# Patient Record
Sex: Male | Born: 1935 | Race: White | Hispanic: No | Smoking: Former smoker
Health system: Southern US, Community
[De-identification: ages and names within clinical notes are randomized; demographics above are authoritative.]

## PROBLEM LIST (undated history)

## (undated) DIAGNOSIS — I4891 Unspecified atrial fibrillation: Secondary | ICD-10-CM

## (undated) DIAGNOSIS — Z860100 Personal history of colon polyps, unspecified: Secondary | ICD-10-CM

## (undated) DIAGNOSIS — D49519 Neoplasm of unspecified behavior of unspecified kidney: Secondary | ICD-10-CM

## (undated) DIAGNOSIS — C73 Malignant neoplasm of thyroid gland: Secondary | ICD-10-CM

## (undated) DIAGNOSIS — J189 Pneumonia, unspecified organism: Secondary | ICD-10-CM

## (undated) DIAGNOSIS — N138 Other obstructive and reflux uropathy: Secondary | ICD-10-CM

## (undated) DIAGNOSIS — T8859XA Other complications of anesthesia, initial encounter: Secondary | ICD-10-CM

## (undated) DIAGNOSIS — G20 Parkinson's disease: Secondary | ICD-10-CM

## (undated) DIAGNOSIS — M1990 Unspecified osteoarthritis, unspecified site: Secondary | ICD-10-CM

## (undated) DIAGNOSIS — G459 Transient cerebral ischemic attack, unspecified: Secondary | ICD-10-CM

## (undated) DIAGNOSIS — M109 Gout, unspecified: Secondary | ICD-10-CM

## (undated) DIAGNOSIS — I639 Cerebral infarction, unspecified: Secondary | ICD-10-CM

## (undated) DIAGNOSIS — T4145XA Adverse effect of unspecified anesthetic, initial encounter: Secondary | ICD-10-CM

## (undated) DIAGNOSIS — S065X9A Traumatic subdural hemorrhage with loss of consciousness of unspecified duration, initial encounter: Secondary | ICD-10-CM

## (undated) DIAGNOSIS — I10 Essential (primary) hypertension: Secondary | ICD-10-CM

## (undated) DIAGNOSIS — T884XXA Failed or difficult intubation, initial encounter: Secondary | ICD-10-CM

## (undated) DIAGNOSIS — Z7901 Long term (current) use of anticoagulants: Secondary | ICD-10-CM

## (undated) DIAGNOSIS — N401 Enlarged prostate with lower urinary tract symptoms: Secondary | ICD-10-CM

## (undated) DIAGNOSIS — C4491 Basal cell carcinoma of skin, unspecified: Secondary | ICD-10-CM

## (undated) DIAGNOSIS — E039 Hypothyroidism, unspecified: Secondary | ICD-10-CM

## (undated) DIAGNOSIS — C4492 Squamous cell carcinoma of skin, unspecified: Secondary | ICD-10-CM

## (undated) DIAGNOSIS — G20A1 Parkinson's disease without dyskinesia, without mention of fluctuations: Secondary | ICD-10-CM

## (undated) DIAGNOSIS — E785 Hyperlipidemia, unspecified: Secondary | ICD-10-CM

## (undated) HISTORY — DX: Essential (primary) hypertension: I10

## (undated) HISTORY — PX: SKIN CANCER EXCISION: SHX779

## (undated) HISTORY — PX: APPENDECTOMY: SHX54

## (undated) HISTORY — DX: Cerebral infarction, unspecified: I63.9

## (undated) HISTORY — PX: KNEE ARTHROSCOPY: SUR90

## (undated) HISTORY — DX: Squamous cell carcinoma of skin, unspecified: C44.92

## (undated) HISTORY — PX: THYROIDECTOMY: SHX17

## (undated) HISTORY — DX: Transient cerebral ischemic attack, unspecified: G45.9

## (undated) HISTORY — PX: TONSILLECTOMY: SUR1361

## (undated) HISTORY — DX: Other obstructive and reflux uropathy: N13.8

## (undated) HISTORY — PX: KIDNEY SURGERY: SHX687

## (undated) HISTORY — DX: Traumatic subdural hemorrhage with loss of consciousness of unspecified duration, initial encounter: S06.5X9A

## (undated) HISTORY — DX: Personal history of colonic polyps: Z86.010

## (undated) HISTORY — PX: EYE SURGERY: SHX253

## (undated) HISTORY — PX: BACK SURGERY: SHX140

## (undated) HISTORY — PX: CATARACT EXTRACTION W/ INTRAOCULAR LENS  IMPLANT, BILATERAL: SHX1307

## (undated) HISTORY — PX: INGUINAL HERNIA REPAIR: SUR1180

## (undated) HISTORY — DX: Unspecified osteoarthritis, unspecified site: M19.90

## (undated) HISTORY — DX: Hyperlipidemia, unspecified: E78.5

## (undated) HISTORY — DX: Benign prostatic hyperplasia with lower urinary tract symptoms: N40.1

## (undated) HISTORY — DX: Malignant neoplasm of thyroid gland: C73

## (undated) HISTORY — PX: CHOLECYSTECTOMY: SHX55

## (undated) HISTORY — DX: Personal history of colon polyps, unspecified: Z86.0100

---

## 1998-05-18 ENCOUNTER — Other Ambulatory Visit: Admission: RE | Admit: 1998-05-18 | Discharge: 1998-05-18 | Payer: Self-pay | Admitting: Specialist

## 2000-09-02 ENCOUNTER — Encounter: Payer: Self-pay | Admitting: Pulmonary Disease

## 2000-09-02 ENCOUNTER — Ambulatory Visit (HOSPITAL_COMMUNITY): Admission: RE | Admit: 2000-09-02 | Discharge: 2000-09-02 | Payer: Self-pay | Admitting: Pulmonary Disease

## 2000-09-03 ENCOUNTER — Encounter: Payer: Self-pay | Admitting: Pulmonary Disease

## 2000-09-08 ENCOUNTER — Encounter: Payer: Self-pay | Admitting: Pulmonary Disease

## 2000-09-08 ENCOUNTER — Encounter (INDEPENDENT_AMBULATORY_CARE_PROVIDER_SITE_OTHER): Payer: Self-pay | Admitting: Specialist

## 2000-09-08 ENCOUNTER — Ambulatory Visit (HOSPITAL_COMMUNITY): Admission: RE | Admit: 2000-09-08 | Discharge: 2000-09-08 | Payer: Self-pay | Admitting: Pulmonary Disease

## 2000-09-29 ENCOUNTER — Encounter: Payer: Self-pay | Admitting: Surgery

## 2000-10-02 ENCOUNTER — Encounter (INDEPENDENT_AMBULATORY_CARE_PROVIDER_SITE_OTHER): Payer: Self-pay | Admitting: Specialist

## 2000-10-02 ENCOUNTER — Inpatient Hospital Stay (HOSPITAL_COMMUNITY): Admission: RE | Admit: 2000-10-02 | Discharge: 2000-10-04 | Payer: Self-pay | Admitting: Surgery

## 2001-04-19 ENCOUNTER — Emergency Department (HOSPITAL_COMMUNITY): Admission: EM | Admit: 2001-04-19 | Discharge: 2001-04-19 | Payer: Self-pay | Admitting: Emergency Medicine

## 2001-04-19 ENCOUNTER — Encounter: Payer: Self-pay | Admitting: Emergency Medicine

## 2004-02-22 ENCOUNTER — Ambulatory Visit: Payer: Self-pay | Admitting: Pulmonary Disease

## 2004-04-15 DIAGNOSIS — S065X9A Traumatic subdural hemorrhage with loss of consciousness of unspecified duration, initial encounter: Secondary | ICD-10-CM

## 2004-04-15 DIAGNOSIS — S065XAA Traumatic subdural hemorrhage with loss of consciousness status unknown, initial encounter: Secondary | ICD-10-CM

## 2004-04-15 HISTORY — PX: SUBDURAL HEMATOMA EVACUATION VIA CRANIOTOMY: SUR319

## 2004-04-15 HISTORY — DX: Traumatic subdural hemorrhage with loss of consciousness status unknown, initial encounter: S06.5XAA

## 2004-04-15 HISTORY — DX: Traumatic subdural hemorrhage with loss of consciousness of unspecified duration, initial encounter: S06.5X9A

## 2004-11-11 ENCOUNTER — Inpatient Hospital Stay (HOSPITAL_COMMUNITY): Admission: EM | Admit: 2004-11-11 | Discharge: 2004-11-16 | Payer: Self-pay | Admitting: *Deleted

## 2004-11-12 ENCOUNTER — Encounter (INDEPENDENT_AMBULATORY_CARE_PROVIDER_SITE_OTHER): Payer: Self-pay | Admitting: Specialist

## 2004-11-13 ENCOUNTER — Ambulatory Visit: Payer: Self-pay | Admitting: Pulmonary Disease

## 2004-11-26 ENCOUNTER — Ambulatory Visit (HOSPITAL_COMMUNITY): Admission: RE | Admit: 2004-11-26 | Discharge: 2004-11-26 | Payer: Self-pay | Admitting: Neurological Surgery

## 2004-12-10 ENCOUNTER — Ambulatory Visit: Payer: Self-pay | Admitting: Pulmonary Disease

## 2004-12-11 ENCOUNTER — Ambulatory Visit (HOSPITAL_COMMUNITY): Admission: RE | Admit: 2004-12-11 | Discharge: 2004-12-11 | Payer: Self-pay | Admitting: Neurosurgery

## 2005-01-14 ENCOUNTER — Ambulatory Visit (HOSPITAL_COMMUNITY): Admission: RE | Admit: 2005-01-14 | Discharge: 2005-01-14 | Payer: Self-pay | Admitting: Neurosurgery

## 2005-02-06 ENCOUNTER — Ambulatory Visit: Payer: Self-pay | Admitting: Pulmonary Disease

## 2005-02-12 ENCOUNTER — Ambulatory Visit: Payer: Self-pay | Admitting: Pulmonary Disease

## 2005-02-18 ENCOUNTER — Ambulatory Visit: Payer: Self-pay | Admitting: Pulmonary Disease

## 2005-04-24 ENCOUNTER — Ambulatory Visit: Payer: Self-pay | Admitting: Gastroenterology

## 2005-05-09 ENCOUNTER — Ambulatory Visit: Payer: Self-pay | Admitting: Gastroenterology

## 2005-05-20 ENCOUNTER — Ambulatory Visit: Payer: Self-pay | Admitting: Pulmonary Disease

## 2005-05-21 ENCOUNTER — Ambulatory Visit: Payer: Self-pay | Admitting: Pulmonary Disease

## 2005-09-24 ENCOUNTER — Ambulatory Visit: Payer: Self-pay | Admitting: Pulmonary Disease

## 2005-09-27 ENCOUNTER — Ambulatory Visit: Payer: Self-pay | Admitting: Pulmonary Disease

## 2006-02-05 ENCOUNTER — Ambulatory Visit: Payer: Self-pay | Admitting: Pulmonary Disease

## 2006-03-25 ENCOUNTER — Ambulatory Visit: Payer: Self-pay | Admitting: Pulmonary Disease

## 2006-03-25 LAB — CONVERTED CEMR LAB
ALT: 26 units/L (ref 0–40)
AST: 21 units/L (ref 0–37)
Albumin: 4 g/dL (ref 3.5–5.2)
Alkaline Phosphatase: 78 units/L (ref 39–117)
BUN: 11 mg/dL (ref 6–23)
Cholesterol: 137 mg/dL (ref 0–200)
Creatinine, Ser: 1.1 mg/dL (ref 0.4–1.5)
HDL: 47.1 mg/dL (ref >39.0)
LDL Cholesterol: 70 mg/dL (ref 0–99)
Potassium: 3.9 meq/L (ref 3.5–5.1)
Triglyceride fasting, serum: 101 mg/dL (ref 0–149)
VLDL: 20 mg/dL (ref 0–40)

## 2006-03-26 ENCOUNTER — Ambulatory Visit: Payer: Self-pay | Admitting: Pulmonary Disease

## 2006-07-15 ENCOUNTER — Ambulatory Visit: Payer: Self-pay | Admitting: Pulmonary Disease

## 2007-01-30 ENCOUNTER — Ambulatory Visit: Payer: Self-pay | Admitting: Pulmonary Disease

## 2007-04-22 DIAGNOSIS — G459 Transient cerebral ischemic attack, unspecified: Secondary | ICD-10-CM | POA: Insufficient documentation

## 2007-04-22 DIAGNOSIS — M1990 Unspecified osteoarthritis, unspecified site: Secondary | ICD-10-CM | POA: Insufficient documentation

## 2007-04-22 DIAGNOSIS — I10 Essential (primary) hypertension: Secondary | ICD-10-CM | POA: Insufficient documentation

## 2007-04-22 DIAGNOSIS — I7091 Generalized atherosclerosis: Secondary | ICD-10-CM | POA: Insufficient documentation

## 2007-04-22 DIAGNOSIS — E785 Hyperlipidemia, unspecified: Secondary | ICD-10-CM | POA: Insufficient documentation

## 2007-07-31 ENCOUNTER — Telehealth: Payer: Self-pay | Admitting: Pulmonary Disease

## 2007-09-15 ENCOUNTER — Ambulatory Visit: Payer: Self-pay | Admitting: Internal Medicine

## 2007-10-28 ENCOUNTER — Telehealth (INDEPENDENT_AMBULATORY_CARE_PROVIDER_SITE_OTHER): Payer: Self-pay | Admitting: *Deleted

## 2007-10-29 ENCOUNTER — Ambulatory Visit: Payer: Self-pay | Admitting: Pulmonary Disease

## 2007-10-30 ENCOUNTER — Telehealth (INDEPENDENT_AMBULATORY_CARE_PROVIDER_SITE_OTHER): Payer: Self-pay | Admitting: *Deleted

## 2007-12-10 ENCOUNTER — Ambulatory Visit: Payer: Self-pay | Admitting: Pulmonary Disease

## 2007-12-10 DIAGNOSIS — C73 Malignant neoplasm of thyroid gland: Secondary | ICD-10-CM | POA: Insufficient documentation

## 2007-12-10 DIAGNOSIS — N400 Enlarged prostate without lower urinary tract symptoms: Secondary | ICD-10-CM | POA: Insufficient documentation

## 2007-12-10 DIAGNOSIS — C4490 Unspecified malignant neoplasm of skin, unspecified: Secondary | ICD-10-CM | POA: Insufficient documentation

## 2007-12-10 DIAGNOSIS — I6200 Nontraumatic subdural hemorrhage, unspecified: Secondary | ICD-10-CM | POA: Insufficient documentation

## 2007-12-10 LAB — CONVERTED CEMR LAB
ALT: 17 units/L (ref 0–53)
BUN: 16 mg/dL (ref 6–23)
Basophils Absolute: 0 10*3/uL (ref 0.0–0.1)
CO2: 34 meq/L — ABNORMAL HIGH (ref 19–32)
Calcium: 8.6 mg/dL (ref 8.4–10.5)
Chloride: 97 meq/L (ref 96–112)
Cholesterol: 196 mg/dL (ref 0–200)
Eosinophils Absolute: 0.2 10*3/uL (ref 0.0–0.7)
Glucose, Bld: 100 mg/dL — ABNORMAL HIGH (ref 70–99)
HCT: 44.4 % (ref 39.0–52.0)
HDL: 48.7 mg/dL (ref >39.0)
LDL Cholesterol: 128 mg/dL — ABNORMAL HIGH (ref 0–99)
Monocytes Absolute: 0.4 10*3/uL (ref 0.1–1.0)
Neutrophils Relative %: 57.2 % (ref 43.0–77.0)
Platelets: 165 10*3/uL (ref 150–400)
Potassium: 3.9 meq/L (ref 3.5–5.1)
RBC: 4.7 M/uL (ref 4.22–5.81)
Total Bilirubin: 1.1 mg/dL (ref 0.3–1.2)
Total Protein: 6.8 g/dL (ref 6.0–8.3)
WBC: 3.4 10*3/uL — ABNORMAL LOW (ref 4.5–10.5)

## 2008-01-06 ENCOUNTER — Ambulatory Visit: Payer: Self-pay | Admitting: Pulmonary Disease

## 2008-08-03 ENCOUNTER — Telehealth: Payer: Self-pay | Admitting: Pulmonary Disease

## 2008-09-26 ENCOUNTER — Telehealth: Payer: Self-pay | Admitting: Pulmonary Disease

## 2008-10-04 ENCOUNTER — Telehealth (INDEPENDENT_AMBULATORY_CARE_PROVIDER_SITE_OTHER): Payer: Self-pay | Admitting: *Deleted

## 2008-10-06 ENCOUNTER — Telehealth: Payer: Self-pay | Admitting: Pulmonary Disease

## 2008-10-24 ENCOUNTER — Telehealth: Payer: Self-pay | Admitting: Pulmonary Disease

## 2008-10-27 ENCOUNTER — Telehealth: Payer: Self-pay | Admitting: Pulmonary Disease

## 2008-10-28 ENCOUNTER — Ambulatory Visit: Payer: Self-pay | Admitting: Pulmonary Disease

## 2008-11-07 ENCOUNTER — Ambulatory Visit: Payer: Self-pay | Admitting: Pulmonary Disease

## 2008-11-07 LAB — CONVERTED CEMR LAB
ALT: 14 units/L (ref 0–53)
AST: 19 units/L (ref 0–37)
Albumin: 3.9 g/dL (ref 3.5–5.2)
Alkaline Phosphatase: 70 units/L (ref 39–117)
Basophils Absolute: 0 10*3/uL (ref 0.0–0.1)
Basophils Relative: 0.5 % (ref 0.0–3.0)
CO2: 34 meq/L — ABNORMAL HIGH (ref 19–32)
Calcium: 8.3 mg/dL — ABNORMAL LOW (ref 8.4–10.5)
Chloride: 104 meq/L (ref 96–112)
Cholesterol: 135 mg/dL (ref 0–200)
Creatinine, Ser: 1.3 mg/dL (ref 0.4–1.5)
Eosinophils Absolute: 0.2 10*3/uL (ref 0.0–0.7)
HCT: 45.7 % (ref 39.0–52.0)
Hemoglobin, Urine: NEGATIVE
LDL Cholesterol: 73 mg/dL (ref 0–99)
Leukocytes, UA: NEGATIVE
Lymphocytes Relative: 26.3 % (ref 12.0–46.0)
Lymphs Abs: 1.1 10*3/uL (ref 0.7–4.0)
Neutro Abs: 2.4 10*3/uL (ref 1.4–7.7)
Nitrite: NEGATIVE
Platelets: 167 10*3/uL (ref 150.0–400.0)
Potassium: 4.1 meq/L (ref 3.5–5.1)
RBC: 4.84 M/uL (ref 4.22–5.81)
RDW: 12.8 % (ref 11.5–14.6)
TSH: 2.95 microintl units/mL (ref 0.35–5.50)
Total CHOL/HDL Ratio: 3
Total Protein, Urine: NEGATIVE mg/dL
Triglycerides: 47 mg/dL (ref 0.0–149.0)
WBC: 4.2 10*3/uL — ABNORMAL LOW (ref 4.5–10.5)
pH: 6 (ref 5.0–8.0)

## 2009-01-27 ENCOUNTER — Ambulatory Visit: Payer: Self-pay | Admitting: Pulmonary Disease

## 2009-05-09 ENCOUNTER — Ambulatory Visit: Payer: Self-pay | Admitting: Pulmonary Disease

## 2009-06-16 ENCOUNTER — Ambulatory Visit: Payer: Self-pay | Admitting: Pulmonary Disease

## 2009-06-16 ENCOUNTER — Telehealth (INDEPENDENT_AMBULATORY_CARE_PROVIDER_SITE_OTHER): Payer: Self-pay | Admitting: *Deleted

## 2009-10-17 ENCOUNTER — Encounter: Admission: RE | Admit: 2009-10-17 | Discharge: 2009-10-17 | Payer: Self-pay | Admitting: Neurology

## 2009-10-27 ENCOUNTER — Telehealth (INDEPENDENT_AMBULATORY_CARE_PROVIDER_SITE_OTHER): Payer: Self-pay | Admitting: *Deleted

## 2009-10-31 ENCOUNTER — Ambulatory Visit: Payer: Self-pay | Admitting: Pulmonary Disease

## 2009-10-31 LAB — CONVERTED CEMR LAB
BUN: 19 mg/dL (ref 6–23)
Basophils Absolute: 0 10*3/uL (ref 0.0–0.1)
Basophils Relative: 1.1 % (ref 0.0–3.0)
CO2: 34 meq/L — ABNORMAL HIGH (ref 19–32)
Chloride: 104 meq/L (ref 96–112)
Creatinine, Ser: 1.3 mg/dL (ref 0.4–1.5)
Eosinophils Absolute: 0.1 10*3/uL (ref 0.0–0.7)
Eosinophils Relative: 3.4 % (ref 0.0–5.0)
GFR calc non Af Amer: 57.54 mL/min (ref >60)
Glucose, Bld: 101 mg/dL — ABNORMAL HIGH (ref 70–99)
HCT: 41.8 % (ref 39.0–52.0)
MCHC: 34.7 g/dL (ref 30.0–36.0)
MCV: 93.2 fL (ref 78.0–100.0)
Platelets: 159 10*3/uL (ref 150.0–400.0)
Potassium: 4.1 meq/L (ref 3.5–5.1)
RBC: 4.48 M/uL (ref 4.22–5.81)
Sodium: 142 meq/L (ref 135–145)

## 2009-11-30 ENCOUNTER — Ambulatory Visit: Payer: Self-pay | Admitting: Pulmonary Disease

## 2009-12-02 LAB — CONVERTED CEMR LAB
ALT: 14 units/L (ref 0–53)
AST: 18 units/L (ref 0–37)
Albumin: 4.4 g/dL (ref 3.5–5.2)
Alkaline Phosphatase: 54 units/L (ref 39–117)
TSH: 1.2 microintl units/mL (ref 0.35–5.50)
Total CHOL/HDL Ratio: 3

## 2010-01-31 ENCOUNTER — Ambulatory Visit: Payer: Self-pay | Admitting: Pulmonary Disease

## 2010-05-15 NOTE — Progress Notes (Signed)
Summary: low bp  Phone Note Call from Patient   Caller: Patient Call For: nadel Summary of Call: pt c/o low bp. requests to speak to nurse. today's readings: 80 / 43 at 10:00am/ 103 / 57 at 2:00pm today. pt says that dr love changed some of his meds and since pt is on HYZAAR he thinks he may need to change this. call cell [phone] Initial call taken by: Tivis Ringer, CNA,  October 27, 2009 2:26 PM  Follow-up for Phone Call        Called, spoke with pt.  Pt states Dr. Sandria Manly started him on Mirapex 0.125mg .  Pt is currently up to four tablets daily-taking 2 im am, 1 at noon, and 1 at bedtime.  States since he has started taking 4 tablets, he has noticed BP is "significantly lower than normal."  States he is also having wakness and feeling like he is going to pass out while standing since on 4 tablets.  BP at 10am today 109/59, 1130 80/43, 2:00pm 103/57.  Pt would like to know if he should eliminate or decrease either the hyzaar or cardura.   Will forward to Tp in the absence of SN.  Pls advise.  Thanks! Follow-up by: Gweneth Dimitri RN,  October 27, 2009 3:02 PM  Additional Follow-up for Phone Call Additional follow up Details #1::        hold hyzaar, check b/p daily , restart when b/p is >140/90.  ov next week to evaluate.  if b/p is still low, will need to stop mirapex.  if not improving will need to go to ER.  increase fluds. Additional Follow-up by: Rubye Oaks NP,  October 27, 2009 3:06 PM    Additional Follow-up for Phone Call Additional follow up Details #2::    Called, spoke with pt.  Pt informed of the above recs per TP.  OV scheduled with TP for July 19 at 1145.  Pt advised to bring BP log with him to OV. He verbalized understanding of all instructions.   Follow-up by: Gweneth Dimitri RN,  October 27, 2009 3:20 PM

## 2010-05-15 NOTE — Assessment & Plan Note (Signed)
Summary: Acute NP office visit - poss gout   CC:  states woke up this morning with sharp pain in the left great toe that has become intermittent since.  pt denies any redness/swelling.Marland Kitchen  History of Present Illness: 77  year old male with known history of DJD  June 16, 2009--Presents for an acute office visit. Complains of toe pain. States he woke up this morning with sharp pain in the left great toe that has become intermittent since.  pt denies any redness/swelling. left great toe. Recently went for pedicure. Denies chest pain, dyspnea, orthopnea, hemoptysis, fever, n/v/d, edema, headache.   Medications Prior to Update: 1)  Bayer Low Strength 81 Mg  Tbec (Aspirin) .... Take 1 Tablet By Mouth Once A Day 2)  Hyzaar 100-25 Mg  Tabs (Losartan Potassium-Hctz) .... Take 1 Tablet By Mouth Once A Day 3)  Cardura 8 Mg  Tabs (Doxazosin Mesylate) .... Take 1/2 Tablet By Mouth Once A Day 4)  Simvastatin 40 Mg Tabs (Simvastatin) .... Take 1 Tab By Mouth At Bedtime 5)  Fish Oil 1000 Mg  Caps (Omega-3 Fatty Acids) .... Take 1 Capsule By Mouth Once A Day 6)  Synthroid 200 Mcg  Tabs (Levothyroxine Sodium) .... Take 1 Tablet By Mouth Once A Day 7)  Dilantin 100 Mg  Caps (Phenytoin Sodium Extended) .... Take 3 Tablets Daily 8)  Cialis 20 Mg Tabs (Tadalafil) .... Use As Directed 9)  Multivitamins   Tabs (Multiple Vitamin) .... Take 1 Tablet By Mouth Once A Day  Current Medications (verified): 1)  Bayer Low Strength 81 Mg  Tbec (Aspirin) .... Take 1 Tablet By Mouth Once A Day 2)  Hyzaar 100-25 Mg  Tabs (Losartan Potassium-Hctz) .... Take 1 Tablet By Mouth Once A Day 3)  Cardura 8 Mg  Tabs (Doxazosin Mesylate) .... Take 1/2 Tablet By Mouth Once A Day 4)  Simvastatin 40 Mg Tabs (Simvastatin) .... Take 1 Tab By Mouth At Bedtime 5)  Fish Oil 1000 Mg  Caps (Omega-3 Fatty Acids) .... Take 1 Capsule By Mouth Once A Day 6)  Synthroid 200 Mcg  Tabs (Levothyroxine Sodium) .... Take 1 Tablet By Mouth Once A Day 7)   Dilantin 100 Mg  Caps (Phenytoin Sodium Extended) .... 2 Capsules By Mouth in The Morning and 1 At Bedtime 8)  Cialis 20 Mg Tabs (Tadalafil) .... Use As Directed 9)  Multivitamins   Tabs (Multiple Vitamin) .... 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) COLONIC POLYPS (ICD-211.3) BENIGN PROSTATIC HYPERTROPHY, WITH OBSTRUCTION (ICD-600.01) TRANSIENT ISCHEMIC ATTACK (ICD-435.9) Hx of SUBDURAL HEMATOMA (ICD-432.1) TREMOR (ICD-781.0) DEGENERATIVE JOINT DISEASE (ICD-715.90) Hx of CARCINOMA, SKIN, SQUAMOUS CELL (ICD-173.9)  Past Surgical History: Last updated: 05/09/2009 S/P cataract surgery S/P cholecystectomy S/P right inguinal hernia repair S/P thyroidectomy for medullary carcinoma  Family History: Last updated: 14-Oct-2007 mother deceased at age 50, stroke father deceased at age 70, alzheimer's bother living at 18 with alzheimer's sister living at 45 with alzheimer's  Social History: Last updated: 12/06/2008 Widow- wife died March 23, 2023 w/ metastatic breast cancer 3 children  former smoker for 60yrs 1 drink daily retired  Risk Factors: Smoking Status: quit (04/22/2007)  Review of Systems      See HPI  Vital Signs:  Patient profile:   77 year old male Height:  76 inches Weight:      190.13 pounds BMI:     23.23 O2 Sat:      98 % on Room air Temp:     97.3 degrees F oral Pulse rate:   56 / minute BP sitting:   126 / 80  (right arm) Cuff size:   regular  Vitals Entered By: Boone Master CNA (June 16, 2009 12:01 PM)  O2 Flow:  Room air CC: states woke up this morning with sharp pain in the left great toe that has become intermittent since.  pt denies any redness/swelling. Is Patient Diabetic? No Comments Medications  reviewed with patient Daytime contact number verified with patient. Boone Master CNA  June 16, 2009 12:01 PM    Physical Exam  Additional Exam:  WD, WN, 77 y/o WM in NAD... GENERAL:  Alert & oriented; pleasant & cooperative... HEENT:  [place]/AT, , EACs-clear, TMs-wnl, NOSE-clear, THROAT-clear & wnl. NECK:  Supple w/ fairROM; no JVD; normal carotid impulses w/o bruits; thyroid area scar, no nodules palp; no lymphadenopathy. CHEST:  Clear to P & A; without wheezes/ rales/ or rhonchi heard... HEART:  Regular Rhythm; without murmurs/ rubs/ or gallops detected... ABDOMEN:  Soft & nontender; normal bowel sounds; no organomegaly or masses palpated... EXT: without deformities, mild arthritic changes; no varicose veins/ +venous insuffic/ no edema, along left great toe, no swelling, along cuticle slightly red, no streaking.      Impression & Recommendations:  Problem # 1:  TOE PAIN (ICD-729.5)  ?localized irritation along cuticle of left great toe REC:  Warm epson salt soaks once daily for 1 week  Place neosporin along cuticle /nail bed once daily for 1 week.  Call back if redness or swelling develops Tylneol or motrin as needed pain Please contact office for sooner follow up if symptoms do not improve or worsen  follow up Dr. Kriste Basque as scheduled.   Orders: Est. Patient Level III (65784)  Medications Added to Medication List This Visit: 1)  Dilantin 100 Mg Caps (Phenytoin sodium extended) .... 2 capsules by mouth in the morning and 1 at bedtime  Patient Instructions: 1)  Warm epson salt soaks once daily for 1 week  2)  Place neosporin along cuticle /nail bed once daily for 1 week.  3)  Call back if redness or swelling develops 4)  Tylneol or motrin as needed pain 5)  Please contact office for sooner follow up if symptoms do not improve or worsen  6)  follow up Dr. Kriste Basque as scheduled.

## 2010-05-15 NOTE — Assessment & Plan Note (Signed)
Summary: f/u 6 months///kp   CC:  6 month ROV & review of mult medalproblems....  History of Present Illness: 77 y/o WM here for a follow up visit... he has multiple medical problems as noted below... followed for HBP, Carotid vasc dis w/ hx of TIA & prev subdural hematoma, Chol, & prev surg for medullary thyroid carcinoma... his wife passed away from metastatic breast cancer 11/09...   ~  November 07, 2008:  here for yearly follow up and refill of meds... states he is doing well, plays golf regularly, notes sl tremor of right hand and arm, hasn't seen DrLove in several yrs and will call for follow up eval... he saw DrRDavis recently w/ incr PSA>5 & Rx w/ Cipro... PSA here is 4.17, and we will send to Urology attention...   ~  May 09, 2009:  6 mo ROV doing well- BP, Chol, Thyroid all doing satis... he saw DrLove w/ tremor right hand & he favored prob early Parkinson's... they decided to watch it- no meds yet...  he slipped on the ice several weeks ago- no apparent injury... had Flu vaccine 10/10, & we discussed PNEUMONIA vaccination today.    Current Problem List:  HYPERTENSION (ICD-401.9) - controlled on HYZAAR 100/25 daily, & CARDURA 8mg - taking 1/2 daily...  BP= 120/72 here & similar at home... feeling well- denies HA, fatigue, visual changes, CP, palipit, dizziness, syncope, dyspnea, edema, etc...  ATHEROSCLEROTIC VASCULAR DISEASE (ICD-440.9) - on ASA 81mg /d... he denies weakness, numbness, speech difficulties, etc...  ~  Eval 2003 by [facility] for TIA showed cerebrovasc dis w/ R>L PCA stenoses...  HYPERLIPIDEMIA (ICD-272.4) - on SIMVASTATIN 40mg /d + FISH OIL 1000mg /d...   ~  FLP 12/07 on Zocor showed TChol 137, HDL 47, LDL 70  ~  FLP 7/09 off med showed TChol 196, TG 95, HDL 49, LDL 128... rec> restart Zocor 40mg .  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 he is s/p thyroidectomy for medullary carcinoma of  the thyroid 6/02 by [facility]...  ~  labs 7/09 w/ TSH= 3.47  ~  labs 7/10 showed TSH= 2.95  COLONIC POLYPS (ICD-211.3) - last colonoscopy 1/07 by DrSam showed divertics & hems only... f/u planned 65yrs.  BENIGN PROSTATIC HYPERTROPHY, WITH OBSTRUCTION (ICD-600.01) - he sees DrDavis for Urology once a year... he has a brother who had prostate cancer...   ~  PSA followed by [facility] yrearly... 7/10 pt reports PSA>5 (was 4.17 here) & Rx w/ Cipro...  ~  ?DrDavis leaving for [facility]- pt will f/u soon & be reassigned.  TRANSIENT ISCHEMIC ATTACK (ICD-435.9) - hx of recurrent right brain TIA's in 2003... initially on ASA + Plavix and the Plavix was stopped after his subdural in 2006...  Hx of SUBDURAL HEMATOMA (ICD-432.1) - hosp 7/06 w/ left subdural hematoma req craniotomy by DrHirsh for evacuation... no known trauma- he was on ASA/ Plavix and developed a headache... he also take DILANTIN 100mg  Tid now per [facility] (for a partial seizure characterized by aphasia)...  ~  7/10: pt tells me he has decreased his Dilantin to Bid and will f/u w/ DrLove.  TREMOR (ICD-781.0) - he notes mild tremor right hand & arm... eval from Neurology- DrLove- indicated prob early [name] but they opted for observation, no meds yet...  DEGENERATIVE JOINT DISEASE (ICD-715.90)  Hx of CARCINOMA, SKIN, SQUAMOUS CELL (ICD-173.9) - s/p removal of skin cancer from his right forearm 11/06 by  DrLupton...    Allergies (verified): No Known Drug Allergies  Comments:  Nurse/Medical Assistant: The patient's medications and allergies were reviewed with the patient and were updated in the Medication and Allergy Lists.  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 mother deceased at age 82, stroke father deceased at age 75, alzheimer's bother living at 30 with alzheimer's sister living at 7 with alzheimer's  Social History: Reviewed history from 11/07/2008 and no changes required. Widow- wife died 03/20/23 w/ metastatic breast cancer 3 children  former smoker for 54yrs 1 drink daily retired  Review of Systems      See HPI  The patient denies anorexia, fever, weight loss, weight gain, vision loss, decreased hearing, hoarseness, chest pain, syncope, dyspnea on exertion, peripheral edema, prolonged cough, headaches, hemoptysis, abdominal pain, melena, hematochezia, severe indigestion/heartburn, hematuria, incontinence, muscle weakness, suspicious skin lesions, transient blindness, difficulty walking, depression, unusual weight change, abnormal bleeding, enlarged lymph nodes, and angioedema.    Vital Signs:  Patient profile:   77 year old male Height:      76 inches Weight:      189.50 pounds BMI:     23.15 O2 Sat:      96 % on Room air Temp:     97.4 degrees F oral Pulse rate:   62 / minute BP sitting:   120 / 72  (left arm) Cuff size:   regular  Vitals Entered By: Randell Loop CMA (May 09, 2009 11:56 AM)  O2 Sat at Rest %:  96 O2 Flow:  Room air CC: 6 month ROV & review of mult medalproblems... Is Patient Diabetic? No Pain Assessment Patient in pain? no      Comments no changes in meds   Physical Exam  Additional Exam:  WD, WN, 77 y/o WM in NAD... GENERAL:  Alert & oriented; pleasant & cooperative... HEENT:  [place]/AT, EOM-wnl, PERRLA, EACs-clear,  TMs-wnl, NOSE-clear, THROAT-clear & wnl. NECK:  Supple w/ fairROM; no JVD; normal carotid impulses w/o bruits; thyroid area scar, no nodules palp; no lymphadenopathy. CHEST:  Clear to P & A; without wheezes/ rales/ or rhonchi heard... HEART:  Regular Rhythm; without murmurs/ rubs/ or gallops detected... ABDOMEN:  Soft & nontender; normal bowel sounds; no organomegaly or masses palpated... EXT: without deformities, mild arthritic changes; no varicose veins/ +venous insuffic/ no edema. NEURO:  CN's intact;  mild tremor right hand & arm when distracted, ?early mask facies, still moves well. DERM:  No lesions noted; no rash etc...     Impression & Recommendations:  Problem # 1:  HYPERTENSION (ICD-401.9) Controlled-  same meds. His updated medication list for this problem includes:    Hyzaar 100-25 Mg Tabs (Losartan potassium-hctz) .Marland Kitchen... Take 1 tablet by mouth once a day    Cardura 8 Mg Tabs (Doxazosin mesylate) .Marland Kitchen... Take 1/2 tablet by mouth once a day  Problem # 2:  ATHEROSCLEROTIC VASCULAR DISEASE (ICD-440.9) He will continue ASA alone...  Problem # 3:  HYPERLIPIDEMIA (ICD-272.4) Stable on Simva40 + Fish Oil... His updated medication list for this problem includes:  Simvastatin 40 Mg Tabs (Simvastatin) .Marland Kitchen... 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  Problem # 5:  BENIGN PROSTATIC HYPERTROPHY, WITH OBSTRUCTION (ICD-600.01) Followed by [facility]... he will check in soon for PSA & DRE... His updated medication list for this problem includes:    Cardura 8 Mg Tabs (Doxazosin mesylate) .Marland Kitchen... Take 1/2 tablet by mouth once a day  Problem # 6:  TREMOR (ICD-781.0) He has seen DrLove- & will call for f/u appt...  Problem # 7:  OTHER MEDICAL PROBLEMS AS NOTED>>>  Complete Medication List: 1)  Bayer Low Strength 81 Mg Tbec (Aspirin) .... Take 1 tablet by mouth once a day 2)  Hyzaar 100-25 Mg Tabs  (Losartan potassium-hctz) .... Take 1 tablet by mouth once a day 3)  Cardura 8 Mg Tabs (Doxazosin mesylate) .... Take 1/2 tablet by mouth once a day 4)  Simvastatin 40 Mg Tabs (Simvastatin) .... Take 1 tab by mouth at bedtime 5)  Fish Oil 1000 Mg Caps (Omega-3 fatty acids) .... Take 1 capsule by mouth once a day 6)  Synthroid 200 Mcg Tabs (Levothyroxine sodium) .... Take 1 tablet by mouth once a day 7)  Dilantin 100 Mg Caps (Phenytoin sodium extended) .... Take 3 tablets daily 8)  Cialis 20 Mg Tabs (Tadalafil) .... Use as directed 9)  Multivitamins Tabs (Multiple vitamin) .... Take 1 tablet by mouth once a day  Patient Instructions: 1)  Today we updated your med list- see below.... 2)  Continue your current meds the same... 3)  Today we gave you a PNEUMOVAX... this is the last one you will need based on the current recommendations. 4)  Call for any problems.Marland KitchenMarland Kitchen 5)  Please schedule a follow-up appointment in 6 months, and we will plan FASTING blood work at that time.

## 2010-05-15 NOTE — Miscellaneous (Signed)
Summary: INJECTION GIVEN AT OV TODAY   Clinical Lists Changes  Orders: Added new Service order of Pneumococcal Vaccine (16109) - Signed Added new Service order of Admin 1st Vaccine (60454) - Signed Observations: Added new observation of PNEUMOVAXVIS: 11/11/95 version given May 09, 2009. (05/09/2009 14:26) Added new observation of PNEUMOVAXLOT: 111OZ (05/09/2009 14:26) Added new observation of PNEUMOVAXEXP: 05/11/2010 (05/09/2009 14:26) Added new observation of PNEUMOVAXBY: Randell Loop CMA (05/09/2009 14:26) Added new observation of PNEUMOVAXRTE: IM (05/09/2009 14:26) Added new observation of PNEUMOVAXDOS: 0.5 ml (05/09/2009 14:26) Added new observation of PNEUMOVAXMFR: Merck (05/09/2009 14:26) Added new observation of PNEUMOVAXSIT: left deltoid (05/09/2009 14:26) Added new observation of PNEUMOVAX: Pneumovax (05/09/2009 14:26)      Orders Added: 1)  Pneumococcal Vaccine [90732] 2)  Admin 1st Vaccine [09811]    Immunizations Administered:  Pneumonia Vaccine:    Vaccine Type: Pneumovax    Site: left deltoid    Mfr: Merck    Dose: 0.5 ml    Route: IM    Given by: Randell Loop CMA    Exp. Date: 05/11/2010    Lot #: [id number]    VIS given: 11/11/95 version given May 09, 2009.

## 2010-05-15 NOTE — Assessment & Plan Note (Signed)
Summary: flu shot/jd   Nurse Visit   Allergies: No Known Drug Allergies  Orders Added: 1)  Flu Vaccine 80yrs + MEDICARE PATIENTS [Q2039] 2)  Administration Flu vaccine - MCR [G0008]   Flu Vaccine Consent Questions     Do you have a history of severe allergic reactions to this vaccine? no    Any prior history of allergic reactions to egg and/or gelatin? no    Do you have a sensitivity to the preservative Thimersol? no    Do you have a past history of Guillan-Barre Syndrome? no    Do you currently have an acute febrile illness? no    Have you ever had a severe reaction to latex? no    Vaccine information given and explained to patient? yes    Are you currently pregnant? no    Lot Number:[id number]   Exp Date:10/13/2010   Site Given  Left Deltoid IM1  Clarise Cruz [facility])  February 01, 2010 11:48 AM

## 2010-05-15 NOTE — Assessment & Plan Note (Signed)
Summary: NP follow up - hypotention   CC:  1 week follow up hypotension - states BP's have improved since holding the hyzaar but has not restarted this med.  still taking the mirapex.  History of Present Illness: 77  year old male with known history of DJD  June 16, 2009--Presents for an acute office visit. Complains of toe pain. States he woke up this morning with sharp pain in the left great toe that has become intermittent since.  pt denies any redness/swelling. left great toe. Recently went for pedicure.   October 31, 2009--Presents for 1 week follow up hypotension - states BP's have improved since holding the hyzaar but has not restarted this med.  still taking the mirapex. Was seen by Dr. Sandria Manly  for tremor, rx on Mirapex. After starting on Mirapex b/p dropped down in 80s. Felt weak. He was instructed to hold Hyzaar. b/p is improved and over last few days b/p 130-140. Feeling better. Denies chest pain, dyspnea, orthopnea, hemoptysis, fever, n/v/d, edema, headache. Back to playing golf .   Medications Prior to Update: 1)  Bayer Low Strength 81 Mg  Tbec (Aspirin) .... Take 1 Tablet By Mouth Once A Day 2)  Hyzaar 100-25 Mg  Tabs (Losartan Potassium-Hctz) .... Take 1 Tablet By Mouth Once A Day 3)  Cardura 8 Mg  Tabs (Doxazosin Mesylate) .... Take 1/2 Tablet By Mouth Once A Day 4)  Simvastatin 40 Mg Tabs (Simvastatin) .... Take 1 Tab By Mouth At Bedtime 5)  Fish Oil 1000 Mg  Caps (Omega-3 Fatty Acids) .... Take 1 Capsule By Mouth Once A Day 6)  Synthroid 200 Mcg  Tabs (Levothyroxine Sodium) .... Take 1 Tablet By Mouth Once A Day 7)  Dilantin 100 Mg  Caps (Phenytoin Sodium Extended) .... 2 Capsules By Mouth in The Morning and 1 At Bedtime 8)  Cialis 20 Mg Tabs (Tadalafil) .... Use As Directed 9)  Multivitamins   Tabs (Multiple Vitamin) .... Take 1 Tablet By Mouth Once A Day  Current Medications (verified): 1)  Bayer Low Strength 81 Mg  Tbec (Aspirin) .... Take 1 Tablet By Mouth Once A Day 2)   Hyzaar 100-25 Mg  Tabs (Losartan Potassium-Hctz) .... Take 1 Tablet By Mouth Once A Day 3)  Cardura 8 Mg  Tabs (Doxazosin Mesylate) .... Take 1/2 Tablet By Mouth Once A Day 4)  Simvastatin 40 Mg Tabs (Simvastatin) .... Take 1 Tab By Mouth At Bedtime 5)  Fish Oil 1000 Mg  Caps (Omega-3 Fatty Acids) .... Take 1 Capsule By Mouth Once A Day 6)  Synthroid 200 Mcg  Tabs (Levothyroxine Sodium) .... Take 1 Tablet By Mouth Once A Day 7)  Dilantin 100 Mg  Caps (Phenytoin Sodium Extended) .... 2 Capsules By Mouth in The Morning and 1 At Bedtime 8)  Cialis 20 Mg Tabs (Tadalafil) .... Use As Directed 9)  Multivitamins   Tabs (Multiple Vitamin) .... Take 1 Tablet By Mouth Once A Day 10)  Metamucil 30.9 %  Powd (Psyllium) 11)  Mirapex 0.125 Mg Tabs (Pramipexole Dihydrochloride) .... 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) COLONIC POLYPS (ICD-211.3) BENIGN PROSTATIC HYPERTROPHY, WITH OBSTRUCTION (ICD-600.01) TRANSIENT ISCHEMIC ATTACK (ICD-435.9) Hx of SUBDURAL HEMATOMA (ICD-432.1) TREMOR (ICD-781.0) DEGENERATIVE JOINT DISEASE (ICD-715.90) Hx of CARCINOMA, SKIN, SQUAMOUS CELL (  ICD-173.9)  Past Surgical History: Last updated: 05/09/2009 S/P cataract surgery S/P cholecystectomy S/P right inguinal hernia repair S/P thyroidectomy for medullary carcinoma  Family History: Last updated: 2007/09/17 mother deceased at age 6, stroke father deceased at age 65, alzheimer's bother living at 57 with alzheimer's sister living at 61 with alzheimer's  Social History: Last updated: 2008/11/09 Widow- wife died 02-24-23 w/ metastatic breast cancer 3 children  former smoker for 51yrs 1 drink daily retired  Risk Factors: Smoking Status:  quit (04/22/2007)  Review of Systems      See HPI  Vital Signs:  Patient profile:   77 year old male Height:      76 inches Weight:      190 pounds BMI:     23.21 O2 Sat:      97 % on Room air Temp:     97.4 degrees F oral Pulse rate:   52 / minute BP sitting:   116 / 82  (right arm) Cuff size:   regular  Vitals Entered By: Boone Master CNA/MA (October 31, 2009 11:51 AM)  O2 Flow:  Room air CC: 1 week follow up hypotension - states BP's have improved since holding the hyzaar but has not restarted this med.  still taking the mirapex Is Patient Diabetic? No Comments Medications reviewed with patient Daytime contact number verified with patient. Boone Master CNA/MA  October 31, 2009 11:50 AM    Physical Exam  Additional Exam:  WD, WN, 77 y/o WM in NAD... GENERAL:  Alert & oriented; pleasant & cooperative... HEENT:  [place]/AT, , EACs-clear, TMs-wnl, NOSE-clear, THROAT-clear & wnl. NECK:  Supple w/ fairROM; no JVD; normal carotid impulses w/o bruits; thyroid area scar, no nodules palp; no lymphadenopathy. CHEST:  Clear to P & A; without wheezes/ rales/ or rhonchi heard... HEART:  Regular Rhythm; without murmurs/ rubs/ or gallops detected... ABDOMEN:  Soft & nontender; normal bowel sounds; no organomegaly or masses palpated... EXT: without deformities, mild arthritic changes; no varicose veins/ +venous insuffic/ no edema     Impression & Recommendations:  Problem # 1:  HYPERTENSION (ICD-401.9) Overcompensated w/ addition of Mirapex  b/p is improving w/ holding hyzaar -on avg 130-140systolic will add back in but at lower dose REC: cont w/ b/p log. , labs pending.  If b/p remains good for next week, may restart Hyzaar but at 1/2 once daily .  low salt diet I will call with labs.  follow up Dr. Kriste Basque in 1 month and as needed  Please contact office for sooner follow up if symptoms do not improve or worsen  His updated medication list for this problem includes:    Hyzaar 100-25 Mg  Tabs (Losartan potassium-hctz) .Marland Kitchen... Take 1/2 tablet by mouth once a day    Cardura 8 Mg Tabs (Doxazosin mesylate) .Marland Kitchen... Take 1/2 tablet by mouth once a day  Orders: TLB-CBC Platelet - w/Differential (85025-CBCD) TLB-BMP (Basic Metabolic Panel-BMET) (80048-METABOL) Est. Patient Level IV (16109)  Medications Added to Medication List This Visit: 1)  Hyzaar 100-25 Mg Tabs (Losartan potassium-hctz) .... Take 1/2 tablet by mouth once a day 2)  Metamucil 30.9 % Powd (Psyllium) 3)  Mirapex 0.125 Mg Tabs (Pramipexole dihydrochloride) .... 2 tabs by mouth  every morning, 1 at noon and 1 at bedtime  Complete Medication List: 1)  Bayer Low Strength 81 Mg Tbec (Aspirin) .... Take 1 tablet by mouth once a day 2)  Hyzaar 100-25 Mg Tabs (Losartan potassium-hctz) .... Take 1/2 tablet by mouth once a day 3)  Cardura 8 Mg Tabs (Doxazosin mesylate) .... Take 1/2 tablet by mouth once a day 4)  Simvastatin 40 Mg Tabs (Simvastatin) .... Take 1 tab by mouth at bedtime 5)  Fish Oil 1000 Mg Caps (Omega-3 fatty acids) .... Take 1 capsule by mouth once a day 6)  Synthroid 200 Mcg Tabs (Levothyroxine sodium) .... Take 1 tablet by mouth once a day 7)  Dilantin 100 Mg Caps (Phenytoin sodium extended) .... 2 capsules by mouth in the morning and 1 at bedtime 8)  Cialis 20 Mg Tabs (Tadalafil) .... Use as directed 9)  Multivitamins Tabs (Multiple vitamin) .... Take 1 tablet by mouth once a day 10)  Metamucil 30.9 % Powd (Psyllium) 11)  Mirapex 0.125 Mg Tabs (Pramipexole dihydrochloride) .... 2 tabs by mouth  every morning, 1 at noon and 1 at bedtime  Patient Instructions: 1)  If b/p remains good for next week, may restart Hyzaar but at 1/2 once daily .  2)  low salt diet 3)  I will call with labs.  4)  follow up Dr. Kriste Basque in 1 month and as needed  5)  Please contact office for sooner follow up if symptoms do not improve or worsen

## 2010-05-15 NOTE — Assessment & Plan Note (Signed)
Summary: 6 months/apc   CC:  7 month ROV & review of mult medical problems....  History of Present Illness: 77 y/o WM here for a follow up visit... he has multiple medical problems as noted below... followed for HBP, Carotid vasc dis w/ hx of TIA & prev subdural hematoma, Chol, & prev surg for medullary thyroid carcinoma... his wife passed away from metastatic breast cancer 11/09...   ~  November 07, 2008:  here for yearly follow up and refill of meds... states he is doing well, plays golf regularly, notes sl tremor of right hand and arm, hasn't seen DrLove in several yrs and will call for follow up eval... he saw DrRDavis recently w/ incr PSA>5 & Rx w/ Cipro... PSA here is 4.17, and we will send to Urology attention...   ~  May 09, 2009:  6 mo ROV doing well- BP, Chol, Thyroid all doing satis... he saw DrLove w/ tremor right hand & he favored prob early Parkinson's... they decided to watch it- no meds yet...  he slipped on the ice several weeks ago- no apparent injury... had Flu vaccine 10/10, & we discussed PNEUMONIA vaccination today.   ~  November 30, 2009:  right hand tremor progressed & DrLove started Rx w/ Mirapex- slowly incr dose but no improvement yet... he has also started accupuncture treatments in [facility]... he noted decr BP initially after starting the Mirapex but now back to normal... he tells me he saw DrGrapey for GU f/u 3 mo ago & PSA was "good" per pt (we don't have note)... due for f/u CXR, EKG, and fasting blood work.    Current Problem List:  HYPERTENSION (ICD-401.9) - controlled on HYZAAR 100/25 daily, & CARDURA 8mg - taking 1/2 daily...  BP= 132/74 here & similar at home... feeling well- denies HA, fatigue, visual changes, CP, palipit, dizziness, syncope, dyspnea, edema, etc...  ATHEROSCLEROTIC VASCULAR DISEASE (ICD-440.9) - on ASA 81mg /d... he denies weakness, numbness, speech difficulties, etc...  ~  Eval 2003 by [facility] for TIA showed cerebrovasc dis w/ R>L PCA  stenoses...  HYPERLIPIDEMIA (ICD-272.4) - on SIMVASTATIN 40mg /d + FISH OIL 1000mg /d...   ~  FLP 12/07 on Zocor showed TChol 137, HDL 47, LDL 70  ~  FLP 7/09 off med showed TChol 196, TG 95, HDL 49, LDL 128... rec> restart Zocor 40mg .  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 he is s/p thyroidectomy for medullary carcinoma of the thyroid 6/02 by [facility]...  ~  labs 7/09 showed TSH= 3.47  ~  labs 7/10 showed TSH= 2.95  ~  labs 8/11 showed TSH= 1.20  COLONIC POLYPS (ICD-211.3) - last colonoscopy 1/07 by DrSam showed divertics & hems only... f/u planned 48yrs.  BENIGN PROSTATIC HYPERTROPHY, WITH OBSTRUCTION (ICD-600.01) - he sees DrDavis for Urology once a year... he has a brother who had prostate cancer...   ~  PSA followed by [facility] yrearly... 7/10 pt reports PSA>5 (was 4.17 here) & Rx w/ Cipro...  ~  he had f/u Urology eval DrGrapey 5/11- pt indicates that his PSA was OK...  TRANSIENT ISCHEMIC ATTACK (ICD-435.9) - hx of recurrent right brain TIA's in 2003... initially on ASA + Plavix and the Plavix was stopped after his subdural in 2006...  Hx of SUBDURAL HEMATOMA (ICD-432.1) - hosp 7/06 w/ left subdural hematoma req craniotomy by  DrHirsh for evacuation... no known trauma- he was on ASA/ Plavix and developed a headache... he also take DILANTIN 100mg  Tid now per [facility] (for a partial seizure characterized by aphasia)...  ~  7/10: pt tells me he has decreased his Dilantin to Bid and will f/u w/ DrLove.  TREMOR (ICD-781.0) - he notes mild tremor right hand & arm... eval from Neurology- DrLove- indicated prob early [name] and after a period of observation they started MIRAPEX 0.125mg - titrate up to 6/d...  ~  8/11: pt tells me that he has started accupuncture treatments in [place].  DEGENERATIVE JOINT DISEASE (ICD-715.90)  Hx of CARCINOMA, SKIN,  SQUAMOUS CELL (ICD-173.9) - s/p removal of skin cancer from his right forearm 11/06 by DrLupton...  Health Maintenance:  he had PNEUMONIA vaccines in 1998 & 1/11... gets yearly Flu vaccine every fall... [place] TDAP 8/11 OV...   Preventive Screening-Counseling & Management  Alcohol-Tobacco     Smoking Status: quit     Year Started: 1950     Year Quit: 1967     Pack years: 36yrs, 1ppd  Allergies (verified): No Known Drug Allergies  Comments:  Nurse/Medical Assistant: The patient's medications and allergies were reviewed with the patient and were updated in the Medication and Allergy Lists.  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 mother deceased at age 63, stroke father deceased at age 25, alzheimer's bother living at 68 with alzheimer's sister living at 29 with alzheimer's  Social History: Reviewed history from 11/07/2008 and no changes required. Widow- wife died 03/15/2023 w/ metastatic breast cancer 3 children  former smoker for 26yrs 1 drink daily retired  Review of Systems      See HPI  The patient denies anorexia, fever, weight loss, weight gain, vision loss, decreased hearing, hoarseness, chest pain, syncope, dyspnea on exertion, peripheral edema, prolonged cough, headaches, hemoptysis, abdominal pain, melena, hematochezia, severe  indigestion/heartburn, hematuria, incontinence, muscle weakness, suspicious skin lesions, transient blindness, difficulty walking, depression, unusual weight change, abnormal bleeding, enlarged lymph nodes, and angioedema.    Vital Signs:  Patient profile:   77 year old male Height:      76 inches Weight:      186.25 pounds BMI:     22.75 O2 Sat:      96 % on Room air Temp:     96.7 degrees F oral Pulse rate:   53 / minute BP sitting:   132 / 74  (left arm) Cuff size:   regular  Vitals Entered By: Randell Loop CMA (November 30, 2009 9:34 AM)  O2 Sat at Rest %:  96 O2 Flow:  Room air CC: 7 month ROV & review of mult medical problems... Is Patient Diabetic? No Pain Assessment Patient in pain? no      Comments meds updated today with pt   Physical Exam  Additional Exam:  WD, WN, 77 y/o WM in NAD... GENERAL:  Alert & oriented; pleasant & cooperative... HEENT:  [place]/AT, EOM-wnl, PERRLA, EACs-clear, TMs-wnl, NOSE-clear, THROAT-clear & wnl. NECK:  Supple w/ fairROM; no JVD; normal carotid impulses w/o  bruits; thyroid area scar, no nodules palp; no lymphadenopathy. CHEST:  Clear to P & A; without wheezes/ rales/ or rhonchi heard... HEART:  Regular Rhythm; without murmurs/ rubs/ or gallops detected... ABDOMEN:  Soft & nontender; normal bowel sounds; no organomegaly or masses palpated... EXT: without deformities, mild arthritic changes; no varicose veins/ +venous insuffic/ no edema. NEURO:  CN's intact;  mod tremor right hand & arm at rest, ?early mask facies, still moves well. DERM:  No lesions noted; no rash etc...    CXR  Procedure date:  11/30/2009  Findings:      CHEST - 2 VIEW Comparison: Chest 11/11/2004 and 09/27/2005.   Findings: The lungs are clear.  Heart size is normal.  There is no pleural effusion or pneumothorax.  No focal bony abnormality.   IMPRESSION: Negative exam.   Read By:  Charyl Dancer,  M.D.   EKG  Procedure date:   11/30/2009  Findings:      Sinus bradycardia with rate of:  50/ min... Tracing shows NSSTTWA, NAD...   SN   MISC. Report  Procedure date:  11/30/2009  Findings:      Lipid Panel (LIPID)   Cholesterol               138 mg/dL                   9-562   Triglycerides             73.0 mg/dL                  1.3-086.5   HDL                       78.46 mg/dL                 >96.29   LDL Cholesterol           75 mg/dL                    5-28  Hepatic/Liver Function Panel (HEPATIC)   Total Bilirubin           1.2 mg/dL                   4.1-3.2   Direct Bilirubin          0.2 mg/dL                   4.4-0.1   Alkaline Phosphatase      54 U/L                      39-117   AST                       18 U/L                      0-37   ALT                       14 U/L                      0-53   Total Protein             7.0 g/dL                    0.2-7.2   Albumin  4.4 g/dL                    2.8-4.1  TSH (TSH)   FastTSH                   1.20 uIU/mL                 0.35-5.50   Impression & Recommendations:  Problem # 1:  HYPERTENSION (ICD-401.9) Controlled>  same meds. His updated medication list for this problem includes:    Hyzaar 100-25 Mg Tabs (Losartan potassium-hctz) .Marland Kitchen... Take 1 tablet by mouth once a day    Cardura 8 Mg Tabs (Doxazosin mesylate) .Marland Kitchen... Take 1/2 tablet by mouth once a day  Orders: 12 Lead EKG (12 Lead EKG) T-2 View CXR (71020TC)  Problem # 2:  ATHEROSCLEROTIC VASCULAR DISEASE (ICD-440.9) Stable on ASA 81mg /d...  Problem # 3:  HYPERLIPIDEMIA (ICD-272.4) Lipid profile looks great on the Simva40... His updated medication list for this problem includes:    Simvastatin 40 Mg Tabs (Simvastatin) .Marland Kitchen... 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 same dose. Orders: TLB-TSH  (Thyroid Stimulating Hormone) (84443-TSH)  Problem # 5:  BENIGN PROSTATIC HYPERTROPHY, WITH OBSTRUCTION (ICD-600.01) Followed by DrGrapey now... His updated medication list for this problem includes:    Cardura 8 Mg Tabs (Doxazosin mesylate) .Marland Kitchen... Take 1/2 tablet by mouth once a day  Problem # 6:  TREMOR (ICD-781.0) Per DrLove, now on Mirapex...  Problem # 7:  OTHER MEDICAL PROBLEMS AS NOTED>>>  Complete Medication List: 1)  Bayer Low Strength 81 Mg Tbec (Aspirin) .... Take 1 tablet by mouth once a day 2)  Hyzaar 100-25 Mg Tabs (Losartan potassium-hctz) .... Take 1 tablet by mouth once a day 3)  Cardura 8 Mg Tabs (Doxazosin mesylate) .... Take 1/2 tablet by mouth once a day 4)  Simvastatin 40 Mg Tabs (Simvastatin) .... Take 1 tab by mouth at bedtime 5)  Fish Oil 1000 Mg Caps (Omega-3 fatty acids) .... Take 1 capsule by mouth once a day 6)  Synthroid 200 Mcg Tabs (Levothyroxine sodium) .... Take 1 tablet by mouth once a day 7)  Cialis 20 Mg Tabs (Tadalafil) .... Use as directed 8)  Multivitamins Tabs (Multiple vitamin) .... Take 1 tablet by mouth once a day 9)  Metamucil 30.9 % Powd (Psyllium) 10)  Mirapex 0.125 Mg Tabs (Pramipexole dihydrochloride) .... Take as directed by drlove...  Other Orders: Tdap => 26yrs IM (32440) Admin 1st Vaccine (10272)  Patient Instructions: 1)  Today we updated your med list- see below.... 2)  Continue your current meds the same for now... have your Pharm give [place] a call for any needed refills... 3)  Today we did your follow up CXR, EKG, and FASTING blood work... please call the "phone tree" in a few days for your results.Marland KitchenMarland Kitchen 4)  Call for any problems.Marland KitchenMarland Kitchen 5)  Please schedule a follow-up appointment in 6 months.   Immunizations Administered:  Tetanus Vaccine:    Vaccine Type: Tdap    Site: left deltoid    Mfr: boostrix    Dose: 0.5 ml    Route: IM    Given by: Randell Loop CMA    Exp. Date: 07/07/2011    Lot #: [id number]    VIS given: 03/03/07  version given November 30, 2009.    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 Tracing shows NSSTTWA, NAD...   SN

## 2010-05-15 NOTE — Progress Notes (Signed)
Summary: gout/ rx request  Phone Note Call from Patient   Caller: Patient Call For: William [name] Summary of Call: pt c/o gout in his L big toe. says it woke him up this am at 4:00. stabbing pain. not swollen or red or warm to the touch, but he has had this before and dr William [name] treated him for it (and it worked well). [facility]. pt # [id number] Initial call taken by: Tivis Ringer, CNA,  June 16, 2009 9:09 AM  Follow-up for Phone Call        spoke with pt.  Pt states he is having a gout flare in left big toe.  states he is having stabing pains that come and go since early this am.  denies swelling, redness, or warm to touch.  states he was treated for it before bySN but does not remember what he was given.  Requesting rx.  Will forward to TP-please advise.  Thanks!  Allergies-Sulfa.  [facility].   Follow-up by: Gweneth Dimitri RN,  June 16, 2009 9:17 AM  Additional Follow-up for Phone Call Additional follow up Details #1::        can see today no mention in his chart or med list for gout.  Additional Follow-up by: Rubye Oaks NP,  June 16, 2009 9:23 AM    Additional Follow-up for Phone Call Additional follow up Details #2::    called, spoke with pt.  Pt informed of above statement per TP.  Ov scheduled with TP for this am at 11:45.  Pt aware.  Gweneth Dimitri RN  June 16, 2009 9:38 AM

## 2010-05-29 ENCOUNTER — Ambulatory Visit (INDEPENDENT_AMBULATORY_CARE_PROVIDER_SITE_OTHER): Payer: MEDICARE | Admitting: Pulmonary Disease

## 2010-05-29 ENCOUNTER — Encounter: Payer: Self-pay | Admitting: Pulmonary Disease

## 2010-05-29 DIAGNOSIS — M1990 Unspecified osteoarthritis, unspecified site: Secondary | ICD-10-CM

## 2010-05-29 DIAGNOSIS — E785 Hyperlipidemia, unspecified: Secondary | ICD-10-CM

## 2010-05-29 DIAGNOSIS — R259 Unspecified abnormal involuntary movements: Secondary | ICD-10-CM

## 2010-05-29 DIAGNOSIS — N401 Enlarged prostate with lower urinary tract symptoms: Secondary | ICD-10-CM

## 2010-05-29 DIAGNOSIS — N138 Other obstructive and reflux uropathy: Secondary | ICD-10-CM

## 2010-05-29 DIAGNOSIS — I10 Essential (primary) hypertension: Secondary | ICD-10-CM

## 2010-05-29 DIAGNOSIS — I7090 Unspecified atherosclerosis: Secondary | ICD-10-CM

## 2010-05-29 DIAGNOSIS — C73 Malignant neoplasm of thyroid gland: Secondary | ICD-10-CM

## 2010-06-12 NOTE — Assessment & Plan Note (Signed)
Summary: 6 month /apc   CC:  6 month ROV & review of mult medical problems....  History of Present Illness: 77 y/o WM here for a follow up visit... he has multiple medical problems as noted below... followed for HBP, Carotid vasc dis w/ hx of TIA & prev subdural hematoma, Chol, & prev surg for medullary thyroid carcinoma... his wife passed away from metastatic breast cancer 11/09...   ~  May 09, 2009:  6 mo ROV doing well- BP, Chol, Thyroid all doing satis... he saw DrLove w/ tremor right hand & he favored prob early Parkinson's... they decided to watch it- no meds yet...  he slipped on the ice several weeks ago- no apparent injury... had Flu vaccine 10/10, & we discussed PNEUMONIA vaccination today.   ~  November 30, 2009:  right hand tremor progressed & DrLove started Rx w/ Mirapex- slowly incr dose but no improvement yet... he has also started accupuncture treatments in [facility]... he noted decr BP initially after starting the Mirapex but now back to normal... he tells me he saw DrGrapey for GU f/u 3 mo ago & PSA was "good" per pt (we don't have note)... due for f/u CXR, EKG, and fasting blood work.   ~  May 29, 2010:   He's had a good 66mo- feeling great he says...    BP controlled on Hyzaar & Cardura w/ BP today 122/64 & siilar at home;  he denies CP, palpit, SOB edmea, and no cerebral ischemic symptoms...    Chol has been well controlled on Simva40 + Fish Oil & labs 8/11 reviewed (not fasting today).    He notes sl constip but relieved if he takes Miralax Prn;  last colonoscopy was 2007- neg x divertics. hems...    He continues to f/u w/ DrLove & now on Sinemet for his Parkinson's temor in right hand;  pt still golfs regularly &walks for exercise.   Current Problem List:  HYPERTENSION (ICD-401.9) - controlled on HYZAAR 100/25 daily, & CARDURA 8mg - taking 1/2 daily...  BP= 122/64 here & similar at home... feeling well- denies HA, fatigue, visual changes, CP, palipit, dizziness,  syncope, dyspnea, edema, etc...  ATHEROSCLEROTIC VASCULAR DISEASE (ICD-440.9) - on ASA 81mg /d... he denies weakness, numbness, speech difficulties, etc...  ~  Eval 2003 by [facility] for TIA showed cerebrovasc dis w/ R>L PCA stenoses...  HYPERLIPIDEMIA (ICD-272.4) - on SIMVASTATIN 40mg /d + FISH OIL 1000mg /d> doing well.  ~  FLP 12/07 on Zocor showed TChol 137, HDL 47, LDL 70  ~  FLP 7/09 off med showed TChol 196, TG 95, HDL 49, LDL 128... rec> restart Zocor 40mg .  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 he is s/p thyroidectomy for medullary carcinoma of the thyroid 6/02 by [facility]...  ~  labs 7/09 showed TSH= 3.47  ~  labs 7/10 showed TSH= 2.95  ~  labs 8/11 showed TSH= 1.20  COLONIC POLYPS (ICD-211.3) - last colonoscopy 1/07 by DrSam showed divertics & hems only... f/u planned 73yrs.  BENIGN PROSTATIC HYPERTROPHY, WITH OBSTRUCTION (ICD-600.01) - he sees DrDavis for Urology once a year... he has a brother who had prostate cancer...   ~  PSA followed by [id number] yrearly... 7/10 pt reports PSA>5 (was 4.17 here) & Rx w/ Cipro...  ~  he had f/u Urology eval DrGrapey 5/11- pt  indicates that his PSA was OK...  TRANSIENT ISCHEMIC ATTACK (ICD-435.9) - hx of recurrent right brain TIA's in 2003... initially on ASA + Plavix and the Plavix was stopped after his subdural in 2006...  Hx of SUBDURAL HEMATOMA (ICD-432.1) - hosp 7/06 w/ left subdural hematoma req craniotomy by DrHirsh for evacuation... no known trauma- he was on ASA/ Plavix and developed a headache... he also take DILANTIN 100mg  Tid now per [facility] (for a partial seizure characterized by aphasia)...  ~  7/10: pt tells me he has decreased his Dilantin to Bid and will f/u w/ DrLove.  ~  8/11:  we don't have any recent notes from [facility], but pt is off the Dilantin rx.  TREMOR (ICD-781.0) - he notes mild tremor  right hand & arm... eval from Neurology- DrLove- indicated prob early [name] and after a period of observation they started MIRAPEX 0.125mg - titrate up to 6/d...  ~  8/11: pt tells me that he has started accupuncture treatments in [place].  ~  2/12:  pt indicates that DrLove stopped the Mirapex in favor of SINEMET ?dose- 1/2 Tid.  DEGENERATIVE JOINT DISEASE (ICD-715.90) - he notes some knee discomfort  Hx of CARCINOMA, SKIN, SQUAMOUS CELL (ICD-173.9) - s/p removal of skin cancer from his right forearm 11/06 by DrLupton...  Health Maintenance:  he had PNEUMONIA vaccines in 1998 & 1/11... gets yearly Flu vaccine every fall... OK TDAP 8/11 OV...   Preventive Screening-Counseling & Management  Alcohol-Tobacco     Smoking Status: quit     Year Started: 1950     Year Quit: 1967     Pack years: 20yrs, 1ppd  Allergies (verified): No Known Drug Allergies  Comments:  Nurse/Medical Assistant: The patient's medications and allergies were reviewed with the patient and were updated in the Medication and Allergy Lists.  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 mother deceased at age 65, stroke father deceased at age 34, alzheimer's bother living at 19 with alzheimer's sister living at 3 with alzheimer's  Social History: Reviewed history from 11/07/2008 and  no changes required. Widow- wife died March 05, 2023 w/ metastatic breast cancer 3 children  former smoker for 65yrs 1 drink daily retired  Review of Systems       The patient complains of dyspnea on exertion and difficulty walking.  The patient denies anorexia, fever, weight loss, weight gain, vision loss, decreased hearing, hoarseness, chest pain, syncope, peripheral edema, prolonged cough, headaches, hemoptysis, abdominal pain, melena, hematochezia, severe indigestion/heartburn, hematuria, incontinence, muscle weakness, suspicious skin lesions, transient blindness, depression, unusual weight change, abnormal bleeding, enlarged lymph nodes, and angioedema.         Right hand tremor as noted...  Vital Signs:  Patient profile:   77 year old male Height:      76 inches Weight:      187.13 pounds O2 Sat:      99 % on Room air Temp:     97.6 degrees F oral Pulse rate:   64 / minute BP sitting:   122 / 64  (right arm) Cuff  size:   regular  Vitals Entered By: Randell Loop CMA (May 29, 2010 10:01 AM)  O2 Sat at Rest %:  99 O2 Flow:  Room air CC: 6 month ROV & review of mult medical problems... Is Patient Diabetic? No Pain Assessment Patient in pain? no      Comments meds updated today with pt   Physical Exam  Additional Exam:  WD, WN, 77 y/o WM in NAD... GENERAL:  Alert & oriented; pleasant & cooperative... HEENT:  [place]/AT, EOM-wnl, PERRLA, EACs-clear, TMs-wnl, NOSE-clear, THROAT-clear & wnl. NECK:  Supple w/ fairROM; no JVD; normal carotid impulses w/o bruits; thyroid area scar, no nodules palp; no lymphadenopathy. CHEST:  Clear to P & A; without wheezes/ rales/ or rhonchi heard... HEART:  Regular Rhythm; without murmurs/ rubs/ or gallops detected... ABDOMEN:  Soft & nontender; normal bowel sounds; no organomegaly or masses palpated... EXT: without deformities, mild arthritic changes; no varicose veins/ +venous insuffic/ no edema. NEURO:  CN's intact;  mod tremor right hand & arm  at rest, ?early mask facies, still moves well. DERM:  No lesions noted; no rash etc...    Impression & Recommendations:  Problem # 1:  TREMOR (ICD-781.0) He appears to have Parkinson's dis & followed by Autumn Patty now on Sinemet but we don't have notes... discussed w/ pt & he will ask DrLove to seend [place] notes...  Problem # 2:  HYPERTENSION (ICD-401.9) BP controlled>  continue same. His updated medication list for this problem includes:    Hyzaar 100-25 Mg Tabs (Losartan potassium-hctz) .Marland Kitchen... Take 1 tablet by mouth once a day    Cardura 8 Mg Tabs (Doxazosin mesylate) .Marland Kitchen... Take 1/2 tablet by mouth once a day  Problem # 3:  ATHEROSCLEROTIC VASCULAR DISEASE (ICD-440.9) He remains on ASA regularly...  Problem # 4:  HYPERLIPIDEMIA (ICD-272.4) Stable on Simva40 + Fish Oil... His updated medication list for this problem includes:    Simvastatin 40 Mg Tabs (Simvastatin) .Marland Kitchen... 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  Problem # 6:  BENIGN PROSTATIC HYPERTROPHY, WITH OBSTRUCTION (ICD-600.01) GU followed by DrGrapey now>  stable & pt reports PSA was OK. His updated medication list for this problem includes:    Cardura 8 Mg Tabs (Doxazosin mesylate) .Marland Kitchen... Take 1/2 tablet by mouth once a day  Problem # 7:  DEGENERATIVE JOINT DISEASE (ICD-715.90) He notes some Knee arthritis & takes OTC meds Prn... His updated medication list for this problem includes:    Bayer Low Strength 81 Mg Tbec (Aspirin) .Marland Kitchen... Take 1 tablet by mouth once a day  Problem # 8:  OTHER MEDICAL PROBLEMS AS NOTED>>>  Complete Medication List: 1)  Bayer Low Strength 81 Mg Tbec (Aspirin) .... Take 1 tablet by mouth once a day 2)  Hyzaar 100-25 Mg Tabs (Losartan potassium-hctz) .... Take 1 tablet by mouth once a day 3)  Cardura 8 Mg Tabs (Doxazosin mesylate) .... Take 1/2 tablet by mouth once a day 4)   Simvastatin 40 Mg Tabs (Simvastatin) .... Take 1 tab by mouth at bedtime 5)  Fish Oil 1000 Mg Caps (Omega-3 fatty acids) .... Take 1 capsule by mouth once a day 6)  Synthroid 200 Mcg Tabs (Levothyroxine sodium) .... Take 1 tablet by mouth once a day 7)  Cialis 20 Mg Tabs (Tadalafil) .... Use as directed 8)  Multivitamins Tabs (Multiple vitamin) .... Take 1 tablet by mouth  once a day 9)  Metamucil 30.9 % Powd (Psyllium) 10)  Sinemet ?dose...  .... Take as directed by drlove

## 2010-08-31 NOTE — Op Note (Signed)
NAMEKIMBER, William [name]              ACCOUNT NO.:  [id number]   MEDICAL RECORD NO.:  [id number]          PATIENT TYPE:  INP   LOCATION:  3109                         FACILITY:  [facility]   PHYSICIAN:  Clydene Fake, M.D.  DATE OF BIRTH:  01-08-1929   DATE OF PROCEDURE:  11/11/2004  DATE OF DISCHARGE:                                 OPERATIVE REPORT   DIAGNOSIS:  Left subdural hematoma.   POSTOPERATIVE DIAGNOSIS:  Left subdural hematoma.   PROCEDURE:  Left craniotomy and evacuation of subdural, epidural JP drain.   SURGEON:  Colon Branch, M.D.   General endotracheal tube anesthesia.   ESTIMATED BLOOD LOSS:  100 mL.   BLOOD GIVEN:  None.   COMPLICATIONS:  None.   REASON FOR PROCEDURE:  Patient is a 77 year old gentleman who has had a few  day history of some severe headaches which last night, around 9:30, after  dinner, really became progressively worse and after that continued.  He was  brought to the hospital, had some nausea and vomiting.  A CT scan of the  head was done showing large left subdural hematoma with midline shift and  the patient is being taken to the operating room for evacuation.  He did  have some right pronator drift.   PROCEDURE IN DETAIL:  The patient was brought in the operating room, general  anesthesia was induced.  The patient was placed in [place] head pins.  The  intubation was very difficult and he has had this problem in the past.  The  patient was induced under general anesthesia and then again prepped and  draped in sterile fashion after bolting his head in [place] pins.  A linear  incision from anterior to posterior across the skull was made after  injection with 20 mL of 1% lidocaine with epinephrine.  An incision was then  taken down now to the skull, retractor was used to spread the scalp.  The  lower aspect of the parietal area and a left side frontoparietal craniotomy  was elevated using craniotome.  Drill holes were placed in the bone  surrounding the craniotomy and two in the center of the bone plug and then  drill tack up sutures were placed.  The dura was opened in a U-shaped  fashion with pedicle being medially and the subdural was immediately seen.  This was evacuated with suction irrigation.  Posterior there is large thick  subdural, again this was irrigated loose and suctioned out as we evacuated  the hematoma.  As we did that, the brain became lax and pulsatile.  There  was slight bleeding medially and near veins as I started to enter near the  sagittal sinus.  Some Gelfoam was placed around this area and bleeding  stopped.  We irrigated the subdural space and it came back clear.  Some of  the clot for specimen to permanent sections.  Dura was then closed with 4-0  Nurolon sutures.  We kept some gaps in the suture line on purpose and an  epidural JP drain was placed, a central tack up suture was placed and the  bone plug was put back into position and held there with three __________  system plates.  Tack up suture was then tied.  Retractor was removed, we had  good hemostasis and the galea was closed with 2-0 Vicryl interrupted  sutures, skin closed with staples.  Nylon was used to suture the drain,  which came out through a  separate stab wound incision and this was __________ drain.  This was  connected to bulb suction.  Patient was then removed from head pins, awoken  from anesthesia and transferred to the intensive care unit in stable but  fair condition.       JRH/MEDQ  D:  11/11/2004  T:  11/11/2004  Job:  [id number]

## 2010-08-31 NOTE — Procedures (Signed)
CLINICAL INFORMATION:  This patient is a 77 year old patient who has a  history of complaints of numbness in his right hand. He has had surgery for  the left side of his brain with staples in the left scalp. This surgery was  two weeks ago for apparently a blood clot on the left..   TECHNICAL DESCRIPTION:  This EEG was recorded in the awake and drowsy  states.  The background activity does show alpha rhythms in the 11 Hz range  with higher amplitude seen in the posterior head regions bilaterally. In  addition, there is evidence of some intermittent theta slowing in the  bitemporal region. Throughout this EEG, there is intermittent sharp waves  present at the C3 electrode, occasionally with phase reversal, these are of  high amplitude. There is no evidence of any definite spike activity present  and no electrographic seizure noted.   IMPRESSION:  This is an abnormal EEG showing left sided central sharp waves  which may indicate a lowered threshold for seizures. No other definite  abnormality was noted on this study.   ADDENDUM:  Photic stimulation was performed which did not produce evidence  of a driving response.       ZOX:WRUE  D:  11/26/2004 11:15:04  T:  11/26/2004 13:37:33  Job #:  [id number]

## 2010-08-31 NOTE — Discharge Summary (Signed)
[facility]  Patient:    MAXIMUS, HOFFERT                   MRN: [id number] Adm. Date:  [id number] Disc. Date: [id number] Attending:  Andre Lefort CC:         Lonzo Cloud. Kriste Basque, M.D. [facility]   Discharge Summary  DISCHARGE DIAGNOSES: 1. Medullary carcinoma of the thyroid (3.3 cm tumor, T2, N0 carcinoma). 2. Hypertension, stable. 3. History of hematuria.  PROCEDURE:  Total thyroidectomy on October 02, 2000.  HISTORY OF PRESENT ILLNESS:  Mr. Platten is a 77 year old, white male who is a patient of Dr. Alroy Dust who has a left thyroid mass which he has noticed over the last maybe three to six months.  A needle biopsy of this mass shows atypical cells.  The patient has no other neck mass or adenopathy.  He has noticed no difficulty with swallowing, no changes in his voice.  He has had no prior radiation therapy.  He says his mother did have a goiter.  He has no family history of thyroid cancer.  ALLERGIES:  No known drug allergies.  MEDICATIONS:  Cardura and Maxzide for hypertension.  He has been hypertensive for about 20 years.  REVIEW OF SYSTEMS:  Significant only for about 10 years ago when he was evaluated by Dr. Bonnee Quin from a cardiac standpoint, but apparently had a negative evaluation.  He has had no further testing since that time.  He has also had some hematuria and sees Dr. Darvin Neighbours every six months for followup, but has no significant diagnosis.  PHYSICAL EXAMINATION:  This is a well-nourished, tall gentleman who has about a 4 cm mass of his left thyroid lobe.  I feel no cervical or supraclavicular adenopathy.  HOSPITAL COURSE:  Discussion was carried out with the patient about the potential for malignancy, the potential for needing a total thyroidectomy and he was taken to the operating room on October 02, 2000.  I first did the left thyroid lobe and he did have malignant cells.  We then did a total thyroidectomy on  him.  Postoperatively, he did well.  His calcium dipped as low as 8.0 and was up to 8.2 on postop day #2.  He was eating well and swallowing well with no swelling of his neck and was ready for discharge.  DISCHARGE MEDICATIONS: 1. Synthroid 100 mcg per day. 2. Calcium tablets. 3. Vicodin for pain.  ACTIVITY:  Light.  FOLLOWUP:  He will see me back in a week for followup.  LABORATORY DATA AND X-RAY FINDINGS:  Final pathology showed medullary carcinoma of the thyroid at 3.3 cm in greatest diameter.  It did not go through the capsule and did not have extra thyroid extension.  Thyroid calcitonin drawn was not back on the chart at the time of this dictation.  CONDITION ON DISCHARGE:  Good. DD:  10/17/00 TD:  10/17/00 Job: [id number]

## 2010-08-31 NOTE — Op Note (Signed)
[facility]  Patient:    William [name], William [name]                   MRN: [id number] Proc. Date: 10/02/00 Adm. Date:  [id number] Attending:  Andre Lefort CC:         Lonzo Cloud. Kriste Basque, M.D. [facility]   Operative Report  CCS#:  [id number]  DATE OF BIRTH:  1928-10-20  PREOPERATIVE DIAGNOSES:  Left thyroid mass, atypical cells on needle aspiration.  POSTOPERATIVE DIAGNOSES:  Left thyroid mass, carcinoma on frozen section by Dr. Tamala Fothergill.  PROCEDURE:  Total thyroidectomy.  SURGEON:  Dr. Ezzard Standing.  FIRST ASSISTANT:  Dr. Magnus Ivan.  ANESTHESIA:  General endotracheal.  ESTIMATED BLOOD LOSS:  Minimal.  INDICATIONS FOR PROCEDURE:  Mr. Tibbetts is a 77 year old white male who is a patient of Dr. Alroy Dust who has had an enlarging left thyroid mass which by needle aspiration reveals atypical cells and now comes for left thyroid lobectomy. Discussion carried out with the patient and his wife regarding the possibility of cancer and if this is cancer we do a total thyroidectomy. The risk of hypocalcemia secondary to parathyroid injury, the risk of recurrent laryngeal nerve, bleeding, and infection.  DESCRIPTION OF PROCEDURE:  The patient presented to the operating room and underwent a general endotracheal anesthesia by Dr. Frutoso Chase and Dr. Richardean Chimera. We actually had a little bit of trouble intubating him and spent probably better than the first hour of the patient in the operating room trying to obtain an airway though he was never in any danger. Dr. Rica Mast was able to place an endotracheal tube over a flexible bronchoscope with the patient awake and breathing. Once the airway was maintained, we then tucked his arms to his side, prepped his neck with Betadine solution and sterilely draped him.  ______ in his neck about 2-3 cm above his clavicle which seemed to be a perfect area to place the incision. I put the incision in this fold, retracted the  platysma, elevated flaps superiorly and inferiorly, found the strap muscles which I divided in the midline. I turned my attention first to the left thyroid lobe and went and found the superior poles which I isolated and ligated with 2 and 3-0 silk sutures. The inferior vessel I ligated with 2 and 3-0 silk sutures. I was able to identify what I thought at least was 1 parathyroid gland which was right behind the middle thyroid vein lateral to the left thyroid gland.  I also found the recurrent laryngeal nerve I thought was well posterior to our dissection and I was able to rotate the thyroid gland medially. I dissected and divided the attachments posteriorly and Berrys ligament off the trachea. I then marked the thyroid gland and sent this specimen to Dr. Guilford Shi. He did a frozen section in which he thought he saw atypical cells or at least a papillary carcinoma and possibly some more bazaar tumor and thought it would be appropriate for [place] to proceed with a total thyroidectomy that he did have the diagnosis of carcinoma.  I turned my attention to the right side of the neck and then identified the upper pole vessels, lower pole vessels. Again I thought I found a very well defined parathyroid gland lateral to the thyroid behind the thyroid vein and I spared this parathyroid. The gland was rotated medially. Again I thought I could find the recurrent laryngeal nerve on the right. I actually left maybe a little button  of thyroid tissue which was not much bigger than a centimeter at largest on the right side of parathyroid because I did to devascularize this and again marked the thyroid gland with a long suture towards the head and a short suture towards the isthmus and sent this for permanent sections.  At this point, I irrigated the wound and there was no evidence of any bleeding. I palpated no cervical or sternal lymph nodes, so I felt there were no lymph nodes to cherry pick out of the neck.  I then closed the strap muscles with interrupted 3-0 Vicryl sutures, closed the platysma with 3-0 Vicryl suture and skin with 5-0 Vicryl and painted with tinctured Benzoin and Steri-Strips.  The patient tolerated the procedure well and was transported to the recovery room. He was extubated.  Sponge and needle count were correct at the end of the case. Estimated blood loss was 75 cc. Drains left were none. DD:  10/02/00 TD:  10/02/00 Job: [id number]

## 2010-08-31 NOTE — Consult Note (Signed)
William [name], William [name]              ACCOUNT NO.:  [id number]   MEDICAL RECORD NO.:  [id number]          PATIENT TYPE:  INP   LOCATION:  3103                         FACILITY:  [facility]   PHYSICIAN:  Genene Churn. Love, M.D.    DATE OF BIRTH:  1928/10/06   DATE OF CONSULTATION:  11/14/2004  DATE OF DISCHARGE:                                   CONSULTATION   This 77 year old right-handed white married male was admitted November 12, 2003,  for evaluation of headaches and found to have evidence of a left-sided  subdural hematoma for which he underwent surgery. I am asked to see him to  evaluate long-term antiplatelet therapy and history of TIAs.   HISTORY OF PRESENT ILLNESS:  William [name] has a known prior history of  medullary carcinoma of the thyroid and is status post surgery in July of  2002. He has also had a history of cholecystectomy and right hernia repair,  status post cataract surgery  and recurrent right brain TIAs in January of  2003, at which time he was having left-sided numbness, vertigo, and was  admitted to [facility]. [facility] April 19, 2001. There was no  associated headache, syncope, seizure or palpitations. The symptoms had  resolved by the time he arrived in the hospital. At that time, his  medications included aspirin therapy. He is on multivitamins. He did not  smoke cigarettes and he had no history of coronary artery disease or  diabetes mellitus. His examination at the time of admission was unremarkable  except for an absence of left ankle jerk. His MRI study of the brain showed  no evidence of acute stroke and MRA showed right greater than left PCA  stenosis. He was placed on aspirin plus Plavix therapy and did well without  recurrent TIA symptomatology until this recent hospitalization. He noted a  couple of days prior to his hospital place that he had left-sided headache  aggravated by cough, sneeze and bending over. This was unassociated with any  head  trauma. The patient had no other focal neurologic symptoms and was seen  in the emergency room on November 11, 2004 where a CT scan showed evidence of a  left-sided subdural hematoma with 9 mm left-to-right shift. He underwent  surgery by Dr. Colon Branch and with resolution of the hematoma November 12, 2000, on repeat CT scan. In the hospital he had no surgeries, he seemed to  have no focal neurologic symptomatology and has done well off of aspirin and  Plavix therapy.   MEDICATIONS AT HOME:  1.  Plavix.  2.  Baby aspirin.  3.  Cardura.  4.  Hyzaar.  5.  Synthroid.  6.  Fish oil.  7.  Multivitamins.   As mentioned, he does not smoke cigarettes.   ALLERGIES:  He has no known allergies.   He drinks one glass of wine per day.   PHYSICAL EXAMINATION:  GENERAL APPEARANCE:  Well-developed white male.  VITAL SIGNS:  Blood pressure, by my exam, in the right arm 180/80; left arm  of 180/80 and heart rate of 64  and regular. His O2 sats were 97% on room  air.  HEENT:  He was status post the left craniotomy with bandages in place and  drain in place.  NECK:  No bruits were heard in the neck.  MENTAL STATUS:  He was alert, oriented x3. He followed one, two and three-  step commands, could repeat phrases, name objects, scored 28/30 on MMSE had  no evidence of circumlocution and could read and write. CRANIAL NERVES:  Visual fields full. Both disks were flat. The extraocular which were full  and corneals were present. There was no seventh nerve palsy. The tongue was  midline. The uvula was midline and gags were present. His  sternocleidomastoid and trapezius testing were normal. His motor examination  revealed 5/5 strength in the upper and lower extremities. His coordination  testing of finger-to-nose and heel-to-shin to be well done. He had an  outstretched hand and arm tremor. Sensory examination was intact to pinprick  touch, joint position and vibration testing.  The deep tendon reflexes were   2+ and plantar responses were downgoing.   IMPRESSION:  1.  Acute subdural hematoma status post craniotomy, code 852.20.  2.  History of right brain transient ischemic attacks, probably right PCA      and localization code 435.9.  3.  Hypertension, code 796.2.  4.  Medullary carcinoma status post surgery October 14, 2000, code 193.  5.  Status post cholecystectomy, status post right hernia repair and status      post bilateral cataract surgery.   Plan at this time is to follow the patient clinically, start Aggrenox 25/200  one daily in three weeks and return to see me in 6 weeks.       JML/MEDQ  D:  11/14/2004  T:  11/14/2004  Job:  [id number]   cc:   Lonzo Cloud. Kriste Basque, M.D. [facility]

## 2010-08-31 NOTE — Discharge Summary (Signed)
William [name], William [name]              ACCOUNT NO.:  [id number]   MEDICAL RECORD NO.:  [id number]          PATIENT TYPE:  INP   LOCATION:  3040                         FACILITY:  [facility]   PHYSICIAN:  Clydene Fake, M.D.  DATE OF BIRTH:  09/03/1928   DATE OF ADMISSION:  11/11/2004  DATE OF DISCHARGE:  11/16/2004                                 DISCHARGE SUMMARY   DIAGNOSES:  Left subdural hematoma.   DISCHARGE DIAGNOSES:  Left subdural hematoma.   PROCEDURES:  Left craniotomy for evacuation of subdural hematoma.   REASON FOR ADMISSION:  Patient is a 77 year old gentleman who has had a  history of left-sided headache which worsened.  There was some nausea and  vomiting.  Was brought to the emergency room.  CT was done showing left  subdural hematoma with some midline shift.  Patient was admitted.  He was on  Plavix and aspirin.   HOSPITAL COURSE:  Patient was admitted and taken to the OR on November 11, 2004  for evacuation of subdural hematoma.  Epidural JP drain was placed  intraoperatively and he was in the ICU postoperative.  Next day CT scan of  the head was done showing hematoma was just about completely resolved with  no shift, really looked good.  Drain was putting out some minimal drainage.  He slowly increased activity and by November 13, 2004, he was alert and  oriented x3, still not having all that much drainage from the JP drain that  was left in to allow __________ and he was transferred to the ACU on the  1st.  He slowly started increasing his activity.  PT/OT starting working  with the patient to help increase his activity.  Continued making progress.  By the 2nd, he had minimal drainage for the previous 24 hours and the JP  drain was removed and skin was clean, dry, and intact.  He continued to do  well and on November 15, 2004 was transferred to the floor and he had a CT scan  of the head done on the following morning November 16, 2004.  This showed  decreased pneumocephalus with  just a slight rim of subdural blood with no  shift.  He was awake, alert, oriented x3 with no drift, ambulating well and  moving around well.  Because he was doing so well he was discharged home in  stable condition.  No anticoagulation.  Follow up in one week for staple  removal from the incision.  Will do a CT in about a month or so  postoperative and with his history of TIAs he does need follow up with Dr.  Sandria Manly in 4-6 weeks.           ______________________________  Clydene Fake, M.D.     JRH/MEDQ  D:  02/07/2005  T:  02/07/2005  Job:  [id number]

## 2010-08-31 NOTE — Consult Note (Signed)
[facility]  Patient:    William [name], William [name] Visit Number: [id number] MRN: [id number]          Service Type: EMS Location: [place] Attending Physician:  Doug Sou Dictated by:   Genene Churn. Love, M.D. Proc. Date: 04/19/01 Admit Date:  04/19/2001   CC:         Lonzo Cloud. Kriste Basque, M.D. [facility]   Consultation Report  PATIENT ADDRESS:  [address], [place], [place]  DATE OF BIRTH:  December 05, 1928  REASON FOR CONSULTATION:  This 77 year old, right-handed, white, married male is seen at the request of Dr. Doug Sou for evaluation of transient neurologic symptoms.  HISTORY OF PRESENT ILLNESS:  Mr. Frankie has a known history of medullary thyroid carcinoma and underwent surgery in July 2002.  He has been on thyroid replacement since that time, was on 50 mcg, the 100 mcg, then 200 mcg q.d. recently.  He also has a known prior history of hypertension.  Over the last three to four weeks, he has noted some recurrent left arm numbness lasting as long as 2 hours without other symptoms.  In the morning, he stood up after taking his medicines and noted the onset of the sensation of spinning.  He became cold and clammy and had tingling in his left arm without associated headache, syncope, seizure, chest pain, or palpitations.  He came to the [facility] where his symptoms had resolved.  He has noted recently symptoms of dizziness on standing and cut down his Cozaar which had been started about three weeks ago to 1/2 tablet per day.  He has no known history of diabetes mellitus, heart disease, or stroke.  CURRENT MEDICATIONS:  Cardura, Maxzide, Cozaar current 1/2 tablet per day, Synthroid, aspirin, and multivitamins.  The dosages of his medications is unknown.  ALLERGIES:  No known allergies.  PAST MEDICAL HISTORY:  Significant for medullary carcinoma of the thyroid treated with surgery in July 2002.  He has  had a cholecystectomy in 1972.  He has had cataract surgery bilaterally with lens implants on the right in 1999 and on the left in the year 2000.  He had "a heart scare" in 1994 and ended up being admitted to a hospital in [place].  He had increase blood pressure, was admitted to a hospital in [place], and after leaving that hospital returned to [facility] and was evaluated by Dr. Bonnee Quin, cardiologist, without any definite evidence of coronary artery disease found.  SOCIAL HISTORY:  He does not smoke cigarettes.  He drinks one glass of wine per day.  He has been educated through two years of college.  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  He enjoys playing golf.  FAMILY HISTORY:  His mother died at age 7 from Alzheimers disease.  His father died at 55 from a stroke.  He has one brother, age 42, living and well. His sister, 65, has dementia.  He has three sons, 35, 75, and 66, living and well.  The rest of his family history is unremarkable.  PHYSICAL EXAMINATION:  GENERAL:  Well-developed, pleasant white male in no acute distress.  VITAL SIGNS:  Blood pressure in the right arm 170/80 lying, left arm 170/80 lying, and then it went to 130/80 standing in the left arm.   His heart  rate was 64 and regular.  NECK:  There were no bruits heard.  The neck flexion and extension maneuvers were unremarkable.  HEENT:  Neither tympanic membrane could be seen.  NEUROLOGIC:  Mental Status: He was alert and oriented x 3.  Cranial nerve examination revealed visual fields full.  The disks were flat.  The extraocular movements were full, and corneals were present.  The left pupil was larger than the right.  There was no definite Horners syndrome, however, present.  Facial sensation was equal.  Tongue was midline.  The uvula was midline.  Gags  were present.  Sternocleidomastoid and trapezius testing were normal.  His motor examination revealed good strength in the upper and lower extremities.  His coordination testing revealed finger-to-nose, heel-to-shin, and rapid alternating movement skills to be normal.  His sensory examination was intact to pinprick, touch, positional, and vibration testing.  His deep tendon reflexes were 2+ with an absent left ankle jerk.  Plantar responses were downgoing.  LABORATORY DATA:  MRI study of the brain showed no evidence of any acute stroke.  There was minimal small vessel disease present.  His MR angiogram showed normal intracranial and extracranial carotids and basilar artery, but there was evidence of intracranial atherosclerotic vascular disease, particularly in the right PCA versus the left PCA with some stenosis present.  Hemoglobin 16, hematocrit 47.  Sodium 139, potassium 3.8, chloride 104, CO2 content 33, BUN 18, creatinine 1.4, glucose 96.  IMPRESSION: 1. Vertigo (Code 780.4). 2. Left arm numbness (Code 782.0).  I suspect that he has had a right    posterior cerebral artery transient ischemic attack (Code 435.9) 3. Suspect right brain transient ischemic attack (Code 435.9) 4. Postural hypotension (Code 458.0). 5. Medullary thyroid carcinoma.  (Code unknown)  PLAN:  At this time, continue cutting his Cozaar so that he is at 1/2 tablet per day to help avoid the postural hypotension.  I have asked him to see Dr. Kriste Basque this week and have him check blood pressures lying and standing.  No driving for two to three weeks.  Add Plavix to his regimen and have him call me should he have further problems. Dictated by:   Genene Churn. Love, M.D. Attending Physician:  Doug Sou DD:  04/19/01 TD:  04/20/01 Job: [id number]

## 2010-11-29 ENCOUNTER — Ambulatory Visit (INDEPENDENT_AMBULATORY_CARE_PROVIDER_SITE_OTHER): Payer: Medicare Other | Admitting: Pulmonary Disease

## 2010-11-29 ENCOUNTER — Encounter: Payer: Self-pay | Admitting: Pulmonary Disease

## 2010-11-29 ENCOUNTER — Other Ambulatory Visit (INDEPENDENT_AMBULATORY_CARE_PROVIDER_SITE_OTHER): Payer: Medicare Other

## 2010-11-29 DIAGNOSIS — I7090 Unspecified atherosclerosis: Secondary | ICD-10-CM

## 2010-11-29 DIAGNOSIS — E785 Hyperlipidemia, unspecified: Secondary | ICD-10-CM

## 2010-11-29 DIAGNOSIS — R259 Unspecified abnormal involuntary movements: Secondary | ICD-10-CM

## 2010-11-29 DIAGNOSIS — C4490 Unspecified malignant neoplasm of skin, unspecified: Secondary | ICD-10-CM

## 2010-11-29 DIAGNOSIS — D126 Benign neoplasm of colon, unspecified: Secondary | ICD-10-CM

## 2010-11-29 DIAGNOSIS — I10 Essential (primary) hypertension: Secondary | ICD-10-CM

## 2010-11-29 DIAGNOSIS — C73 Malignant neoplasm of thyroid gland: Secondary | ICD-10-CM

## 2010-11-29 DIAGNOSIS — N401 Enlarged prostate with lower urinary tract symptoms: Secondary | ICD-10-CM

## 2010-11-29 DIAGNOSIS — G459 Transient cerebral ischemic attack, unspecified: Secondary | ICD-10-CM

## 2010-11-29 DIAGNOSIS — I6200 Nontraumatic subdural hemorrhage, unspecified: Secondary | ICD-10-CM

## 2010-11-29 DIAGNOSIS — M1990 Unspecified osteoarthritis, unspecified site: Secondary | ICD-10-CM

## 2010-11-29 DIAGNOSIS — N138 Other obstructive and reflux uropathy: Secondary | ICD-10-CM

## 2010-11-29 LAB — BASIC METABOLIC PANEL
BUN: 19 mg/dL (ref 6–23)
Chloride: 98 mEq/L (ref 96–112)
Creatinine, Ser: 1.3 mg/dL (ref 0.4–1.5)
GFR: 55.87 mL/min — ABNORMAL LOW (ref >60.00)

## 2010-11-29 LAB — CBC WITH DIFFERENTIAL/PLATELET
Basophils Absolute: 0 10*3/uL (ref 0.0–0.1)
Eosinophils Absolute: 0.1 10*3/uL (ref 0.0–0.7)
Hemoglobin: 14.9 g/dL (ref 13.0–17.0)
Lymphs Abs: 0.9 10*3/uL (ref 0.7–4.0)
Monocytes Absolute: 0.5 10*3/uL (ref 0.1–1.0)
Platelets: 148 10*3/uL — ABNORMAL LOW (ref 150.0–400.0)
RBC: 4.9 Mil/uL (ref 4.22–5.81)

## 2010-11-29 LAB — LIPID PANEL
Cholesterol: 125 mg/dL (ref 0–200)
HDL: 55.6 mg/dL (ref >39.00)
Triglycerides: 69 mg/dL (ref 0.0–149.0)

## 2010-11-29 LAB — HEPATIC FUNCTION PANEL
ALT: 9 U/L (ref 0–53)
Albumin: 4.2 g/dL (ref 3.5–5.2)
Bilirubin, Direct: 0.2 mg/dL (ref 0.0–0.3)
Total Bilirubin: 1.3 mg/dL — ABNORMAL HIGH (ref 0.3–1.2)
Total Protein: 6.8 g/dL (ref 6.0–8.3)

## 2010-11-29 NOTE — Patient Instructions (Signed)
Today we updated your med list in EPIC...    Continue your current meds the same...  Today we did your follow up fasting blood work...    Please call the PHONE TREE in a few days for your results...    Dial [id number] & when prompted enter your patient number followed by the # symbol...    Your patient number is:  [id number]#  Keep up the great job w/ your exercise program...  Call for any problems...  Let's plan a routine follow up visit in 6 months or so.Marland KitchenMarland Kitchen

## 2010-11-29 NOTE — Progress Notes (Signed)
Subjective:    Patient ID: William [name], male    DOB: [birth year], 77 y.o.   MRN: [id number]  HPI 77 y/o WM here for a follow up visit... he has multiple medical problems as noted below... followed for HBP, Carotid vasc dis w/ hx of TIA & prev subdural hematoma, Chol, & prev surg for medullary thyroid carcinoma... his wife passed away from metastatic breast cancer 11/09...  ~  May 09, 2009:  77 y/o 6 mo ROV doing well- BP, Chol, Thyroid all doing satis...  6 mo ROV doing well- BP, Chol, Thyroid all doing satis... he saw DrLove w/ tremor right hand & he favored prob early Parkinson's... they decided to watch it- no meds yet...  he slipped on the ice several weeks ago- no apparent injury... had Flu vaccine 10/10, & we discussed PNEUMONIA vaccination today.  ~  November 30, 2009:  right hand tremor progressed & DrLove started Rx w/ Mirapex- slowly incr dose but no improvement yet... he has also started accupuncture treatments in [facility]... he noted decr BP initially after starting the Mirapex but now back to normal... he tells me he saw DrGrapey for GU f/u 3 mo ago & PSA was "good" per pt (we don't have note)... due for f/u CXR, EKG, and fasting blood work.  ~  May 29, 2010:   He's had a good 75mo- feeling great he says...   He's had a good 15mo- feeling great he says...    BP controlled on Hyzaar & Cardura w/ BP today 122/64 & siilar at home;  he denies CP, palpit, SOB edema, and no cerebral ischemic symptoms...    Chol has been well controlled on Simva40 + Fish Oil & labs 8/11 reviewed (not fasting today).    He notes sl constip but relieved if he takes Miralax Prn;  last colonoscopy was 2007- neg x divertics. hems...    He continues to f/u w/ DrLove & now on Sinemet for his Parkinson's temor in right hand;  pt still golfs regularly &walks for exercise.  ~  November 29, 2010:  75mo ROV & he reports doing well, no new complaints or concerns & wonders what he's really doing here today; he golfs 3d per week & doing satis w/o CP, palpit, dizzy, SOB, etc...  15mo ROV & he reports doing well, no new complaints or concerns & wonders what he's really doing here today; he golfs 3d per week & doing satis w/o CP, palpit, dizzy, SOB, etc...    BP controlled on meds, 120/80 today &  tolerated well...    He continues on ASA 81mg  daily w/o cerebral ischemic symptoms etc; he is off Plavix ever since his SDH...    Chol remains well regulated on the simva40, Fish Oil & diet rx...    Hx medullary thyroid cancer, followed by [facility], TFTs remain wnl...    He saw GU DrGrapey last week he said & PSA was ~3.1 by his hx...    Still followed by Autumn Patty for Neuro (& he reports consult at [facility] from DrScott- we do not have notes from them) w/ Tremor, Parkinson's features, on Sinemet 25-100 Tid; "I'm doing exercises"    NOTE: labs today look good x TSH sl over-suppressed at 0.23 & pt suggested to decr the Synthroid to 1 daily x 1/2 on MWF...   Current Problem List:  HYPERTENSION (ICD-401.9) - controlled on HYZAAR 100/25 daily, & CARDURA 8mg - taking 1/2 daily...  ATHEROSCLEROTIC VASCULAR DISEASE (ICD-440.9) - on ASA 81mg /d;  Prev Plavix was discontinued after his subdural hematoma in 2006. ~  Eval 2003 by [facility] for TIA showed cerebrovasc dis w/ R>L PCA stenoses...  HYPERLIPIDEMIA (ICD-272.4) - on SIMVASTATIN 40mg /d + FISH OIL 1000mg /d> doing well. ~  FLP 12/07 on Zocor showed TChol 137, HDL 47, LDL 70 ~  FLP 7/09 off med  showed TChol 196, TG 95, HDL 49, LDL 128... rec> restart Zocor 40mg . ~  FLP 7/10 showed TChol 135, TG 47, HDL 53, LDL 73 ~  FLP 8/11 showed TChol 138, TG 73, HDL 49, LDL 75 ~  FLP 8/12 showed TChol 125, TG 69, HDL 56, LDL 56  MALIGNANT NEOPLASM OF THYROID GLAND (ICD-193) - on SYNTHROID 211mcg/d... he is s/p thyroidectomy for medullary carcinoma of the thyroid 6/02 by [facility]... ~  labs 7/09 showed TSH= 3.47 ~  labs 7/10 showed TSH= 2.95 ~  labs 8/11 showed TSH= 1.20 ~  Labs 8/12 showed TSH= 0.23 & pt rec to decr the Synthroid200 to only 1/2 on MWF...  COLONIC POLYPS (ICD-211.3) - last colonoscopy 1/07 by DrSam showed divertics & hems only... f/u planned 60yrs.  BENIGN PROSTATIC HYPERTROPHY, WITH OBSTRUCTION (ICD-600.01) - he sees DrDavis for Urology once  a year... he has a brother who had prostate cancer...  ~  PSA followed by [id number] yrearly... 7/10 pt reports PSA>5 (was 4.17 here) & Rx w/ Cipro... ~  he had f/u Urology eval DrGrapey 5/11- pt indicates that his PSA was OK...  TRANSIENT ISCHEMIC ATTACK (ICD-435.9) - hx of recurrent right brain TIA's in 2003... initially on ASA + Plavix and the Plavix was stopped after his subdural in 2006...  Hx of SUBDURAL HEMATOMA (ICD-432.1) - hosp 7/06 w/ left subdural hematoma req craniotomy by DrHirsh for evacuation... no known trauma- he was on ASA/ Plavix and developed a headache... he also take DILANTIN 100mg  Tid now per [facility] (for a partial seizure characterized by aphasia)... ~  7/10: pt tells me he has decreased his Dilantin to Bid and will f/u w/ DrLove. ~  8/11:  we don't have any recent notes from [facility], but pt is off the Dilantin rx.  TREMOR (ICD-781.0) - he notes mild tremor right hand & arm... eval from Neurology- DrLove- indicated prob early [name] and after a period of observation they started MIRAPEX 0.125mg - titrate up to 6/d... ~  8/11: pt tells me that he has started accupuncture treatments in [place]. ~  2/12:  pt indicates that DrLove stopped the Mirapex in favor of SINEMET 25/100- taking 1/2 Tid. ~  8/12:  Pt tells me he has seen DrScott, Neurology at [facility] (?referred by [facility], ?second opinion), we don't have records, pt reports no change in meds...  DEGENERATIVE JOINT DISEASE (ICD-715.90) - he notes some knee discomfort  Hx of CARCINOMA, SKIN, SQUAMOUS CELL (ICD-173.9) - s/p removal of skin cancer from his right forearm 11/06 by DrLupton...  Health Maintenance:  he had PNEUMONIA vaccines in 1998 & 1/11... gets yearly Flu vaccine every fall... OK TDAP 8/11 OV...   Past Surgical History  Procedure Date  . Cataract sugery   . Cholecystectomy   . Right inguinal hernia repair   . Thyroidectomy for medullary carcinoma     Outpatient Encounter Prescriptions as of  11/29/2010  Medication Sig Dispense Refill  . aspirin 81 MG tablet Take 81 mg by mouth daily.        . carbidopa-levodopa (SINEMET) 10-100 MG per tablet Take 1 tablet by mouth 3 (three) times daily.        Marland Kitchen doxazosin (CARDURA) 8 MG tablet Take 1/2 tablet by mouth once daily       . fish oil-omega-3 fatty acids 1000 MG capsule Take 2 g by mouth daily.        Marland Kitchen levothyroxine (SYNTHROID, LEVOTHROID) 200 MCG tablet Take 200 mcg by mouth daily.        Marland Kitchen  losartan-hydrochlorothiazide (HYZAAR) 100-25 MG per tablet Take 1 tablet by mouth daily.        . Multiple Vitamin (MULTIVITAMIN) capsule Take 1 capsule by mouth daily.        . Psyllium (METAMUCIL) 30.9 % POWD Once daily       . simvastatin (ZOCOR) 40 MG tablet Take 40 mg by mouth at bedtime.        . tadalafil (CIALIS) 20 MG tablet Take 20 mg by mouth daily as needed.          No Known Allergies   Current Medications, Allergies, Past Medical History, Past Surgical History, Family History, and Social History were reviewed in [employer] record.    Review of Systems       See HPI - all other systems neg except as noted...       The patient complains of dyspnea on exertion, right hand tremor, and difficulty walking.  The patient denies anorexia, fever, weight loss, weight gain, vision loss, decreased hearing, hoarseness, chest pain, syncope, peripheral edema, prolonged cough, headaches, hemoptysis, abdominal pain, melena, hematochezia, severe indigestion/heartburn, hematuria, incontinence, muscle weakness, suspicious skin lesions, transient blindness, depression, unusual weight change, abnormal bleeding, enlarged lymph nodes, and angioedema.     Objective:   Physical Exam     WD, WN, 77 y/o WM in NAD... GENERAL:  Alert & oriented; pleasant & cooperative... HEENT:  [place]/AT, EOM-wnl, PERRLA, EACs-clear, TMs-wnl, NOSE-clear, THROAT-clear & wnl. NECK:  Supple w/ fairROM; no JVD; normal carotid impulses w/o bruits; thyroid  area scar, no nodules palp; no lymphadenopathy. CHEST:  Clear to P & A; without wheezes/ rales/ or rhonchi heard... HEART:  Regular Rhythm; without murmurs/ rubs/ or gallops detected... ABDOMEN:  Soft & nontender; normal bowel sounds; no organomegaly or masses palpated... EXT: without deformities, mild arthritic changes; no varicose veins/ +venous insuffic/ no edema. NEURO:  CN's intact;  mod tremor right hand & arm at rest, ?early mask facies, still moves well. DERM:  No lesions noted; no rash etc...   Assessment & Plan:   HBP>  Controlled on meds, continue same...  PVD>  On ASA w/o cerebral ischemic symptoms, continue same...  HYPERLIPID>  On Simva40 + Fish Oil & FLPs have been at goal, continue same...  Hx Medullary Thyroid Carcinoma>  S/p surg by DrNewman2002, on Synthroid 276mcg/d & TSH is sl over-suppressed on this dose; in view of his tremor etc we will decr the dose sl to 1/2 on MWF...  GI> remote hx polyp, divertics, hems>  Last colon 2007 by by DrSam & suggested f/u 42yrs...  GU>  Followed by DrGrapey now, he reports recent check was ok & PSA ~3.1 by his hx...  NEURO> Hx TIA, Hx SDH, Tremor/ Parkinson's>  Followed by Autumn Patty & now DrScott at [facility] as well by pt's hx...  DJD>  He uses OTC meds as needed & plays golf regularly...  Other medical issues as noted.Marland KitchenMarland Kitchen

## 2010-11-30 ENCOUNTER — Encounter: Payer: Self-pay | Admitting: Pulmonary Disease

## 2011-02-04 ENCOUNTER — Ambulatory Visit (INDEPENDENT_AMBULATORY_CARE_PROVIDER_SITE_OTHER): Payer: Medicare Other

## 2011-02-04 DIAGNOSIS — Z23 Encounter for immunization: Secondary | ICD-10-CM

## 2011-02-18 ENCOUNTER — Other Ambulatory Visit: Payer: Self-pay | Admitting: Pulmonary Disease

## 2011-03-02 ENCOUNTER — Other Ambulatory Visit: Payer: Self-pay | Admitting: Pulmonary Disease

## 2011-04-01 ENCOUNTER — Telehealth: Payer: Self-pay | Admitting: Pulmonary Disease

## 2011-04-01 MED ORDER — OSELTAMIVIR PHOSPHATE 75 MG PO CAPS: 75.0000 mg | ORAL_CAPSULE | Freq: Two times a day (BID) | ORAL | Status: AC

## 2011-04-01 NOTE — Telephone Encounter (Signed)
Called and spoke with pt and he stated that his friend has been dx with the flu---he has been exposed to her.  Pt is requesting rx to be sent in to his pharmacy for the tamiflu.  Pt has no symptoms at this time.  SN please advise. Thanks  No Known Allergies

## 2011-04-01 NOTE — Telephone Encounter (Signed)
Per SN---ok for tamiflu 75mg   #10  1 tablet bid until gone.  Called and spoke with pt and he is aware of rx sent to his pharmacy.

## 2011-04-26 ENCOUNTER — Telehealth: Payer: Self-pay | Admitting: Pulmonary Disease

## 2011-04-26 MED ORDER — DOXAZOSIN MESYLATE 8 MG PO TABS: ORAL_TABLET | ORAL | Status: DC

## 2011-04-26 MED ORDER — LEVOTHYROXINE SODIUM 200 MCG PO TABS: 200.0000 ug | ORAL_TABLET | Freq: Every day | ORAL | Status: DC

## 2011-04-26 MED ORDER — LOSARTAN POTASSIUM-HCTZ 100-25 MG PO TABS: 1.0000 | ORAL_TABLET | Freq: Every day | ORAL | Status: DC

## 2011-04-26 NOTE — Telephone Encounter (Signed)
Refills of meds have been sent to [facility] per pts request.  Called and spoke with pt and pt is aware.

## 2011-06-03 ENCOUNTER — Telehealth: Payer: Self-pay | Admitting: Pulmonary Disease

## 2011-06-03 DIAGNOSIS — C73 Malignant neoplasm of thyroid gland: Secondary | ICD-10-CM

## 2011-06-03 DIAGNOSIS — I10 Essential (primary) hypertension: Secondary | ICD-10-CM

## 2011-06-03 DIAGNOSIS — E785 Hyperlipidemia, unspecified: Secondary | ICD-10-CM

## 2011-06-03 NOTE — Telephone Encounter (Signed)
I spoke with pt and is requesting to have fasting labs done prior to his OV 06/06/11. Please advise Dr. Kriste Basque, thanks

## 2011-06-04 NOTE — Telephone Encounter (Signed)
Per SN: FLP, liver, and TSH. I have placed orders in EPIC and pt is aware. He needed nothing further

## 2011-06-06 ENCOUNTER — Other Ambulatory Visit (INDEPENDENT_AMBULATORY_CARE_PROVIDER_SITE_OTHER): Payer: Medicare Other

## 2011-06-06 ENCOUNTER — Encounter: Payer: Self-pay | Admitting: Pulmonary Disease

## 2011-06-06 ENCOUNTER — Ambulatory Visit (INDEPENDENT_AMBULATORY_CARE_PROVIDER_SITE_OTHER): Payer: Medicare Other | Admitting: Pulmonary Disease

## 2011-06-06 VITALS — BP 122/60 | HR 56 | Temp 97.0°F | Ht 76.0 in | Wt 182.4 lb

## 2011-06-06 DIAGNOSIS — E785 Hyperlipidemia, unspecified: Secondary | ICD-10-CM

## 2011-06-06 DIAGNOSIS — G459 Transient cerebral ischemic attack, unspecified: Secondary | ICD-10-CM

## 2011-06-06 DIAGNOSIS — I10 Essential (primary) hypertension: Secondary | ICD-10-CM

## 2011-06-06 DIAGNOSIS — G20A1 Parkinson's disease without dyskinesia, without mention of fluctuations: Secondary | ICD-10-CM

## 2011-06-06 DIAGNOSIS — N401 Enlarged prostate with lower urinary tract symptoms: Secondary | ICD-10-CM

## 2011-06-06 DIAGNOSIS — N138 Other obstructive and reflux uropathy: Secondary | ICD-10-CM

## 2011-06-06 DIAGNOSIS — M1990 Unspecified osteoarthritis, unspecified site: Secondary | ICD-10-CM

## 2011-06-06 DIAGNOSIS — C73 Malignant neoplasm of thyroid gland: Secondary | ICD-10-CM

## 2011-06-06 DIAGNOSIS — I7090 Unspecified atherosclerosis: Secondary | ICD-10-CM

## 2011-06-06 DIAGNOSIS — G20 Parkinson's disease: Secondary | ICD-10-CM

## 2011-06-06 DIAGNOSIS — I6200 Nontraumatic subdural hemorrhage, unspecified: Secondary | ICD-10-CM

## 2011-06-06 LAB — LIPID PANEL
LDL Cholesterol: 59 mg/dL (ref 0–99)
Total CHOL/HDL Ratio: 2
VLDL: 13.8 mg/dL (ref 0.0–40.0)

## 2011-06-06 LAB — HEPATIC FUNCTION PANEL
Bilirubin, Direct: 0.2 mg/dL (ref 0.0–0.3)
Total Bilirubin: 1.2 mg/dL (ref 0.3–1.2)
Total Protein: 7 g/dL (ref 6.0–8.3)

## 2011-06-06 LAB — TSH: TSH: 2.29 u[IU]/mL (ref 0.35–5.50)

## 2011-06-06 NOTE — Patient Instructions (Signed)
Today we updated your med list in our EPIC system...    Continue your current medications the same...  Today we did your follow up fasting blood work...    Please call the PHONE TREE in a few days for your results...    Dial [id number] & when prompted enter your patient number followed by the # symbol...    Your patient number is:  [id number]  Call for any questions...  Let's continue our 6 month follow up visits.Marland KitchenMarland Kitchen

## 2011-06-06 NOTE — Progress Notes (Signed)
Subjective:    Patient ID: William [name], male    DOB: April 25, 1928, 78 y.o.   MRN: [id number]  HPI 78 y/o WM here for a follow up visit... he has multiple medical problems as noted below... followed for HBP, Carotid vasc dis w/ hx of TIA & prev subdural hematoma, Chol, & prev surg for medullary thyroid carcinoma... his wife passed away from metastatic breast cancer 11/09...  ~  May 29, 2010:   He's had a good 22mo- feeling great he says...    BP controlled on Hyzaar & Cardura w/ BP today 122/64 & siilar at home;  he denies CP, palpit, SOB edema, and no cerebral ischemic symptoms...    Chol has been well controlled on Simva40 + Fish Oil & labs 8/11 reviewed (not fasting today).    He notes sl constip but relieved if he takes Miralax Prn;  last colonoscopy was 2007- neg x divertics. hems...    He continues to f/u w/ DrLove & now on Sinemet for his Parkinson's temor in right hand;  pt still golfs regularly &walks for exercise.  ~  November 29, 2010:  22mo ROV & he reports doing well, no new complaints or concerns & wonders what he's really doing here today; he golfs 3d per week & doing satis w/o CP, palpit, dizzy, SOB, etc...    BP controlled on meds, 120/80 today & tolerated well...    He continues on ASA 81mg  daily w/o cerebral ischemic symptoms etc; he is off Plavix ever since his SDH...    Chol remains well regulated on the Simva40, Fish Oil & diet rx...    Hx medullary thyroid cancer, followed by [facility], TFTs remain wnl...    He saw GU DrGrapey last week he said & PSA was ~3.1 by his hx...    Still followed by Autumn Patty for Neuro (& he reports consult at [facility] from DrScott- we do not have notes from them) w/ Tremor, Parkinson's features, on Sinemet 25-100 Tid; "I'm doing exercises"    NOTE: labs today look good x TSH sl over-suppressed at 0.23 & pt suggested to decr the Synthroid to 1 daily x 1/2 on MWF...  ~  June 06, 2011:  22mo ROV & [name] states he is doing satis, w/o new  complaints or concerns... He had a left lower lid ectropion repair 12/12 by DrZaldivar in [facility], [place].    BP remains controlled on meds; BP= 122/60 today & he remains asymptomatic w/o CP, palpit, SOB, edema, etc...    He remains on ASA81 & denies any cerebral ischemic symptoms; he has f/u appt soon w/ DrLove for f/u Parkinson's dis (on Sinemet)...    Chol looks good on Simva40 (see below)...     Hx medullary thyroid cancer, followed by DrDNewman, TSH today = 2.29 on Synthroid274mcg- 1x4d=TThSS & 1/2 x3d=MWF... We reviewed his problem list, meds, XRays & Labs today...          Problem List:  HYPERTENSION (ICD-401.9) - controlled on HYZAAR 100/25 daily, & CARDURA 8mg - taking 1/2 daily...  ATHEROSCLEROTIC VASCULAR DISEASE (ICD-440.9) - on ASA 81mg /d;  Prev Plavix was discontinued after his subdural hematoma in 2006. ~  Eval 2003 by [facility] for TIA showed cerebrovasc dis w/ R>L PCA stenoses...  HYPERLIPIDEMIA (ICD-272.4) - on SIMVASTATIN 40mg /d + FISH OIL 1000mg /d> doing well. ~  FLP 12/07 on Zocor showed TChol 137, HDL 47, LDL 70 ~  FLP 7/09 off med showed TChol 196, TG 95, HDL 49, LDL 128.Marland KitchenMarland Kitchen  rec> restart Zocor 40mg . 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 he is s/p thyroidectomy for medullary carcinoma of the thyroid 6/02 by [facility]... ~  labs 7/09 showed TSH= 3.47 ~  labs 7/10 showed TSH= 2.95 ~  labs 8/11 showed TSH= 1.20 ~  Labs 8/12 showed TSH= 0.23 & pt rec to decr the Synthroid200 to only 1/2 on MWF.Marland Kitchen. ~  Labs 2/13 showed TSH= 2.29 on Synthroid200- taking 1x4d=TThSS and 1/2 x3d=MWF...  COLONIC POLYPS (ICD-211.3) - last colonoscopy 1/07 by DrSam showed divertics & hems only... f/u planned 73yrs.  BENIGN PROSTATIC HYPERTROPHY, WITH  OBSTRUCTION (ICD-600.01) - he sees DrDavis for Urology once a year... he has a brother who had prostate cancer...  ~  PSA followed by XBJYNWGN yrearly... 7/10 pt reports PSA>5 (was 4.17 here) & Rx w/ Cipro... ~  he had f/u Urology eval DrGrapey 5/11- pt indicates that his PSA was OK...  TRANSIENT ISCHEMIC ATTACK (ICD-435.9) - hx of recurrent right brain TIA's in 2003... initially on ASA + Plavix and the Plavix was stopped after his subdural in 2006...  Hx of SUBDURAL HEMATOMA (ICD-432.1) - hosp 7/06 w/ left subdural hematoma req craniotomy by DrHirsh for evacuation... no known trauma- he was on ASA/ Plavix and developed a headache... he also take DILANTIN 100mg  Tid now per [facility] (for a partial seizure characterized by aphasia)... ~  7/10: pt tells me he has decreased his Dilantin to Bid and will f/u w/ DrLove. ~  8/11:  we don't have any recent notes from [facility], but pt is off the Dilantin rx.  TREMOR (ICD-781.0) - he notes mild tremor right hand & arm... eval from Neurology- DrLove- indicated prob early [name] and after a period of observation they started Mirapex (stopped due to hypotension), but changed to [facility] 2/12> dosed per DrLove... ~  8/11: pt tells me that he has started accupuncture treatments in [place]==> no benefit. ~  2/12:  pt indicates that DrLove stopped the Mirapex in favor of SINEMET 25/100- taking 1/2 Tid. ~  8/12:  Pt tells me he has seen DrScott, Neurology at [facility] (?referred by [facility], ?second opinion), we don't have records, pt reports no change in meds (he considered entering a drug study). ~  He continues to f/u w/ DrLove every 4-36months...  DEGENERATIVE JOINT DISEASE (ICD-715.90) - he notes some knee discomfort  Hx of CARCINOMA, SKIN, SQUAMOUS CELL (ICD-173.9) - s/p removal of skin cancer from his right forearm 11/06 by DrLupton...  Health Maintenance:  he had PNEUMONIA vaccines in 1998 & 1/11... gets yearly Flu vaccine every fall... OK TDAP 8/11  OV...   Past Surgical History  Procedure Date  . Cataract sugery   . Cholecystectomy   . Right inguinal hernia repair   . Thyroidectomy for medullary carcinoma     Outpatient Encounter Prescriptions as of 06/06/2011  Medication Sig Dispense Refill  . aspirin 81 MG tablet Take 81 mg by mouth daily.        Marland Kitchen doxazosin (CARDURA) 8 MG tablet Take 1/2 tablet by mouth daily  45 tablet  3  . fish oil-omega-3 fatty acids 1000 MG capsule Take 2 g by mouth daily.        Marland Kitchen  levothyroxine (SYNTHROID, LEVOTHROID) 200 MCG tablet Alternated 1 tablet one day and then 1/2 tablet the next      . losartan-hydrochlorothiazide (HYZAAR) 100-25 MG per tablet Take 1 tablet by mouth daily.  90 tablet  3  . Multiple Vitamin (MULTIVITAMIN) capsule Take 1 capsule by mouth daily.        . simvastatin (ZOCOR) 40 MG tablet Take 40 mg by mouth at bedtime.        . tadalafil (CIALIS) 20 MG tablet Take 20 mg by mouth daily as needed.        Marland Kitchen DISCONTD: levothyroxine (SYNTHROID, LEVOTHROID) 200 MCG tablet Take 1 tablet (200 mcg total) by mouth daily.  90 tablet  3  . DISCONTD: Psyllium (METAMUCIL) 30.9 % POWD Once daily         No Known Allergies   Current Medications, Allergies, Past Medical History, Past Surgical History, Family History, and Social History were reviewed in [employer] record.    Review of Systems       See HPI - all other systems neg except as noted...       The patient complains of dyspnea on exertion, right hand tremor, and difficulty walking.  The patient denies anorexia, fever, weight loss, weight gain, vision loss, decreased hearing, hoarseness, chest pain, syncope, peripheral edema, prolonged cough, headaches, hemoptysis, abdominal pain, melena, hematochezia, severe indigestion/heartburn, hematuria, incontinence, muscle weakness, suspicious skin lesions, transient blindness, depression, unusual weight change, abnormal bleeding, enlarged lymph nodes, and angioedema.      Objective:   Physical Exam     WD, WN, 78 y/o WM in NAD... GENERAL:  Alert & oriented; pleasant & cooperative... HEENT:  [place]/AT, EOM-wnl, PERRLA, EACs-clear, TMs-wnl, NOSE-clear, THROAT-clear & wnl. NECK:  Supple w/ fairROM; no JVD; normal carotid impulses w/o bruits; thyroid area scar, no nodules palp; no lymphadenopathy. CHEST:  Clear to P & A; without wheezes/ rales/ or rhonchi heard... HEART:  Regular Rhythm; without murmurs/ rubs/ or gallops detected... ABDOMEN:  Soft & nontender; normal bowel sounds; no organomegaly or masses palpated... EXT: without deformities, mild arthritic changes; no varicose veins/ +venous insuffic/ no edema. NEURO:  CN's intact;  mod tremor right hand & arm at rest, ?early mask facies, still moves well. DERM:  No lesions noted; no rash etc...  RADIOLOGY DATA:  Reviewed in the EPIC EMR & discussed w/ the patient...  LABORATORY DATA:  Reviewed in the EPIC EMR & discussed w/ the patient...   Assessment & Plan:   HBP>  Controlled on meds, continue same...  PVD>  On ASA w/o cerebral ischemic symptoms, continue same...  HYPERLIPID>  On Simva40 + Fish Oil & FLPs have been at goal, continue same...  Hx Medullary Thyroid Carcinoma>  S/p surg by DrNewman2002, on Synthroid 241mcg/d & TSH is sl over-suppressed on this dose; in view of his tremor etc we will decr the dose sl to 1/2 on MWF...  GI> remote hx polyp, divertics, hems>  Last colon 2007 by by DrSam & suggested f/u 11yrs...  GU>  Followed by DrGrapey now, he reports recent check was ok & PSA ~3.1 by his hx...  NEURO> Hx TIA, Hx SDH, Tremor/ Parkinson's>  Followed by Autumn Patty & now DrScott at [facility] as well by pt's hx...  DJD>  He uses OTC meds as needed & plays golf regularly...  Other medical issues as noted...   Patient's Medications  New Prescriptions   No medications on file  Previous Medications   ASPIRIN 81 MG TABLET  Take 81 mg by mouth daily.     CARBIDOPA-LEVODOPA (SINEMET) 25-100 MG  PER TABLET    Take as directed by Dr. Sandria Manly   DOXAZOSIN (CARDURA) 8 MG TABLET    Take 1/2 tablet by mouth daily   FISH OIL-OMEGA-3 FATTY ACIDS 1000 MG CAPSULE    Take 2 g by mouth daily.     LOSARTAN-HYDROCHLOROTHIAZIDE (HYZAAR) 100-25 MG PER TABLET    Take 1 tablet by mouth daily.   MULTIPLE VITAMIN (MULTIVITAMIN) CAPSULE    Take 1 capsule by mouth daily.     SIMVASTATIN (ZOCOR) 40 MG TABLET    Take 40 mg by mouth at bedtime.     TADALAFIL (CIALIS) 20 MG TABLET    Take 20 mg by mouth daily as needed.    Modified Medications   Modified Medication Previous Medication   LEVOTHYROXINE (SYNTHROID, LEVOTHROID) 200 MCG TABLET levothyroxine (SYNTHROID, LEVOTHROID) 200 MCG tablet      Alternated 1 tablet one day and then 1/2 tablet the next    Take 1 tablet (200 mcg total) by mouth daily.  Discontinued Medications   CARBIDOPA-LEVODOPA (SINEMET) 10-100 MG PER TABLET    Take as directed by Dr. Sandria Manly   PSYLLIUM (METAMUCIL) 30.9 % POWD    Once daily

## 2011-07-22 ENCOUNTER — Other Ambulatory Visit: Payer: Self-pay | Admitting: *Deleted

## 2011-07-22 MED ORDER — SIMVASTATIN 40 MG PO TABS: 40.0000 mg | ORAL_TABLET | Freq: Every day | ORAL | Status: DC

## 2011-10-01 ENCOUNTER — Telehealth: Payer: Self-pay | Admitting: Pulmonary Disease

## 2011-10-01 NOTE — Telephone Encounter (Signed)
Called spoke with pt's spouse who stated that pt had a MRI of the back yesterday at [facility] orth[facility] for a bulging disk.  Was told that pt has a cyst on his kidney.  Report to be sent to [facility], stated they forgot to ask for a copy to be sent to pt's urologist.    Leigh/Dr Kriste Basque, have you seen a copy of this report?  Pt is requesting a call back once the report has been reviewed.  Thanks.

## 2011-10-02 NOTE — Telephone Encounter (Signed)
Paper faxed over to [facility] for them to fax a copy of the MRI that was done on this pt.  Waiting for fax to come through.

## 2011-10-04 NOTE — Telephone Encounter (Signed)
Called and spoke with pt and he is aware that copy of MRI has been faxed to Dr. Ellin Goodie office.  He is aware that SN has reviewed these results and recs are for the pt to see Dr. Isabel Caprice for eval. Pt has appt in July but he has called to see if he can get in before then and is waiting on a call back from them.  Pt to call back is anything else is needed.

## 2011-10-04 NOTE — Telephone Encounter (Signed)
Papers have been received and given to SN to review.

## 2011-10-09 ENCOUNTER — Telehealth: Payer: Self-pay | Admitting: Pulmonary Disease

## 2011-10-09 NOTE — Telephone Encounter (Signed)
Called spoke with patient who requests that a copy of the MRI of the back he had sent here to be sent to Dr Blanche East for a second opinion.  Unable to find this in pt's chart.  Pt stated that if we are unable to locate our copy, to please let him know so that he can take care of this through the ordering physician.  Spoke with Marliss Czar - MRI may be at the [facility] [facility] scan station and they are behind in their scanning.  Called spoke with pt's spouse, informed her of the above.  Pt's spouse stated they will either contact the ordering physician or Dr Ellin Goodie office.  Nothing further needed.  Will sign off.

## 2011-11-07 ENCOUNTER — Other Ambulatory Visit: Payer: Self-pay | Admitting: Urology

## 2011-11-07 DIAGNOSIS — N2889 Other specified disorders of kidney and ureter: Secondary | ICD-10-CM

## 2011-11-27 ENCOUNTER — Ambulatory Visit
Admission: RE | Admit: 2011-11-27 | Discharge: 2011-11-27 | Disposition: A | Discharge status: Home / Self Care | Payer: Medicare Other | Source: Ambulatory Visit | Attending: Urology | Admitting: Urology

## 2011-11-27 VITALS — BP 187/101 | HR 72 | Temp 97.5°F | Resp 16 | Ht 75.0 in | Wt 180.0 lb

## 2011-11-27 DIAGNOSIS — N2889 Other specified disorders of kidney and ureter: Secondary | ICD-10-CM

## 2011-11-27 HISTORY — DX: Gout, unspecified: M10.9

## 2011-11-28 ENCOUNTER — Other Ambulatory Visit (HOSPITAL_COMMUNITY): Payer: Self-pay | Admitting: Interventional Radiology

## 2011-11-28 DIAGNOSIS — N2889 Other specified disorders of kidney and ureter: Secondary | ICD-10-CM

## 2011-12-05 ENCOUNTER — Ambulatory Visit (INDEPENDENT_AMBULATORY_CARE_PROVIDER_SITE_OTHER): Payer: Medicare Other | Admitting: Pulmonary Disease

## 2011-12-05 ENCOUNTER — Encounter: Payer: Self-pay | Admitting: Pulmonary Disease

## 2011-12-05 ENCOUNTER — Other Ambulatory Visit (INDEPENDENT_AMBULATORY_CARE_PROVIDER_SITE_OTHER): Payer: Medicare Other

## 2011-12-05 VITALS — BP 140/80 | HR 103 | Temp 97.0°F | Ht 76.0 in | Wt 179.6 lb

## 2011-12-05 DIAGNOSIS — C73 Malignant neoplasm of thyroid gland: Secondary | ICD-10-CM

## 2011-12-05 DIAGNOSIS — N401 Enlarged prostate with lower urinary tract symptoms: Secondary | ICD-10-CM

## 2011-12-05 DIAGNOSIS — E785 Hyperlipidemia, unspecified: Secondary | ICD-10-CM

## 2011-12-05 DIAGNOSIS — G459 Transient cerebral ischemic attack, unspecified: Secondary | ICD-10-CM

## 2011-12-05 DIAGNOSIS — I7090 Unspecified atherosclerosis: Secondary | ICD-10-CM

## 2011-12-05 DIAGNOSIS — I10 Essential (primary) hypertension: Secondary | ICD-10-CM

## 2011-12-05 DIAGNOSIS — N138 Other obstructive and reflux uropathy: Secondary | ICD-10-CM

## 2011-12-05 DIAGNOSIS — M1990 Unspecified osteoarthritis, unspecified site: Secondary | ICD-10-CM

## 2011-12-05 DIAGNOSIS — D126 Benign neoplasm of colon, unspecified: Secondary | ICD-10-CM

## 2011-12-05 DIAGNOSIS — G20 Parkinson's disease: Secondary | ICD-10-CM

## 2011-12-05 DIAGNOSIS — I6200 Nontraumatic subdural hemorrhage, unspecified: Secondary | ICD-10-CM

## 2011-12-05 LAB — BASIC METABOLIC PANEL
CO2: 35 mEq/L — ABNORMAL HIGH (ref 19–32)
Calcium: 8.9 mg/dL (ref 8.4–10.5)
Creatinine, Ser: 1.2 mg/dL (ref 0.4–1.5)
Glucose, Bld: 92 mg/dL (ref 70–99)
Sodium: 139 mEq/L (ref 135–145)

## 2011-12-05 LAB — HEPATIC FUNCTION PANEL
Albumin: 4.3 g/dL (ref 3.5–5.2)
Alkaline Phosphatase: 47 U/L (ref 39–117)
Total Protein: 6.9 g/dL (ref 6.0–8.3)

## 2011-12-05 LAB — TSH: TSH: 0.69 u[IU]/mL (ref 0.35–5.50)

## 2011-12-05 LAB — CBC WITH DIFFERENTIAL/PLATELET
Basophils Absolute: 0 10*3/uL (ref 0.0–0.1)
Hemoglobin: 15.6 g/dL (ref 13.0–17.0)
Lymphocytes Relative: 21 % (ref 12.0–46.0)
Monocytes Relative: 12.1 % — ABNORMAL HIGH (ref 3.0–12.0)
Neutro Abs: 2.4 10*3/uL (ref 1.4–7.7)
RDW: 14.1 % (ref 11.5–14.6)

## 2011-12-05 LAB — LIPID PANEL
Cholesterol: 137 mg/dL (ref 0–200)
HDL: 52.8 mg/dL (ref >39.00)
Triglycerides: 73 mg/dL (ref 0.0–149.0)

## 2011-12-05 LAB — PSA: PSA: 2.4 ng/mL (ref 0.10–4.00)

## 2011-12-05 NOTE — Patient Instructions (Addendum)
Today we updated your med list in our EPIC system...    Continue your current medications the same...  Today we did your FASTING blood work...    We will call you w/ the results when avail...  Call for any questions & good luck w/ the renal biopsy...  Let's plan a routine follow up visit in 6 months.Marland KitchenMarland Kitchen

## 2011-12-05 NOTE — Progress Notes (Addendum)
Subjective:    Patient ID: William [name], male    DOB: 1928-08-05, 78 y.o.   MRN: [id number]  HPI 78 y/o WM here for a follow up visit... he has multiple medical problems as noted below... followed for HBP, Carotid vasc dis w/ hx of TIA & prev subdural hematoma, Chol, & prev surg for medullary thyroid carcinoma... his wife passed away from metastatic breast cancer 11/09...  ~  May 29, 2010:   He's had a good 27mo- feeling great he says...    BP controlled on Hyzaar & Cardura w/ BP today 122/64 & siilar at home;  he denies CP, palpit, SOB edema, and no cerebral ischemic symptoms...    Chol has been well controlled on Simva40 + Fish Oil & labs 8/11 reviewed (not fasting today).    He notes sl constip but relieved if he takes Miralax Prn;  last colonoscopy was 2007- neg x divertics. hems...    He continues to f/u w/ DrLove & now on Sinemet for his Parkinson's temor in right hand;  pt still golfs regularly &walks for exercise.  ~  November 29, 2010:  27mo ROV & he reports doing well, no new complaints or concerns & wonders what he's really doing here today; he golfs 3d per week & doing satis w/o CP, palpit, dizzy, SOB, etc...    BP controlled on meds, 120/80 today & tolerated well...    He continues on ASA 81mg  daily w/o cerebral ischemic symptoms etc; he is off Plavix ever since his SDH...    Chol remains well regulated on the Simva40, Fish Oil & diet rx...    Hx medullary thyroid cancer, followed by [facility], TFTs remain wnl...    He saw GU DrGrapey last week he said & PSA was ~3.1 by his hx...    Still followed by Autumn Patty for Neuro (& he reports consult at [facility] from DrScott- we do not have notes from them) w/ Tremor, Parkinson's features, on Sinemet 25-100 Tid; "I'm doing exercises"    NOTE: labs today look good x TSH sl over-suppressed at 0.23 & pt suggested to decr the Synthroid to 1 daily x 1/2 on MWF...  ~  June 06, 2011:  27mo ROV & [name] states he is doing satis, w/o new  complaints or concerns... He had a left lower lid ectropion repair 12/12 by DrZaldivar in [facility], [place].    BP remains controlled on meds; BP= 122/60 today & he remains asymptomatic w/o CP, palpit, SOB, edema, etc...    He remains on ASA81 & denies any cerebral ischemic symptoms; he has f/u appt soon w/ DrLove for f/u Parkinson's dis (on Sinemet)...    Chol looks good on Simva40 (see below)...     Hx medullary thyroid cancer, followed by DrDNewman, TSH today = 2.29 on Synthroid28mcg- 1x4d=TThSS & 1/2 x3d=MWF... We reviewed his problem list, meds, XRays & Labs today...  ~  December 05, 2011:  27mo ROV & [name] relates lifting a mattress several months ago w/ back pain> eval by Alinda Sierras, DrBrooks w/ MRI showing bulging discs at L4-5 & treated w/ Pred w/o benefit, then ESI and this helped some but he has persistent left leg symptoms (on Hydrocodone & Aleve) and can't play golf yet; he had second opinion DrHirsh for [employer] they are waiting on surg;  Incidental finding on the MRI was a renal lesion ==> referred to DrGrapey & they have consulted DrYamagata, IR for cyst drainage and bx of lesion (sched for  9/13- may need cryoablation if it's a cancer)...    BP remains controlled on Hyzaar & Cardura;  Chol remains at goals on Simva20;  Thyroid unchanged & stable on Synthroid Rx;  DrLove manages his Parkinson's & recently increased his Sinemet...    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         Problem List:  HYPERTENSION (ICD-401.9) - controlled on HYZAAR 100/25  daily, & CARDURA 8mg - taking 1/2 daily... ~  2/13:  BP= 122/60 & he denies CP, palpit, SOB, edema, etc... ~  8/13:  BP= 140/80 & he remains largely asymptomatic...  ATHEROSCLEROTIC VASCULAR DISEASE (ICD-440.9) - on ASA 81mg /d;  Prev Plavix was discontinued after his subdural hematoma in 2006. ~  Eval 2003 by [facility] for TIA showed cerebrovasc dis w/ R>L PCA stenoses...  HYPERLIPIDEMIA (ICD-272.4) - on SIMVASTATIN 40mg -taking 1/2 + FISH OIL 1000mg /d> doing well. ~  FLP 12/07 on Zocor showed TChol 137, HDL 47, LDL 70 ~  FLP 7/09 off med showed TChol 196, TG 95, HDL 49, LDL 128... rec> restart Zocor 40mg . ~  FLP 7/10 showed TChol 135, TG 47, HDL 53, LDL 73... He decr dose to 1/2 tab on his own... 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 he is s/p thyroidectomy for medullary carcinoma of the thyroid 6/02 by [facility]... ~  labs 7/09 showed TSH= 3.47 ~  labs 7/10 showed TSH= 2.95 ~  labs 8/11 showed TSH= 1.20 ~  Labs 8/12 showed TSH= 0.23 & pt rec to decr the Synthroid200 to only 1/2 on MWF.Marland Kitchen. ~  Labs 2/13 showed TSH= 2.29 on Synthroid200- taking 1x4d=TThSS and 1/2 x3d=MWF.Marland Kitchen. ~  Labs 8/13 showed TSH= 0.69... On same dose.  COLONIC POLYPS (ICD-211.3) - last colonoscopy 1/07 by DrSam showed divertics & hems only... f/u planned 50yrs.  BENIGN PROSTATIC HYPERTROPHY, WITH OBSTRUCTION (ICD-600.01) - he sees DrDavis for Urology once a year... he has a brother who had prostate cancer...  ~  PSA followed by [id number]... 7/10 pt reports PSA>5 (was 4.17 here) & Rx w/ Cipro... ~  he had f/u Urology eval DrGrapey 5/11- pt indicates that his PSA was OK... ~  Labs here 8/13 showed PSA= 2.40  BILAT RENAL LESIONS >> found incidentally 6/13 on MRI of Lumbar Spine  at [facility] office;  Bilat 10-11cm renal; cysts noted plus a 2.5cm exophytic right renal lesion along the inferior pole; he was referred to DrGrapey who repeated renal imaging at his office> the right renal lesion seems to come off the wall of the cyst; they have decided to ask DrYamagata of IR to do a cyst asp & nodule bx (sched for 9/13); this lesion would then be treated w/ cryoablation if  it proved to be a cancer... 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  TRANSIENT ISCHEMIC ATTACK (ICD-435.9) - hx of recurrent right brain TIA's in 2003... initially on ASA + Plavix and the Plavix was stopped after his subdural in 2006...  Hx of SUBDURAL HEMATOMA (ICD-432.1) - hosp 7/06 w/ left subdural hematoma req craniotomy by DrHirsh for evacuation... no known trauma- he was on ASA/ Plavix and developed a headache... he also take DILANTIN 100mg  Tid now per [facility] (for a partial seizure characterized by aphasia)... ~  7/10: pt tells me he has decreased his Dilantin to Bid and will f/u w/ DrLove. ~  8/11:  we don't have any recent notes from [facility], but pt is off the Dilantin rx.  TREMOR (ICD-781.0) - he notes mild tremor right hand & arm... eval from Neurology- DrLove- indicated prob early [name] and after a period of observation they started Mirapex (stopped due to hypotension), but changed to [facility] 2/12> dosed per DrLove... ~  8/11: pt tells me that he has started accupuncture treatments in [place]==> no benefit. ~  2/12:  pt indicates that DrLove stopped the Mirapex in favor of SINEMET 25/100- taking 1/2 Tid. ~  8/12:  Pt tells me he has seen DrScott, Neurology at [facility] (?referred by [facility], ?second opinion), we  don't have records, pt reports no change in meds (he considered entering a drug study). ~  He continues to f/u w/ DrLove every 4-21months... ~  8/13:  He indicates that Drlove recently increased the SINEMET 25/100 to 1 tab Tid...  DEGENERATIVE JOINT DISEASE (ICD-715.90) - he notes some knee discomfort & he takes Aleve & HYDROCODONE as needed... LBP >> injured back lifting mattress 5/13; eval by DrBrooks & DrHirsh w/ abn MRI (done by Ortho at their office) & disc herniation L4-5 by report; Pred w/o help, ESI helped some, using pain Rx & holding off on surg per DrHirsh...  Hx of CARCINOMA, SKIN, SQUAMOUS CELL (ICD-173.9) - s/p removal of skin cancer from his right forearm 11/06 by DrLupton...  Health Maintenance:  he had PNEUMONIA vaccines in 1998 & 1/11... gets yearly Flu vaccine every fall... OK TDAP 8/11 OV...   Past Surgical History  Procedure Date  . Cataract sugery   . Cholecystectomy   . Right inguinal hernia repair   . Thyroidectomy for medullary carcinoma   . Eye surgery   . Knee arthroscopy     Outpatient Encounter Prescriptions as of 12/05/2011  Medication Sig Dispense Refill  . carbidopa-levodopa (SINEMET) 25-100 MG per tablet Take as directed by Dr. Sandria Manly      . doxazosin (CARDURA) 8 MG tablet Take 1/2 tablet by mouth daily  45 tablet  3  . levothyroxine (SYNTHROID, LEVOTHROID) 200 MCG tablet Alternated 1 tablet one day and then 1/2 tablet the next      . losartan-hydrochlorothiazide (HYZAAR) 100-25 MG per tablet Take 1 tablet by mouth daily.  90 tablet  3  . simvastatin (ZOCOR) 40 MG tablet Take 1 tablet (40 mg total) by mouth at bedtime.  90 tablet  3  . tadalafil (CIALIS) 20 MG tablet Take 20 mg by mouth daily as needed.        Marland Kitchen aspirin  81 MG tablet Take 81 mg by mouth daily.        . fish oil-omega-3 fatty acids 1000 MG capsule Take 2 g by mouth daily.        . Multiple Vitamin (MULTIVITAMIN) capsule Take 1 capsule by mouth daily.          No Known  Allergies   Current Medications, Allergies, Past Medical History, Past Surgical History, Family History, and Social History were reviewed in [employer] record.    Review of Systems       See HPI - all other systems neg except as noted...       The patient complains of dyspnea on exertion, right hand tremor, and difficulty walking.  The patient denies anorexia, fever, weight loss, weight gain, vision loss, decreased hearing, hoarseness, chest pain, syncope, peripheral edema, prolonged cough, headaches, hemoptysis, abdominal pain, melena, hematochezia, severe indigestion/heartburn, hematuria, incontinence, muscle weakness, suspicious skin lesions, transient blindness, depression, unusual weight change, abnormal bleeding, enlarged lymph nodes, and angioedema.     Objective:   Physical Exam     WD, WN, 78 y/o WM in NAD... GENERAL:  Alert & oriented; pleasant & cooperative... HEENT:  [place]/AT, EOM-wnl, PERRLA, EACs-clear, TMs-wnl, NOSE-clear, THROAT-clear & wnl. NECK:  Supple w/ fairROM; no JVD; normal carotid impulses w/o bruits; thyroid area scar, no nodules palp; no lymphadenopathy. CHEST:  Clear to P & A; without wheezes/ rales/ or rhonchi heard... HEART:  Regular Rhythm; without murmurs/ rubs/ or gallops detected... ABDOMEN:  Soft & nontender; normal bowel sounds; no organomegaly or masses palpated... EXT: without deformities, mild arthritic changes; no varicose veins/ +venous insuffic/ no edema. NEURO:  CN's intact;  mod tremor right hand & arm at rest, ?early mask facies, still moves well. DERM:  No lesions noted; no rash etc...  RADIOLOGY DATA:  Reviewed in the EPIC EMR & discussed w/ the patient...  LABORATORY DATA:  Reviewed in the EPIC EMR & discussed w/ the patient...   Assessment & Plan:    HBP>  Controlled on meds, continue same...  PVD>  On ASA w/o cerebral ischemic symptoms, continue same...  HYPERLIPID>  On Simva20 + Fish Oil & FLPs have been at  goal, continue same...  Hx Medullary Thyroid Carcinoma>  S/p surg by DrNewman2002, on Synthroid - alternating 1-1/2 now & TSH is ok on this dose...  GI> remote hx polyp, divertics, hems>  Last colon 2007 by by DrSam & suggested f/u 89yrs...  GU>  Followed by DrGrapey now, RENAL LESION found incidentally on MRI Lumbar spine & w/u in progress...  NEURO> Hx TIA, Hx SDH, Tremor/ Parkinson's>  Followed by Autumn Patty & now DrScott by pt's hx...  DJD>  He uses OTC meds as needed & plays golf regularly...  Other medical issues as noted...   Patient's Medications  New Prescriptions   No medications on file  Previous Medications   ASPIRIN 81 MG TABLET    Take 81 mg by mouth daily.     CARBIDOPA-LEVODOPA (SINEMET) 25-100 MG PER TABLET    Take as directed by Dr. Sandria Manly   DOXAZOSIN (CARDURA) 8 MG TABLET    Take 1/2 tablet by mouth daily   FISH OIL-OMEGA-3 FATTY ACIDS 1000 MG CAPSULE    Take 2 g by mouth daily.     LEVOTHYROXINE (SYNTHROID, LEVOTHROID) 200 MCG TABLET    Alternated 1 tablet one day and then 1/2 tablet the next   LOSARTAN-HYDROCHLOROTHIAZIDE (HYZAAR) 100-25 MG PER TABLET    Take 1  tablet by mouth daily.   MULTIPLE VITAMIN (MULTIVITAMIN) CAPSULE    Take 1 capsule by mouth daily.     SIMVASTATIN (ZOCOR) 40 MG TABLET    Take 1 tablet (40 mg total) by mouth at bedtime.   TADALAFIL (CIALIS) 20 MG TABLET    Take 20 mg by mouth daily as needed.    Modified Medications   No medications on file  Discontinued Medications   No medications on file

## 2011-12-19 ENCOUNTER — Encounter (HOSPITAL_COMMUNITY): Payer: Self-pay | Admitting: Pharmacy Technician

## 2011-12-23 ENCOUNTER — Other Ambulatory Visit: Payer: Self-pay | Admitting: Radiology

## 2011-12-24 ENCOUNTER — Ambulatory Visit (HOSPITAL_COMMUNITY)
Admission: RE | Admit: 2011-12-24 | Discharge: 2011-12-24 | Disposition: A | Discharge status: Home / Self Care | Payer: Medicare Other | Source: Ambulatory Visit | Attending: Interventional Radiology | Admitting: Interventional Radiology

## 2011-12-24 ENCOUNTER — Other Ambulatory Visit (HOSPITAL_COMMUNITY): Payer: Self-pay | Admitting: Interventional Radiology

## 2011-12-24 ENCOUNTER — Encounter (HOSPITAL_COMMUNITY): Payer: Self-pay

## 2011-12-24 DIAGNOSIS — I10 Essential (primary) hypertension: Secondary | ICD-10-CM | POA: Insufficient documentation

## 2011-12-24 DIAGNOSIS — E785 Hyperlipidemia, unspecified: Secondary | ICD-10-CM | POA: Insufficient documentation

## 2011-12-24 DIAGNOSIS — Q619 Cystic kidney disease, unspecified: Secondary | ICD-10-CM | POA: Insufficient documentation

## 2011-12-24 DIAGNOSIS — N2889 Other specified disorders of kidney and ureter: Secondary | ICD-10-CM

## 2011-12-24 DIAGNOSIS — Z8673 Personal history of transient ischemic attack (TIA), and cerebral infarction without residual deficits: Secondary | ICD-10-CM | POA: Insufficient documentation

## 2011-12-24 DIAGNOSIS — C649 Malignant neoplasm of unspecified kidney, except renal pelvis: Secondary | ICD-10-CM | POA: Insufficient documentation

## 2011-12-24 LAB — CBC
Hemoglobin: 15.3 g/dL (ref 13.0–17.0)
RBC: 5.09 MIL/uL (ref 4.22–5.81)
WBC: 3.7 10*3/uL — ABNORMAL LOW (ref 4.0–10.5)

## 2011-12-24 LAB — PROTIME-INR
INR: 1.06 (ref 0.00–1.49)
Prothrombin Time: 14 seconds (ref 11.6–15.2)

## 2011-12-24 LAB — BASIC METABOLIC PANEL
GFR calc Af Amer: 59 mL/min — ABNORMAL LOW (ref >90)
GFR calc non Af Amer: 51 mL/min — ABNORMAL LOW (ref >90)
Potassium: 3.8 mEq/L (ref 3.5–5.1)
Sodium: 137 mEq/L (ref 135–145)

## 2011-12-24 LAB — APTT: aPTT: 31 seconds (ref 24–37)

## 2011-12-24 MED ORDER — MIDAZOLAM HCL 5 MG/5ML IJ SOLN
INTRAMUSCULAR | Status: DC | PRN
  Administered 2011-12-24: 2 mg via INTRAVENOUS

## 2011-12-24 MED ORDER — HYDROCODONE-ACETAMINOPHEN 5-325 MG PO TABS: 1.0000 | ORAL_TABLET | ORAL | Status: DC | PRN

## 2011-12-24 MED ORDER — SODIUM CHLORIDE 0.9 % IV SOLN
INTRAVENOUS | Status: DC
  Administered 2011-12-24: 500 mL via INTRAVENOUS

## 2011-12-24 MED ORDER — FENTANYL CITRATE 0.05 MG/ML IJ SOLN
INTRAMUSCULAR | Status: DC | PRN
  Administered 2011-12-24: 100 ug via INTRAVENOUS

## 2011-12-24 NOTE — H&P (Signed)
William [name] is an 78 y.o. male.   Chief Complaint: right kidney cyst/mass HPI: Patient with history of low back pain and recent imaging studies revealing an incidental finding of a large 10 cm right renal cyst with an exophytic nodule bordering the medial wall , measuring approximately 2 cm. He presents today for CT guided aspiration of the renal cyst and core biopsy of the adjacent nodule.  Past Medical History  Diagnosis Date  . Hypertension   . Atherosclerotic vascular disease   . Hyperlipidemia   . Hx of colonic polyps   . Benign prostatic hypertrophy with urinary obstruction   . TIA (transient ischemic attack)   . Subdural hematoma   . Tremor   . DJD (degenerative joint disease)   . Gout   . Malignant neoplasm of thyroid gland   . Primary skin squamous cell carcinoma     Past Surgical History  Procedure Date  . Cataract sugery   . Cholecystectomy   . Right inguinal hernia repair   . Thyroidectomy for medullary carcinoma   . Eye surgery   . Knee arthroscopy     History reviewed. No pertinent family history. Social History:  reports that he quit smoking about 46 years ago. He has never used smokeless tobacco. He reports that he drinks alcohol. His drug history not on file.  Allergies: No Known Allergies  Current outpatient prescriptions:aspirin 81 MG tablet, Take 81 mg by mouth daily.  , Disp: , Rfl: ;  carbidopa-levodopa (SINEMET) 25-100 MG per tablet, Take 1 tablet by mouth 3 (three) times daily. Take as directed by Dr. Sandria Manly, Disp: , Rfl: ;  doxazosin (CARDURA) 8 MG tablet, Take 4 mg by mouth at bedtime. Take 1/2 tablet by mouth daily, Disp: , Rfl: ;  fish oil-omega-3 fatty acids 1000 MG capsule, Take 2 g by mouth daily.  , Disp: , Rfl:  levothyroxine (SYNTHROID, LEVOTHROID) 200 MCG tablet, Take 100-200 mcg by mouth every morning. Alternated 1 tablet one day and then 1/2 tablet the next, Disp: , Rfl: ;  losartan-hydrochlorothiazide (HYZAAR) 100-25 MG per tablet, Take  1 tablet by mouth every morning., Disp: , Rfl: ;  Multiple Vitamin (MULTIVITAMIN) capsule, Take 1 capsule by mouth daily.  , Disp: , Rfl:  simvastatin (ZOCOR) 40 MG tablet, Take 20 mg by mouth at bedtime., Disp: , Rfl: ;  tadalafil (CIALIS) 20 MG tablet, Take 20 mg by mouth daily as needed.  , Disp: , Rfl:  Current facility-administered medications:0.9 %  sodium chloride infusion, , Intravenous, Continuous, D Jeananne Rama, PA  Results for orders placed during the hospital encounter of 12/24/11  CBC      Component Value Range   WBC 3.7 (*) 4.0 - 10.5 K/uL   RBC 5.09  4.22 - 5.81 MIL/uL   Hemoglobin 15.3  13.0 - 17.0 g/dL   HCT 09.8  11.9 - 14.7 %   MCV 90.8  78.0 - 100.0 fL   MCH 30.1  26.0 - 34.0 pg   MCHC 33.1  30.0 - 36.0 g/dL   RDW 82.9  56.2 - 13.0 %   Platelets 138 (*) 150 - 400 K/uL    No results found.  Review of Systems  Constitutional: Negative for fever and chills.  Respiratory: Negative for cough and shortness of breath.   Cardiovascular: Negative for chest pain.  Gastrointestinal: Negative for nausea, vomiting, abdominal pain and blood in stool.  Genitourinary: Positive for urgency. Negative for hematuria.  Musculoskeletal: Positive for back pain.  Neurological: Positive for tremors. Negative for headaches.       Tremor rt hand secondary to Parkinson's  Endo/Heme/Allergies: Does not bruise/bleed easily.   Vitals;   BP  165/80           HR  58         O2 SATS  98%RA         TEMP 97.7          R  16 Physical Exam  Constitutional: He is oriented to person, place, and time. He appears well-developed and well-nourished.  Cardiovascular: Regular rhythm.        bradycardic  Respiratory: Effort normal and breath sounds normal.  GI: Soft. Bowel sounds are normal. There is no tenderness.  Musculoskeletal: Normal range of motion. He exhibits no edema.       Tremor rt hand  Neurological: He is alert and oriented to person, place, and time.     Assessment/Plan Patient with  large 10 cm right renal cyst with associated solid nodule bordering the medial wall ; plan is for CT guided aspiration of the right renal cyst and core biopsy of the adjacent solid nodule. Details/risks of the procedure d/w pt/wife with their understanding and consent.  Hurshel Bouillon,D KEVIN 12/24/2011, 10:17 AM

## 2011-12-24 NOTE — Procedures (Signed)
Procedure:  1) Right renal cyst aspiration; 2) Right renal mass biopsy Findings:  CT guidance in prone position.  10 cm dominant right renal cyst aspirated w/ 15 cm Yueh cath w/ return of 550 mL of clear, serous fluid.  Fluid sent for cytologic analysis. Core biopsy of solid mass via 17 G needle.  18 G core samples x 3. No complications.

## 2011-12-24 NOTE — H&P (Signed)
Agree 

## 2012-01-14 ENCOUNTER — Other Ambulatory Visit: Payer: Self-pay | Admitting: Interventional Radiology

## 2012-01-14 ENCOUNTER — Ambulatory Visit
Admission: RE | Admit: 2012-01-14 | Discharge: 2012-01-14 | Disposition: A | Discharge status: Home / Self Care | Payer: Medicare Other | Source: Ambulatory Visit | Attending: Interventional Radiology | Admitting: Interventional Radiology

## 2012-01-14 DIAGNOSIS — N2889 Other specified disorders of kidney and ureter: Secondary | ICD-10-CM

## 2012-01-15 ENCOUNTER — Telehealth: Payer: Self-pay | Admitting: Interventional Radiology

## 2012-01-28 ENCOUNTER — Ambulatory Visit (INDEPENDENT_AMBULATORY_CARE_PROVIDER_SITE_OTHER): Payer: Medicare Other

## 2012-01-28 DIAGNOSIS — Z23 Encounter for immunization: Secondary | ICD-10-CM

## 2012-02-26 ENCOUNTER — Telehealth: Payer: Self-pay | Admitting: Pulmonary Disease

## 2012-02-26 NOTE — Telephone Encounter (Signed)
Received 7 pages [employer] Sent to Dr. Kriste Basque. 02/26/12/sd

## 2012-03-18 ENCOUNTER — Other Ambulatory Visit: Payer: Self-pay | Admitting: Emergency Medicine

## 2012-03-18 ENCOUNTER — Other Ambulatory Visit: Payer: Self-pay | Admitting: Interventional Radiology

## 2012-03-18 DIAGNOSIS — N2889 Other specified disorders of kidney and ureter: Secondary | ICD-10-CM

## 2012-03-19 LAB — CREATININE WITH EST GFR: GFR, Est Non African American: 42 mL/min — ABNORMAL LOW

## 2012-03-19 LAB — BUN: BUN: 15 mg/dL (ref 6–23)

## 2012-04-13 ENCOUNTER — Other Ambulatory Visit: Payer: Self-pay | Admitting: Pulmonary Disease

## 2012-04-13 MED ORDER — DOXAZOSIN MESYLATE 8 MG PO TABS: 4.0000 mg | ORAL_TABLET | Freq: Every day | ORAL | Status: DC

## 2012-04-13 NOTE — Telephone Encounter (Signed)
[facility] mail requesting 90 day supply doxazosin 8 mg <> take 1/2 tablet by mouth daily

## 2012-04-13 NOTE — Telephone Encounter (Signed)
OK PER sn  RX SENT

## 2012-04-16 ENCOUNTER — Ambulatory Visit (HOSPITAL_COMMUNITY)
Admission: RE | Admit: 2012-04-16 | Discharge: 2012-04-16 | Disposition: A | Discharge status: Home / Self Care | Payer: Medicare Other | Source: Ambulatory Visit | Attending: Interventional Radiology | Admitting: Interventional Radiology

## 2012-04-16 ENCOUNTER — Ambulatory Visit
Admission: RE | Admit: 2012-04-16 | Discharge: 2012-04-16 | Disposition: A | Discharge status: Home / Self Care | Payer: Medicare Other | Source: Ambulatory Visit | Attending: Interventional Radiology | Admitting: Interventional Radiology

## 2012-04-16 VITALS — BP 163/78 | HR 53 | Temp 97.7°F | Resp 16 | Ht 76.0 in | Wt 183.0 lb

## 2012-04-16 DIAGNOSIS — I2510 Atherosclerotic heart disease of native coronary artery without angina pectoris: Secondary | ICD-10-CM | POA: Insufficient documentation

## 2012-04-16 DIAGNOSIS — N2889 Other specified disorders of kidney and ureter: Secondary | ICD-10-CM

## 2012-04-16 DIAGNOSIS — I714 Abdominal aortic aneurysm, without rupture, unspecified: Secondary | ICD-10-CM | POA: Insufficient documentation

## 2012-04-16 DIAGNOSIS — N289 Disorder of kidney and ureter, unspecified: Secondary | ICD-10-CM | POA: Insufficient documentation

## 2012-04-16 MED ORDER — IOHEXOL 300 MG/ML  SOLN
100.0000 mL | Freq: Once | INTRAMUSCULAR | Status: AC | PRN
  Administered 2012-04-16: 100 mL via INTRAVENOUS

## 2012-04-16 NOTE — Progress Notes (Signed)
Patient ID: William [name], male   DOB: January 11, 1929, 79 y.o.   MRN: [id number]  ESTABLISHED PATIENT OFFICE VISIT  Chief Complaint: Follow-up of right renal oncocytic neoplasm.  History: Mr. Kersh is status post core biopsy of a right renal mass revealing evidence of oncocytic neoplasm with differential including oncocytoma and chromophobe carcinoma. He remains asymptomatic with respect to the mass and has no urinary symptoms other than chronic urinary urgency and increased frequency.  Review of Systems: No fever, chills, flank pain, hematuria or dysuria.  Exam: Vital signs: Blood pressure 163/78, pulse 53, respirations 16, temperature 97.7, oxygen saturation 99% on room air. General: No distress. Abdomen: Soft and nontender. No right flank tenderness.  Imaging: CT was performed today in follow up with and without contrast. The solid lesion emanating from the inferior aspect of the right kidney currently measures approximately 2.9 x 2.5 cm with previous dimensions in July of approximately 2.3 x 2.1 cm. At the time of the biopsy procedure on 12/24/2011, unenhanced CT showed maximal diameter of approximately 2.6 - 2.7 cm. Based on repeat measurements I made myself on the imaging studies, the lesion has grown approximately 5 mm in diameter in the interval between 10/23/2011 and 04/16/2012.  Assessment and Plan: I met with Mr. Spengler and his wife. We discussed the pros and cons of now treating the right renal neoplasm with percutaneous cryoablation based on evidence of growth over the last 6 months. By imaging, the adjacent dominant cyst remains largely decompressed and the solid tumor is no longer in close proximity to the ureter. Given evidence of growth, the patient would like to have the tumor treated by cryoablation. There was difficulty in intubating the patient in 2006 at the time of neurosurgical decompression of a subdural hematoma. The patient has had prior  thyroidectomy. In planning for the ablation procedure, we will certainly make Anesthesia aware of prior difficulty in intubation so that plans can be made at that time for possible fiberoptic assisted intubation. Mr. Ching would like to proceed with the scheduling process. We will check with insurance for any pre-certification necessary. I would anticipate being able to treat him in early February.

## 2012-04-21 ENCOUNTER — Telehealth: Payer: Self-pay | Admitting: Emergency Medicine

## 2012-04-21 NOTE — Telephone Encounter (Signed)
CALLED PT TO MAKE AWARE THAT NO AUTHO NEEDED FOR CRYOABLATION, AND TINA AT [facility] WILL CONTACT HIM ONCE THE FEB. SCHEDULE IS AVA.

## 2012-04-24 ENCOUNTER — Encounter: Payer: Self-pay | Admitting: Gastroenterology

## 2012-05-04 ENCOUNTER — Telehealth: Payer: Self-pay | Admitting: Emergency Medicine

## 2012-05-04 NOTE — Telephone Encounter (Signed)
PT CALLED TO STATE THAT DR [facility] WANTS TO PROCEED WITH HIS BACK SURGERY 1ST, THINKS ITS MORE PRESSING.  WILL CALL BACK AFTER HE IS HEALED  & READY TO SET UP ABLATION PROCEDURE. WILL NOTIFY TINA AT [facility] AND DR Fredia Sorrow.

## 2012-05-05 ENCOUNTER — Other Ambulatory Visit: Payer: Self-pay | Admitting: Neurosurgery

## 2012-05-05 DIAGNOSIS — IMO0002 Reserved for concepts with insufficient information to code with codable children: Secondary | ICD-10-CM

## 2012-05-05 DIAGNOSIS — M5106 Intervertebral disc disorders with myelopathy, lumbar region: Secondary | ICD-10-CM

## 2012-05-07 ENCOUNTER — Ambulatory Visit
Admission: RE | Admit: 2012-05-07 | Discharge: 2012-05-07 | Disposition: A | Discharge status: Home / Self Care | Payer: Medicare Other | Source: Ambulatory Visit | Attending: Neurosurgery | Admitting: Neurosurgery

## 2012-05-07 DIAGNOSIS — IMO0002 Reserved for concepts with insufficient information to code with codable children: Secondary | ICD-10-CM

## 2012-05-07 DIAGNOSIS — M5106 Intervertebral disc disorders with myelopathy, lumbar region: Secondary | ICD-10-CM

## 2012-05-11 ENCOUNTER — Other Ambulatory Visit: Payer: Self-pay | Admitting: Neurosurgery

## 2012-05-13 ENCOUNTER — Encounter (HOSPITAL_COMMUNITY): Payer: Self-pay

## 2012-05-13 ENCOUNTER — Encounter (HOSPITAL_COMMUNITY): Payer: Self-pay | Admitting: Pharmacy Technician

## 2012-05-13 MED ORDER — CEFAZOLIN SODIUM-DEXTROSE 2-3 GM-% IV SOLR
2.0000 g | INTRAVENOUS | Status: AC
  Administered 2012-05-14: 2 g via INTRAVENOUS
  Filled 2012-05-13: qty 50

## 2012-05-13 NOTE — Progress Notes (Signed)
Contacted Lin with [facility] to request anesthesia records for patients 2006 surgery.

## 2012-05-13 NOTE — Progress Notes (Signed)
05/13/12 1507  OBSTRUCTIVE SLEEP APNEA  Have you ever been diagnosed with sleep apnea through a sleep study? No  Do you snore loudly (loud enough to be heard through closed doors)?  0  Do you often feel tired, fatigued, or sleepy during the daytime? 0  Has anyone observed you stop breathing during your sleep? 1  Do you have, or are you being treated for high blood pressure? 1  BMI more than 35 kg/m2? 0  Age over 77 years old? 1  Neck circumference greater than 40 cm/18 inches? 0 (17 inches)  Gender: 1  Obstructive Sleep Apnea Score 4   Score 4 or greater  Results sent to PCP

## 2012-05-14 ENCOUNTER — Ambulatory Visit (HOSPITAL_COMMUNITY): Payer: Medicare Other

## 2012-05-14 ENCOUNTER — Observation Stay (HOSPITAL_COMMUNITY)
Admission: RE | Admit: 2012-05-14 | Discharge: 2012-05-15 | Disposition: A | Discharge status: Home / Self Care | Payer: Medicare Other | Source: Ambulatory Visit | Attending: Neurosurgery | Admitting: Neurosurgery

## 2012-05-14 ENCOUNTER — Ambulatory Visit (HOSPITAL_COMMUNITY): Payer: Medicare Other | Admitting: Certified Registered Nurse Anesthetist

## 2012-05-14 ENCOUNTER — Encounter (HOSPITAL_COMMUNITY): Payer: Self-pay | Admitting: Certified Registered Nurse Anesthetist

## 2012-05-14 ENCOUNTER — Encounter (HOSPITAL_COMMUNITY)
Admission: RE | Disposition: A | Discharge status: Home / Self Care | Payer: Self-pay | Source: Ambulatory Visit | Attending: Neurosurgery

## 2012-05-14 DIAGNOSIS — I10 Essential (primary) hypertension: Secondary | ICD-10-CM | POA: Insufficient documentation

## 2012-05-14 DIAGNOSIS — M5106 Intervertebral disc disorders with myelopathy, lumbar region: Principal | ICD-10-CM | POA: Insufficient documentation

## 2012-05-14 HISTORY — DX: Parkinson's disease: G20

## 2012-05-14 HISTORY — DX: Parkinson's disease without dyskinesia, without mention of fluctuations: G20.A1

## 2012-05-14 HISTORY — DX: Other complications of anesthesia, initial encounter: T88.59XA

## 2012-05-14 HISTORY — DX: Adverse effect of unspecified anesthetic, initial encounter: T41.45XA

## 2012-05-14 HISTORY — DX: Neoplasm of unspecified behavior of unspecified kidney: D49.519

## 2012-05-14 HISTORY — PX: LUMBAR LAMINECTOMY/DECOMPRESSION MICRODISCECTOMY: SHX5026

## 2012-05-14 HISTORY — DX: Pneumonia, unspecified organism: J18.9

## 2012-05-14 LAB — CBC WITH DIFFERENTIAL/PLATELET
Basophils Relative: 1 % (ref 0–1)
Eosinophils Absolute: 0.1 10*3/uL (ref 0.0–0.7)
Eosinophils Relative: 2 % (ref 0–5)
Lymphs Abs: 0.9 10*3/uL (ref 0.7–4.0)
MCH: 30.4 pg (ref 26.0–34.0)
MCHC: 32.7 g/dL (ref 30.0–36.0)
MCV: 92.9 fL (ref 78.0–100.0)
Neutrophils Relative %: 71 % (ref 43–77)
Platelets: 132 10*3/uL — ABNORMAL LOW (ref 150–400)
RBC: 5.37 MIL/uL (ref 4.22–5.81)

## 2012-05-14 LAB — PROTIME-INR
INR: 1.06 (ref 0.00–1.49)
Prothrombin Time: 13.7 seconds (ref 11.6–15.2)

## 2012-05-14 LAB — BASIC METABOLIC PANEL
BUN: 17 mg/dL (ref 6–23)
CO2: 34 mEq/L — ABNORMAL HIGH (ref 19–32)
Calcium: 9 mg/dL (ref 8.4–10.5)
Creatinine, Ser: 1.36 mg/dL — ABNORMAL HIGH (ref 0.50–1.35)
GFR calc non Af Amer: 47 mL/min — ABNORMAL LOW (ref >90)
Glucose, Bld: 101 mg/dL — ABNORMAL HIGH (ref 70–99)

## 2012-05-14 LAB — SURGICAL PCR SCREEN: MRSA, PCR: NEGATIVE

## 2012-05-14 SURGERY — LUMBAR LAMINECTOMY/DECOMPRESSION MICRODISCECTOMY 1 LEVEL
Anesthesia: General | Site: Back | Laterality: Left | Wound class: Clean

## 2012-05-14 MED ORDER — PHENYLEPHRINE HCL 10 MG/ML IJ SOLN
INTRAMUSCULAR | Status: DC | PRN
  Administered 2012-05-14: 40 ug via INTRAVENOUS
  Administered 2012-05-14: 80 ug via INTRAVENOUS
  Administered 2012-05-14: 40 ug via INTRAVENOUS

## 2012-05-14 MED ORDER — SODIUM CHLORIDE 0.9 % IV SOLN
INTRAVENOUS | Status: AC
  Filled 2012-05-14: qty 500

## 2012-05-14 MED ORDER — LACTATED RINGERS IV SOLN: INTRAVENOUS | Status: DC

## 2012-05-14 MED ORDER — DOCUSATE SODIUM 100 MG PO CAPS
100.0000 mg | ORAL_CAPSULE | Freq: Two times a day (BID) | ORAL | Status: DC
  Administered 2012-05-14: 100 mg via ORAL
  Filled 2012-05-14: qty 1

## 2012-05-14 MED ORDER — HYDROCHLOROTHIAZIDE 25 MG PO TABS
25.0000 mg | ORAL_TABLET | Freq: Every day | ORAL | Status: DC
  Filled 2012-05-14: qty 1

## 2012-05-14 MED ORDER — BACITRACIN 50000 UNITS IM SOLR
INTRAMUSCULAR | Status: AC
  Filled 2012-05-14: qty 1

## 2012-05-14 MED ORDER — HYDROCODONE-ACETAMINOPHEN 5-325 MG PO TABS: 1.0000 | ORAL_TABLET | ORAL | Status: DC | PRN

## 2012-05-14 MED ORDER — ACETAMINOPHEN 325 MG PO TABS: 650.0000 mg | ORAL_TABLET | ORAL | Status: DC | PRN

## 2012-05-14 MED ORDER — SUCCINYLCHOLINE CHLORIDE 20 MG/ML IJ SOLN
INTRAMUSCULAR | Status: DC | PRN
  Administered 2012-05-14: 100 mg via INTRAVENOUS

## 2012-05-14 MED ORDER — LEVOTHYROXINE SODIUM 100 MCG PO TABS: 100.0000 ug | ORAL_TABLET | Freq: Every day | ORAL | Status: DC

## 2012-05-14 MED ORDER — MUPIROCIN 2 % EX OINT
TOPICAL_OINTMENT | CUTANEOUS | Status: AC
  Filled 2012-05-14: qty 22

## 2012-05-14 MED ORDER — CYCLOBENZAPRINE HCL 10 MG PO TABS: 10.0000 mg | ORAL_TABLET | Freq: Three times a day (TID) | ORAL | Status: DC | PRN

## 2012-05-14 MED ORDER — LEVOTHYROXINE SODIUM 200 MCG PO TABS
200.0000 ug | ORAL_TABLET | ORAL | Status: DC
  Filled 2012-05-14: qty 1

## 2012-05-14 MED ORDER — MIDAZOLAM HCL 5 MG/5ML IJ SOLN
INTRAMUSCULAR | Status: DC | PRN
  Administered 2012-05-14: 1 mg via INTRAVENOUS

## 2012-05-14 MED ORDER — NEOSTIGMINE METHYLSULFATE 1 MG/ML IJ SOLN
INTRAMUSCULAR | Status: DC | PRN
  Administered 2012-05-14: 4 mg via INTRAVENOUS

## 2012-05-14 MED ORDER — CARBIDOPA-LEVODOPA 25-100 MG PO TABS
1.0000 | ORAL_TABLET | Freq: Three times a day (TID) | ORAL | Status: DC
  Administered 2012-05-14 (×2): 1 via ORAL
  Filled 2012-05-14 (×5): qty 1

## 2012-05-14 MED ORDER — LIDOCAINE HCL (CARDIAC) 20 MG/ML IV SOLN
INTRAVENOUS | Status: DC | PRN
  Administered 2012-05-14: 80 mg via INTRAVENOUS

## 2012-05-14 MED ORDER — 0.9 % SODIUM CHLORIDE (POUR BTL) OPTIME
TOPICAL | Status: DC | PRN
  Administered 2012-05-14: 1000 mL

## 2012-05-14 MED ORDER — ROCURONIUM BROMIDE 100 MG/10ML IV SOLN
INTRAVENOUS | Status: DC | PRN
  Administered 2012-05-14: 10 mg via INTRAVENOUS
  Administered 2012-05-14: 40 mg via INTRAVENOUS

## 2012-05-14 MED ORDER — GLYCOPYRROLATE 0.2 MG/ML IJ SOLN
INTRAMUSCULAR | Status: DC | PRN
  Administered 2012-05-14: 0.6 mg via INTRAVENOUS
  Administered 2012-05-14: 0.2 mg via INTRAVENOUS

## 2012-05-14 MED ORDER — OXYCODONE-ACETAMINOPHEN 5-325 MG PO TABS: 1.0000 | ORAL_TABLET | ORAL | Status: DC | PRN

## 2012-05-14 MED ORDER — ONDANSETRON HCL 4 MG/2ML IJ SOLN
INTRAMUSCULAR | Status: DC | PRN
  Administered 2012-05-14: 4 mg via INTRAVENOUS

## 2012-05-14 MED ORDER — PROMETHAZINE HCL 25 MG/ML IJ SOLN: 12.5000 mg | INTRAMUSCULAR | Status: DC | PRN

## 2012-05-14 MED ORDER — METHOCARBAMOL 100 MG/ML IJ SOLN
500.0000 mg | Freq: Four times a day (QID) | INTRAVENOUS | Status: DC | PRN
  Filled 2012-05-14: qty 5

## 2012-05-14 MED ORDER — LOSARTAN POTASSIUM 50 MG PO TABS
100.0000 mg | ORAL_TABLET | Freq: Every day | ORAL | Status: DC
Start: 2012-05-15 — End: 2012-05-15
  Filled 2012-05-14: qty 2

## 2012-05-14 MED ORDER — SODIUM CHLORIDE 0.9 % IJ SOLN
3.0000 mL | Freq: Two times a day (BID) | INTRAMUSCULAR | Status: DC
  Administered 2012-05-14: 3 mL via INTRAVENOUS

## 2012-05-14 MED ORDER — ADULT MULTIVITAMIN W/MINERALS CH
1.0000 | ORAL_TABLET | Freq: Every day | ORAL | Status: DC
  Administered 2012-05-14: 1 via ORAL
  Filled 2012-05-14 (×2): qty 1

## 2012-05-14 MED ORDER — HEMOSTATIC AGENTS (NO CHARGE) OPTIME
TOPICAL | Status: DC | PRN
  Administered 2012-05-14: 1 via TOPICAL

## 2012-05-14 MED ORDER — MORPHINE SULFATE 2 MG/ML IJ SOLN: 1.0000 mg | INTRAMUSCULAR | Status: DC | PRN

## 2012-05-14 MED ORDER — ONDANSETRON HCL 4 MG/2ML IJ SOLN: 4.0000 mg | Freq: Four times a day (QID) | INTRAMUSCULAR | Status: DC | PRN

## 2012-05-14 MED ORDER — LEVOTHYROXINE SODIUM 100 MCG PO TABS
100.0000 ug | ORAL_TABLET | ORAL | Status: DC
  Administered 2012-05-15: 100 ug via ORAL
  Filled 2012-05-14: qty 1

## 2012-05-14 MED ORDER — FENTANYL CITRATE 0.05 MG/ML IJ SOLN: 25.0000 ug | INTRAMUSCULAR | Status: DC | PRN

## 2012-05-14 MED ORDER — ONDANSETRON HCL 4 MG/2ML IJ SOLN: 4.0000 mg | INTRAMUSCULAR | Status: DC | PRN

## 2012-05-14 MED ORDER — LOSARTAN POTASSIUM-HCTZ 100-25 MG PO TABS
1.0000 | ORAL_TABLET | Freq: Every day | ORAL | Status: DC
Start: 2012-05-14 — End: 2012-05-14

## 2012-05-14 MED ORDER — FENTANYL CITRATE 0.05 MG/ML IJ SOLN
INTRAMUSCULAR | Status: DC | PRN
  Administered 2012-05-14 (×3): 50 ug via INTRAVENOUS

## 2012-05-14 MED ORDER — SODIUM CHLORIDE 0.9 % IR SOLN
Status: DC | PRN
  Administered 2012-05-14: 12:00:00

## 2012-05-14 MED ORDER — PROPOFOL 10 MG/ML IV BOLUS
INTRAVENOUS | Status: DC | PRN
  Administered 2012-05-14: 180 mg via INTRAVENOUS

## 2012-05-14 MED ORDER — THROMBIN 5000 UNITS EX SOLR
CUTANEOUS | Status: DC | PRN
  Administered 2012-05-14 (×2): 5000 [IU] via TOPICAL

## 2012-05-14 MED ORDER — METHOCARBAMOL 500 MG PO TABS: 500.0000 mg | ORAL_TABLET | Freq: Four times a day (QID) | ORAL | Status: DC | PRN

## 2012-05-14 MED ORDER — ACETAMINOPHEN 650 MG RE SUPP: 650.0000 mg | RECTAL | Status: DC | PRN

## 2012-05-14 MED ORDER — LIDOCAINE-EPINEPHRINE 1 %-1:100000 IJ SOLN
INTRAMUSCULAR | Status: DC | PRN
  Administered 2012-05-14: 10 mL

## 2012-05-14 MED ORDER — TRAMADOL HCL 50 MG PO TABS
50.0000 mg | ORAL_TABLET | Freq: Four times a day (QID) | ORAL | Status: DC | PRN
  Filled 2012-05-14: qty 1

## 2012-05-14 MED ORDER — EPHEDRINE SULFATE 50 MG/ML IJ SOLN
INTRAMUSCULAR | Status: DC | PRN
  Administered 2012-05-14 (×2): 5 mg via INTRAVENOUS
  Administered 2012-05-14: 10 mg via INTRAVENOUS
  Administered 2012-05-14: 5 mg via INTRAVENOUS

## 2012-05-14 MED ORDER — OXYCODONE HCL 5 MG/5ML PO SOLN
5.0000 mg | Freq: Once | ORAL | Status: DC | PRN
Start: 2012-05-14 — End: 2012-05-14

## 2012-05-14 MED ORDER — PROMETHAZINE HCL 25 MG PO TABS: 12.5000 mg | ORAL_TABLET | ORAL | Status: DC | PRN

## 2012-05-14 MED ORDER — KETOROLAC TROMETHAMINE 30 MG/ML IJ SOLN
30.0000 mg | Freq: Four times a day (QID) | INTRAMUSCULAR | Status: DC
  Administered 2012-05-14 – 2012-05-15 (×2): 30 mg via INTRAVENOUS
  Filled 2012-05-14 (×3): qty 1

## 2012-05-14 MED ORDER — BISACODYL 10 MG RE SUPP: 10.0000 mg | Freq: Every day | RECTAL | Status: DC | PRN

## 2012-05-14 MED ORDER — LACTATED RINGERS IV SOLN
INTRAVENOUS | Status: DC | PRN
  Administered 2012-05-14 (×2): via INTRAVENOUS

## 2012-05-14 MED ORDER — SIMVASTATIN 20 MG PO TABS
20.0000 mg | ORAL_TABLET | Freq: Every evening | ORAL | Status: DC
  Administered 2012-05-14: 20 mg via ORAL
  Filled 2012-05-14 (×2): qty 1

## 2012-05-14 MED ORDER — CEFAZOLIN SODIUM 1-5 GM-% IV SOLN
1.0000 g | Freq: Three times a day (TID) | INTRAVENOUS | Status: DC
  Administered 2012-05-14 – 2012-05-15 (×2): 1 g via INTRAVENOUS
  Filled 2012-05-14 (×3): qty 50

## 2012-05-14 MED ORDER — ZOLPIDEM TARTRATE 5 MG PO TABS: 5.0000 mg | ORAL_TABLET | Freq: Every evening | ORAL | Status: DC | PRN

## 2012-05-14 MED ORDER — SODIUM CHLORIDE 0.9 % IJ SOLN: 3.0000 mL | INTRAMUSCULAR | Status: DC | PRN

## 2012-05-14 MED ORDER — DOXAZOSIN MESYLATE 4 MG PO TABS
4.0000 mg | ORAL_TABLET | Freq: Every day | ORAL | Status: DC
  Filled 2012-05-14: qty 1

## 2012-05-14 MED ORDER — OXYCODONE HCL 5 MG PO TABS: 5.0000 mg | ORAL_TABLET | Freq: Once | ORAL | Status: DC | PRN

## 2012-05-14 MED ORDER — POLYETHYLENE GLYCOL 3350 17 G PO PACK
17.0000 g | PACK | Freq: Every day | ORAL | Status: DC | PRN
  Filled 2012-05-14: qty 1

## 2012-05-14 SURGICAL SUPPLY — 55 items
APL SKNCLS STERI-STRIP NONHPOA (GAUZE/BANDAGES/DRESSINGS) ×1
BAG DECANTER FOR FLEXI CONT (MISCELLANEOUS) ×2 IMPLANT
BENZOIN TINCTURE PRP APPL 2/3 (GAUZE/BANDAGES/DRESSINGS) ×2 IMPLANT
BLADE SURG ROTATE 9660 (MISCELLANEOUS) IMPLANT
BUR ROUND FLUTED 5 RND (BURR) ×2 IMPLANT
CANISTER SUCTION 2500CC (MISCELLANEOUS) ×2 IMPLANT
CLOTH BEACON ORANGE TIMEOUT ST (SAFETY) ×2 IMPLANT
CONT SPEC 4OZ CLIKSEAL STRL BL (MISCELLANEOUS) IMPLANT
DRAPE LAPAROTOMY 100X72X124 (DRAPES) ×2 IMPLANT
DRAPE MICROSCOPE LEICA (MISCELLANEOUS) ×2 IMPLANT
DRAPE POUCH INSTRU U-SHP 10X18 (DRAPES) ×2 IMPLANT
DRAPE SURG 17X23 STRL (DRAPES) ×2 IMPLANT
DRESSING TELFA 8X3 (GAUZE/BANDAGES/DRESSINGS) ×2 IMPLANT
DURAPREP 26ML APPLICATOR (WOUND CARE) ×2 IMPLANT
ELECT REM PT RETURN 9FT ADLT (ELECTROSURGICAL) ×2
ELECTRODE REM PT RTRN 9FT ADLT (ELECTROSURGICAL) ×1 IMPLANT
GAUZE SPONGE 4X4 16PLY XRAY LF (GAUZE/BANDAGES/DRESSINGS) IMPLANT
GLOVE BIO SURGEON STRL SZ8 (GLOVE) ×1 IMPLANT
GLOVE BIOGEL PI IND STRL 8 (GLOVE) IMPLANT
GLOVE BIOGEL PI INDICATOR 8 (GLOVE) ×1
GLOVE ECLIPSE 7.5 STRL STRAW (GLOVE) ×2 IMPLANT
GLOVE EXAM NITRILE LRG STRL (GLOVE) IMPLANT
GLOVE EXAM NITRILE MD LF STRL (GLOVE) ×1 IMPLANT
GLOVE EXAM NITRILE XL STR (GLOVE) IMPLANT
GLOVE EXAM NITRILE XS STR PU (GLOVE) IMPLANT
GLOVE INDICATOR 8.5 STRL (GLOVE) ×1 IMPLANT
GLOVE OPTIFIT SS 8.5 STRL (GLOVE) ×1 IMPLANT
GOWN BRE IMP SLV AUR LG STRL (GOWN DISPOSABLE) ×2 IMPLANT
GOWN BRE IMP SLV AUR XL STRL (GOWN DISPOSABLE) ×1 IMPLANT
GOWN STRL REIN 2XL LVL4 (GOWN DISPOSABLE) ×1 IMPLANT
KIT BASIN OR (CUSTOM PROCEDURE TRAY) ×2 IMPLANT
KIT ROOM TURNOVER OR (KITS) ×2 IMPLANT
NDL HYPO 18GX1.5 BLUNT FILL (NEEDLE) IMPLANT
NEEDLE HYPO 18GX1.5 BLUNT FILL (NEEDLE) ×2 IMPLANT
NEEDLE HYPO 22GX1.5 SAFETY (NEEDLE) ×4 IMPLANT
NS IRRIG 1000ML POUR BTL (IV SOLUTION) ×2 IMPLANT
PACK LAMINECTOMY NEURO (CUSTOM PROCEDURE TRAY) ×2 IMPLANT
PAD ARMBOARD 7.5X6 YLW CONV (MISCELLANEOUS) ×6 IMPLANT
PATTIES SURGICAL .75X.75 (GAUZE/BANDAGES/DRESSINGS) ×2 IMPLANT
RUBBERBAND STERILE (MISCELLANEOUS) ×4 IMPLANT
SPONGE GAUZE 4X4 12PLY (GAUZE/BANDAGES/DRESSINGS) ×2 IMPLANT
SPONGE LAP 4X18 X RAY DECT (DISPOSABLE) IMPLANT
SPONGE SURGIFOAM ABS GEL SZ50 (HEMOSTASIS) ×2 IMPLANT
STRIP CLOSURE SKIN 1/2X4 (GAUZE/BANDAGES/DRESSINGS) ×2 IMPLANT
SUT PROLENE 6 0 BV (SUTURE) IMPLANT
SUT VIC AB 0 CT1 18XCR BRD8 (SUTURE) ×1 IMPLANT
SUT VIC AB 0 CT1 8-18 (SUTURE) ×2
SUT VIC AB 2-0 CP2 18 (SUTURE) ×2 IMPLANT
SUT VIC AB 3-0 SH 8-18 (SUTURE) ×2 IMPLANT
SYR 20CC LL (SYRINGE) ×1 IMPLANT
SYR 5ML LL (SYRINGE) IMPLANT
SYR CONTROL 10ML LL (SYRINGE) ×1 IMPLANT
TOWEL OR 17X24 6PK STRL BLUE (TOWEL DISPOSABLE) ×2 IMPLANT
TOWEL OR 17X26 10 PK STRL BLUE (TOWEL DISPOSABLE) ×2 IMPLANT
WATER STERILE IRR 1000ML POUR (IV SOLUTION) ×2 IMPLANT

## 2012-05-14 NOTE — Preoperative (Signed)
Beta Blockers   Reason not to administer Beta Blockers:Not Applicable 

## 2012-05-14 NOTE — H&P (Signed)
See H& P.

## 2012-05-14 NOTE — Anesthesia Procedure Notes (Signed)
Procedure Name: Intubation Date/Time: 05/14/2012 12:15 PM Performed by: Ellin Goodie Pre-anesthesia Checklist: Patient identified, Emergency Drugs available, Suction available, Patient being monitored and Timeout performed Patient Re-evaluated:Patient Re-evaluated prior to inductionOxygen Delivery Method: Circle system utilized Preoxygenation: Pre-oxygenation with 100% oxygen Intubation Type: IV induction Ventilation: Mask ventilation without difficulty Laryngoscope Size: Mac and 3 Grade View: Grade II Tube type: Oral Tube size: 7.5 mm Number of attempts: 1 Airway Equipment and Method: Video-laryngoscopy and Stylet Placement Confirmation: ETT inserted through vocal cords under direct vision,  positive ETCO2 and breath sounds checked- equal and bilateral Secured at: 23 cm Tube secured with: Tape Dental Injury: Teeth and Oropharynx as per pre-operative assessment  Difficulty Due To: Difficult Airway- due to limited oral opening, Difficult Airway- due to reduced neck mobility, Difficult Airway- due to anterior larynx and Difficulty was anticipated Future Recommendations: Recommend- induction with short-acting agent, and alternative techniques readily available Comments: Documented difficult airway from 2006 with multiple attempts and fiberoptic intubation.  Emergency equipment available and plan discussed with Dr. Chaney Malling and pt.  Short acting paralytic used and DL x 1 with videolaryngoscope MAC 3 blade with good view of vocal cords.  Carlynn Herald, CRNA

## 2012-05-14 NOTE — Op Note (Signed)
05/14/2012  1:49 PM  PATIENT:  William [name]  79 y.o. male  PRE-OPERATIVE DIAGNOSIS: lumbar stenosis,  Lumbar herniated nucleus pulposus with myelopathy, Lumbar radiculopathy  POST-OPERATIVE DIAGNOSIS  :same  PROCEDURE:  Procedure(s): LUMBAR Decompressive laminectomy, decompressing the L4 and L5 roots(left) - (2 levels), foraminal discectomy , microdisection  SURGEON:  Surgeon(s): Clydene Fake, MD  ASSISTANTS:jenkins  ANESTHESIA:   general  EBL:  Total I/O In: 1000 [I.V.:1000] Out: -   BLOOD ADMINISTERED:none  DRAINS: none   SPECIMEN:  No Specimen  DICTATION: Patient with back and left leg pain numbness weakness. MRI was done showing severe spinal change at the fourth of level slight anterior slips significant facet hypertrophy and the left side and that's and possible synovial cyst off the causing the canal stenosis but also disc herniation going cephalad and into the foramen causing compression the 4 root patient is a good stenosis lateral recess stenosis foraminal stenosis due to problems brought in for decompressive laminectomy and discectomy.  (Operative general anesthesia induced patient placed in prone position Wilson frame all pressure points padded. Patient prepped draped sterile fashion segments inject with 10 cc 1% lidocaine with epinephrine. Needle was placed interspace x-rays obtained showing needles pointing at the 45 interspace incision was then made centered with needle was incision taken the fascia hemostasis obtained with Bovie cauterization and sent in the fascia incised the Bovie and subperiosteal dissection done over the L4 and 5 spinous process out to the facets on the left side. Soaking retractors were placed markers placed interspace another x-ray was obtained confirming or positioning at L4-5. Microscope was brought in for microdissection is a high-speed drill was used to search semi-hemi-laminectomy medial facetectomy is completed with Kerrison  punches we medially range synovial cyst and the dissected this off the dura thin membrane was still left over the dura we could not easily a pillow made sure we did decompression central canal and the L5 root and we extended her oh urinary dissection cephalad again removing the cyst and dissecting up into the foramen at 45 were there was still some R. are consistent with thickened ligament that we removed we then explored the disc space and found the disc herniation going cephalad into the foramen and we carefully removed of those pieces of the disc herniation with a hooks and pituitary rongeurs. And explored the 4 and 5 roots and the major the exited the foramen well the central canal is also well decompressed and no further fragments of disc were found. Again explore the excellent disc space was broad-based bulge there is some ventral thesis that so there and we decided to leave that alone as far as we did not to cut into the disc space and dissected further. We did hemostasis we. About solution again checked nerve root could decompress did decompression the central canal and the 4 and 5 roots and this the left side were at this was removed the field. About solution fascia closed 0 Vicryl interrupted sutures of his tissue closed with 020 3-0 Vicryl interrupted sutures skin closed benzoin Steri-Strips dressing was placed patient placed back in spine position woken from anesthesia.  PLAN OF CARE: Admit for overnight observation  PATIENT DISPOSITION:  PACU - hemodynamically stable.

## 2012-05-14 NOTE — Progress Notes (Signed)
Hx parkinsons. Fine tremors. EKG reviewed by Mosie Epstein pa

## 2012-05-14 NOTE — Plan of Care (Signed)
Problem: Consults Goal: Diagnosis - Spinal Surgery Outcome: Completed/Met Date Met:  05/14/12 Lumbar Laminectomy (Complex)

## 2012-05-14 NOTE — Anesthesia Preprocedure Evaluation (Addendum)
Anesthesia Evaluation  Patient identified by MRN, date of birth, ID band Patient awake    Reviewed: Allergy & Precautions, H&P , NPO status , Patient's Chart, lab work & pertinent test results  History of Anesthesia Complications (+) DIFFICULT AIRWAY  Airway Mallampati: III  Neck ROM: limited    Dental   Pulmonary pneumonia -, resolved, former smoker,          Cardiovascular hypertension, Pt. on medications     Neuro/Psych Parkinsons dz TIA   GI/Hepatic   Endo/Other    Renal/GU      Musculoskeletal   Abdominal   Peds  Hematology   Anesthesia Other Findings   Reproductive/Obstetrics                         Anesthesia Physical Anesthesia Plan  ASA: III  Anesthesia Plan: General   Post-op Pain Management:    Induction: Intravenous  Airway Management Planned: Oral ETT  Additional Equipment:   Intra-op Plan:   Post-operative Plan: Extubation in OR  Informed Consent: I have reviewed the patients History and Physical, chart, labs and discussed the procedure including the risks, benefits and alternatives for the proposed anesthesia with the patient or authorized representative who has indicated his/her understanding and acceptance.     Plan Discussed with: CRNA and Surgeon  Anesthesia Plan Comments:         Anesthesia Quick Evaluation

## 2012-05-14 NOTE — Transfer of Care (Signed)
Immediate Anesthesia Transfer of Care Note  Patient: William [name]  Procedure(s) Performed: Procedure(s) (LRB) with comments: LUMBAR LAMINECTOMY/DECOMPRESSION MICRODISCECTOMY 1 LEVEL (Left) - Left Lumbar four-five Laminectomy/Diskectomy/Far lateral diskectomy  Patient Location: PACU  Anesthesia Type:General  Level of Consciousness: awake, alert  and oriented  Airway & Oxygen Therapy: Patient connected to face mask  Post-op Assessment: Report given to PACU RN  Post vital signs: stable  Complications: No apparent anesthesia complications

## 2012-05-14 NOTE — Interval H&P Note (Signed)
History and Physical Interval Note:  05/14/2012 11:54 AM  William [name]  has presented today for surgery, with the diagnosis of Lumbar hnp with myelopathy, Lumbar radiculopathy  The various methods of treatment have been discussed with the patient and family. After consideration of risks, benefits and other options for treatment, the patient has consented to  Procedure(s) (LRB) with comments: LUMBAR LAMINECTOMY/DECOMPRESSION MICRODISCECTOMY 1 LEVEL (Left) - Left L4-5 Laminectomy/Diskectomy/Far lateral diskectomy as a surgical intervention .  The patient's history has been reviewed, patient examined, no change in status, stable for surgery.  I have reviewed the patient's chart and labs.  Questions were answered to the patient's satisfaction.     Denetta Fei R

## 2012-05-14 NOTE — Anesthesia Postprocedure Evaluation (Signed)
Anesthesia Post Note  Patient: William [name]  Procedure(s) Performed: Procedure(s) (LRB): LUMBAR LAMINECTOMY/DECOMPRESSION MICRODISCECTOMY 1 LEVEL (Left)  Anesthesia type: General  Patient location: PACU  Post pain: Pain level controlled and Adequate analgesia  Post assessment: Post-op Vital signs reviewed, Patient's Cardiovascular Status Stable, Respiratory Function Stable, Patent Airway and Pain level controlled  Last Vitals:  Filed Vitals:   05/14/12 1500  BP: 167/76  Pulse: 55  Temp:   Resp: 11    Post vital signs: Reviewed and stable  Level of consciousness: awake, alert  and oriented  Complications: No apparent anesthesia complications

## 2012-05-15 ENCOUNTER — Encounter (HOSPITAL_COMMUNITY): Payer: Self-pay | Admitting: Neurosurgery

## 2012-05-15 MED ORDER — HYDROCODONE-ACETAMINOPHEN 5-325 MG PO TABS: 1.0000 | ORAL_TABLET | ORAL | Status: DC | PRN

## 2012-05-15 MED FILL — Mupirocin Oint 2%: CUTANEOUS | Qty: 22 | Status: AC

## 2012-05-15 NOTE — Discharge Summary (Signed)
Physician Discharge Summary  Patient ID: William [name] MRN: [id number] DOB/AGE: 77-Jul-1930 77 y.o.  Admit date: 05/14/2012 Discharge date: 05/15/2012  Admission Diagnoses:lumbar stenosis, Lumbar herniated nucleus pulposus with myelopathy, Lumbar radiculopathy   Discharge Diagnoses: lumbar stenosis, Lumbar herniated nucleus pulposus with myelopathy, Lumbar radiculopathy  Active Problems:  * No active hospital problems. *    Discharged Condition: good  Hospital Course: pt admitted on day of surgery - underwent procedure below - pt withno leg pain - ambulating, voiding well   Significant Diagnostic Studies: none  Treatments: surgery: LUMBAR Decompressive laminectomy, decompressing the L4 and L5 roots(left) - (2 levels), foraminal discectomy , microdisection      Discharge Exam: Blood pressure 160/88, pulse 55, temperature 98.7 F (37.1 C), temperature source Oral, resp. rate 20, height 6\' 4"  (1.93 m), weight 78.7 kg (173 lb 8 oz), SpO2 96.00%. Wound:c/d/i  Disposition: home  Discharge Orders    Future Appointments: Provider: Department: Dept Phone: Center:   06/09/2012 9:30 AM Michele Mcalpine, MD [facility] [phone] None       Medication List     As of 05/15/2012  8:48 AM    TAKE these medications         aspirin EC 81 MG tablet   Take 81 mg by mouth daily.      carbidopa-levodopa 25-100 MG per tablet   Commonly known as: SINEMET IR   Take 1 tablet by mouth 3 (three) times daily.      doxazosin 8 MG tablet   Commonly known as: CARDURA   Take 4 mg by mouth at bedtime.      HYDROcodone-acetaminophen 5-325 MG per tablet   Commonly known as: NORCO/VICODIN   Take 1-2 tablets by mouth every 4 (four) hours as needed.      levothyroxine 200 MCG tablet   Commonly known as: SYNTHROID, LEVOTHROID   Take 100-200 mcg by mouth daily. Alternates daily by taking (1 tablet) once daily and then taking ( tablet) the next day, and so on.     losartan-hydrochlorothiazide 100-25 MG per tablet   Commonly known as: HYZAAR   Take 1 tablet by mouth daily.      multivitamin with minerals Tabs   Take 1 tablet by mouth daily.      omega-3 acid ethyl esters 1 G capsule   Commonly known as: LOVAZA   Take 1 g by mouth daily.      simvastatin 20 MG tablet   Commonly known as: ZOCOR   Take 20 mg by mouth every evening.         SignedClydene Fake, MD 05/15/2012, 8:48 AM

## 2012-06-09 ENCOUNTER — Ambulatory Visit (INDEPENDENT_AMBULATORY_CARE_PROVIDER_SITE_OTHER): Payer: Medicare Other | Admitting: Pulmonary Disease

## 2012-06-09 ENCOUNTER — Encounter: Payer: Self-pay | Admitting: Pulmonary Disease

## 2012-06-09 VITALS — BP 138/80 | HR 63 | Temp 96.9°F | Ht 76.0 in | Wt 182.6 lb

## 2012-06-09 DIAGNOSIS — M545 Low back pain, unspecified: Secondary | ICD-10-CM

## 2012-06-09 DIAGNOSIS — G459 Transient cerebral ischemic attack, unspecified: Secondary | ICD-10-CM

## 2012-06-09 DIAGNOSIS — C73 Malignant neoplasm of thyroid gland: Secondary | ICD-10-CM

## 2012-06-09 DIAGNOSIS — N138 Other obstructive and reflux uropathy: Secondary | ICD-10-CM

## 2012-06-09 DIAGNOSIS — E785 Hyperlipidemia, unspecified: Secondary | ICD-10-CM

## 2012-06-09 DIAGNOSIS — D3001 Benign neoplasm of right kidney: Secondary | ICD-10-CM

## 2012-06-09 DIAGNOSIS — N401 Enlarged prostate with lower urinary tract symptoms: Secondary | ICD-10-CM

## 2012-06-09 DIAGNOSIS — I7090 Unspecified atherosclerosis: Secondary | ICD-10-CM

## 2012-06-09 DIAGNOSIS — G20 Parkinson's disease: Secondary | ICD-10-CM

## 2012-06-09 DIAGNOSIS — D126 Benign neoplasm of colon, unspecified: Secondary | ICD-10-CM

## 2012-06-09 DIAGNOSIS — D3000 Benign neoplasm of unspecified kidney: Secondary | ICD-10-CM

## 2012-06-09 DIAGNOSIS — M1990 Unspecified osteoarthritis, unspecified site: Secondary | ICD-10-CM

## 2012-06-09 DIAGNOSIS — I10 Essential (primary) hypertension: Secondary | ICD-10-CM

## 2012-06-09 DIAGNOSIS — G20A1 Parkinson's disease without dyskinesia, without mention of fluctuations: Secondary | ICD-10-CM

## 2012-06-09 NOTE — Progress Notes (Signed)
Subjective:    Patient ID: William [name], male    DOB: Aug 22, 1928, 79 y.o.   MRN: [id number]  HPI 79 y/o WM here for a follow up visit... he has multiple medical problems as noted below... followed for HBP, Carotid vasc dis w/ hx of TIA & prev subdural hematoma, Chol, & prev surg for medullary thyroid carcinoma... his wife passed away from metastatic breast cancer 11/09...  ~  June 06, 2011:  69mo ROV & William [name] states he is doing satis, w/o new complaints or concerns... He had a left lower lid ectropion repair 12/12 by DrZaldivar in [facility], [place].    BP remains controlled on meds; BP= 122/60 today & he remains asymptomatic w/o CP, palpit, SOB, edema, etc...    He remains on ASA81 & denies any cerebral ischemic symptoms; he has f/u appt soon w/ DrLove for f/u Parkinson's dis (on Sinemet)...    Chol looks good on Simva40 (see below)...     Hx medullary thyroid cancer, followed by DrDNewman, TSH today = 2.29 on Synthroid212mcg- 1x4d=TThSS & 1/2 x3d=MWF... We reviewed his problem list, meds, XRays & Labs today...  ~  December 05, 2011:  69mo ROV & William [name] relates lifting a mattress several months ago w/ back pain> eval by Alinda Sierras, DrBrooks w/ MRI showing bulging discs at L4-5 & treated w/ Pred w/o benefit, then ESI and this helped some but he has persistent left leg symptoms (on Hydrocodone & Aleve) and can't play golf yet; he had second opinion DrHirsh for [employer] they are waiting on surg;  Incidental finding on the MRI was a renal lesion ==> referred to DrGrapey & they have consulted DrYamagata, IR for cyst drainage and bx of lesion (sched for 9/13- may need cryoablation if it's a cancer)...    BP remains controlled on Hyzaar & Cardura;  Chol remains at goals on Simva20;  Thyroid unchanged & stable on Synthroid Rx;  DrLove manages his Parkinson's & recently increased his Sinemet...    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  ~  June 09, 2012:  69mo ROV & William [name] is here w/ his new girlfriend, William [name]; he had back surg by DrHirsh 3 wks ago & is much improved he says; he notes a sinking feeling x2 since surg- both episodes occurred after eating  & while he was under a warm vent (he will avoid the vent, and observe for any recurrent episodes);  We reviewed the following medical problems during today's office visit >>     HBP> on Hyzaar100-12.5 & Cardura8; BP=138/80 today & denies CP, palpit, SOB, edema...    ASPVD> on ASA 81mg  daily w/o cerebral ischemic symptoms etc; he is off Plavix ever since his SDH...    Chol> on Simva20, Fish Oil & diet rx; last FLP 8/13 showed TChol 137, TG 73, HDL 53, LDL 70    Hx medullary thyroid cancer> on Synthroid200-taking 1/2 alt w/1 Qod; followed by [facility] for [facility]CS; TFTs remain wnl w/ TSH 8/13 = 0.69     GI-  Divertics, HxPolyps> last colon 2007 by DrSamLeB showed divertics, hems, no polyps & f/u suggested 61yrs but now>82y/o...    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    DJD, LBP> on Vicodin prn; he developed LBP after lifting a mattress 5/13; eval by DrBrooks & DrHirsh w/ MRI reported to show HNP at L4-5 (w/ myelopathy & radiculopathy); DrHirsh did LumbarLam 1/14 w/ decompression L4 & L5 w/ discectomy=> improved...    Neuro- TIA, Hx subdural hemtoma, Tremor/Parkinson's> Still followed by Autumn Patty for Neuro (& he reports consult at [facility] from DrScott- we do not  have notes from them) on Sinemet 25-100 Tid; "I'm doing exercises"    Hx skin cancer> followed by Derm; SCCa removed from right forearm in 2006... We reviewed prob list, meds, xrays and labs> see below for updates >> he had the 2013 Flu vaccine 10/13, up to date on others... LABS 1/14 preop in EPIC> Chems- ok & Cr=1.3-1.5;  CBC- ok          Problem List:  HYPERTENSION (ICD-401.9) - controlled on HYZAAR 100/25 daily, & CARDURA 8mg - taking 1/2 daily... ~  2/13:  BP= 122/60 & he denies CP, palpit, SOB, edema, etc... ~  8/13:  BP= 140/80 & he remains largely asymptomatic... ~  CXR 1/14 showed normal heart size, ectatic & calcif Ao, low lying diaph, clear lungs, NAD... ~  EKG 1/14 showed NSR, rate61,  Poor R prog V1-3, NSSTTWA... ~  2/14: on Hyzaar100-12.5 & Cardura8; BP=138/80 today & denies CP, palpit, SOB, edema.  ATHEROSCLEROTIC VASCULAR DISEASE (ICD-440.9) - on ASA 81mg /d;  Prev Plavix was discontinued after his subdural hematoma in 2006. ~  Eval 2003 by [facility] for TIA showed cerebrovasc dis w/ R>L PCA stenoses...  HYPERLIPIDEMIA (ICD-272.4) - on SIMVASTATIN 40mg -taking 1/2 + FISH OIL 1000mg /d> doing well. ~  FLP 12/07 on Zocor showed TChol 137, HDL 47, LDL 70 ~  FLP 7/09 off med showed TChol 196, TG 95, HDL 49, LDL 128... rec> restart Zocor 40mg . ~  FLP 7/10 showed TChol 135, TG 47, HDL 53, LDL 73... He decr dose to 1/2 tab on his own... 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 he is s/p thyroidectomy for medullary carcinoma of the thyroid 6/02 by [facility]... ~  labs 7/09 showed TSH= 3.47 ~  labs 7/10 showed TSH= 2.95 ~  labs 8/11 showed TSH= 1.20 ~  Labs 8/12 showed TSH= 0.23 & pt rec to decr the Synthroid200 to only 1/2 on MWF.Marland Kitchen. ~  Labs 2/13  showed TSH= 2.29 on Synthroid200- taking 1x4d=TThSS and 1/2 x3d=MWF.Marland Kitchen. ~  Labs 8/13 showed TSH= 0.69... On same dose.  COLONIC POLYPS (ICD-211.3) - last colonoscopy 1/07 by DrSam showed divertics & hems only... f/u planned 41yrs.  BENIGN PROSTATIC HYPERTROPHY, WITH OBSTRUCTION (ICD-600.01) - he sees DrDavis for Urology once a year... he has a brother who had prostate cancer...  ~  PSA followed by ZOXWRUEA yrearly... 7/10 pt reports PSA>5 (was 4.17 here) & Rx w/ Cipro... ~  he had f/u Urology  eval DrGrapey 5/11- pt indicates that his PSA was OK... 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 ~  2/14:  followed by DrGrapey & DrYamagata re-imaged the lesion via CT 1/14 w/ growth evident & he has rec Cryoablation=> pending due to need for LLam first...  TRANSIENT ISCHEMIC ATTACK (ICD-435.9) - hx of recurrent right brain TIA's in 2003... initially on ASA + Plavix and the Plavix was stopped after his subdural in 2006...  Hx of SUBDURAL HEMATOMA (ICD-432.1) - hosp 7/06 w/ left subdural hematoma req craniotomy by DrHirsh for evacuation... no known trauma- he was on ASA/ Plavix and developed a headache... he also take DILANTIN 100mg  Tid now per [facility] (for a partial seizure characterized by aphasia)... ~  7/10: pt tells me he has decreased his Dilantin to  Bid and will f/u w/ DrLove. ~  8/11:  we don't have any recent notes from [facility], but pt is off the Dilantin rx.  TREMOR (ICD-781.0) - he notes mild tremor right hand & arm... eval from Neurology- DrLove- indicated prob early [name] and after a period of observation they started Mirapex (stopped due to hypotension), but changed to [name] 2/12> dosed per DrLove... ~  8/11: pt tells me that he has started accupuncture treatments in [place]==> no benefit. ~  2/12:  pt indicates that DrLove stopped the Mirapex in favor of SINEMET 25/100- taking 1/2 Tid. ~  8/12:  Pt tells me he has seen DrScott, Neurology at [facility] (?referred by [facility], ?second opinion), we don't have records, pt reports no change in meds (he considered entering a drug study). ~  He continues to f/u w/ DrLove every 4-72months... ~  8/13:  He indicates that Drlove recently increased the SINEMET 25/100 to 1 tab Tid...  DEGENERATIVE JOINT DISEASE (ICD-715.90) - he notes some knee discomfort & he takes Aleve & HYDROCODONE as needed... LBP >> injured back lifting mattress 5/13; eval by DrBrooks & DrHirsh w/ abn MRI (done by Ortho at their office) & disc herniation L4-5 by report; Pred w/o help, ESI helped some, using pain Rx & holding off on surg per DrHirsh... ~  1/14: on Vicodin prn; eval by DrBrooks & DrHirsh w/ MRI reported to show HNP at L4-5 (w/ myelopathy & radiculopathy); DrHirsh did LumbarLam 1/14 w/ decompression L4 & L5 w/ discectomy=> improved...  Hx of CARCINOMA, SKIN, SQUAMOUS CELL (ICD-173.9) - s/p removal of skin cancer from his right forearm 11/06 by DrLupton...  Health Maintenance:  he had PNEUMOVAX vaccine in 1998 & 1/11... gets yearly Flu vaccine every fall... TDAP given 8/11...   Past Surgical History  Procedure Laterality Date  . Cataract sugery    . Cholecystectomy    . Right inguinal hernia repair    . Thyroidectomy for medullary carcinoma    . Eye  surgery    . Knee arthroscopy    . Appendectomy    .  Tonsillectomy    . Lumbar laminectomy/decompression microdiscectomy  05/14/2012    Procedure: LUMBAR LAMINECTOMY/DECOMPRESSION MICRODISCECTOMY 1 LEVEL;  Surgeon: Clydene Fake, MD;  Location: [facility];  Service: Neurosurgery;  Laterality: Left;  Left Lumbar four-five Laminectomy/Diskectomy/Far lateral diskectomy    Outpatient Encounter Prescriptions as of 06/09/2012  Medication Sig Dispense Refill  . aspirin EC 81 MG tablet Take 81 mg by mouth daily.      . carbidopa-levodopa (SINEMET) 25-100 MG per tablet Take 1 tablet by mouth 3 (three) times daily.       Marland Kitchen doxazosin (CARDURA) 8 MG tablet Take 4 mg by mouth at bedtime.      Marland Kitchen levothyroxine (SYNTHROID, LEVOTHROID) 200 MCG tablet Take 100-200 mcg by mouth daily. Alternates daily by taking (1 tablet) once daily and then taking ( tablet) the next day, and so on.      . losartan-hydrochlorothiazide (HYZAAR) 100-25 MG per tablet Take 1 tablet by mouth daily.      . Multiple Vitamin (MULTIVITAMIN WITH MINERALS) TABS Take 1 tablet by mouth daily.      Marland Kitchen omega-3 acid ethyl esters (LOVAZA) 1 G capsule Take 1 g by mouth daily.      . simvastatin (ZOCOR) 20 MG tablet Take 20 mg by mouth every evening.      Marland Kitchen HYDROcodone-acetaminophen (NORCO/VICODIN) 5-325 MG per tablet Take 1-2 tablets by mouth every 4 (four) hours as needed.  41 tablet  0   No facility-administered encounter medications on file as of 06/09/2012.    No Known Allergies   Current Medications, Allergies, Past Medical History, Past Surgical History, Family History, and Social History were reviewed in [employer] record.    Review of Systems       See HPI - all other systems neg except as noted...       The patient complains of dyspnea on exertion, right hand tremor, and difficulty walking.  The patient denies anorexia, fever, weight loss, weight gain, vision loss, decreased hearing, hoarseness, chest pain, syncope, peripheral edema, prolonged  cough, headaches, hemoptysis, abdominal pain, melena, hematochezia, severe indigestion/heartburn, hematuria, incontinence, muscle weakness, suspicious skin lesions, transient blindness, depression, unusual weight change, abnormal bleeding, enlarged lymph nodes, and angioedema.     Objective:   Physical Exam     WD, WN, 79 y/o WM in NAD... GENERAL:  Alert & oriented; pleasant & cooperative... HEENT:  [place]/AT, EOM-wnl, PERRLA, EACs-clear, TMs-wnl, NOSE-clear, THROAT-clear & wnl. NECK:  Supple w/ fairROM; no JVD; normal carotid impulses w/o bruits; thyroid area scar, no nodules palp; no lymphadenopathy. CHEST:  Clear to P & A; without wheezes/ rales/ or rhonchi heard... HEART:  Regular Rhythm; without murmurs/ rubs/ or gallops detected... ABDOMEN:  Soft & nontender; normal bowel sounds; no organomegaly or masses palpated... BACK:  Scar of recent LLam surg... EXT: without deformities, mild arthritic changes; no varicose veins/ +venous insuffic/ no edema. NEURO:  CN's intact;  mod tremor right hand & arm at rest, ?early mask facies, still moves well. DERM:  No lesions noted; no rash etc...  RADIOLOGY DATA:  Reviewed in the EPIC EMR & discussed w/ the patient...  LABORATORY DATA:  Reviewed in the EPIC EMR & discussed w/ the patient...   Assessment & Plan:    HBP>  Controlled on meds, continue same...  PVD>  On ASA w/o cerebral ischemic symptoms, continue same...  HYPERLIPID>  On Simva20 + Fish Oil & FLPs have been at goal, continue same...  Hx Medullary Thyroid Carcinoma>  S/p surg by DrNewman2002, on Synthroid - alternating 1-1/2 now & TSH is ok on this dose...  GI> remote hx polyp, divertics, hems>  Last colon 2007 by by DrSam & suggested f/u 46yrs...  GU>  Followed by DrGrapey now, RENAL LESION found incidentally on MRI Lumbar spine & w/u revealed oncocytoma w/ cryoablation planned soon per DrYamagata...  NEURO> Hx TIA, Hx SDH, Tremor/ Parkinson's>  Followed by Autumn Patty & now  DrScott by pt's hx...  DJD, LBP>  He had decompressive LLam 1/14 by DrHirsh & is recovering nicely...  Other medical issues as noted...   Patient's Medications  New Prescriptions   No medications on file  Previous Medications   ASPIRIN EC 81 MG TABLET    Take 81 mg by mouth daily.   CARBIDOPA-LEVODOPA (SINEMET) 25-100 MG PER TABLET    Take 1 tablet by mouth 3 (three) times daily.    DOXAZOSIN (CARDURA) 8 MG TABLET    Take 4 mg by mouth at bedtime.   LEVOTHYROXINE (SYNTHROID, LEVOTHROID) 200 MCG TABLET    Take 100-200 mcg by mouth daily. Alternates daily by taking (1 tablet) once daily and then taking ( tablet) the next day, and so on.   LOSARTAN-HYDROCHLOROTHIAZIDE (HYZAAR) 100-25 MG PER TABLET    Take 1 tablet by mouth daily before breakfast.    MULTIPLE VITAMIN (MULTIVITAMIN WITH MINERALS) TABS    Take 1 tablet by mouth daily.   SIMVASTATIN (ZOCOR) 40 MG TABLET    Take 20 mg by mouth every evening. Takes 1/2 tablet  Modified Medications   No medications on file  Discontinued Medications   HYDROCODONE-ACETAMINOPHEN (NORCO/VICODIN) 5-325 MG PER TABLET    Take 1-2 tablets by mouth every 4 (four) hours as needed.   OMEGA-3 ACID ETHYL ESTERS (LOVAZA) 1 G CAPSULE    Take 1 g by mouth daily.   SIMVASTATIN (ZOCOR) 20 MG TABLET    Take 20 mg by mouth every evening.

## 2012-06-09 NOTE — Patient Instructions (Addendum)
Today we updated your med list in our EPIC system...    Continue your current medications the same...  We reviewed your recent hosp data, XRays and blood work...  Call for any questions or if we can be of service in any way...  Let's plan a similar check up in 6 months.Marland KitchenMarland Kitchen

## 2012-06-26 ENCOUNTER — Encounter (HOSPITAL_COMMUNITY): Payer: Self-pay | Admitting: Pharmacy Technician

## 2012-06-29 NOTE — Progress Notes (Signed)
Need orders in EPIC please - pt coming for preop 07/02/12 - thank you

## 2012-07-01 ENCOUNTER — Other Ambulatory Visit: Payer: Self-pay | Admitting: Radiology

## 2012-07-01 NOTE — Patient Instructions (Signed)
William [name]  07/01/2012   Your procedure is scheduled on:  07/10/12   Report toRadiology at            AM.  Call this number if you have problems the morning of surgery: [phone]   Remember:   Do not eat food or drink liquids after midnight.   Take these medicines the morning of surgery with A SIP OF WATER:    Do not wear jewelry,  Do not wear lotions, powders, or perfumes.   . Men may shave face and neck.  Do not bring valuables to the hospital.  Contacts, dentures or bridgework may not be worn into surgery.  Leave suitcase in the car. After surgery it may be brought to your room.  For patients admitted to the hospital, checkout time is 11:00 AM the day of  discharge.     SEE CHG INSTRUCTION SHEET    Please read over the following fact sheets that you were given: MRSA Information, coughing and deep breathing exercises, leg exercises               Failure to comply with these instructions may result in cancellation of your surgery.                Patient Signature ____________________________              Nurse Signature _____________________________

## 2012-07-02 ENCOUNTER — Encounter (HOSPITAL_COMMUNITY): Payer: Self-pay

## 2012-07-02 ENCOUNTER — Encounter (HOSPITAL_COMMUNITY)
Admission: RE | Admit: 2012-07-02 | Discharge: 2012-07-02 | Disposition: A | Discharge status: Home / Self Care | Payer: Medicare Other | Source: Ambulatory Visit | Attending: Interventional Radiology | Admitting: Interventional Radiology

## 2012-07-02 HISTORY — DX: Failed or difficult intubation, initial encounter: T88.4XXA

## 2012-07-02 HISTORY — DX: Hypothyroidism, unspecified: E03.9

## 2012-07-02 LAB — BASIC METABOLIC PANEL
BUN: 18 mg/dL (ref 6–23)
CO2: 34 mEq/L — ABNORMAL HIGH (ref 19–32)
Calcium: 8.6 mg/dL (ref 8.4–10.5)
Creatinine, Ser: 1.37 mg/dL — ABNORMAL HIGH (ref 0.50–1.35)
GFR calc non Af Amer: 46 mL/min — ABNORMAL LOW (ref >90)
Glucose, Bld: 95 mg/dL (ref 70–99)
Sodium: 141 mEq/L (ref 135–145)

## 2012-07-02 LAB — SURGICAL PCR SCREEN: Staphylococcus aureus: NEGATIVE

## 2012-07-02 LAB — CBC
MCH: 30.2 pg (ref 26.0–34.0)
MCHC: 32.7 g/dL (ref 30.0–36.0)
MCV: 92.5 fL (ref 78.0–100.0)
Platelets: 146 10*3/uL — ABNORMAL LOW (ref 150–400)
RDW: 13.5 % (ref 11.5–15.5)

## 2012-07-02 LAB — PROTIME-INR: Prothrombin Time: 14.4 seconds (ref 11.6–15.2)

## 2012-07-02 LAB — ABO/RH: ABO/RH(D): O POS

## 2012-07-10 ENCOUNTER — Encounter (HOSPITAL_COMMUNITY): Payer: Self-pay | Admitting: *Deleted

## 2012-07-10 ENCOUNTER — Observation Stay (HOSPITAL_COMMUNITY)
Admission: RE | Admit: 2012-07-10 | Discharge: 2012-07-11 | Disposition: A | Discharge status: Home / Self Care | Payer: Medicare Other | Source: Ambulatory Visit | Attending: Interventional Radiology | Admitting: Interventional Radiology

## 2012-07-10 ENCOUNTER — Encounter (HOSPITAL_COMMUNITY): Payer: Self-pay | Admitting: Anesthesiology

## 2012-07-10 ENCOUNTER — Encounter (HOSPITAL_COMMUNITY)
Admission: RE | Disposition: A | Discharge status: Home / Self Care | Payer: Self-pay | Source: Ambulatory Visit | Attending: Interventional Radiology

## 2012-07-10 ENCOUNTER — Other Ambulatory Visit: Payer: Self-pay | Admitting: Radiology

## 2012-07-10 ENCOUNTER — Encounter (HOSPITAL_COMMUNITY): Payer: Self-pay

## 2012-07-10 ENCOUNTER — Ambulatory Visit (HOSPITAL_COMMUNITY)
Admission: RE | Admit: 2012-07-10 | Discharge: 2012-07-10 | Disposition: A | Discharge status: Home / Self Care | Payer: Medicare Other | Source: Ambulatory Visit | Attending: Interventional Radiology | Admitting: Interventional Radiology

## 2012-07-10 ENCOUNTER — Ambulatory Visit (HOSPITAL_COMMUNITY): Payer: Medicare Other | Admitting: Anesthesiology

## 2012-07-10 DIAGNOSIS — N138 Other obstructive and reflux uropathy: Secondary | ICD-10-CM

## 2012-07-10 DIAGNOSIS — C4490 Unspecified malignant neoplasm of skin, unspecified: Secondary | ICD-10-CM

## 2012-07-10 DIAGNOSIS — I10 Essential (primary) hypertension: Secondary | ICD-10-CM

## 2012-07-10 DIAGNOSIS — N2889 Other specified disorders of kidney and ureter: Secondary | ICD-10-CM

## 2012-07-10 DIAGNOSIS — I6200 Nontraumatic subdural hemorrhage, unspecified: Secondary | ICD-10-CM

## 2012-07-10 DIAGNOSIS — I7090 Unspecified atherosclerosis: Secondary | ICD-10-CM

## 2012-07-10 DIAGNOSIS — M545 Low back pain, unspecified: Secondary | ICD-10-CM

## 2012-07-10 DIAGNOSIS — G459 Transient cerebral ischemic attack, unspecified: Secondary | ICD-10-CM

## 2012-07-10 DIAGNOSIS — M1990 Unspecified osteoarthritis, unspecified site: Secondary | ICD-10-CM

## 2012-07-10 DIAGNOSIS — E039 Hypothyroidism, unspecified: Secondary | ICD-10-CM | POA: Insufficient documentation

## 2012-07-10 DIAGNOSIS — N401 Enlarged prostate with lower urinary tract symptoms: Secondary | ICD-10-CM

## 2012-07-10 DIAGNOSIS — D126 Benign neoplasm of colon, unspecified: Secondary | ICD-10-CM

## 2012-07-10 DIAGNOSIS — C73 Malignant neoplasm of thyroid gland: Secondary | ICD-10-CM

## 2012-07-10 DIAGNOSIS — D3000 Benign neoplasm of unspecified kidney: Principal | ICD-10-CM | POA: Insufficient documentation

## 2012-07-10 DIAGNOSIS — G20 Parkinson's disease: Secondary | ICD-10-CM

## 2012-07-10 DIAGNOSIS — D3001 Benign neoplasm of right kidney: Secondary | ICD-10-CM

## 2012-07-10 DIAGNOSIS — E785 Hyperlipidemia, unspecified: Secondary | ICD-10-CM

## 2012-07-10 DIAGNOSIS — G20A1 Parkinson's disease without dyskinesia, without mention of fluctuations: Secondary | ICD-10-CM

## 2012-07-10 HISTORY — PX: CRYOABLATION: SHX1415

## 2012-07-10 SURGERY — RADIO FREQUENCY ABLATION
Anesthesia: General | Laterality: Right | Wound class: Clean

## 2012-07-10 MED ORDER — MIDAZOLAM HCL 5 MG/5ML IJ SOLN
INTRAMUSCULAR | Status: DC | PRN
  Administered 2012-07-10: 1 mg via INTRAVENOUS

## 2012-07-10 MED ORDER — LIDOCAINE HCL (CARDIAC) 20 MG/ML IV SOLN
INTRAVENOUS | Status: DC | PRN
  Administered 2012-07-10: 100 mg via INTRAVENOUS

## 2012-07-10 MED ORDER — ASPIRIN EC 81 MG PO TBEC
81.0000 mg | DELAYED_RELEASE_TABLET | Freq: Every day | ORAL | Status: DC
  Administered 2012-07-10 – 2012-07-11 (×2): 81 mg via ORAL
  Filled 2012-07-10 (×2): qty 1

## 2012-07-10 MED ORDER — LEVOTHYROXINE SODIUM 100 MCG PO TABS: 100.0000 ug | ORAL_TABLET | Freq: Every day | ORAL | Status: DC

## 2012-07-10 MED ORDER — LEVOTHYROXINE SODIUM 100 MCG PO TABS
100.0000 ug | ORAL_TABLET | ORAL | Status: DC
  Filled 2012-07-10: qty 1

## 2012-07-10 MED ORDER — GLYCOPYRROLATE 0.2 MG/ML IJ SOLN
INTRAMUSCULAR | Status: DC | PRN
  Administered 2012-07-10: .4 mg via INTRAVENOUS

## 2012-07-10 MED ORDER — HYDROCHLOROTHIAZIDE 25 MG PO TABS
25.0000 mg | ORAL_TABLET | Freq: Every day | ORAL | Status: DC
  Administered 2012-07-11: 25 mg via ORAL
  Filled 2012-07-10: qty 1

## 2012-07-10 MED ORDER — CISATRACURIUM BESYLATE (PF) 10 MG/5ML IV SOLN
INTRAVENOUS | Status: DC | PRN
  Administered 2012-07-10: 6 mg via INTRAVENOUS
  Administered 2012-07-10 (×2): 2 mg via INTRAVENOUS
  Administered 2012-07-10: 4 mg via INTRAVENOUS

## 2012-07-10 MED ORDER — PROMETHAZINE HCL 25 MG/ML IJ SOLN: 6.2500 mg | INTRAMUSCULAR | Status: DC | PRN

## 2012-07-10 MED ORDER — PHENYLEPHRINE HCL 10 MG/ML IJ SOLN
INTRAMUSCULAR | Status: DC | PRN
  Administered 2012-07-10: 80 ug via INTRAVENOUS
  Administered 2012-07-10: 40 ug via INTRAVENOUS
  Administered 2012-07-10 (×2): 80 ug via INTRAVENOUS
  Administered 2012-07-10: 40 ug via INTRAVENOUS
  Administered 2012-07-10 (×5): 80 ug via INTRAVENOUS

## 2012-07-10 MED ORDER — LEVOTHYROXINE SODIUM 200 MCG PO TABS
200.0000 ug | ORAL_TABLET | ORAL | Status: DC
  Administered 2012-07-11: 200 ug via ORAL
  Filled 2012-07-10: qty 1

## 2012-07-10 MED ORDER — ONDANSETRON HCL 4 MG/2ML IJ SOLN: 4.0000 mg | Freq: Four times a day (QID) | INTRAMUSCULAR | Status: DC | PRN

## 2012-07-10 MED ORDER — SODIUM CHLORIDE 0.9 % IV SOLN
INTRAVENOUS | Status: DC
  Administered 2012-07-10: 17:00:00 via INTRAVENOUS

## 2012-07-10 MED ORDER — FENTANYL CITRATE 0.05 MG/ML IJ SOLN: 25.0000 ug | INTRAMUSCULAR | Status: DC | PRN

## 2012-07-10 MED ORDER — LOSARTAN POTASSIUM-HCTZ 100-25 MG PO TABS: 1.0000 | ORAL_TABLET | Freq: Every day | ORAL | Status: DC

## 2012-07-10 MED ORDER — DOXAZOSIN MESYLATE 4 MG PO TABS
4.0000 mg | ORAL_TABLET | Freq: Every day | ORAL | Status: DC
  Administered 2012-07-10: 4 mg via ORAL
  Filled 2012-07-10 (×2): qty 1

## 2012-07-10 MED ORDER — FENTANYL CITRATE 0.05 MG/ML IJ SOLN
INTRAMUSCULAR | Status: DC | PRN
  Administered 2012-07-10: 50 ug via INTRAVENOUS

## 2012-07-10 MED ORDER — ONDANSETRON HCL 4 MG/2ML IJ SOLN
INTRAMUSCULAR | Status: DC | PRN
  Administered 2012-07-10: 4 mg via INTRAVENOUS

## 2012-07-10 MED ORDER — LACTATED RINGERS IV SOLN
INTRAVENOUS | Status: DC
  Administered 2012-07-10: 09:00:00 via INTRAVENOUS

## 2012-07-10 MED ORDER — EPHEDRINE SULFATE 50 MG/ML IJ SOLN
INTRAMUSCULAR | Status: DC | PRN
  Administered 2012-07-10: 10 mg via INTRAVENOUS
  Administered 2012-07-10 (×4): 5 mg via INTRAVENOUS

## 2012-07-10 MED ORDER — SIMVASTATIN 20 MG PO TABS
20.0000 mg | ORAL_TABLET | Freq: Every day | ORAL | Status: DC
  Administered 2012-07-10: 20 mg via ORAL
  Filled 2012-07-10 (×2): qty 1

## 2012-07-10 MED ORDER — LACTATED RINGERS IV SOLN: INTRAVENOUS | Status: DC

## 2012-07-10 MED ORDER — SENNOSIDES-DOCUSATE SODIUM 8.6-50 MG PO TABS
1.0000 | ORAL_TABLET | Freq: Every day | ORAL | Status: DC | PRN
  Filled 2012-07-10: qty 1

## 2012-07-10 MED ORDER — CARBIDOPA-LEVODOPA 25-100 MG PO TABS
1.0000 | ORAL_TABLET | Freq: Three times a day (TID) | ORAL | Status: DC
  Administered 2012-07-10 – 2012-07-11 (×3): 1 via ORAL
  Filled 2012-07-10 (×5): qty 1

## 2012-07-10 MED ORDER — SODIUM CHLORIDE 0.9 % IV SOLN
INTRAVENOUS | Status: DC | PRN
  Administered 2012-07-10: 13:00:00 via INTRAVENOUS

## 2012-07-10 MED ORDER — PROPOFOL 10 MG/ML IV EMUL
INTRAVENOUS | Status: DC | PRN
  Administered 2012-07-10: 150 mg via INTRAVENOUS

## 2012-07-10 MED ORDER — CEFAZOLIN SODIUM-DEXTROSE 2-3 GM-% IV SOLR
2.0000 g | INTRAVENOUS | Status: AC
  Administered 2012-07-10: 2 g via INTRAVENOUS
  Filled 2012-07-10: qty 50

## 2012-07-10 MED ORDER — NEOSTIGMINE METHYLSULFATE 1 MG/ML IJ SOLN
INTRAMUSCULAR | Status: DC | PRN
  Administered 2012-07-10: 2 mg via INTRAVENOUS

## 2012-07-10 MED ORDER — DOCUSATE SODIUM 100 MG PO CAPS
100.0000 mg | ORAL_CAPSULE | Freq: Two times a day (BID) | ORAL | Status: DC
  Administered 2012-07-11: 100 mg via ORAL
  Filled 2012-07-10 (×4): qty 1

## 2012-07-10 MED ORDER — SUCCINYLCHOLINE CHLORIDE 20 MG/ML IJ SOLN
INTRAMUSCULAR | Status: DC | PRN
  Administered 2012-07-10: 100 mg via INTRAVENOUS

## 2012-07-10 MED ORDER — HYDROCODONE-ACETAMINOPHEN 5-325 MG PO TABS: 1.0000 | ORAL_TABLET | ORAL | Status: DC | PRN

## 2012-07-10 MED ORDER — LOSARTAN POTASSIUM 50 MG PO TABS
100.0000 mg | ORAL_TABLET | Freq: Every day | ORAL | Status: DC
  Administered 2012-07-11: 100 mg via ORAL
  Filled 2012-07-10: qty 2

## 2012-07-10 MED ORDER — MEPERIDINE HCL 50 MG/ML IJ SOLN: 6.2500 mg | INTRAMUSCULAR | Status: DC | PRN

## 2012-07-10 NOTE — Procedures (Signed)
Procedure:  CT guided percutaneous cryoablation of right renal oncocytic neoplasm Anesthesia:  General Findings:  Right renal mass treated with cryoablation with 4 separate Galil Ice Rod Plus probes.  Initial 8 min cycle of ablation followed by thaw and second ablation cycle. Plan:  PACU recovery followed by overnight observation

## 2012-07-10 NOTE — H&P (Signed)
Chief Complaint: (R)renal mass Referring Physician:Grapey HPI: William [name] is an 79 y.o. male with known right renal mass. He has had biopsy in the past revealing oncocytic neoplasm. He has been seen by Dr. Fredia Sorrow in consult and is now scheduled for cryoablation procedure of this renal mass. See PACS IR Rad Eval for details of consult. He underwent recent back surgery about 6 weeks ago which was successful and he reports feeling very well. PMHx and meds reviewed.  Past Medical History:  Past Medical History  Diagnosis Date  . Atherosclerotic vascular disease   . Hyperlipidemia   . Hx of colonic polyps   . Benign prostatic hypertrophy with urinary obstruction   . TIA (transient ischemic attack)   . Subdural hematoma   . Tremor   . DJD (degenerative joint disease)   . Gout   . Malignant neoplasm of thyroid gland   . Primary skin squamous cell carcinoma   . Complication of anesthesia     "difficulty intubation, had to use fiberoptic 2006"  . Hypertension     sees Dr. Alroy Dust  . Thyroid disease   . Pneumonia     hx of in 1952  . Kidney tumor   . Parkinson disease   . Difficult intubation   . Hypothyroidism     Past Surgical History:  Past Surgical History  Procedure Laterality Date  . Cataract sugery    . Cholecystectomy    . Right inguinal hernia repair    . Thyroidectomy for medullary carcinoma    . Eye surgery    . Knee arthroscopy    . Appendectomy    . Tonsillectomy    . Lumbar laminectomy/decompression microdiscectomy  05/14/2012    Procedure: LUMBAR LAMINECTOMY/DECOMPRESSION MICRODISCECTOMY 1 LEVEL;  Surgeon: Clydene Fake, MD;  Location: [facility];  Service: Neurosurgery;  Laterality: Left;  Left Lumbar four-five Laminectomy/Diskectomy/Far lateral diskectomy    Family History: History reviewed. No pertinent family history.  Social History:  reports that he quit smoking about 47 years ago. He has never used smokeless tobacco. He reports that  drinks  alcohol. He reports that he does not use illicit drugs.  Allergies: No Known Allergies  Medications: aspirin EC 81 MG tablet (Taking) Sig - Route: Take 81 mg by mouth daily. - Oral Class: Historical Med carbidopa-levodopa (SINEMET) 25-100 MG per tablet (Taking) Sig - Route: Take 1 tablet by mouth 3 (three) times daily. - Oral Class: Historical Med Number of times this order has been changed since signing: 4 [address] doxazosin (CARDURA) 8 MG tablet (Taking) Sig - Route: Take 4 mg by mouth at bedtime. - Oral Class: Historical Med levothyroxine (SYNTHROID, LEVOTHROID) 200 MCG tablet (Taking) Sig - Route: Take 100-200 mcg by mouth daily. Alternates daily by taking (1 tablet) once daily and then taking ( tablet) the next day, and so on. - Oral Class: Historical Med Number of times this order has been changed since signing: 1 [address] losartan-hydrochlorothiazide (HYZAAR) 100-25 MG per tablet (Taking) Sig - Route: Take 1 tablet by mouth daily before breakfast. - Oral Class: Historical Med Number of times this order has been changed since signing: 2 Order [address] Multiple Vitamin (MULTIVITAMIN WITH MINERALS) TABS (Taking)   Please HPI for pertinent positives, otherwise complete 10 system ROS negative.  Physical Exam: Temp: 97.7, HR: 58, RR: 20, BP: 155/89, O2: 99%   General Appearance:  Alert, cooperative, no distress, appears stated age  Head:  Normocephalic,  without obvious abnormality, atraumatic  ENT: Unremarkable  Neck: Supple, symmetrical, trachea midline  Lungs:   Clear to auscultation bilaterally, no w/r/r, respirations unlabored without use of accessory muscles.  Heart:  Regular rate and rhythm, S1, S2 normal, no murmur, rub or gallop.   Abdomen:   Soft, non-tender, non distended. Bowel sounds active all four quadrants,  no masses, no organomegaly.  Neurologic: Normal affect, mild resting tremor c/w Parkinson's   Labs: CBC    Component Value Date/Time    WBC 3.8* 07/02/2012 1430   RBC 4.80 07/02/2012 1430   HGB 14.5 07/02/2012 1430   HCT 44.4 07/02/2012 1430   PLT 146* 07/02/2012 1430   MCV 92.5 07/02/2012 1430   MCH 30.2 07/02/2012 1430   MCHC 32.7 07/02/2012 1430   RDW 13.5 07/02/2012 1430   . BMET    Component Value Date/Time   NA 141 07/02/2012 1430   K 3.7 07/02/2012 1430   CL 100 07/02/2012 1430   CO2 34* 07/02/2012 1430   GLUCOSE 95 07/02/2012 1430   BUN 18 07/02/2012 1430   CREATININE 1.37* 07/02/2012 1430   CALCIUM 8.6 07/02/2012 1430   GFRNONAA 46* 07/02/2012 1430   GFRAA 54* 07/02/2012 1430    Prothrombin Time/INR 14.4/1.14   Assessment/Plan Rt renal mass, oncocytic neoplasm For CT guided cyroablation today. Discussed procedure, risks, complications, plan for overnight observation. Labs reviewed, all ok. Consent signed in chart  Brayton El PA-C 07/10/2012, 10:26 AM

## 2012-07-10 NOTE — Anesthesia Preprocedure Evaluation (Addendum)
Anesthesia Evaluation  Patient identified by MRN, date of birth, ID band Patient awake    Reviewed: Allergy & Precautions, H&P , NPO status , Patient's Chart, lab work & pertinent test results  History of Anesthesia Complications (+) DIFFICULT AIRWAY  Airway Mallampati: III TM Distance: >3 FB Neck ROM: Full    Dental no notable dental hx. (+) Teeth Intact   Pulmonary former smoker,  breath sounds clear to auscultation  Pulmonary exam normal       Cardiovascular hypertension, Pt. on medications Rhythm:Regular Rate:Normal     Neuro/Psych Parkinson's dz TIAnegative psych ROS   GI/Hepatic negative GI ROS, Neg liver ROS,   Endo/Other  negative endocrine ROSHypothyroidism   Renal/GU negative Renal ROS  negative genitourinary   Musculoskeletal negative musculoskeletal ROS (+)   Abdominal   Peds negative pediatric ROS (+)  Hematology negative hematology ROS (+)   Anesthesia Other Findings   Reproductive/Obstetrics negative OB ROS                         Anesthesia Physical Anesthesia Plan  ASA: III  Anesthesia Plan: General   Post-op Pain Management:    Induction: Intravenous  Airway Management Planned: Oral ETT and Video Laryngoscope Planned  Additional Equipment:   Intra-op Plan:   Post-operative Plan: Extubation in OR  Informed Consent: I have reviewed the patients History and Physical, chart, labs and discussed the procedure including the risks, benefits and alternatives for the proposed anesthesia with the patient or authorized representative who has indicated his/her understanding and acceptance.   Dental advisory given  Plan Discussed with: CRNA  Anesthesia Plan Comments: (From surgery 1/14 "Recommend- induction with short-acting agent, and alternative techniques readily available Comments: Documented difficult airway from 2006 with multiple attempts and fiberoptic  intubation.  Emergency equipment available and plan discussed with Dr. Chaney Malling and pt.  Short acting paralytic used and DL x 1 with videolaryngoscope MAC 3 blade with good view of vocal cords.  Carlynn Herald, CRNA"  Will use videolaryngoscope today and in future. )       Anesthesia Quick Evaluation

## 2012-07-10 NOTE — Anesthesia Postprocedure Evaluation (Signed)
  Anesthesia Post-op Note  Patient: William [name]  Procedure(s) Performed: Procedure(s) (LRB): RIGHT RENAL CRYO ABLATION (Right)  Patient Location: PACU  Anesthesia Type: General  Level of Consciousness: awake and alert   Airway and Oxygen Therapy: Patient Spontanous Breathing  Post-op Pain: mild  Post-op Assessment: Post-op Vital signs reviewed, Patient's Cardiovascular Status Stable, Respiratory Function Stable, Patent Airway and No signs of Nausea or vomiting  Last Vitals:  Filed Vitals:   07/10/12 1530  BP: 134/82  Pulse: 67  Temp:   Resp: 14    Post-op Vital Signs: stable   Complications: No apparent anesthesia complications

## 2012-07-10 NOTE — Progress Notes (Signed)
Dr. Fredia Sorrow in to see patient on floor- made aware of small amount of light -colored drainage on right flank dressing; aware of urine being red- colored with no clots

## 2012-07-10 NOTE — Transfer of Care (Signed)
Immediate Anesthesia Transfer of Care Note  Patient: William [name]  Procedure(s) Performed: Procedure(s): RIGHT RENAL CRYO ABLATION (Right)  Patient Location: PACU  Anesthesia Type:General  Level of Consciousness: awake, alert , oriented, patient cooperative and responds to stimulation  Airway & Oxygen Therapy: Patient Spontanous Breathing and Patient connected to face mask oxygen  Post-op Assessment: Report given to PACU RN, Post -op Vital signs reviewed and stable and Patient moving all extremities  Post vital signs: Reviewed and stable  Complications: No apparent anesthesia complications

## 2012-07-10 NOTE — H&P (Signed)
Patient seen and examined.  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  Labs reviewed.

## 2012-07-10 NOTE — Preoperative (Signed)
Beta Blockers   Reason not to administer Beta Blockers:Not Applicable 

## 2012-07-10 NOTE — Progress Notes (Signed)
Day of Surgery  Subjective: No pain or complaints.  Urine blood tinged.  Objective: Vital signs in last 24 hours: Temp:  [97.5 F (36.4 C)-97.9 F (36.6 C)] 97.9 F (36.6 C) (03/28 1645) Pulse Rate:  [58-97] 70 (03/28 1645) Resp:  [11-20] 14 (03/28 1645) BP: (134-198)/(74-176) 156/90 mmHg (03/28 1645) SpO2:  [95 %-100 %] 100 % (03/28 1645)    Intake/Output from previous day:   Intake/Output this shift: Total I/O In: 200 [I.V.:200] Out: 600 [Urine:600]  Right flank:  Nontender.  No bruising.  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  The patient now presents for percutaneous cryoablation.  CT-GUIDED PERCUTANEOUS CRYOABLATION OF RIGHT KIDNEY.  Anesthesia:  General  Medications:  2 grams IV Ancef. As antibiotic prophylaxis, Ancef was ordered pre-procedure and administered intravenously within one hour of incision.  Procedure:  The procedure, risks, benefits, and alternatives were explained to the patient.  Questions regarding the procedure were encouraged and answered.  The patient understands and consents to the procedure.  The patient was placed under general anesthesia.  Initial unenhanced CT was performed in a prone position to localize the right renal mass.  The right flank region was prepped with Betadine in a sterile fashion, and a sterile drape was applied covering the operative field.  A sterile gown and sterile gloves were used for the procedure.  Under CT guidance, a series of four separate  Galil Ice Rod Plus percutaneous cryoablation probes were      advanced into the right renal mass.  Probe positioning was confirmed by CT prior to cryoablation. Hydrodissection was also performed with advancement of a 22-gauge spinal needle.  Approximately 100 ml of sterile saline was injected along the lateral deep margin of the tumor. Prior to cryoablation, a cyst just lateral to the solid tumor was also decompressed via an 18 gauge trocar needle.  Cryoablation was performed through the four probes.  Initial 8 minute cycle of cryoablation was performed.  This was followed by a 5 minute thaw cycle.  A second 8 minute cycle of cryoablation was then performed.  During ablation, periodic CT imaging was performed to monitor ice ball formation and morphology.  After active thaw, the cryoablation probes were removed.  Post-procedural CT was performed.  Complications: None  Findings:  Along the inferior margin of the right kidney, the solid oncocytic tumor is again visualized with maximal transverse dimensions of approximately 2.8 x 3.0 cm.  Lateral adjacent cyst is also again identified.  After positioning cryoablation probes adequately within the tumor, it did become evident that the deep margin of the tumor was as close as 5 mm away from a transverse loop of small bowel.  In order to increase distance between the lesion and bowel, the lateral cyst was decompressed and fluid also injected in the space between the bowel and the lesion.  This did result in some increased separation of the mass from the small bowel.  During cryoablation, monitoring by CT shows  adequate ice ball formation encompassing the solid tumor.  IMPRESSION:  CT guided percutaneous cryoablation of right renal oncocytic neoplasm.  The patient will be observed overnight.  Initial follow- up will be performed in approximately 4 weeks.   Original Report Authenticated By: Irish Lack, M.D.     Anti-infectives: Anti-infectives   None       Assessment/Plan: s/p Procedure(s): RIGHT RENAL CRYO ABLATION (Right)  Observe overnight.  Follow hematuria.  Check labs in AM.   LOS: 0 days    William [name] T 07/10/2012

## 2012-07-11 LAB — BASIC METABOLIC PANEL
BUN: 14 mg/dL (ref 6–23)
Calcium: 7.7 mg/dL — ABNORMAL LOW (ref 8.4–10.5)
GFR calc Af Amer: 63 mL/min — ABNORMAL LOW (ref >90)
GFR calc non Af Amer: 54 mL/min — ABNORMAL LOW (ref >90)
Glucose, Bld: 108 mg/dL — ABNORMAL HIGH (ref 70–99)
Potassium: 3.2 mEq/L — ABNORMAL LOW (ref 3.5–5.1)
Sodium: 139 mEq/L (ref 135–145)

## 2012-07-11 LAB — CBC
MCH: 30.2 pg (ref 26.0–34.0)
MCHC: 32.6 g/dL (ref 30.0–36.0)
Platelets: 120 10*3/uL — ABNORMAL LOW (ref 150–400)
RBC: 4.21 MIL/uL — ABNORMAL LOW (ref 4.22–5.81)

## 2012-07-11 NOTE — Discharge Summary (Signed)
Physician Discharge Summary  Patient ID: William [name] MRN: [id number] DOB/AGE: 06/18/1928 79 y.o.  Admit date: 07/10/2012 Discharge date: 07/11/2012  Admission Diagnoses: Rt renal mass: oncocytic neoplasm- enlarging per CT  Discharge Diagnoses: Rt renal mass cryoablation treatment  Active Problems: Thyroid Ca; Skin Ca; HLD; HTN; TIA; BPH; Parkinson Dz  Discharged Condition: improved; stable  Hospital Course: Enlarging rt renal mass per CT.  Oncocytic neoplasm per biopsy. Consulted with Dr Fredia Sorrow regarding cryoablation. Rt renal mass cryoablation was performed 3/28 in IR with Dr Fredia Sorrow without complication.  Was admitted overnight for observation. Overnight stay was without event.  Pt has eaten without N/V. Slept well. Urinating; UOP good- dark yellow but no blood per pt and RN. Passing gas; No BM yet. Ambulating in halls. No complaint of pain. I have seen and examined pt and discussed case with Dr Miles Costain. Will DC pt now to home. Continue all home meds. Will follow up with Dr Fredia Sorrow; scheduler will call pt with time and date. He has good understanding of plan.  Consults: None  Significant Diagnostic Studies: CT Abd with localization of Rt renal mass  Treatments: Rt renal mass cryoablation  Discharge Exam: Blood pressure 113/56, pulse 61, temperature 98.3 F (36.8 C), temperature source Oral, resp. rate 16, height 6\' 4"  (1.93 m), weight 180 lb (81.647 kg), SpO2 96.00%.  PE:  Appropriate; A/O Heart: RRR Lungs: CTA Abd: soft; NT; +BS Rt flank: site clean and dry; NT; no no bleeding; No hematoma Extr: FROM; ambulating well UOP 1.1 L 3/28; 200 cc today; dark yellow  Results for orders placed during the hospital encounter of 07/10/12  BASIC METABOLIC PANEL      Result Value Range   Sodium 139  135 - 145 mEq/L   Potassium 3.2 (*) 3.5 - 5.1 mEq/L   Chloride 101  96 - 112 mEq/L   CO2 29  19 - 32 mEq/L   Glucose, Bld 108 (*) 70 - 99 mg/dL   BUN 14  6 - 23 mg/dL   Creatinine, Ser 5.62  0.50 - 1.35 mg/dL   Calcium 7.7 (*) 8.4 - 10.5 mg/dL   GFR calc non Af Amer 54 (*) >90 mL/min   GFR calc Af Amer 63 (*) >90 mL/min  CBC      Result Value Range   WBC 5.6  4.0 - 10.5 K/uL   RBC 4.21 (*) 4.22 - 5.81 MIL/uL   Hemoglobin 12.7 (*) 13.0 - 17.0 g/dL   HCT 13.0 (*) 86.5 - 78.4 %   MCV 92.4  78.0 - 100.0 fL   MCH 30.2  26.0 - 34.0 pg   MCHC 32.6  30.0 - 36.0 g/dL   RDW 69.6  29.5 - 28.4 %   Platelets 120 (*) 150 - 400 K/uL    Disposition: Rt renal mass cryoablation performed in IR with Dr Fredia Sorrow 07/10/12 Pt has done well overnight. No pain. No bleeding. DC to home now. Cont all home meds. I have seen and examined pt this am; discussed with Dr Miles Costain Follow up with Dr Fredia Sorrow in 2-4 weeks; scheduler will call pt with time and date. Call Tammy [phone]- scheduler; if needed Call [phone] if problems or concerns. Push fluids. Pt has good understanding of DC instructions   Discharge Orders   Future Appointments Provider Department Dept Phone   09/03/2012 12:00 PM Huston Foley, MD [facility] [phone]   12/10/2012 10:00 AM Michele Mcalpine, MD [facility] [phone]   Future Orders Complete  By Expires     Call MD for:  persistant nausea and vomiting  As directed     Call MD for:  redness, tenderness, or signs of infection (pain, swelling, redness, odor or green/yellow discharge around incision site)  As directed     Call MD for:  severe uncontrolled pain  As directed     Call MD for:  temperature >100.4  As directed     Diet - low sodium heart healthy  As directed     Discharge instructions  As directed     Comments:      Follow up with Dr Fredia Sorrow in 2-4 weeks; scheduler will call pt with appt date and time- [phone]; call [phone] if problems or concerns    Discharge wound care:  As directed     Comments:      May shower today; keep bandaid on site x 3-5 days; change daily    Driving Restrictions  As  directed     Comments:      No driving til 2/95    Increase activity slowly  As directed     Lifting restrictions  As directed     Comments:      No lifting over 10 lbs x 2-3 days    Other Restrictions  As directed     Comments:      Restful x 3-5 days        Medication List    TAKE these medications       aspirin EC 81 MG tablet  Take 81 mg by mouth daily.     carbidopa-levodopa 25-100 MG per tablet  Commonly known as:  SINEMET IR  Take 1 tablet by mouth 3 (three) times daily.     doxazosin 8 MG tablet  Commonly known as:  CARDURA  Take 4 mg by mouth at bedtime.     levothyroxine 200 MCG tablet  Commonly known as:  SYNTHROID, LEVOTHROID  Take 100-200 mcg by mouth daily. Alternates daily by taking (1 tablet) once daily and then taking ( tablet) the next day, and so on.     losartan-hydrochlorothiazide 100-25 MG per tablet  Commonly known as:  HYZAAR  Take 1 tablet by mouth daily before breakfast.     multivitamin with minerals Tabs  Take 1 tablet by mouth daily.     simvastatin 40 MG tablet  Commonly known as:  ZOCOR  Take 20 mg by mouth every evening. Takes 1/2 tablet         Signed: Serena Petterson A 07/11/2012, 8:48 AM

## 2012-07-11 NOTE — Progress Notes (Signed)
Pt was stable at time of discharge education. Removed pt's IV. Reviewed discharge packet with pt and wife. They did not have further questions after education and were able to verbally respond that they understood discharge education.

## 2012-07-13 NOTE — Discharge Summary (Signed)
Agree.  Clinic follow up in 4 weeks.

## 2012-07-15 ENCOUNTER — Other Ambulatory Visit (HOSPITAL_COMMUNITY): Payer: Self-pay | Admitting: Interventional Radiology

## 2012-07-15 ENCOUNTER — Other Ambulatory Visit: Payer: Self-pay | Admitting: Radiology

## 2012-07-15 DIAGNOSIS — N2889 Other specified disorders of kidney and ureter: Secondary | ICD-10-CM

## 2012-07-28 ENCOUNTER — Telehealth: Payer: Self-pay | Admitting: Pulmonary Disease

## 2012-07-28 MED ORDER — LOSARTAN POTASSIUM-HCTZ 100-25 MG PO TABS: 1.0000 | ORAL_TABLET | Freq: Every day | ORAL | Status: DC

## 2012-07-28 NOTE — Telephone Encounter (Signed)
Refill has been sent back for the losartan/hct  #90  With 3 refills.

## 2012-07-28 NOTE — Telephone Encounter (Signed)
primemail requesting  Losartan/hct 100-25 mg ><take 1 tablet daily .  #90 No Known Allergies Dr Kriste Basque please advise thank you

## 2012-08-10 LAB — CREATININE WITH EST GFR: GFR, Est Non African American: 46 mL/min — ABNORMAL LOW

## 2012-08-10 LAB — BUN: BUN: 18 mg/dL (ref 6–23)

## 2012-08-18 ENCOUNTER — Ambulatory Visit (HOSPITAL_COMMUNITY)
Admission: RE | Admit: 2012-08-18 | Discharge: 2012-08-18 | Disposition: A | Discharge status: Home / Self Care | Payer: Medicare Other | Source: Ambulatory Visit | Attending: Interventional Radiology | Admitting: Interventional Radiology

## 2012-08-18 ENCOUNTER — Ambulatory Visit
Admission: RE | Admit: 2012-08-18 | Discharge: 2012-08-18 | Disposition: A | Discharge status: Home / Self Care | Payer: Medicare Other | Source: Ambulatory Visit | Attending: Radiology | Admitting: Radiology

## 2012-08-18 ENCOUNTER — Encounter (HOSPITAL_COMMUNITY): Payer: Self-pay

## 2012-08-18 DIAGNOSIS — I77811 Abdominal aortic ectasia: Secondary | ICD-10-CM | POA: Insufficient documentation

## 2012-08-18 DIAGNOSIS — N2889 Other specified disorders of kidney and ureter: Secondary | ICD-10-CM

## 2012-08-18 DIAGNOSIS — N281 Cyst of kidney, acquired: Secondary | ICD-10-CM | POA: Insufficient documentation

## 2012-08-18 DIAGNOSIS — D4959 Neoplasm of unspecified behavior of other genitourinary organ: Secondary | ICD-10-CM | POA: Insufficient documentation

## 2012-08-18 DIAGNOSIS — IMO0002 Reserved for concepts with insufficient information to code with codable children: Secondary | ICD-10-CM | POA: Insufficient documentation

## 2012-08-18 DIAGNOSIS — I700 Atherosclerosis of aorta: Secondary | ICD-10-CM | POA: Insufficient documentation

## 2012-08-18 DIAGNOSIS — K5710 Diverticulosis of small intestine without perforation or abscess without bleeding: Secondary | ICD-10-CM | POA: Insufficient documentation

## 2012-08-18 DIAGNOSIS — D7389 Other diseases of spleen: Secondary | ICD-10-CM | POA: Insufficient documentation

## 2012-08-18 DIAGNOSIS — K838 Other specified diseases of biliary tract: Secondary | ICD-10-CM | POA: Insufficient documentation

## 2012-08-18 MED ORDER — IOHEXOL 300 MG/ML  SOLN
75.0000 mL | Freq: Once | INTRAMUSCULAR | Status: AC | PRN
  Administered 2012-08-18: 75 mL via INTRAVENOUS

## 2012-08-18 NOTE — Progress Notes (Signed)
Denies hematuria or any other urinary problems.   Initially post procedure did experienced "dull ache" of Right flank.  Resolved w/o Rx.  Endurance improving.   Has been doing some yard work this week.  Takes rest intervals as needed.  Overall doing well.

## 2012-09-03 ENCOUNTER — Ambulatory Visit (INDEPENDENT_AMBULATORY_CARE_PROVIDER_SITE_OTHER): Payer: Medicare Other | Admitting: Neurology

## 2012-09-03 ENCOUNTER — Encounter: Payer: Self-pay | Admitting: Neurology

## 2012-09-03 VITALS — BP 127/71 | HR 55 | Temp 97.8°F | Ht 76.0 in | Wt 181.0 lb

## 2012-09-03 DIAGNOSIS — G20 Parkinson's disease: Secondary | ICD-10-CM

## 2012-09-03 MED ORDER — CARBIDOPA-LEVODOPA 25-100 MG PO TABS: 1.0000 | ORAL_TABLET | Freq: Three times a day (TID) | ORAL | Status: DC

## 2012-09-03 MED ORDER — RASAGILINE MESYLATE 1 MG PO TABS: 1.0000 mg | ORAL_TABLET | Freq: Every day | ORAL | Status: DC

## 2012-09-03 NOTE — Progress Notes (Signed)
Subjective:    Patient ID: William [name] is a 79 y.o. male.  HPI  Interim history:   William [name] is a 79 year old right-handed gentleman who presents for followup consultation of his history of [name]. He is accompanied by his lady friend again today. I first met him on 06/03/2012 and he previously followed by Dr. Fayrene Fearing love. He has an underlying medical history of right TIA in January 2003, subdural hematoma, status post left craniotomy in August 2006, hypertension, hypothyroidism, thyroid cancer, partial onset seizures, off Dilantin. He is primarily right-sided symptoms with regards to his Parkinson's, diagnosed in 2010 with symptoms dating back to late 2009 or early 2010. He had briefly tried Mirapex but was taken off d/t hypotension. He has lumbar spine disease. He had L. spine MRI in June 2013 showing renal cysts, degenerative joint disease most prominent at L4-5. He has tried acupuncture and has been on your pets in the past. His MRI of the lumbar spine showed abnormalities with his kidneys and had ablative surgery on 07/10/12 and recently had CT done and is supposed to have a 6 mo FU for his renal tumor. He had lower back surgery as well on 05/14/12. His current medications are Metamucil, Cardura, Hyzaar, Synthroid, Zocor, baby aspirin, multivitamin, Cialis, fish oil, carbidopa-levodopa 1 tablet 3 times a day 25-100 mg strength. He takes 9-9:30, 1-1:30 PM and 7-7:30. He reports no new symptoms and has resumed playing golf.    His Past Medical History Is Significant For: Past Medical History  Diagnosis Date  . Atherosclerotic vascular disease   . Hyperlipidemia   . Hx of colonic polyps   . Benign prostatic hypertrophy with urinary obstruction   . TIA (transient ischemic attack)   . Subdural hematoma   . Tremor   . DJD (degenerative joint disease)   . Gout   . Malignant neoplasm of thyroid gland   . Primary skin squamous cell carcinoma   . Complication of anesthesia      "difficulty intubation, had to use fiberoptic 2006"  . Hypertension     sees Dr. Alroy Dust  . Thyroid disease   . Pneumonia     hx of in 1952  . Kidney tumor   . Parkinson disease   . Difficult intubation   . Hypothyroidism     His Past Surgical History Is Significant For: Past Surgical History  Procedure Laterality Date  . Cataract sugery    . Cholecystectomy    . Right inguinal hernia repair    . Thyroidectomy for medullary carcinoma    . Eye surgery    . Knee arthroscopy    . Appendectomy    . Tonsillectomy    . Lumbar laminectomy/decompression microdiscectomy  05/14/2012    Procedure: LUMBAR LAMINECTOMY/DECOMPRESSION MICRODISCECTOMY 1 LEVEL;  Surgeon: Clydene Fake, MD;  Location: [facility];  Service: Neurosurgery;  Laterality: Left;  Left Lumbar four-five Laminectomy/Diskectomy/Far lateral diskectomy  . Kidney surgery  07-10-12    His Family History Is Significant For: History reviewed. No pertinent family history.  His Social History Is Significant For: History   Social History  . Marital Status: Widowed    Spouse Name: N/A    Number of Children: 3  . Years of Education: N/A   Occupational History  . retired    Social History Main Topics  . Smoking status: Former Smoker -- 0.80 packs/day for 15 years    Quit date: 04/15/1965  . Smokeless tobacco: Never Used  . Alcohol  Use: Yes     Comment: 1 glass of wine every night  . Drug Use: No  . Sexually Active: None   Other Topics Concern  . None   Social History Narrative  . None    His Allergies Are:  No Known Allergies:   His Current Medications Are:  Outpatient Encounter Prescriptions as of 09/03/2012  Medication Sig Dispense Refill  . aspirin EC 81 MG tablet Take 81 mg by mouth daily.      . carbidopa-levodopa (SINEMET IR) 25-100 MG per tablet Take 1 tablet by mouth 3 (three) times daily.  270 tablet  3  . doxazosin (CARDURA) 8 MG tablet Take 4 mg by mouth at bedtime.      Marland Kitchen levothyroxine  (SYNTHROID, LEVOTHROID) 200 MCG tablet Take 100-200 mcg by mouth daily. Alternates daily by taking (1 tablet) once daily and then taking ( tablet) the next day, and so on.      . losartan-hydrochlorothiazide (HYZAAR) 100-25 MG per tablet Take 1 tablet by mouth daily before breakfast.  90 tablet  3  . Multiple Vitamin (MULTIVITAMIN WITH MINERALS) TABS Take 1 tablet by mouth daily.      . simvastatin (ZOCOR) 40 MG tablet Take 20 mg by mouth every evening. Takes 1/2 tablet      . [DISCONTINUED] carbidopa-levodopa (SINEMET) 25-100 MG per tablet Take 1 tablet by mouth 3 (three) times daily.       . rasagiline (AZILECT) 1 MG TABS Take 1 tablet (1 mg total) by mouth daily. Take half a pill once daily in the morning for 2 weeks, then one pill once daily in the morning thereafter.  90 tablet  3   No facility-administered encounter medications on file as of 09/03/2012.  : Review of Systems  All other systems reviewed and are negative.    Objective:  Neurologic Exam  Physical Exam  Physical Examination:   Filed Vitals:   09/03/12 1153  BP: 127/71  Pulse: 55  Temp: 97.8 F (36.6 C)    General Examination: The patient is a very pleasant 79 y.o. male in no acute distress.  HEENT: He has moderate degree of nuchal rigidity with some decrease in passive range of motion. He reports no neck pain. Face is symmetric with decrease in facial animation. Speech is mildly low in volume but not dysarthric. Hearing is grossly intact. Extraocular tracking is fairly good with mild saccadic breakdown. Pupils are reactive to light. Airway is clear with mild mouth dryness noted.  Chest is clear to auscultation.  Heart sounds are normal without murmurs, rubs or gallops.  Abdomen is soft, nontender with normal bowel sounds. He has no pitting edema in the distal lower extremities. Skin is warm and dry. Neurologically: Mental status: The patient is awake, alert and oriented in all 4 spheres. His memory,  attention, language and knowledge are appropriate. He has no aphasia, agnosia, apraxia or anomia. Cranial nerves are as described under HEENT exam. Motor exam: Normal bulk and strength is noted. He has increased tone in the right upper extremity and to a lesser degree in the right lower extremity. No cogwheeling is noted. He has a moderate degree of resting tremor in the right upper extremity only. He has no other tremor. He has a mild to moderate postural and action tremor in the right upper extremity. Fine motor skills with finger taps and hand movements are moderately impaired on the right upper extremity and otherwise mildly impaired in the right lower extremity  and mildly impaired on the left side. He stands up fairly well from the seated position and his posture is age-appropriate. He walks with decreased arm swing on the right and turns fairly well. Balance is preserved. Sensory exam is intact to light touch, pinprick, vibration and temperature. Romberg is negative.  Assessment and Plan:   In summary, Mr. Suski is a very pleasant 79 year old gentleman with right-sided predominant, tremor predominant [name], in its 5th year. He has a complex history and is recently status post lower back surgery at level L4/5 in January 2014, and kidney tumor ablative surgery in March 2013. He has done really well after his surgeries and has had physical therapy. His exam is stable and he continues with carbidopa-levodopa 3 times a day at this time. I suggested the addition of rasagiline at this time. We talked about the potential side effects of this medication and I have asked him to start with 0.5 mg once daily for 2 weeks and then increase it to 1 mg once daily thereafter. He was in agreement. I would like to see him back in 3 months from now, sooner if the need arises and encouraged him to call with any interim questions, concerns, refill request for updates.

## 2012-09-03 NOTE — Patient Instructions (Addendum)
I think overall you are doing fairly well and are stable at this point.   I do have some generic suggestions for you today:   Please make sure that you drink plenty of fluids. I would like for you to exercise daily for example in the form of walking 20-30 minutes every day, if you can. Please keep a regular sleep-wake schedule, keep regular meal times, do not skip any meals, eat  healthy snacks in between meals, such as fruit or nuts. Try to eat protein with every meal.   As far as your medications are concerned, I would like to suggest: continue with sinemet generic three times a day. Add Azilect 1 mg: Take half a pill once daily in the morning for 2 weeks, then one pill once daily in the morning thereafter.   As far as diagnostic testing, I recommend: no new test.   Engage in social activities in your community and with your family and try to keep up with current events by reading the newspaper or watching the news.  I think you're stable enough that I can see you back in 3 months, sooner if we need to. Please call us if you have any interim questions, concerns, or problems or updates to need to discuss.  Brett Canales is my clinical assistant and will answer any of your questions and relay your messages to me and will give you my messages.   Our phone number is [phone]. We also have an after hours call service for urgent matters and there is a physician on-call for urgent questions. For any emergencies you know to call 911 or go to the nearest emergency room.

## 2012-09-16 ENCOUNTER — Telehealth: Payer: Self-pay | Admitting: Neurology

## 2012-09-16 NOTE — Telephone Encounter (Signed)
Dr. Yan please advise. 

## 2012-09-17 ENCOUNTER — Telehealth: Payer: Self-pay

## 2012-09-17 DIAGNOSIS — G20 Parkinson's disease: Secondary | ICD-10-CM

## 2012-09-17 MED ORDER — RASAGILINE MESYLATE 1 MG PO TABS: ORAL_TABLET | ORAL | Status: DC

## 2012-09-17 MED ORDER — RASAGILINE MESYLATE 1 MG PO TABS: 1.0000 mg | ORAL_TABLET | Freq: Every day | ORAL | Status: DC

## 2012-09-17 NOTE — Telephone Encounter (Signed)
[facility] called, left message.  They said patient would like to get first month of Azilect with titration dose there and get continuing Rx via mail order.  I have sent Rx's

## 2012-09-17 NOTE — Telephone Encounter (Signed)
[name], would you please take care of it, you saw her in May 22nd. William [name]

## 2012-09-18 NOTE — Telephone Encounter (Signed)
pls ask pt what question he has regarding Azilect? Huston Foley, MD, PhD [facility])

## 2012-09-21 NOTE — Telephone Encounter (Signed)
I called pt and LMVM re: if has questions or concerns about medication and has not been addressed to please call me back.

## 2012-12-07 ENCOUNTER — Encounter: Payer: Self-pay | Admitting: Neurology

## 2012-12-07 ENCOUNTER — Ambulatory Visit (INDEPENDENT_AMBULATORY_CARE_PROVIDER_SITE_OTHER): Payer: Medicare Other | Admitting: Neurology

## 2012-12-07 ENCOUNTER — Encounter: Payer: Self-pay | Admitting: Gastroenterology

## 2012-12-07 VITALS — BP 120/65 | HR 58 | Temp 96.9°F | Ht 76.0 in | Wt 178.0 lb

## 2012-12-07 DIAGNOSIS — Z8673 Personal history of transient ischemic attack (TIA), and cerebral infarction without residual deficits: Secondary | ICD-10-CM

## 2012-12-07 DIAGNOSIS — Z8669 Personal history of other diseases of the nervous system and sense organs: Secondary | ICD-10-CM

## 2012-12-07 DIAGNOSIS — G20 Parkinson's disease: Secondary | ICD-10-CM

## 2012-12-07 DIAGNOSIS — D4959 Neoplasm of unspecified behavior of other genitourinary organ: Secondary | ICD-10-CM

## 2012-12-07 DIAGNOSIS — Z87898 Personal history of other specified conditions: Secondary | ICD-10-CM

## 2012-12-07 DIAGNOSIS — M5136 Other intervertebral disc degeneration, lumbar region: Secondary | ICD-10-CM

## 2012-12-07 DIAGNOSIS — M5137 Other intervertebral disc degeneration, lumbosacral region: Secondary | ICD-10-CM

## 2012-12-07 DIAGNOSIS — D49519 Neoplasm of unspecified behavior of unspecified kidney: Secondary | ICD-10-CM

## 2012-12-07 DIAGNOSIS — Z8679 Personal history of other diseases of the circulatory system: Secondary | ICD-10-CM

## 2012-12-07 MED ORDER — RASAGILINE MESYLATE 1 MG PO TABS: 1.0000 mg | ORAL_TABLET | Freq: Every day | ORAL | Status: DC

## 2012-12-07 NOTE — Progress Notes (Signed)
Subjective:    Patient ID: William [name] is a 79 y.o. male.  HPI  Interim history:    William [name] is a 79 year old right-handed gentleman who presents for followup consultation of his history of [name]. He is accompanied by his lady friend, William [name], again today. I first met him on 06/03/2012 and he previously followed by Dr. Fayrene Fearing love. He has an underlying medical history of right TIA in January 2003, subdural hematoma, status post left craniotomy in August 2006, hypertension, hypothyroidism, thyroid cancer, partial onset seizures, off Dilantin. He is primarily right-sided symptoms with regards to his Parkinson's, diagnosed in 2010 with symptoms dating back to late 2009 or early 2010. He had briefly tried Mirapex but was taken off d/t hypotension. He has lumbar spine disease. He had L. spine MRI in June 2013 showing renal cysts, degenerative joint disease most prominent at L4-5. He has tried acupuncture. His MRI of the lumbar spine showed abnormalities with his kidney with a cyst and a tumor and had ablative surgery on 07/10/12 and recently had CT done and is supposed to have a 6 mo FU for his renal tumor. He had lower back surgery as well on 05/14/12. His current medications are Metamucil, Cardura, Hyzaar, Synthroid, Zocor, baby aspirin, multivitamin, Cialis, fish oil, carbidopa-levodopa 1 tablet 3 times a day 25-100 mg strength. He takes 8:30 to 9, 1-1:30 PM and 7-7:30. I saw him back on 09/03/2012 and I suggested starting him on Azilect. He has been tolerating it well and both he and his girlfriend feel that he has done better with it in terms of dexterity and fine motor control. In fact, he states that he was able to fix the toilet recently which required some fine motor control which he was very pleased to see that he was able to do this. He had some hypotension and lightheadedness and has been breaking the BP medication in half and takes it twice daily. He feels better in all respects,  has been having a better mood as well. He has been having some issue with gout.   His Past Medical History Is Significant For: Past Medical History  Diagnosis Date  . Atherosclerotic vascular disease   . Hyperlipidemia   . Hx of colonic polyps   . Benign prostatic hypertrophy with urinary obstruction   . TIA (transient ischemic attack)   . Subdural hematoma   . Tremor   . DJD (degenerative joint disease)   . Gout   . Malignant neoplasm of thyroid gland   . Primary skin squamous cell carcinoma   . Complication of anesthesia     "difficulty intubation, had to use fiberoptic 2006"  . Hypertension     sees Dr. Alroy Dust  . Thyroid disease   . Pneumonia     hx of in 1952  . Kidney tumor   . Parkinson disease   . Difficult intubation   . Hypothyroidism     His Past Surgical History Is Significant For: Past Surgical History  Procedure Laterality Date  . Cataract sugery    . Cholecystectomy    . Right inguinal hernia repair    . Thyroidectomy for medullary carcinoma    . Eye surgery    . Knee arthroscopy    . Appendectomy    . Tonsillectomy    . Lumbar laminectomy/decompression microdiscectomy  05/14/2012    Procedure: LUMBAR LAMINECTOMY/DECOMPRESSION MICRODISCECTOMY 1 LEVEL;  Surgeon: Clydene Fake, MD;  Location: [facility];  Service: Neurosurgery;  Laterality:  Left;  Left Lumbar four-five Laminectomy/Diskectomy/Far lateral diskectomy  . Kidney surgery  07-10-12   His Family History Is Significant For: History reviewed. No pertinent family history.  His Social History Is Significant For: History   Social History  . Marital Status: Widowed    Spouse Name: N/A    Number of Children: 3  . Years of Education: N/A   Occupational History  . retired    Social History Main Topics  . Smoking status: Former Smoker -- 0.80 packs/day for 15 years    Quit date: 04/15/1965  . Smokeless tobacco: Never Used  . Alcohol Use: Yes     Comment: 1 glass of wine every night  .  Drug Use: No  . Sexual Activity: None   Other Topics Concern  . None   Social History Narrative  . None    His Allergies Are:  No Known Allergies:   His Current Medications Are:  Outpatient Encounter Prescriptions as of 12/07/2012  Medication Sig Dispense Refill  . aspirin EC 81 MG tablet Take 81 mg by mouth daily.      . carbidopa-levodopa (SINEMET IR) 25-100 MG per tablet Take 1 tablet by mouth 3 (three) times daily.  270 tablet  3  . doxazosin (CARDURA) 8 MG tablet Take 4 mg by mouth at bedtime.      Marland Kitchen levothyroxine (SYNTHROID, LEVOTHROID) 200 MCG tablet Take 100-200 mcg by mouth daily. Alternates daily by taking (1 tablet) once daily and then taking ( tablet) the next day, and so on.      . losartan-hydrochlorothiazide (HYZAAR) 100-25 MG per tablet Take 1 tablet by mouth daily before breakfast.  90 tablet  3  . Multiple Vitamin (MULTIVITAMIN WITH MINERALS) TABS Take 1 tablet by mouth daily.      . rasagiline (AZILECT) 1 MG TABS Take 1 tablet (1 mg total) by mouth daily.  90 tablet  3  . simvastatin (ZOCOR) 40 MG tablet Take 20 mg by mouth every evening. Takes 1/2 tablet       No facility-administered encounter medications on file as of 12/07/2012.  : Review of Systems  Neurological: Positive for tremors.    Objective:  Neurologic Exam  Physical Exam Physical Examination:   Filed Vitals:   12/07/12 1511  BP: 120/65  Pulse: 58  Temp: 96.9 F (36.1 C)   General Examination: The patient is a very pleasant 79 y.o. male in no acute distress.  HEENT: He has moderate degree of nuchal rigidity with some decrease in passive range of motion. He reports no neck pain. Face is symmetric with decrease in facial animation. Speech is mildly low in volume but not dysarthric. Hearing is grossly intact. Extraocular tracking is fairly good with mild saccadic breakdown. Pupils are reactive to light. Airway is clear with mild mouth dryness noted.  Chest is clear to  auscultation without wheezing, or rhonchi or crackles noted.  Heart sounds are normal without murmurs, rubs or gallops.  Abdomen is soft, nontender with normal bowel sounds. He has no pitting edema in the distal lower extremities. Skin is warm and dry. Neurologically: Mental status: The patient is awake, alert and oriented in all 4 spheres. His memory, attention, language and knowledge are appropriate. He has no aphasia, agnosia, apraxia or anomia. Cranial nerves are as described under HEENT exam. Motor exam: Normal bulk and strength is noted. He has increased tone in the right upper extremity and to a lesser degree in the right lower extremity. No  cogwheeling is noted. He has a moderate degree of resting tremor in the right upper extremity only. He has no other tremor. He has a mild to moderate postural and action tremor in the right upper extremity. Fine motor skills with finger taps and hand movements are moderately impaired on the right upper extremity and otherwise mildly impaired in the right lower extremity and mildly impaired on the left side. He stands up fairly well from the seated position and his posture is age-appropriate. He walks with decreased arm swing on the right and turns fairly well. Balance is preserved. Sensory exam is intact to light touch, pinprick, vibration and temperature. Romberg is negative.  Assessment and Plan:   In summary, Mr. Tilly is a very pleasant 79 year old gentleman with right-sided predominant, tremor predominant [name], in its 5th+ year. He has a complex history and is recently status post lower back surgery at level L4/5 in January 2014, and kidney tumor ablative surgery in March 2014. He has done really well after his surgeries and has had physical therapy. His exam is stable and he feels better since the addition of Azilect. He continues with carbidopa-levodopa 3 times a day at this time. I suggested that I see him back in about 6 months. At some  point we may have to introduce a fourth pill of levodopa but at this point he is stable and the adhe also would like to stay with the current regimen. He requested a 90 day prescription for Azilect and requested a hard copy for this. I encouraged him to call with any interim questions, concerns, refill request for updates. They were in agreement.

## 2012-12-07 NOTE — Patient Instructions (Addendum)
I think overall you are doing fairly well but I do want to suggest a few things today:  Remember to drink plenty of fluid, eat healthy meals and do not skip any meals. Try to eat protein with a every meal and eat a healthy snack such as fruit or nuts in between meals. Try to keep a regular sleep-wake schedule and try to exercise daily, particularly in the form of walking, 20-30 minutes a day, if you can.   Engage in social activities in your community and with your family and try to keep up with current events by reading the newspaper or watching the news.   As far as your medications are concerned, I would like to suggest    As far as diagnostic testing: no new test.   I would like to see you back in 6 months, sooner if we need to. Please call us with any interim questions, concerns, problems, updates or refill requests.  Please also call us for any test results so we can go over those with you on the phone. Brett Canales is my clinical assistant and will answer any of your questions and relay your messages to me and also relay most of my messages to you.  Our phone number is [phone]. We also have an after hours call service for urgent matters and there is a physician on-call for urgent questions. For any emergencies you know to call 911 or go to the nearest emergency room.

## 2012-12-10 ENCOUNTER — Encounter: Payer: Self-pay | Admitting: Pulmonary Disease

## 2012-12-10 ENCOUNTER — Ambulatory Visit (INDEPENDENT_AMBULATORY_CARE_PROVIDER_SITE_OTHER): Payer: Medicare Other | Admitting: Pulmonary Disease

## 2012-12-10 ENCOUNTER — Other Ambulatory Visit (INDEPENDENT_AMBULATORY_CARE_PROVIDER_SITE_OTHER): Payer: Medicare Other

## 2012-12-10 VITALS — BP 120/72 | HR 55 | Temp 97.7°F | Ht 76.0 in | Wt 178.8 lb

## 2012-12-10 DIAGNOSIS — D126 Benign neoplasm of colon, unspecified: Secondary | ICD-10-CM

## 2012-12-10 DIAGNOSIS — E039 Hypothyroidism, unspecified: Secondary | ICD-10-CM

## 2012-12-10 DIAGNOSIS — G20A1 Parkinson's disease without dyskinesia, without mention of fluctuations: Secondary | ICD-10-CM

## 2012-12-10 DIAGNOSIS — I7090 Unspecified atherosclerosis: Secondary | ICD-10-CM

## 2012-12-10 DIAGNOSIS — G20 Parkinson's disease: Secondary | ICD-10-CM

## 2012-12-10 DIAGNOSIS — E785 Hyperlipidemia, unspecified: Secondary | ICD-10-CM

## 2012-12-10 DIAGNOSIS — N138 Other obstructive and reflux uropathy: Secondary | ICD-10-CM

## 2012-12-10 DIAGNOSIS — N401 Enlarged prostate with lower urinary tract symptoms: Secondary | ICD-10-CM

## 2012-12-10 DIAGNOSIS — M1990 Unspecified osteoarthritis, unspecified site: Secondary | ICD-10-CM

## 2012-12-10 DIAGNOSIS — D3000 Benign neoplasm of unspecified kidney: Secondary | ICD-10-CM

## 2012-12-10 DIAGNOSIS — I10 Essential (primary) hypertension: Secondary | ICD-10-CM

## 2012-12-10 DIAGNOSIS — C73 Malignant neoplasm of thyroid gland: Secondary | ICD-10-CM

## 2012-12-10 DIAGNOSIS — I6200 Nontraumatic subdural hemorrhage, unspecified: Secondary | ICD-10-CM

## 2012-12-10 DIAGNOSIS — C4490 Unspecified malignant neoplasm of skin, unspecified: Secondary | ICD-10-CM

## 2012-12-10 LAB — CBC WITH DIFFERENTIAL/PLATELET
Basophils Absolute: 0 10*3/uL (ref 0.0–0.1)
Eosinophils Relative: 3.5 % (ref 0.0–5.0)
HCT: 42.2 % (ref 39.0–52.0)
Lymphocytes Relative: 20.7 % (ref 12.0–46.0)
Lymphs Abs: 0.8 10*3/uL (ref 0.7–4.0)
Monocytes Relative: 12.4 % — ABNORMAL HIGH (ref 3.0–12.0)
Neutrophils Relative %: 62.2 % (ref 43.0–77.0)
Platelets: 142 10*3/uL — ABNORMAL LOW (ref 150.0–400.0)
WBC: 3.9 10*3/uL — ABNORMAL LOW (ref 4.5–10.5)

## 2012-12-10 NOTE — Patient Instructions (Addendum)
Today we updated your med list in our EPIC system...    Continue your current medications the same...  Today we did your follow up FASTING blood work...    We will contact you w/ the results when available...   Call for any questions...  Let's plan a follow up visit in 6mo, sooner if needed for problems...   

## 2012-12-10 NOTE — Progress Notes (Signed)
Subjective:    Patient ID: William [name], male    DOB: 12-15-1928, 79 y.o.   MRN: [id number]  HPI 79 y/o WM here for a follow up visit... he has multiple medical problems as noted below... followed for HBP, Carotid vasc dis w/ hx of TIA & prev subdural hematoma, Chol, & prev surg for medullary thyroid carcinoma... his wife passed away from metastatic breast cancer 11/09...  ~  June 06, 2011:  1mo ROV & William [name] states he is doing satis, w/o new complaints or concerns... He had a left lower lid ectropion repair 12/12 by DrZaldivar in [facility], [place].    BP remains controlled on meds; BP= 122/60 today & he remains asymptomatic w/o CP, palpit, SOB, edema, etc...    He remains on ASA81 & denies any cerebral ischemic symptoms; he has f/u appt soon w/ DrLove for f/u Parkinson's dis (on Sinemet)...    Chol looks good on Simva40 (see below)...     Hx medullary thyroid cancer, followed by DrDNewman, TSH today = 2.29 on Synthroid260mcg- 1x4d=TThSS & 1/2 x3d=MWF... We reviewed his problem list, meds, XRays & Labs today...  ~  December 05, 2011:  1mo ROV & William [name] relates lifting a mattress several months ago w/ back pain> eval by Alinda Sierras, DrBrooks w/ MRI showing bulging discs at L4-5 & treated w/ Pred w/o benefit, then ESI and this helped some but he has persistent left leg symptoms (on Hydrocodone & Aleve) and can't play golf yet; he had second opinion DrHirsh for [employer] they are waiting on surg;  Incidental finding on the MRI was a renal lesion ==> referred to DrGrapey & they have consulted DrYamagata, IR for cyst drainage and bx of lesion (sched for 9/13- may need cryoablation if it's a cancer)...    BP remains controlled on Hyzaar & Cardura;  Chol remains at goals on Simva20;  Thyroid unchanged & stable on Synthroid Rx;  DrLove manages his Parkinson's & recently increased his Sinemet...    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  ~  June 09, 2012:  1mo ROV & William [name] is here w/ his new girlfriend, William [name]; he had back surg by DrHirsh 3 wks ago & is much improved he says; he notes a sinking feeling x2 since surg- both episodes occurred after eating  & while he was under a warm vent (he will avoid the vent, and observe for any recurrent episodes);  We reviewed the following medical problems during today's office visit >>     HBP> on Hyzaar100-12.5 & Cardura8; BP=138/80 today & denies CP, palpit, SOB, edema...    ASPVD> on ASA 81mg  daily w/o cerebral ischemic symptoms etc; he is off Plavix ever since his SDH...    Chol> on Simva20, Fish Oil & diet rx; last FLP 8/13 showed TChol 137, TG 73, HDL 53, LDL 70    Hx medullary thyroid cancer> on Synthroid200-taking 1/2 alt w/1 Qod; followed by [facility] for [facility]; TFTs remain wnl w/ TSH 8/13 = 0.69     GI-  Divertics, HxPolyps> last colon 2007 by DrSamLeB showed divertics, hems, no polyps & f/u suggested 66yrs but now>82y/o...    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    DJD, LBP> on Vicodin prn; he developed LBP after lifting a mattress 5/13; eval by DrBrooks & DrHirsh w/ MRI reported to show HNP at L4-5 (w/ myelopathy & radiculopathy); DrHirsh did LumbarLam 1/14 w/ decompression L4 & L5 w/ discectomy=> improved...    Neuro- TIA, Hx subdural hemtoma, Tremor/Parkinson's> Still followed by Autumn Patty for Neuro (& he reports consult at [facility] from DrScott- we do not have notes  from them) on Sinemet 25-100 Tid; "I'm doing exercises"    Hx skin cancer> followed by Derm; SCCa removed from right forearm in 2006... We reviewed prob list, meds, xrays and labs> see below for updates >> he had the 2013 Flu vaccine 10/13, up to date on others... LABS 1/14 preop in EPIC> Chems- ok & Cr=1.3-1.5;  CBC- ok  ~  December 10, 2012:  71mo ROV & William [name] indicates a good interval w/o new complaints or concerns;  He had his right renal mass ablated by [facility] 3/14 & he reports that was pretty painful but he has recovered & is back to playing golf now;  We reviewed the following medical problems during today's office visit >>     HBP> on Hyzaar100-12.5 & Cardura8-1/2; BP=120/72 today & denies CP, palpit, SOB, edema...    ASPVD> on ASA 81mg  daily w/o cerebral ischemic symptoms etc; he is off Plavix ever since his SDH...    Chol> on Simva40-1/2, Fish Oil & diet rx; FLP 8/14 shows TChol 128, TG 74, HDL 48, LDL 66    Hx medullary thyroid cancer> on Synthroid200-taking 1/2 alt w/1 Qod; followed by [facility] for CCS; TFTs remain wnl w/ TSH 8/14 = 0.54 & FreeT4= 1.28 (0.6-1.60)    GI- Divertics, HxPolyps> last colon 2007 by DrSamLeB showed divertics, hems, no polyps & f/u suggested 15yrs but now>82y/o...    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    DJD, LBP> on Vicodin prn; he developed LBP after lifting a mattress 5/13; eval by DrBrooks & DrHirsh w/ MRI reported to show HNP at L4-5 (w/ myelopathy & radiculopathy); DrHirsh did LumbarLam 1/14 w/ decompression L4 & L5 w/ diskectomy=> improved...    Neuro- TIA, Hx subdural hemtoma, Tremor/Parkinson's> now followed by DrAthar for Neuro on Sinemet25-100Tid & Azilect1mg - he reports doing better overall...    Hx skin cancer> followed by Derm; SCCa removed from right  forearm in 2006... We reviewed prob list, meds, xrays and labs> see below for updates >>  LABS 8/14:  FLP- at goals on Simva20;  Chems- ok x Cr=1.5;  CBC- wnl;  TSH=0.54 & FreeT4=1.28 (0.6-1.60)...          Problem List:  HYPERTENSION (ICD-401.9) - controlled on HYZAAR 100/25 daily, & CARDURA 8mg - taking 1/2 daily... ~  2/13:  BP= 122/60 & he denies CP, palpit, SOB, edema, etc... ~  8/13:  BP= 140/80 & he remains largely asymptomatic... ~  CXR 1/14 showed normal heart size, ectatic & calcif Ao, low lying diaph, clear lungs, NAD... ~  EKG 1/14  showed NSR, rate61,  Poor R prog V1-3, NSSTTWA... ~  2/14: on Hyzaar100-12.5 & Cardura8; BP=138/80 today & denies CP, palpit, SOB, edema.  ATHEROSCLEROTIC VASCULAR DISEASE (ICD-440.9) - on ASA 81mg /d;  Prev Plavix was discontinued after his subdural hematoma in 2006. ~  Eval 2003 by [facility] for TIA showed cerebrovasc dis w/ R>L PCA stenoses...  HYPERLIPIDEMIA (ICD-272.4) - on SIMVASTATIN 40mg -taking 1/2 + FISH OIL 1000mg /d> doing well. ~  FLP 12/07 on Zocor showed TChol 137, HDL 47, LDL 70 ~  FLP 7/09 off med showed TChol 196, TG 95, HDL 49, LDL 128... rec> restart Zocor 40mg . ~  FLP 7/10 showed TChol 135, TG 47, HDL 53, LDL 73... He decr dose to 1/2 tab on his own... 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 he is s/p thyroidectomy for medullary carcinoma of the thyroid 6/02 by [facility]... ~  labs 7/09 showed TSH= 3.47 ~  labs 7/10 showed TSH= 2.95 ~  labs 8/11 showed TSH= 1.20 ~  Labs 8/12 showed TSH= 0.23 & pt rec to decr the Synthroid200 to only 1/2 on MWF.Marland Kitchen. ~  Labs 2/13 showed TSH= 2.29 on Synthroid200- taking 1x4d=TThSS and 1/2 x3d=MWF.Marland Kitchen. ~  Labs 8/13 showed TSH= 0.69... On same dose.  COLONIC POLYPS  (ICD-211.3) - last colonoscopy 1/07 by DrSam showed divertics & hems only... f/u planned 30yrs.  BENIGN PROSTATIC HYPERTROPHY, WITH OBSTRUCTION (ICD-600.01) - he sees DrDavis for Urology once a year... he has a brother who had prostate cancer...  ~  PSA followed by ZOXWRUEA yrearly... 7/10 pt reports PSA>5 (was 4.17 here) & Rx w/ Cipro... ~  he had f/u Urology eval DrGrapey 5/11- pt indicates that his PSA was OK... 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 ~  2/14:  followed by DrGrapey & DrYamagata re-imaged the lesion via CT 1/14 w/ growth evident & he has rec Cryoablation=> pending due to need for LLam first... ~  3/14:  he underwent cryoablation of right renal oncocytic tumor by DrYamagata (pt reports painful procedure) ~  5/14:  f/u visit w/ IR & CT Abd=> 2 left renal cysts & lower pole right renal cyst, cryoablation zone in inferior pole right kidney measures 4x3cm; atherosclerotic changes in Ao, mult splenic granulomata, prom CBD...  TRANSIENT ISCHEMIC ATTACK (ICD-435.9) - hx of recurrent right brain TIA's in 2003... initially on ASA + Plavix and the Plavix was stopped after his subdural in 2006...  Hx of SUBDURAL HEMATOMA (ICD-432.1) - hosp 7/06  w/ left subdural hematoma req craniotomy by DrHirsh for evacuation... no known trauma- he was  on ASA/ Plavix and developed a headache... he also take DILANTIN 100mg  Tid now per [facility] (for a partial seizure characterized by aphasia)... ~  7/10: pt tells me he has decreased his Dilantin to Bid and will f/u w/ DrLove. ~  8/11:  we don't have any recent notes from [facility], but pt is off the Dilantin rx.  TREMOR (ICD-781.0) - he notes mild tremor right hand & arm> eval from Neurology- Autumn Patty- indicated prob early [name] and after a period of observation they started Mirapex (stopped due to hypotension), changed to [facility] 2/12> dosed per DrLove. ~  8/11: pt tells me that he has started accupuncture treatments in [place]==> no benefit. ~  2/12:  pt indicates that DrLove stopped the Mirapex in favor of SINEMET 25/100- taking 1/2 Tid. ~  8/12:  Pt tells me he has seen DrScott, Neurology at [facility] (?referred by [facility], ?second opinion), we don't have records, pt reports no change in meds (he considered entering a drug study). ~  He continues to f/u w/ DrLove every 4-13months... ~  8/13:  He indicates that Drlove recently increased the SINEMET 25/100 to 1 tab Tid... ~  8/14:  He had Neuro f/u DrAthar> note reviewed & pt stable on Sinemet Tid + Azilect 1mg /d (added 5/14 by Neuro)...  DEGENERATIVE JOINT DISEASE (ICD-715.90) - he notes some knee discomfort & he takes Aleve & HYDROCODONE as needed... LBP >> injured back lifting mattress 5/13; eval by DrBrooks & DrHirsh w/ abn MRI (done by Ortho at their office) & disc herniation L4-5 by report; Pred w/o help, ESI helped some, using pain Rx & holding off on surg per DrHirsh... ~  1/14: on Vicodin prn; eval by DrBrooks & DrHirsh w/ MRI reported to show HNP at L4-5 (w/ myelopathy & radiculopathy); DrHirsh did LumbarLam 1/14 w/ decompression L4 & L5 w/ discectomy=> improved...  Hx of CARCINOMA, SKIN, SQUAMOUS CELL (ICD-173.9) - s/p removal of skin cancer from his right forearm 11/06 by DrLupton...  Health Maintenance:  he had PNEUMOVAX vaccine in 1998 &  1/11... gets yearly Flu vaccine every fall... TDAP given 8/11...   Past Surgical History  Procedure Laterality Date  . Cataract sugery    . Cholecystectomy    . Right inguinal hernia repair    . Thyroidectomy for medullary carcinoma    . Eye surgery    . Knee arthroscopy    . Appendectomy    . Tonsillectomy    . Lumbar laminectomy/decompression microdiscectomy  05/14/2012    Procedure: LUMBAR LAMINECTOMY/DECOMPRESSION MICRODISCECTOMY 1 LEVEL;  Surgeon: Clydene Fake, MD;  Location: [facility];  Service: Neurosurgery;  Laterality: Left;  Left Lumbar four-five Laminectomy/Diskectomy/Far lateral diskectomy  . Kidney surgery  07-10-12    Outpatient Encounter Prescriptions as of 12/10/2012  Medication Sig Dispense Refill  . aspirin EC 81 MG tablet Take 81 mg by mouth daily.      . carbidopa-levodopa (SINEMET IR) 25-100 MG per tablet Take 1 tablet by mouth 3 (three) times daily.  270 tablet  3  . doxazosin (CARDURA) 8 MG tablet Take 4 mg by mouth at bedtime.      Marland Kitchen levothyroxine (SYNTHROID, LEVOTHROID) 200 MCG tablet Take 100-200 mcg by mouth daily. Alternates daily by taking (1 tablet) once daily and then taking ( tablet) the next day, and so on.      . losartan-hydrochlorothiazide (HYZAAR) 100-25 MG per  tablet Take 1 tablet by mouth daily before breakfast.  90 tablet  3  . Multiple Vitamin (MULTIVITAMIN WITH MINERALS) TABS Take 1 tablet by mouth daily.      . rasagiline (AZILECT) 1 MG TABS tablet Take 1 tablet (1 mg total) by mouth daily.  90 tablet  3  . simvastatin (ZOCOR) 40 MG tablet Take 20 mg by mouth every evening. Takes 1/2 tablet       No facility-administered encounter medications on file as of 12/10/2012.    No Known Allergies   Current Medications, Allergies, Past Medical History, Past Surgical History, Family History, and Social History were reviewed in [employer] record.    Review of Systems       See HPI - all other systems neg  except as noted...       The patient complains of dyspnea on exertion, right hand tremor, and difficulty walking.  The patient denies anorexia, fever, weight loss, weight gain, vision loss, decreased hearing, hoarseness, chest pain, syncope, peripheral edema, prolonged cough, headaches, hemoptysis, abdominal pain, melena, hematochezia, severe indigestion/heartburn, hematuria, incontinence, muscle weakness, suspicious skin lesions, transient blindness, depression, unusual weight change, abnormal bleeding, enlarged lymph nodes, and angioedema.     Objective:   Physical Exam     WD, WN, 79 y/o WM in NAD... GENERAL:  Alert & oriented; pleasant & cooperative... HEENT:  [place]/AT, EOM-wnl, PERRLA, EACs-clear, TMs-wnl, NOSE-clear, THROAT-clear & wnl. NECK:  Supple w/ fairROM; no JVD; normal carotid impulses w/o bruits; thyroid area scar, no nodules palp; no lymphadenopathy. CHEST:  Clear to P & A; without wheezes/ rales/ or rhonchi heard... HEART:  Regular Rhythm; without murmurs/ rubs/ or gallops detected... ABDOMEN:  Soft & nontender; normal bowel sounds; no organomegaly or masses palpated... BACK:  Scar of LLam surg... EXT: without deformities, mild arthritic changes; no varicose veins/ +venous insuffic/ no edema. NEURO:  CN's intact;  mod tremor right hand & arm at rest, ?early mask facies, still moves well. DERM:  No lesions noted; no rash etc...  RADIOLOGY DATA:  Reviewed in the EPIC EMR & discussed w/ the patient...  LABORATORY DATA:  Reviewed in the EPIC EMR & discussed w/ the patient...   Assessment & Plan:    HBP>  Controlled on meds, continue same...  PVD>  On ASA w/o cerebral ischemic symptoms, continue same...  HYPERLIPID>  On Simva20 + Fish Oil & FLPs have been at goal, continue same...  Hx Medullary Thyroid Carcinoma>  S/p surg by DrNewman2002, on Synthroid - alternating 1-1/2 now & TSH is ok on this dose...  GI> remote hx polyp, divertics, hems>  Last colon 2007 by by  DrSam & suggested f/u 47yrs...  GU>  Followed by DrGrapey now, RENAL LESION found incidentally on MRI Lumbar spine & w/u revealed oncocytoma w/ cryoablation per [facility] 3/14...  NEURO> Hx TIA, Hx SDH, Tremor/ Parkinson's>  Followed by Autumn Patty & now DrAthar on Sinemet & Azilect, improved...  DJD, LBP>  He had decompressive LLam 1/14 by DrHirsh & is recovering nicely...  Other medical issues as noted...   Patient's Medications  New Prescriptions   LOSARTAN-HYDROCHLOROTHIAZIDE (HYZAAR) 100-12.5 MG PER TABLET    Take 1 tablet by mouth daily.  Previous Medications   ASPIRIN EC 81 MG TABLET    Take 81 mg by mouth daily.   CARBIDOPA-LEVODOPA (SINEMET IR) 25-100 MG PER TABLET    Take 1 tablet by mouth 3 (three) times daily.   DOXAZOSIN (CARDURA) 8 MG TABLET  Take 4 mg by mouth at bedtime.   LEVOTHYROXINE (SYNTHROID, LEVOTHROID) 200 MCG TABLET    Take 100-200 mcg by mouth daily. Alternates daily by taking (1 tablet) once daily and then taking ( tablet) the next day, and so on.   MULTIPLE VITAMIN (MULTIVITAMIN WITH MINERALS) TABS    Take 1 tablet by mouth daily.   RASAGILINE (AZILECT) 1 MG TABS TABLET    Take 1 tablet (1 mg total) by mouth daily.   SIMVASTATIN (ZOCOR) 40 MG TABLET    Take 20 mg by mouth every evening. Takes 1/2 tablet  Modified Medications   No medications on file  Discontinued Medications   LOSARTAN-HYDROCHLOROTHIAZIDE (HYZAAR) 100-25 MG PER TABLET    Take 1 tablet by mouth daily before breakfast.

## 2012-12-11 ENCOUNTER — Other Ambulatory Visit: Payer: Self-pay | Admitting: Pulmonary Disease

## 2012-12-11 LAB — BASIC METABOLIC PANEL
BUN: 24 mg/dL — ABNORMAL HIGH (ref 6–23)
CO2: 30 mEq/L (ref 19–32)
GFR: 48.29 mL/min — ABNORMAL LOW (ref >60.00)
Glucose, Bld: 85 mg/dL (ref 70–99)
Potassium: 3.7 mEq/L (ref 3.5–5.1)

## 2012-12-11 LAB — HEPATIC FUNCTION PANEL
AST: 20 U/L (ref 0–37)
Albumin: 4.3 g/dL (ref 3.5–5.2)
Total Protein: 6.9 g/dL (ref 6.0–8.3)

## 2012-12-11 LAB — LIPID PANEL
Cholesterol: 128 mg/dL (ref 0–200)
HDL: 47.6 mg/dL (ref >39.00)
VLDL: 14.8 mg/dL (ref 0.0–40.0)

## 2012-12-11 LAB — T4, FREE: Free T4: 1.28 ng/dL (ref 0.60–1.60)

## 2012-12-11 LAB — TSH: TSH: 0.54 u[IU]/mL (ref 0.35–5.50)

## 2012-12-11 MED ORDER — LOSARTAN POTASSIUM-HCTZ 100-12.5 MG PO TABS: 1.0000 | ORAL_TABLET | Freq: Every day | ORAL | Status: DC

## 2012-12-11 NOTE — Telephone Encounter (Signed)
Dose change of the hyzaar.   Pt is aware that this has been sent to the pharmacy.,

## 2012-12-15 ENCOUNTER — Other Ambulatory Visit (HOSPITAL_COMMUNITY): Payer: Self-pay | Admitting: Interventional Radiology

## 2012-12-15 DIAGNOSIS — N2889 Other specified disorders of kidney and ureter: Secondary | ICD-10-CM

## 2012-12-31 ENCOUNTER — Encounter (HOSPITAL_COMMUNITY): Payer: Self-pay

## 2012-12-31 ENCOUNTER — Ambulatory Visit (HOSPITAL_COMMUNITY)
Admission: RE | Admit: 2012-12-31 | Discharge: 2012-12-31 | Disposition: A | Discharge status: Home / Self Care | Payer: Medicare Other | Source: Ambulatory Visit | Attending: Interventional Radiology | Admitting: Interventional Radiology

## 2012-12-31 ENCOUNTER — Ambulatory Visit
Admission: RE | Admit: 2012-12-31 | Discharge: 2012-12-31 | Disposition: A | Discharge status: Home / Self Care | Payer: Medicare Other | Source: Ambulatory Visit | Attending: Interventional Radiology | Admitting: Interventional Radiology

## 2012-12-31 DIAGNOSIS — N2889 Other specified disorders of kidney and ureter: Secondary | ICD-10-CM

## 2012-12-31 DIAGNOSIS — M479 Spondylosis, unspecified: Secondary | ICD-10-CM | POA: Insufficient documentation

## 2012-12-31 DIAGNOSIS — I77811 Abdominal aortic ectasia: Secondary | ICD-10-CM | POA: Insufficient documentation

## 2012-12-31 DIAGNOSIS — D4959 Neoplasm of unspecified behavior of other genitourinary organ: Secondary | ICD-10-CM | POA: Insufficient documentation

## 2012-12-31 DIAGNOSIS — I700 Atherosclerosis of aorta: Secondary | ICD-10-CM | POA: Insufficient documentation

## 2012-12-31 DIAGNOSIS — N281 Cyst of kidney, acquired: Secondary | ICD-10-CM | POA: Insufficient documentation

## 2012-12-31 MED ORDER — IOHEXOL 300 MG/ML  SOLN
100.0000 mL | Freq: Once | INTRAMUSCULAR | Status: AC | PRN
  Administered 2012-12-31: 100 mL via INTRAVENOUS

## 2012-12-31 NOTE — Progress Notes (Signed)
Denies hematuria.  Occasional urinary urgency.  Denies pain associated with cryoablation.    Playing golf 2-3 times/week.  Also, walks 1/2 mile several times/week.  Peyton Spengler [place], RN 12/31/2012 11:11 AM

## 2013-01-01 NOTE — Progress Notes (Signed)
Patient ID: William [name], male   DOB: 1928-09-10, 79 y.o.   MRN: [id number]   ESTABLISHED PATIENT OFFICE VISIT  HISTORY OF PRESENT ILLNESS: William [name] has been doing well. He has experienced no symptoms after treatment. He remains active and is playing golf 2 to 3 times a week.  CHIEF COMPLAINT: Status post percutaneous cryoablation of a right renal oncocytic neoplasm on 07/10/2012.  PHYSICAL EXAMINATION: General: No acute distress. Vital signs: Blood pressure 170/74, pulse 62, respirations 16, temperature 97.6 degrees, oxygen saturation 97% on room air.  Labs: BUN 25, creatinine 1.5 and estimated GFR 48 mL/minute.  Imaging: CT performed earlier today demonstrates expected post ablation changes at the level of the treated right renal oncocytoma. There is no enhancement of tissue to suggest residual or recurrent tumor. No complications are identified.  REVIEW OF SYSTEMS: No fever, chills, hematuria or dysuria.  ASSESSMENT AND PLAN: No evidence of tumor recurrence or complication following right renal cryoablation 6 months ago. I have recommended another followup scan towards the end of March, 2015 at which time the patient will be 1 year post ablation.

## 2013-01-26 ENCOUNTER — Other Ambulatory Visit: Payer: Self-pay | Admitting: Dermatology

## 2013-02-18 ENCOUNTER — Other Ambulatory Visit: Payer: Self-pay

## 2013-02-23 ENCOUNTER — Ambulatory Visit (INDEPENDENT_AMBULATORY_CARE_PROVIDER_SITE_OTHER): Payer: Medicare Other

## 2013-02-23 DIAGNOSIS — Z23 Encounter for immunization: Secondary | ICD-10-CM

## 2013-03-25 ENCOUNTER — Telehealth: Payer: Self-pay | Admitting: Pulmonary Disease

## 2013-03-25 NOTE — Telephone Encounter (Signed)
Pt states that his insurance will be changing at the beginning of 2015. The new insurance will require some paperwork and referrals from SN. States that he will drop the information off to [place] before the end of the year.

## 2013-03-31 NOTE — Progress Notes (Signed)
None

## 2013-04-12 ENCOUNTER — Telehealth: Payer: Self-pay | Admitting: Neurology

## 2013-04-12 ENCOUNTER — Telehealth: Payer: Self-pay | Admitting: Pulmonary Disease

## 2013-04-12 DIAGNOSIS — N138 Other obstructive and reflux uropathy: Secondary | ICD-10-CM

## 2013-04-12 DIAGNOSIS — N401 Enlarged prostate with lower urinary tract symptoms: Secondary | ICD-10-CM

## 2013-04-12 NOTE — Telephone Encounter (Signed)
Patient is having blood pressure issues and is requesting a call back regarding changing meds.

## 2013-04-12 NOTE — Telephone Encounter (Signed)
Pt states that he already has an appt with urology on 04/16/13 but d/t recent insurance change and requirements, they are requesting a referral in order for pt to keep appt.  Referral placed to Urology.  Pt also wanting to know if leigh received a letter that he mailed containing a list of specialist he would like to be referred to? Please advise Leigh if you have seen this .

## 2013-04-12 NOTE — Telephone Encounter (Signed)
Patient is having trouble with his blood pressure on Azilect. He takes bp med, Azilect and thyroid meds in am. He take another bp medication at bedtime bp reading 64/32 to 160/79, this am 110/64, lunch time 144/83. So he wants to know if AIzilect can be adjust or another medication prescribed.

## 2013-04-12 NOTE — Telephone Encounter (Signed)
I spoke with pt. 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 Please advise leigh if you have this? He reports he is needing more than 1 referral. thanks

## 2013-04-12 NOTE — Telephone Encounter (Signed)
Per SN---  Ok to refer to urology but we have no control over when they are able to get the pt in for an appt.

## 2013-04-20 NOTE — Telephone Encounter (Signed)
Please advise patient that asthma can sometimes cause high blood pressure. He should take the Azilect in the morning as prescribed but may have to adjust his blood pressure medication. [name] can affect blood pressure by reducing blood pressure with time and causing low blood pressure values or drop in blood pressure when standing up quickly. Please advise patient to  stay well-hydrated, change positions slowly and discuss with his primary care physician adjustment of his blood pressure medications. He should keep a log of morning and evening blood pressure every other day or so and discuss with his primary care physician adjustment of his blood pressure values based on his log. He has been on Azilect since May of last year and has done well on it. I feel reluctant to take it away.

## 2013-04-20 NOTE — Telephone Encounter (Signed)
I called both numbers and got VM on both (Same Person, William [name]). I left VM that the Azilect can cause BP to drop. Asthma can make BP go up. So take asthma medication as directed. Continue Azilect in a.m. You may need to have your doctor adjust your BP medication. Also, use caution when standing, especially in the a.m., due to BP changes. Please call back with any questions.

## 2013-04-20 NOTE — Telephone Encounter (Signed)
lmomtcb x1 

## 2013-04-20 NOTE — Telephone Encounter (Signed)
lmtcb x1-- no list has been received

## 2013-04-20 NOTE — Telephone Encounter (Signed)
Patient returning call.

## 2013-04-21 ENCOUNTER — Telehealth: Payer: Self-pay | Admitting: Neurology

## 2013-04-21 DIAGNOSIS — G20 Parkinson's disease: Secondary | ICD-10-CM

## 2013-04-21 MED ORDER — DOXAZOSIN MESYLATE 8 MG PO TABS
4.0000 mg | ORAL_TABLET | Freq: Every day | ORAL | Status: DC
Start: 2013-04-21 — End: 2013-04-23

## 2013-04-21 MED ORDER — LEVOTHYROXINE SODIUM 200 MCG PO TABS: ORAL_TABLET | ORAL | Status: DC

## 2013-04-21 MED ORDER — LOSARTAN POTASSIUM-HCTZ 100-12.5 MG PO TABS: 1.0000 | ORAL_TABLET | Freq: Every day | ORAL | Status: DC

## 2013-04-21 MED ORDER — SIMVASTATIN 40 MG PO TABS: 20.0000 mg | ORAL_TABLET | Freq: Every evening | ORAL | Status: DC

## 2013-04-21 NOTE — Telephone Encounter (Signed)
Spoke with pt. Aware we will refill the medications SN fills for him. Nothing further needed

## 2013-04-21 NOTE — Telephone Encounter (Signed)
Dr Rexene Alberts already sent a one year supply of this medication to mail order when the patient was in for an [place] in August.  I can gladly send it again, or send it to the local pharmacy if he is waiting on mail order to arrive.  I called the patient back.  The phone rang about 20 times.  Got no answer, no VM.

## 2013-04-21 NOTE — Telephone Encounter (Signed)
Pt needs 90 day refill sent to [facility] on azilect, levothyrox, simvastatin, losartan, doxazosin, carbidopa, asprin, multi-vitimins, and aleve.  Pt is completely out of azilect. He needs a refill now on [facility] [address].   PT's referral to urology is taken care of.

## 2013-04-21 NOTE — Telephone Encounter (Signed)
Patient calling requesting Azilect refill since he took his last pill today. Patient also requesting carbidopa-levodopa refill. Patient requesting a call first to discuss.

## 2013-04-22 MED ORDER — RASAGILINE MESYLATE 1 MG PO TABS: 1.0000 mg | ORAL_TABLET | Freq: Every day | ORAL | Status: DC

## 2013-04-22 NOTE — Telephone Encounter (Signed)
I spoke with the patient.  He said he is expecting Azilect and Carb/Levo in the mail, but needs a small supply of [facility]ilect called into [facility].  Patient indicated he has enough Carb/Levo and does not need a Rx for it at this time.  Azilect has been sent.

## 2013-04-23 ENCOUNTER — Telehealth: Payer: Self-pay | Admitting: Pulmonary Disease

## 2013-04-23 MED ORDER — DOXAZOSIN MESYLATE 8 MG PO TABS: 4.0000 mg | ORAL_TABLET | Freq: Every day | ORAL | Status: DC

## 2013-04-23 MED ORDER — LEVOTHYROXINE SODIUM 200 MCG PO TABS: ORAL_TABLET | ORAL | Status: DC

## 2013-04-23 MED ORDER — LOSARTAN POTASSIUM-HCTZ 100-12.5 MG PO TABS: 1.0000 | ORAL_TABLET | Freq: Every day | ORAL | Status: DC

## 2013-04-23 MED ORDER — SIMVASTATIN 40 MG PO TABS: 20.0000 mg | ORAL_TABLET | Freq: Every evening | ORAL | Status: DC

## 2013-04-23 NOTE — Telephone Encounter (Signed)
Pt returned triage's call & can be reached at [phone].  Satira Anis

## 2013-04-23 NOTE — Telephone Encounter (Signed)
Called and spoke with pt and he stated that his insurance has changed and he needs his rx sent to [facility] to be refilled.  These meds have been sent in for the pt and he is aware. Nothing further is needed.

## 2013-04-23 NOTE — Telephone Encounter (Signed)
lmomtcb  

## 2013-04-25 ENCOUNTER — Other Ambulatory Visit: Payer: Self-pay

## 2013-04-25 DIAGNOSIS — G20 Parkinson's disease: Secondary | ICD-10-CM

## 2013-04-25 MED ORDER — CARBIDOPA-LEVODOPA 25-100 MG PO TABS: 1.0000 | ORAL_TABLET | Freq: Three times a day (TID) | ORAL | Status: DC

## 2013-04-25 MED ORDER — RASAGILINE MESYLATE 1 MG PO TABS: 1.0000 mg | ORAL_TABLET | Freq: Every day | ORAL | Status: DC

## 2013-06-02 ENCOUNTER — Other Ambulatory Visit (HOSPITAL_COMMUNITY): Payer: Self-pay | Admitting: Interventional Radiology

## 2013-06-02 DIAGNOSIS — N2889 Other specified disorders of kidney and ureter: Secondary | ICD-10-CM

## 2013-06-08 ENCOUNTER — Ambulatory Visit: Payer: Medicare Other | Admitting: Neurology

## 2013-06-11 ENCOUNTER — Telehealth: Payer: Self-pay | Admitting: Pulmonary Disease

## 2013-06-11 NOTE — Telephone Encounter (Signed)
Called and spoke with pt and he is aware of appt with SN on 3-9 at 2:30.  Nothing further is needed.

## 2013-06-11 NOTE — Telephone Encounter (Signed)
Called spoke with pt. He had to cancel his appt for 06/15/13. Needing to r/s but wants appt sooner than next available. Please advise SN thanks

## 2013-06-15 ENCOUNTER — Ambulatory Visit: Payer: Medicare Other | Admitting: Pulmonary Disease

## 2013-06-16 ENCOUNTER — Ambulatory Visit (INDEPENDENT_AMBULATORY_CARE_PROVIDER_SITE_OTHER): Payer: Medicare HMO | Admitting: Pulmonary Disease

## 2013-06-16 ENCOUNTER — Encounter: Payer: Self-pay | Admitting: Pulmonary Disease

## 2013-06-16 VITALS — BP 120/58 | HR 54 | Temp 96.8°F | Ht 76.0 in | Wt 180.8 lb

## 2013-06-16 DIAGNOSIS — N138 Other obstructive and reflux uropathy: Secondary | ICD-10-CM

## 2013-06-16 DIAGNOSIS — K5730 Diverticulosis of large intestine without perforation or abscess without bleeding: Secondary | ICD-10-CM

## 2013-06-16 DIAGNOSIS — I7090 Unspecified atherosclerosis: Secondary | ICD-10-CM

## 2013-06-16 DIAGNOSIS — N401 Enlarged prostate with lower urinary tract symptoms: Secondary | ICD-10-CM

## 2013-06-16 DIAGNOSIS — M1990 Unspecified osteoarthritis, unspecified site: Secondary | ICD-10-CM

## 2013-06-16 DIAGNOSIS — G20 Parkinson's disease: Secondary | ICD-10-CM

## 2013-06-16 DIAGNOSIS — D3000 Benign neoplasm of unspecified kidney: Secondary | ICD-10-CM

## 2013-06-16 DIAGNOSIS — E785 Hyperlipidemia, unspecified: Secondary | ICD-10-CM

## 2013-06-16 DIAGNOSIS — C73 Malignant neoplasm of thyroid gland: Secondary | ICD-10-CM

## 2013-06-16 DIAGNOSIS — E890 Postprocedural hypothyroidism: Secondary | ICD-10-CM

## 2013-06-16 DIAGNOSIS — G20A1 Parkinson's disease without dyskinesia, without mention of fluctuations: Secondary | ICD-10-CM

## 2013-06-16 DIAGNOSIS — I10 Essential (primary) hypertension: Secondary | ICD-10-CM

## 2013-06-16 MED ORDER — LEVOTHYROXINE SODIUM 150 MCG PO TABS: 150.0000 ug | ORAL_TABLET | Freq: Every day | ORAL | Status: DC

## 2013-06-16 NOTE — Patient Instructions (Signed)
Today we updated your med list in our EPIC system...    Continue your current medications the same...    We decided to change your Synthroid to the 1108mcg pill- take one tab every day...  We wrote for the Shingles vaccine which you can get at [facility] or [facility]...  Call for any questions...  Let's plan a follow up visit in 80mo, sooner if needed for problems.Marland KitchenMarland Kitchen

## 2013-06-16 NOTE — Progress Notes (Signed)
Subjective:    Patient ID: William [name], male    DOB: [birth year], 80 y.o.   MRN: [id number]  HPI 80 y/o WM here for a follow up visit... he has multiple medical problems as noted below... followed for HBP, Carotid vasc dis w/ hx of TIA & prev subdural hematoma, Chol, & prev surg for medullary thyroid carcinoma... his wife passed away from metastatic breast cancer 11/09...  ~  June 06, 2011:  43mo ROV & William [name] states he is doing satis, w/o new complaints or concerns... He had a left lower lid ectropion repair 12/12 by DrZaldivar in [place], [name].    BP remains controlled on meds; BP= 122/60 today & he remains asymptomatic w/o CP, palpit, SOB, edema, etc...    He remains on ASA81 & denies any cerebral ischemic symptoms; he has f/u appt soon w/ DrLove for f/u Parkinson's dis (on Sinemet)...    Chol looks good on Simva40 (see below)...     Hx medullary thyroid cancer, followed by DrDNewman, TSH today = 2.29 on Synthroid255mcg- 1x4d=TThSS & 1/2 x3d=MWF... We reviewed his problem list, meds, XRays & Labs today...  ~  December 05, 2011:  44mo ROV & William [name] relates lifting a mattress several months ago w/ back pain> eval by Lacie Scotts, DrBrooks w/ MRI showing bulging discs at L4-5 & treated w/ Pred w/o benefit, then ESI and this helped some but he has persistent left leg symptoms (on Hydrocodone & Aleve) and can't play golf yet; he had second opinion DrHirsh for [employer] they are waiting on surg;  Incidental finding on the MRI was a renal lesion ==> referred to DrGrapey & they have consulted DrYamagata, IR for cyst drainage and bx of lesion (sched for 9/13- may need cryoablation if it's a cancer)...    BP remains controlled on Hyzaar & Cardura;  Chol remains at goals on Simva20;  Thyroid unchanged & stable on Synthroid Rx;  DrLove manages his Parkinson's & recently increased his Sinemet...    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  ~  June 09, 2012:  9mo ROV & William [name] is here w/ his new girlfriend, William [name]; he had back surg by DrHirsh 3 wks ago & is much improved he says; he notes a sinking feeling x2 since surg- both episodes occurred after eating  & while he was under a warm vent (he will avoid the vent, and observe for any recurrent episodes);  We reviewed the following medical problems during today's office visit >>     HBP> on Hyzaar100-12.5 & Cardura8; BP=138/80 today & denies CP, palpit, SOB, edema...    ASPVD> on ASA 81mg  daily w/o cerebral ischemic symptoms etc; he is off Plavix ever since his SDH...    Chol> on Simva20, Fish Oil & diet rx; last FLP 8/13 showed TChol 137, TG 73, HDL 53, LDL 70    Hx medullary thyroid cancer> on Synthroid200-taking 1/2 alt w/1 Qod; followed by [facility] for CCS; TFTs remain wnl w/ TSH 8/13 = 0.69     GI-  Divertics, HxPolyps> last colon 2007 by DrSamLeB showed divertics, hems, no polyps & f/u suggested 46yrs but now>82y/o...    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    DJD, LBP> on Vicodin prn; he developed LBP after lifting a mattress 5/13; eval by DrBrooks & DrHirsh w/ MRI reported to show HNP at L4-5 (w/ myelopathy & radiculopathy); DrHirsh did LumbarLam 1/14 w/ decompression L4 & L5 w/ discectomy=> improved...    Neuro- TIA, Hx subdural hemtoma, Tremor/Parkinson's> Still followed by William [name] for Neuro (& he reports consult at [facility] from DrScott- we do not have notes  from them) on Sinemet 25-100 Tid; "I'm doing exercises"    Hx skin cancer> followed by Derm; SCCa removed from right forearm in 2006... We reviewed prob list, meds, xrays and labs> see below for updates >> he had the 2013 Flu vaccine 10/13, up to date on others... LABS 1/14 preop in EPIC> Chems- ok & Cr=1.3-1.5;  CBC- ok  ~  December 10, 2012:  60mo ROV & William [name] indicates a good interval w/o new complaints or concerns;  He had his right renal mass ablated by [facility] 3/14 & he reports that was pretty painful but he has recovered & is back to playing golf now;  We reviewed the following medical problems during today's office visit >>     HBP> on Hyzaar100-12.5 & Cardura8-1/2; BP=120/72 today & denies CP, palpit, SOB, edema...    ASPVD> on ASA 81mg  daily w/o cerebral ischemic symptoms etc; he is off Plavix ever since his SDH...    Chol> on Simva40-1/2, Fish Oil & diet rx; FLP 8/14 shows TChol 128, TG 74, HDL 48, LDL 66    Hx medullary thyroid cancer> on Synthroid200-taking 1/2 alt w/1 Qod; followed by [facility] for CCS; TFTs remain wnl w/ TSH 8/14 = 0.54 & FreeT4= 1.28 (0.6-1.60)    GI- Divertics, HxPolyps> last colon 2007 by DrSamLeB showed divertics, hems, no polyps & f/u suggested 68yrs but now>82y/o...    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    DJD, LBP> on Vicodin prn; he developed LBP after lifting a mattress 5/13; eval by DrBrooks & DrHirsh w/ MRI reported to show HNP at L4-5 (w/ myelopathy & radiculopathy); DrHirsh did LumbarLam 1/14 w/ decompression L4 & L5 w/ diskectomy=> improved...    Neuro- TIA, Hx subdural hemtoma, Tremor/Parkinson's> now followed by DrAthar for Neuro on Sinemet25-100Tid & Azilect1mg - he reports doing better overall...    Hx skin cancer> followed by Derm; SCCa removed from right  forearm in 2006... We reviewed prob list, meds, xrays and labs> see below for updates >>   LABS 8/14:  FLP- at goals on Simva20;  Chems- ok x Cr=1.5;  CBC- wnl;  TSH=0.54 & FreeT4=1.28 (0.6-1.60)...  ~  June 16, 2013:  68mo ROV & William [name] describes some stress w/ girlfriends LBP/ [id number] complications... No new complaints or concerns... We reviewed the following medical problems during today's office visit >>     HBP> on Hyzaar100-12.5 & Cardura8-1/2; BP=120/58 today & denies CP, palpit, SOB, edema...    ASPVD> on ASA 81mg  daily w/o cerebral ischemic symptoms etc; he is off Plavix ever since his  SDH...    Chol> on Simva40-1/2, Fish Oil & diet rx; FLP 8/14 shows TChol 128, TG 74, HDL 48, LDL 66    Hx medullary thyroid cancer> on Synthroid200-taking 1/2 alt w/1 Qod; followed by [facility] for CCS; TFTs remain wnl w/ TSH 8/14 = 0.54 & FreeT4= 1.28 (0.6-1.60)    GI- Divertics, HxPolyps> last colon 2007 by DrSamLeB showed divertics, hems, no polyps & f/u suggested 50yrs but now>82y/o...    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    DJD, LBP> on Vicodin prn; he developed LBP after lifting a mattress 5/13; eval by DrBrooks & DrHirsh w/ MRI reported to show HNP at L4-5 (w/ myelopathy & radiculopathy); DrHirsh did LumbarLam 1/14 w/ decompression L4 & L5 w/ diskectomy=> improved...    Neuro- TIA, Hx subdural hemtoma, Tremor/Parkinson's> now followed by DrAthar for Neuro on Sinemet25-100Tid & Azilect1mg - he reports doing better overall...    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 We reviewed prob list, meds, xrays and labs> see below for updates >> Rx for shingles vax...           Problem List:  HYPERTENSION (ICD-401.9) - controlled on HYZAAR 100/25 daily, & CARDURA 8mg -  taking 1/2 daily... ~  2/13:  BP= 122/60 & he denies CP, palpit, SOB, edema, etc... ~  8/13:  BP= 140/80 & he remains largely asymptomatic... ~  CXR 1/14 showed normal heart size, ectatic & calcif Ao, low lying diaph, clear lungs, NAD... ~  EKG 1/14 showed NSR, rate61,  Poor R prog V1-3, NSSTTWA... ~  2/14: on Hyzaar100-12.5 & Cardura8; BP=138/80 today & denies CP, palpit, SOB, edema. ~  3/15: on Hyzaar100-12.5 & Cardura8-1/2; BP=120/58 today & he remains asymptomatic...  ATHEROSCLEROTIC VASCULAR DISEASE (ICD-440.9) - on ASA 81mg /d;  Prev Plavix was discontinued after his subdural hematoma in 2006. ~  Eval 2003 by [facility] for TIA showed cerebrovasc dis w/ R>L PCA stenoses...  HYPERLIPIDEMIA (ICD-272.4) - on SIMVASTATIN 40mg -taking 1/2 + FISH OIL 1000mg /d> doing well. ~  FLP 12/07 on Zocor showed TChol 137, HDL 47, LDL 70 ~  FLP 7/09 off med showed TChol 196, TG 95, HDL 49, LDL 128... rec> restart Zocor 40mg . ~  FLP 7/10 showed TChol 135, TG 47, HDL 53, LDL 73... He decr dose to 1/2 tab on his own... 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 he is s/p thyroidectomy for medullary carcinoma of the thyroid 6/02 by [facility]... ~  labs 7/09 showed TSH= 3.47 ~  labs 7/10 showed TSH= 2.95 ~  labs 8/11 showed TSH= 1.20 ~  Labs 8/12 showed TSH= 0.23 & pt rec to decr the Synthroid200 to only 1/2 on MWF.Marland Kitchen. ~  Labs 2/13 showed TSH= 2.29 on Synthroid200- taking 1x4d=TThSS and 1/2 x3d=MWF.Marland Kitchen. ~  Labs 8/13 showed TSH= 0.69... On same dose. ~  Labs 8/14 on Synthroid200-taking 1  alt w/ 1/2 qod showed TSH=0.54, Free T4=1.28 ~  3/15: we discussed change in Synthroid to 152mcg/d...  COLONIC POLYPS  (ICD-211.3) - last colonoscopy 1/07 by DrSam showed divertics & hems only... f/u planned 46yrs.  BENIGN PROSTATIC HYPERTROPHY, WITH OBSTRUCTION (ICD-600.01) - he sees DrDavis for Urology once a year... he has a brother who had prostate cancer...  ~  PSA followed by PQ:[id number] yrearly... 7/10 pt reports PSA>5 (was 4.17 here) & Rx w/ Cipro... ~  he had f/u Urology eval DrGrapey 5/11- pt indicates that his PSA was OK... 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 ~  2/14:  followed by DrGrapey & DrYamagata re-imaged the lesion via CT 1/14 w/ growth evident & he has rec Cryoablation=> pending due to need for LLam first... ~  3/14:  he underwent cryoablation of right renal oncocytic tumor by DrYamagata (pt reports painful procedure) ~  5/14:  f/u visit w/ IR & CT Abd=> 2 left renal cysts & lower pole right renal cyst, cryoablation zone in inferior pole right kidney measures 4x3cm; atherosclerotic changes in Ao, mult splenic granulomata, prom CBD... ~  9/14: f/u visit w/ IR, DrYamagata> s/p percut cryoablation of right renal oncocytic tumor 3/14; doing well, CTshowed expected post ablation changes, no resid or recurrent tumor suspected...  ~  1/15: f/u visit w/ DrGrapey> doing well from the renal tumor standpoint, BPH, LTOS, ED, etc...  TRANSIENT ISCHEMIC  ATTACK (ICD-435.9) - hx of recurrent right brain TIA's in 2003... initially on ASA + Plavix and the Plavix was stopped after his subdural in 2006...  Hx of SUBDURAL HEMATOMA (ICD-432.1) - hosp 7/06 w/ left subdural hematoma req craniotomy by DrHirsh for evacuation... no known trauma- he was on ASA/ Plavix and developed a headache... he also take DILANTIN 100mg  Tid now per [facility] (for a partial seizure characterized by aphasia)... ~  7/10: pt tells me he has decreased his Dilantin to Bid and will f/u w/ DrLove. ~  8/11:  we don't have any recent notes from [facility], but pt is off the Dilantin rx.  TREMOR (ICD-781.0) - he notes mild tremor right hand & arm> eval from Neurology- William [name]- indicated prob early [name] and after a period of observation they started Mirapex (stopped due to hypotension), changed to [facility] 2/12> dosed per DrLove. ~  8/11: pt tells me that he has started accupuncture treatments in [place]==> no benefit. ~  2/12:  pt indicates that DrLove stopped the Mirapex in favor of SINEMET 25/100- taking 1/2 Tid. ~  8/12:  Pt tells me he has seen DrScott, Neurology at [facility] (?referred by [facility], ?second opinion), we don't have records, pt reports no change in meds (he considered entering a drug study). ~  He continues to f/u w/ DrLove every 4-53months... ~  8/13:  He indicates that Drlove recently increased the SINEMET 25/100 to 1 tab Tid... ~  8/14:  He had Neuro f/u DrAthar> note reviewed & pt stable on Sinemet Tid + Azilect 1mg /d (added 5/14 by Neuro)...  DEGENERATIVE JOINT DISEASE (ICD-715.90) - he notes some knee discomfort & he takes Aleve & HYDROCODONE as needed... LBP >> injured back lifting mattress 5/13; eval by DrBrooks & DrHirsh w/ abn MRI (done by Ortho at their office) & disc herniation L4-5 by report; Pred w/o help, ESI helped some, using pain Rx & holding off on surg per DrHirsh... ~  1/14: on Vicodin prn; eval by DrBrooks & DrHirsh w/ MRI reported to show HNP at L4-5  (w/ myelopathy & radiculopathy); DrHirsh did LumbarLam 1/14 w/ decompression L4 & L5 w/ discectomy=> improved...  Hx of CARCINOMA, SKIN, SQUAMOUS CELL (ICD-173.9) - s/p removal of skin cancer from his right forearm 11/06 by DrLupton...  Health Maintenance:  he had PNEUMOVAX vaccine in 1998 & 1/11... gets yearly Flu vaccine every fall... TDAP given 8/11...   Past Surgical History  Procedure Laterality Date  . Cataract sugery    . Cholecystectomy    . Right inguinal hernia repair    . Thyroidectomy for medullary carcinoma    . Eye surgery    . Knee arthroscopy    . Appendectomy    . Tonsillectomy    . Lumbar laminectomy/decompression microdiscectomy  05/14/2012    Procedure: LUMBAR LAMINECTOMY/DECOMPRESSION MICRODISCECTOMY 1 LEVEL;  Surgeon: [name], MD;  Location: [facility];  Service: Neurosurgery;  Laterality: Left;  Left Lumbar four-five Laminectomy/Diskectomy/Far lateral diskectomy  . Kidney surgery  07-10-12    Outpatient Encounter Prescriptions as of 06/16/2013  Medication Sig  . aspirin EC 81 MG tablet Take 81 mg by mouth daily.  . carbidopa-levodopa (SINEMET IR) 25-100 MG per tablet Take 1 tablet by mouth 3 (three) times daily.  Marland Kitchen doxazosin (CARDURA) 8 MG tablet Take 0.5 tablets (4 mg total) by mouth at bedtime.  Marland Kitchen levothyroxine (SYNTHROID, LEVOTHROID) 200 MCG tablet Pt alternates 1/2 tablet daily then takes 1 tablet the next day  . losartan-hydrochlorothiazide (HYZAAR) 100-12.5 MG per tablet Take 1 tablet by mouth daily.  . Multiple Vitamin (MULTIVITAMIN WITH MINERALS) TABS Take 1 tablet by mouth daily.  . rasagiline (AZILECT) 1 MG TABS tablet Take 1 tablet (1 mg total) by mouth daily.  . simvastatin (ZOCOR) 40 MG tablet Take 20 mg by mouth every evening.  . [DISCONTINUED] simvastatin (ZOCOR) 40 MG tablet Take 0.5 tablets (20 mg total) by mouth every evening. Takes 1/2 tablet    No Known Allergies   Current Medications, Allergies, Past Medical History, Past Surgical  History, Family History, and Social History were reviewed in [employer] record.    Review of Systems       See HPI - all other systems neg except as noted...       The patient complains of dyspnea on exertion, right hand tremor, and difficulty walking.  The patient denies anorexia, fever, weight loss, weight gain, vision loss, decreased hearing, hoarseness, chest pain, syncope, peripheral edema, prolonged cough, headaches, hemoptysis, abdominal pain, melena, hematochezia, severe indigestion/heartburn, hematuria, incontinence, muscle weakness, suspicious skin lesions, transient blindness, depression, unusual weight change, abnormal bleeding, enlarged lymph nodes, and angioedema.     Objective:   Physical Exam     WD, WN, 80 y/o WM in NAD... GENERAL:  Alert & oriented; pleasant & cooperative... HEENT:  [place]/AT, EOM-wnl, PERRLA, EACs-clear, TMs-wnl, NOSE-clear, THROAT-clear &  wnl. NECK:  Supple w/ fairROM; no JVD; normal carotid impulses w/o bruits; thyroid area scar, no nodules palp; no lymphadenopathy. CHEST:  Clear to P & A; without wheezes/ rales/ or rhonchi heard... HEART:  Regular Rhythm; without murmurs/ rubs/ or gallops detected... ABDOMEN:  Soft & nontender; normal bowel sounds; no organomegaly or masses palpated... BACK:  Scar of LLam surg... EXT: without deformities, mild arthritic changes; no varicose veins/ +venous insuffic/ no edema. NEURO:  CN's intact;  mod tremor right hand & arm at rest, ?early mask facies, still moves well. DERM:  No lesions noted; no rash etc...  RADIOLOGY DATA:  Reviewed in the EPIC EMR & discussed w/ the patient...  LABORATORY DATA:  Reviewed in the EPIC EMR & discussed w/ the patient...   Assessment & Plan:    HBP>  Controlled on meds, continue same...  PVD>  On ASA w/o cerebral ischemic symptoms, continue same...  HYPERLIPID>  On Simva20 + Fish Oil & FLPs have been at goal, continue same...  Hx Medullary Thyroid  Carcinoma>  S/p surg by DrNewman2002, on Synthroid 261mcg- alternating 1-1/2 now & TSH is ok on this dose...  GI> remote hx polyp, divertics, hems>  Last colon 2007 by by DrSam & suggested f/u 53yrs...  GU>  Followed by DrGrapey now, RENAL LESION found incidentally on MRI Lumbar spine & w/u revealed oncocytoma w/ cryoablation per [facility] 3/14...  NEURO> Hx TIA, Hx SDH, Tremor/ Parkinson's>  Followed by William [name] & now DrAthar on Sinemet & Azilect, improved...  DJD, LBP>  He had decompressive LLam 1/14 by DrHirsh & is recovering nicely...  Other medical issues as noted...   Patient's Medications  New Prescriptions   LEVOTHYROXINE (SYNTHROID) 150 MCG TABLET    Take 1 tablet (150 mcg total) by mouth daily before breakfast.  Previous Medications   ASPIRIN EC 81 MG TABLET    Take 81 mg by mouth daily.   DOXAZOSIN (CARDURA) 8 MG TABLET    Take 0.5 tablets (4 mg total) by mouth at bedtime.   LOSARTAN-HYDROCHLOROTHIAZIDE (HYZAAR) 100-12.5 MG PER TABLET    Take 1 tablet by mouth daily.   MULTIPLE VITAMIN (MULTIVITAMIN WITH MINERALS) TABS    Take 1 tablet by mouth daily.  Modified Medications   Modified Medication Previous Medication   CARBIDOPA-LEVODOPA (SINEMET IR) 25-100 MG PER TABLET carbidopa-levodopa (SINEMET IR) 25-100 MG per tablet      Take 1 tablet by mouth 4 (four) times daily. At 8, 12, 5 PM, and 9 or 10 PM    Take 1 tablet by mouth 3 (three) times daily.   RASAGILINE (AZILECT) 1 MG TABS TABLET rasagiline (AZILECT) 1 MG TABS tablet      Take 1 tablet (1 mg total) by mouth daily.    Take 1 tablet (1 mg total) by mouth daily.   SIMVASTATIN (ZOCOR) 40 MG TABLET simvastatin (ZOCOR) 40 MG tablet      Take 20 mg by mouth every evening.    Take 0.5 tablets (20 mg total) by mouth every evening. Takes 1/2 tablet  Discontinued Medications   LEVOTHYROXINE (SYNTHROID, LEVOTHROID) 200 MCG TABLET    Pt alternates 1/2 tablet daily then takes 1 tablet the next day

## 2013-06-21 ENCOUNTER — Ambulatory Visit: Payer: Medicare Other | Admitting: Pulmonary Disease

## 2013-06-23 ENCOUNTER — Other Ambulatory Visit: Payer: Self-pay | Admitting: Radiology

## 2013-06-23 DIAGNOSIS — N2889 Other specified disorders of kidney and ureter: Secondary | ICD-10-CM

## 2013-07-08 LAB — CREATININE WITH EST GFR
CREATININE: 1.21 mg/dL (ref 0.50–1.35)
GFR, EST AFRICAN AMERICAN: 64 mL/min
GFR, Est Non African American: 55 mL/min — ABNORMAL LOW

## 2013-07-08 LAB — BUN: BUN: 17 mg/dL (ref 6–23)

## 2013-07-14 ENCOUNTER — Ambulatory Visit (HOSPITAL_COMMUNITY)
Admission: RE | Admit: 2013-07-14 | Discharge: 2013-07-14 | Disposition: A | Discharge status: Home / Self Care | Payer: Medicare HMO | Source: Ambulatory Visit | Attending: Interventional Radiology | Admitting: Interventional Radiology

## 2013-07-14 ENCOUNTER — Encounter (HOSPITAL_COMMUNITY): Payer: Self-pay

## 2013-07-14 ENCOUNTER — Telehealth: Payer: Self-pay | Admitting: Pulmonary Disease

## 2013-07-14 ENCOUNTER — Ambulatory Visit
Admission: RE | Admit: 2013-07-14 | Discharge: 2013-07-14 | Disposition: A | Discharge status: Home / Self Care | Payer: Commercial Managed Care - HMO | Source: Ambulatory Visit | Attending: Interventional Radiology | Admitting: Interventional Radiology

## 2013-07-14 DIAGNOSIS — K449 Diaphragmatic hernia without obstruction or gangrene: Secondary | ICD-10-CM | POA: Diagnosis not present

## 2013-07-14 DIAGNOSIS — I719 Aortic aneurysm of unspecified site, without rupture: Secondary | ICD-10-CM | POA: Insufficient documentation

## 2013-07-14 DIAGNOSIS — N281 Cyst of kidney, acquired: Secondary | ICD-10-CM | POA: Insufficient documentation

## 2013-07-14 DIAGNOSIS — N135 Crossing vessel and stricture of ureter without hydronephrosis: Secondary | ICD-10-CM | POA: Diagnosis not present

## 2013-07-14 DIAGNOSIS — N289 Disorder of kidney and ureter, unspecified: Secondary | ICD-10-CM | POA: Diagnosis present

## 2013-07-14 DIAGNOSIS — D3000 Benign neoplasm of unspecified kidney: Secondary | ICD-10-CM

## 2013-07-14 DIAGNOSIS — K838 Other specified diseases of biliary tract: Secondary | ICD-10-CM | POA: Diagnosis not present

## 2013-07-14 DIAGNOSIS — N2889 Other specified disorders of kidney and ureter: Secondary | ICD-10-CM

## 2013-07-14 MED ORDER — IOHEXOL 300 MG/ML  SOLN
100.0000 mL | Freq: Once | INTRAMUSCULAR | Status: AC | PRN
  Administered 2013-07-14: 100 mL via INTRAVENOUS

## 2013-07-14 NOTE — Telephone Encounter (Signed)
Per SN---  Per SN---  Ok to send in referral for the pt.  thanks

## 2013-07-14 NOTE — Telephone Encounter (Signed)
Referral placed. [place] Bing, CMA

## 2013-07-14 NOTE — Telephone Encounter (Signed)
called # listed for tammy and LMTCB x1

## 2013-07-14 NOTE — Telephone Encounter (Signed)
Spoke with Tammy at Dr Kathlene Cote.  There are seeing pt today for yearly f/u on renal cryoabalation.  Pt told her that since he changed to [facility] at first of year he would need referral for this appt through Dr Lenna Gilford since he is his PCP.  I explained to Tammy that Dr Lenna Gilford is no longer doing primary care.  She is not sure where to proceed from here.  Please advise.    (Pt still has appt with Dr Lenna Gilford in epic for 12/2013 )

## 2013-08-03 IMAGING — CT CT ABDOMEN WO/W CM
3 of 10 series · 12 of 46 positions shown, 18 images · IV contrast (omnipaque)
Comparison: CT from 04/16/2012

CLINICAL DATA: Follow up cryo ablation of renal neoplasm.

CT ABDOMEN WITHOUT AND WITH CONTRAST
TECHNIQUE: Multidetector CT imaging of the abdomen was performed
following the standard protocol before and during bolus
administration of intravenous contrast.
Contrast: 75mL OMNIPAQUE IOHEXOL 300 MG/ML  SOLN

[Series 3: renal arterial · axial · arterial · 0.70mm/px · z∈[-328,-250]mm · 3 of 93 slices shown]
[im 14/93  soft-tissue]
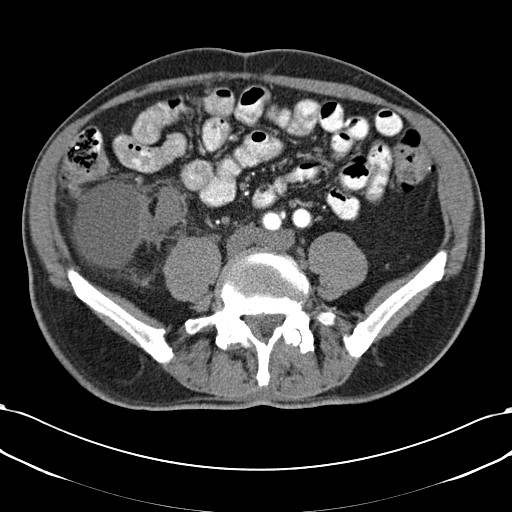
[im 27/93  soft-tissue]
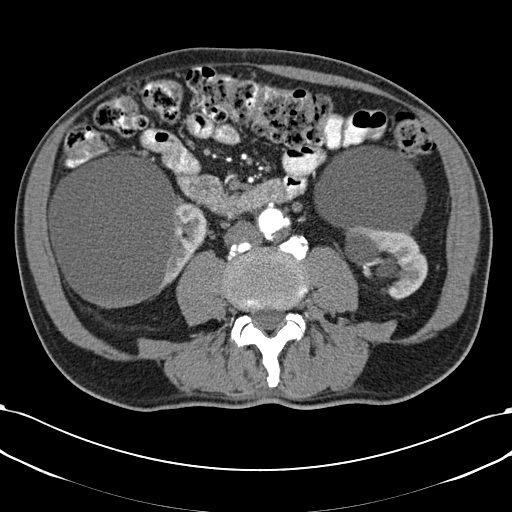
[im 40/93  soft-tissue]
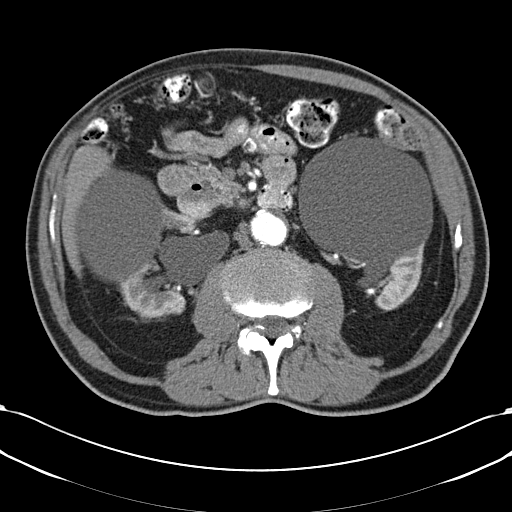

[Series 5: venous · axial · portal-venous · 0.70mm/px · z∈[-328,-134]mm · 6 of 93 slices shown, 11 images]
[im 14/93  soft-tissue]
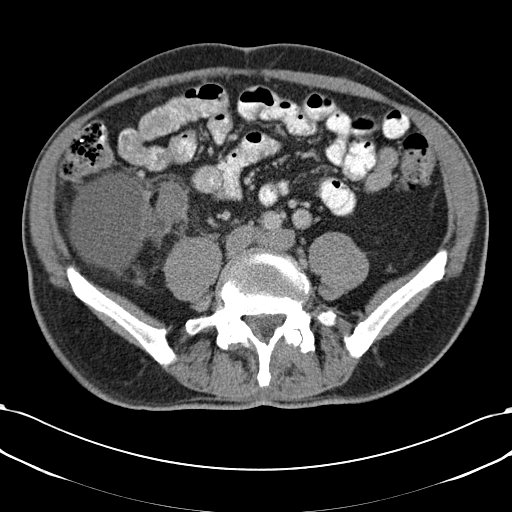
[im 14/93  bone]
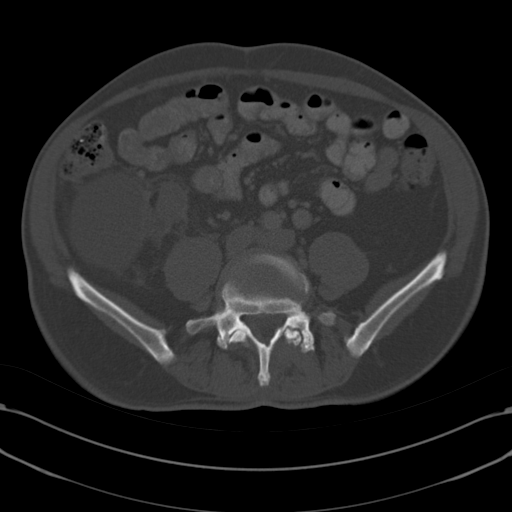
[im 27/93  soft-tissue]
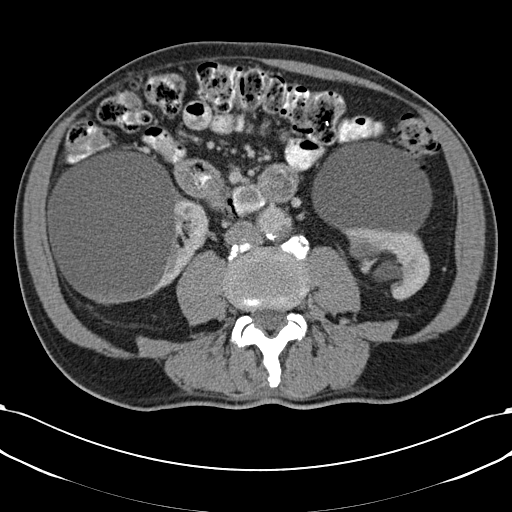
[im 40/93  soft-tissue]
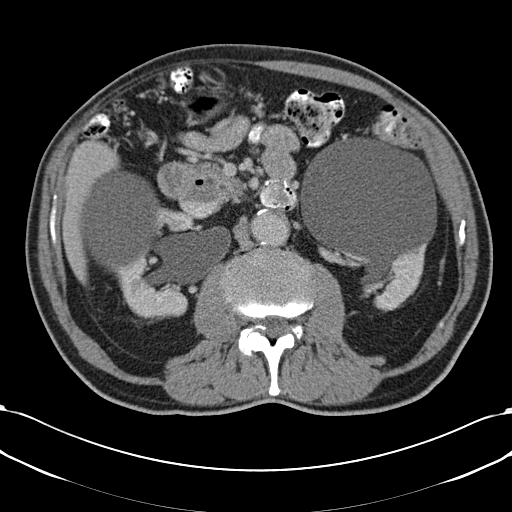
[im 40/93  lung]
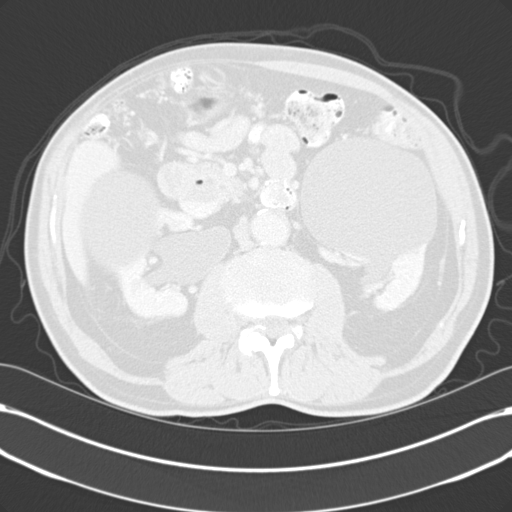
[im 53/93  soft-tissue]
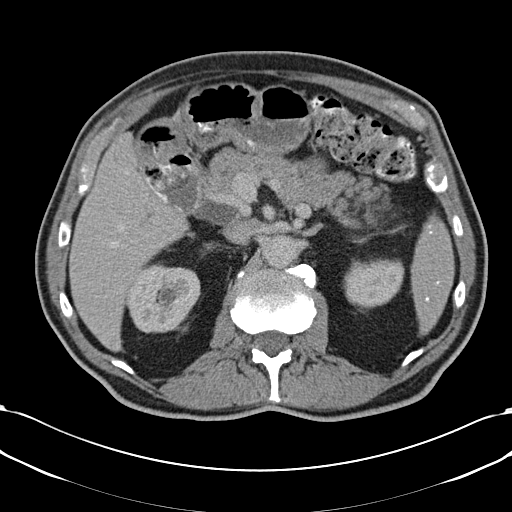
[im 53/93  lung]
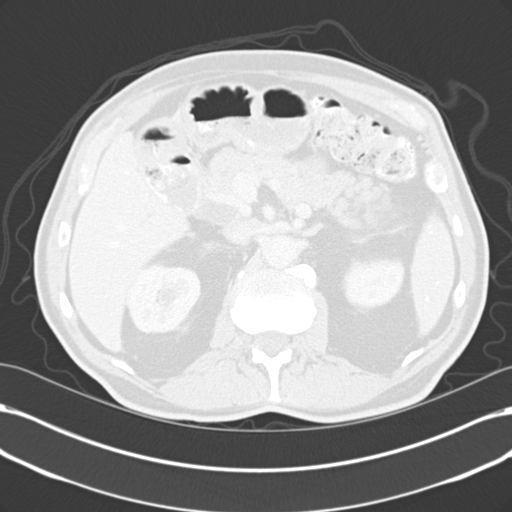
[im 66/93  soft-tissue]
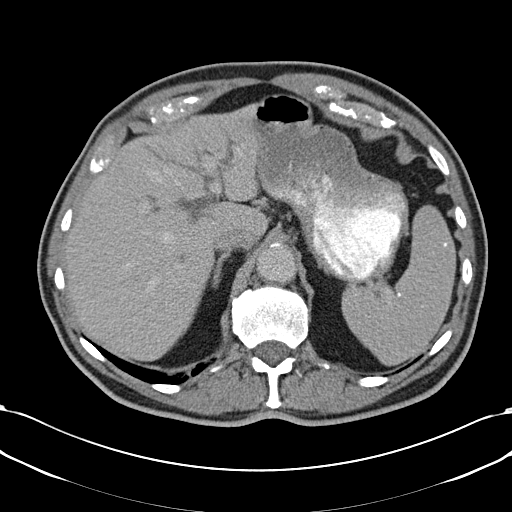
[im 66/93  lung]
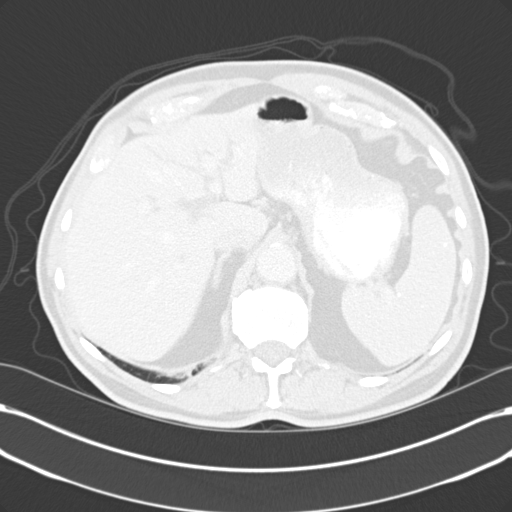
[im 79/93  soft-tissue]
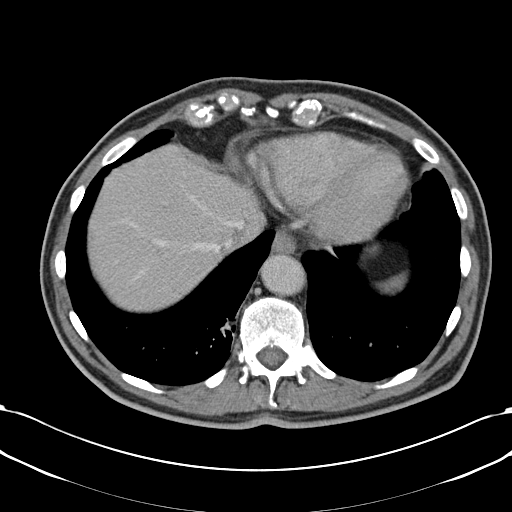
[im 79/93  lung]
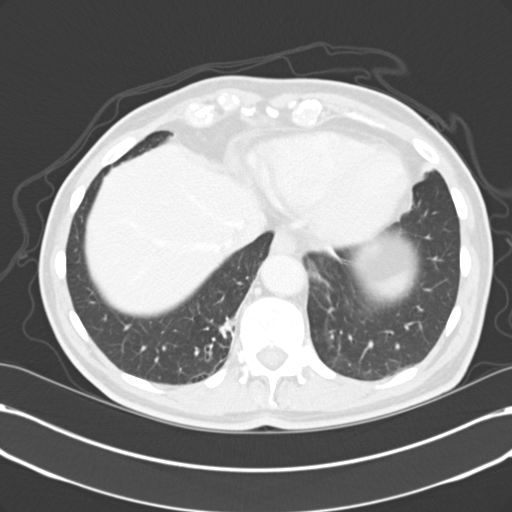

[Series 602: <mpr thick range> · coronal · 0.70mm/px · 3 of 81 slices shown, 4 images]
[im 21/81  soft-tissue]
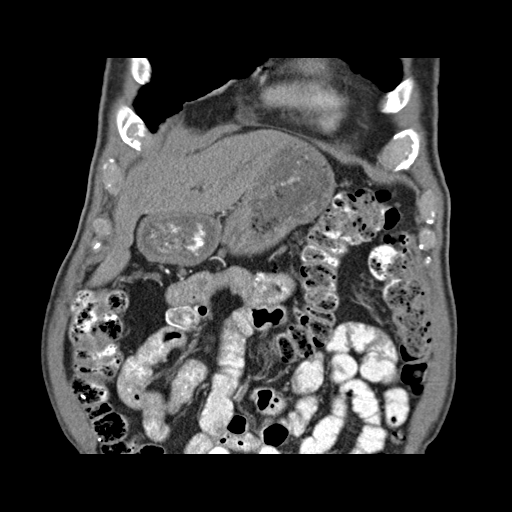
[im 41/81  soft-tissue]
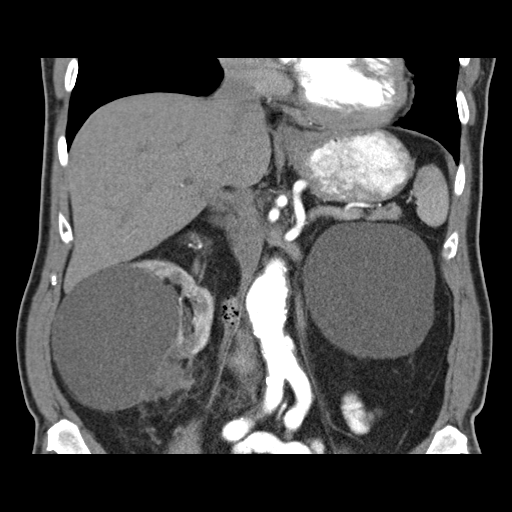
[im 41/81  bone]
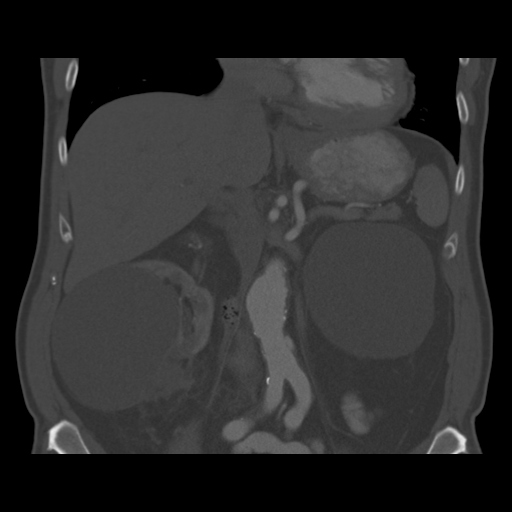
[im 61/81  soft-tissue]
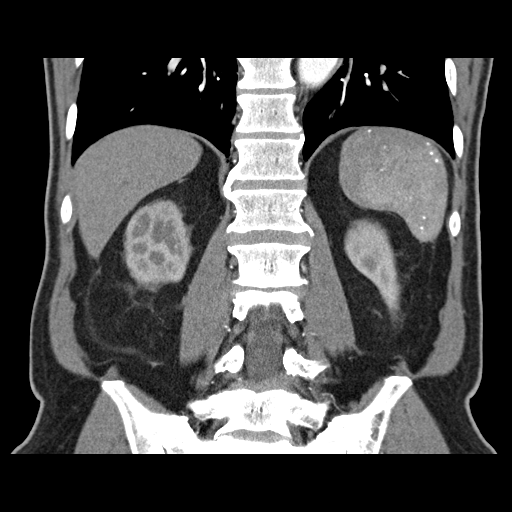

[12 of 46 positions shown; findings below may reference images not displayed]

FINDINGS: Lung bases: The lung bases are clear.  There is no
pericardial or pleural effusion.

Kidneys/Ureters/Bladder: The adrenal glands are both normal.  There
are two left renal cysts.  The largest measures 9.3 cm, image
58/series 5.  This is unchanged from previous exam.  There is a
large cyst arising from the lower pole of the right kidney.  This
is increased in size from previous exam and now measures 10 cm in
AP dimension, image 61/series 5.  Previously this measured 5.9 cm.
The cryoablation zone within the inferior pole of the left kidney
is identified and measures 4 x 3 cm, image 47/series 6. There is no
abnormal enhancement associated with the cryoablation zone.   No
suspicious fluid collections are identified.  On the postcontrast
images and on the delayed images there is no abnormal contrast
extravasation.  The renal vein appears patent.  Prominent right-
sided extrarenal pelvis noted.

Other:  There is no focal liver abnormalities identified.  There is
mild intrahepatic biliary dilatation.  This appears slightly more
prominent than on the previous exam.  The common bile duct measures
1.7 cm, image 37/series 604.  This is unchanged from previous exam.
Duodenal diverticulum is again noted.  The pancreas appears normal.
Multiple calcified splenic granulomas identified. Calcified
atherosclerotic change involves the abdominal aorta.  There is mild
ectasia of the infrarenal abdominal aorta which measures up to
cm in AP dimension, image 60/series 5.  There is no upper abdominal
adenopathy identified.

Bones/Musculoskeletal:  Review of the visualized osseous structures
shows multilevel degenerative disc disease.  No aggressive lytic or
sclerotic bone lesions.
IMPRESSION: 1.  No acute findings.
2.  Expected changes associated with cryo ablation of right renal
tumor.  No complicating features identified.
3.  No abnormal enhancement noted within the cryo ablation as to
suggest residual tumor.
4.  Persistent dilatation of the common bile duct.

## 2013-09-08 ENCOUNTER — Telehealth: Payer: Self-pay | Admitting: Pulmonary Disease

## 2013-09-08 DIAGNOSIS — C4490 Unspecified malignant neoplasm of skin, unspecified: Secondary | ICD-10-CM

## 2013-09-10 NOTE — Telephone Encounter (Signed)
Pt is strictly primary care- no lung conditions. Please advise Dr Kari Baars if patient can keep appt in Sept 2015  LMOM x 1 for pt to return call

## 2013-09-16 NOTE — Telephone Encounter (Signed)
Per SN---  Ok to keep appt in September.    Called and lmomtcb for the pt.

## 2013-09-17 NOTE — Telephone Encounter (Signed)
Called spoke with pt. He is scheduled to see SN 12/21/13 at 9:30.  Pt also states he needs a referral to Dr. Allyson Sabal. He will see him 10/05/13 and needs referral from PCP. He see's him for sun damage to skin. Please advise SN thanks

## 2013-09-17 NOTE — Telephone Encounter (Signed)
Order has been placed for the pt for dermatology appt.  Nothing further is needed.

## 2013-09-17 NOTE — Telephone Encounter (Signed)
Pt calling a/b appt in sept as well as needing a referral for a dermotoligist dr lumpton .William [name]

## 2013-10-05 ENCOUNTER — Other Ambulatory Visit: Payer: Self-pay | Admitting: Dermatology

## 2013-10-19 ENCOUNTER — Other Ambulatory Visit: Payer: Self-pay | Admitting: Dermatology

## 2013-10-26 ENCOUNTER — Telehealth: Payer: Self-pay | Admitting: Pulmonary Disease

## 2013-10-26 DIAGNOSIS — G20 Parkinson's disease: Secondary | ICD-10-CM

## 2013-10-26 NOTE — Telephone Encounter (Signed)
Referral has been placed. Nothing further needed.   

## 2013-10-26 NOTE — Telephone Encounter (Signed)
Per SN okay to send in referral.

## 2013-10-26 NOTE — Telephone Encounter (Signed)
Called spoke with pt. He is requesting a referral to [facility]. He is scheduled to see them Thursday. Please advise SN. thanks

## 2013-10-28 ENCOUNTER — Ambulatory Visit (INDEPENDENT_AMBULATORY_CARE_PROVIDER_SITE_OTHER): Payer: Commercial Managed Care - HMO | Admitting: Neurology

## 2013-10-28 ENCOUNTER — Encounter: Payer: Self-pay | Admitting: Neurology

## 2013-10-28 VITALS — BP 135/75 | HR 53 | Ht 76.0 in | Wt 177.0 lb

## 2013-10-28 DIAGNOSIS — Z87898 Personal history of other specified conditions: Secondary | ICD-10-CM

## 2013-10-28 DIAGNOSIS — Z8669 Personal history of other diseases of the nervous system and sense organs: Secondary | ICD-10-CM

## 2013-10-28 DIAGNOSIS — D4959 Neoplasm of unspecified behavior of other genitourinary organ: Secondary | ICD-10-CM

## 2013-10-28 DIAGNOSIS — Z8679 Personal history of other diseases of the circulatory system: Secondary | ICD-10-CM

## 2013-10-28 DIAGNOSIS — G20 Parkinson's disease: Secondary | ICD-10-CM

## 2013-10-28 DIAGNOSIS — D49519 Neoplasm of unspecified behavior of unspecified kidney: Secondary | ICD-10-CM

## 2013-10-28 DIAGNOSIS — Z8673 Personal history of transient ischemic attack (TIA), and cerebral infarction without residual deficits: Secondary | ICD-10-CM

## 2013-10-28 MED ORDER — CARBIDOPA-LEVODOPA 25-100 MG PO TABS: 1.0000 | ORAL_TABLET | Freq: Four times a day (QID) | ORAL | Status: DC

## 2013-10-28 MED ORDER — RASAGILINE MESYLATE 1 MG PO TABS: 1.0000 mg | ORAL_TABLET | Freq: Every day | ORAL | Status: DC

## 2013-10-28 NOTE — Progress Notes (Signed)
Subjective:    Patient ID: William [name] is a 80 y.o. male.  HPI   Interim history:   William [name] is an 80 year old right-handed gentleman with an underlying medical history of right TIA in January 2003, SDH (s/p left craniotomy in August 2006), hypertension, hypothyroidism, thyroid cancer, partial onset seizures, off Dilantin, lumbar spine disease, status post lower back surgery at L4-5 in January 2014, renal tumor, status post kidney tumor ablative surgery in March 2014, who presents for followup consultation of his right-sided predominant [name]. He is accompanied by [name] again today. I last saw him on 12/07/2012, at which time I felt that he was doing well on Azilect and levodopa. He called in December 2014 with problems with blood pressure fluctuations. He was wondering if this came from the [place] but I did not think it was due to the Azilect per se. I was reluctant to take him off of it.   Today, he reports doing well. No cognitive complaints, no mood issues, plays golf 3 times a week. Driving well. He feels that his outlook on life is a little bit better since he started to light. May be he is just less frustrated when he started noticing improvement in some of his fine motor skills. He has been on the same dose of carbidopa-levodopa for the past 2-3 years as he recalls.  I first met him on 06/03/2012 and he previously followed by Dr. Morene Antu. He has had primarily right-sided symptoms with regards to his Parkinson's, diagnosed in 2010 with symptoms dating back to late 2009 or early 2010. He had briefly tried Mirapex but was taken off d/t hypotension. L spine MRI in June 2013 showed renal cysts, degenerative joint disease most prominent at L4-5. He tried acupuncture. His MRI of the lumbar spine showed abnormalities with his kidney with a cyst and a tumor and he had ablative surgery on 07/10/12 and had a FU CT done. He had lower back surgery on 05/14/12.  I saw him back on  09/03/2012 and I suggested starting Azilect. He has been tolerating it well and both he and his girlfriend felt that he did better with it in terms of dexterity and fine motor control. He had some hypotension and lightheadedness and reduced his BP medication. He has been having some issue with gout.   His Past Medical History Is Significant For: Past Medical History  Diagnosis Date  . Atherosclerotic vascular disease   . Hyperlipidemia   . Hx of colonic polyps   . Benign prostatic hypertrophy with urinary obstruction   . TIA (transient ischemic attack)   . Subdural hematoma   . Tremor   . DJD (degenerative joint disease)   . Gout   . Malignant neoplasm of thyroid gland   . Primary skin squamous cell carcinoma   . Complication of anesthesia     "difficulty intubation, had to use fiberoptic 2006"  . Hypertension     sees [name]  . Thyroid disease   . Pneumonia     hx of in 1952  . Kidney tumor   . Parkinson disease   . Difficult intubation   . Hypothyroidism     His Past Surgical History Is Significant For: Past Surgical History  Procedure Laterality Date  . Cataract sugery    . Cholecystectomy    . Right inguinal hernia repair    . Thyroidectomy for medullary carcinoma    . Eye surgery    . Knee arthroscopy    .  Appendectomy    . Tonsillectomy    . Lumbar laminectomy/decompression microdiscectomy  05/14/2012    Procedure: LUMBAR LAMINECTOMY/DECOMPRESSION MICRODISCECTOMY 1 LEVEL;  Surgeon: [name], MD;  Location: [facility];  Service: Neurosurgery;  Laterality: Left;  Left Lumbar four-five Laminectomy/Diskectomy/Far lateral diskectomy  . Kidney surgery  07-10-12    His Family History Is Significant For: Family History  Problem Relation Age of Onset  . Stroke Mother   . Alzheimer's disease Brother   . Alzheimer's disease Father   . Alzheimer's disease Sister     His Social History Is Significant For: History   Social History  . Marital Status:  Widowed    Spouse Name: N/A    Number of Children: 3  . Years of Education: N/A   Occupational History  . retired    Social History Main Topics  . Smoking status: Former Smoker -- 0.80 packs/day for 15 years    Quit date: 04/15/1965  . Smokeless tobacco: Never Used  . Alcohol Use: Yes     Comment: 1 glass of wine every night  . Drug Use: No  . Sexual Activity: None   Other Topics Concern  . None   Social History Narrative  . None    His Allergies Are:  No Known Allergies:   His Current Medications Are:  Outpatient Encounter Prescriptions as of 10/28/2013  Medication Sig  . aspirin EC 81 MG tablet Take 81 mg by mouth daily.  . carbidopa-levodopa (SINEMET IR) 25-100 MG per tablet Take 1 tablet by mouth 3 (three) times daily.  Marland Kitchen doxazosin (CARDURA) 8 MG tablet Take 0.5 tablets (4 mg total) by mouth at bedtime.  Marland Kitchen levothyroxine (SYNTHROID) 150 MCG tablet Take 1 tablet (150 mcg total) by mouth daily before breakfast.  . losartan-hydrochlorothiazide (HYZAAR) 100-12.5 MG per tablet Take 1 tablet by mouth daily.  . Multiple Vitamin (MULTIVITAMIN WITH MINERALS) TABS Take 1 tablet by mouth daily.  . rasagiline (AZILECT) 1 MG TABS tablet Take 1 tablet (1 mg total) by mouth daily.  . simvastatin (ZOCOR) 40 MG tablet Take 20 mg by mouth every evening.  : Review of Systems:  Out of a complete 14 point review of systems, all are reviewed and negative with the exception of these symptoms as listed below:  Review of Systems  Constitutional: Negative.   HENT: Negative.   Eyes: Negative.   Respiratory: Negative.   Cardiovascular: Negative.   Gastrointestinal: Negative.   Endocrine: Negative.   Genitourinary: Negative.   Musculoskeletal: Negative.   Skin: Negative.   Allergic/Immunologic: Negative.   Neurological: Positive for tremors.  Hematological: Negative.   Psychiatric/Behavioral: Negative.    Objective:  Neurologic Exam  Physical Exam Physical Examination:   Filed  Vitals:   10/28/13 1124  BP: 135/75  Pulse: 53   General Examination: The patient is a very pleasant 80 y.o. male in no acute distress.  HEENT: He has moderate degree of nuchal rigidity with some decrease in passive range of motion. He reports no neck pain. Face is symmetric with decrease in facial animation. Speech is mildly low in volume but not dysarthric. Hearing is grossly intact. Extraocular tracking is fairly good with mild saccadic breakdown. Pupils are reactive to light. Airway is clear with mild mouth dryness noted.  Chest is clear to auscultation without wheezing, or rhonchi or crackles noted.  Heart sounds are normal without murmurs, rubs or gallops.  Abdomen is soft, nontender with normal bowel sounds. He has no pitting edema in  the distal lower extremities. Skin is warm and dry. Neurologically: Mental status: The patient is awake, alert and oriented in all 4 spheres. His memory, attention, language and knowledge are appropriate. He has no aphasia, agnosia, apraxia or anomia. Cranial nerves are as described under HEENT exam. Motor exam: Normal bulk and strength is noted. He has increased tone in the right upper extremity and to a lesser degree in the right lower extremity. Mild cogwheeling is noted. He has a moderate degree of resting tremor in the right upper extremity only. He has no other tremor. He has a mild to moderate postural and action tremor in the right upper extremity. Fine motor skills with finger taps and hand movements are moderately impaired on the right upper extremity and otherwise mildly impaired in the right lower extremity and mildly impaired on the left side. He stands up fairly well from the seated position and his posture is age-appropriate to mildly stooped for age. He walks with decreased arm swing on the right and turns fairly well. Balance is preserved. Sensory exam is intact to light touch, pinprick, vibration and temperature. Romberg is negative. Reflexes are 1+.    Assessment and Plan:   In summary, Mr. Arndt is a very pleasant 80 year old gentleman with right-sided predominant, tremor predominant [name], in its 5th+ year. He has a complex history and is status post lower back surgery at level L4/5 in January 2014, and kidney tumor ablative surgery in March 2014. He has done well after his surgeries and has had physical therapy. His exam is fairly stable and he feels better since the addition of Azilect. He continues with carbidopa-levodopa 3 times a day at this time. At this time I would like to introduce a fourth pill of levodopa and he is advised to take one at 8, 12, 5 PM and 9 or 10. He is going to continue with Azilect and I provided both prescriptions in hard copy as he will now start filling his Rx at the [place]. [facility]. I encouraged him to call with any interim questions, concerns, refill request for updates. They were in agreement. I suggested that I see him back in about 4 months.

## 2013-10-28 NOTE — Patient Instructions (Signed)
Let's try to increase your Sinemet to 4 pills a day: take one at 8 AM, 12 (noon), 5 PM and 9 or 10 PM. I think your [name] has remained fairly stable, which is reassuring. Nevertheless, as you know, this disease does progress with time. It can affect your balance, your memory, your mood, your bowel and bladder function, your posture, balance and walking. Overall you are doing fairly well but I do want to suggest a few things today:  Remember to drink plenty of fluid, eat healthy meals and do not skip any meals. Try to eat protein with a every meal and eat a healthy snack such as fruit or nuts in between meals. Try to keep a regular sleep-wake schedule and try to exercise daily, particularly in the form of walking, 20-30 minutes a day, if you can.   Taking your medication on schedule is key.   Try to stay active physically and mentally. Engage in social activities in your community and with your family and try to keep up with current events by reading the newspaper or watching the news. Try to do word puzzles and you may like to do word puzzles and brain games on the computer such as on [email or web address]   As far as your medications are concerned, I would like to suggest that you take your current medication with the following additional changes: see above.    As far as diagnostic testing, I will order: no new test needed.   I would like to see you back in 6 months, sooner if we need to. Please call us with any interim questions, concerns, problems, updates or refill requests.  Please also call us for any test results so we can go over those with you on the phone. Our nursing staff will answer any of your questions and relay your messages to me and also relay most of my messages to you.  Our phone number is [phone]. We also have an after hours call service for urgent matters and there is a physician on-call for urgent questions, that cannot wait till the next work day. For any  emergencies you know to call 911 or go to the nearest emergency room.

## 2013-12-21 ENCOUNTER — Other Ambulatory Visit (INDEPENDENT_AMBULATORY_CARE_PROVIDER_SITE_OTHER): Payer: Commercial Managed Care - HMO

## 2013-12-21 ENCOUNTER — Ambulatory Visit (INDEPENDENT_AMBULATORY_CARE_PROVIDER_SITE_OTHER): Payer: Commercial Managed Care - HMO | Admitting: Pulmonary Disease

## 2013-12-21 ENCOUNTER — Encounter: Payer: Self-pay | Admitting: Pulmonary Disease

## 2013-12-21 VITALS — BP 110/70 | HR 53 | Temp 97.5°F | Ht 76.0 in | Wt 179.0 lb

## 2013-12-21 DIAGNOSIS — Z23 Encounter for immunization: Secondary | ICD-10-CM

## 2013-12-21 DIAGNOSIS — I10 Essential (primary) hypertension: Secondary | ICD-10-CM

## 2013-12-21 DIAGNOSIS — E785 Hyperlipidemia, unspecified: Secondary | ICD-10-CM

## 2013-12-21 DIAGNOSIS — I7090 Unspecified atherosclerosis: Secondary | ICD-10-CM

## 2013-12-21 DIAGNOSIS — C73 Malignant neoplasm of thyroid gland: Secondary | ICD-10-CM

## 2013-12-21 DIAGNOSIS — K5730 Diverticulosis of large intestine without perforation or abscess without bleeding: Secondary | ICD-10-CM

## 2013-12-21 LAB — BASIC METABOLIC PANEL
BUN: 23 mg/dL (ref 6–23)
CHLORIDE: 100 meq/L (ref 96–112)
CO2: 31 mEq/L (ref 19–32)
Calcium: 8.7 mg/dL (ref 8.4–10.5)
Creatinine, Ser: 1.4 mg/dL (ref 0.4–1.5)
GFR: 50.94 mL/min — ABNORMAL LOW (ref >60.00)
Glucose, Bld: 90 mg/dL (ref 70–99)
POTASSIUM: 3.6 meq/L (ref 3.5–5.1)
SODIUM: 139 meq/L (ref 135–145)

## 2013-12-21 LAB — CBC WITH DIFFERENTIAL/PLATELET
BASOS PCT: 0.7 % (ref 0.0–3.0)
Basophils Absolute: 0 10*3/uL (ref 0.0–0.1)
EOS ABS: 0.1 10*3/uL (ref 0.0–0.7)
Eosinophils Relative: 2.4 % (ref 0.0–5.0)
HEMATOCRIT: 44.2 % (ref 39.0–52.0)
Hemoglobin: 15 g/dL (ref 13.0–17.0)
LYMPHS ABS: 0.9 10*3/uL (ref 0.7–4.0)
Lymphocytes Relative: 22.2 % (ref 12.0–46.0)
MCHC: 33.9 g/dL (ref 30.0–36.0)
MCV: 89.4 fl (ref 78.0–100.0)
MONO ABS: 0.5 10*3/uL (ref 0.1–1.0)
Monocytes Relative: 12.1 % — ABNORMAL HIGH (ref 3.0–12.0)
NEUTROS PCT: 62.6 % (ref 43.0–77.0)
Neutro Abs: 2.4 10*3/uL (ref 1.4–7.7)
PLATELETS: 150 10*3/uL (ref 150.0–400.0)
RBC: 4.95 Mil/uL (ref 4.22–5.81)
RDW: 14.6 % (ref 11.5–15.5)
WBC: 3.8 10*3/uL — ABNORMAL LOW (ref 4.0–10.5)

## 2013-12-21 LAB — HEPATIC FUNCTION PANEL
ALT: 5 U/L (ref 0–53)
AST: 22 U/L (ref 0–37)
Albumin: 4.3 g/dL (ref 3.5–5.2)
Alkaline Phosphatase: 46 U/L (ref 39–117)
BILIRUBIN DIRECT: 0.2 mg/dL (ref 0.0–0.3)
BILIRUBIN TOTAL: 1.2 mg/dL (ref 0.2–1.2)
Total Protein: 7.2 g/dL (ref 6.0–8.3)

## 2013-12-21 LAB — LIPID PANEL
CHOL/HDL RATIO: 3
Cholesterol: 131 mg/dL (ref 0–200)
HDL: 49.9 mg/dL (ref >39.00)
LDL Cholesterol: 67 mg/dL (ref 0–99)
NONHDL: 81.1
Triglycerides: 72 mg/dL (ref 0.0–149.0)
VLDL: 14.4 mg/dL (ref 0.0–40.0)

## 2013-12-21 LAB — T3, FREE: T3 FREE: 3 pg/mL (ref 2.3–4.2)

## 2013-12-21 LAB — T4, FREE: Free T4: 1.31 ng/dL (ref 0.60–1.60)

## 2013-12-21 LAB — TSH: TSH: 0.54 u[IU]/mL (ref 0.35–4.50)

## 2013-12-21 NOTE — Patient Instructions (Signed)
Today we updated your med list in our EPIC system...    Continue your current medications the same...  Today we did your follow up FASTING blood work...    We will contact you w/ the results when available...   Keep up the good work w/ diet, exercise, etc...  Call for any questions...  Let's plan a follow up visit in 31mo, sooner if needed for problems.Marland KitchenMarland Kitchen

## 2013-12-21 NOTE — Progress Notes (Signed)
Subjective:    Patient ID: William [name], male    DOB: [birth year], 80 y.o.   MRN: [id number]  HPI 80 y/o WM here for a follow up visit... he has multiple medical problems as noted below... followed for HBP, Carotid vasc dis w/ hx of TIA & prev subdural hematoma, Chol, & prev surg for medullary thyroid carcinoma... his wife passed away from metastatic breast cancer 11/09...  ~  December 05, 2011:  33mo ROV & [name] relates lifting a mattress several months ago w/ back pain> eval by Lacie Scotts, DrBrooks w/ MRI showing bulging discs at L4-5 & treated w/ Pred w/o benefit, then ESI and this helped some but he has persistent left leg symptoms (on Hydrocodone & Aleve) and can't play golf yet; he had second opinion DrHirsh for [employer] they are waiting on surg;  Incidental finding on the MRI was a renal lesion ==> referred to DrGrapey & they have consulted DrYamagata, IR for cyst drainage and bx of lesion (sched for 9/13- may need cryoablation if it's a cancer)...    BP remains controlled on Hyzaar & Cardura;  Chol remains at goals on Simva20;  Thyroid unchanged & stable on Synthroid Rx;  DrLove manages his Parkinson's & recently increased his Sinemet...    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  ~  June 09, 2012:  13mo ROV & [name] is here w/ his new girlfriend, [name]; he had back surg by DrHirsh 3 wks ago & is much improved he says; he notes a sinking feeling x2 since surg- both episodes occurred after eating  & while he was under a warm vent (he will avoid the vent,  and observe for any recurrent episodes);  We reviewed the following medical problems during today's office visit >>     HBP> on Hyzaar100-12.5 & Cardura8; BP=138/80 today & denies CP, palpit, SOB, edema...    ASPVD> on ASA 81mg  daily w/o cerebral ischemic symptoms etc; he is off Plavix ever since his SDH...    Chol> on Simva20, Fish Oil & diet rx; last FLP 8/13 showed TChol 137, TG 73, HDL 53, LDL 70    Hx medullary thyroid cancer> on Synthroid200-taking 1/2 alt w/1 Qod; followed by [facility] for [facility]; TFTs remain wnl w/ TSH 8/13 = 0.69     GI- Divertics, HxPolyps> last colon 2007 by DrSamLeB showed divertics, hems, no polyps & f/u suggested 45yrs but now>82y/o...    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    DJD, LBP> on Vicodin prn; he developed LBP after lifting a mattress 5/13; eval by DrBrooks & DrHirsh w/ MRI reported to show HNP at L4-5 (w/ myelopathy & radiculopathy); DrHirsh did LumbarLam 1/14 w/ decompression L4 & L5 w/ discectomy=> improved...    Neuro- TIA, Hx subdural hemtoma, Tremor/Parkinson's> Still followed by Gean Quint for Neuro (& he reports consult at [facility] from DrScott-  we do not have notes from them) on Sinemet 25-100 Tid; "I'm doing exercises"    Hx skin cancer> followed by Derm; SCCa removed from right forearm in 2006... We reviewed prob list, meds, xrays and labs> see below for updates >> he had the 2013 Flu vaccine 10/13, up to date on others...  LABS 1/14 preop in EPIC> Chems- ok & Cr=1.3-1.5;  CBC- ok  ~  December 10, 2012:  44mo ROV & [name] indicates a good interval w/o new complaints or concerns;  He had his right renal mass ablated by [facility] 3/14 & he reports that was pretty painful but he has recovered & is back to playing golf now;  We reviewed the following medical problems during today's office visit >>     HBP> on  Hyzaar100-12.5 & Cardura8-1/2; BP=120/72 today & denies CP, palpit, SOB, edema...    ASPVD> on ASA 81mg  daily w/o cerebral ischemic symptoms etc; he is off Plavix ever since his SDH...    Chol> on Simva40-1/2, Fish Oil & diet rx; FLP 8/14 shows TChol 128, TG 74, HDL 48, LDL 66    Hx medullary thyroid cancer> on Synthroid200-taking 1/2 alt w/1 Qod; followed by [facility] for CCS; TFTs remain wnl w/ TSH 8/14 = 0.54 & FreeT4= 1.28 (0.6-1.60)    GI- Divertics, HxPolyps> last colon 2007 by DrSamLeB showed divertics, hems, no polyps & f/u suggested 29yrs but now>82y/o...    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    DJD, LBP> on Vicodin prn; he developed LBP after lifting a mattress 5/13; eval by DrBrooks & DrHirsh w/ MRI reported to show HNP at L4-5 (w/ myelopathy & radiculopathy); DrHirsh did LumbarLam 1/14 w/ decompression L4 & L5 w/ diskectomy=> improved...    Neuro- TIA, Hx subdural hemtoma, Tremor/Parkinson's> now followed by DrAthar for Neuro on Sinemet25-100Tid & Azilect1mg - he reports doing better overall...    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 We reviewed prob list, meds, xrays and labs> see below for updates >>   LABS 8/14:  FLP- at goals on Simva20;  Chems- ok x Cr=1.5;  CBC- wnl;  TSH=0.54 & FreeT4=1.28 (0.6-1.60)...  ~  June 16, 2013:  77mo ROV & [name] describes some stress w/ girlfriends LBP/ ZOXWR6/ complications... No new complaints or concerns... We reviewed the following medical problems during today's office visit >>     HBP> on Hyzaar100-12.5 & Cardura8-1/2; BP=120/58 today & denies CP, palpit, SOB, edema...    ASPVD> on ASA 81mg  daily w/o cerebral ischemic symptoms etc; he is off Plavix ever since his SDH...    Chol> on Simva40-1/2, Fish Oil & diet  rx; FLP 8/14 shows TChol 128, TG 74, HDL 48, LDL 66    Hx medullary thyroid cancer> on Synthroid200-taking 1/2 alt w/1 Qod; followed by [facility] for CCS; TFTs remain wnl w/ TSH 8/14 = 0.54 & FreeT4= 1.28 (0.6-1.60)    GI- Divertics, HxPolyps> last colon 2007 by DrSamLeB showed divertics, hems, no polyps & f/u suggested 55yrs but now>82y/o...    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    Neuro- TIA, Hx subdural hemtoma, Tremor/Parkinson's> now followed by DrAthar for Neuro on Sinemet25-100Tid & Azilect1mg - he reports doing better overall...    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 We reviewed prob list, meds, xrays and labs> see below for updates >> Rx for shingles vax...  CT Abd showed further contraction of the cryoablation zone within the lower part of the right kidney, no enhancement, bilat renal cysts, chr right UPJ stenosis; Incidentally noted [place], mult splenic granuloma, diffuse Ao atherosclerosis & dilatation of infrarenal Ao to 3.3cm, DJD in lumbar spine...   ~  December 21, 2013:  70mo ROV & [name] describes a good interval, his stress is diminished & he's planning a river cruise;  We reviewed the following medical problems during today's office visit >>     HBP> on Hyzaar100-12.5 & Cardura8-1/2; BP=110/70 today & denies CP, palpit, SOB, edema...    ASPVD> on ASA 81mg  daily w/o cerebral ischemic symptoms etc; he is off Plavix ever since his SDH...    Chol> on Simva40-1/2, Fish Oil & diet rx; FLP 9/15  shows TChol 131, TG 72, HDL 50, LDL 67    Hx medullary thyroid cancer> on Synthroid150; followed by [facility] for [facility]; TFTs remain wnl w/ TSH 9/15 = 0.54 & FreeT3=3.0 (2.3-4.2) & FreeT4= 1.31 (0.6-1.60)    GI- Divertics, HxPolyps> on Align; last colon 2007 by DrSamLeB showed divertics, hems, no polyps & f/u suggested 13yrs but now>82y/o...    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    DJD, LBP> off Vicodin; he developed LBP after lifting a mattress 5/13; eval by DrBrooks & DrHirsh w/ MRI reported to show HNP at L4-5 (w/ myelopathy & radiculopathy); DrHirsh did LumbarLam 1/14 w/ decompression L4 & L5 w/ diskectomy=> improved...    Neuro- TIA, Hx subdural hemtoma, Tremor/Parkinson's> now followed by DrAthar for Neuro on Sinemet25-100Tid & Azilect1mg - he reports doing better overall...    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 We reviewed prob list, meds, xrays and labs> see below for updates >> OK Flu shot today...  LABS 9/15:  FLP- at goals on Simva20;  Chems- ok x Cr=1.4;  CBC- wnl;  TSH=0.54;  FreeT3 & FreeT4- wnl...           Problem List:  HYPERTENSION (ICD-401.9) - controlled on HYZAAR 100/25 daily, & CARDURA 8mg - taking 1/2 daily... ~  2/13:  BP= 122/60 & he denies CP, palpit, SOB, edema, etc... ~  8/13:  BP= 140/80 & he remains largely asymptomatic... ~  CXR 1/14 showed normal heart size, ectatic & calcif Ao, low lying diaph, clear lungs, NAD... ~  EKG 1/14 showed NSR, rate61,  Poor R prog V1-3, NSSTTWA... ~  2/14: on Hyzaar100-12.5 & Cardura8; BP=138/80 today & denies CP, palpit, SOB, edema. ~  3/15: on Hyzaar100-12.5 & Cardura8-1/2; BP=120/58 today & he remains asymptomatic... ~  9/15: on Hyzaar100-12.5 &  Cardura8-1/2; BP=110/70 today & denies CP, palpit, SOB, edema.Marland KitchenMarland Kitchen  ATHEROSCLEROTIC VASCULAR DISEASE (ICD-440.9) - on ASA 81mg /d;  Prev Plavix was discontinued after his subdural hematoma in 2006. ~  Eval 2003 by [facility] for TIA showed cerebrovasc dis w/ R>L PCA stenoses... ~  4/15: f/u CT Abd (f/u cryoablation of renal oncocytoma) also showed   HYPERLIPIDEMIA (ICD-272.4) - on SIMVASTATIN 40mg -taking 1/2 + FISH OIL 1000mg /d> doing well. ~  FLP 12/07 on Zocor showed TChol 137, HDL 47, LDL 70 ~  FLP 7/09 off med showed TChol 196, TG 95, HDL 49, LDL 128... rec> restart Zocor 40mg . ~  FLP 7/10 showed TChol 135, TG 47, HDL 53, LDL 73... He decr dose to 1/2 tab on his own... 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 ~  labs 7/09 showed TSH= 3.47... on SYNTHROID 230mcg/d ~  labs 7/10 showed TSH= 2.95 ~  labs 8/11 showed TSH= 1.20 ~  Labs 8/12 showed TSH= 0.23 & pt rec to decr the Synthroid200 to only 1/2 on MWF.Marland Kitchen. ~  Labs 2/13 showed TSH= 2.29 on Synthroid200- taking 1x4d=TThSS and 1/2 x3d=MWF.Marland Kitchen. ~  Labs 8/13 showed TSH= 0.69... On same dose. ~  Labs 8/14 on Synthroid200-taking 1 alt w/ 1/2 qod showed TSH=0.54, Free T4=1.28 ~  3/15: we discussed change in Synthroid to 155mcg/d... ~  Labs 9/15 on Synthroid150 showed TSH = 0.54 & FreeT3=3.0 (2.3-4.2) & FreeT4= 1.31 (0.6-1.60)  COLONIC POLYPS (ICD-211.3) - last colonoscopy 1/07 by DrSam showed divertics & hems only... f/u planned 12yrs.  BENIGN PROSTATIC HYPERTROPHY, WITH OBSTRUCTION (ICD-600.01) - he sees DrDavis for Urology once a  year... he has a brother who had prostate cancer...  ~  PSA followed by [facility] yrearly... 7/10 pt reports PSA>5 (was 4.17 here) & Rx w/ Cipro... ~  he had f/u Urology eval DrGrapey 5/11- pt indicates that his PSA was OK... 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 ~  2/14:  followed by DrGrapey & DrYamagata re-imaged the lesion via CT 1/14 w/ growth evident & he has rec Cryoablation=> pending due to need for LLam first... ~  3/14:  he underwent cryoablation of right renal oncocytic tumor by DrYamagata (pt reports painful procedure) ~  5/14:  f/u visit w/ IR & CT Abd=> 2 left renal cysts &  lower pole right renal cyst, cryoablation zone in inferior pole right kidney measures 4x3cm; atherosclerotic changes in Ao, mult splenic granulomata, prom CBD... ~  9/14: f/u visit w/ IR, DrYamagata> s/p percut cryoablation of right renal oncocytic tumor 3/14; doing well, CTshowed expected post ablation changes, no resid or recurrent tumor suspected...  ~  1/15: f/u visit w/ DrGrapey> doing well from the renal tumor standpoint, BPH, LTOS, ED, etc... ~  4/14: he had f/u DrYamagata> doing well, asymptomatic- BUN=17, Cr=1.21, f/u CT showed further retraction of right sided cryoablation defect, no abnormal enhancement, bilat renal cysts...0                        TRANSIENT ISCHEMIC ATTACK (ICD-435.9) - hx of recurrent right brain TIA's in 2003... initially on ASA + Plavix and the Plavix was stopped after his subdural in 2006...  Hx of SUBDURAL HEMATOMA (ICD-432.1) - hosp 7/06 w/ left subdural hematoma req craniotomy by DrHirsh for evacuation... no known trauma- he was on ASA/ Plavix and developed a headache... he also take DILANTIN 100mg  Tid now per [facility] (for a partial seizure characterized by aphasia)... ~  7/10: pt tells me he has decreased his Dilantin to Bid and will f/u w/ DrLove. ~  8/11:  we don't have any recent notes from [facility], but pt is off the Dilantin rx.  TREMOR (ICD-781.0) - he notes mild tremor right hand & arm> eval from Neurology- Gean Quint- indicated prob early [name] and after a period of observation they started Mirapex (stopped due to hypotension), changed to [name] 2/12> dosed per DrLove. ~  8/11: pt tells me that he has started accupuncture treatments in [place]==> no benefit. ~  2/12:  pt indicates that DrLove stopped the Mirapex in favor of SINEMET 25/100- taking 1/2 Tid. ~  8/12:  Pt tells me he has seen DrScott, Neurology at [facility] (?referred by [facility], ?second opinion), we don't have records, pt reports no change in meds (he considered entering a drug study). ~  He continues to f/u w/ DrLove every 4-11months... ~  8/13:  He indicates that Drlove recently increased the SINEMET 25/100 to 1 tab Tid... ~  8/14:  He had Neuro f/u DrAthar> note reviewed & pt stable on Sinemet Tid + Azilect 1mg /d (added 5/14 by Neuro)...  DEGENERATIVE JOINT DISEASE (ICD-715.90) - he notes some knee discomfort & he takes Aleve & HYDROCODONE as needed... LBP >> injured back lifting mattress 5/13; eval by DrBrooks & DrHirsh w/ abn MRI (done by Ortho at their office) & disc herniation L4-5 by report; Pred w/o help, ESI helped some, using pain Rx & holding off on surg per DrHirsh... ~  1/14: on Vicodin prn; eval by DrBrooks & DrHirsh w/ MRI reported  to show HNP at L4-5 (w/ myelopathy & radiculopathy); DrHirsh did LumbarLam 1/14 w/ decompression L4 & L5 w/ discectomy=> improved...  Hx of CARCINOMA, SKIN, SQUAMOUS CELL (ICD-173.9) - s/p removal of skin cancer from his right forearm 11/06 by DrLupton... ~  9/15: he reports another skin cancer removed...  Health Maintenance:  he had PNEUMOVAX vaccine in 1998 & 1/11... gets yearly Flu vaccine every fall... TDAP given 8/11...   Past Surgical History  Procedure Laterality Date  . Cataract sugery    . Cholecystectomy    . Right inguinal hernia repair    . Thyroidectomy for medullary carcinoma    . Eye surgery    . Knee arthroscopy    . Appendectomy    .  Tonsillectomy    . Lumbar laminectomy/decompression microdiscectomy  05/14/2012    Procedure: LUMBAR LAMINECTOMY/DECOMPRESSION MICRODISCECTOMY 1 LEVEL;  Surgeon: [name], MD;  Location: [place] [facility];  Service: Neurosurgery;  Laterality: Left;  Left Lumbar four-five Laminectomy/Diskectomy/Far lateral diskectomy  . Kidney surgery  07-10-12    Outpatient Encounter Prescriptions as of 12/21/2013  Medication Sig  . aspirin EC 81 MG tablet Take 81 mg by mouth daily.  . carbidopa-levodopa (SINEMET IR) 25-100 MG per tablet Take 1 tablet by mouth 4 (four) times daily. At 8, 12, 5 PM, and 9 or 10 PM  . doxazosin (CARDURA) 8 MG tablet Take 0.5 tablets (4 mg total) by mouth at bedtime.  Marland Kitchen levothyroxine (SYNTHROID) 150 MCG tablet Take 1 tablet (150 mcg total) by mouth daily before breakfast.  . losartan-hydrochlorothiazide (HYZAAR) 100-12.5 MG per tablet Take 1 tablet by mouth daily.  . Multiple Vitamin (MULTIVITAMIN WITH MINERALS) TABS Take 1 tablet by mouth daily.  . rasagiline (AZILECT) 1 MG TABS tablet Take 1 tablet (1 mg total) by mouth daily.  . simvastatin (ZOCOR) 40 MG tablet Take 20 mg by mouth every evening.    No Known Allergies   Current Medications, Allergies, Past Medical History, Past Surgical History, Family History, and  Social History were reviewed in [employer] record.    Review of Systems       See HPI - all other systems neg except as noted...       The patient complains of dyspnea on exertion, right hand tremor, and difficulty walking.  The patient denies anorexia, fever, weight loss, weight gain, vision loss, decreased hearing, hoarseness, chest pain, syncope, peripheral edema, prolonged cough, headaches, hemoptysis, abdominal pain, melena, hematochezia, severe indigestion/heartburn, hematuria, incontinence, muscle weakness, suspicious skin lesions, transient blindness, depression, unusual weight change, abnormal bleeding, enlarged lymph nodes, and angioedema.     Objective:   Physical Exam     WD, WN, 80 y/o WM in NAD... GENERAL:  Alert & oriented; pleasant & cooperative... HEENT:  [place]/AT, EOM-wnl, PERRLA, EACs-clear, TMs-wnl, NOSE-clear, THROAT-clear & wnl. NECK:  Supple w/ fairROM; no JVD; normal carotid impulses w/o bruits; thyroid area scar, no nodules palp; no lymphadenopathy. CHEST:  Clear to P & A; without wheezes/ rales/ or rhonchi heard... HEART:  Regular Rhythm; without murmurs/ rubs/ or gallops detected... ABDOMEN:  Soft & nontender; normal bowel sounds; no organomegaly or masses palpated... BACK:  Scar of LLam surg... EXT: without deformities, mild arthritic changes; no varicose veins/ +venous insuffic/ no edema. NEURO:  CN's intact;  mod tremor right hand & arm at rest, ?early mask facies, still moves well. DERM:  No lesions noted; no rash etc...  RADIOLOGY DATA:  Reviewed in the EPIC EMR & discussed w/ the patient...  LABORATORY DATA:  Reviewed in the EPIC EMR & discussed w/ the patient...   Assessment & Plan:    HBP>  Controlled on meds, continue same...  PVD>  On ASA w/o cerebral ischemic symptoms, continue same...  HYPERLIPID>  On Simva20 + Fish Oil & FLPs have been at goal, continue same...  Hx Medullary Thyroid Carcinoma>  S/p surg by  DrNewman2002, on Synthroid 150mg /d & TSH is ok on this dose...  GI> remote hx polyp, divertics, hems>  Last colon 2007 by by DrSam & suggested f/u 46yrs...  GU>  Followed by DrGrapey now, RENAL LESION found incidentally on MRI Lumbar spine & w/u revealed oncocytoma w/ cryoablation per [facility] 3/14...  NEURO> Hx TIA, Hx SDH, Tremor/ Parkinson's>  Followed by Gean Quint & now DrAthar on Sinemet & Azilect, improved...  DJD, LBP>  He had decompressive LLam 1/14 by DrHirsh & is recovering nicely...  Other medical issues as noted...   Patient's Medications  New Prescriptions   No medications on file  Previous Medications   ASPIRIN EC 81 MG TABLET    Take 81 mg by mouth daily.   CARBIDOPA-LEVODOPA (SINEMET IR) 25-100 MG PER TABLET    Take 1 tablet by mouth 4 (four) times daily. At 8, 12, 5 PM, and 9 or 10 PM   DOXAZOSIN (CARDURA) 8 MG TABLET    Take 0.5 tablets (4 mg total) by mouth at bedtime.   LEVOTHYROXINE (SYNTHROID) 150 MCG TABLET    Take 1 tablet (150 mcg total) by mouth daily before breakfast.   LOSARTAN-HYDROCHLOROTHIAZIDE (HYZAAR) 100-12.5 MG PER TABLET    Take 1 tablet by mouth daily.   MULTIPLE VITAMIN (MULTIVITAMIN WITH MINERALS) TABS    Take 1 tablet by mouth daily.   PROBIOTIC PRODUCT (ALIGN) 4 MG CAPS    Take 1 capsule by mouth daily.   RASAGILINE (AZILECT) 1 MG TABS TABLET    Take 1 tablet (1 mg total) by mouth daily.   SIMVASTATIN (ZOCOR) 40 MG TABLET    Take 20 mg by mouth every evening.  Modified Medications   No medications on file  Discontinued Medications   No medications on file

## 2014-01-27 ENCOUNTER — Telehealth: Payer: Self-pay | Admitting: Pulmonary Disease

## 2014-01-27 NOTE — Telephone Encounter (Signed)
Spoke with pt and he states that he thought he needed a referral for routine eye exam with eye dr but the states that nothing further is needed and he does not need a referral.

## 2014-02-01 ENCOUNTER — Other Ambulatory Visit: Payer: Self-pay | Admitting: Dermatology

## 2014-03-03 ENCOUNTER — Other Ambulatory Visit: Payer: Self-pay | Admitting: Pulmonary Disease

## 2014-03-07 ENCOUNTER — Telehealth: Payer: Self-pay | Admitting: Neurology

## 2014-03-07 NOTE — Telephone Encounter (Signed)
Confirmed appointment time for 04/29/14 with patient.

## 2014-04-21 ENCOUNTER — Telehealth: Payer: Self-pay | Admitting: Pulmonary Disease

## 2014-04-21 DIAGNOSIS — G459 Transient cerebral ischemic attack, unspecified: Secondary | ICD-10-CM

## 2014-04-21 DIAGNOSIS — N400 Enlarged prostate without lower urinary tract symptoms: Secondary | ICD-10-CM

## 2014-04-21 NOTE — Telephone Encounter (Signed)
Referrals have been placed per pts request.  Pt is aware and nothing further is needed.

## 2014-04-26 ENCOUNTER — Other Ambulatory Visit (INDEPENDENT_AMBULATORY_CARE_PROVIDER_SITE_OTHER): Payer: Commercial Managed Care - HMO

## 2014-04-26 ENCOUNTER — Ambulatory Visit (INDEPENDENT_AMBULATORY_CARE_PROVIDER_SITE_OTHER)
Admission: RE | Admit: 2014-04-26 | Discharge: 2014-04-26 | Disposition: A | Discharge status: Home / Self Care | Payer: Commercial Managed Care - HMO | Source: Ambulatory Visit | Attending: Pulmonary Disease | Admitting: Pulmonary Disease

## 2014-04-26 ENCOUNTER — Encounter: Payer: Self-pay | Admitting: Pulmonary Disease

## 2014-04-26 ENCOUNTER — Ambulatory Visit (INDEPENDENT_AMBULATORY_CARE_PROVIDER_SITE_OTHER): Payer: Commercial Managed Care - HMO | Admitting: Pulmonary Disease

## 2014-04-26 VITALS — BP 132/84 | HR 55 | Temp 97.0°F | Ht 76.0 in | Wt 185.6 lb

## 2014-04-26 DIAGNOSIS — I10 Essential (primary) hypertension: Secondary | ICD-10-CM

## 2014-04-26 DIAGNOSIS — I7091 Generalized atherosclerosis: Secondary | ICD-10-CM

## 2014-04-26 DIAGNOSIS — C73 Malignant neoplasm of thyroid gland: Secondary | ICD-10-CM

## 2014-04-26 DIAGNOSIS — D3001 Benign neoplasm of right kidney: Secondary | ICD-10-CM

## 2014-04-26 LAB — BASIC METABOLIC PANEL
BUN: 21 mg/dL (ref 6–23)
CO2: 36 mEq/L — ABNORMAL HIGH (ref 19–32)
Calcium: 8.6 mg/dL (ref 8.4–10.5)
Chloride: 98 mEq/L (ref 96–112)
Creatinine, Ser: 1.4 mg/dL (ref 0.4–1.5)
GFR: 52.61 mL/min — ABNORMAL LOW (ref >60.00)
GLUCOSE: 79 mg/dL (ref 70–99)
POTASSIUM: 4.2 meq/L (ref 3.5–5.1)
SODIUM: 137 meq/L (ref 135–145)

## 2014-04-26 NOTE — Patient Instructions (Signed)
Today we updated your med list in our EPIC system...    Continue your current medications the same...  We discussed taking an extra 1/2 CARDURA tab if needed for elevated BP readings...  Today we checked your follow up CXR & Metabolic panel/ renal function...    We will contact you w/ the results when available...   Call for any questions...  Keep your follow up appt scheduled for 06/21/14 & we will re-address your BP at that time.Marland KitchenMarland Kitchen

## 2014-04-26 NOTE — Progress Notes (Signed)
Subjective:    Patient ID: William [name], male    DOB: 1929/03/14, 81 y.o.   MRN: [id number]  HPI 81 y/o WM here for a follow up visit... he has multiple medical problems as noted below... followed for HBP, Carotid vasc dis w/ hx of TIA & prev subdural hematoma, Chol, & prev surg for medullary thyroid carcinoma... his wife passed away from metastatic breast cancer 11/09...  ~  December 05, 2011:  90mo ROV & [name] relates lifting a mattress several months ago w/ back pain> eval by Lacie Scotts, DrBrooks w/ MRI showing bulging discs at L4-5 & treated w/ Pred w/o benefit, then ESI and this helped some but he has persistent left leg symptoms (on Hydrocodone & Aleve) and can't play golf yet; he had second opinion DrHirsh for [employer] they are waiting on surg;  Incidental finding on the MRI was a renal lesion ==> referred to DrGrapey & they have consulted DrYamagata, IR for cyst drainage and bx of lesion (sched for 9/13- may need cryoablation if it's a cancer)...    BP remains controlled on Hyzaar & Cardura;  Chol remains at goals on Simva20;  Thyroid unchanged & stable on Synthroid Rx;  DrLove manages his Parkinson's & recently increased his Sinemet...    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  ~  June 09, 2012:  30mo ROV & [name] is here w/ his new girlfriend, [name]; he had back surg by DrHirsh 3 wks ago & is much improved he says; he notes a sinking feeling x2 since surg- both episodes occurred after eating  & while he was under a warm vent (he will avoid the vent,  and observe for any recurrent episodes);  We reviewed the following medical problems during today's office visit >>     HBP> on Hyzaar100-12.5 & Cardura8; BP=138/80 today & denies CP, palpit, SOB, edema...    ASPVD> on ASA 81mg  daily w/o cerebral ischemic symptoms etc; he is off Plavix ever since his SDH...    Chol> on Simva20, Fish Oil & diet rx; last FLP 8/13 showed TChol 137, TG 73, HDL 53, LDL 70    Hx medullary thyroid cancer> on Synthroid200-taking 1/2 alt w/1 Qod; [facility] for C[facility]; TFTs remain wnl w/ TSH 8/13 = 0.69     GI- Divertics, HxPolyps> last colon 2007 by DrSamLeB showed divertics, hems, no polyps & f/u suggested 69yrs but now>82y/o...    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    DJD, LBP> on Vicodin prn; he developed LBP after lifting a mattress 5/13; eval by DrBrooks & DrHirsh w/ MRI reported to show HNP at L4-5 (w/ myelopathy & radiculopathy); DrHirsh did LumbarLam 1/14 w/ decompression L4 & L5 w/ discectomy=> improved...    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 We reviewed prob list, meds, xrays and labs> see below for updates >> he had the 2013 Flu vaccine 10/13, up to date on others...  LABS 1/14 preop in EPIC> Chems- ok & Cr=1.3-1.5;  CBC- ok  ~  December 10, 2012:  44mo ROV & [name] indicates a good interval w/o new complaints or concerns;  He had his right renal mass ablated by [facility] 3/14 & he reports that was pretty painful but he has recovered & is back to playing golf now;  We reviewed the following medical problems during today's office visit >>     HBP> on  Hyzaar100-12.5 & Cardura8-1/2; BP=120/72 today & denies CP, palpit, SOB, edema...    ASPVD> on ASA 81mg  daily w/o cerebral ischemic symptoms etc; he is off Plavix ever since his SDH...    Chol> on Simva40-1/2, Fish Oil & diet rx; FLP 8/14 shows TChol 128, TG 74, HDL 48, LDL 66    Hx medullary thyroid cancer> on Synthroid200-taking 1/2 alt w/1 Qod; followed by [facility] for CCS; TFTs remain wnl w/ TSH 8/14 = 0.54 & FreeT4= 1.28 (0.6-1.60)    GI- Divertics, HxPolyps> last colon 2007 by DrSamLeB showed divertics, hems, no polyps & f/u suggested 31yrs but now>82y/o...    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    DJD, LBP> on Vicodin prn; he developed LBP after lifting a mattress 5/13; eval by DrBrooks & DrHirsh w/ MRI reported to show HNP at L4-5 (w/ myelopathy & radiculopathy); DrHirsh did LumbarLam 1/14 w/ decompression L4 & L5 w/ diskectomy=> improved...    Neuro- TIA, Hx subdural hemtoma, Tremor/Parkinson's> now followed by DrAthar for Neuro on Sinemet25-100Tid & Azilect1mg - he reports doing better overall...    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 We reviewed prob list, meds, xrays and labs> see below for updates >>   LABS 8/14:  FLP- at goals on Simva20;  Chems- ok x Cr=1.5;  CBC- wnl;  TSH=0.54 & FreeT4=1.28 (0.6-1.60)...  ~  June 16, 2013:  77mo ROV & [name] describes some stress w/ girlfriends LBP/ ZOXWR6/ complications... No new complaints or concerns... We reviewed the following medical problems during today's office visit >>     HBP> on Hyzaar100-12.5 & Cardura8-1/2; BP=120/58 today & denies CP, palpit, SOB, edema...    ASPVD> on ASA 81mg  daily w/o cerebral ischemic symptoms etc; he is off Plavix ever since his SDH...    Chol> on Simva40-1/2, Fish Oil & diet  rx; FLP 8/14 shows TChol 128, TG 74, HDL 48, LDL 66    Hx medullary thyroid cancer> on Synthroid200-taking 1/2 alt w/1 Qod; followed by [facility] for CCS; TFTs remain wnl w/ TSH 8/14 = 0.54 & FreeT4= 1.28 (0.6-1.60)    GI- Divertics, HxPolyps> last colon 2007 by DrSamLeB showed divertics, hems, no polyps & f/u suggested 57yrs but now>82y/o...    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    Neuro- TIA, Hx subdural hemtoma, Tremor/Parkinson's> now followed by DrAthar for Neuro on Sinemet25-100Tid & Azilect1mg - he reports doing better overall...    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 We reviewed prob list, meds, xrays and labs> see below for updates >> Rx for shingles vax...  CT Abd showed further contraction of the cryoablation zone within the lower part of the right kidney, no enhancement, bilat renal cysts, chr right UPJ stenosis; Incidentally noted [place], mult splenic granuloma, diffuse Ao atherosclerosis & dilatation of infrarenal Ao to 3.3cm, DJD in lumbar spine...   ~  December 21, 2013:  31mo ROV & [name] describes a good interval, his stress is diminished & he's planning a river cruise;  We reviewed the following medical problems during today's office visit >>     HBP> on Hyzaar100-12.5 & Cardura8-1/2; BP=110/70 today & denies CP, palpit, SOB, edema...    ASPVD> on ASA 81mg  daily w/o cerebral ischemic symptoms etc; he is off Plavix ever since his SDH...    Chol> on Simva40-1/2, Fish Oil & diet rx; FLP 9/15  shows TChol 131, TG 72, HDL 50, LDL 67    Hx medullary thyroid cancer> on Synthroid150; followed by [facility] for CCS; TFTs remain wnl w/ TSH 9/15 = 0.54 & FreeT3=3.0 (2.3-4.2) & FreeT4= 1.31 (0.6-1.60)    GI- Divertics, HxPolyps> on Align; last colon 2007 by DrSamLeB showed divertics, hems, no polyps & f/u suggested 38yrs but now>82y/o...    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    DJD, LBP> off Vicodin; he developed LBP after lifting a mattress 5/13; eval by DrBrooks & DrHirsh w/ MRI reported to show HNP at L4-5 (w/ myelopathy & radiculopathy); DrHirsh did LumbarLam 1/14 w/ decompression L4 & L5 w/ diskectomy=> improved...    Neuro- TIA, Hx subdural hemtoma, Tremor/Parkinson's> now followed by DrAthar for Neuro on Sinemet25-100Tid & Azilect1mg - he reports doing better overall...    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 We reviewed prob list, meds, xrays and labs> see below for updates >> OK Flu shot today...  LABS 9/15:  FLP- at goals on Simva20;  Chems- ok x Cr=1.4;  CBC- wnl;  TSH=0.54;  FreeT3 & FreeT4- wnl...  ~  April 26, 2014:  38mo ROV & add-on appt requested for HBP> he has been well controlled on Cardura8- 1/2 daily and Hyzaar100-12.5; over the last week his home BP checks were in the 200/100 range and [name] notes that "he feels it"; pt states that his head feels heavy- d3enies CP, palpit, SOB, edema; I note that wt is up 7# to 186# today, he denies eating salt; BP here today= 132/84 & they didn't bring his cuff; we decided to check CXR (norm heart size, NAD) and BMet (ok x HCO3=36, Cr=1.4); we decided to incr Cardura to 8mg /d and f/u 37mo; get on diet & gety wt back down; he will call in the interim for  concerns.Marland KitchenMarland Kitchen  We reviewed prob list, meds, xrays and labs> see below for updates >>   CXR 1/16 showed norm heart size, clear hyperinflated lungs, stable mediastinal calcif, NAD...  LABS 1/16:  Chems- ok x HCO3=36 Cr=1.4.Marland KitchenMarland Kitchen PLAN>>  We decided to incr his Cardura to 8mg /d; he will continue the Hyzaar & monitor BP at home, ROV 37mo, call for questions...            Problem List:  HYPERTENSION (ICD-401.9) - controlled on HYZAAR 100/25 daily, & CARDURA 8mg - taking 1/2 daily... ~  2/13:  BP= 122/60 & he denies CP, palpit, SOB, edema, etc... ~  8/13:  BP= 140/80 & he remains largely asymptomatic... ~  CXR 1/14 showed normal heart size, ectatic & calcif Ao, low lying diaph, clear lungs, NAD... ~  EKG 1/14 showed NSR, rate61,  Poor R prog V1-3, NSSTTWA... ~  2/14: on Hyzaar100-12.5 & Cardura8; BP=138/80 today & denies CP, palpit, SOB, edema. ~  3/15: on Hyzaar100-12.5 & Cardura8-1/2; BP=120/58 today & he remains asymptomatic... ~  9/15: on Hyzaar100-12.5 & Cardura8-1/2; BP=110/70 today & denies CP, palpit, SOB, edema... ~  1/16: he presented w/ BP up to 200/100 at home; it was 132/84 here & we decided to incr Cardura to 8mg /d, recheck 30mo... ~  CXR 1/16 showed norm heart size, clear hyperinflated lungs, stable mediastinal calcif, NAD  ATHEROSCLEROTIC VASCULAR DISEASE (ICD-440.9) - on ASA 81mg /d;  Prev Plavix was discontinued after his subdural hematoma in 2006. ~  Eval 2003 by [facility] for TIA showed cerebrovasc dis w/ R>L PCA stenoses... ~  4/15: f/u CT Abd (f/u cryoablation of renal oncocytoma) also showed   HYPERLIPIDEMIA (ICD-272.4) - on SIMVASTATIN 40mg -taking 1/2 + FISH OIL 1000mg /d> doing well. ~  FLP 12/07 on Zocor showed TChol 137, HDL 47, LDL 70 ~  FLP 7/09 off med showed TChol 196, TG 95, HDL 49, LDL 128... rec> restart Zocor 40mg . ~  FLP 7/10 showed TChol 135, TG 47, HDL 53, LDL 73... He decr dose to 1/2 tab on his own... ~  [place] 8/11 showed TChol 138, TG 73, HDL 49, LDL 75 ~  FLP  8/12 showed TChol 125, TG 69, HDL 56, LDL 56 ~  FLP 2/13 showed TChol 133, TG 69, HDL 61, LDL 59 ~  FLP 8/13 showed TChol 137, TG 73, HDL 53, LDL 70 ~  FLP 8/14 on Simva20 showed  TChol 128, TG 74, HDL 48, LDL 66 ~  FLP 9/15 on Simva20 showed TChol 131, TG 72, HDL 50, LDL 67   MALIGNANT NEOPLASM OF THYROID GLAND (ICD-193) - he is s/p thyroidectomy for medullary carcinoma of the thyroid 6/02 by [facility]... ~  labs 7/09 showed TSH= 3.47... on SYNTHROID 260mcg/d ~  labs 7/10 showed TSH= 2.95 ~  labs 8/11 showed TSH= 1.20 ~  Labs 8/12 showed TSH= 0.23 & pt rec to decr the Synthroid200 to only 1/2 on MWF.Marland Kitchen. ~  Labs 2/13 showed TSH= 2.29 on Synthroid200- taking 1x4d=TThSS and 1/2 x3d=MWF.Marland Kitchen. ~  Labs 8/13 showed TSH= 0.69... On same dose. ~  Labs 8/14 on Synthroid200-taking 1 alt w/ 1/2 qod showed TSH=0.54, Free T4=1.28 ~  3/15: we discussed change in Synthroid to 161mcg/d... ~  Labs 9/15 on Synthroid150 showed TSH = 0.54 & FreeT3=3.0 (2.3-4.2) & FreeT4= 1.31 (0.6-1.60)  COLONIC POLYPS (ICD-211.3) - last colonoscopy 1/07 by DrSam showed divertics & hems only... f/u planned 14yrs.  BENIGN PROSTATIC HYPERTROPHY, WITH OBSTRUCTION (ICD-600.01) - he sees DrDavis for Urology once a year... he has a brother  who had prostate cancer...  ~  PSA followed by [id number] yrearly... 7/10 pt reports PSA>5 (was 4.17 here) & Rx w/ Cipro... ~  he had f/u Urology eval DrGrapey 5/11- pt indicates that his PSA was OK... ~  Labs here 8/13 showed PSA= 2.40  BILAT RENAL LESIONS >> found incidentally 6/13 on MRI of Lumbar Spine at [facility] office;  Bilat 10-11cm renal cysts noted plus a 2.5cm exophytic right renal lesion along the inferior pole; he was referred to DrGrapey who repeated renal imaging at his office> the right renal lesion seems to come off the wall of the cyst; they have decided to ask DrYamagata of IR to do a cyst asp & nodule bx => done 9/13 & prob oncocytic neoplasm w/ DDx betw oncocytoma & chromophobe  carcinoma, cyst fluid was neg; Urology tumor board favored observation for growth since he is asymptomatic, then consider cyroablation if it it growing; pt agreed to this plan & f/u CT planned for 1/14... ~  2/14:  followed by DrGrapey & DrYamagata re-imaged the lesion via CT 1/14 w/ growth evident & he has rec Cryoablation=> pending due to need for LLam first... ~  3/14:  he underwent cryoablation of right renal oncocytic tumor by DrYamagata (pt reports painful procedure) ~  5/14:  f/u visit w/ IR & CT Abd=> 2 left renal cysts & lower pole right renal cyst, cryoablation zone in inferior pole right kidney measures 4x3cm; atherosclerotic changes in Ao, mult splenic granulomata, prom CBD... ~  9/14: f/u visit w/ IR, DrYamagata> s/p percut cryoablation of right renal oncocytic tumor 3/14; doing well, CTshowed expected post ablation changes, no resid or recurrent tumor suspected...  ~  1/15: f/u visit w/ DrGrapey> doing well from the renal tumor standpoint, BPH, LTOS, ED, etc... ~  4/14: he had f/u DrYamagata> doing well, asymptomatic- BUN=17, Cr=1.21, f/u CT showed further retraction of right sided cryoablation defect, no abnormal enhancement, bilat renal cysts...0                       TRANSIENT ISCHEMIC ATTACK (ICD-435.9) - hx of recurrent right brain TIA's in 2003... initially on ASA + Plavix and the Plavix was stopped after his subdural in 2006...  Hx of SUBDURAL HEMATOMA (ICD-432.1) - hosp 7/06 w/ left subdural hematoma req craniotomy by DrHirsh for evacuation... no known trauma- he was on ASA/ Plavix and developed a headache... he also take DILANTIN 100mg  Tid now per [facility] (for a partial seizure characterized by aphasia)... ~  7/10: pt tells me he has decreased his Dilantin to Bid and will f/u w/ DrLove. ~  8/11:  we don't have any recent notes from [facility], but pt is off the Dilantin rx.  TREMOR (ICD-781.0) - he notes mild tremor right hand & arm> eval from Neurology- Gean Quint- indicated prob early  [name] and after a period of observation they started Mirapex (stopped due to hypotension), changed to [facility] 2/12> dosed per DrLove. ~  8/11: pt tells me that he has started accupuncture treatments in [place]==> no benefit. ~  2/12:  pt indicates that DrLove stopped the Mirapex in favor of SINEMET 25/100- taking 1/2 Tid. ~  8/12:  Pt tells me he has seen DrScott, Neurology at [facility] (?referred by [facility], ?second opinion), we don't have records, pt reports no change in meds (he considered entering a drug study). ~  He continues to f/u w/ DrLove every 4-76months... ~  8/13:  He indicates that [place] recently increased the  SINEMET 25/100 to 1 tab Tid... ~  8/14:  He had Neuro f/u DrAthar> note reviewed & pt stable on Sinemet Tid + Azilect 1mg /d (added 5/14 by Neuro)...  DEGENERATIVE JOINT DISEASE (ICD-715.90) - he notes some knee discomfort & he takes Aleve & HYDROCODONE as needed... LBP >> injured back lifting mattress 5/13; eval by DrBrooks & DrHirsh w/ abn MRI (done by Ortho at their office) & disc herniation L4-5 by report; Pred w/o help, ESI helped some, using pain Rx & holding off on surg per DrHirsh... ~  1/14: on Vicodin prn; eval by DrBrooks & DrHirsh w/ MRI reported to show HNP at L4-5 (w/ myelopathy & radiculopathy); DrHirsh did LumbarLam 1/14 w/ decompression L4 & L5 w/ discectomy=> improved...  Hx of CARCINOMA, SKIN, SQUAMOUS CELL (ICD-173.9) - s/p removal of skin cancer from his right forearm 11/06 by DrLupton... ~  9/15: he reports another skin cancer removed...  Health Maintenance:  he had PNEUMOVAX vaccine in 1998 & 1/11... gets yearly Flu vaccine every fall... TDAP given 8/11...   Past Surgical History  Procedure Laterality Date  . Cataract sugery    . Cholecystectomy    . Right inguinal hernia repair    . Thyroidectomy for medullary carcinoma    . Eye surgery    . Knee arthroscopy    . Appendectomy    . Tonsillectomy    . Lumbar laminectomy/decompression  microdiscectomy  05/14/2012    Procedure: LUMBAR LAMINECTOMY/DECOMPRESSION MICRODISCECTOMY 1 LEVEL;  Surgeon: [name], MD;  Location: [place] NEURO ORS;  Service: Neurosurgery;  Laterality: Left;  Left Lumbar four-five Laminectomy/Diskectomy/Far lateral diskectomy  . Kidney surgery  07-10-12  . Skin cancer removed  11/2013    Outpatient Encounter Prescriptions as of 04/26/2014  Medication Sig  . aspirin EC 81 MG tablet Take 81 mg by mouth daily.  . carbidopa-levodopa (SINEMET IR) 25-100 MG per tablet Take 1 tablet by mouth 4 (four) times daily. At 8, 12, 5 PM, and 9 or 10 PM  . doxazosin (CARDURA) 8 MG tablet TAKE 1/2 TABLET BY MOUTH AT BEDTIME.  Marland Kitchen levothyroxine (SYNTHROID, LEVOTHROID) 150 MCG tablet TAKE 1 TABLET ONE TIME DAILY BEFORE BREAKFAST  . losartan-hydrochlorothiazide (HYZAAR) 100-12.5 MG per tablet Take 1 tablet by mouth daily.  . Multiple Vitamin (MULTIVITAMIN WITH MINERALS) TABS Take 1 tablet by mouth daily.  . Probiotic Product (ALIGN) 4 MG CAPS Take 1 capsule by mouth daily.  . rasagiline (AZILECT) 1 MG TABS tablet Take 1 tablet (1 mg total) by mouth daily.  . simvastatin (ZOCOR) 40 MG tablet Take 20 mg by mouth every evening.    Not on File   Current Medications, Allergies, Past Medical History, Past Surgical History, Family History, and Social History were reviewed in [employer] record.    Review of Systems       See HPI - all other systems neg except as noted...       The patient complains of dyspnea on exertion, right hand tremor, and difficulty walking.  The patient denies anorexia, fever, weight loss, weight gain, vision loss, decreased hearing, hoarseness, chest pain, syncope, peripheral edema, prolonged cough, headaches, hemoptysis, abdominal pain, melena, hematochezia, severe indigestion/heartburn, hematuria, incontinence, muscle weakness, suspicious skin lesions, transient blindness, depression, unusual weight change, abnormal bleeding,  enlarged lymph nodes, and angioedema.     Objective:   Physical Exam     WD, WN, 81 y/o WM in NAD... GENERAL:  Alert & oriented; pleasant & cooperative... HEENT:  [place]/AT, EOM-wnl,  PERRLA, EACs-clear, TMs-wnl, NOSE-clear, THROAT-clear & wnl. NECK:  Supple w/ fairROM; no JVD; normal carotid impulses w/o bruits; thyroid area scar, no nodules palp; no lymphadenopathy. CHEST:  Clear to P & A; without wheezes/ rales/ or rhonchi heard... HEART:  Regular Rhythm; without murmurs/ rubs/ or gallops detected... ABDOMEN:  Soft & nontender; normal bowel sounds; no organomegaly or masses palpated... BACK:  Scar of LLam surg... EXT: without deformities, mild arthritic changes; no varicose veins/ +venous insuffic/ no edema. NEURO:  CN's intact;  mod tremor right hand & arm at rest, ?early mask facies, still moves well. DERM:  No lesions noted; no rash etc...  RADIOLOGY DATA:  Reviewed in the EPIC EMR & discussed w/ the patient...  LABORATORY DATA:  Reviewed in the EPIC EMR & discussed w/ the patient...   Assessment & Plan:    HBP>  He presented 1/16 c/o BP 200/100 at home but it was 132/84 here- we incr Cardura to 8mg /d, ROV 59mo...  PVD>  On ASA w/o cerebral ischemic symptoms, continue same...  HYPERLIPID>  On Simva20 + Fish Oil & FLPs have been at goal, continue same...  Hx Medullary Thyroid Carcinoma>  S/p surg by DrNewman2002, on Synthroid 150mg /d & TSH is ok on this dose...  GI> remote hx polyp, divertics, hems>  Last colon 2007 by by DrSam & suggested f/u 54yrs...  GU>  Followed by DrGrapey now, RENAL LESION found incidentally on MRI Lumbar spine & w/u revealed oncocytoma w/ cryoablation per [facility] 3/14...  NEURO> Hx TIA, Hx SDH, Tremor/ Parkinson's>  Followed by Gean Quint & now DrAthar on Sinemet & Azilect, improved...  DJD, LBP>  He had decompressive LLam 1/14 by DrHirsh & is recovering nicely...  Other medical issues as noted...   Patient's Medications  New Prescriptions   No  medications on file  Previous Medications   ACETAMINOPHEN 500 MG COAPSULE       ASPIRIN EC 81 MG TABLET    Take 81 mg by mouth daily.   DOXAZOSIN (CARDURA) 8 MG TABLET    TAKE 1 TABLET BY MOUTH AT BEDTIME.   LEVOTHYROXINE (SYNTHROID, LEVOTHROID) 150 MCG TABLET    TAKE 1 TABLET ONE TIME DAILY BEFORE BREAKFAST   LOSARTAN-HYDROCHLOROTHIAZIDE (HYZAAR) 100-12.5 MG PER TABLET    Take 1 tablet by mouth daily.   MULTIPLE VITAMIN (MULTIVITAMIN WITH MINERALS) TABS    Take 1 tablet by mouth daily.   PROBIOTIC PRODUCT (ALIGN) 4 MG CAPS    Take 1 capsule by mouth daily.   SIMVASTATIN (ZOCOR) 40 MG TABLET    Take 20 mg by mouth every evening.   UNABLE TO FIND    Triple antibiotic ointment   UNABLE TO FIND    Cetirizine-10mg   Modified Medications   Modified Medication Previous Medication   CARBIDOPA-LEVODOPA (SINEMET IR) 25-100 MG PER TABLET carbidopa-levodopa (SINEMET IR) 25-100 MG per tablet      Take 1 tablet by mouth 4 (four) times daily. At 8, 12, 5 PM, and 9 or 10 PM    Take 1 tablet by mouth 4 (four) times daily. At 8, 12, 5 PM, and 9 or 10 PM   RASAGILINE (AZILECT) 1 MG TABS TABLET rasagiline (AZILECT) 1 MG TABS tablet      Take 1 tablet (1 mg total) by mouth daily.    Take 1 tablet (1 mg total) by mouth daily.  Discontinued Medications   No medications on file

## 2014-04-28 NOTE — Progress Notes (Signed)
Quick Note:  Lm for pt to relay results. ______

## 2014-04-28 NOTE — Progress Notes (Signed)
Quick Note:  lmtcb X1 for pt. ______ 

## 2014-04-29 ENCOUNTER — Telehealth: Payer: Self-pay | Admitting: Pulmonary Disease

## 2014-04-29 ENCOUNTER — Ambulatory Visit (INDEPENDENT_AMBULATORY_CARE_PROVIDER_SITE_OTHER): Payer: Commercial Managed Care - HMO | Admitting: Neurology

## 2014-04-29 ENCOUNTER — Encounter: Payer: Self-pay | Admitting: Neurology

## 2014-04-29 VITALS — BP 80/49 | HR 54 | Temp 98.0°F | Ht 64.0 in | Wt 183.0 lb

## 2014-04-29 DIAGNOSIS — Z8679 Personal history of other diseases of the circulatory system: Secondary | ICD-10-CM

## 2014-04-29 DIAGNOSIS — Z8669 Personal history of other diseases of the nervous system and sense organs: Secondary | ICD-10-CM

## 2014-04-29 DIAGNOSIS — G20 Parkinson's disease: Secondary | ICD-10-CM

## 2014-04-29 DIAGNOSIS — Z8673 Personal history of transient ischemic attack (TIA), and cerebral infarction without residual deficits: Secondary | ICD-10-CM

## 2014-04-29 DIAGNOSIS — I959 Hypotension, unspecified: Secondary | ICD-10-CM

## 2014-04-29 DIAGNOSIS — Z87898 Personal history of other specified conditions: Secondary | ICD-10-CM

## 2014-04-29 MED ORDER — RASAGILINE MESYLATE 1 MG PO TABS: 1.0000 mg | ORAL_TABLET | Freq: Every day | ORAL | Status: DC

## 2014-04-29 MED ORDER — CARBIDOPA-LEVODOPA 25-100 MG PO TABS: 1.0000 | ORAL_TABLET | Freq: Four times a day (QID) | ORAL | Status: DC

## 2014-04-29 NOTE — Patient Instructions (Addendum)
We will keep your medications the same. Prescription can be filled at the [place]. You may use some half/half coffee in the morning.  Drink more water.  Stay active physically and mentally.  You are doing well.  We will do speech therapy.

## 2014-04-29 NOTE — Progress Notes (Signed)
Subjective:    Patient ID: William [name] is a 81 y.o. male.  HPI     Interim history:   William [name] is an 81 year old right-handed gentleman with an underlying medical history of right TIA in January 2003, SDH (s/p left craniotomy in August 2006), hypertension, hypothyroidism, thyroid cancer, partial onset seizures, off Dilantin, lumbar spine disease, status post lower back surgery at L4-5 in January 2014, renal tumor, status post kidney tumor ablative surgery in March 2014, who presents for followup consultation of his right-sided predominant [name]. He is accompanied by William [name] again today. I last saw him on 10/28/2013, at which time he reported doing well. In particular, had no cognitive complaints, no mood issues, and continued to play golf 3 times a week. He was driving well. I suggested an increase in his levodopa to 1 pill 4 times a day of the 25-100 milligrams strength. [facility] and had seen a [place] neurologist as well.   Today, he reports doing well, but gets fatigues and sleeps a lot, maybe 8-10 hours, with one episode of nocturia and also takes a nap during the day, about 1 1/2 to 2 hours. He reads the paper every day. His memory is good. He drives well, but did miss a turn twice last week, it was dark. His memory is good, his mood is good. But he does note that his blood pressure has been fluctuating. At times it is in the 200s over 100s. He has a dull headache sometimes when it goes up. Sometimes he feels lightheaded. William [name] states that she fell recently and broke her rib. He was quite anxious about her health at the time and she wonders if his blood pressure was rising because of his anxiety. She has done well since then. His blood pressure is low today but he does not have any significant symptoms. He does not drink caffeine typically and tries to stay well-hydrated. He walks regularly and plays golf. He has not fallen recently.   I  saw him on 12/07/2012, at which time I felt that he was doing well on Azilect and levodopa. He called in December 2014 with problems with blood pressure fluctuations. He was wondering if this came from the [place] but I did not think it was due to the Azilect per se. I was reluctant to take him off of it.    I first met him on 06/03/2012 and he previously followed by Dr. Morene Antu. He has had primarily right-sided symptoms with regards to his Parkinson's, diagnosed in 2010 with symptoms dating back to late 2009 or early 2010. He had briefly tried Mirapex but was taken off d/t hypotension. L spine MRI in June 2013 showed renal cysts, degenerative joint disease most prominent at L4-5. He tried acupuncture. His MRI of the lumbar spine showed abnormalities with his kidney with a cyst and a tumor and he had ablative surgery on 07/10/12 and had a FU CT done. He had lower back surgery on 05/14/12.   I saw him back on 09/03/2012 and I suggested starting Azilect. He has been tolerating it well and both he and his girlfriend felt that he did better with it in terms of dexterity and fine motor control. He had some hypotension and lightheadedness and reduced his BP medication. He has been having some issue with gout.   His Past Medical History Is Significant For: Past Medical History  Diagnosis Date  . Atherosclerotic vascular disease   .  Hyperlipidemia   . Hx of colonic polyps   . Benign prostatic hypertrophy with urinary obstruction   . TIA (transient ischemic attack)   . Subdural hematoma   . Tremor   . DJD (degenerative joint disease)   . Gout   . Malignant neoplasm of thyroid gland   . Primary skin squamous cell carcinoma   . Complication of anesthesia     "difficulty intubation, had to use fiberoptic 2006"  . Hypertension     sees [name]  . Thyroid disease   . Pneumonia     hx of in 1952  . Kidney tumor   . Parkinson disease   . Difficult intubation   . Hypothyroidism     His Past  Surgical History Is Significant For: Past Surgical History  Procedure Laterality Date  . Cataract sugery    . Cholecystectomy    . Right inguinal hernia repair    . Thyroidectomy for medullary carcinoma    . Eye surgery    . Knee arthroscopy    . Appendectomy    . Tonsillectomy    . Lumbar laminectomy/decompression microdiscectomy  05/14/2012    Procedure: LUMBAR LAMINECTOMY/DECOMPRESSION MICRODISCECTOMY 1 LEVEL;  Surgeon: [name], MD;  Location: [facility];  Service: Neurosurgery;  Laterality: Left;  Left Lumbar four-five Laminectomy/Diskectomy/Far lateral diskectomy  . Kidney surgery  07-10-12  . Skin cancer removed  11/2013    His Family History Is Significant For: Family History  Problem Relation Age of Onset  . Stroke Mother   . Alzheimer's disease Brother   . Alzheimer's disease Father   . Alzheimer's disease Sister     His Social History Is Significant For: History   Social History  . Marital Status: Widowed    Spouse Name: N/A    Number of Children: 3  . Years of Education: 15   Occupational History  . retired    Social History Main Topics  . Smoking status: Former Smoker -- 0.80 packs/day for 15 years    Quit date: 04/15/1965  . Smokeless tobacco: Never Used  . Alcohol Use: 0.0 oz/week    0 Not specified per week     Comment: 1 glass of wine every night  . Drug Use: No  . Sexual Activity: None   Other Topics Concern  . None   Social History Narrative   Patient consumes no caffeine    His Allergies Are:  No Known Allergies:   His Current Medications Are:  Outpatient Encounter Prescriptions as of 04/29/2014  Medication Sig  . Acetaminophen 500 MG coapsule   . aspirin EC 81 MG tablet Take 81 mg by mouth daily.  . carbidopa-levodopa (SINEMET IR) 25-100 MG per tablet Take 1 tablet by mouth 4 (four) times daily. At 8, 12, 5 PM, and 9 or 10 PM  . doxazosin (CARDURA) 8 MG tablet TAKE 1/2 TABLET BY MOUTH AT BEDTIME.  Marland Kitchen levothyroxine (SYNTHROID,  LEVOTHROID) 150 MCG tablet TAKE 1 TABLET ONE TIME DAILY BEFORE BREAKFAST  . losartan-hydrochlorothiazide (HYZAAR) 100-12.5 MG per tablet Take 1 tablet by mouth daily.  . Multiple Vitamin (MULTIVITAMIN WITH MINERALS) TABS Take 1 tablet by mouth daily.  . Probiotic Product (ALIGN) 4 MG CAPS Take 1 capsule by mouth daily.  . rasagiline (AZILECT) 1 MG TABS tablet Take 1 tablet (1 mg total) by mouth daily.  . simvastatin (ZOCOR) 40 MG tablet Take 20 mg by mouth every evening.  Marland Kitchen UNABLE TO FIND Triple antibiotic ointment  .  UNABLE TO FIND Cetirizine-67m  :  Review of Systems:  Out of a complete 14 point review of systems, all are reviewed and negative with the exception of these symptoms as listed below:   Review of Systems  Musculoskeletal: Positive for joint swelling.  Neurological: Positive for tremors.    Objective:  Neurologic Exam  Physical Exam Physical Examination:   Filed Vitals:   04/29/14 1055  BP: 80/49  Pulse: 54  Temp: 98 F (36.7 C)   General Examination: The patient is a very pleasant 81y.o. male in no acute distress. He has no dizziness or lightheadedness upon standing.  HEENT: He has moderate degree of nuchal rigidity with some decrease in passive range of motion. He reports no neck pain. Face is symmetric with decrease in facial animation. Speech is mildly low in volume but not dysarthric. Hearing is grossly intact. Extraocular tracking is fairly good with mild saccadic breakdown. Pupils are reactive to light. Airway is clear with mild mouth dryness noted.  Chest is clear to auscultation without wheezing, or rhonchi or crackles noted.  Heart sounds are normal without murmurs, rubs or gallops.  Abdomen is soft, nontender with normal bowel sounds.  He has no pitting edema in the distal lower extremities.  Skin is warm and dry.  Neurologically: Mental status: The patient is awake, alert and oriented in all 4 spheres. His memory, attention, language and knowledge are  appropriate. He has no aphasia, agnosia, apraxia or anomia. Cranial nerves are as described under HEENT exam. Motor exam: Normal bulk and strength is noted. He has increased tone in the right upper extremity and to a lesser degree in the right lower extremity. Mild cogwheeling is noted. He has a moderate and intermittent resting tremor in the right upper extremity only. He has no other tremor. He has no significant action or postural tremor today.  Fine motor skills with finger taps and hand movements are moderately impaired on the right upper extremity and otherwise mildly impaired in the right lower extremity and mildly impaired on the left side. He stands up fairly well from the seated position and his posture is age-appropriate to mildly stooped for age, unchanged . He walks with decreased arm swing on the right and turns fairly well. Balance is preserved. Sensory exam is intact to light touch, pinprick, vibration and temperature. Romberg is negative. Reflexes are 1+.   Assessment and Plan:   In summary, William [name] a very pleasant 81year old gentleman with right-sided predominant, tremor predominant [name], in its 6th year. He has a complex history and is status post lower back surgery at level L4/5 in January 2014, and kidney tumor ablative surgery in March 2014. He has done well after his surgeries and has had physical therapy and continues to stay active physically and mentally. He continues to take Azilect once daily and generic Sinemet 4 times a day. He is requesting 90 day prescriptions for his medications [facility] [place] [facility] [place] provided these for him today. Overall he has done well. He has had some recent blood pressure fluctuations for which I have asked him to continue to monitor symptoms and his blood pressure. He recently saw his primary care physician as well. He had a chest x-ray which I reviewed. His exam has remained fairly stable. I would like to see  him back in 6 months, sooner if the need arises. I have asked that they call with any interim questions, concerns, refill requests  or updates.  I answered all his questions today and they were in agreement.

## 2014-04-29 NOTE — Telephone Encounter (Signed)
Called and spoke with pt and he is aware of lab and cxr results per SN.  Pt voiced his understanding and nothing further is needed. 

## 2014-04-30 ENCOUNTER — Other Ambulatory Visit: Payer: Self-pay | Admitting: Pulmonary Disease

## 2014-05-03 ENCOUNTER — Other Ambulatory Visit: Payer: Self-pay | Admitting: Neurology

## 2014-05-03 DIAGNOSIS — G20 Parkinson's disease: Secondary | ICD-10-CM

## 2014-05-03 NOTE — Telephone Encounter (Signed)
Patient stated Rx's for rasagiline (AZILECT) 1 MG TABS tablet and  carbidopa-levodopa (SINEMET IR) 25-100 MG per tablet, need to be faxed to [facility] @ [phone], [address], attention Dr. Felton [facility].  Please call and advise.

## 2014-05-04 MED ORDER — CARBIDOPA-LEVODOPA 25-100 MG PO TABS: 1.0000 | ORAL_TABLET | Freq: Four times a day (QID) | ORAL | Status: DC

## 2014-05-04 MED ORDER — RASAGILINE MESYLATE 1 MG PO TABS: 1.0000 mg | ORAL_TABLET | Freq: Every day | ORAL | Status: DC

## 2014-05-04 NOTE — Telephone Encounter (Signed)
Rx signed and faxed.

## 2014-05-16 ENCOUNTER — Telehealth: Payer: Self-pay | Admitting: Pulmonary Disease

## 2014-05-16 NOTE — Telephone Encounter (Signed)
I have looked into this. Pt does not have the [place], therefore pt does not need a referral. I have called the patient and informed them of this. Nothing further was needed.   If patient had any trouble, they were to call our office back.

## 2014-05-16 NOTE — Telephone Encounter (Signed)
[phone] calling back

## 2014-05-16 NOTE — Telephone Encounter (Signed)
lmtcb for pt.  

## 2014-06-07 ENCOUNTER — Ambulatory Visit: Payer: Commercial Managed Care - HMO | Attending: Neurology | Admitting: Speech Pathology

## 2014-06-07 DIAGNOSIS — Z85850 Personal history of malignant neoplasm of thyroid: Secondary | ICD-10-CM | POA: Diagnosis not present

## 2014-06-07 DIAGNOSIS — I10 Essential (primary) hypertension: Secondary | ICD-10-CM | POA: Diagnosis not present

## 2014-06-07 DIAGNOSIS — R490 Dysphonia: Secondary | ICD-10-CM | POA: Diagnosis not present

## 2014-06-07 DIAGNOSIS — I7090 Unspecified atherosclerosis: Secondary | ICD-10-CM | POA: Diagnosis not present

## 2014-06-07 DIAGNOSIS — G20 Parkinson's disease: Secondary | ICD-10-CM | POA: Insufficient documentation

## 2014-06-07 DIAGNOSIS — M1990 Unspecified osteoarthritis, unspecified site: Secondary | ICD-10-CM | POA: Diagnosis not present

## 2014-06-07 DIAGNOSIS — E890 Postprocedural hypothyroidism: Secondary | ICD-10-CM | POA: Insufficient documentation

## 2014-06-07 DIAGNOSIS — Z8673 Personal history of transient ischemic attack (TIA), and cerebral infarction without residual deficits: Secondary | ICD-10-CM | POA: Insufficient documentation

## 2014-06-07 DIAGNOSIS — E785 Hyperlipidemia, unspecified: Secondary | ICD-10-CM | POA: Insufficient documentation

## 2014-06-07 DIAGNOSIS — Z8639 Personal history of other endocrine, nutritional and metabolic disease: Secondary | ICD-10-CM | POA: Diagnosis not present

## 2014-06-07 NOTE — Patient Instructions (Addendum)
Speech exercises - do 5x each, x2-3/day SLOW BIG  SAY THE FOLLOWING- make every sound! Red leather, yellow leather  Big grocery buggy    Purple baby carriage    [facility] Proper copper coffee pot Ripe purple cabbage Three free throws [employer], Blue Bulb Flash Message Dave dipped the dessert  [employer] [place] An Chief Financial Officer for [employer] Five valve levers Six Thick Thistles Stick Double Bubble Gum Fat cows give milk [employer] Gophers Fat frogs flip freely Kohl's into bed Get that game to [employer] Fish Cinnamon aluminum linoleum Black bugs blood Lovely lemon linament Buckle that bracket    [employer] of [employer] shrink, shells shouldn't [place] 49ers Take the tackle box Give me five flapjacks Fundamental relatives Call the cat "Buttercup" A calendar of [place] Four floors to cover Yellow oil ointment Fellow lovers of felines Catastrophe in [place]' plums The church's chimes chimed Telling time until eleven Unique [place] A Three Toed Tree Toad Knapsack Strap Snap  Biotene SPRAY, Biotene GEL  BREATHE, [place]!

## 2014-06-07 NOTE — Therapy (Signed)
[facility] [address] [place], [name], [address] Phone: [phone]   Fax:  [phone]  Speech Language Pathology Evaluation  Patient Details  Name: William [name] MRN: [id number] Date of Birth: Nov 15, 1928 Referring Provider:  Star Age, MD  Encounter Date: 06/07/2014      End of Session - 06/07/14 1005    Visit Number 1   Number of Visits 0   SLP Start Time 0927   SLP Stop Time  1006   SLP Time Calculation (min) 39 min   Activity Tolerance Patient tolerated treatment well      Past Medical History  Diagnosis Date  . Atherosclerotic vascular disease   . Hyperlipidemia   . Hx of colonic polyps   . Benign prostatic hypertrophy with urinary obstruction   . TIA (transient ischemic attack)   . Subdural hematoma   . Tremor   . DJD (degenerative joint disease)   . Gout   . Malignant neoplasm of thyroid gland   . Primary skin squamous cell carcinoma   . Complication of anesthesia     "difficulty intubation, had to use fiberoptic 2006"  . Hypertension     sees [name]  . Thyroid disease   . Pneumonia     hx of in 1952  . Kidney tumor   . Parkinson disease   . Difficult intubation   . Hypothyroidism     Past Surgical History  Procedure Laterality Date  . Cataract sugery    . Cholecystectomy    . Right inguinal hernia repair    . Thyroidectomy for medullary carcinoma    . Eye surgery    . Knee arthroscopy    . Appendectomy    . Tonsillectomy    . Lumbar laminectomy/decompression microdiscectomy  05/14/2012    Procedure: LUMBAR LAMINECTOMY/DECOMPRESSION MICRODISCECTOMY 1 LEVEL;  Surgeon: [name], MD;  Location: [facility];  Service: Neurosurgery;  Laterality: Left;  Left Lumbar four-five Laminectomy/Diskectomy/Far lateral diskectomy  . Kidney surgery  07-10-12  . Skin cancer removed  11/2013    There were no vitals taken for this visit.  Visit Diagnosis: Hypokinetic  Parkinsonian dysphonia      Subjective Assessment - 06/07/14 0937    Symptoms "My speech has become slurred and hoarse"   Currently in Pain? No/denies          SLP Evaluation [facility] - 06/07/14 7517    SLP Visit Information   SLP Received On 06/07/14   Onset Date PD dx 6 years ago   General Information   HPI PD dx 6 years ago. He denies friends/family request him to repeat himself. Pt  plays golf 3 days a week. Denies falls, loss of balance. He report increased difficulty putting his pants on while standing up. William [name] also reports his handwriting is good and without change.  I provded information on our Power over Parkinsons and PD exercise classes.    Mobility Status walks independenlty   Prior Functional Status   Cognitive/Linguistic Baseline Within functional limits   Type of Home House    Lives With Significant other   Available Support Family   Vocation Retired   Oral Motor/Sensory Function   Overall Oral Motor/Sensory Function Appears within functional limits for tasks assessed   Motor Speech   Overall Motor Speech Appears within functional limits for tasks assessed   Respiration Within functional limits   Phonation Hoarse   Resonance Within functional limits   Articulation Within  functional limitis   Intelligibility Intelligible   Motor Planning Witnin functional limits   Motor Speech Errors Aware           ADULT SLP TREATMENT - 2014/07/07 1014    Cognitive-Linquistic Treatment   Skilled Treatment Pt trained in speech exercises for overarticulation and breathing at home. Pt demonstrated these with rare min A. Pt verbalized strategies of breathing more frequently during conversation and thinking loud and big when talking.           SLP Education - 07-07-2014 1004    Education provided Yes   Education Details Compensations, speech exercises, PD exercise classes, screen in 6 months   Person(s) Educated Patient   Methods Explanation;Demonstration;Handout           SLP Short Term Goals - 07/07/14 1012    SLP SHORT TERM GOAL #1   Title N/A          SLP Long Term Goals - 07-07-2014 1012    SLP LONG TERM GOAL #1   Title N/A          Plan - July 07, 2014 1006    Clinical Impression Statement Pt presents with slight hoarse voice, slight inconsistent slur, however pt 100% intelligible at conversation level. Average dB in conversation is 72dB, measured on sound level meter 30 cm from speaker, which is [facility]. Sustained /a/ average is 84 to 82 DB, also WFL. Pt denies family and friends asking for repetition. He also denies difficulty being understood in noisy environments such as restaurants. I provided speech exercises for overarticulation and breathing for pt to do at home. William [name] also denies difficulty swallowing meds or meals. At this time, I do not reocmmend skilled ST. I recommend William [name] complete speech exercises at home and return in 6 months for PD screening by PT/OT/ST.    Speech Therapy Frequency One time visit   Potential to Achieve Goals Good   Consulted and Agree with Plan of Care Patient          G-Codes - 07/07/2014 1013    Functional Assessment Tool Used NOMS   Functional Limitations Motor speech   Motor Speech Current Status [phone]) 0 percent impaired, limited or restricted   Motor Speech Goal Status (I6270) 0 percent impaired, limited or restricted   Motor Speech Goal Status (J5009) 0 percent impaired, limited or restricted      Problem List Patient Active Problem List   Diagnosis Date Noted  . Diverticulosis of colon without hemorrhage 06/16/2013  . Post-operative hypothyroidism 06/16/2013  . Renal oncocytoma 06/09/2012  . LBP (low back pain) 06/09/2012  . Parkinson disease, symptomatic 06/06/2011  . CARCINOMA, SKIN, SQUAMOUS CELL 12/10/2007  . MALIGNANT NEOPLASM OF THYROID GLAND 12/10/2007  . SUBDURAL HEMATOMA 12/10/2007  . BPH (benign prostatic hypertrophy) 12/10/2007  . HYPERLIPIDEMIA 04/22/2007  .  Essential hypertension 04/22/2007  . Transient cerebral ischemia 04/22/2007  . Generalized atherosclerosis 04/22/2007  . DEGENERATIVE JOINT DISEASE 04/22/2007    Boss Danielsen, Annye Rusk, SLP 07/07/14, 10:15 AM  [facility] [address] [place], [name], [address] Phone: [phone]   Fax:  [phone]

## 2014-06-20 ENCOUNTER — Other Ambulatory Visit (HOSPITAL_COMMUNITY): Payer: Self-pay | Admitting: Interventional Radiology

## 2014-06-20 DIAGNOSIS — N2889 Other specified disorders of kidney and ureter: Secondary | ICD-10-CM

## 2014-06-21 ENCOUNTER — Ambulatory Visit: Payer: Commercial Managed Care - HMO | Admitting: Pulmonary Disease

## 2014-06-21 ENCOUNTER — Other Ambulatory Visit: Payer: Self-pay | Admitting: Radiology

## 2014-06-21 DIAGNOSIS — N2889 Other specified disorders of kidney and ureter: Secondary | ICD-10-CM

## 2014-07-14 ENCOUNTER — Other Ambulatory Visit: Payer: Self-pay | Admitting: Pulmonary Disease

## 2014-07-15 ENCOUNTER — Other Ambulatory Visit: Payer: Self-pay | Admitting: Pulmonary Disease

## 2014-07-16 LAB — CREATININE WITH EST GFR
Creat: 1.31 mg/dL (ref 0.50–1.35)
GFR, EST AFRICAN AMERICAN: 57 mL/min — AB
GFR, Est Non African American: 50 mL/min — ABNORMAL LOW

## 2014-07-16 LAB — BUN: BUN: 21 mg/dL (ref 6–23)

## 2014-07-17 ENCOUNTER — Encounter (HOSPITAL_COMMUNITY): Payer: Self-pay | Admitting: Emergency Medicine

## 2014-07-17 ENCOUNTER — Emergency Department (INDEPENDENT_AMBULATORY_CARE_PROVIDER_SITE_OTHER)
Admission: EM | Admit: 2014-07-17 | Discharge: 2014-07-17 | Disposition: A | Discharge status: Hospice | Payer: Commercial Managed Care - HMO | Source: Home / Self Care | Attending: Emergency Medicine | Admitting: Emergency Medicine

## 2014-07-17 ENCOUNTER — Observation Stay (HOSPITAL_COMMUNITY)
Admission: EM | Admit: 2014-07-17 | Discharge: 2014-07-18 | Disposition: A | Discharge status: Home / Self Care | Payer: Commercial Managed Care - HMO | Attending: Internal Medicine | Admitting: Internal Medicine

## 2014-07-17 ENCOUNTER — Emergency Department (HOSPITAL_COMMUNITY): Payer: Commercial Managed Care - HMO

## 2014-07-17 DIAGNOSIS — N400 Enlarged prostate without lower urinary tract symptoms: Secondary | ICD-10-CM | POA: Diagnosis not present

## 2014-07-17 DIAGNOSIS — Z85828 Personal history of other malignant neoplasm of skin: Secondary | ICD-10-CM | POA: Insufficient documentation

## 2014-07-17 DIAGNOSIS — Z85850 Personal history of malignant neoplasm of thyroid: Secondary | ICD-10-CM | POA: Insufficient documentation

## 2014-07-17 DIAGNOSIS — I6200 Nontraumatic subdural hemorrhage, unspecified: Secondary | ICD-10-CM | POA: Diagnosis present

## 2014-07-17 DIAGNOSIS — N189 Chronic kidney disease, unspecified: Secondary | ICD-10-CM

## 2014-07-17 DIAGNOSIS — Z8669 Personal history of other diseases of the nervous system and sense organs: Secondary | ICD-10-CM | POA: Diagnosis not present

## 2014-07-17 DIAGNOSIS — Z9849 Cataract extraction status, unspecified eye: Secondary | ICD-10-CM | POA: Diagnosis not present

## 2014-07-17 DIAGNOSIS — Z9049 Acquired absence of other specified parts of digestive tract: Secondary | ICD-10-CM | POA: Insufficient documentation

## 2014-07-17 DIAGNOSIS — R0602 Shortness of breath: Secondary | ICD-10-CM

## 2014-07-17 DIAGNOSIS — Z86010 Personal history of colonic polyps: Secondary | ICD-10-CM | POA: Diagnosis not present

## 2014-07-17 DIAGNOSIS — M1990 Unspecified osteoarthritis, unspecified site: Secondary | ICD-10-CM | POA: Diagnosis not present

## 2014-07-17 DIAGNOSIS — F1099 Alcohol use, unspecified with unspecified alcohol-induced disorder: Secondary | ICD-10-CM | POA: Diagnosis not present

## 2014-07-17 DIAGNOSIS — Z87891 Personal history of nicotine dependence: Secondary | ICD-10-CM | POA: Insufficient documentation

## 2014-07-17 DIAGNOSIS — I129 Hypertensive chronic kidney disease with stage 1 through stage 4 chronic kidney disease, or unspecified chronic kidney disease: Secondary | ICD-10-CM | POA: Diagnosis not present

## 2014-07-17 DIAGNOSIS — R778 Other specified abnormalities of plasma proteins: Secondary | ICD-10-CM | POA: Diagnosis present

## 2014-07-17 DIAGNOSIS — Z8701 Personal history of pneumonia (recurrent): Secondary | ICD-10-CM | POA: Insufficient documentation

## 2014-07-17 DIAGNOSIS — I480 Paroxysmal atrial fibrillation: Secondary | ICD-10-CM | POA: Diagnosis not present

## 2014-07-17 DIAGNOSIS — I4891 Unspecified atrial fibrillation: Secondary | ICD-10-CM

## 2014-07-17 DIAGNOSIS — Z7982 Long term (current) use of aspirin: Secondary | ICD-10-CM | POA: Diagnosis not present

## 2014-07-17 DIAGNOSIS — I10 Essential (primary) hypertension: Secondary | ICD-10-CM | POA: Diagnosis not present

## 2014-07-17 DIAGNOSIS — M109 Gout, unspecified: Secondary | ICD-10-CM | POA: Insufficient documentation

## 2014-07-17 DIAGNOSIS — I4510 Unspecified right bundle-branch block: Secondary | ICD-10-CM | POA: Insufficient documentation

## 2014-07-17 DIAGNOSIS — N183 Chronic kidney disease, stage 3 (moderate): Secondary | ICD-10-CM | POA: Diagnosis not present

## 2014-07-17 DIAGNOSIS — E039 Hypothyroidism, unspecified: Secondary | ICD-10-CM | POA: Insufficient documentation

## 2014-07-17 DIAGNOSIS — G20 Parkinson's disease: Secondary | ICD-10-CM

## 2014-07-17 DIAGNOSIS — E785 Hyperlipidemia, unspecified: Secondary | ICD-10-CM | POA: Diagnosis not present

## 2014-07-17 DIAGNOSIS — Z8673 Personal history of transient ischemic attack (TIA), and cerebral infarction without residual deficits: Secondary | ICD-10-CM | POA: Insufficient documentation

## 2014-07-17 DIAGNOSIS — R7989 Other specified abnormal findings of blood chemistry: Secondary | ICD-10-CM | POA: Diagnosis not present

## 2014-07-17 LAB — CBC WITH DIFFERENTIAL/PLATELET
Basophils Absolute: 0 10*3/uL (ref 0.0–0.1)
Basophils Relative: 1 % (ref 0–1)
EOS ABS: 0.1 10*3/uL (ref 0.0–0.7)
Eosinophils Relative: 1 % (ref 0–5)
HCT: 48.3 % (ref 39.0–52.0)
Hemoglobin: 15.7 g/dL (ref 13.0–17.0)
LYMPHS PCT: 16 % (ref 12–46)
Lymphs Abs: 0.9 10*3/uL (ref 0.7–4.0)
MCH: 29.8 pg (ref 26.0–34.0)
MCHC: 32.5 g/dL (ref 30.0–36.0)
MCV: 91.8 fL (ref 78.0–100.0)
Monocytes Absolute: 0.5 10*3/uL (ref 0.1–1.0)
Monocytes Relative: 10 % (ref 3–12)
NEUTROS ABS: 4 10*3/uL (ref 1.7–7.7)
Neutrophils Relative %: 72 % (ref 43–77)
Platelets: 142 10*3/uL — ABNORMAL LOW (ref 150–400)
RBC: 5.26 MIL/uL (ref 4.22–5.81)
RDW: 14 % (ref 11.5–15.5)
WBC: 5.5 10*3/uL (ref 4.0–10.5)

## 2014-07-17 LAB — BASIC METABOLIC PANEL
Anion gap: 9 (ref 5–15)
BUN: 18 mg/dL (ref 6–23)
CO2: 31 mmol/L (ref 19–32)
Calcium: 8.4 mg/dL (ref 8.4–10.5)
Chloride: 100 mmol/L (ref 96–112)
Creatinine, Ser: 1.48 mg/dL — ABNORMAL HIGH (ref 0.50–1.35)
GFR calc Af Amer: 48 mL/min — ABNORMAL LOW (ref >90)
GFR calc non Af Amer: 42 mL/min — ABNORMAL LOW (ref >90)
GLUCOSE: 103 mg/dL — AB (ref 70–99)
Potassium: 3.9 mmol/L (ref 3.5–5.1)
Sodium: 140 mmol/L (ref 135–145)

## 2014-07-17 LAB — TROPONIN I
TROPONIN I: 0.29 ng/mL — AB (ref <0.031)
Troponin I: 0.06 ng/mL — ABNORMAL HIGH (ref <0.031)

## 2014-07-17 LAB — TSH: TSH: 0.532 u[IU]/mL (ref 0.350–4.500)

## 2014-07-17 LAB — PROTIME-INR
INR: 1.13 (ref 0.00–1.49)
INR: 1.19 (ref 0.00–1.49)
Prothrombin Time: 14.7 seconds (ref 11.6–15.2)
Prothrombin Time: 15.2 seconds (ref 11.6–15.2)

## 2014-07-17 LAB — APTT: aPTT: 30 seconds (ref 24–37)

## 2014-07-17 MED ORDER — SODIUM CHLORIDE 0.9 % IV SOLN
INTRAVENOUS | Status: DC
  Administered 2014-07-17: 14:00:00 via INTRAVENOUS

## 2014-07-17 MED ORDER — DILTIAZEM HCL 100 MG IV SOLR
5.0000 mg/h | INTRAVENOUS | Status: DC
  Administered 2014-07-17: 5 mg/h via INTRAVENOUS
  Filled 2014-07-17 (×2): qty 100

## 2014-07-17 MED ORDER — SIMVASTATIN 20 MG PO TABS
20.0000 mg | ORAL_TABLET | Freq: Every evening | ORAL | Status: DC
  Administered 2014-07-17: 20 mg via ORAL
  Filled 2014-07-17 (×2): qty 1

## 2014-07-17 MED ORDER — SODIUM CHLORIDE 0.9 % IJ SOLN: 3.0000 mL | Freq: Two times a day (BID) | INTRAMUSCULAR | Status: DC

## 2014-07-17 MED ORDER — CARBIDOPA-LEVODOPA 25-100 MG PO TABS
1.0000 | ORAL_TABLET | Freq: Four times a day (QID) | ORAL | Status: DC
  Administered 2014-07-17 – 2014-07-18 (×2): 1 via ORAL
  Filled 2014-07-17 (×5): qty 1

## 2014-07-17 MED ORDER — RIVAROXABAN 20 MG PO TABS
20.0000 mg | ORAL_TABLET | Freq: Every day | ORAL | Status: DC
  Administered 2014-07-17: 20 mg via ORAL
  Filled 2014-07-17 (×2): qty 1

## 2014-07-17 MED ORDER — DOXAZOSIN MESYLATE 8 MG PO TABS
8.0000 mg | ORAL_TABLET | Freq: Every day | ORAL | Status: DC
  Administered 2014-07-18: 8 mg via ORAL
  Filled 2014-07-17: qty 1

## 2014-07-17 MED ORDER — RASAGILINE MESYLATE 1 MG PO TABS
1.0000 mg | ORAL_TABLET | Freq: Every day | ORAL | Status: DC
  Administered 2014-07-18: 1 mg via ORAL
  Filled 2014-07-17: qty 1

## 2014-07-17 MED ORDER — ONDANSETRON HCL 4 MG/2ML IJ SOLN: 4.0000 mg | Freq: Four times a day (QID) | INTRAMUSCULAR | Status: DC | PRN

## 2014-07-17 MED ORDER — LEVOTHYROXINE SODIUM 150 MCG PO TABS
150.0000 ug | ORAL_TABLET | Freq: Every day | ORAL | Status: DC
  Administered 2014-07-18: 150 ug via ORAL
  Filled 2014-07-17 (×2): qty 1

## 2014-07-17 MED ORDER — ACETAMINOPHEN 325 MG PO TABS: 650.0000 mg | ORAL_TABLET | Freq: Four times a day (QID) | ORAL | Status: DC | PRN

## 2014-07-17 MED ORDER — SODIUM CHLORIDE 0.9 % IV SOLN
INTRAVENOUS | Status: DC
  Administered 2014-07-17: 19:00:00 via INTRAVENOUS

## 2014-07-17 MED ORDER — MORPHINE SULFATE 2 MG/ML IJ SOLN: 1.0000 mg | INTRAMUSCULAR | Status: DC | PRN

## 2014-07-17 MED ORDER — HYDROCODONE-ACETAMINOPHEN 5-325 MG PO TABS: 1.0000 | ORAL_TABLET | ORAL | Status: DC | PRN

## 2014-07-17 MED ORDER — HEPARIN SODIUM (PORCINE) 5000 UNIT/ML IJ SOLN: 5000.0000 [IU] | Freq: Three times a day (TID) | INTRAMUSCULAR | Status: DC

## 2014-07-17 MED ORDER — DILTIAZEM LOAD VIA INFUSION
10.0000 mg | Freq: Once | INTRAVENOUS | Status: AC
  Administered 2014-07-17: 10 mg via INTRAVENOUS
  Filled 2014-07-17: qty 10

## 2014-07-17 MED ORDER — ACETAMINOPHEN 650 MG RE SUPP: 650.0000 mg | Freq: Four times a day (QID) | RECTAL | Status: DC | PRN

## 2014-07-17 MED ORDER — ONDANSETRON HCL 4 MG PO TABS: 4.0000 mg | ORAL_TABLET | Freq: Four times a day (QID) | ORAL | Status: DC | PRN

## 2014-07-17 NOTE — ED Notes (Signed)
C/o tachycardia See provider notes

## 2014-07-17 NOTE — ED Provider Notes (Signed)
CSN: [id number]     Arrival date & time 07/17/14  1207 History   First MD Initiated Contact with Patient 07/17/14 1216     Chief Complaint  Patient presents with  . Tachycardia   (Consider location/radiation/quality/duration/timing/severity/associated sxs/prior Treatment) HPI  He is an 81 year old man here with his wife for evaluation of feeling off. He states he woke up this morning and just didn't feel right. He tried eating breakfast and laying back down, but continued to feel off. He denies any chest pain. He does report some palpitations. No shortness of breath or diaphoresis. He does report some intermittent dizziness, but states this is not unusual for him. He states he took all of his medications this morning, except his blood pressure pill because he has been having fluctuations in his blood pressure for the last several weeks.  Past Medical History  Diagnosis Date  . Atherosclerotic vascular disease   . Hyperlipidemia   . Hx of colonic polyps   . Benign prostatic hypertrophy with urinary obstruction   . TIA (transient ischemic attack)   . Subdural hematoma   . Tremor   . DJD (degenerative joint disease)   . Gout   . Malignant neoplasm of thyroid gland   . Primary skin squamous cell carcinoma   . Complication of anesthesia     "difficulty intubation, had to use fiberoptic 2006"  . Hypertension     sees [name]  . Thyroid disease   . Pneumonia     hx of in 1952  . Kidney tumor   . Parkinson disease   . Difficult intubation   . Hypothyroidism    Past Surgical History  Procedure Laterality Date  . Cataract sugery    . Cholecystectomy    . Right inguinal hernia repair    . Thyroidectomy for medullary carcinoma    . Eye surgery    . Knee arthroscopy    . Appendectomy    . Tonsillectomy    . Lumbar laminectomy/decompression microdiscectomy  05/14/2012    Procedure: LUMBAR LAMINECTOMY/DECOMPRESSION MICRODISCECTOMY 1 LEVEL;  Surgeon: [name], MD;   Location: [facility];  Service: Neurosurgery;  Laterality: Left;  Left Lumbar four-five Laminectomy/Diskectomy/Far lateral diskectomy  . Kidney surgery  07-10-12  . Skin cancer removed  11/2013   Family History  Problem Relation Age of Onset  . Stroke Mother   . Alzheimer's disease Brother   . Alzheimer's disease Father   . Alzheimer's disease Sister    History  Substance Use Topics  . Smoking status: Former Smoker -- 0.80 packs/day for 15 years    Quit date: 04/15/1965  . Smokeless tobacco: Never Used  . Alcohol Use: 0.0 oz/week    0 Standard drinks or equivalent per week     Comment: 1 glass of wine every night    Review of Systems  Constitutional: Negative for fever and chills.  HENT: Negative.   Respiratory: Negative for shortness of breath.   Cardiovascular: Positive for palpitations. Negative for chest pain.  Gastrointestinal: Negative.   Neurological: Positive for dizziness. Negative for syncope.    Allergies  Review of patient's allergies indicates no known allergies.  Home Medications   Prior to Admission medications   Medication Sig Start Date End Date Taking? Authorizing Provider  Acetaminophen 500 MG coapsule  02/14/14   Historical Provider, MD  aspirin EC 81 MG tablet Take 81 mg by mouth daily.    Historical Provider, MD  carbidopa-levodopa (SINEMET IR) 25-100 MG  per tablet Take 1 tablet by mouth 4 (four) times daily. At 8, 12, 5 PM, and 9 or 10 PM 05/04/14   Star Age, MD  doxazosin (CARDURA) 8 MG tablet TAKE 1/2 TABLET BY MOUTH AT BEDTIME. 03/04/14   [name], MD  levothyroxine (SYNTHROID, LEVOTHROID) 150 MCG tablet TAKE 1 TABLET ONE TIME DAILY BEFORE BREAKFAST 07/15/14   [name], MD  losartan-hydrochlorothiazide (HYZAAR) 100-12.5 MG per tablet TAKE 1 TABLET BY MOUTH DAILY. 05/02/14   [name], MD  Multiple Vitamin (MULTIVITAMIN WITH MINERALS) TABS Take 1 tablet by mouth daily.    Historical Provider, MD  Probiotic Product (ALIGN) 4 MG CAPS Take  1 capsule by mouth daily.    Historical Provider, MD  rasagiline (AZILECT) 1 MG TABS tablet Take 1 tablet (1 mg total) by mouth daily. 05/04/14   Star Age, MD  simvastatin (ZOCOR) 40 MG tablet TAKE 1/2 TABLET BY MOUTH EVERY EVENING 07/15/14   [name], MD  UNABLE TO FIND Triple antibiotic ointment 02/14/14   Historical Provider, MD  UNABLE TO FIND Cetirizine-10mg  02/14/14   Historical Provider, MD   BP 92/58 mmHg  Pulse 64  Temp(Src) 96.8 F (36 C) (Oral)  Resp 18  SpO2 100% Physical Exam  Constitutional: He is oriented to person, place, and time. He appears well-developed and well-nourished. No distress.  Neck: Neck supple.  Cardiovascular: S1 normal and S2 normal.  An irregularly irregular rhythm present. Tachycardia present.   No murmur heard. Pulmonary/Chest: Effort normal and breath sounds normal. No respiratory distress. He has no wheezes. He has no rales.  Neurological: He is alert and oriented to person, place, and time.    ED Course  Procedures (including critical care time) ED ECG REPORT   Date: 07/17/2014  Rate: 134  Rhythm: a fib with RVR  QRS Axis: right  Intervals: a fib  ST/T Wave abnormalities: indeterminate  Conduction Disutrbances:right bundle branch block  Narrative Interpretation: A fib with RVR and new RBBB  Old EKG Reviewed: changes noted and new right bundle branch block and new A. fib with RVR   I have personally reviewed the EKG tracing and agree with the computerized printout as noted.  Labs Review Labs Reviewed - No data to display  Imaging Review No results found.   MDM   1. Atrial fibrillation with RVR    We'll start oxygen, cardiac monitor and IV. Transfer to [facility] via EMS.    Melony Overly, MD 07/17/14 [phone]

## 2014-07-17 NOTE — ED Notes (Signed)
Pt here from urgent care. Pt reports working in the yard yesterday and "I could work a while, then rest a while, the work a while, then have to rest a while." Urgent care dx new onset a fib. Pt has hx of parkinsons. NAD noted. A.o x 4.

## 2014-07-17 NOTE — Consult Note (Addendum)
CARDIOLOGY CONSULT NOTE  Patient ID: William [name] MRN: [id number] DOB/AGE: Jan 01, 1929 81 y.o.  Admit date: 07/17/2014 Primary Physician [name], MD Primary Cardiologist None Chief Complaint  Weakness.   HPI:  The patient has no past cardiac history.  He was feeling fine Friday and walked 18 holes of golf.  Yesterday he was weak doing the yard work.  His wife (signficant other but basically his wife by her report.) today said that his BP and HR were fluctuating.  She took him to urgent care.  He was noted to be in fib.  The patient denies any new symptoms such as chest discomfort, neck or arm discomfort. There has been no new shortness of breath, PND or orthopnea. There have been no reported palpitations, presyncope or syncope.  He might have felt that his heart was slightly fast but otherwise he has had no complaints.  He is very healthy and active.   Past Medical History  Diagnosis Date  . Atherosclerotic vascular disease   . Hyperlipidemia   . Hx of colonic polyps   . Benign prostatic hypertrophy with urinary obstruction   . TIA (transient ischemic attack)   . Subdural hematoma   . Tremor   . DJD (degenerative joint disease)   . Gout   . Malignant neoplasm of thyroid gland   . Primary skin squamous cell carcinoma   . Complication of anesthesia     "difficulty intubation, had to use fiberoptic 2006"  . Hypertension     sees [name]  . Thyroid disease   . Pneumonia     hx of in 1952  . Kidney tumor   . Parkinson disease   . Difficult intubation   . Hypothyroidism     Past Surgical History  Procedure Laterality Date  . Cataract sugery    . Cholecystectomy    . Right inguinal hernia repair    . Thyroidectomy for medullary carcinoma    . Eye surgery    . Knee arthroscopy    . Appendectomy    . Tonsillectomy    . Lumbar laminectomy/decompression microdiscectomy  05/14/2012    Procedure: LUMBAR LAMINECTOMY/DECOMPRESSION MICRODISCECTOMY 1 LEVEL;  Surgeon:  [name], MD;  Location: [facility];  Service: Neurosurgery;  Laterality: Left;  Left Lumbar four-five Laminectomy/Diskectomy/Far lateral diskectomy  . Kidney surgery  07-10-12  . Skin cancer removed  11/2013    No Known Allergies Prescriptions prior to admission  Medication Sig Dispense Refill Last Dose  . Acetaminophen 500 MG coapsule    Taking  . aspirin EC 81 MG tablet Take 81 mg by mouth daily.   Taking  . ASPIRIN LOW DOSE 81 MG EC tablet Take 81 mg by mouth daily.     . carbidopa-levodopa (SINEMET IR) 25-100 MG per tablet Take 1 tablet by mouth 4 (four) times daily. At 8, 12, 5 PM, and 9 or 10 PM 360 tablet 3 Taking  . cetirizine (ZYRTEC) 10 MG tablet Take 10 mg by mouth daily.     Marland Kitchen doxazosin (CARDURA) 8 MG tablet TAKE 1/2 TABLET BY MOUTH AT BEDTIME. 45 tablet 2 Taking  . levothyroxine (SYNTHROID, LEVOTHROID) 150 MCG tablet TAKE 1 TABLET ONE TIME DAILY BEFORE BREAKFAST 90 tablet 3   . losartan-hydrochlorothiazide (HYZAAR) 100-12.5 MG per tablet TAKE 1 TABLET BY MOUTH DAILY. 90 tablet 3 Taking  . Multiple Vitamin (MULTIVITAMIN WITH MINERALS) TABS Take 1 tablet by mouth daily.   Taking  . Probiotic Product (ALIGN) 4  MG CAPS Take 1 capsule by mouth daily.   Taking  . rasagiline (AZILECT) 1 MG TABS tablet Take 1 tablet (1 mg total) by mouth daily. 90 tablet 3 Taking  . simvastatin (ZOCOR) 40 MG tablet TAKE 1/2 TABLET BY MOUTH EVERY EVENING 45 tablet 3   . UNABLE TO FIND Triple antibiotic ointment   Taking  . UNABLE TO FIND Cetirizine-10mg    Taking   Family History  Problem Relation Age of Onset  . Stroke Mother   . Alzheimer's disease Brother   . Alzheimer's disease Father   . Alzheimer's disease Sister     History   Social History  . Marital Status: Widowed    Spouse Name: N/A  . Number of Children: 3  . Years of Education: 15   Occupational History  . retired    Social History Main Topics  . Smoking status: Former Smoker -- 0.80 packs/day for 15 years    Quit  date: 04/15/1965  . Smokeless tobacco: Never Used  . Alcohol Use: 0.0 oz/week    0 Standard drinks or equivalent per week     Comment: 1 glass of wine every night  . Drug Use: No  . Sexual Activity: Not on file   Other Topics Concern  . Not on file   Social History Narrative   Patient consumes no caffeine     ROS:    As stated in the HPI and negative for all other systems.  Physical Exam: Blood pressure 117/73, pulse 109, temperature 97.4 F (36.3 C), temperature source Oral, resp. rate 18, height 6\' 4"  (1.93 m), weight 180 lb (81.647 kg), SpO2 96 %.  GENERAL:  Well appearing and younger than stated age.  HEENT:  Pupils equal round and reactive, fundi not visualized, oral mucosa unremarkable NECK:  No jugular venous distention, waveform within normal limits, carotid upstroke brisk and symmetric, no bruits, no thyromegaly LYMPHATICS:  No cervical, inguinal adenopathy LUNGS:  Clear to auscultation bilaterally BACK:  No CVA tenderness CHEST:  Unremarkable HEART:  PMI not displaced or sustained,S1 and S2 within normal limits, no S3, no S4, no clicks, no rubs, no murmurs ABD:  Flat, positive bowel sounds normal in frequency in pitch, no bruits, no rebound, no guarding, no midline pulsatile mass, no hepatomegaly, no splenomegaly EXT:  2 plus pulses throughout, no edema, no cyanosis no clubbing SKIN:  No rashes no nodules NEURO:  Cranial nerves II through XII grossly intact, motor grossly intact throughout PSYCH:  Cognitively intact, oriented to person place and time   Labs: Lab Results  Component Value Date   BUN 18 07/17/2014   Lab Results  Component Value Date   CREATININE 1.48* 07/17/2014   Lab Results  Component Value Date   NA 140 07/17/2014   K 3.9 07/17/2014   CL 100 07/17/2014   CO2 31 07/17/2014   Lab Results  Component Value Date   TROPONINI 0.06* 07/17/2014   Lab Results  Component Value Date   WBC 5.5 07/17/2014   HGB 15.7 07/17/2014   HCT 48.3  07/17/2014   MCV 91.8 07/17/2014   PLT 142* 07/17/2014   Lab Results  Component Value Date   CHOL 131 12/21/2013   HDL 49.90 12/21/2013   LDLCALC 67 12/21/2013   TRIG 72.0 12/21/2013   CHOLHDL 3 12/21/2013      Radiology:   CXR: No active cardiopulmonary process. Sequela of prior granulomatous Disease.  KPT:WSFK, atrial fib.  07/17/2014  ASSESSMENT AND PLAN:  Atrial fib:  The patient  has a CHA2DS2 - VASc score of 5 with a risk of stroke of 6.7%.  I  Cannot be absolutely sure that this started less than 48 hours ago.  I would like for him to get a TEE/DCCV.  I an unable to get in contact with my partners who would do that tomorrow.  I think, if we can schedule it, that it would be very low risk if we gave him Xarelto tonight and again in the AM and them did the TEE/DCCV.  If we cannot schedule it for tomorrow I would send him home after conversion to or Cardizem and rate control and I will plan elective DCCV in 3 weeks.  Creat clearance calculates at almost exactly 50.  I will use full dose Xarelto.   Stop ASA.  This will require the discharging MD stop this on the discharge meds.  I told the patient not to take ASA..   Troponin:  Trend this.  I suspect this is related to the atrial fib.  I do not suspect any coronary syndrome.    CKD:  Creat is slightly elevated.  Follow.   RBBB:  New.  Plan is not altered by this.   SignedMinus Breeding 07/17/2014, 4:54 PM

## 2014-07-17 NOTE — H&P (Signed)
[facility] History and Physical  William [name] FBP:[id number] DOB: Aug 26, 1928 DOA: 07/17/2014  Referring physician: EDP PCP: [name], MD   Chief Complaint: Generalized weakness  HPI: William [name] is a 81 y.o. male with vasculitis of hypertension and [name], patient has history of subdural hematoma in 2003, he was on Plavix then. Patient woke up this morning with some weakness and feeling "funny", he take his blood pressure was in the low side, he was in a social event and he couldn't continue so he went to UC first, sent to the ED because of atrial fibrillation. In the ED EKG showed A. fib, with RVR, slight elevation of troponin to 0.06, patient denies chest pain.  Review of Systems:  Constitutional: negative for anorexia, fevers and sweats Eyes: negative for irritation, redness and visual disturbance Ears, nose, mouth, throat, and face: negative for earaches, epistaxis, nasal congestion and sore throat Respiratory: negative for cough, dyspnea on exertion, sputum and wheezing Cardiovascular: Generalized weakness, palpitations. Gastrointestinal: negative for abdominal pain, constipation, diarrhea, melena, nausea and vomiting Genitourinary:negative for dysuria, frequency and hematuria Hematologic/lymphatic: negative for bleeding, easy bruising and lymphadenopathy Musculoskeletal:negative for arthralgias, muscle weakness and stiff joints Neurological: negative for coordination problems, gait problems, headaches and weakness Endocrine: negative for diabetic symptoms including polydipsia, polyuria and weight loss Allergic/Immunologic: negative for anaphylaxis, hay fever and urticaria  Past Medical History  Diagnosis Date  . Atherosclerotic vascular disease   . Hyperlipidemia   . Hx of colonic polyps   . Benign prostatic hypertrophy with urinary obstruction   . TIA (transient ischemic attack)   . Subdural hematoma   . Tremor   . DJD (degenerative joint  disease)   . Gout   . Malignant neoplasm of thyroid gland   . Primary skin squamous cell carcinoma   . Complication of anesthesia     "difficulty intubation, had to use fiberoptic 2006"  . Hypertension     sees [name]  . Thyroid disease   . Pneumonia     hx of in 1952  . Kidney tumor   . Parkinson disease   . Difficult intubation   . Hypothyroidism    Past Surgical History  Procedure Laterality Date  . Cataract sugery    . Cholecystectomy    . Right inguinal hernia repair    . Thyroidectomy for medullary carcinoma    . Eye surgery    . Knee arthroscopy    . Appendectomy    . Tonsillectomy    . Lumbar laminectomy/decompression microdiscectomy  05/14/2012    Procedure: LUMBAR LAMINECTOMY/DECOMPRESSION MICRODISCECTOMY 1 LEVEL;  Surgeon: [name], MD;  Location: [facility];  Service: Neurosurgery;  Laterality: Left;  Left Lumbar four-five Laminectomy/Diskectomy/Far lateral diskectomy  . Kidney surgery  07-10-12  . Skin cancer removed  11/2013   Social History:   reports that he quit smoking about 49 years ago. He has never used smokeless tobacco. He reports that he drinks alcohol. He reports that he does not use illicit drugs.  No Known Allergies  Family History  Problem Relation Age of Onset  . Stroke Mother   . Alzheimer's disease Brother   . Alzheimer's disease Father   . Alzheimer's disease Sister      Prior to Admission medications   Medication Sig Start Date End Date Taking? Authorizing Provider  Acetaminophen 500 MG coapsule  02/14/14   Historical Provider, MD  aspirin EC 81 MG tablet Take 81 mg by mouth daily.  Historical Provider, MD  carbidopa-levodopa (SINEMET IR) 25-100 MG per tablet Take 1 tablet by mouth 4 (four) times daily. At 8, 12, 5 PM, and 9 or 10 PM 05/04/14   Star Age, MD  doxazosin (CARDURA) 8 MG tablet TAKE 1/2 TABLET BY MOUTH AT BEDTIME. 03/04/14   [name], MD  levothyroxine (SYNTHROID, LEVOTHROID) 150 MCG tablet TAKE 1  TABLET ONE TIME DAILY BEFORE BREAKFAST 07/15/14   [name], MD  losartan-hydrochlorothiazide (HYZAAR) 100-12.5 MG per tablet TAKE 1 TABLET BY MOUTH DAILY. 05/02/14   [name], MD  Multiple Vitamin (MULTIVITAMIN WITH MINERALS) TABS Take 1 tablet by mouth daily.    Historical Provider, MD  Probiotic Product (ALIGN) 4 MG CAPS Take 1 capsule by mouth daily.    Historical Provider, MD  rasagiline (AZILECT) 1 MG TABS tablet Take 1 tablet (1 mg total) by mouth daily. 05/04/14   Star Age, MD  simvastatin (ZOCOR) 40 MG tablet TAKE 1/2 TABLET BY MOUTH EVERY EVENING 07/15/14   [name], MD  UNABLE TO FIND Triple antibiotic ointment 02/14/14   Historical Provider, MD  [place] Cetirizine-10mg  02/14/14   Historical Provider, MD   Physical Exam: Filed Vitals:   07/17/14 1500  BP: 108/67  Pulse: 31  Temp:   Resp: 16   Constitutional: Oriented to person, place, and time. Well-developed and well-nourished. Cooperative.  Head: Normocephalic and atraumatic.  Nose: Nose normal.  Mouth/Throat: Uvula is midline, oropharynx is clear and moist and mucous membranes are normal.  Eyes: Conjunctivae and EOM are normal. Pupils are equal, round, and reactive to light.  Neck: Trachea normal and normal range of motion. Neck supple.  Cardiovascular: Normal rate, regular rhythm, S1 normal, S2 normal, normal heart sounds and intact distal pulses.   Pulmonary/Chest: Effort normal and breath sounds normal.  Abdominal: Soft. Bowel sounds are normal. There is no hepatosplenomegaly. There is no tenderness.  Musculoskeletal: Normal range of motion.  Neurological: Alert and oriented to person, place, and time. Has normal strength. No cranial nerve deficit or sensory deficit.  Skin: Skin is warm, dry and intact.  Psychiatric: Has a normal mood and affect. Speech is normal and behavior is normal.   Labs on Admission:  Basic Metabolic Panel:  Recent Labs Lab 07/15/14 1620 07/17/14 1331  NA  --  140  K  --   3.9  CL  --  100  CO2  --  31  GLUCOSE  --  103*  BUN 21 18  CREATININE 1.31 1.48*  CALCIUM  --  8.4   Liver Function Tests: No results for input(s): AST, ALT, ALKPHOS, BILITOT, PROT, ALBUMIN in the last 168 hours. No results for input(s): LIPASE, AMYLASE in the last 168 hours. No results for input(s): AMMONIA in the last 168 hours. CBC:  Recent Labs Lab 07/17/14 1331  WBC 5.5  NEUTROABS 4.0  HGB 15.7  HCT 48.3  MCV 91.8  PLT 142*   Cardiac Enzymes:  Recent Labs Lab 07/17/14 1331  TROPONINI 0.06*    BNP (last 3 results) No results for input(s): BNP in the last 8760 hours.  ProBNP (last 3 results) No results for input(s): PROBNP in the last 8760 hours.  CBG: No results for input(s): GLUCAP in the last 168 hours.  Radiological Exams on Admission: Dg Chest 2 View  07/17/2014   CLINICAL DATA:  Shortness of breath, dizziness and weakness for 1 day. History atrial fibrillation and hypertension. Ex-smoker. Initial encounter.  EXAM: CHEST  2 VIEW  COMPARISON:  Radiographs 05/14/2012 and 04/26/2014.  FINDINGS: The heart size and mediastinal contours are stable. There are multiple calcified mediastinal lymph nodes which are unchanged. The lungs are clear. There is no pleural effusion or pneumothorax. The bones appear unchanged.  IMPRESSION: No active cardiopulmonary process. Sequela of prior granulomatous disease.   Electronically Signed   By: Richardean Sale M.D.   On: 07/17/2014 14:31    EKG: Independently reviewed. A. fib  Assessment/Plan Active Problems:   Essential hypertension   SUBDURAL HEMATOMA   BPH (benign prostatic hypertrophy)   Parkinson disease, symptomatic   Atrial fibrillation with rapid ventricular response   Elevated troponin    Atrial fibrillation A. fib with rapid ventricular response, symptomatic as well with palpitations and generalized weakness. Patient admitted to the hospital started on Cardizem drip in the ED, will continue. Control rate  between 60-100. Check TSH CHA2DS2-VASc score of 5 because of age, history of TIA, male gender and hypertension. Anticoagulation none started, will leave to cardiology, please note that patient has history of SDH in 2006.  Elevated troponin Slightly elevated troponin of 0.06, patient denies any chest pain. This is likely secondary to the rapid ventricular response secondary to the A. fib. Cycle 3 sets of cardiac enzymes.  Hypertension Blood pressure soft likely secondary to the RVR. Hold blood pressure medications.  Parkinson disease This is chronic and stable condition, continue home medications.  History of subdural hematoma Patient had a history of SDH in July of 2006, patient was on Plavix then for history of TIA. Had surgical evacuation, and was taken off of Plavix, currently is on low-dose aspirin.  Code Status: Full code Family Communication: Plan discussed with the patient in presence of his wife at bedside. Disposition Plan: Observation, telemetry  Time spent: 70 minutes  Lindzie Boxx A, MD [facility] Pager [phone]

## 2014-07-17 NOTE — ED Notes (Signed)
Cardiology at bedside.

## 2014-07-17 NOTE — ED Provider Notes (Signed)
CSN: [id number]     Arrival date & time 07/17/14  1306 History   First MD Initiated Contact with Patient 07/17/14 1317     Chief Complaint  Patient presents with  . Atrial Fibrillation     (Consider location/radiation/quality/duration/timing/severity/associated sxs/prior Treatment) HPI Comments: Patient here with sudden onset of weakness yesterday while outside. Went to urgent care today was found to be a new onset atrial fibrillation with rapid ventricular rate response. Denies any associated chest pain or shortness of breath. No lower extremity edema noted. No syncope or near-syncope. No diaphoresis. Was sent to for further evaluation.  Patient is a 81 y.o. male presenting with atrial fibrillation. The history is provided by the patient.  Atrial Fibrillation    Past Medical History  Diagnosis Date  . Atherosclerotic vascular disease   . Hyperlipidemia   . Hx of colonic polyps   . Benign prostatic hypertrophy with urinary obstruction   . TIA (transient ischemic attack)   . Subdural hematoma   . Tremor   . DJD (degenerative joint disease)   . Gout   . Malignant neoplasm of thyroid gland   . Primary skin squamous cell carcinoma   . Complication of anesthesia     "difficulty intubation, had to use fiberoptic 2006"  . Hypertension     sees [name]  . Thyroid disease   . Pneumonia     hx of in 1952  . Kidney tumor   . Parkinson disease   . Difficult intubation   . Hypothyroidism    Past Surgical History  Procedure Laterality Date  . Cataract sugery    . Cholecystectomy    . Right inguinal hernia repair    . Thyroidectomy for medullary carcinoma    . Eye surgery    . Knee arthroscopy    . Appendectomy    . Tonsillectomy    . Lumbar laminectomy/decompression microdiscectomy  05/14/2012    Procedure: LUMBAR LAMINECTOMY/DECOMPRESSION MICRODISCECTOMY 1 LEVEL;  Surgeon: [name], MD;  Location: [facility];  Service: Neurosurgery;  Laterality: Left;  Left  Lumbar four-five Laminectomy/Diskectomy/Far lateral diskectomy  . Kidney surgery  07-10-12  . Skin cancer removed  11/2013   Family History  Problem Relation Age of Onset  . Stroke Mother   . Alzheimer's disease Brother   . Alzheimer's disease Father   . Alzheimer's disease Sister    History  Substance Use Topics  . Smoking status: Former Smoker -- 0.80 packs/day for 15 years    Quit date: 04/15/1965  . Smokeless tobacco: Never Used  . Alcohol Use: 0.0 oz/week    0 Standard drinks or equivalent per week     Comment: 1 glass of wine every night    Review of Systems  All other systems reviewed and are negative.     Allergies  Review of patient's allergies indicates no known allergies.  Home Medications   Prior to Admission medications   Medication Sig Start Date End Date Taking? Authorizing Provider  Acetaminophen 500 MG coapsule  02/14/14   Historical Provider, MD  aspirin EC 81 MG tablet Take 81 mg by mouth daily.    Historical Provider, MD  carbidopa-levodopa (SINEMET IR) 25-100 MG per tablet Take 1 tablet by mouth 4 (four) times daily. At 8, 12, 5 PM, and 9 or 10 PM 05/04/14   Star Age, MD  doxazosin (CARDURA) 8 MG tablet TAKE 1/2 TABLET BY MOUTH AT BEDTIME. 03/04/14   [name], MD  levothyroxine (  SYNTHROID, LEVOTHROID) 150 MCG tablet TAKE 1 TABLET ONE TIME DAILY BEFORE BREAKFAST 07/15/14   [name], MD  losartan-hydrochlorothiazide (HYZAAR) 100-12.5 MG per tablet TAKE 1 TABLET BY MOUTH DAILY. 05/02/14   [name], MD  Multiple Vitamin (MULTIVITAMIN WITH MINERALS) TABS Take 1 tablet by mouth daily.    Historical Provider, MD  Probiotic Product (ALIGN) 4 MG CAPS Take 1 capsule by mouth daily.    Historical Provider, MD  rasagiline (AZILECT) 1 MG TABS tablet Take 1 tablet (1 mg total) by mouth daily. 05/04/14   Star Age, MD  simvastatin (ZOCOR) 40 MG tablet TAKE 1/2 TABLET BY MOUTH EVERY EVENING 07/15/14   [name], MD  UNABLE TO FIND Triple antibiotic  ointment 02/14/14   Historical Provider, MD  UNABLE TO FIND Cetirizine-10mg  02/14/14   Historical Provider, MD   BP 115/91 mmHg  Pulse 65  Temp(Src) 97.4 F (36.3 C) (Oral)  Resp 27  Ht 6\' 4"  (1.93 m)  Wt 180 lb (81.647 kg)  BMI 21.92 kg/m2  SpO2 93% Physical Exam  Constitutional: He is oriented to person, place, and time. He appears well-developed and well-nourished.  Non-toxic appearance. No distress.  HENT:  Head: Normocephalic and atraumatic.  Eyes: Conjunctivae, EOM and lids are normal. Pupils are equal, round, and reactive to light.  Neck: Normal range of motion. Neck supple. No tracheal deviation present. No thyroid mass present.  Cardiovascular: Normal heart sounds.  An irregularly irregular rhythm present. Tachycardia present.  Exam reveals no gallop.   No murmur heard. Pulmonary/Chest: Effort normal and breath sounds normal. No stridor. No respiratory distress. He has no decreased breath sounds. He has no wheezes. He has no rhonchi. He has no rales.  Abdominal: Soft. Normal appearance and bowel sounds are normal. He exhibits no distension. There is no tenderness. There is no rebound and no CVA tenderness.  Musculoskeletal: Normal range of motion. He exhibits no edema or tenderness.  Neurological: He is alert and oriented to person, place, and time. He has normal strength. No cranial nerve deficit or sensory deficit. GCS eye subscore is 4. GCS verbal subscore is 5. GCS motor subscore is 6.  Skin: Skin is warm and dry. No abrasion and no rash noted.  Psychiatric: He has a normal mood and affect. His speech is normal and behavior is normal.  Nursing note and vitals reviewed.   ED Course  Procedures (including critical care time) Labs Review Labs Reviewed  TROPONIN I - Abnormal; Notable for the following:    Troponin I 0.06 (*)    All other components within normal limits  CBC WITH DIFFERENTIAL/PLATELET - Abnormal; Notable for the following:    Platelets 142 (*)    All other  components within normal limits  BASIC METABOLIC PANEL - Abnormal; Notable for the following:    Glucose, Bld 103 (*)    Creatinine, Ser 1.48 (*)    GFR calc non Af Amer 42 (*)    GFR calc Af Amer 48 (*)    All other components within normal limits  PROTIME-INR  APTT    Imaging Review Dg Chest 2 View  07/17/2014   CLINICAL DATA:  Shortness of breath, dizziness and weakness for 1 day. History atrial fibrillation and hypertension. Ex-smoker. Initial encounter.  EXAM: CHEST  2 VIEW  COMPARISON:  Radiographs 05/14/2012 and 04/26/2014.  FINDINGS: The heart size and mediastinal contours are stable. There are multiple calcified mediastinal lymph nodes which are unchanged. The lungs are clear. There  is no pleural effusion or pneumothorax. The bones appear unchanged.  IMPRESSION: No active cardiopulmonary process. Sequela of prior granulomatous disease.   Electronically Signed   By: Richardean Sale M.D.   On: 07/17/2014 14:31     EKG Interpretation None      MDM   Final diagnoses:  SOB (shortness of breath)    ED ECG REPORT   Date: 07/17/2014  Rate: 115  Rhythm: atrial fibrillation  QRS Axis: normal  Intervals: normal  ST/T Wave abnormalities: nonspecific ST changes  Conduction Disutrbances:right bundle branch block  Narrative Interpretation:   Old EKG Reviewed: changes noted  I have personally reviewed the EKG tracing and agree with the computerized printout as noted.  Patient here with new onset atrial fibrillation. Patient given Cardizem bolus and started on Cardizem drip. Patient's troponin noted at 0.06. Suspect that this is from his tachycardia. He denies any chest pain. His EKG shows no evidence of acute infarction. Discuss with hospitalist and he will be admitted   CRITICAL CARE Performed by: Leota Jacobsen Total critical care time: 50 Critical care time was exclusive of separately billable procedures and treating other patients. Critical care was necessary to treat or  prevent imminent or life-threatening deterioration. Critical care was time spent personally by me on the following activities: development of treatment plan with patient and/or surrogate as well as nursing, discussions with consultants, evaluation of patient's response to treatment, examination of patient, obtaining history from patient or surrogate, ordering and performing treatments and interventions, ordering and review of laboratory studies, ordering and review of radiographic studies, pulse oximetry and re-evaluation of patient's condition.   Lacretia Leigh, MD 07/17/14 [phone]

## 2014-07-18 ENCOUNTER — Other Ambulatory Visit: Payer: Self-pay | Admitting: Cardiology

## 2014-07-18 DIAGNOSIS — I480 Paroxysmal atrial fibrillation: Secondary | ICD-10-CM | POA: Diagnosis not present

## 2014-07-18 DIAGNOSIS — I4891 Unspecified atrial fibrillation: Secondary | ICD-10-CM | POA: Diagnosis not present

## 2014-07-18 DIAGNOSIS — R778 Other specified abnormalities of plasma proteins: Secondary | ICD-10-CM

## 2014-07-18 DIAGNOSIS — I10 Essential (primary) hypertension: Secondary | ICD-10-CM | POA: Diagnosis not present

## 2014-07-18 DIAGNOSIS — M109 Gout, unspecified: Secondary | ICD-10-CM | POA: Diagnosis not present

## 2014-07-18 DIAGNOSIS — E785 Hyperlipidemia, unspecified: Secondary | ICD-10-CM | POA: Diagnosis not present

## 2014-07-18 DIAGNOSIS — G20 Parkinson's disease: Secondary | ICD-10-CM | POA: Diagnosis not present

## 2014-07-18 DIAGNOSIS — R7989 Other specified abnormal findings of blood chemistry: Principal | ICD-10-CM

## 2014-07-18 DIAGNOSIS — N183 Chronic kidney disease, stage 3 (moderate): Secondary | ICD-10-CM

## 2014-07-18 DIAGNOSIS — E039 Hypothyroidism, unspecified: Secondary | ICD-10-CM | POA: Diagnosis not present

## 2014-07-18 DIAGNOSIS — N400 Enlarged prostate without lower urinary tract symptoms: Secondary | ICD-10-CM | POA: Diagnosis not present

## 2014-07-18 LAB — CBC
HCT: 45.2 % (ref 39.0–52.0)
HEMOGLOBIN: 14.3 g/dL (ref 13.0–17.0)
MCH: 29.5 pg (ref 26.0–34.0)
MCHC: 31.6 g/dL (ref 30.0–36.0)
MCV: 93.2 fL (ref 78.0–100.0)
PLATELETS: 131 10*3/uL — AB (ref 150–400)
RBC: 4.85 MIL/uL (ref 4.22–5.81)
RDW: 14.3 % (ref 11.5–15.5)
WBC: 6.1 10*3/uL (ref 4.0–10.5)

## 2014-07-18 LAB — TROPONIN I
TROPONIN I: 0.27 ng/mL — AB (ref <0.031)
Troponin I: 0.33 ng/mL — ABNORMAL HIGH (ref <0.031)

## 2014-07-18 LAB — HEMOGLOBIN A1C
Hgb A1c MFr Bld: 5.6 % (ref 4.8–5.6)
Mean Plasma Glucose: 114 mg/dL

## 2014-07-18 LAB — URINALYSIS, ROUTINE W REFLEX MICROSCOPIC
BILIRUBIN URINE: NEGATIVE
Glucose, UA: NEGATIVE mg/dL
Hgb urine dipstick: NEGATIVE
KETONES UR: NEGATIVE mg/dL
Leukocytes, UA: NEGATIVE
NITRITE: NEGATIVE
Protein, ur: NEGATIVE mg/dL
Specific Gravity, Urine: 1.023 (ref 1.005–1.030)
Urobilinogen, UA: 0.2 mg/dL (ref 0.0–1.0)
pH: 5 (ref 5.0–8.0)

## 2014-07-18 LAB — BASIC METABOLIC PANEL
Anion gap: 6 (ref 5–15)
BUN: 19 mg/dL (ref 6–23)
CALCIUM: 8.3 mg/dL — AB (ref 8.4–10.5)
CO2: 33 mmol/L — ABNORMAL HIGH (ref 19–32)
Chloride: 105 mmol/L (ref 96–112)
Creatinine, Ser: 1.62 mg/dL — ABNORMAL HIGH (ref 0.50–1.35)
GFR calc Af Amer: 43 mL/min — ABNORMAL LOW (ref >90)
GFR, EST NON AFRICAN AMERICAN: 37 mL/min — AB (ref >90)
Glucose, Bld: 97 mg/dL (ref 70–99)
POTASSIUM: 4.1 mmol/L (ref 3.5–5.1)
SODIUM: 144 mmol/L (ref 135–145)

## 2014-07-18 MED ORDER — DILTIAZEM HCL 60 MG PO TABS
60.0000 mg | ORAL_TABLET | Freq: Two times a day (BID) | ORAL | Status: DC | PRN
  Filled 2014-07-18: qty 1

## 2014-07-18 MED ORDER — RIVAROXABAN (XARELTO) VTE STARTER PACK (15 & 20 MG): ORAL_TABLET | ORAL | Status: DC

## 2014-07-18 MED ORDER — TAMSULOSIN HCL 0.4 MG PO CAPS: 0.4000 mg | ORAL_CAPSULE | Freq: Every day | ORAL | Status: DC

## 2014-07-18 MED ORDER — ATORVASTATIN CALCIUM 20 MG PO TABS: 20.0000 mg | ORAL_TABLET | Freq: Every day | ORAL | Status: DC

## 2014-07-18 MED ORDER — DILTIAZEM HCL 60 MG PO TABS: 60.0000 mg | ORAL_TABLET | Freq: Two times a day (BID) | ORAL | Status: DC | PRN

## 2014-07-18 MED ORDER — RIVAROXABAN 15 MG PO TABS: 15.0000 mg | ORAL_TABLET | Freq: Every day | ORAL | Status: DC

## 2014-07-18 MED ORDER — RIVAROXABAN 15 MG PO TABS
15.0000 mg | ORAL_TABLET | Freq: Every day | ORAL | Status: DC
  Administered 2014-07-18: 15 mg via ORAL
  Filled 2014-07-18: qty 1

## 2014-07-18 MED ORDER — LOSARTAN POTASSIUM 25 MG PO TABS: 25.0000 mg | ORAL_TABLET | Freq: Every day | ORAL | Status: DC

## 2014-07-18 MED ORDER — LEVOTHYROXINE SODIUM 100 MCG PO TABS: 100.0000 ug | ORAL_TABLET | Freq: Every day | ORAL | Status: DC

## 2014-07-18 NOTE — Progress Notes (Signed)
CARE MANAGEMENT NOTE 07/18/2014  Patient:  William [name], William [name]   Account Number:  [id number]  Date Initiated:  07/18/2014  Documentation initiated by:  [facility]  Subjective/Objective Assessment:   afib with RVR     Action/Plan:   lives at home with wife   Anticipated DC Date:  07/18/2014   Anticipated DC Plan:  [place]  CM consult  Medication Assistance      Choice offered to / List presented to:             Status of service:  Completed, signed off Medicare Important Message given?  NO (If response is "NO", the following Medicare IM given date fields will be blank) Date Medicare IM given:   Medicare IM given by:   Date Additional Medicare IM given:   Additional Medicare IM given by:    Discharge Disposition:  HOME/SELF CARE  Per UR Regulation:    If discussed at Long Length of Stay Meetings, dates discussed:    Comments:  07/18/2014 1500 Provided pt with Xarelto package with 30 day free trial card. Sent info to have Benefits checked for medication. Pt states if the medication copay is too expensive he can [facility]. Jonnie Finner RN CCM Case Mgmt phone [phone]

## 2014-07-18 NOTE — Discharge Instructions (Signed)

## 2014-07-18 NOTE — Significant Event (Signed)
Patient discharge to home. Review AVS with patient and a copy of it given to patient. All questions answered. Patient received first dose of Xarelto 15mg  per pharmacist and verbalize understanding next dose is tomorrow. Hard copy of prescriptions given to patient. Patient taken to transportation by staff [facility]. All personal belongings taken home by patient and his spouse. Klayton Monie, Therapist, sports.

## 2014-07-18 NOTE — Discharge Summary (Addendum)
Discharge Summary  William [name] KVQ:[id number] DOB: 07/03/1928  PCP: [name], MD  Admit date: 07/17/2014 Discharge date: 07/18/2014  Time spent: >25mins  Recommendations for Outpatient Follow-up:  1. F/u with PMD with in a week, pmd to monitor blood pressure medication adjustment, pmd to consider decrease synthroid dose. pmd to repeat bmp/cbc in one week. 2. F/u with cardiology within two weeks for afib.  Discharge Diagnoses:  Active Hospital Problems   Diagnosis Date Noted  . Atrial fibrillation with rapid ventricular response 07/17/2014  . Elevated troponin 07/17/2014  . Parkinson disease, symptomatic 06/06/2011  . BPH (benign prostatic hypertrophy) 12/10/2007  . SUBDURAL HEMATOMA 12/10/2007  . Essential hypertension 04/22/2007    Resolved Hospital Problems   Diagnosis Date Noted Date Resolved  No resolved problems to display.    Discharge Condition: stable  Diet recommendation: heart healthy  Filed Weights   07/17/14 1314  Weight: 81.647 kg (180 lb)    History of present illness:  William [name] is a 81 y.o. male with vasculitis of hypertension and [name], patient has history of subdural hematoma in 2003, he was on Plavix then. Patient woke up this morning with some weakness and feeling "funny", he take his blood pressure was in the low side, he was in a social event and he couldn't continue so he went to UC first, sent to the ED because of atrial fibrillation. In the ED EKG showed A. fib, with RVR, slight elevation of troponin to 0.06, patient denies chest pain.  Hospital Course:  Active Problems:   Essential hypertension   SUBDURAL HEMATOMA   BPH (benign prostatic hypertrophy)   Parkinson disease, symptomatic   Atrial fibrillation with rapid ventricular response   Elevated troponin  paroxysmal Atrial fibrillation Admitted with A. fib with rapid ventricular response, converted back to sinus rhythm with cardizem drio Does has intermittent  bradycardia, heart rate lowest at 46 during sleep. CHA2DS2-VASc score of 5 because of age, history of TIA, male gender and hypertension. Cardiology consulted, recommended cardizem prn, start xarelto renal dosing 15mg  po qd, outpatient myoview in two weeks. Cardiology cleared patient to be discharged today after echo done, cardiology will follow up on echo result.   Elevated troponin Slightly elevated troponin of 0.06, patient denies any chest pain. This is likely secondary to the rapid ventricular response secondary to the A. fib.  HLD: due to interaction  with cardizem, change zocor to lipitor   CKDIII, ua concentrated, no infection. Will d/c hctz.   Hypertension Blood pressure soft likely secondary to the RVR.  blood pressure medications adjusted, d/c hctz, continue losartan at lower dose 25mg  po qd.  BPH, will change cardura to flomax due to low bp.  Parkinson disease This is chronic and stable condition, continue home medications.  History of subdural hematoma Patient had a history of SDH in July of 2006, patient was on Plavix then for history of TIA. Had surgical evacuation, and was taken off of Plavix, currently is on low-dose aspirin. Cardiology recommended start xarelto, due to  Risk of stroke overweight risk of remote subdural hematoma. Patient is instructed to implement fall precaution. D/c asa.  H/o thyroid cancer s/p resection, on synthroid supplement, tsh 0.5, consider lower synthroid dose to 18mcg po qd, repeat tsh in 4-6 weeks, patient is aware to discuss this with pmd.  Code Status: Full code Family Communication: Plan discussed with the patient in presence of his wife at bedside. Procedures:  echo  Consultations:  cardiology  Discharge Exam:  BP 100/48 mmHg  Pulse 52  Temp(Src) 97.6 F (36.4 C) (Oral)  Resp 18  Ht 6\' 4"  (1.93 m)  Wt 81.647 kg (180 lb)  BMI 21.92 kg/m2  SpO2 96%  Constitutional: Oriented to person, place, and time. Well-developed and  well-nourished. Cooperative.  Head: Normocephalic and atraumatic.  Nose: Nose normal.  Mouth/Throat: Uvula is midline, oropharynx is clear and moist and mucous membranes are normal.  Eyes: Conjunctivae and EOM are normal. Pupils are equal, round, and reactive to light.  Neck: Trachea normal and normal range of motion. Neck supple.  Cardiovascular: Normal rate, regular rhythm, S1 normal, S2 normal, normal heart sounds and intact distal pulses.  Pulmonary/Chest: Effort normal and breath sounds normal.  Abdominal: Soft. Bowel sounds are normal. There is no hepatosplenomegaly. There is no tenderness.  Musculoskeletal: Normal range of motion.  Neurological: Alert and oriented to person, place, and time. Has normal strength. No cranial nerve deficit or sensory deficit. Mild tremor, reported at baseline. Skin: Skin is warm, dry and intact.  Psychiatric: Has a normal mood and affect. Speech is normal and behavior is normal.    Discharge Instructions You were cared for by a hospitalist during your hospital stay. If you have any questions about your discharge medications or the care you received while you were in the hospital after you are discharged, you can call the unit and asked to speak with the hospitalist on call if the hospitalist that took care of you is not available. Once you are discharged, your primary care physician will handle any further medical issues. Please note that NO REFILLS for any discharge medications will be authorized once you are discharged, as it is imperative that you return to your primary care physician (or establish a relationship with a primary care physician if you do not have one) for your aftercare needs so that they can reassess your need for medications and monitor your lab values.      Discharge Instructions    Diet - low sodium heart healthy    Complete by:  As directed      Increase activity slowly    Complete by:  As directed             Medication  List    STOP taking these medications        aspirin EC 81 MG tablet     ASPIRIN LOW DOSE 81 MG EC tablet  Generic drug:  aspirin     doxazosin 8 MG tablet  Commonly known as:  CARDURA     losartan-hydrochlorothiazide 100-12.5 MG per tablet  Commonly known as:  HYZAAR     simvastatin 40 MG tablet  Commonly known as:  ZOCOR      TAKE these medications        Acetaminophen 500 MG coapsule     ALIGN 4 MG Caps  Take 1 capsule by mouth daily.     atorvastatin 20 MG tablet  Commonly known as:  LIPITOR  Take 1 tablet (20 mg total) by mouth daily.     carbidopa-levodopa 25-100 MG per tablet  Commonly known as:  SINEMET IR  Take 1 tablet by mouth 4 (four) times daily. At 8, 12, 5 PM, and 9 or 10 PM     cetirizine 10 MG tablet  Commonly known as:  ZYRTEC  Take 10 mg by mouth daily.     diltiazem 60 MG tablet  Commonly known as:  CARDIZEM  Take 1 tablet (60 mg total)  by mouth 2 (two) times daily as needed (palpitations).     levothyroxine 150 MCG tablet  Commonly known as:  SYNTHROID, LEVOTHROID  TAKE 1 TABLET ONE TIME DAILY BEFORE BREAKFAST     losartan 25 MG tablet  Commonly known as:  COZAAR  Take 1 tablet (25 mg total) by mouth daily.     multivitamin with minerals Tabs tablet  Take 1 tablet by mouth daily.     rasagiline 1 MG Tabs tablet  Commonly known as:  AZILECT  Take 1 tablet (1 mg total) by mouth daily.     Rivaroxaban 15 MG Tabs tablet  Commonly known as:  XARELTO  Take 1 tablet (15 mg total) by mouth daily with supper.     tamsulosin 0.4 MG Caps capsule  Commonly known as:  FLOMAX  Take 1 capsule (0.4 mg total) by mouth daily.     UNABLE TO FIND  Triple antibiotic ointment       No Known Allergies Follow-up Information    Follow up with Minus Breeding, MD.   Specialty:  Cardiology   Why:  office will call you   Contact information:   [address] [place] [address] [phone]        The results of significant  diagnostics from this hospitalization (including imaging, microbiology, ancillary and laboratory) are listed below for reference.    Significant Diagnostic Studies: Dg Chest 2 View  07/17/2014   CLINICAL DATA:  Shortness of breath, dizziness and weakness for 1 day. History atrial fibrillation and hypertension. Ex-smoker. Initial encounter.  EXAM: CHEST  2 VIEW  COMPARISON:  Radiographs 05/14/2012 and 04/26/2014.  FINDINGS: The heart size and mediastinal contours are stable. There are multiple calcified mediastinal lymph nodes which are unchanged. The lungs are clear. There is no pleural effusion or pneumothorax. The bones appear unchanged.  IMPRESSION: No active cardiopulmonary process. Sequela of prior granulomatous disease.   Electronically Signed   By: Richardean Sale M.D.   On: 07/17/2014 14:31    Microbiology: No results found for this or any previous visit (from the past 240 hour(s)).   Labs: Basic Metabolic Panel:  Recent Labs Lab 07/15/14 1620 07/17/14 1331 07/18/14 0552  NA  --  140 144  K  --  3.9 4.1  CL  --  100 105  CO2  --  31 33*  GLUCOSE  --  103* 97  BUN 21 18 19   CREATININE 1.31 1.48* 1.62*  CALCIUM  --  8.4 8.3*   Liver Function Tests: No results for input(s): AST, ALT, ALKPHOS, BILITOT, PROT, ALBUMIN in the last 168 hours. No results for input(s): LIPASE, AMYLASE in the last 168 hours. No results for input(s): AMMONIA in the last 168 hours. CBC:  Recent Labs Lab 07/17/14 1331 07/18/14 0552  WBC 5.5 6.1  NEUTROABS 4.0  --   HGB 15.7 14.3  HCT 48.3 45.2  MCV 91.8 93.2  PLT 142* 131*   Cardiac Enzymes:  Recent Labs Lab 07/17/14 1331 07/17/14 1850 07/18/14 0015 07/18/14 0552  TROPONINI 0.06* 0.29* 0.33* 0.27*   BNP: BNP (last 3 results) No results for input(s): BNP in the last 8760 hours.  ProBNP (last 3 results) No results for input(s): PROBNP in the last 8760 hours.  CBG: No results for input(s): GLUCAP in the last 168  hours.     SignedFlorencia Reasons MD, PhD  [facility] 07/18/2014, 3:29 PM

## 2014-07-18 NOTE — Progress Notes (Signed)
  Pharmacy Discharge Medication Therapy Review   Total Number of meds on admission: 10 (polypharmacy > 10 meds)  Indications for all medications: [x]  Yes       []  No  Adherence Review  [x]  Excellent (no doses missed/week)     []  Good (no more than 1 dose missed/week)     []  Partial (2-3 doses missed/week)     []  Poor (>3 doses missed/week)  Total number of high risk medications: 1 (Anticoagulants, Dual antiplatelets, oral Antihyperglycemic agents,Insulins, Antipsychotics, Anti-Seizure meds, Inhalers, HF/ACS meds, Antibiotics and HIV medications)   Assessment: (Medication related problems)  Intervention  YES NO  Explanation   Indications      Medication without noted indication []  [x]     Indication without noted medication []  [x]     Duplicate therapy []  [x]    Efficacy      Suboptimal drug or dose selection []  [x]    Insufficient dose/duration []  [x]    Failure to receive therapy  (Rx not filled) []  [x]     Safety      Adverse drug event [x]  []  Patient elderly (37 yoa) and on doxazosin for BPH, has had some hypotension. Recommend switching to tamsulosin (less orthostatic hypotension) and just as effective.   Drug interaction [x]  []  New start diltiazem and patient with PTA simvastatin 40mg  daily. Drug interaction - max dose simva is 10mg  with concomitant diltiazem. Recommend changing to atorvastatin.   Excessive dose/duration [x]  []  Xarelto started pack ordered (15mg  BID for 21 days, then 20mg  daily). However, only indication for anticoag is afib, does not need higher starting dose. CrCl using actual body weight is 40 mL/min, patient qualifies for 15mg  daily dose.  TSH on the lower end - per Dr. Erlinda Hong, decrease dose from 150 mcg to 100 mcg daily. Discharge med list still showed 150 mcg daily - confirmed with MD to decrease dose.  High-risk medications [x]  []  Anticoagulants  Compliance     Underuse []  [x]     Overuse []  [x]    Other pertinent pharmacist counseling [x]  []  Discussed many  medication changes with patient and his wife and wrote them down as to decrease confusion. Counseling for all new medications with an emphasis on anticoag with Xarelto.    Total number of new medications upon discharge: 5  Time:  Time spent preparing for discharge counseling: 15 mins Time spent counseling patient: 30 mins  PLAN:  Changes discussed with and approved by Dr. Erlinda Hong and are as follows:  - Start taking Xarelto 15mg  once daily with dinner - Stop taking simvastatin and start taking atorvastatin 20mg  po daily to avoid drug interaction with diltiazem - Stop taking doxazosin and start taking tamsulosin 0.4mg  po daily - Decrease dose from 184mcg to 138mcg daily  Elisse Pennick E. Henderson Frampton, Pharm.D Clinical Pharmacy Resident Pager: [phone] 07/18/2014 4:34 PM

## 2014-07-18 NOTE — Progress Notes (Signed)
UR completed 

## 2014-07-18 NOTE — Progress Notes (Signed)
Diltiazem 60 mg BID prn palpitations ordered. OP Lexiscan to be done in a week or so. F/U with Dr Percival Spanish to be arranged (office will call). Pt should be OK to go home after echo is done. Results could be followed up as an OP.  Kerin Ransom PA-C 07/18/2014 12:24 PM

## 2014-07-18 NOTE — Progress Notes (Addendum)
Patient Name: William [name] Date of Encounter: 07/18/2014  Active Problems:   Essential hypertension   SUBDURAL HEMATOMA   BPH (benign prostatic hypertrophy)   Parkinson disease, symptomatic   Atrial fibrillation with rapid ventricular response   Elevated troponin   Length of Stay:   SUBJECTIVE  He feels well, had a great night's rest. Overnight heart rate dropped to the 40s. Off diltiazem. In normal sinus rhythm since yesterday afternoon. He reports that at home his typical heart rate is in the low 50s when checked with his home blood pressure monitor  CURRENT MEDS . carbidopa-levodopa  1 tablet Oral QID  . doxazosin  8 mg Oral Daily  . levothyroxine  150 mcg Oral QAC breakfast  . rasagiline  1 mg Oral Daily  . rivaroxaban  20 mg Oral Daily  . simvastatin  20 mg Oral QPM  . sodium chloride  3 mL Intravenous Q12H    OBJECTIVE  No intake or output data in the 24 hours ending 07/18/14 0926 Filed Weights   07/17/14 1314  Weight: 180 lb (81.647 kg)    PHYSICAL EXAM Filed Vitals:   07/17/14 1645 07/17/14 2146 07/18/14 0003 07/18/14 0624  BP: 105/59 110/56 101/51 110/80  Pulse: 111 59 46 50  Temp:  97.5 F (36.4 C)  97.3 F (36.3 C)  TempSrc:    Oral  Resp: 20 18  18   Height: 6\' 4"  (1.93 m)     Weight:      SpO2: 97% 95%  97%   General: Alert, oriented x3, no distress Head: no evidence of trauma, PERRL, EOMI, no exophtalmos or lid lag, no myxedema, no xanthelasma; normal ears, nose and oropharynx Neck: normal jugular venous pulsations and no hepatojugular reflux; brisk carotid pulses without delay and no carotid bruits Chest: clear to auscultation, no signs of consolidation by percussion or palpation, normal fremitus, symmetrical and full respiratory excursions Cardiovascular: normal position and quality of the apical impulse, regular rhythm, normal first and second heart sounds, no rubs or gallops, no murmur Abdomen: no tenderness or distention, no masses by  palpation, no abnormal pulsatility or arterial bruits, normal bowel sounds, no hepatosplenomegaly Extremities: no clubbing, cyanosis or edema; 2+ radial, ulnar and brachial pulses bilaterally; 2+ right femoral, posterior tibial and dorsalis pedis pulses; 2+ left femoral, posterior tibial and dorsalis pedis pulses; no subclavian or femoral bruits Neurological: grossly nonfocal  LABS  CBC  Recent Labs  07/17/14 1331 07/18/14 0552  WBC 5.5 6.1  NEUTROABS 4.0  --   HGB 15.7 14.3  HCT 48.3 45.2  MCV 91.8 93.2  PLT 142* 161*   Basic Metabolic Panel  Recent Labs  07/17/14 1331 07/18/14 0552  NA 140 144  K 3.9 4.1  CL 100 105  CO2 31 33*  GLUCOSE 103* 97  BUN 18 19  CREATININE 1.48* 1.62*  CALCIUM 8.4 8.3*   Liver Function Tests No results for input(s): AST, ALT, ALKPHOS, BILITOT, PROT, ALBUMIN in the last 72 hours. No results for input(s): LIPASE, AMYLASE in the last 72 hours. Cardiac Enzymes  Recent Labs  07/17/14 1850 07/18/14 0015 07/18/14 0552  TROPONINI 0.29* 0.33* 0.27*   Thyroid Function Tests  Recent Labs  07/17/14 1850  TSH 0.532    Radiology Studies Imaging results have been reviewed and Dg Chest 2 View  07/17/2014   CLINICAL DATA:  Shortness of breath, dizziness and weakness for 1 day. History atrial fibrillation and hypertension. Ex-smoker. Initial encounter.  EXAM: CHEST  2 VIEW  COMPARISON:  Radiographs 05/14/2012 and 04/26/2014.  FINDINGS: The heart size and mediastinal contours are stable. There are multiple calcified mediastinal lymph nodes which are unchanged. The lungs are clear. There is no pleural effusion or pneumothorax. The bones appear unchanged.  IMPRESSION: No active cardiopulmonary process. Sequela of prior granulomatous disease.   Electronically Signed   By: Richardean Sale M.D.   On: 07/17/2014 14:31    TELE Marked sinus bradycardia   ECG Atrial fibrillation rapid ventricular response, right bundle branch block , probably rate  related Episode of "wide complex tachycardia" around 0550h seems to be artifact. QRS "marches through" in second lead.  ASSESSMENT AND PLAN  1. Paroxysmal atrial fibrillation with rapid ventricular response, self terminated - discussed the need for potent anticoagulation to prevent stroke. There is a questionable previous history of TIA.CHADSVasc score is at least 3 (5 if one considers diagnosis of TIA). Note remote history of subdural hematoma while on treatment with clopidogrel and the past. Nevertheless, risk of stroke seems to outweigh risk of serious bleeding complications. He was minimally symptomatic during atrial fibrillation, aggressive antiarrhythmic therapy does not appear to be justified. Need to use caution with rate control medications due to a naturally slow resting heart rate in the 50s.  2.  sinus bradycardia - heart rate dropped to the 40s while asleep on intravenous diltiazem. Usually in the 50s when awake at home on his home blood pressure monitor. May do better with as needed diltiazem rather than a standing dose.  3. Abnormal cardiac troponin - "plateau" pattern is not consistent with coronary insufficiency. Outpatient evaluation for coronary disease is appropriate. Suggest Lexiscan Myoview since it is unlikely we will get a fast enough heart rate on treadmill.  Awaiting echo.   Sanda Klein, MD, [facility] [phone] office [phone] pager 07/18/2014 9:26 AM

## 2014-07-18 NOTE — Progress Notes (Signed)
Patient's HR dropped to 47, notified MD, cardizem drip was stopped. VSS BP 101/51, pt. Comfortable and currently resting, call bell within reach, RN will continue to monitor.  Candise Che, RN

## 2014-07-19 ENCOUNTER — Ambulatory Visit
Admission: RE | Admit: 2014-07-19 | Discharge: 2014-07-19 | Disposition: A | Discharge status: Home / Self Care | Payer: Commercial Managed Care - HMO | Source: Ambulatory Visit | Attending: Interventional Radiology | Admitting: Interventional Radiology

## 2014-07-19 ENCOUNTER — Ambulatory Visit (HOSPITAL_COMMUNITY)
Admission: RE | Admit: 2014-07-19 | Discharge: 2014-07-19 | Disposition: A | Discharge status: Home / Self Care | Payer: Commercial Managed Care - HMO | Source: Ambulatory Visit | Attending: Interventional Radiology | Admitting: Interventional Radiology

## 2014-07-19 ENCOUNTER — Encounter (HOSPITAL_COMMUNITY): Payer: Self-pay

## 2014-07-19 ENCOUNTER — Telehealth (HOSPITAL_COMMUNITY): Payer: Self-pay | Admitting: *Deleted

## 2014-07-19 ENCOUNTER — Other Ambulatory Visit (HOSPITAL_COMMUNITY): Payer: Self-pay | Admitting: Interventional Radiology

## 2014-07-19 DIAGNOSIS — N2889 Other specified disorders of kidney and ureter: Secondary | ICD-10-CM | POA: Insufficient documentation

## 2014-07-19 DIAGNOSIS — N135 Crossing vessel and stricture of ureter without hydronephrosis: Secondary | ICD-10-CM | POA: Diagnosis not present

## 2014-07-19 SURGERY — ECHOCARDIOGRAM, TRANSESOPHAGEAL
Anesthesia: Monitor Anesthesia Care

## 2014-07-19 MED ORDER — IOHEXOL 300 MG/ML  SOLN
100.0000 mL | Freq: Once | INTRAMUSCULAR | Status: AC | PRN
  Administered 2014-07-19: 100 mL via INTRAVENOUS

## 2014-07-19 NOTE — Progress Notes (Signed)
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  History of Present Illness: William [name] is an 81 y.o. 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 He was just discharged from the hospital last night after admission for new onset of atrial fibrillation with rapid ventricular response. He converted back to sinus rhythm after medical treatment and did not require cardioversion. He will be following up with Dr. Percival Spanish as an outpatient. He currently denies any symptoms.  Past Medical History  Diagnosis Date  . Hyperlipidemia   . Hx of colonic polyps   . Benign prostatic hypertrophy with urinary obstruction   . TIA (transient ischemic attack)   . Subdural hematoma     2003  . DJD (degenerative joint disease)   . Gout   . Malignant neoplasm of thyroid gland   . Primary skin squamous cell carcinoma   . Complication of anesthesia     "difficulty intubation, had to use fiberoptic 2006"  . Hypertension     sees [name]  . Thyroid disease     medullary thyroid   . Pneumonia     hx of in 1952  . Kidney tumor   . Parkinson disease     Past Surgical History  Procedure Laterality Date  . Cataract sugery    . Cholecystectomy    . Right inguinal hernia repair    . Thyroidectomy for medullary carcinoma    . Eye surgery    . Knee arthroscopy    . Appendectomy    . Tonsillectomy    . Lumbar laminectomy/decompression microdiscectomy  05/14/2012    Procedure: LUMBAR LAMINECTOMY/DECOMPRESSION MICRODISCECTOMY 1 LEVEL;  Surgeon: [name], MD;  Location: [facility];  Service: Neurosurgery;  Laterality: Left;  Left Lumbar four-five Laminectomy/Diskectomy/Far lateral diskectomy  . Kidney surgery  07-10-12  . Skin cancer removed  11/2013    Allergies: Review of patient's allergies indicates no known allergies.  Medications: Prior to Admission medications   Medication Sig  Start Date End Date Taking? Authorizing Provider  atorvastatin (LIPITOR) 20 MG tablet Take 1 tablet (20 mg total) by mouth daily. 07/18/14 07/18/15 Yes Florencia Reasons, MD  carbidopa-levodopa (SINEMET IR) 25-100 MG per tablet Take 1 tablet by mouth 4 (four) times daily. At 8, 12, 5 PM, and 9 or 10 PM 05/04/14  Yes Star Age, MD  cetirizine (ZYRTEC) 10 MG tablet Take 10 mg by mouth daily. 06/10/14  Yes Historical Provider, MD  diltiazem (CARDIZEM) 60 MG tablet Take 1 tablet (60 mg total) by mouth 2 (two) times daily as needed (palpitations). 07/18/14  Yes Florencia Reasons, MD  levothyroxine (SYNTHROID) 100 MCG tablet Take 1 tablet (100 mcg total) by mouth daily before breakfast. 07/18/14 07/18/15 Yes Florencia Reasons, MD  losartan (COZAAR) 25 MG tablet Take 1 tablet (25 mg total) by mouth daily. 07/18/14  Yes Florencia Reasons, MD  Multiple Vitamin (MULTIVITAMIN WITH MINERALS) TABS Take 1 tablet by mouth daily.   Yes Historical Provider, MD  Probiotic Product (ALIGN) 4 MG CAPS Take 1 capsule by mouth daily.   Yes Historical Provider, MD  rasagiline (AZILECT) 1 MG TABS tablet Take 1 tablet (1 mg total) by mouth daily. 05/04/14  Yes Star Age, MD  Rivaroxaban (XARELTO) 15 MG TABS tablet Take 1 tablet (15 mg total) by mouth daily with supper. 07/18/14  Yes Florencia Reasons, MD  tamsulosin (FLOMAX) 0.4 MG CAPS capsule Take 1 capsule (  0.4 mg total) by mouth daily. 07/18/14  Yes Florencia Reasons, MD  UNABLE TO FIND Triple antibiotic ointment 02/14/14  Yes Historical Provider, MD  Acetaminophen 500 MG coapsule  02/14/14   Historical Provider, MD    Family History  Problem Relation Age of Onset  . Stroke Mother   . Alzheimer's disease Brother   . Stroke Father   . Alzheimer's disease Sister     History   Social History  . Marital Status: Widowed    Spouse Name: N/A  . Number of Children: 3  . Years of Education: 15   Occupational History  . retired    Social History Main Topics  . Smoking status: Former Smoker -- 0.80 packs/day for 15 years    Quit date:  04/15/1965  . Smokeless tobacco: Never Used  . Alcohol Use: 0.0 oz/week    0 Standard drinks or equivalent per week     Comment: 1 glass of wine every night  . Drug Use: No  . Sexual Activity: Not on file   Other Topics Concern  . Not on file   Social History Narrative   Lives at home with significant other.      Review of Systems: A 12 point ROS discussed and pertinent positives are indicated in the HPI above.  All other systems are negative.  Review of Systems  Constitutional: Negative.   Respiratory: Negative.   Cardiovascular: Negative.   Gastrointestinal: Negative.   Genitourinary: Negative.   Neurological: Negative.     Vital Signs: BP 156/70 mmHg  Pulse 57  Temp(Src) 97.6 F (36.4 C)  Resp 20  Physical Exam  Constitutional: He is oriented to person, place, and time. He appears well-developed and well-nourished. No distress.  Abdominal: Soft. He exhibits no distension. There is no tenderness. There is no rebound and no guarding.  Neurological: He is alert and oriented to person, place, and time.  Skin: He is not diaphoretic.  Nursing note and vitals reviewed.   Imaging: Dg Chest 2 View  07/17/2014   CLINICAL DATA:  Shortness of breath, dizziness and weakness for 1 day. History atrial fibrillation and hypertension. Ex-smoker. Initial encounter.  EXAM: CHEST  2 VIEW  COMPARISON:  Radiographs 05/14/2012 and 04/26/2014.  FINDINGS: The heart size and mediastinal contours are stable. There are multiple calcified mediastinal lymph nodes which are unchanged. The lungs are clear. There is no pleural effusion or pneumothorax. The bones appear unchanged.  IMPRESSION: No active cardiopulmonary process. Sequela of prior granulomatous disease.   Electronically Signed   By: Richardean Sale M.D.   On: 07/17/2014 14:31   Ct Abd Wo & W Cm  07/19/2014   CLINICAL DATA:  Followup right renal neoplasm 2 years status post percutaneous cryoablation.  EXAM: CT ABDOMEN WITHOUT AND WITH  CONTRAST  TECHNIQUE: Multidetector CT imaging of the abdomen was performed following the standard protocol before and following the bolus administration of intravenous contrast.  CONTRAST:  132mL OMNIPAQUE IOHEXOL 300 MG/ML  SOLN  COMPARISON:  07/14/2013  FINDINGS: Lower chest: Nodular scarring in the medial right lower lobe remains stable compared to previous study.  Hepatobiliary: No masses or other significant abnormality identified. Prior cholecystectomy again noted. Chronic biliary ductal dilatation remains stable.  Pancreas: No mass, inflammatory changes, or other significant abnormality identified.  Spleen:  Within normal limits in size and appearance.  Adrenal Glands:  No masses identified.  Kidneys: Further mild contraction of mixed soft tissue and fat attenuation is seen in the cryoablation zone  in the lower pole of the right kidney. No evidence of recurrent renal mass. Other bilateral simple renal cysts are again seen in both kidneys, benign Bosniak category 1, which show no significant change since previous study. Severe right renal pelvicaliectasis is stable, without evidence of right ureteral dilatation, consistent with UPJ obstruction. No evidence of left-sided hydronephrosis.  Stomach/Bowel/Peritoneum: No evidence of wall thickening, mass, or obstruction involving visualized abdominal bowel. Colonic diverticulosis is demonstrated, without evidence of diverticulitis seen involving the visualized portion of the abdominal portion of the colon.  Vascular/Lymphatic: No pathologically enlarged lymph nodes identified. Mild infrarenal abdominal aortic aneurysm measures 3.9 x 3.4 cm and is mildly increased in size compared to 3.6 x 3.2 cm previously. No evidence of aneurysm leak or rupture.  Other:  None.  Musculoskeletal:  No suspicious bone lesions identified.  IMPRESSION: Further mild contraction of cryoablation zone in the lower pole the right kidney. No evidence of tumor recurrence or metastatic disease  within the abdomen.  Stable bilateral renal cysts and chronic right UPJ obstruction.  Prior cholecystectomy, with stable chronic biliary ductal dilatation.  Mild increase in size of 3.9 cm infrarenal abdominal aortic aneurysm. Recommend followup by ultrasound in 2 years. This recommendation follows ACR consensus guidelines: White Paper of the ACR Incidental Findings Committee II on Vascular Findings. J Am Coll Radiol 2013; 10:789-794.   Electronically Signed   By: Earle Gell M.D.   On: 07/19/2014 10:38    Labs:  CBC:  Recent Labs  12/21/13 1004 07/17/14 1331 07/18/14 0552  WBC 3.8* 5.5 6.1  HGB 15.0 15.7 14.3  HCT 44.2 48.3 45.2  PLT 150.0 142* 131*    COAGS:  Recent Labs  07/17/14 1331 07/17/14 1850  INR 1.13 1.19  APTT 30  --     BMP:  Recent Labs  12/21/13 1004 04/26/14 1605 07/15/14 1620 07/17/14 1331 07/18/14 0552  NA 139 137  --  140 144  K 3.6 4.2  --  3.9 4.1  CL 100 98  --  100 105  CO2 31 36*  --  31 33*  GLUCOSE 90 79  --  103* 97  BUN 23 21 21 18 19   CALCIUM 8.7 8.6  --  8.4 8.3*  CREATININE 1.4 1.4 1.31 1.48* 1.62*  GFRNONAA  --   --  50* 42* 37*  GFRAA  --   --  57* 48* 43*    LIVER FUNCTION TESTS:  Recent Labs  12/21/13 1004  BILITOT 1.2  AST 22  ALT 5  ALKPHOS 46  PROT 7.2  ALBUMIN 4.3    TUMOR MARKERS: No results for input(s): AFPTM, CEA, CA199, CHROMGRNA in the last 8760 hours.  Assessment and Plan:  CT today demonstrates some further retraction of tissue at the level of prior right-sided renal cryoablation. There is no evidence of recurrent enhancing tumor. Renal function is stable and normal. The CT today does show enlargement of a mild infrarenal aortic aneurysm. This was interpreted as currently measuring 3.4 x 3.9 cm. Previous aneurysm diameter was estimated to be 3.3 cm. I reviewed the CT images with William [name]. Based on my measurements, the aorta measured approximately 3.1 x 3.5 cm one year ago and at a comparable level  measures 3.4 x 3.7 cm on today's scan. The aorta measured approximately 3.0 x 3.3 cm in 2013. This represents approximately 2-3 mm of aneurysm enlargement over the last year and approximately 4 mm of enlargement since 2013. There also is a slightly thicker  amount of anterior mural thrombus at the level of the aneurysm which measures up to 7 mm in thickness.   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 The patient's blood pressure has been well-controlled since the recent hospital admission.  Given some gradual growth of the infrarenal, aortic aneurysm, I recommended that William [name] consult a vascular surgeon for continued follow-up. I recommended referral to Dr. Trula Slade. The patient is agreeable and we will contact Dr. Stephens Shire office. I recommended another follow-up CT of the abdomen to follow the renal ablation site in one year.  SignedAletta Edouard T 07/19/2014, 12:43 PM   I spent a total of 20 minutes face to face in clinical consultation, greater than 50% of which was counseling/coordinating care post right renal ablation.

## 2014-07-22 ENCOUNTER — Ambulatory Visit (INDEPENDENT_AMBULATORY_CARE_PROVIDER_SITE_OTHER): Payer: Medicare HMO | Admitting: Pulmonary Disease

## 2014-07-22 ENCOUNTER — Encounter: Payer: Self-pay | Admitting: Pulmonary Disease

## 2014-07-22 ENCOUNTER — Encounter: Payer: Self-pay | Admitting: Surgery

## 2014-07-22 VITALS — BP 120/70 | HR 50 | Temp 98.3°F | Wt 185.6 lb

## 2014-07-22 DIAGNOSIS — M159 Polyosteoarthritis, unspecified: Secondary | ICD-10-CM

## 2014-07-22 DIAGNOSIS — I7091 Generalized atherosclerosis: Secondary | ICD-10-CM

## 2014-07-22 DIAGNOSIS — I6200 Nontraumatic subdural hemorrhage, unspecified: Secondary | ICD-10-CM

## 2014-07-22 DIAGNOSIS — D3001 Benign neoplasm of right kidney: Secondary | ICD-10-CM

## 2014-07-22 DIAGNOSIS — I4891 Unspecified atrial fibrillation: Secondary | ICD-10-CM | POA: Diagnosis not present

## 2014-07-22 DIAGNOSIS — G458 Other transient cerebral ischemic attacks and related syndromes: Secondary | ICD-10-CM

## 2014-07-22 DIAGNOSIS — G20 Parkinson's disease: Secondary | ICD-10-CM | POA: Diagnosis not present

## 2014-07-22 DIAGNOSIS — I10 Essential (primary) hypertension: Secondary | ICD-10-CM | POA: Diagnosis not present

## 2014-07-22 DIAGNOSIS — M150 Primary generalized (osteo)arthritis: Secondary | ICD-10-CM

## 2014-07-22 DIAGNOSIS — C73 Malignant neoplasm of thyroid gland: Secondary | ICD-10-CM

## 2014-07-22 NOTE — Progress Notes (Signed)
Subjective:    Patient ID: William William [name] William [name], male    DOB: 12-04-1928, 81 y.o.   MRN: [id number]  HPI 81 y/o WM here for a follow up visit... he has multiple medical problems as noted below... followed for HBP, Carotid vasc dis w/ hx of TIA & prev subdural hematoma, Chol, & prev surg for medullary thyroid carcinoma... his wife passed away from metastatic breast cancer 11/09... 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  LABS 1/14 preop in EPIC> Chems- ok & Cr=1.3-1.5;  CBC- ok  LABS 8/14:  FLP- at goals on Simva20;  Chems- ok x Cr=1.5;  CBC- wnl;  TSH=0.54 & FreeT4=1.28 (0.6-1.60)...  ~  June 16, 2013:  11mo ROV & William William [name] William [name] describes some stress w/ girlfriends LBP/ GEXBM8/ complications... No new complaints or concerns... We reviewed the following medical problems during today's office visit >>     HBP> on Hyzaar100-12.5 & Cardura8-1/2; BP=120/58 today & denies CP, palpit, SOB, edema...    ASPVD> on ASA 81mg  daily w/o cerebral ischemic symptoms etc; he is off Plavix ever since his SDH...    Chol> on Simva40-1/2, Fish Oil & diet rx; FLP 8/14 shows TChol 128, TG 74, HDL 48, LDL 66    Hx medullary thyroid cancer> on Synthroid200-taking 1/2 alt w/1 Qod; followed by [facility] for [facility]; TFTs remain wnl w/ TSH 8/14 = 0.54 & FreeT4= 1.28 (0.6-1.60)    GI- Divertics, HxPolyps> last colon 2007 by DrSamLeB showed divertics, hems, no polyps & f/u suggested 66yrs but now>82y/o...    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    DJD, LBP> on Vicodin prn; he developed LBP after lifting a mattress 5/13; eval by DrBrooks & DrHirsh w/ MRI reported to show HNP at L4-5 (w/ myelopathy & radiculopathy); DrHirsh did LumbarLam 1/14 w/ decompression L4 & L5 w/ diskectomy=> improved...    Neuro- TIA, Hx subdural hemtoma, Tremor/Parkinson's> now followed by DrAthar for Neuro on Sinemet25-100Tid & Azilect1mg - he reports doing better overall...    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 We reviewed prob list, meds, xrays and labs> see below for updates >> Rx for shingles vax...  CT Abd showed further contraction of the cryoablation zone within the lower part of the right kidney, no enhancement, bilat renal cysts, chr right UPJ stenosis; Incidentally noted [place], mult splenic granuloma, diffuse Ao atherosclerosis & dilatation of infrarenal Ao to 3.3cm, DJD in lumbar spine...   ~  December 21, 2013:  56mo ROV & William William [name] William [name] describes a good interval, his stress is diminished & he's planning a river cruise;  We reviewed the following medical problems during today's office visit >>     HBP> on Hyzaar100-12.5 & Cardura8-1/2; BP=110/70 today & denies CP, palpit, SOB, edema...    ASPVD> on ASA 81mg  daily w/o cerebral ischemic  symptoms etc; he is off Plavix ever since his SDH...    Chol> on Simva40-1/2, Fish Oil & diet rx; FLP 9/15 shows TChol 131, TG 72, HDL 50, LDL 67    Hx medullary thyroid cancer> on Synthroid150; followed by [facility] for CCS; TFTs remain wnl w/ TSH 9/15 = 0.54 & FreeT3=3.0 (2.3-4.2) & FreeT4= 1.31 (0.6-1.60)    GI- Divertics, HxPolyps> on Align; last colon 2007 by DrSamLeB showed divertics, hems, no polyps & f/u suggested 49yrs but now>82y/o...    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    DJD, LBP> off Vicodin; he developed LBP after lifting a mattress 5/13; eval by DrBrooks & DrHirsh w/ MRI reported to show HNP at L4-5 (w/ myelopathy & radiculopathy); DrHirsh did LumbarLam 1/14 w/ decompression L4 & L5 w/ diskectomy=> improved...    Neuro- TIA, Hx subdural hemtoma, Tremor/Parkinson's> now followed by DrAthar for Neuro on Sinemet25-100Tid & Azilect1mg - he reports doing better overall...    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 We reviewed prob list, meds, xrays and labs> see below for updates >> OK Flu shot today...  LABS 9/15:  FLP- at goals on Simva20;  Chems- ok x Cr=1.4;  CBC- wnl;  TSH=0.54;  FreeT3 & FreeT4- wnl...  ~  April 26, 2014:  27mo ROV & add-on appt requested for HBP> he has been well controlled on Cardura8- 1/2 daily and Hyzaar100-12.5; over the last week his home BP checks were in the 200/100 range and [name] notes that "he feels it"; pt states that his head feels heavy- denies CP, palpit, SOB, edema; I note that wt is up 7# to 186# today, he denies eating salt; BP here today= 132/84 & they didn't bring his cuff; we decided to check CXR (norm heart size, NAD) and BMet (ok x HCO3=36, Cr=1.4); we decided to incr Cardura to 8mg /d and f/u 43mo; get on diet & gety wt back down; he will call in the interim for concerns...    We reviewed prob list, meds, xrays and labs> see below for updates >>   CXR 1/16 showed norm heart size, clear hyperinflated lungs, stable mediastinal calcif, NAD...  LABS 1/16:  Chems- ok x HCO3=36 Cr=1.4.Marland KitchenMarland Kitchen PLAN>>  We decided to incr his Cardura to 8mg /d; he will continue the Hyzaar & monitor BP at home, ROV 9mo, call for questions...   ~  July 22, 2014:  68mo ROV & post hospital check> William William [name] William [name] was Adm 4/3 - 07/18/14 by [facility] w/ AFib & RVR, after waking up weak,  felt "funny", BP was low; In ER found AFib/ rvr, no CP, & he converted to NSR w/ Cardizem drip; CHA2DS2-VASc score= 5 for age>75, hx of TIA, male, HBP & started on Xarelto15/d (due to hx subdural in past)... Note: mult other changes to is meds during this brief hosp> he goes to the [facility] for his med refills...    HBP> on Losar25 & Cardizem60Bid; BP=120/70 today & denies CP, palpit, SOB, edema... Continue these new meds...    PAF> adm 4/16 w/ PAF &rvr; converted spont to NSR & meds adjusted, disch on Xarelto15/d...    ASPVD> off ASA 81 now since Xarelto started; denies cerebral ischemic symptoms etc; IR found ~4cm AAA on  f/u CTAbd 4/16 & he has appt w/ DrBrabham...Marland KitchenMarland KitchenMarland Kitchen    Chol> on Lip20 now, Fish Oil & diet rx; FLP 9/15 (on Simva20) shows TChol 131, TG 72, HDL 50, LDL 67    Hx medullary thyroid cancer> on Synthroid100 now; followed by [facility] for CCS; TFTs were wnl on Levothy150 w/ TSH 9/15 = 0.54 & FreeT3=3.0 (2.3-4.2) & FreeT4= 1.31 (0.6-1.60); dose decr to 12mcg/d by Cards due to PAF 4/16.    GI- Divertics, HxPolyps> on Align; last colon 2007 by DrSamLeB showed divertics, hems, no polyps & f/u suggested 31yrs but now >82y/o...    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    DJD, LBP> off Vicodin, on Tylenol; he developed LBP after lifting a mattress 5/13; eval by DrBrooks & DrHirsh w/ MRI reported to show HNP at L4-5 (w/ myelopathy & radiculopathy); DrHirsh did LumbarLam 1/14 w/ decompression L4 & L5 w/ diskectomy=> improved...    Neuro- TIA, Hx subdural hemtoma, Tremor/Parkinson's> now followed by DrAthar for Neuro on Sinemet25-100Qid & Azilect1mg /d- seen  04/2014 & he reports stable...    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  CXR 4/16 showed norm heart size, clear lungs, mult calcif mediastinal LNs (old gran dis), NAD...   2DEcho 4/16 showed mild LVH, norm LVF=60-65% & no regional wall motion abn, Gr1DD, valves OK, LA=63mm, mod RV dil...   LABS 4/16:  Reviewed- Cr=1.3-1.6, sl elev troponins, TSH=0.532...  IMP/ PLAN:  Brief episode of PAF, converted spont to NSR, started on Xarelto15 for elev risk (but has hx SDH as well) and mult other meds adjusted as noted;  He is feeling better overall & awaiting f/u appt w/ Cards- DrHochrein;  He had [facility] & his cryoablation zone lower pole right kidney is further sl improved but there was a 4cm AAA & he has appt w/ DrBrabham to follow this... He will need f/u FLP, BMet, & TSH on return...           Problem List:  HYPERTENSION (ICD-401.9) - controlled on HYZAAR 100/25 daily, & CARDURA 8mg - taking 1/2 daily... ~  2/13:  BP= 122/60 & he denies CP, palpit, SOB, edema, etc... ~  8/13:  BP= 140/80 & he remains largely asymptomatic... ~  CXR 1/14 showed normal heart size, ectatic & calcif Ao, low lying diaph, clear lungs, NAD... ~  EKG 1/14 showed NSR, rate61,  Poor R prog V1-3, NSSTTWA... ~  2/14: on Hyzaar100-12.5 & Cardura8; BP=138/80 today & denies CP, palpit, SOB, edema. ~  3/15: on Hyzaar100-12.5 & Cardura8-1/2; BP=120/58 today & he remains asymptomatic... ~  9/15: on Hyzaar100-12.5 & Cardura8-1/2; BP=110/70 today & denies CP, palpit, SOB, edema... ~  1/16: he presented w/ BP up to 200/100 at home; it was 132/84 here & we decided to incr Cardura to 8mg /d, recheck 37mo... ~  CXR 1/16 showed norm heart size, clear hyperinflated lungs, stable mediastinal calcif, NAD ~  4/16:  He was hosp this month w/ PAF> now on [place]; BP=120/70 today & denies CP, palpit, SOB, edema... Continue these new  meds.  PAF >> he was adm 4/3-07/2014 w/ PAF & rvr, converted w/ Cardizem drip & meds adjusted- disch on Xarelto15 & Cardizem60Bid, holding NSR & doing satis...  ATHEROSCLEROTIC VASCULAR DISEASE (ICD-440.9) - on ASA 81mg /d;  Prev Plavix was discontinued after his subdural hematoma in 2006. ~  Eval 2003 by [facility] for TIA showed cerebrovasc dis w/ R>L PCA stenoses... ~  4/15: f/u CT Abd (f/u cryoablation of renal oncocytoma) also showed 3.3cm AAA being followed... ~  4/16: f/u CT Abd by IR, DrYamagata showed further contraction of the cryoablation defect w/ other changes persisting & AAA now 3.9cm in size=> he's been referred to DrBrabham to establish care...  HYPERLIPIDEMIA (ICD-272.4) - on SIMVASTATIN 40mg -taking 1/2 + FISH OIL 1000mg /d> doing well. ~  FLP 12/07 on Zocor showed TChol 137, HDL 47, LDL 70 ~  FLP 7/09 off med showed TChol 196, TG 95, HDL 49, LDL 128... rec> restart Zocor 40mg . ~  FLP 7/10 showed TChol 135, TG 47, HDL 53, LDL 73... He decr dose to 1/2 tab on his own... ~  [place] 8/11 showed TChol 138, TG 73, HDL 49, LDL 75 ~  FLP 8/12 showed TChol 125, TG 69, HDL 56, LDL 56 ~  FLP 2/13 showed TChol 133, TG 69, HDL 61, LDL 59 ~  FLP 8/13 showed TChol 137, TG 73, HDL 53, LDL 70 ~  FLP 8/14 on Simva20 showed  TChol 128, TG 74, HDL 48, LDL 66 ~  FLP 9/15 on Simva20 showed TChol 131, TG 72, HDL 50, LDL 67  ~  4/16:  Now on Atorva20...  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 ~  labs 7/09 showed TSH= 3.47... on SYNTHROID 285mcg/d ~  labs 7/10 showed TSH= 2.95 ~  labs 8/11 showed TSH= 1.20 ~  Labs 8/12 showed TSH= 0.23 & pt rec to decr the Synthroid200 to only 1/2 on MWF.Marland Kitchen. ~  Labs 2/13 showed TSH= 2.29 on Synthroid200- taking 1x4d=TThSS and 1/2 x3d=MWF.Marland Kitchen. ~  Labs 8/13 showed TSH= 0.69... On same dose. ~  Labs 8/14 on Synthroid200-taking 1 alt w/ 1/2 qod showed TSH=0.54, Free T4=1.28 ~  3/15: we  discussed change in Synthroid to 183mcg/d... ~  Labs 9/15 on Synthroid150 showed TSH = 0.54 & FreeT3=3.0 (2.3-4.2) & FreeT4= 1.31 (0.6-1.60) ~  4/16:  He is now on Synthroid 120mcg/d since hosp for PAF 4/16...  COLONIC POLYPS (ICD-211.3) - last colonoscopy 1/07 by DrSam showed divertics & hems only... f/u planned 75yrs.  BENIGN PROSTATIC HYPERTROPHY, WITH OBSTRUCTION (ICD-600.01) - he sees DrDavis for Urology once a year... he has a brother who had prostate cancer...  ~  PSA followed by RSWNIOEV yrearly... 7/10 pt reports PSA>5 (was 4.17 here) & Rx w/ Cipro... ~  he had f/u Urology eval DrGrapey 5/11- pt indicates that his PSA was OK... 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 ~  2/14:  followed by DrGrapey & DrYamagata re-imaged the lesion via CT 1/14 w/ growth evident & he has rec Cryoablation=> pending due to need for LLam first... ~  3/14:  he underwent cryoablation of right renal oncocytic tumor by DrYamagata (pt reports painful procedure) ~  5/14:  f/u visit w/ IR & CT Abd=> 2 left renal cysts & lower pole right renal cyst, cryoablation zone in inferior pole right kidney measures 4x3cm; atherosclerotic changes in Ao, mult splenic granulomata, prom CBD... ~  9/14: f/u visit w/ IR, DrYamagata> s/p percut cryoablation of right renal  oncocytic tumor 3/14; doing well, CTshowed expected post ablation changes, no resid or recurrent tumor suspected...  ~  1/15: f/u visit w/ DrGrapey> doing well from the renal tumor standpoint, BPH, LTOS, ED, etc... ~  4/14: he had f/u DrYamagata> doing well, asymptomatic- BUN=17, Cr=1.21, f/u CT showed further retraction of right sided cryoablation defect, no abnormal enhancement, bilat renal cysts... ~  4/15: he had yearly f/u w/ DrYamagata> CT Abd showed further contraction of the cryoablation zone in the lower pole of the right kidney, no recurrent tumor, stable bilat renal cysts, chr right UPJ stenosis, 3.3cm AAA noted... ~  4/16: he had yearly f/u by IR> CT Abd showed post ablation changes w/ contraction of the cryoablation zone in lower pole of right kidney & no enhancement, mild infrarenal AAA ~4cm & he has appt w/ DrBrabham soon                TRANSIENT ISCHEMIC ATTACK (ICD-435.9) - hx of recurrent right brain TIA's in 2003... initially on ASA + Plavix and the Plavix was stopped after his subdural in 2006...  Hx of SUBDURAL HEMATOMA (ICD-432.1) - hosp 7/06 w/ left subdural hematoma req craniotomy by DrHirsh for evacuation... no known trauma- he was on ASA/ Plavix and developed a headache... he also take DILANTIN 100mg  Tid now per [facility] (for a partial seizure characterized by aphasia)... ~  7/10: pt tells me he has decreased his Dilantin to Bid and will f/u w/ DrLove. ~  8/11:  we don't have any recent notes from [facility], but pt is off the Dilantin rx.  TREMOR (ICD-781.0) - he notes mild tremor right hand & arm> eval from Neurology- Gean Quint- indicated prob early [name] and after a period of observation they started Mirapex (stopped due to hypotension), changed to [facility] 2/12> dosed per DrLove. ~  8/11: pt tells me that he has started accupuncture treatments in [place]==> no benefit. ~  2/12:  pt indicates that DrLove stopped the Mirapex in favor of SINEMET 25/100- taking 1/2 Tid. ~   8/12:  Pt tells me he has seen DrScott, Neurology at [place] (?referred by [facility], ?second opinion), we don't have records, pt reports no change in meds (he considered entering a drug study). ~  He continues to f/u w/ DrLove every 4-28months... ~  8/13:  He indicates that Drlove recently increased the SINEMET 25/100 to 1 tab Tid... ~  8/14:  He had Neuro f/u DrAthar> note reviewed & pt stable on Sinemet Tid + Azilect 1mg /d (added 5/14 by Neuro)...  DEGENERATIVE JOINT DISEASE (ICD-715.90) - he notes some knee discomfort & he takes Aleve & HYDROCODONE as needed... LBP >> injured back lifting mattress 5/13; eval by DrBrooks & DrHirsh w/ abn MRI (done by Ortho at their office) &  disc herniation L4-5 by report; Pred w/o help, ESI helped some, using pain Rx & holding off on surg per DrHirsh... ~  1/14: on Vicodin prn; eval by DrBrooks & DrHirsh w/ MRI reported to show HNP at L4-5 (w/ myelopathy & radiculopathy); DrHirsh did LumbarLam 1/14 w/ decompression L4 & L5 w/ discectomy=> improved...  Hx of CARCINOMA, SKIN, SQUAMOUS CELL (ICD-173.9) - s/p removal of skin cancer from his right forearm 11/06 by DrLupton... ~  9/15: he reports another skin cancer removed...  Health Maintenance:  he had PNEUMOVAX vaccine in 1998 & 1/11... gets yearly Flu vaccine every fall... TDAP given 8/11...   Past Surgical History  Procedure Laterality Date  . Cataract sugery    . Cholecystectomy    . Right inguinal hernia repair    . Thyroidectomy for medullary carcinoma    . Eye surgery    . Knee arthroscopy    . Appendectomy    . Tonsillectomy    . Lumbar laminectomy/decompression microdiscectomy  05/14/2012    Procedure: LUMBAR LAMINECTOMY/DECOMPRESSION MICRODISCECTOMY 1 LEVEL;  Surgeon: [name], MD;  Location: [place] [facility]UR[facility];  Service: Neurosurgery;  Laterality: Left;  Left Lumbar four-five Laminectomy/Diskectomy/Far lateral diskectomy  . Kidney surgery  07-10-12  . Skin cancer removed  11/2013    Outpatient  Encounter Prescriptions as of 07/22/2014  Medication Sig  . Acetaminophen 500 MG coapsule   . atorvastatin (LIPITOR) 20 MG tablet Take 1 tablet (20 mg total) by mouth daily.  . carbidopa-levodopa (SINEMET IR) 25-100 MG per tablet Take 1 tablet by mouth 4 (four) times daily. At 8, 12, 5 PM, and 9 or 10 PM  . cetirizine (ZYRTEC) 10 MG tablet Take 10 mg by mouth daily.  Marland Kitchen diltiazem (CARDIZEM) 60 MG tablet Take 1 tablet (60 mg total) by mouth 2 (two) times daily as needed (palpitations).  Marland Kitchen levothyroxine (SYNTHROID) 100 MCG tablet Take 1 tablet (100 mcg total) by mouth daily before breakfast.  . losartan (COZAAR) 25 MG tablet Take 1 tablet (25 mg total) by mouth daily.  . Multiple Vitamin (MULTIVITAMIN WITH MINERALS) TABS Take 1 tablet by mouth daily.  . Probiotic Product (ALIGN) 4 MG CAPS Take 1 capsule by mouth daily.  . rasagiline (AZILECT) 1 MG TABS tablet Take 1 tablet (1 mg total) by mouth daily.  . Rivaroxaban (XARELTO) 15 MG TABS tablet Take 1 tablet (15 mg total) by mouth daily with supper.  . tamsulosin (FLOMAX) 0.4 MG CAPS capsule Take 1 capsule (0.4 mg total) by mouth daily.  Marland Kitchen UNABLE TO FIND Triple antibiotic ointment    No Known Allergies   Current Medications, Allergies, Past Medical History, Past Surgical History, Family History, and Social History were reviewed in [employer] record.    Review of Systems       See HPI - all other systems neg except as noted...       The patient complains of dyspnea on exertion, right hand tremor, and difficulty walking.  The patient denies anorexia, fever, weight loss, weight gain, vision loss, decreased hearing, hoarseness, chest pain, syncope, peripheral edema, prolonged cough, headaches, hemoptysis, abdominal pain, melena, hematochezia, severe indigestion/heartburn, hematuria, incontinence, muscle weakness, suspicious skin lesions, transient blindness, depression, unusual weight change, abnormal bleeding, enlarged lymph  nodes, and angioedema.     Objective:   Physical Exam     WD, WN, 81 y/o WM in NAD... GENERAL:  Alert & oriented; pleasant & cooperative... HEENT:  [place]/AT, EOM-wnl, PERRLA, EACs-clear, TMs-wnl, NOSE-clear, THROAT-clear &  wnl. NECK:  Supple w/ fairROM; no JVD; normal carotid impulses w/o bruits; thyroid area scar, no nodules palp; no lymphadenopathy. CHEST:  Clear to P & A; without wheezes/ rales/ or rhonchi heard... HEART:  Regular Rhythm; without murmurs/ rubs/ or gallops detected... ABDOMEN:  Soft & nontender; normal bowel sounds; no organomegaly or masses palpated... BACK:  Scar of LLam surg... EXT: without deformities, mild arthritic changes; no varicose veins/ +venous insuffic/ no edema. NEURO:  CN's intact;  mod tremor right hand & arm at rest, ?early mask facies, still moves well. DERM:  No lesions noted; no rash etc...  RADIOLOGY DATA:  Reviewed in the EPIC EMR & discussed w/ the patient...  LABORATORY DATA:  Reviewed in the EPIC EMR & discussed w/ the patient...   Assessment & Plan:    PAF>> Brief episode of PAF, converted spont to NSR, started on Xarelto15 for elev risk (but has hx SDH as well) and mult other meds adjusted as noted;  He is feeling better overall & awaiting f/u appt w/ Cards- DrHochrein;  He had f/u [facility]Abd by [facility] & his cryoablation zone lower pole right kidney is further sl improved but there was a 4cm AAA & he has appt w/ DrBrabham to follow this... He will need f/u FLP, BMet, & TSH on return...   HBP>  He presented 1/16 c/o BP 200/100 at home but it was 132/84 here- we incr Cardura to 8mg /d; Hosp 4/16 w/ PAF & all meds adjusted- now on Losar & Cardizem w/ controlled BP.  PVD- carotids and AAA>  Off ASA now on Xarelto15- w/o cerebral ischemic symptoms, & he has appt w/ DrBrabham...  HYPERLIPID>  On Atorva20 now + Fish Oil; FLP was good 9/15 on Simva20 at that time...  Hx Medullary Thyroid Carcinoma>  S/p surg by DrNewman2002, on Synthroid 100mg /d now  & TSH will need to f/u on return...  GI> remote hx polyp, divertics, hems>  Last colon 2007 by by DrSam & suggested f/u 48yrs...  GU>  Followed by DrGrapey now, RENAL LESION found incidentally on MRI Lumbar spine & w/u revealed oncocytoma w/ cryoablation per [facility] 3/14 & stable on f/u visits/ scans...  NEURO> Hx TIA, Hx SDH, Tremor/ Parkinson's>  Followed by Gean Quint & now DrAthar on Sinemet & Azilect, improved...  DJD, LBP>  He had decompressive LLam 1/14 by DrHirsh & is recovering nicely...  Other medical issues as noted...   Patient's Medications  New Prescriptions   No medications on file  Previous Medications   ACETAMINOPHEN 500 MG COAPSULE       ATORVASTATIN (LIPITOR) 20 MG TABLET    Take 1 tablet (20 mg total) by mouth daily.   CARBIDOPA-LEVODOPA (SINEMET IR) 25-100 MG PER TABLET    Take 1 tablet by mouth 4 (four) times daily. At 8, 12, 5 PM, and 9 or 10 PM   DILTIAZEM (CARDIZEM) 60 MG TABLET    Take 1 tablet (60 mg total) by mouth 2 (two) times daily as needed (palpitations).   LEVOTHYROXINE (SYNTHROID) 100 MCG TABLET    Take 1 tablet (100 mcg total) by mouth daily before breakfast.   LOSARTAN (COZAAR) 25 MG TABLET    Take 1 tablet (25 mg total) by mouth daily.   MULTIPLE VITAMIN (MULTIVITAMIN WITH MINERALS) TABS    Take 1 tablet by mouth daily.   PROBIOTIC PRODUCT (ALIGN) 4 MG CAPS    Take 1 capsule by mouth daily.   RASAGILINE (AZILECT) 1 MG TABS TABLET    Take  1 tablet (1 mg total) by mouth daily.   RIVAROXABAN (XARELTO) 15 MG TABS TABLET    Take 1 tablet (15 mg total) by mouth daily with supper.   TAMSULOSIN (FLOMAX) 0.4 MG CAPS CAPSULE    Take 1 capsule (0.4 mg total) by mouth daily.   UNABLE TO FIND    Triple antibiotic ointment  Modified Medications   No medications on file  Discontinued Medications   CETIRIZINE (ZYRTEC) 10 MG TABLET    Take 10 mg by mouth daily.

## 2014-07-23 ENCOUNTER — Encounter: Payer: Self-pay | Admitting: Pulmonary Disease

## 2014-07-23 NOTE — Patient Instructions (Signed)
Today we updated your med list in our EPIC system...    Continue your current medications the same & as amended during your recent hosp...  Call for any questions...  Let's plan a follow up visit in 3-25mo, sooner if needed for problems.Marland KitchenMarland Kitchen

## 2014-08-02 ENCOUNTER — Telehealth (HOSPITAL_COMMUNITY): Payer: Self-pay

## 2014-08-02 NOTE — Telephone Encounter (Signed)
Encounter complete. 

## 2014-08-03 ENCOUNTER — Telehealth: Payer: Self-pay | Admitting: Pulmonary Disease

## 2014-08-03 ENCOUNTER — Telehealth (HOSPITAL_COMMUNITY): Payer: Self-pay

## 2014-08-03 NOTE — Telephone Encounter (Signed)
Patient has appointment tomorrow morning for a Cardio Stress Test.  Patient said that his cardiologist referred him for the test.  He wanted to know since he has [employer] if he should get the referral through Dr. Lenna Gilford.  Please advise.

## 2014-08-03 NOTE — Telephone Encounter (Signed)
Encounter complete. 

## 2014-08-04 ENCOUNTER — Ambulatory Visit (HOSPITAL_COMMUNITY)
Admission: RE | Admit: 2014-08-04 | Discharge: 2014-08-04 | Disposition: A | Discharge status: Home / Self Care | Payer: Medicare HMO | Source: Ambulatory Visit | Attending: Cardiology | Admitting: Cardiology

## 2014-08-04 DIAGNOSIS — Z87891 Personal history of nicotine dependence: Secondary | ICD-10-CM | POA: Insufficient documentation

## 2014-08-04 DIAGNOSIS — R9431 Abnormal electrocardiogram [ECG] [EKG]: Secondary | ICD-10-CM | POA: Insufficient documentation

## 2014-08-04 DIAGNOSIS — I10 Essential (primary) hypertension: Secondary | ICD-10-CM | POA: Diagnosis not present

## 2014-08-04 DIAGNOSIS — G20 Parkinson's disease: Secondary | ICD-10-CM | POA: Insufficient documentation

## 2014-08-04 DIAGNOSIS — E119 Type 2 diabetes mellitus without complications: Secondary | ICD-10-CM | POA: Insufficient documentation

## 2014-08-04 DIAGNOSIS — R778 Other specified abnormalities of plasma proteins: Secondary | ICD-10-CM

## 2014-08-04 DIAGNOSIS — R5383 Other fatigue: Secondary | ICD-10-CM | POA: Diagnosis not present

## 2014-08-04 DIAGNOSIS — R002 Palpitations: Secondary | ICD-10-CM | POA: Diagnosis not present

## 2014-08-04 DIAGNOSIS — R42 Dizziness and giddiness: Secondary | ICD-10-CM | POA: Insufficient documentation

## 2014-08-04 DIAGNOSIS — R7989 Other specified abnormal findings of blood chemistry: Secondary | ICD-10-CM | POA: Diagnosis not present

## 2014-08-04 MED ORDER — REGADENOSON 0.4 MG/5ML IV SOLN
0.4000 mg | Freq: Once | INTRAVENOUS | Status: AC
  Administered 2014-08-04: 0.4 mg via INTRAVENOUS

## 2014-08-04 MED ORDER — TECHNETIUM TC 99M SESTAMIBI GENERIC - CARDIOLITE
10.2000 | Freq: Once | INTRAVENOUS | Status: AC | PRN
  Administered 2014-08-04: 10 via INTRAVENOUS

## 2014-08-04 MED ORDER — TECHNETIUM TC 99M SESTAMIBI GENERIC - CARDIOLITE
30.9000 | Freq: Once | INTRAVENOUS | Status: AC | PRN
  Administered 2014-08-04: 30.9 via INTRAVENOUS

## 2014-08-04 NOTE — Telephone Encounter (Signed)
Per SN, not sure of insurance requirements. Spoke with Daleen Snook in regards to matter. Per Daleen Snook, his insurance with [employer] does not require referral to come from SN. It is fine that the cardiologist ordered test. Please inform pt. Thanks    Pt is aware. Nothing further was needed.

## 2014-08-04 NOTE — Procedures (Addendum)
William [name] [address] [place] [address] [phone]  Cardiology Nuclear Med Study  William [name] is [name] 81 y.o. male     MRN : [id number]     DOB: 09-Apr-1929  Procedure Date: 08/04/2014  Nuclear Med Background Indication for Stress Test:  Evaluation for Ischemia, [facility], Abnormal EKG and Elevated troponin;New Onset AFIB History:  Last NUC MPI in 1990;Unknown EF; No furter prior cardiac history reported;No prior respiratory history reported Cardiac Risk Factors: History of Smoking, Hypertension, NIDDM, TIA and HX subdural hematoms;HX Parkinsons  Symptoms:  Dizziness, Fatigue, Light-Headedness and Palpitations   Nuclear Pre-Procedure Caffeine/Decaff Intake:  9:30pm NPO After: 5:30am   IV Site: R Forearm  IV 0.9% NS with Angio Cath:  22g  Chest Size (in):  44" IV Started by: Larene Beach, RN  Height: 6\' 4"  (1.93 m)  Cup Size: [name]  BMI:  Body mass index is 21.92 kg/(m^2). Weight:  180 lb (81.647 kg)   Tech Comments:  [name]    Nuclear Med Study 1 or 2 day study: 1 day  Stress Test Type:  [place] Provider:  Minus Breeding, MD   Resting Radionuclide: Technetium 21m Sestamibi  Resting Radionuclide Dose: 10.2 mCi   Stress Radionuclide:  Technetium 50m Sestamibi  Stress Radionuclide Dose: 30.9 mCi           Stress Protocol Rest HR: 51 Stress HR: 53  Rest BP: 174/113 Stress BP: 173/112  Exercise Time (min): [name] METS: [name]          Dose of Adenosine (mg):  [name] Dose of Lexiscan: 0.4 mg  Dose of Atropine (mg): [name] Dose of Dobutamine: [name] mcg/kg/min (at max HR)  Stress Test Technologist: Leane Para, CCT Nuclear Technologist:Elizabeth Young,CNMT   Rest Procedure:  Myocardial perfusion imaging was performed at rest 45 minutes following the intravenous administration of Technetium 57m Sestamibi. Stress Procedure:  The patient received IV Lexiscan 0.4 mg over 15-seconds.  Technetium 55m  Sestamibi IV injected at 30-seconds.  There were no significant changes with Lexiscan.  Quantitative spect images were obtained after [name] 45 minute delay.  Transient Ischemic Dilatation (Normal <1.22):  1.04  QGS EDV:  104 ml QGS ESV:  45 ml LV Ejection Fraction: 57%    Rest ECG: NSR-RBBB  Stress ECG: No significant change from baseline ECG  QPS Raw Data Images:  Normal; no motion artifact; normal heart/lung ratio. Stress Images:  Normal homogeneous uptake in all areas of the myocardium. Rest Images:  Normal homogeneous uptake in all areas of the myocardium. Subtraction (SDS):  Normal  Impression Exercise Capacity:  Lexiscan with no exercise. BP Response:  Baseline hypertension; nomal BP response Clinical Symptoms:  No significant symptoms noted. ECG Impression:  No significant ST segment change suggestive of ischemia. Comparison with Prior Nuclear Study: No images to compare  Overall Impression:  Normal stress nuclear study.  LV Wall Motion:  NL LV Function, EF 57%; NL Wall Motion   William Sen A, MD  08/04/2014 1:00 PM

## 2014-08-05 ENCOUNTER — Telehealth: Payer: Self-pay | Admitting: Cardiology

## 2014-08-05 NOTE — Telephone Encounter (Signed)
Patient is returning a call regarding his stress test results

## 2014-08-08 ENCOUNTER — Telehealth: Payer: Self-pay | Admitting: Pulmonary Disease

## 2014-08-08 DIAGNOSIS — Z85820 Personal history of malignant melanoma of skin: Secondary | ICD-10-CM

## 2014-08-08 MED ORDER — LEVOTHYROXINE SODIUM 100 MCG PO TABS: 100.0000 ug | ORAL_TABLET | Freq: Every day | ORAL | Status: DC

## 2014-08-08 NOTE — Telephone Encounter (Signed)
Pt advised that refill for Levothyroxine was sent to [facility] order

## 2014-08-08 NOTE — Telephone Encounter (Signed)
Please advise Dr Lenna Gilford - pt is requesting a referral to Dermatology to see Dr Allyson Sabal.  Thanks.

## 2014-08-08 NOTE — Telephone Encounter (Signed)
Per SN, referral for dermatologist placed due to a history of melanoma to the skin. Pt called and aware that referral has been placed. Nothing further is needed at this time.

## 2014-08-08 NOTE — Telephone Encounter (Signed)
Message printed and placed on  SN cart for review  

## 2014-08-09 ENCOUNTER — Encounter (HOSPITAL_COMMUNITY): Payer: Self-pay

## 2014-08-09 ENCOUNTER — Emergency Department (HOSPITAL_COMMUNITY): Payer: Medicare HMO

## 2014-08-09 ENCOUNTER — Observation Stay (HOSPITAL_COMMUNITY): Payer: Medicare HMO

## 2014-08-09 ENCOUNTER — Observation Stay (HOSPITAL_COMMUNITY)
Admission: EM | Admit: 2014-08-09 | Discharge: 2014-08-10 | Disposition: A | Discharge status: Home / Self Care | Payer: Medicare HMO | Attending: Internal Medicine | Admitting: Internal Medicine

## 2014-08-09 DIAGNOSIS — M1990 Unspecified osteoarthritis, unspecified site: Secondary | ICD-10-CM | POA: Insufficient documentation

## 2014-08-09 DIAGNOSIS — G20 Parkinson's disease: Secondary | ICD-10-CM | POA: Insufficient documentation

## 2014-08-09 DIAGNOSIS — N183 Chronic kidney disease, stage 3 unspecified: Secondary | ICD-10-CM | POA: Diagnosis present

## 2014-08-09 DIAGNOSIS — I4891 Unspecified atrial fibrillation: Secondary | ICD-10-CM | POA: Diagnosis not present

## 2014-08-09 DIAGNOSIS — E785 Hyperlipidemia, unspecified: Secondary | ICD-10-CM | POA: Diagnosis not present

## 2014-08-09 DIAGNOSIS — Z87891 Personal history of nicotine dependence: Secondary | ICD-10-CM | POA: Diagnosis not present

## 2014-08-09 DIAGNOSIS — I10 Essential (primary) hypertension: Secondary | ICD-10-CM | POA: Diagnosis not present

## 2014-08-09 DIAGNOSIS — Z7901 Long term (current) use of anticoagulants: Secondary | ICD-10-CM | POA: Diagnosis not present

## 2014-08-09 DIAGNOSIS — I480 Paroxysmal atrial fibrillation: Secondary | ICD-10-CM | POA: Diagnosis present

## 2014-08-09 DIAGNOSIS — I129 Hypertensive chronic kidney disease with stage 1 through stage 4 chronic kidney disease, or unspecified chronic kidney disease: Secondary | ICD-10-CM | POA: Diagnosis not present

## 2014-08-09 DIAGNOSIS — N189 Chronic kidney disease, unspecified: Secondary | ICD-10-CM | POA: Diagnosis not present

## 2014-08-09 DIAGNOSIS — M109 Gout, unspecified: Secondary | ICD-10-CM | POA: Insufficient documentation

## 2014-08-09 DIAGNOSIS — I498 Other specified cardiac arrhythmias: Secondary | ICD-10-CM

## 2014-08-09 DIAGNOSIS — Z85828 Personal history of other malignant neoplasm of skin: Secondary | ICD-10-CM | POA: Diagnosis not present

## 2014-08-09 DIAGNOSIS — N400 Enlarged prostate without lower urinary tract symptoms: Secondary | ICD-10-CM | POA: Diagnosis present

## 2014-08-09 DIAGNOSIS — Z85850 Personal history of malignant neoplasm of thyroid: Secondary | ICD-10-CM | POA: Diagnosis not present

## 2014-08-09 DIAGNOSIS — G459 Transient cerebral ischemic attack, unspecified: Principal | ICD-10-CM | POA: Diagnosis present

## 2014-08-09 DIAGNOSIS — Z8701 Personal history of pneumonia (recurrent): Secondary | ICD-10-CM | POA: Insufficient documentation

## 2014-08-09 HISTORY — DX: Basal cell carcinoma of skin, unspecified: C44.91

## 2014-08-09 HISTORY — DX: Unspecified osteoarthritis, unspecified site: M19.90

## 2014-08-09 HISTORY — DX: Unspecified atrial fibrillation: I48.91

## 2014-08-09 LAB — DIFFERENTIAL
BASOS PCT: 1 % (ref 0–1)
Basophils Absolute: 0 10*3/uL (ref 0.0–0.1)
EOS ABS: 0.1 10*3/uL (ref 0.0–0.7)
EOS PCT: 1 % (ref 0–5)
Lymphocytes Relative: 16 % (ref 12–46)
Lymphs Abs: 0.8 10*3/uL (ref 0.7–4.0)
Monocytes Absolute: 0.6 10*3/uL (ref 0.1–1.0)
Monocytes Relative: 11 % (ref 3–12)
NEUTROS ABS: 3.8 10*3/uL (ref 1.7–7.7)
Neutrophils Relative %: 71 % (ref 43–77)

## 2014-08-09 LAB — COMPREHENSIVE METABOLIC PANEL
ALT: 5 U/L (ref 0–53)
ANION GAP: 10 (ref 5–15)
AST: 18 U/L (ref 0–37)
Albumin: 4.1 g/dL (ref 3.5–5.2)
Alkaline Phosphatase: 57 U/L (ref 39–117)
BUN: 15 mg/dL (ref 6–23)
CHLORIDE: 98 mmol/L (ref 96–112)
CO2: 30 mmol/L (ref 19–32)
Calcium: 8.6 mg/dL (ref 8.4–10.5)
Creatinine, Ser: 1.43 mg/dL — ABNORMAL HIGH (ref 0.50–1.35)
GFR calc non Af Amer: 43 mL/min — ABNORMAL LOW (ref >90)
GFR, EST AFRICAN AMERICAN: 50 mL/min — AB (ref >90)
GLUCOSE: 92 mg/dL (ref 70–99)
Potassium: 3.9 mmol/L (ref 3.5–5.1)
Sodium: 138 mmol/L (ref 135–145)
Total Bilirubin: 1 mg/dL (ref 0.3–1.2)
Total Protein: 6.6 g/dL (ref 6.0–8.3)

## 2014-08-09 LAB — I-STAT TROPONIN, ED: TROPONIN I, POC: 0 ng/mL (ref 0.00–0.08)

## 2014-08-09 LAB — CBC
HEMATOCRIT: 47.8 % (ref 39.0–52.0)
Hemoglobin: 15.6 g/dL (ref 13.0–17.0)
MCH: 29.9 pg (ref 26.0–34.0)
MCHC: 32.6 g/dL (ref 30.0–36.0)
MCV: 91.7 fL (ref 78.0–100.0)
Platelets: 140 10*3/uL — ABNORMAL LOW (ref 150–400)
RBC: 5.21 MIL/uL (ref 4.22–5.81)
RDW: 14 % (ref 11.5–15.5)
WBC: 5.3 10*3/uL (ref 4.0–10.5)

## 2014-08-09 LAB — I-STAT CHEM 8, ED
BUN: 17 mg/dL (ref 6–23)
CHLORIDE: 97 mmol/L (ref 96–112)
CREATININE: 1.5 mg/dL — AB (ref 0.50–1.35)
Calcium, Ion: 1.1 mmol/L — ABNORMAL LOW (ref 1.13–1.30)
Glucose, Bld: 91 mg/dL (ref 70–99)
HCT: 49 % (ref 39.0–52.0)
HEMOGLOBIN: 16.7 g/dL (ref 13.0–17.0)
POTASSIUM: 3.8 mmol/L (ref 3.5–5.1)
Sodium: 140 mmol/L (ref 135–145)
TCO2: 27 mmol/L (ref 0–100)

## 2014-08-09 LAB — CBG MONITORING, ED: Glucose-Capillary: 90 mg/dL (ref 70–99)

## 2014-08-09 LAB — PROTIME-INR
INR: 1.42 (ref 0.00–1.49)
Prothrombin Time: 17.5 seconds — ABNORMAL HIGH (ref 11.6–15.2)

## 2014-08-09 LAB — APTT: aPTT: 36 seconds (ref 24–37)

## 2014-08-09 MED ORDER — HYDRALAZINE HCL 20 MG/ML IJ SOLN
10.0000 mg | Freq: Four times a day (QID) | INTRAMUSCULAR | Status: DC | PRN
  Administered 2014-08-09: 10 mg via INTRAVENOUS
  Filled 2014-08-09: qty 1

## 2014-08-09 MED ORDER — DILTIAZEM HCL 30 MG PO TABS: 60.0000 mg | ORAL_TABLET | Freq: Two times a day (BID) | ORAL | Status: DC | PRN

## 2014-08-09 MED ORDER — ADULT MULTIVITAMIN W/MINERALS CH
1.0000 | ORAL_TABLET | Freq: Every day | ORAL | Status: DC
  Administered 2014-08-10: 1 via ORAL
  Filled 2014-08-09: qty 1

## 2014-08-09 MED ORDER — CARBIDOPA-LEVODOPA 25-100 MG PO TABS
1.0000 | ORAL_TABLET | Freq: Four times a day (QID) | ORAL | Status: DC
  Administered 2014-08-09 – 2014-08-10 (×4): 1 via ORAL
  Filled 2014-08-09 (×4): qty 1

## 2014-08-09 MED ORDER — RISAQUAD PO CAPS
1.0000 | ORAL_CAPSULE | Freq: Every day | ORAL | Status: DC
  Administered 2014-08-10: 1 via ORAL
  Filled 2014-08-09: qty 1

## 2014-08-09 MED ORDER — ALIGN 4 MG PO CAPS
1.0000 | ORAL_CAPSULE | Freq: Every day | ORAL | Status: DC
Start: 2014-08-09 — End: 2014-08-09

## 2014-08-09 MED ORDER — RIVAROXABAN 15 MG PO TABS
15.0000 mg | ORAL_TABLET | Freq: Every day | ORAL | Status: DC
  Administered 2014-08-09: 15 mg via ORAL
  Filled 2014-08-09: qty 1

## 2014-08-09 MED ORDER — LOSARTAN POTASSIUM 50 MG PO TABS
25.0000 mg | ORAL_TABLET | Freq: Every day | ORAL | Status: DC
  Administered 2014-08-09 – 2014-08-10 (×2): 25 mg via ORAL
  Filled 2014-08-09 (×2): qty 1

## 2014-08-09 MED ORDER — RASAGILINE MESYLATE 1 MG PO TABS
1.0000 mg | ORAL_TABLET | Freq: Every day | ORAL | Status: DC
  Administered 2014-08-09 – 2014-08-10 (×2): 1 mg via ORAL
  Filled 2014-08-09 (×2): qty 1

## 2014-08-09 MED ORDER — LEVOTHYROXINE SODIUM 100 MCG PO TABS
100.0000 ug | ORAL_TABLET | Freq: Every day | ORAL | Status: DC
  Administered 2014-08-10: 100 ug via ORAL
  Filled 2014-08-09: qty 1

## 2014-08-09 MED ORDER — TAMSULOSIN HCL 0.4 MG PO CAPS
0.4000 mg | ORAL_CAPSULE | Freq: Every day | ORAL | Status: DC
Start: 2014-08-09 — End: 2014-08-10
  Administered 2014-08-10: 0.4 mg via ORAL
  Filled 2014-08-09: qty 1

## 2014-08-09 MED ORDER — STROKE: EARLY STAGES OF RECOVERY BOOK
Freq: Once | Status: DC
  Filled 2014-08-09: qty 1

## 2014-08-09 MED ORDER — ACETAMINOPHEN 325 MG PO TABS: 650.0000 mg | ORAL_TABLET | ORAL | Status: DC | PRN

## 2014-08-09 MED ORDER — ATORVASTATIN CALCIUM 10 MG PO TABS
20.0000 mg | ORAL_TABLET | Freq: Every day | ORAL | Status: DC
  Administered 2014-08-09: 20 mg via ORAL
  Filled 2014-08-09 (×2): qty 2

## 2014-08-09 NOTE — Progress Notes (Signed)
EKG repeated and reviewed. Shows sinus bradycardia at 51 with LAD. No ST-T changes/. Continue to  hold Cardizem and monitor on tele.

## 2014-08-09 NOTE — Consult Note (Signed)
Admission H&P    Chief Complaint: Transient speech output difficulty and left facial droop.  HPI: William [name] is an 81 y.o. male with recently diagnosed atrial fibrillation on Xaralto, hypertension, hyperlipidemia and [name], presenting to the waiting an episode of difficulty with difficulty forming words, in addition to a shoulder. Symptoms lasted 5-10 minutes. CT scan of his head showed no acute intracranial abnormality. NIH stroke score at the time of this evaluation was 0.  LSN: 2 PM on 08/09/2014 tPA Given: No: Deficits resolved; on Xaralto for atrial fibrillation mRankin:  Past Medical History  Diagnosis Date  . Hyperlipidemia   . Hx of colonic polyps   . Benign prostatic hypertrophy with urinary obstruction   . TIA (transient ischemic attack)   . Subdural hematoma     2003  . DJD (degenerative joint disease)   . Gout   . Malignant neoplasm of thyroid gland   . Primary skin squamous cell carcinoma   . Complication of anesthesia     "difficulty intubation, had to use fiberoptic 2006"  . Hypertension     sees [name]  . Thyroid disease     medullary thyroid   . Pneumonia     hx of in 1952  . Kidney tumor   . Parkinson disease   . A-fib     Past Surgical History  Procedure Laterality Date  . Cataract sugery    . Cholecystectomy    . Right inguinal hernia repair    . Thyroidectomy for medullary carcinoma    . Eye surgery    . Knee arthroscopy    . Appendectomy    . Tonsillectomy    . Lumbar laminectomy/decompression microdiscectomy  05/14/2012    Procedure: LUMBAR LAMINECTOMY/DECOMPRESSION MICRODISCECTOMY 1 LEVEL;  Surgeon: [name], MD;  Location: [facility];  Service: Neurosurgery;  Laterality: Left;  Left Lumbar four-five Laminectomy/Diskectomy/Far lateral diskectomy  . Kidney surgery  07-10-12  . Skin cancer removed  11/2013    Family History  Problem Relation Age of Onset  . Stroke Mother   . Alzheimer's disease Brother   .  Stroke Father   . Alzheimer's disease Sister    Social History:  reports that he quit smoking about 49 years ago. He has never used smokeless tobacco. He reports that he drinks alcohol. He reports that he does not use illicit drugs.  Allergies: No Known Allergies  Medications Prior to Admission  Medication Sig Dispense Refill  . atorvastatin (LIPITOR) 20 MG tablet Take 1 tablet (20 mg total) by mouth daily. 30 tablet 11  . carbidopa-levodopa (SINEMET IR) 25-100 MG per tablet Take 1 tablet by mouth 4 (four) times daily. At 8, 12, 5 PM, and 9 or 10 PM 360 tablet 3  . diltiazem (CARDIZEM) 60 MG tablet Take 1 tablet (60 mg total) by mouth 2 (two) times daily as needed (palpitations). 60 tablet 0  . levothyroxine (SYNTHROID) 100 MCG tablet Take 1 tablet (100 mcg total) by mouth daily before breakfast. 90 tablet 2  . losartan (COZAAR) 25 MG tablet Take 1 tablet (25 mg total) by mouth daily. 30 tablet 0  . Probiotic Product (ALIGN) 4 MG CAPS Take 1 capsule by mouth daily.    . rasagiline (AZILECT) 1 MG TABS tablet Take 1 tablet (1 mg total) by mouth daily. 90 tablet 3  . Rivaroxaban (XARELTO) 15 MG TABS tablet Take 1 tablet (15 mg total) by mouth daily with supper. 30 tablet 11  .  tamsulosin (FLOMAX) 0.4 MG CAPS capsule Take 1 capsule (0.4 mg total) by mouth daily. 30 capsule 0  . UNABLE TO FIND Triple antibiotic ointment    . Acetaminophen 500 MG coapsule     . Multiple Vitamin (MULTIVITAMIN WITH MINERALS) TABS Take 1 tablet by mouth daily.      ROS: History obtained from the patient  General ROS: negative for - chills, fatigue, fever, night sweats, weight gain or weight loss Psychological ROS: negative for - behavioral disorder, hallucinations, memory difficulties, mood swings or suicidal ideation Ophthalmic ROS: negative for - blurry vision, double vision, eye pain or loss of vision ENT ROS: negative for - epistaxis, nasal discharge, oral lesions, sore throat, tinnitus or vertigo Allergy  and Immunology ROS: negative for - hives or itchy/watery eyes Hematological and Lymphatic ROS: negative for - bleeding problems, bruising or swollen lymph nodes Endocrine ROS: negative for - galactorrhea, hair pattern changes, polydipsia/polyuria or temperature intolerance Respiratory ROS: negative for - cough, hemoptysis, shortness of breath or wheezing Cardiovascular ROS: negative for - chest pain, dyspnea on exertion, edema or irregular heartbeat Gastrointestinal ROS: negative for - abdominal pain, diarrhea, hematemesis, nausea/vomiting or stool incontinence Genito-Urinary ROS: negative for - dysuria, hematuria, incontinence or urinary frequency/urgency Musculoskeletal ROS: negative for - joint swelling or muscular weakness Neurological ROS: as noted in HPI Dermatological ROS: negative for rash and skin lesion changes  Physical Examination: Blood pressure 198/102, pulse 51, temperature 97.7 F (36.5 C), temperature source Axillary, resp. rate 17, SpO2 99 %.  HEENT-  Normocephalic, no lesions, without obvious abnormality.  Normal external eye and conjunctiva.  Normal TM's bilaterally.  Normal auditory canals and external ears. Normal external nose, mucus membranes and septum.  Normal pharynx. Neck supple with no masses, nodes, nodules or enlargement. Cardiovascular - regular rate and rhythm, S1, S2 normal, no murmur, click, rub or gallop Lungs - chest clear, no wheezing, rales, normal symmetric air entry Abdomen - soft, non-tender; bowel sounds normal; no masses,  no organomegaly Extremities - no joint deformities, effusion, or inflammation and no edema  Neurologic Examination: Mental Status: Alert, oriented, thought content appropriate.  Speech fluent without evidence of aphasia. Able to follow commands without difficulty. Cranial Nerves: II-Visual fields were normal. III/IV/VI-Pupils were equal and reacted normally to light. Extraocular movements were full and conjugate.    V/VII-no  facial numbness and no facial weakness. VIII-normal. X-normal speech and symmetrical palatal movement. XI: trapezius strength/neck flexion strength normal bilaterally XII-midline tongue extension with normal strength. Motor: 5/5 bilaterally with normal tone and bulk Sensory: Normal throughout. Deep Tendon Reflexes: 1+ and symmetric. Plantars: Mute bilaterally Cerebellar: Normal finger-to-nose testing. Carotid auscultation: Normal  Results for orders placed or performed during the hospital encounter of 08/09/14 (from the past 48 hour(s))  CBG monitoring, ED     Status: None   Collection Time: 08/09/14  3:03 PM  Result Value Ref Range   Glucose-Capillary 90 70 - 99 mg/dL  Protime-INR     Status: Abnormal   Collection Time: 08/09/14  3:30 PM  Result Value Ref Range   Prothrombin Time 17.5 (H) 11.6 - 15.2 seconds   INR 1.42 0.00 - 1.49  APTT     Status: None   Collection Time: 08/09/14  3:30 PM  Result Value Ref Range   aPTT 36 24 - 37 seconds  CBC     Status: Abnormal   Collection Time: 08/09/14  3:30 PM  Result Value Ref Range   WBC 5.3 4.0 - 10.5 K/uL  RBC 5.21 4.22 - 5.81 MIL/uL   Hemoglobin 15.6 13.0 - 17.0 g/dL   HCT 47.8 39.0 - 52.0 %   MCV 91.7 78.0 - 100.0 fL   MCH 29.9 26.0 - 34.0 pg   MCHC 32.6 30.0 - 36.0 g/dL   RDW 14.0 11.5 - 15.5 %   Platelets 140 (L) 150 - 400 K/uL  Differential     Status: None   Collection Time: 08/09/14  3:30 PM  Result Value Ref Range   Neutrophils Relative % 71 43 - 77 %   Neutro Abs 3.8 1.7 - 7.7 K/uL   Lymphocytes Relative 16 12 - 46 %   Lymphs Abs 0.8 0.7 - 4.0 K/uL   Monocytes Relative 11 3 - 12 %   Monocytes Absolute 0.6 0.1 - 1.0 K/uL   Eosinophils Relative 1 0 - 5 %   Eosinophils Absolute 0.1 0.0 - 0.7 K/uL   Basophils Relative 1 0 - 1 %   Basophils Absolute 0.0 0.0 - 0.1 K/uL  Comprehensive metabolic panel     Status: Abnormal   Collection Time: 08/09/14  3:30 PM  Result Value Ref Range   Sodium 138 135 - 145 mmol/L    Potassium 3.9 3.5 - 5.1 mmol/L   Chloride 98 96 - 112 mmol/L   CO2 30 19 - 32 mmol/L   Glucose, Bld 92 70 - 99 mg/dL   BUN 15 6 - 23 mg/dL   Creatinine, Ser 1.43 (H) 0.50 - 1.35 mg/dL   Calcium 8.6 8.4 - 10.5 mg/dL   Total Protein 6.6 6.0 - 8.3 g/dL   Albumin 4.1 3.5 - 5.2 g/dL   AST 18 0 - 37 U/L   ALT 5 0 - 53 U/L   Alkaline Phosphatase 57 39 - 117 U/L   Total Bilirubin 1.0 0.3 - 1.2 mg/dL   GFR calc non Af Amer 43 (L) >90 mL/min   GFR calc Af Amer 50 (L) >90 mL/min    Comment: (NOTE) The eGFR has been calculated using the CKD EPI equation. This calculation has not been validated in all clinical situations. eGFR's persistently <90 mL/min signify possible Chronic Kidney Disease.    Anion gap 10 5 - 15  I-stat troponin, ED (not at [facility], [facility])     Status: None   Collection Time: 08/09/14  3:34 PM  Result Value Ref Range   Troponin i, poc 0.00 0.00 - 0.08 ng/mL   Comment 3            Comment: Due to the release kinetics of cTnI, a negative result within the first hours of the onset of symptoms does not rule out myocardial infarction with certainty. If myocardial infarction is still suspected, repeat the test at appropriate intervals.   I-Stat Chem 8, ED     Status: Abnormal   Collection Time: 08/09/14  3:36 PM  Result Value Ref Range   Sodium 140 135 - 145 mmol/L   Potassium 3.8 3.5 - 5.1 mmol/L   Chloride 97 96 - 112 mmol/L   BUN 17 6 - 23 mg/dL   Creatinine, Ser 1.50 (H) 0.50 - 1.35 mg/dL   Glucose, Bld 91 70 - 99 mg/dL   Calcium, Ion 1.10 (L) 1.13 - 1.30 mmol/L   TCO2 27 0 - 100 mmol/L   Hemoglobin 16.7 13.0 - 17.0 g/dL   HCT 49.0 39.0 - 52.0 %   Ct Head (brain) Wo Contrast  08/09/2014   CLINICAL DATA:  Transient dysarthria  EXAM: CT HEAD WITHOUT CONTRAST  TECHNIQUE: Contiguous axial images were obtained from the base of the skull through the vertex without intravenous contrast.  COMPARISON:  Brain MRI October 17, 2009  FINDINGS: There is mild diffuse atrophy. There is no  intracranial mass, hemorrhage, extra-axial fluid collection, or midline shift. There is mild patchy small vessel disease in the centra semiovale bilaterally. Elsewhere gray-white compartments appear normal. No acute infarct is apparent. There is evidence of a previous left frontal-parietal craniotomy with metallic fixation in the area of the craniotomy. Bony calvarium otherwise appears intact. The mastoid air cells are clear. There is a small medial left occipital scalp hematoma.  IMPRESSION: Mild atrophy with patchy periventricular small vessel disease, stable. Postoperative craniotomy left frontal-parietal region. No intracranial mass, hemorrhage, or acute appearing infarct. Small left occipital scalp hematoma. Question recent trauma in this area.   Electronically Signed   By: Lowella Grip III M.D.   On: 08/09/2014 16:08    Assessment: 81 y.o. male with multiple risk factors for stroke is entering with probable multivessel subcortical TIA. Acute small renal infarction cannot be ruled out, however.  Stroke Risk Factors - atrial fibrillation, family history, hyperlipidemia and hypertension  Plan: 1. HgbA1c, fasting lipid panel 2. MRI, MRA  of the brain without contrast 3. PT consult, OT consult, Speech consult 4. Echocardiogram 5. Carotid dopplers 6. Prophylactic therapy-Anticoagulation: Xaralto 7. Risk factor modification 8. Telemetry monitoring  C.R. [name], MD [facility] [phone]  08/09/2014, 6:15 PM

## 2014-08-09 NOTE — ED Provider Notes (Signed)
CSN: [id number]     Arrival date & time 08/09/14  1446 History   First MD Initiated Contact with Patient 08/09/14 1506     Chief Complaint  Patient presents with  . Transient Ischemic Attack     (Consider location/radiation/quality/duration/timing/severity/associated sxs/prior Treatment) The history is provided by the patient, the spouse and the EMS personnel.   brought in by EMS for her stroke symptoms however they resolved prior to arrival so more consistent with TIA. Patient's spouse noted that he had difficulty speaking this occurred around 2:00. Patient was noted by EMS to have left sided facial droop and expressive aphasia. Everything resolved by arrival. Patient diagnosed with onset of atrial fibrillation 2 weeks ago started on Xarelto and cardiac exam. Ventricular heart rate by EMS was 40-50. Patient's blood sugar at the scene was 108.  Past Medical History  Diagnosis Date  . Hyperlipidemia   . Hx of colonic polyps   . Benign prostatic hypertrophy with urinary obstruction   . TIA (transient ischemic attack)   . Subdural hematoma     2003  . DJD (degenerative joint disease)   . Gout   . Malignant neoplasm of thyroid gland   . Primary skin squamous cell carcinoma   . Complication of anesthesia     "difficulty intubation, had to use fiberoptic 2006"  . Hypertension     sees [name]  . Thyroid disease     medullary thyroid   . Pneumonia     hx of in 1952  . Kidney tumor   . Parkinson disease   . A-fib    Past Surgical History  Procedure Laterality Date  . Cataract sugery    . Cholecystectomy    . Right inguinal hernia repair    . Thyroidectomy for medullary carcinoma    . Eye surgery    . Knee arthroscopy    . Appendectomy    . Tonsillectomy    . Lumbar laminectomy/decompression microdiscectomy  05/14/2012    Procedure: LUMBAR LAMINECTOMY/DECOMPRESSION MICRODISCECTOMY 1 LEVEL;  Surgeon: [name], MD;  Location: [facility];  Service: Neurosurgery;   Laterality: Left;  Left Lumbar four-five Laminectomy/Diskectomy/Far lateral diskectomy  . Kidney surgery  07-10-12  . Skin cancer removed  11/2013   Family History  Problem Relation Age of Onset  . Stroke Mother   . Alzheimer's disease Brother   . Stroke Father   . Alzheimer's disease Sister    History  Substance Use Topics  . Smoking status: Former Smoker -- 0.80 packs/day for 15 years    Quit date: 04/15/1965  . Smokeless tobacco: Never Used  . Alcohol Use: 0.0 oz/week    0 Standard drinks or equivalent per week     Comment: 1 glass of wine every night    Review of Systems  Constitutional: Negative for fever.  HENT: Negative for congestion.   Eyes: Negative for visual disturbance.  Respiratory: Negative for shortness of breath.   Cardiovascular: Negative for chest pain and palpitations.  Gastrointestinal: Negative for nausea, vomiting and abdominal pain.  Genitourinary: Negative for dysuria.  Musculoskeletal: Negative for back pain.  Skin: Negative for rash.  Neurological: Positive for speech difficulty and weakness. Negative for headaches.  Hematological: Does not bruise/bleed easily.  Psychiatric/Behavioral: Negative for confusion.      Allergies  Review of patient's allergies indicates no known allergies.  Home Medications   Prior to Admission medications   Medication Sig Start Date End Date Taking? Authorizing Provider  atorvastatin (  LIPITOR) 20 MG tablet Take 1 tablet (20 mg total) by mouth daily. 07/18/14 07/18/15 Yes Florencia Reasons, MD  carbidopa-levodopa (SINEMET IR) 25-100 MG per tablet Take 1 tablet by mouth 4 (four) times daily. At 8, 12, 5 PM, and 9 or 10 PM 05/04/14  Yes Star Age, MD  diltiazem (CARDIZEM) 60 MG tablet Take 1 tablet (60 mg total) by mouth 2 (two) times daily as needed (palpitations). 07/18/14  Yes Florencia Reasons, MD  levothyroxine (SYNTHROID) 100 MCG tablet Take 1 tablet (100 mcg total) by mouth daily before breakfast. 08/08/14 08/08/15 Yes [name], MD   losartan (COZAAR) 25 MG tablet Take 1 tablet (25 mg total) by mouth daily. 07/18/14  Yes Florencia Reasons, MD  Probiotic Product (ALIGN) 4 MG CAPS Take 1 capsule by mouth daily.   Yes Historical Provider, MD  rasagiline (AZILECT) 1 MG TABS tablet Take 1 tablet (1 mg total) by mouth daily. 05/04/14  Yes Star Age, MD  Rivaroxaban (XARELTO) 15 MG TABS tablet Take 1 tablet (15 mg total) by mouth daily with supper. 07/18/14  Yes Florencia Reasons, MD  tamsulosin (FLOMAX) 0.4 MG CAPS capsule Take 1 capsule (0.4 mg total) by mouth daily. 07/18/14  Yes Florencia Reasons, MD  UNABLE TO FIND Triple antibiotic ointment 02/14/14  Yes Historical Provider, MD  Acetaminophen 500 MG coapsule  02/14/14   Historical Provider, MD  Multiple Vitamin (MULTIVITAMIN WITH MINERALS) TABS Take 1 tablet by mouth daily.    Historical Provider, MD   BP 174/81 mmHg  Pulse 33  Temp(Src) 98.6 F (37 C) (Oral)  Resp 12  SpO2 98% Physical Exam  Constitutional: He is oriented to person, place, and time. He appears well-developed and well-nourished. No distress.  HENT:  Head: Normocephalic and atraumatic.  Mouth/Throat: Oropharynx is clear and moist.  Eyes: Conjunctivae and EOM are normal. Pupils are equal, round, and reactive to light.  Neck: Normal range of motion.  Cardiovascular: Normal heart sounds.   Irregular bradycardic  Pulmonary/Chest: Effort normal and breath sounds normal. No respiratory distress.  Abdominal: Soft. Bowel sounds are normal. There is no tenderness.  Musculoskeletal: Normal range of motion.  Neurological: He is alert and oriented to person, place, and time. No cranial nerve deficit. He exhibits normal muscle tone. Coordination normal.  Skin: Skin is warm. No rash noted.  Nursing note and vitals reviewed.   ED Course  Procedures (including critical care time) Labs Review Labs Reviewed  PROTIME-INR - Abnormal; Notable for the following:    Prothrombin Time 17.5 (*)    All other components within normal limits  CBC -  Abnormal; Notable for the following:    Platelets 140 (*)    All other components within normal limits  I-STAT CHEM 8, ED - Abnormal; Notable for the following:    Creatinine, Ser 1.50 (*)    Calcium, Ion 1.10 (*)    All other components within normal limits  APTT  DIFFERENTIAL  COMPREHENSIVE METABOLIC PANEL  CBG MONITORING, ED  I-STAT TROPOININ, ED   Results for orders placed or performed during the hospital encounter of 08/09/14  Protime-INR  Result Value Ref Range   Prothrombin Time 17.5 (H) 11.6 - 15.2 seconds   INR 1.42 0.00 - 1.49  APTT  Result Value Ref Range   aPTT 36 24 - 37 seconds  CBC  Result Value Ref Range   WBC 5.3 4.0 - 10.5 K/uL   RBC 5.21 4.22 - 5.81 MIL/uL   Hemoglobin 15.6 13.0 - 17.0  g/dL   HCT 47.8 39.0 - 52.0 %   MCV 91.7 78.0 - 100.0 fL   MCH 29.9 26.0 - 34.0 pg   MCHC 32.6 30.0 - 36.0 g/dL   RDW 14.0 11.5 - 15.5 %   Platelets 140 (L) 150 - 400 K/uL  Differential  Result Value Ref Range   Neutrophils Relative % 71 43 - 77 %   Neutro Abs 3.8 1.7 - 7.7 K/uL   Lymphocytes Relative 16 12 - 46 %   Lymphs Abs 0.8 0.7 - 4.0 K/uL   Monocytes Relative 11 3 - 12 %   Monocytes Absolute 0.6 0.1 - 1.0 K/uL   Eosinophils Relative 1 0 - 5 %   Eosinophils Absolute 0.1 0.0 - 0.7 K/uL   Basophils Relative 1 0 - 1 %   Basophils Absolute 0.0 0.0 - 0.1 K/uL  CBG monitoring, ED  Result Value Ref Range   Glucose-Capillary 90 70 - 99 mg/dL  I-Stat Chem 8, ED  Result Value Ref Range   Sodium 140 135 - 145 mmol/L   Potassium 3.8 3.5 - 5.1 mmol/L   Chloride 97 96 - 112 mmol/L   BUN 17 6 - 23 mg/dL   Creatinine, Ser 1.50 (H) 0.50 - 1.35 mg/dL   Glucose, Bld 91 70 - 99 mg/dL   Calcium, Ion 1.10 (L) 1.13 - 1.30 mmol/L   TCO2 27 0 - 100 mmol/L   Hemoglobin 16.7 13.0 - 17.0 g/dL   HCT 49.0 39.0 - 52.0 %  I-stat troponin, ED (not at [facility])  Result Value Ref Range   Troponin i, poc 0.00 0.00 - 0.08 ng/mL   Comment 3             Imaging Review Ct Head  (brain) Wo Contrast  08/09/2014   CLINICAL DATA:  Transient dysarthria  EXAM: CT HEAD WITHOUT CONTRAST  TECHNIQUE: Contiguous axial images were obtained from the base of the skull through the vertex without intravenous contrast.  COMPARISON:  Brain MRI October 17, 2009  FINDINGS: There is mild diffuse atrophy. There is no intracranial mass, hemorrhage, extra-axial fluid collection, or midline shift. There is mild patchy small vessel disease in the centra semiovale bilaterally. Elsewhere gray-white compartments appear normal. No acute infarct is apparent. There is evidence of a previous left frontal-parietal craniotomy with metallic fixation in the area of the craniotomy. Bony calvarium otherwise appears intact. The mastoid air cells are clear. There is a small medial left occipital scalp hematoma.  IMPRESSION: Mild atrophy with patchy periventricular small vessel disease, stable. Postoperative craniotomy left frontal-parietal region. No intracranial mass, hemorrhage, or acute appearing infarct. Small left occipital scalp hematoma. Question recent trauma in this area.   Electronically Signed   By: Lowella Grip III M.D.   On: 08/09/2014 16:08     EKG Interpretation   Date/Time:  Tuesday August 09 2014 14:49:34 EDT Ventricular Rate:  39 PR Interval:    QRS Duration: 173 QT Interval:  524 QTC Calculation: 422 R Axis:   -21 Text Interpretation:  Junctional rhythm Right bundle branch block  Confirmed by Jonanthan Bolender  MD, Ariyon Gerstenberger ([id number]) on 08/09/2014 2:52:51 PM      MDM   Final diagnoses:  Transient cerebral ischemia, unspecified transient cerebral ischemia type    The patient with TIA brought in by EMS, events occurred around 2:00 this afternoon, symptoms resolved before arrival. Episode lasted about 510 minutes. Patient according to EMS had left-sided facial droop and expressive aphasia. Patient was diagnosed  with atrial fib 2 weeks ago and started on some roll to and is also on cardiac exam. This  could play significant role in the TIA.  Head CT negative patient's neuro symptoms are all negative and have resolved. Consult into neural hospitalist as well as [facility] for admission.    Fredia Sorrow, MD 08/09/14 [phone]

## 2014-08-09 NOTE — H&P (Signed)
[facility] History and Physical  William [name]:[id number] DOB: June 27, 1928 DOA: 08/09/2014  Referring physician: Dr. Bobby Rumpf PCP: [name], MD   Chief Complaint: Slurred speech since one day   HPI:  81 year old male with history of Parkinson disease, hypertension, hyperlipidemia, recent A. fib started on Xarelto who presented to the ED with acute onset of slurred speech and some confusion. She was working on his computer at home this morning when his wife ostium what he wanted to have lunch. His wife noticed him to have slurred speech. Patient reports feeling confused. She then called 911. By the time they arrived the posterior speech had resolved. Fingersticks checked by EMS was 108. Vitals were stable.  Patient denies weakness or numbness. Wife denies noticing any facial droop. No loss of consciousness. headache, dizziness, fever, chills, nausea , vomiting, chest pain, palpitations, SOB, abdominal pain, bowel or urinary symptoms. Denies change in weight or appetite. Denies any recent trauma to the head.  Course in the ED Patient was hypertensive with systolic blood pressure of 198/102 mmHg and bradycardic  in the 30s Blood work done showed normal WBC, hemoglobin. Platelets of 140. Chemistry showed creatinine of 1.43 (at baseline). Head CT was negative for hemorrhage or acute infarct. (Showed small left occipital scalp hematoma). Hospitalist admission requested for TIA workup.  Review of Systems:  Constitutional: Denies fever, chills, diaphoresis, appetite change and fatigue.  HEENT: Denies sore hearing symptoms, congestion, sore throat, neck pain or stiffness  Respiratory: Denies SOB, DOE, cough, chest tightness,  and wheezing.   Cardiovascular: Denies chest pain, palpitations and leg swelling.  Gastrointestinal: Denies nausea, vomiting, abdominal pain, diarrhea, constipation, blood in stool and abdominal distention.  Genitourinary: Denies dysuria, , hematuria, flank  pain and difficulty urinating.  Endocrine: Denies: hot or cold intolerance,  polyuria, polydipsia. Musculoskeletal: Denies myalgias, back pain, joint pain or swelling Skin: Denies rash and wound.  Neurological: Slurred speech, confusion, Denies dizziness, seizures, syncope, weakness, light-headedness, numbness and headaches.  Hematological: Denies adenopathy.  Psychiatric/Behavioral: confusion+  Past Medical History  Diagnosis Date  . Hyperlipidemia   . Hx of colonic polyps   . Benign prostatic hypertrophy with urinary obstruction   . TIA (transient ischemic attack)   . Subdural hematoma     2003  . DJD (degenerative joint disease)   . Gout   . Malignant neoplasm of thyroid gland   . Primary skin squamous cell carcinoma   . Complication of anesthesia     "difficulty intubation, had to use fiberoptic 2006"  . Hypertension     sees [name]  . Thyroid disease     medullary thyroid   . Pneumonia     hx of in 1952  . Kidney tumor   . Parkinson disease   . A-fib    Past Surgical History  Procedure Laterality Date  . Cataract sugery    . Cholecystectomy    . Right inguinal hernia repair    . Thyroidectomy for medullary carcinoma    . Eye surgery    . Knee arthroscopy    . Appendectomy    . Tonsillectomy    . Lumbar laminectomy/decompression microdiscectomy  05/14/2012    Procedure: LUMBAR LAMINECTOMY/DECOMPRESSION MICRODISCECTOMY 1 LEVEL;  Surgeon: [name], MD;  Location: [place] [facility];  Service: Neurosurgery;  Laterality: Left;  Left Lumbar four-five Laminectomy/Diskectomy/Far lateral diskectomy  . Kidney surgery  07-10-12  . Skin cancer removed  11/2013   Social History:  reports that he quit smoking about  49 years ago. He has never used smokeless tobacco. He reports that he drinks alcohol. He reports that he does not use illicit drugs.  No Known Allergies  Family History  Problem Relation Age of Onset  . Stroke Mother   . Alzheimer's disease Brother   .  Stroke Father   . Alzheimer's disease Sister     Prior to Admission medications   Medication Sig Start Date End Date Taking? Authorizing Provider  atorvastatin (LIPITOR) 20 MG tablet Take 1 tablet (20 mg total) by mouth daily. 07/18/14 07/18/15 Yes Florencia Reasons, MD  carbidopa-levodopa (SINEMET IR) 25-100 MG per tablet Take 1 tablet by mouth 4 (four) times daily. At 8, 12, 5 PM, and 9 or 10 PM 05/04/14  Yes Star Age, MD  diltiazem (CARDIZEM) 60 MG tablet Take 1 tablet (60 mg total) by mouth 2 (two) times daily as needed (palpitations). 07/18/14  Yes Florencia Reasons, MD  levothyroxine (SYNTHROID) 100 MCG tablet Take 1 tablet (100 mcg total) by mouth daily before breakfast. 08/08/14 08/08/15 Yes [name], MD  losartan (COZAAR) 25 MG tablet Take 1 tablet (25 mg total) by mouth daily. 07/18/14  Yes Florencia Reasons, MD  Probiotic Product (ALIGN) 4 MG CAPS Take 1 capsule by mouth daily.   Yes Historical Provider, MD  rasagiline (AZILECT) 1 MG TABS tablet Take 1 tablet (1 mg total) by mouth daily. 05/04/14  Yes Star Age, MD  Rivaroxaban (XARELTO) 15 MG TABS tablet Take 1 tablet (15 mg total) by mouth daily with supper. 07/18/14  Yes Florencia Reasons, MD  tamsulosin (FLOMAX) 0.4 MG CAPS capsule Take 1 capsule (0.4 mg total) by mouth daily. 07/18/14  Yes Florencia Reasons, MD  UNABLE TO FIND Triple antibiotic ointment 02/14/14  Yes Historical Provider, MD  Acetaminophen 500 MG coapsule  02/14/14   Historical Provider, MD  Multiple Vitamin (MULTIVITAMIN WITH MINERALS) TABS Take 1 tablet by mouth daily.    Historical Provider, MD     Physical Exam:  Filed Vitals:   08/09/14 1630 08/09/14 1645 08/09/14 1649 08/09/14 1715  BP: 181/104 187/88 186/86 198/102  Pulse: 47 48 48 51  Temp:    97.7 F (36.5 C)  TempSrc:    Axillary  Resp: 16 15 22 17   SpO2: 99% 98% 97% 99%    Constitutional: Vital signs reviewed.  Patient is a well-developed and well-nourished in no acute distress. HEENT: no pallor, no icterus, moist oral mucosa, no cervical  lymphadenopathy Cardiovascular: RRR, S1 normal, S2 normal, no MRG Chest: CTAB, no wheezes, rales, or rhonchi Abdominal: Soft. Non-tender, non-distended, bowel sounds are normal,  Ext: warm, no edema Neurological: A&O x3, and lungs 2-12 intact, normal motor power tone and reflex, fine resting tremors of right hand and lips ( baseline with Parkinson's), cerebellar function intact.  Labs on Admission:  Basic Metabolic Panel:  Recent Labs Lab 08/09/14 1530 08/09/14 1536  NA 138 140  K 3.9 3.8  CL 98 97  CO2 30  --   GLUCOSE 92 91  BUN 15 17  CREATININE 1.43* 1.50*  CALCIUM 8.6  --    Liver Function Tests:  Recent Labs Lab 08/09/14 1530  AST 18  ALT 5  ALKPHOS 57  BILITOT 1.0  PROT 6.6  ALBUMIN 4.1   No results for input(s): LIPASE, AMYLASE in the last 168 hours. No results for input(s): AMMONIA in the last 168 hours. CBC:  Recent Labs Lab 08/09/14 1530 08/09/14 1536  WBC 5.3  --   NEUTROABS  3.8  --   HGB 15.6 16.7  HCT 47.8 49.0  MCV 91.7  --   PLT 140*  --    Cardiac Enzymes: No results for input(s): CKTOTAL, CKMB, CKMBINDEX, TROPONINI in the last 168 hours. BNP: Invalid input(s): POCBNP CBG:  Recent Labs Lab 08/09/14 1503  GLUCAP 90    Radiological Exams on Admission: Ct Head (brain) Wo Contrast  08/09/2014   CLINICAL DATA:  Transient dysarthria  EXAM: CT HEAD WITHOUT CONTRAST  TECHNIQUE: Contiguous axial images were obtained from the base of the skull through the vertex without intravenous contrast.  COMPARISON:  Brain MRI October 17, 2009  FINDINGS: There is mild diffuse atrophy. There is no intracranial mass, hemorrhage, extra-axial fluid collection, or midline shift. There is mild patchy small vessel disease in the centra semiovale bilaterally. Elsewhere gray-white compartments appear normal. No acute infarct is apparent. There is evidence of a previous left frontal-parietal craniotomy with metallic fixation in the area of the craniotomy. Bony calvarium  otherwise appears intact. The mastoid air cells are clear. There is a small medial left occipital scalp hematoma.  IMPRESSION: Mild atrophy with patchy periventricular small vessel disease, stable. Postoperative craniotomy left frontal-parietal region. No intracranial mass, hemorrhage, or acute appearing infarct. Small left occipital scalp hematoma. Question recent trauma in this area.   Electronically Signed   By: Lowella Grip III M.D.   On: 08/09/2014 16:08    EKG: junctional rhythm @39 , No ST-T changes  Assessment/Plan  Principal Problem:   TIA (transient ischemic attack) Admit to telemetry  neuro checks q2  Hr for 12 hrs then q 4hrs Head CT on admission negative for acute event. Ordered MRI brain, MRA head. -Check  carotid doppler, A1C and lipid panel. recent 2D echo with normal EF and no wall motion abnormality. Will der repeating to neurology -cleared bedside swallow eval, PT, OT. -continue  statin. Allow permissive HTN. On xarelto -follow up with neurology  Uncontrolled hypertension Allow permissive blood pressure. Hold Cardizem given junctional rhythm. Check repeat EKG.  Resume losartan. Order when necessary hydralazine.  Bradycardia with junctional rhythm Hold Cardizem. Monitor closely on telemetry. If bradycardia persists please call cardiology.  [name] Follows with outpatient neurology. Continue Sinemet  Chronic in the disease stage III Stable. Continue to monitor  Paroxysmal A. fib Recently started on anticoagulation. Recent nuclear stress test was normal.    Diet:cardiac  DVT prophylaxis: On xarelto   Code Status: full code Family Communication: discussed with wife and son at bedside Disposition Plan: stroke w./up . observe for 2 4hrs. D/c home  Louellen Molder [facility] Pager [phone]  Total time spent on admission :60 minutes  If 7PM-7AM, please contact night-coverage [email or web address] Password [facility] 08/09/2014, 5:42 PM

## 2014-08-09 NOTE — ED Notes (Signed)
Per [facility], pt from home for left sided facial droop and expressive aphasia that have resolved at this time. Dx with new onset afib 2 weeks ago and started on xarelto. Atrial rate 150-300 on monitor and vent rate 40-50. Denies any cp but did have some fluttering earlier before the issue started. CBG 108. 20g to LAC. Episode lasted about 5-10 minutes and resolved.

## 2014-08-10 DIAGNOSIS — G451 Carotid artery syndrome (hemispheric): Secondary | ICD-10-CM | POA: Diagnosis not present

## 2014-08-10 DIAGNOSIS — I10 Essential (primary) hypertension: Secondary | ICD-10-CM | POA: Diagnosis not present

## 2014-08-10 DIAGNOSIS — N184 Chronic kidney disease, stage 4 (severe): Secondary | ICD-10-CM | POA: Diagnosis not present

## 2014-08-10 DIAGNOSIS — I4891 Unspecified atrial fibrillation: Secondary | ICD-10-CM | POA: Diagnosis not present

## 2014-08-10 DIAGNOSIS — G459 Transient cerebral ischemic attack, unspecified: Secondary | ICD-10-CM | POA: Diagnosis not present

## 2014-08-10 DIAGNOSIS — E785 Hyperlipidemia, unspecified: Secondary | ICD-10-CM | POA: Insufficient documentation

## 2014-08-10 DIAGNOSIS — G20 Parkinson's disease: Secondary | ICD-10-CM | POA: Diagnosis not present

## 2014-08-10 DIAGNOSIS — I480 Paroxysmal atrial fibrillation: Secondary | ICD-10-CM | POA: Diagnosis not present

## 2014-08-10 LAB — HEPARIN ANTI-XA: Heparin LMW: 1.35 IU/mL

## 2014-08-10 LAB — LIPID PANEL
CHOL/HDL RATIO: 2.8 ratio
Cholesterol: 124 mg/dL (ref 0–200)
HDL: 44 mg/dL (ref >39)
LDL Cholesterol: 56 mg/dL (ref 0–99)
Triglycerides: 121 mg/dL (ref <150)
VLDL: 24 mg/dL (ref 0–40)

## 2014-08-10 MED ORDER — LOSARTAN POTASSIUM 50 MG PO TABS: 50.0000 mg | ORAL_TABLET | Freq: Every day | ORAL | Status: DC

## 2014-08-10 MED ORDER — ASPIRIN EC 81 MG PO TBEC: 81.0000 mg | DELAYED_RELEASE_TABLET | Freq: Every day | ORAL | Status: DC

## 2014-08-10 NOTE — Discharge Summary (Signed)
Physician Discharge Summary  William [name] [id number] DOB: 10/30/1928 DOA: 08/09/2014  PCP: [name], MD  Admit date: 08/09/2014 Discharge date: 08/10/2014  Time spent: 35 minutes  Recommendations for Outpatient Follow-up:  1. Please follow-up on patient's heart rates, he was bradycardic during this hospitalization having ventricular rates in the 50s for which Cardizem was discontinued. 2. Follow-up on blood pressures, as mentioned above Cardizem discontinued due to bradycardia 3. Follow-up on BMP on hospital follow-up visit 4. Patient encouraged to keep his point with cardiology this Friday  Discharge Diagnoses:  Principal Problem:   TIA (transient ischemic attack) Active Problems:   Essential hypertension   BPH (benign prostatic hypertrophy)   Parkinson disease, symptomatic   Chronic kidney disease   Atrial fibrillation   Uncontrolled hypertension   Discharge Condition: Stable  Diet recommendation: Heart healthy  There were no vitals filed for this visit.  History of present illness:  81 year old male with history of Parkinson disease, hypertension, hyperlipidemia, recent A. fib started on Xarelto who presented to the ED with acute onset of slurred speech and some confusion. She was working on his computer at home this morning when his wife ostium what he wanted to have lunch. His wife noticed him to have slurred speech. Patient reports feeling confused. She then called 911. By the time they arrived the posterior speech had resolved. Fingersticks checked by EMS was 108. Vitals were stable.  Patient denies weakness or numbness. Wife denies noticing any facial droop. No loss of consciousness. headache, dizziness, fever, chills, nausea , vomiting, chest pain, palpitations, SOB, abdominal pain, bowel or urinary symptoms. Denies change in weight or appetite. Denies any recent trauma to the head.  Hospital Course:  Patient is a pleasant 81 year old woman with a past medical  history of [name], history of atrial fibrillation on chronic anticoagulation with Xarelto, admitted to the medicine service on 08/09/2014 when he presented with complaints of slurred speech associate with mental status changes. He was initially worked up with a CT scan of brain without contrast which did not reveal acute intracranial findings. Further workup included an MRI/MRA of brain which did not reveal acute intracranial process, no evidence of infarct, no acute large vessel occlusion or high-grade stenosis. Patient was seen and evaluated by neurology. Symptoms felt to be secondary to transient ischemic attack as symptoms resolved within 24 hours. Dr Erlinda Hong recommended adding aspirin 81 mg by mouth daily along with continuing anticoagulation. Other issues addressed during this hospitalization include bradycardia. He had a heart rates in the low 50's for which Cardizem was held. On discharge Cardizem was discontinued given heart rates consistently in the 50s. His Cozaar was increased from 25-50 mg by mouth daily for blood pressure control. He was instructed to keep his follow-up visits with cardiology this Friday.   Consultations:  Neurology/stroke team  Discharge Exam: Filed Vitals:   08/10/14 1415  BP: 127/95  Pulse: 56  Temp: 98.2 F (36.8 C)  Resp: 18    General: Patient is awake and alert, no acute distress, states feeling well, feels ready to go home today. Cardiovascular: Bradycardic, regular rate and rhythm normal S1-S2 Respiratory: Normal respiratory effort, lungs are clear to auscultation bilaterally Abdomen: Soft nontender nondistended Neurological: Patient having 5 out of 5 muscle strength to bilateral upper and lower extremities without alteration to sensation  Discharge Instructions   Discharge Instructions    Call MD for:  difficulty breathing, headache or visual disturbances    Complete by:  As directed  Call MD for:  extreme fatigue    Complete by:  As  directed      Call MD for:  hives    Complete by:  As directed      Call MD for:  persistant dizziness or light-headedness    Complete by:  As directed      Call MD for:  persistant nausea and vomiting    Complete by:  As directed      Call MD for:  redness, tenderness, or signs of infection (pain, swelling, redness, odor or green/yellow discharge around incision site)    Complete by:  As directed      Call MD for:  severe uncontrolled pain    Complete by:  As directed      Call MD for:  temperature >100.4    Complete by:  As directed      Diet - low sodium heart healthy    Complete by:  As directed      Increase activity slowly    Complete by:  As directed           Current Discharge Medication List    START taking these medications   Details  aspirin EC 81 MG tablet Take 1 tablet (81 mg total) by mouth daily. Qty: 30 tablet, Refills: 0      CONTINUE these medications which have CHANGED   Details  losartan (COZAAR) 50 MG tablet Take 1 tablet (50 mg total) by mouth daily. Qty: 30 tablet, Refills: 1      CONTINUE these medications which have NOT CHANGED   Details  atorvastatin (LIPITOR) 20 MG tablet Take 1 tablet (20 mg total) by mouth daily. Qty: 30 tablet, Refills: 11    carbidopa-levodopa (SINEMET IR) 25-100 MG per tablet Take 1 tablet by mouth 4 (four) times daily. At 8, 12, 5 PM, and 9 or 10 PM Qty: 360 tablet, Refills: 3   Associated Diagnoses: [name]    levothyroxine (SYNTHROID) 100 MCG tablet Take 1 tablet (100 mcg total) by mouth daily before breakfast. Qty: 90 tablet, Refills: 2    Probiotic Product (ALIGN) 4 MG CAPS Take 1 capsule by mouth daily.    rasagiline (AZILECT) 1 MG TABS tablet Take 1 tablet (1 mg total) by mouth daily. Qty: 90 tablet, Refills: 3   Associated Diagnoses: [name]    Rivaroxaban (XARELTO) 15 MG TABS tablet Take 1 tablet (15 mg total) by mouth daily with supper. Qty: 30 tablet, Refills: 11    tamsulosin  (FLOMAX) 0.4 MG CAPS capsule Take 1 capsule (0.4 mg total) by mouth daily. Qty: 30 capsule, Refills: 0    Acetaminophen 500 MG coapsule     Multiple Vitamin (MULTIVITAMIN WITH MINERALS) TABS Take 1 tablet by mouth daily.      STOP taking these medications     diltiazem (CARDIZEM) 60 MG tablet      UNABLE TO FIND        No Known Allergies Follow-up Information    Follow up with NADEL,SCOTT M, MD In 2 weeks.   Specialty:  Pulmonary Disease   Contact information:   [place] [address] [phone]       Follow up with Star Age, MD In 2 months.   Specialties:  Neurology, Radiology   Contact information:   [address] [place] [address] [phone]        The results of significant diagnostics from this hospitalization (including imaging, microbiology, ancillary  and laboratory) are listed below for reference.    Significant Diagnostic Studies: Dg Chest 2 View  07/17/2014   CLINICAL DATA:  Shortness of breath, dizziness and weakness for 1 day. History atrial fibrillation and hypertension. Ex-smoker. Initial encounter.  EXAM: CHEST  2 VIEW  COMPARISON:  Radiographs 05/14/2012 and 04/26/2014.  FINDINGS: The heart size and mediastinal contours are stable. There are multiple calcified mediastinal lymph nodes which are unchanged. The lungs are clear. There is no pleural effusion or pneumothorax. The bones appear unchanged.  IMPRESSION: No active cardiopulmonary process. Sequela of prior granulomatous disease.   Electronically Signed   By: Richardean Sale M.D.   On: 07/17/2014 14:31   Ct Head (brain) Wo Contrast  08/09/2014   CLINICAL DATA:  Transient dysarthria  EXAM: CT HEAD WITHOUT CONTRAST  TECHNIQUE: Contiguous axial images were obtained from the base of the skull through the vertex without intravenous contrast.  COMPARISON:  Brain MRI October 17, 2009  FINDINGS: There is mild diffuse atrophy. There is no intracranial mass, hemorrhage,  extra-axial fluid collection, or midline shift. There is mild patchy small vessel disease in the centra semiovale bilaterally. Elsewhere gray-white compartments appear normal. No acute infarct is apparent. There is evidence of a previous left frontal-parietal craniotomy with metallic fixation in the area of the craniotomy. Bony calvarium otherwise appears intact. The mastoid air cells are clear. There is a small medial left occipital scalp hematoma.  IMPRESSION: Mild atrophy with patchy periventricular small vessel disease, stable. Postoperative craniotomy left frontal-parietal region. No intracranial mass, hemorrhage, or acute appearing infarct. Small left occipital scalp hematoma. Question recent trauma in this area.   Electronically Signed   By: Lowella Grip III M.D.   On: 08/09/2014 16:08   Mri Brain Without Contrast  08/09/2014   CLINICAL DATA:  Episode of word forming difficulty. History of hyperlipidemia, hypertension, subdural hematoma, atrial fibrillation.  EXAM: MRI HEAD WITHOUT CONTRAST  MRA HEAD WITHOUT CONTRAST  TECHNIQUE: Multiplanar, multiecho pulse sequences of the brain and surrounding structures were obtained without intravenous contrast. Angiographic images of the head were obtained using MRA technique without contrast.  COMPARISON:  CT of the head August 09, 2014 at 1542 hours and MRI of the brain October 17, 2009  FINDINGS: MRI HEAD FINDINGS  No reduced diffusion to suggest acute ischemia. No susceptibility artifact to suggest hemorrhage. There is susceptibility artifact associated with patient's LEFT craniotomy. The ventricles and sulci are normal for patient's age. Scattered subcentimeter supratentorial white matter FLAIR T2 hyperintensities are less than expected for age, mildly advanced from prior imaging. No midline shift, mass effect or mass lesions.  No abnormal extra-axial fluid collections. Status post bilateral ocular lens implants. Trace paranasal sinus mucosal thickening without  air-fluid levels. The mastoid air cells are well aerated. Small midline frontal scalp lipoma. No suspicious calvarial bone marrow signal. No abnormal sellar expansion. No cerebellar tonsillar ectopia.  MRA HEAD FINDINGS  Anterior circulation: Normal flow related enhancement of the included cervical, petrous, cavernous and supra clinoid internal carotid arteries. Patent anterior communicating artery. Normal flow related enhancement of the anterior and middle cerebral arteries, including more distal segments.  No large vessel occlusion, high-grade stenosis, abnormal luminal irregularity, aneurysm.  Posterior circulation: LEFT vertebral artery is dominant. Basilar artery is patent, with normal flow related enhancement of the main branch vessels. Mid grade stenosis of RIGHT P2/3. Flow related enhancement of the posterior cerebral arteries.  No large vessel occlusion, high-grade stenosis, abnormal luminal irregularity, aneurysm.  IMPRESSION: MRI HEAD: No  acute intracranial process, specifically no acute ischemia.  Involutional changes. Mild white matter changes suggest chronic small vessel ischemic disease.  MRA HEAD: No acute large vessel occlusion or high-grade stenosis. Mid grade stenosis RIGHT P2/3 segment.   Electronically Signed   By: Elon Alas   On: 08/09/2014 23:53   Ct Abd Wo & W Cm  07/19/2014   CLINICAL DATA:  Followup right renal neoplasm 2 years status post percutaneous cryoablation.  EXAM: CT ABDOMEN WITHOUT AND WITH CONTRAST  TECHNIQUE: Multidetector CT imaging of the abdomen was performed following the standard protocol before and following the bolus administration of intravenous contrast.  CONTRAST:  135mL OMNIPAQUE IOHEXOL 300 MG/ML  SOLN  COMPARISON:  07/14/2013  FINDINGS: Lower chest: Nodular scarring in the medial right lower lobe remains stable compared to previous study.  Hepatobiliary: No masses or other significant abnormality identified. Prior cholecystectomy again noted. Chronic  biliary ductal dilatation remains stable.  Pancreas: No mass, inflammatory changes, or other significant abnormality identified.  Spleen:  Within normal limits in size and appearance.  Adrenal Glands:  No masses identified.  Kidneys: Further mild contraction of mixed soft tissue and fat attenuation is seen in the cryoablation zone in the lower pole of the right kidney. No evidence of recurrent renal mass. Other bilateral simple renal cysts are again seen in both kidneys, benign Bosniak category 1, which show no significant change since previous study. Severe right renal pelvicaliectasis is stable, without evidence of right ureteral dilatation, consistent with UPJ obstruction. No evidence of left-sided hydronephrosis.  Stomach/Bowel/Peritoneum: No evidence of wall thickening, mass, or obstruction involving visualized abdominal bowel. Colonic diverticulosis is demonstrated, without evidence of diverticulitis seen involving the visualized portion of the abdominal portion of the colon.  Vascular/Lymphatic: No pathologically enlarged lymph nodes identified. Mild infrarenal abdominal aortic aneurysm measures 3.9 x 3.4 cm and is mildly increased in size compared to 3.6 x 3.2 cm previously. No evidence of aneurysm leak or rupture.  Other:  None.  Musculoskeletal:  No suspicious bone lesions identified.  IMPRESSION: Further mild contraction of cryoablation zone in the lower pole the right kidney. No evidence of tumor recurrence or metastatic disease within the abdomen.  Stable bilateral renal cysts and chronic right UPJ obstruction.  Prior cholecystectomy, with stable chronic biliary ductal dilatation.  Mild increase in size of 3.9 cm infrarenal abdominal aortic aneurysm. Recommend followup by ultrasound in 2 years. This recommendation follows ACR consensus guidelines: White Paper of the ACR Incidental Findings Committee II on Vascular Findings. J Am Coll Radiol 2013; 10:789-794.   Electronically Signed   By: Earle Gell  M.D.   On: 07/19/2014 10:38   Mr Jodene Nam Head/brain Wo Cm  08/09/2014   CLINICAL DATA:  Episode of word forming difficulty. History of hyperlipidemia, hypertension, subdural hematoma, atrial fibrillation.  EXAM: MRI HEAD WITHOUT CONTRAST  MRA HEAD WITHOUT CONTRAST  TECHNIQUE: Multiplanar, multiecho pulse sequences of the brain and surrounding structures were obtained without intravenous contrast. Angiographic images of the head were obtained using MRA technique without contrast.  COMPARISON:  CT of the head August 09, 2014 at 1542 hours and MRI of the brain October 17, 2009  FINDINGS: MRI HEAD FINDINGS  No reduced diffusion to suggest acute ischemia. No susceptibility artifact to suggest hemorrhage. There is susceptibility artifact associated with patient's LEFT craniotomy. The ventricles and sulci are normal for patient's age. Scattered subcentimeter supratentorial white matter FLAIR T2 hyperintensities are less than expected for age, mildly advanced from prior imaging. No midline shift,  mass effect or mass lesions.  No abnormal extra-axial fluid collections. Status post bilateral ocular lens implants. Trace paranasal sinus mucosal thickening without air-fluid levels. The mastoid air cells are well aerated. Small midline frontal scalp lipoma. No suspicious calvarial bone marrow signal. No abnormal sellar expansion. No cerebellar tonsillar ectopia.  MRA HEAD FINDINGS  Anterior circulation: Normal flow related enhancement of the included cervical, petrous, cavernous and supra clinoid internal carotid arteries. Patent anterior communicating artery. Normal flow related enhancement of the anterior and middle cerebral arteries, including more distal segments.  No large vessel occlusion, high-grade stenosis, abnormal luminal irregularity, aneurysm.  Posterior circulation: LEFT vertebral artery is dominant. Basilar artery is patent, with normal flow related enhancement of the main branch vessels. Mid grade stenosis of RIGHT  P2/3. Flow related enhancement of the posterior cerebral arteries.  No large vessel occlusion, high-grade stenosis, abnormal luminal irregularity, aneurysm.  IMPRESSION: MRI HEAD: No acute intracranial process, specifically no acute ischemia.  Involutional changes. Mild white matter changes suggest chronic small vessel ischemic disease.  MRA HEAD: No acute large vessel occlusion or high-grade stenosis. Mid grade stenosis RIGHT P2/3 segment.   Electronically Signed   By: Elon Alas   On: 08/09/2014 23:53    Microbiology: No results found for this or any previous visit (from the past 240 hour(s)).   Labs: Basic Metabolic Panel:  Recent Labs Lab 08/09/14 1530 08/09/14 1536  NA 138 140  K 3.9 3.8  CL 98 97  CO2 30  --   GLUCOSE 92 91  BUN 15 17  CREATININE 1.43* 1.50*  CALCIUM 8.6  --    Liver Function Tests:  Recent Labs Lab 08/09/14 1530  AST 18  ALT 5  ALKPHOS 57  BILITOT 1.0  PROT 6.6  ALBUMIN 4.1   No results for input(s): LIPASE, AMYLASE in the last 168 hours. No results for input(s): AMMONIA in the last 168 hours. CBC:  Recent Labs Lab 08/09/14 1530 08/09/14 1536  WBC 5.3  --   NEUTROABS 3.8  --   HGB 15.6 16.7  HCT 47.8 49.0  MCV 91.7  --   PLT 140*  --    Cardiac Enzymes: No results for input(s): CKTOTAL, CKMB, CKMBINDEX, TROPONINI in the last 168 hours. BNP: BNP (last 3 results) No results for input(s): BNP in the last 8760 hours.  ProBNP (last 3 results) No results for input(s): PROBNP in the last 8760 hours.  CBG:  Recent Labs Lab 08/09/14 1503  GLUCAP 90       Signed:  Kelvin Cellar  [facility] 08/10/2014, 3:28 PM

## 2014-08-10 NOTE — Progress Notes (Signed)
UR completed 

## 2014-08-10 NOTE — Evaluation (Signed)
Physical Therapy Evaluation Patient Details Name: William [name] MRN: [id number] DOB: 1929/01/03 Today's Date: 08/10/2014   History of Present Illness  pt presents with Speech difficulties and L facial droop.  pt with hx of Parkinson's.    Clinical Impression  Pt moving well and per pt and family he seems to be back to baseline.  Pt will have family support at D/C and no further PT needs at this time.  Will sign off.      Follow Up Recommendations No PT follow up;Supervision - Intermittent    Equipment Recommendations  None recommended by PT    Recommendations for Other Services       Precautions / Restrictions Precautions Precautions: None Restrictions Weight Bearing Restrictions: No      Mobility  Bed Mobility Overal bed mobility: Modified Independent                Transfers Overall transfer level: Modified independent Equipment used: None                Ambulation/Gait Ambulation/Gait assistance: Modified independent (Device/Increase time) Ambulation Distance (Feet): 300 Feet Assistive device: None Gait Pattern/deviations: Step-through pattern     General Gait Details: pt moving well without deficit.  pt indicates his Parkinson's has made him move more slowly than previously, but is still able to be independent.    Stairs Stairs: Yes Stairs assistance: Modified independent (Device/Increase time) Stair Management: No rails;Step to pattern;Forwards Number of Stairs: 5 General stair comments: pt indicates his R knee has arthritis and that he moves stiffly on stairs.    Wheelchair Mobility    Modified Rankin (Stroke Patients Only) Modified Rankin (Stroke Patients Only) Pre-Morbid Rankin Score: No significant disability Modified Rankin: No significant disability     Balance Overall balance assessment: Modified Independent                                           Pertinent Vitals/Pain Pain Assessment: No/denies pain     Home Living Family/patient expects to be discharged to:: Private residence Living Arrangements: Spouse/significant other Available Help at Discharge: Family;Available 24 hours/day Type of Home: House Home Access: Stairs to enter Entrance Stairs-Rails: None Entrance Stairs-Number of Steps: 2 Home Layout: One level Home Equipment: None      Prior Function Level of Independence: Independent         Comments: pt gardens and plays golf regularly.       Hand Dominance        Extremity/Trunk Assessment   Upper Extremity Assessment: Overall WFL for tasks assessed           Lower Extremity Assessment: Overall WFL for tasks assessed      Cervical / Trunk Assessment: Normal  Communication   Communication: No difficulties  Cognition Arousal/Alertness: Awake/alert Behavior During Therapy: WFL for tasks assessed/performed Overall Cognitive Status: Within Functional Limits for tasks assessed                      General Comments      Exercises        Assessment/Plan    PT Assessment Patent does not need any further PT services  PT Diagnosis Difficulty walking   PT Problem List    PT Treatment Interventions     PT Goals (Current goals can be found in the Care Plan section) Acute Rehab PT Goals  Patient Stated Goal: Home PT Goal Formulation: All assessment and education complete, DC therapy    Frequency     Barriers to discharge        Co-evaluation               End of Session Equipment Utilized During Treatment: Gait belt Activity Tolerance: Patient tolerated treatment well Patient left: in chair;with call bell/phone within reach;with family/visitor present Nurse Communication: Mobility status         Time: 9470-9628 PT Time Calculation (min) (ACUTE ONLY): 24 min   Charges:   PT Evaluation $Initial PT Evaluation Tier I: 1 Procedure PT Treatments $Gait Training: 8-22 mins   PT G CodesCatarina Hartshorn,  [place] 08/10/2014, 11:49 AM

## 2014-08-10 NOTE — Progress Notes (Signed)
STROKE TEAM PROGRESS NOTE   HISTORY William [name] is an 81 y.o. male with recently diagnosed atrial fibrillation on Xaralto, hypertension, hyperlipidemia and [name], presenting to the waiting an episode of difficulty with difficulty forming words, in addition to a shoulder. Symptoms lasted 5-10 minutes. CT scan of his head showed no acute intracranial abnormality. NIH stroke score at the time of this evaluation was 0. He was LKW 2 PM on 08/09/2014. Patient was not administered TPA secondary to Deficits resolved; on Xaralto for atrial fibrillation. He was admitted for further evaluation and treatment.   SUBJECTIVE (INTERVAL HISTORY) His family is at the bedside.  Overall he feels his condition is resolved. He was found to have afib two weeks ago and now on Xarelto. Checked Xa level and he seems to respond to Xarelto. Family stated compliance.     OBJECTIVE Temp:  [97.7 F (36.5 C)-98.9 F (37.2 C)] 98.2 F (36.8 C) (04/27 0901) Pulse Rate:  [33-55] 50 (04/27 0901) Cardiac Rhythm:  [-] Bundle branch block;Sinus bradycardia (04/27 0727) Resp:  [12-22] 16 (04/27 0901) BP: (135-198)/(71-108) 178/100 mmHg (04/27 0901) SpO2:  [61 %-100 %] 96 % (04/27 0901)   Recent Labs Lab 08/09/14 1503  GLUCAP 90    Recent Labs Lab 08/09/14 1530 08/09/14 1536  NA 138 140  K 3.9 3.8  CL 98 97  CO2 30  --   GLUCOSE 92 91  BUN 15 17  CREATININE 1.43* 1.50*  CALCIUM 8.6  --     Recent Labs Lab 08/09/14 1530  AST 18  ALT 5  ALKPHOS 57  BILITOT 1.0  PROT 6.6  ALBUMIN 4.1    Recent Labs Lab 08/09/14 1530 08/09/14 1536  WBC 5.3  --   NEUTROABS 3.8  --   HGB 15.6 16.7  HCT 47.8 49.0  MCV 91.7  --   PLT 140*  --    No results for input(s): CKTOTAL, CKMB, CKMBINDEX, TROPONINI in the last 168 hours.  Recent Labs  08/09/14 1530  LABPROT 17.5*  INR 1.42   No results for input(s): COLORURINE, LABSPEC, PHURINE, GLUCOSEU, HGBUR, BILIRUBINUR, KETONESUR, PROTEINUR,  UROBILINOGEN, NITRITE, LEUKOCYTESUR in the last 72 hours.  Invalid input(s): APPERANCEUR     Component Value Date/Time   CHOL 124 08/10/2014 0354   TRIG 121 08/10/2014 0354   TRIG 101 03/25/2006 0935   HDL 44 08/10/2014 0354   CHOLHDL 2.8 08/10/2014 0354   CHOLHDL 2.9 CALC 03/25/2006 0935   VLDL 24 08/10/2014 0354   LDLCALC 56 08/10/2014 0354   Lab Results  Component Value Date   HGBA1C 5.6 07/17/2014   No results found for: LABOPIA, COCAINSCRNUR, LABBENZ, AMPHETMU, THCU, LABBARB  No results for input(s): ETH in the last 168 hours.  I have personally reviewed the radiological images below and agree with the radiology interpretations.  Ct Head (brain) Wo Contrast 08/09/2014    Mild atrophy with patchy periventricular small vessel disease, stable. Postoperative craniotomy left frontal-parietal region. No intracranial mass, hemorrhage, or acute appearing infarct. Small left occipital scalp hematoma. Question recent trauma in this area.     MRI HEAD 08/09/2014   No acute intracranial process, specifically no acute ischemia.  Involutional changes. Mild white matter changes suggest chronic small vessel ischemic disease.    MRA HEAD 08/09/2014   No acute large vessel occlusion or high-grade stenosis. Mid grade stenosis RIGHT P2/3 segment.     Carotid Doppler  There is 1-39% bilateral ICA stenosis. Vertebral artery flow is antegrade.  2D Echocardiogram   - Left ventricle: The cavity size was normal. Wall thickness wasincreased in a pattern of mild LVH. Systolic function was normal.The estimated ejection fraction was in the range of 60% to 65%.Wall motion was normal; there were no regional wall motionabnormalities. Doppler parameters are consistent with abnormalleft ventricular relaxation (grade 1 diastolic dysfunction). - Right ventricle: The cavity size was moderately dilated. - Right atrium: The atrium was mildly dilated.   PHYSICAL EXAM  Temp:  [97.9 F (36.6 C)-98.2 F  (36.8 C)] 98.2 F (36.8 C) (04/27 1415) Pulse Rate:  [50-56] 56 (04/27 1415) Resp:  [16-18] 18 (04/27 1415) BP: (127-184)/(82-100) 127/95 mmHg (04/27 1415) SpO2:  [61 %-98 %] 97 % (04/27 1415)  General - Well nourished, well developed, in no apparent distress.  Ophthalmologic - fundi not visualized due to small pupils.  Cardiovascular - Regular rhythm but bradycardia.  Mental Status -  Level of arousal and orientation to time, place, and person were intact. Language including expression, naming, repetition, comprehension was assessed and found intact, but dysarthria.  Cranial Nerves II - XII - II - Visual field intact OU. III, IV, VI - Extraocular movements intact. V - Facial sensation intact bilaterally. VII - Facial movement intact bilaterally. VIII - Hearing & vestibular intact bilaterally. X - Palate elevates symmetrically, but dysarthria. XI - Chin turning & shoulder shrug intact bilaterally. XII - Tongue protrusion intact.  Motor Strength - The patient's strength was normal in all extremities and pronator drift was absent.  Bulk was normal and fasciculations were absent.   Motor Tone - Muscle tone was assessed at the neck and appendages and was normal.  Reflexes - The patient's reflexes were 1+ in all extremities and he had no pathological reflexes.  Sensory - Light touch, temperature/pinprick were assessed and were symmetrical.    Coordination - The patient had normal movements in the hands and feet with no ataxia or dysmetria.  Resting tremor at the right hand.   Gait and Station - deferred.    ASSESSMENT/PLAN [name] is a 81 y.o. male with history of Parkinson disease, hypertension, hyperlipidemia, recent A. fib diagnosis started on Xarelto  presenting with 1 day of slurred speech. He did not receive IV t-PA due to delay in arrival & being on xarelto.   TIA - likely embolic secondary to afib on Xarelto  MRI  No acute stroke  MRA  No large vessel  stenosis  Carotid Doppler  No source of embolus   2D Echo  No source of embolus   LDL 56  HgbA1c 5.6  xareltofor VTE prophylaxis  Diet Heart Room service appropriate?: Yes; Fluid consistency:: Thin  xarelto ( rivaroxaban) 15 mg qd prior to admission, LMWH level within the therapeutic range, given TIA event on xarelot, recommend the addition of aspirin 81 mg orally every day   Patient counseled to be compliant with his antithrombotic medications  Ongoing aggressive stroke risk factor management  Therapy recommendations:  No PT  Disposition:  Home, no therapy needs  Follow up TIA with Dr. Rexene Alberts who follows his Parksinon's  Atrial Fibrillation  Home meds:  Xarelto, lower dose of 15 mg d/t age, kidney function  Cardizem on hold in hospital d/t HR 51  Continue Xarelto and add ASA 81  PD   On sinemet and rasagiline  Resting tremor right hand  Follows with Dr. Rexene Alberts in [facility]  Hypertension  Home meds:   Losartan  Elevated 170-180s  Hyperlipidemia  Home meds:  lipitor 20,  resumed in hospital  LDL 56, at goal < 70  Continue statin at discharge  Other Stroke Risk Factors  Advanced age  Former Cigarette smoker, quit smoking 49 years ago   ETOH use  Hx stroke/TIA - L SDH felt to be unknown traumatic source, hx TIA (he is not aware of the circumstances)  Other Active Problems  renal cyst with a tumor on the cyst, could be cancerous. They first drained the cyst and then did cryp of the tumor. CT done recently looks stable, no growth. bx inconclusive, likely that it is malignant   Hypothyroid on synthroid  BPH on flomax  [name]  On Sinemet  Followed by [facility] day #   Neurology will sign off. Please call with questions. Pt will follow up with Dr. Rexene Alberts at [facility] in July this year. Thanks for the consult.  Rosalin Hawking, MD PhD Stroke Neurology 08/10/2014 11:43 PM    To contact Stroke Continuity provider, please refer to  [email or web address] After hours, contact General Neurology

## 2014-08-10 NOTE — Progress Notes (Signed)
*  PRELIMINARY RESULTS* Vascular Ultrasound Carotid Duplex (Doppler) has been completed.  Findings suggest 1-39% internal carotid artery stenosis bilaterally. Vertebral arteries are patent with antegrade flow.  08/10/2014 3:37 PM Maudry Mayhew, RVT, RDCS, RDMS

## 2014-08-10 NOTE — Progress Notes (Signed)
Discharge orders received. Pt and spouse educated on d/c instructions and stroke education. Pt verbalized understanding. D/C packet and prescription given to pt. IV and tele removed. Pt taken downstairs by staff via wheelchair.

## 2014-08-11 LAB — HEMOGLOBIN A1C
Hgb A1c MFr Bld: 5.5 % (ref 4.8–5.6)
MEAN PLASMA GLUCOSE: 111 mg/dL

## 2014-08-12 ENCOUNTER — Encounter: Payer: Self-pay | Admitting: Cardiology

## 2014-08-12 ENCOUNTER — Ambulatory Visit (INDEPENDENT_AMBULATORY_CARE_PROVIDER_SITE_OTHER): Payer: Medicare HMO | Admitting: Cardiology

## 2014-08-12 VITALS — BP 156/74 | HR 56 | Ht 76.0 in | Wt 182.8 lb

## 2014-08-12 DIAGNOSIS — I10 Essential (primary) hypertension: Secondary | ICD-10-CM | POA: Diagnosis not present

## 2014-08-12 NOTE — Patient Instructions (Signed)
Your physician wants you to follow-up in: 4 months. You will receive a reminder letter in the mail two months in advance. If you don't receive a letter, please call our office to schedule the follow-up appointment.  Continue your current therapy.

## 2014-08-12 NOTE — Progress Notes (Signed)
Cardiology Office Note   Date:  08/12/2014   ID:  William [name], DOB 09-02-1928, MRN [id number]  PCP:  [name], MD  Cardiologist:   Minus Breeding, MD   Chief Complaint  Patient presents with  . Atrial Fibrillation      History of Present Illness: William [name] is a 81 y.o. male who presents for evaluation of atrial fibrillation. I met him earlier this month in the hospital when he presented with paroxysmal atrial fibrillation. Note he did have a borderline troponin elevation at that time.  He had a follow-up stress perfusion study which was negative. He converted spontaneously to sinus rhythm and was sent home on Cardizem and a reduced dose Xarelto. He went back to the hospital just a few days ago with strokelike symptoms. His MRI demonstrated no acute abnormalities. He was bradycardic and so Cardizem was discontinued. Aspirin was added to his medications. He has otherwise felt very well. He denies any ongoing cardiovascular symptoms. He denies any chest pressure, neck or arm discomfort. He's had no shortness of breath, PND or orthopnea. He has had no weight gain or edema.  Past Medical History  Diagnosis Date  . Hyperlipidemia   . Hx of colonic polyps   . Benign prostatic hypertrophy with urinary obstruction   . TIA (transient ischemic attack)     pt does not recall this hx "but may have had one today" (08/09/2014)  . Subdural hematoma 2006  . Gout   . Hypertension     sees [name]  . Thyroid disease     medullary thyroid   . Pneumonia     hx of in 1952  . Parkinson disease   . A-fib   . Complication of anesthesia     "difficulty intubation, had to use fiberoptic 2006"  . Difficult intubation   . Malignant neoplasm of thyroid gland     medullary carcinoma  . Primary skin squamous cell carcinoma   . Basal cell carcinoma   . Hypothyroidism   . DJD (degenerative joint disease)   . Arthritis     "right knee" (08/09/2014)  . Gout     "periodically"  (08/09/2014)  . Kidney tumor     "tumor on cyst on kidney"     Past Surgical History  Procedure Laterality Date  . Cataract extraction w/ intraocular lens  implant, bilateral Bilateral ~ 1998  . Cholecystectomy    . Thyroidectomy      medullary carcinoma  . Eye surgery    . Knee arthroscopy Right   . Appendectomy    . Tonsillectomy    . Lumbar laminectomy/decompression microdiscectomy  05/14/2012    Procedure: LUMBAR LAMINECTOMY/DECOMPRESSION MICRODISCECTOMY 1 LEVEL;  Surgeon: [name], MD;  Location: [facility];  Service: Neurosurgery;  Laterality: Left;  Left Lumbar four-five Laminectomy/Diskectomy/Far lateral diskectomy  . Cryoablation  07-10-12    "tumor on cyst on my kidney; had an ablation"  . Skin cancer excision  "several"    "back of my neck; right eye; clavicle; nose"  . Inguinal hernia repair Right   . Back surgery    . Subdural hematoma evacuation via craniotomy  2006     Current Outpatient Prescriptions  Medication Sig Dispense Refill  . Acetaminophen 500 MG coapsule     . aspirin EC 81 MG tablet Take 1 tablet (81 mg total) by mouth daily. 30 tablet 0  . atorvastatin (LIPITOR) 20 MG tablet Take 1 tablet (20 mg  total) by mouth daily. 30 tablet 11  . carbidopa-levodopa (SINEMET IR) 25-100 MG per tablet Take 1 tablet by mouth 4 (four) times daily. At 8, 12, 5 PM, and 9 or 10 PM 360 tablet 3  . levothyroxine (SYNTHROID) 100 MCG tablet Take 1 tablet (100 mcg total) by mouth daily before breakfast. 90 tablet 2  . losartan (COZAAR) 50 MG tablet Take 1 tablet (50 mg total) by mouth daily. 30 tablet 1  . Multiple Vitamin (MULTIVITAMIN WITH MINERALS) TABS Take 1 tablet by mouth daily.    . Probiotic Product (ALIGN) 4 MG CAPS Take 1 capsule by mouth daily.    . rasagiline (AZILECT) 1 MG TABS tablet Take 1 tablet (1 mg total) by mouth daily. 90 tablet 3  . Rivaroxaban (XARELTO) 15 MG TABS tablet Take 1 tablet (15 mg total) by mouth daily with supper. 30 tablet 11  .  tamsulosin (FLOMAX) 0.4 MG CAPS capsule Take 1 capsule (0.4 mg total) by mouth daily. 30 capsule 0   No current facility-administered medications for this visit.    Allergies:   Review of patient's allergies indicates no known allergies.    ROS:  Please see the history of present illness.   Otherwise, review of systems are positive for none.   All other systems are reviewed and negative.    PHYSICAL EXAM: VS:  BP 156/74 mmHg  Pulse 56  Ht _0  (1.93 m)  Wt 182 lb 12.8 oz (82.918 kg)  BMI 22.26 kg/m2 , BMI Body mass index is 22.26 kg/(m^2). GENERAL:  Well appearing HEENT:  Pupils equal round and reactive, fundi not visualized, oral mucosa unremarkable NECK:  No jugular venous distention, waveform within normal limits, carotid upstroke brisk and symmetric, no bruits, no thyromegaly LYMPHATICS:  No cervical, inguinal adenopathy LUNGS:  Clear to auscultation bilaterally BACK:  No CVA tenderness CHEST:  Unremarkable HEART:  PMI not displaced or sustained,S1 and S2 within normal limits, no S3, no S4, no clicks, no rubs, no murmurs ABD:  Flat, positive bowel sounds normal in frequency in pitch, no bruits, no rebound, no guarding, no midline pulsatile mass, no hepatomegaly, no splenomegaly EXT:  2 plus pulses throughout, no edema, no cyanosis no clubbing SKIN:  No rashes no nodules NEURO:  Motor grossly intact throughout, resting tremor, mild facial droop left side PSYCH:  Cognitively intact, oriented to person place and time      Recent Labs: 07/17/2014: TSH 0.532 08/09/2014: ALT 5; BUN 17; Creatinine 1.50*; Hemoglobin 16.7; Platelets 140*; Potassium 3.8; Sodium 140    Lipid Panel    Component Value Date/Time   CHOL 124 08/10/2014 0354   TRIG 121 08/10/2014 0354   TRIG 101 03/25/2006 0935   HDL 44 08/10/2014 0354   CHOLHDL 2.8 08/10/2014 0354   CHOLHDL 2.9 CALC 03/25/2006 0935   VLDL 24 08/10/2014 0354   LDLCALC 56 08/10/2014 0354      Wt Readings from Last 3 Encounters:    08/12/14 182 lb 12.8 oz (82.918 kg)  08/04/14 180 lb (81.647 kg)  07/22/14 185 lb 9.6 oz (84.188 kg)      Other studies Reviewed: Additional studies/ records that were reviewed today include:   Hospital records. Review of the above records demonstrates:  Please see elsewhere in the note.     ASSESSMENT AND PLAN:  ATRIAL FIB:  The patient has a CHA2DS2 - VASc score of 5 with a risk of stroke of 6.7%. At this point he will continue with the meds  as listed. No change in therapy is indicated.  ELEVATED TROPONIN:  This was mildly elevated in the face of his A. fib but he had a negative stress test. No further testing is indicated.  HTN:  His blood pressure is mildly elevated but I'm going to allow permissive hypertension .  No change in therapy is indicated at this point.  Current medicines are reviewed at length with the patient today.  The patient does not have concerns regarding medicines.  The following changes have been made:  no change  Labs/ tests ordered today include: None  No orders of the defined types were placed in this encounter.     Disposition:   FU with me in 4 months.    Signed, Minus Breeding, MD  08/12/2014 10:29 AM    [facility]

## 2014-08-15 NOTE — Progress Notes (Addendum)
Physical Therapy Note  Late entry for missed G-code.      08/27/14 1105  PT Visit Information  Last PT Received On 2014-08-27  PT G-Codes **NOT FOR INPATIENT CLASS**  Functional Assessment Tool Used Clinical Judgement  Functional Limitation Mobility: Walking and moving around  Mobility: Walking and Moving Around Current Status [phone]) [facility]  Mobility: Walking and Moving Around Goal Status [phone]) [facility]  Mobility: Walking and Moving Around Discharge Status [phone]) [place], [place]

## 2014-08-18 ENCOUNTER — Telehealth: Payer: Self-pay | Admitting: Pulmonary Disease

## 2014-08-18 ENCOUNTER — Emergency Department (HOSPITAL_COMMUNITY): Payer: Medicare HMO

## 2014-08-18 ENCOUNTER — Telehealth: Payer: Self-pay | Admitting: Cardiology

## 2014-08-18 ENCOUNTER — Encounter (HOSPITAL_COMMUNITY): Payer: Self-pay | Admitting: *Deleted

## 2014-08-18 ENCOUNTER — Emergency Department (HOSPITAL_COMMUNITY)
Admission: EM | Admit: 2014-08-18 | Discharge: 2014-08-18 | Disposition: A | Discharge status: Home / Self Care | Payer: Medicare HMO | Attending: Emergency Medicine | Admitting: Emergency Medicine

## 2014-08-18 DIAGNOSIS — R479 Unspecified speech disturbances: Secondary | ICD-10-CM

## 2014-08-18 DIAGNOSIS — R4781 Slurred speech: Secondary | ICD-10-CM | POA: Diagnosis present

## 2014-08-18 DIAGNOSIS — Z79899 Other long term (current) drug therapy: Secondary | ICD-10-CM | POA: Diagnosis not present

## 2014-08-18 DIAGNOSIS — Z8673 Personal history of transient ischemic attack (TIA), and cerebral infarction without residual deficits: Secondary | ICD-10-CM | POA: Diagnosis not present

## 2014-08-18 DIAGNOSIS — Z86018 Personal history of other benign neoplasm: Secondary | ICD-10-CM | POA: Diagnosis not present

## 2014-08-18 DIAGNOSIS — I10 Essential (primary) hypertension: Secondary | ICD-10-CM

## 2014-08-18 DIAGNOSIS — N401 Enlarged prostate with lower urinary tract symptoms: Secondary | ICD-10-CM | POA: Insufficient documentation

## 2014-08-18 DIAGNOSIS — Z85850 Personal history of malignant neoplasm of thyroid: Secondary | ICD-10-CM | POA: Diagnosis not present

## 2014-08-18 DIAGNOSIS — Z86010 Personal history of colonic polyps: Secondary | ICD-10-CM | POA: Diagnosis not present

## 2014-08-18 DIAGNOSIS — Z7901 Long term (current) use of anticoagulants: Secondary | ICD-10-CM | POA: Diagnosis not present

## 2014-08-18 DIAGNOSIS — G20 Parkinson's disease: Secondary | ICD-10-CM | POA: Diagnosis not present

## 2014-08-18 DIAGNOSIS — Z7982 Long term (current) use of aspirin: Secondary | ICD-10-CM | POA: Diagnosis not present

## 2014-08-18 DIAGNOSIS — Z8701 Personal history of pneumonia (recurrent): Secondary | ICD-10-CM | POA: Diagnosis not present

## 2014-08-18 DIAGNOSIS — Z85828 Personal history of other malignant neoplasm of skin: Secondary | ICD-10-CM | POA: Insufficient documentation

## 2014-08-18 DIAGNOSIS — M179 Osteoarthritis of knee, unspecified: Secondary | ICD-10-CM | POA: Diagnosis not present

## 2014-08-18 DIAGNOSIS — Z87891 Personal history of nicotine dependence: Secondary | ICD-10-CM | POA: Insufficient documentation

## 2014-08-18 DIAGNOSIS — E039 Hypothyroidism, unspecified: Secondary | ICD-10-CM | POA: Diagnosis not present

## 2014-08-18 DIAGNOSIS — E785 Hyperlipidemia, unspecified: Secondary | ICD-10-CM | POA: Diagnosis not present

## 2014-08-18 LAB — I-STAT CHEM 8, ED
BUN: 20 mg/dL (ref 6–20)
Calcium, Ion: 0.96 mmol/L — ABNORMAL LOW (ref 1.13–1.30)
Chloride: 101 mmol/L (ref 101–111)
Creatinine, Ser: 1.4 mg/dL — ABNORMAL HIGH (ref 0.61–1.24)
Glucose, Bld: 115 mg/dL — ABNORMAL HIGH (ref 70–99)
HEMATOCRIT: 48 % (ref 39.0–52.0)
Hemoglobin: 16.3 g/dL (ref 13.0–17.0)
POTASSIUM: 3.9 mmol/L (ref 3.5–5.1)
SODIUM: 139 mmol/L (ref 135–145)
TCO2: 26 mmol/L (ref 0–100)

## 2014-08-18 LAB — URINALYSIS, ROUTINE W REFLEX MICROSCOPIC
Bilirubin Urine: NEGATIVE
GLUCOSE, UA: NEGATIVE mg/dL
Ketones, ur: NEGATIVE mg/dL
LEUKOCYTES UA: NEGATIVE
NITRITE: NEGATIVE
PH: 7 (ref 5.0–8.0)
Protein, ur: NEGATIVE mg/dL
Specific Gravity, Urine: 1.007 (ref 1.005–1.030)
Urobilinogen, UA: 0.2 mg/dL (ref 0.0–1.0)

## 2014-08-18 LAB — DIFFERENTIAL
BASOS ABS: 0 10*3/uL (ref 0.0–0.1)
Basophils Relative: 0 % (ref 0–1)
Eosinophils Absolute: 0.1 10*3/uL (ref 0.0–0.7)
Eosinophils Relative: 2 % (ref 0–5)
LYMPHS PCT: 20 % (ref 12–46)
Lymphs Abs: 1 10*3/uL (ref 0.7–4.0)
MONOS PCT: 8 % (ref 3–12)
Monocytes Absolute: 0.4 10*3/uL (ref 0.1–1.0)
NEUTROS ABS: 3.4 10*3/uL (ref 1.7–7.7)
Neutrophils Relative %: 70 % (ref 43–77)

## 2014-08-18 LAB — COMPREHENSIVE METABOLIC PANEL
ALT: 5 U/L — ABNORMAL LOW (ref 17–63)
AST: 19 U/L (ref 15–41)
Albumin: 4 g/dL (ref 3.5–5.0)
Alkaline Phosphatase: 57 U/L (ref 38–126)
Anion gap: 8 (ref 5–15)
BUN: 15 mg/dL (ref 6–20)
CO2: 31 mmol/L (ref 22–32)
CREATININE: 1.4 mg/dL — AB (ref 0.61–1.24)
Calcium: 8.4 mg/dL — ABNORMAL LOW (ref 8.9–10.3)
Chloride: 100 mmol/L — ABNORMAL LOW (ref 101–111)
GFR calc Af Amer: 52 mL/min — ABNORMAL LOW (ref >60)
GFR calc non Af Amer: 45 mL/min — ABNORMAL LOW (ref >60)
Glucose, Bld: 117 mg/dL — ABNORMAL HIGH (ref 70–99)
Potassium: 3.8 mmol/L (ref 3.5–5.1)
Sodium: 139 mmol/L (ref 135–145)
TOTAL PROTEIN: 6.6 g/dL (ref 6.5–8.1)
Total Bilirubin: 1.1 mg/dL (ref 0.3–1.2)

## 2014-08-18 LAB — CBC
HCT: 46.8 % (ref 39.0–52.0)
Hemoglobin: 15.2 g/dL (ref 13.0–17.0)
MCH: 30 pg (ref 26.0–34.0)
MCHC: 32.5 g/dL (ref 30.0–36.0)
MCV: 92.3 fL (ref 78.0–100.0)
PLATELETS: 125 10*3/uL — AB (ref 150–400)
RBC: 5.07 MIL/uL (ref 4.22–5.81)
RDW: 14.3 % (ref 11.5–15.5)
WBC: 4.9 10*3/uL (ref 4.0–10.5)

## 2014-08-18 LAB — PROTIME-INR
INR: 1.56 — ABNORMAL HIGH (ref 0.00–1.49)
Prothrombin Time: 18.8 seconds — ABNORMAL HIGH (ref 11.6–15.2)

## 2014-08-18 LAB — APTT: APTT: 37 s (ref 24–37)

## 2014-08-18 LAB — I-STAT TROPONIN, ED: TROPONIN I, POC: 0.01 ng/mL (ref 0.00–0.08)

## 2014-08-18 LAB — RAPID URINE DRUG SCREEN, HOSP PERFORMED
AMPHETAMINES: NOT DETECTED
BARBITURATES: NOT DETECTED
BENZODIAZEPINES: NOT DETECTED
COCAINE: NOT DETECTED
Opiates: NOT DETECTED
TETRAHYDROCANNABINOL: NOT DETECTED

## 2014-08-18 LAB — CBG MONITORING, ED: Glucose-Capillary: 115 mg/dL — ABNORMAL HIGH (ref 70–99)

## 2014-08-18 LAB — URINE MICROSCOPIC-ADD ON

## 2014-08-18 LAB — ETHANOL: Alcohol, Ethyl (B): <5 mg/dL (ref <5)

## 2014-08-18 MED ORDER — LOSARTAN POTASSIUM-HCTZ 100-12.5 MG PO TABS: 1.0000 | ORAL_TABLET | Freq: Every day | ORAL | Status: DC

## 2014-08-18 NOTE — ED Notes (Signed)
Patient transported to X-ray 

## 2014-08-18 NOTE — Telephone Encounter (Signed)
[name] called in stating that the pt's BP is at 212/116 and HR at 56. She states that she called his PCP and they instructed him to go to the ER but she is not sure if that should happened. Please advise pt  Thanks

## 2014-08-18 NOTE — ED Provider Notes (Signed)
CSN: [id number]     Arrival date & time 08/18/14  1115 History   First MD Initiated Contact with Patient 08/18/14 1119     Chief Complaint  Patient presents with  . Code Stroke    @EDPCLEARED @ (Consider location/radiation/quality/duration/timing/severity/associated sxs/prior Treatment) The history is provided by the patient.   patient presents as a code stroke. Patient had called his primary care doctor because he had been hypertensive. Has a history of hypertension and atrial fibrillation. Recent admission for TIA with negative MRI. Pressures of 2:42 systolic last night and today. Told to come in to the ER because his speech was slurred when he called his doctor. Patient states he has Parkinson's and slurred speech is not abnormal. This difficulty speaking was no different than his Parkinson's. He states will come and go somewhat. No chest pain. No trouble breathing. He already is on Xarelto and asprin.  Past Medical History  Diagnosis Date  . Hyperlipidemia   . Hx of colonic polyps   . Benign prostatic hypertrophy with urinary obstruction   . TIA (transient ischemic attack)     pt does not recall this hx "but may have had one today" (08/09/2014)  . Subdural hematoma 2006  . Gout   . Hypertension     sees [name]  . Thyroid disease     medullary thyroid   . Pneumonia     hx of in 1952  . Parkinson disease   . A-fib   . Complication of anesthesia     "difficulty intubation, had to use fiberoptic 2006"  . Difficult intubation   . Malignant neoplasm of thyroid gland     medullary carcinoma  . Primary skin squamous cell carcinoma   . Basal cell carcinoma   . Hypothyroidism   . DJD (degenerative joint disease)   . Arthritis     "right knee" (08/09/2014)  . Gout     "periodically" (08/09/2014)  . Kidney tumor     "tumor on cyst on kidney"    Past Surgical History  Procedure Laterality Date  . Cataract extraction w/ intraocular lens  implant, bilateral Bilateral ~ 1998   . Cholecystectomy    . Thyroidectomy      medullary carcinoma  . Eye surgery    . Knee arthroscopy Right   . Appendectomy    . Tonsillectomy    . Lumbar laminectomy/decompression microdiscectomy  05/14/2012    Procedure: LUMBAR LAMINECTOMY/DECOMPRESSION MICRODISCECTOMY 1 LEVEL;  Surgeon: [name], MD;  Location: [place] [facility];  Service: Neurosurgery;  Laterality: Left;  Left Lumbar four-five Laminectomy/Diskectomy/Far lateral diskectomy  . Cryoablation  07-10-12    "tumor on cyst on my kidney; had an ablation"  . Skin cancer excision  "several"    "back of my neck; right eye; clavicle; nose"  . Inguinal hernia repair Right   . Back surgery    . Subdural hematoma evacuation via craniotomy  2006   Family History  Problem Relation Age of Onset  . Stroke Mother   . Alzheimer's disease Brother   . Stroke Father   . Alzheimer's disease Sister    History  Substance Use Topics  . Smoking status: Former Smoker -- 0.80 packs/day for 15 years    Types: Cigarettes    Quit date: 04/15/1965  . Smokeless tobacco: Never Used  . Alcohol Use: 6.0 oz/week    0 Standard drinks or equivalent, 10 Glasses of wine per week     Comment: 1 glass of wine  every night (6oz glass)    Review of Systems  Constitutional: Negative for activity change and appetite change.  Eyes: Negative for pain.  Respiratory: Negative for chest tightness and shortness of breath.   Cardiovascular: Negative for chest pain and leg swelling.  Gastrointestinal: Negative for nausea, vomiting, abdominal pain and diarrhea.  Genitourinary: Negative for flank pain.  Musculoskeletal: Negative for back pain and neck stiffness.  Skin: Negative for rash.  Neurological: Positive for tremors and speech difficulty. Negative for weakness, numbness and headaches.  Psychiatric/Behavioral: Negative for behavioral problems.      Allergies  Review of patient's allergies indicates no known allergies.  Home Medications   Prior to  Admission medications   Medication Sig Start Date End Date Taking? Authorizing Provider  aspirin EC 81 MG tablet Take 1 tablet (81 mg total) by mouth daily. 08/10/14  Yes Kelvin Cellar, MD  atorvastatin (LIPITOR) 20 MG tablet Take 1 tablet (20 mg total) by mouth daily. 07/18/14 07/18/15 Yes Florencia Reasons, MD  carbidopa-levodopa (SINEMET IR) 25-100 MG per tablet Take 1 tablet by mouth 4 (four) times daily. At 8, 12, 5 PM, and 9 or 10 PM 05/04/14  Yes Star Age, MD  levothyroxine (SYNTHROID) 100 MCG tablet Take 1 tablet (100 mcg total) by mouth daily before breakfast. 08/08/14 08/08/15 Yes [name], MD  Multiple Vitamin (MULTIVITAMIN WITH MINERALS) TABS Take 1 tablet by mouth daily.   Yes Historical Provider, MD  Probiotic Product (ALIGN) 4 MG CAPS Take 1 capsule by mouth daily.   Yes Historical Provider, MD  rasagiline (AZILECT) 1 MG TABS tablet Take 1 tablet (1 mg total) by mouth daily. 05/04/14  Yes Star Age, MD  Rivaroxaban (XARELTO) 15 MG TABS tablet Take 1 tablet (15 mg total) by mouth daily with supper. 07/18/14  Yes Florencia Reasons, MD  tamsulosin (FLOMAX) 0.4 MG CAPS capsule Take 1 capsule (0.4 mg total) by mouth daily. 07/18/14  Yes Florencia Reasons, MD  losartan-hydrochlorothiazide (HYZAAR) 100-12.5 MG per tablet Take 1 tablet by mouth daily. 08/18/14   Davonna Belling, MD   BP 136/82 mmHg  Pulse 52  Temp(Src) 97.9 F (36.6 C)  Resp 20  Ht 6\' 4"  (1.93 m)  Wt 180 lb (81.647 kg)  BMI 21.92 kg/m2  SpO2 96% Physical Exam  Constitutional: He appears well-developed and well-nourished.  HENT:  Head: Normocephalic.  Neck: Normal range of motion.  Cardiovascular: Normal rate and regular rhythm.   Pulmonary/Chest: Effort normal.  Abdominal: Soft.  Musculoskeletal: Normal range of motion.  Neurological: He is alert.  Complete NIH scoring done by neurology. Does have tremor on the right upper extremity. Mildly slurred speech. Eye movements intact. Good grips bilaterally good straight leg raise bilaterally.   Skin: Skin is warm.    ED Course  Procedures (including critical care time) Labs Review Labs Reviewed  PROTIME-INR - Abnormal; Notable for the following:    Prothrombin Time 18.8 (*)    INR 1.56 (*)    All other components within normal limits  CBC - Abnormal; Notable for the following:    Platelets 125 (*)    All other components within normal limits  COMPREHENSIVE METABOLIC PANEL - Abnormal; Notable for the following:    Chloride 100 (*)    Glucose, Bld 117 (*)    Creatinine, Ser 1.40 (*)    Calcium 8.4 (*)    ALT 5 (*)    GFR calc non Af Amer 45 (*)    GFR calc Af Amer 52 (*)  All other components within normal limits  URINALYSIS, ROUTINE W REFLEX MICROSCOPIC - Abnormal; Notable for the following:    Hgb urine dipstick MODERATE (*)    All other components within normal limits  I-STAT CHEM 8, ED - Abnormal; Notable for the following:    Creatinine, Ser 1.40 (*)    Glucose, Bld 115 (*)    Calcium, Ion 0.96 (*)    All other components within normal limits  CBG MONITORING, ED - Abnormal; Notable for the following:    Glucose-Capillary 115 (*)    All other components within normal limits  ETHANOL  APTT  DIFFERENTIAL  URINE RAPID DRUG SCREEN (HOSP PERFORMED)  URINE MICROSCOPIC-ADD ON  Randolm Idol, ED    Imaging Review Dg Chest 2 View  08/18/2014   CLINICAL DATA:  Hypertension, atrial fibrillation  EXAM: CHEST  2 VIEW  COMPARISON:  07/17/2014  FINDINGS: The heart size and mediastinal contours are within normal limits. Both lungs are clear. The visualized skeletal structures are unremarkable. Multiple calcified mediastinal lymph nodes are again seen.  IMPRESSION: No active cardiopulmonary disease.   Electronically Signed   By: Inez Catalina M.D.   On: 08/18/2014 12:24   Ct Head Wo Contrast  08/18/2014   CLINICAL DATA:  Acute onset slurred speech  EXAM: CT HEAD WITHOUT CONTRAST  TECHNIQUE: Contiguous axial images were obtained from the base of the skull through the  vertex without intravenous contrast.  COMPARISON:  Head CT August 09, 2014; brain MRI August 09, 2014.  FINDINGS: Mild diffuse atrophy is stable. There is no intracranial mass, hemorrhage, extra-axial fluid collection, or midline shift. Mild small vessel disease in the centra semiovale bilaterally is stable. There is no new gray-white compartment lesion. No acute appearing infarct. Each middle cerebral artery shows stable symmetric attenuation. Patient is status post craniotomy at the left frontal -parietal junction. Bony calvarium otherwise intact. Mastoid air cells are clear. There is probable cerumen in the left external auditory canal.  IMPRESSION: Mild atrophy with stable mild small vessel disease. No intracranial mass, hemorrhage, or acute appearing infarct. Postoperative change on the left. Probable cerumen in the left external auditory canal.  Critical Value/emergent results were called by telephone at the time of interpretation on 08/18/2014 at 11:38 am to Dr. Davonna Belling, who verbally acknowledged these results.   Electronically Signed   By: Lowella Grip III M.D.   On: 08/18/2014 11:38     EKG Interpretation   Date/Time:  Thursday Aug 18 2014 11:53:04 EDT Ventricular Rate:  163 PR Interval:    QRS Duration: 101 QT Interval:  329 QTC Calculation: 542 R Axis:   -93 Text Interpretation:  Sinus rhythm likely sinus rhythm but artifact from  parkinsons makes interpretation difficult Right superior axis Confirmed by  Alvino Chapel  MD, Ovid Curd [phone]) on 08/18/2014 12:27:27 PM      MDM   Final diagnoses:  Essential hypertension  Parkinson disease, symptomatic    Patient had been brought in as a code stroke. Reportedly had slurred speech with hypertension. Had called PCP due to hypertension and end up getting sent in because of the slurred speech. This appears to be his baseline speech pattern but is off to his Parkinson's. Blood pressures is increased but has had recent adjustment of  medications due to his stroke. I discussed with cardiology and will start him back on his Hyzaar instead of his Cozaar. Will discharge home. Has been seen by neurology and no further stroke workup as needed.  Davonna Belling, MD 08/19/14 1019

## 2014-08-18 NOTE — ED Notes (Signed)
MD updated and aware of pt BP

## 2014-08-18 NOTE — Discharge Instructions (Signed)

## 2014-08-18 NOTE — Telephone Encounter (Signed)
Patient says that he is having problems with his BP again.  Patient had TIA 2 weeks ago.  Patient's BP was 211/115 and last night.  Heavy feeling in head, patient was slurring speech on telephone.  Advised patient to call ambulance to take him to ER.    FYI to Dr. Lenna Gilford.

## 2014-08-18 NOTE — ED Notes (Signed)
Code stroke called; RN aware; Charge aware; paging out now.

## 2014-08-18 NOTE — Code Documentation (Signed)
81yo male arriving to [facility] via private vehicle at 1115.  Patient with elevated BP and on arrival to the ED noticed to have slurred speech.  Code Stroke activated.  Patient to CT with ED RN.  Stroke team to the bedside.  Patient reports that he took his last dose of Xarelto last night.  Patient back to room with ED RN.  NIHSS 0 per ED RN.  Patient is contraindicated for tPA d/t recent Xarelto dose.  Bedside handoff with ED RN Hayley.

## 2014-08-18 NOTE — ED Notes (Signed)
Pt presents vis POV for slurred speech and high BP.  Code stroke called in triage.  Ct scan completed, to room with Neuro MD.  Pt a x 4, speech comprehensible.  Wife at bedside.

## 2014-08-18 NOTE — Telephone Encounter (Signed)
Pt. Instructed to follow the advise of the PCP and go to the ER, Pts wife agreed with plan

## 2014-08-18 NOTE — Consult Note (Signed)
Referring Physician: Alvino Chapel    Chief Complaint: Slurred speech  HPI: William [name] is an 81 y.o. male with a history of PD who presented to the ED for high blood pressure and slurred speech.  Per wife the patient often has some slurring of speech, particularly in the morning so initially his slurred speech was not of concern.  They checked his blood pressure though and when found to be elevated he was brought to the ED.  Code stroke was called in the ED.  Initial NIHSS of 0.   Patient was last seen on 4/26 when he presented with an episode of difficulty with speech and left facial droop.  MRI was unremarkable.  Doppler and echo were unremarkable.  ASA was added.    Date last known well: Date: 08/18/2014 Time last known well: Time: 09:00 tPA Given: No: Resolution of symptoms  Past Medical History  Diagnosis Date  . Hyperlipidemia   . Hx of colonic polyps   . Benign prostatic hypertrophy with urinary obstruction   . TIA (transient ischemic attack)     pt does not recall this hx "but may have had one today" (08/09/2014)  . Subdural hematoma 2006  . Gout   . Hypertension     sees [name]  . Thyroid disease     medullary thyroid   . Pneumonia     hx of in 1952  . Parkinson disease   . A-fib   . Complication of anesthesia     "difficulty intubation, had to use fiberoptic 2006"  . Difficult intubation   . Malignant neoplasm of thyroid gland     medullary carcinoma  . Primary skin squamous cell carcinoma   . Basal cell carcinoma   . Hypothyroidism   . DJD (degenerative joint disease)   . Arthritis     "right knee" (08/09/2014)  . Gout     "periodically" (08/09/2014)  . Kidney tumor     "tumor on cyst on kidney"     Past Surgical History  Procedure Laterality Date  . Cataract extraction w/ intraocular lens  implant, bilateral Bilateral ~ 1998  . Cholecystectomy    . Thyroidectomy      medullary carcinoma  . Eye surgery    . Knee arthroscopy Right   .  Appendectomy    . Tonsillectomy    . Lumbar laminectomy/decompression microdiscectomy  05/14/2012    Procedure: LUMBAR LAMINECTOMY/DECOMPRESSION MICRODISCECTOMY 1 LEVEL;  Surgeon: [name], MD;  Location: [place] [facility];  Service: Neurosurgery;  Laterality: Left;  Left Lumbar four-five Laminectomy/Diskectomy/Far lateral diskectomy  . Cryoablation  07-10-12    "tumor on cyst on my kidney; had an ablation"  . Skin cancer excision  "several"    "back of my neck; right eye; clavicle; nose"  . Inguinal hernia repair Right   . Back surgery    . Subdural hematoma evacuation via craniotomy  2006    Family History  Problem Relation Age of Onset  . Stroke Mother   . Alzheimer's disease Brother   . Stroke Father   . Alzheimer's disease Sister    Social History:  reports that he quit smoking about 49 years ago. His smoking use included Cigarettes. He has a 12 pack-year smoking history. He has never used smokeless tobacco. He reports that he drinks about 6.0 oz of alcohol per week. He reports that he does not use illicit drugs.  Allergies: No Known Allergies  Medications: I have reviewed the patient's current  medications. Prior to Admission:  Current outpatient prescriptions:  .  Acetaminophen 500 MG coapsule, , Disp: , Rfl:  .  aspirin EC 81 MG tablet, Take 1 tablet (81 mg total) by mouth daily., Disp: 30 tablet, Rfl: 0 .  atorvastatin (LIPITOR) 20 MG tablet, Take 1 tablet (20 mg total) by mouth daily., Disp: 30 tablet, Rfl: 11 .  carbidopa-levodopa (SINEMET IR) 25-100 MG per tablet, Take 1 tablet by mouth 4 (four) times daily. At 8, 12, 5 PM, and 9 or 10 PM, Disp: 360 tablet, Rfl: 3 .  levothyroxine (SYNTHROID) 100 MCG tablet, Take 1 tablet (100 mcg total) by mouth daily before breakfast., Disp: 90 tablet, Rfl: 2 .  losartan (COZAAR) 50 MG tablet, Take 1 tablet (50 mg total) by mouth daily., Disp: 30 tablet, Rfl: 1 .  Multiple Vitamin (MULTIVITAMIN WITH MINERALS) TABS, Take 1 tablet by mouth  daily., Disp: , Rfl:  .  Probiotic Product (ALIGN) 4 MG CAPS, Take 1 capsule by mouth daily., Disp: , Rfl:  .  rasagiline (AZILECT) 1 MG TABS tablet, Take 1 tablet (1 mg total) by mouth daily., Disp: 90 tablet, Rfl: 3 .  Rivaroxaban (XARELTO) 15 MG TABS tablet, Take 1 tablet (15 mg total) by mouth daily with supper., Disp: 30 tablet, Rfl: 11 .  tamsulosin (FLOMAX) 0.4 MG CAPS capsule, Take 1 capsule (0.4 mg total) by mouth daily., Disp: 30 capsule, Rfl: 0  ROS: History obtained from the patient  General ROS: negative for - chills, fatigue, fever, night sweats, weight gain or weight loss Psychological ROS: negative for - behavioral disorder, hallucinations, memory difficulties, mood swings or suicidal ideation Ophthalmic ROS: negative for - blurry vision, double vision, eye pain or loss of vision ENT ROS: negative for - epistaxis, nasal discharge, oral lesions, sore throat, tinnitus or vertigo Allergy and Immunology ROS: negative for - hives or itchy/watery eyes Hematological and Lymphatic ROS: negative for - bleeding problems, bruising or swollen lymph nodes Endocrine ROS: negative for - galactorrhea, hair pattern changes, polydipsia/polyuria or temperature intolerance Respiratory ROS: negative for - cough, hemoptysis, shortness of breath or wheezing Cardiovascular ROS: negative for - chest pain, dyspnea on exertion, edema or irregular heartbeat Gastrointestinal ROS: negative for - abdominal pain, diarrhea, hematemesis, nausea/vomiting or stool incontinence Genito-Urinary ROS: negative for - dysuria, hematuria, incontinence or urinary frequency/urgency Musculoskeletal ROS: negative for - joint swelling or muscular weakness Neurological ROS: as noted in HPI Dermatological ROS: negative for rash and skin lesion changes  Physical Examination: Blood pressure 189/93, pulse 51, temperature 98.2 F (36.8 C), resp. rate 16, height 6\' 4"  (1.93 m), weight 81.647 kg (180 lb), SpO2 99 %.  HEENT-   Normocephalic, no lesions, without obvious abnormality.  Normal external eye and conjunctiva.  Normal TM's bilaterally.  Normal auditory canals and external ears. Normal external nose, mucus membranes and septum.  Normal pharynx. Cardiovascular- S1, S2 normal, pulses palpable throughout   Lungs- chest clear, no wheezing, rales, normal symmetric air entry, Heart exam - S1, S2 normal, no murmur, no gallop, rate regular Abdomen- soft, non-tender; bowel sounds normal; no masses,  no organomegaly Extremities- no edema Lymph-no adenopathy palpable Musculoskeletal-no joint tenderness, deformity or swelling Skin-warm and dry, no hyperpigmentation, vitiligo, or suspicious lesions  Neurological Examination Mental Status: Alert, oriented, thought content appropriate.  Speech fluent without evidence of aphasia.  Able to follow 3 step commands without difficulty. Cranial Nerves: II: Discs flat bilaterally; Visual fields grossly normal, pupils equal, round, reactive to light and accommodation III,IV, VI:  ptosis not present, extra-ocular motions intact bilaterally V,VII: smile symmetric, facial light touch sensation normal bilaterally VIII: hearing normal bilaterally IX,X: gag reflex present XI: bilateral shoulder shrug XII: midline tongue extension Motor: Right : Upper extremity   5/5    Left:     Upper extremity   5/5  Lower extremity   5/5     Lower extremity   5/5 Tone and bulk:normal tone throughout; no atrophy noted. RUE tremor. Sensory: Pinprick and light touch intact throughout, bilaterally Deep Tendon Reflexes: 1+ and symmetric throughout Plantars: Right: mute   Left: mute Cerebellar: normal finger-to-nose and normal heel-to-shin testing bilaterally   Laboratory Studies:  Basic Metabolic Panel:  Recent Labs Lab 08/18/14 1153  NA 139  K 3.9  CL 101  GLUCOSE 115*  BUN 20  CREATININE 1.40*    Liver Function Tests: No results for input(s): AST, ALT, ALKPHOS, BILITOT, PROT,  ALBUMIN in the last 168 hours. No results for input(s): LIPASE, AMYLASE in the last 168 hours. No results for input(s): AMMONIA in the last 168 hours.  CBC:  Recent Labs Lab 08/18/14 1141 08/18/14 1153  WBC 4.9  --   NEUTROABS 3.4  --   HGB 15.2 16.3  HCT 46.8 48.0  MCV 92.3  --   PLT 125*  --     Cardiac Enzymes: No results for input(s): CKTOTAL, CKMB, CKMBINDEX, TROPONINI in the last 168 hours.  BNP: Invalid input(s): POCBNP  CBG:  Recent Labs Lab 08/18/14 1137  GLUCAP 115*    Microbiology: Results for orders placed or performed during the hospital encounter of 07/02/12  Surgical pcr screen     Status: None   Collection Time: 07/02/12  1:37 PM  Result Value Ref Range Status   MRSA, PCR NEGATIVE NEGATIVE Final   Staphylococcus aureus NEGATIVE NEGATIVE Final    Comment:        The Xpert SA Assay (FDA approved for NASAL specimens in patients over 78 years of age), is one component of a comprehensive surveillance program.  Test performance has been validated by [employer] for patients greater than or equal to 22 year old. It is not intended to diagnose infection nor to guide or monitor treatment.    Coagulation Studies:  Recent Labs  08/18/14 1141  LABPROT 18.8*  INR 1.56*    Urinalysis: No results for input(s): COLORURINE, LABSPEC, PHURINE, GLUCOSEU, HGBUR, BILIRUBINUR, KETONESUR, PROTEINUR, UROBILINOGEN, NITRITE, LEUKOCYTESUR in the last 168 hours.  Invalid input(s): APPERANCEUR  Lipid Panel:    Component Value Date/Time   CHOL 124 08/10/2014 0354   TRIG 121 08/10/2014 0354   TRIG 101 03/25/2006 0935   HDL 44 08/10/2014 0354   CHOLHDL 2.8 08/10/2014 0354   CHOLHDL 2.9 CALC 03/25/2006 0935   VLDL 24 08/10/2014 0354   LDLCALC 56 08/10/2014 0354    HgbA1C:  Lab Results  Component Value Date   HGBA1C 5.5 08/10/2014    Urine Drug Screen:  No results found for: LABOPIA, COCAINSCRNUR, LABBENZ, AMPHETMU, THCU, LABBARB  Alcohol Level: No  results for input(s): ETH in the last 168 hours.  Other results: EKG: atrial fibrillation.  Imaging: Ct Head Wo Contrast  08/18/2014   CLINICAL DATA:  Acute onset slurred speech  EXAM: CT HEAD WITHOUT CONTRAST  TECHNIQUE: Contiguous axial images were obtained from the base of the skull through the vertex without intravenous contrast.  COMPARISON:  Head CT August 09, 2014; brain MRI August 09, 2014.  FINDINGS: Mild diffuse atrophy is stable. There is  no intracranial mass, hemorrhage, extra-axial fluid collection, or midline shift. Mild small vessel disease in the centra semiovale bilaterally is stable. There is no new gray-white compartment lesion. No acute appearing infarct. Each middle cerebral artery shows stable symmetric attenuation. Patient is status post craniotomy at the left frontal -parietal junction. Bony calvarium otherwise intact. Mastoid air cells are clear. There is probable cerumen in the left external auditory canal.  IMPRESSION: Mild atrophy with stable mild small vessel disease. No intracranial mass, hemorrhage, or acute appearing infarct. Postoperative change on the left. Probable cerumen in the left external auditory canal.  Critical Value/emergent results were called by telephone at the time of interpretation on 08/18/2014 at 11:38 am to Dr. Davonna Belling, who verbally acknowledged these results.   Electronically Signed   By: Lowella Grip III M.D.   On: 08/18/2014 11:38    Assessment: 81 y.o. male presenting with an episode of slurred speech.  Unclear if this was actually a TIA Since wife reports that he often has difficulties in the mornings and he was actually brought for elevated BP.  Patient on ASA and Xarelto.  ASA just added about a week ago.  Work up at that time was unremarkable.  Head CT personally reviewed today shows no acute changes.    Stroke Risk Factors - atrial fibrillation, hyperlipidemia and hypertension  Plan: 1. Prophylactic therapy-Continue Xarelto and  ASA 2. BP control 3. Repeat stroke w/u not indicated at this time.  Patient to keep scheduled f/u with [facility]  Case discussed with Dr. Mordecai Maes, MD [facility] [phone] 08/18/2014, 12:20 PM

## 2014-08-18 NOTE — ED Notes (Signed)
Pt returned from xray

## 2014-08-18 NOTE — ED Notes (Signed)
Patient is resting comfortably. 

## 2014-08-18 NOTE — ED Notes (Addendum)
EDP aware of BP and HR

## 2014-08-18 NOTE — ED Notes (Signed)
Lab results of I-stat Troponin of 0.01 reported to [facility] [facility].

## 2014-08-18 NOTE — ED Notes (Signed)
LSN: 45 mins ago; slurred speech.

## 2014-08-18 NOTE — Telephone Encounter (Signed)
Pt went to ER after calling office. Nothing further is needed at this time.

## 2014-08-22 ENCOUNTER — Telehealth: Payer: Self-pay | Admitting: Pulmonary Disease

## 2014-08-22 NOTE — Telephone Encounter (Signed)
Spoke with patient - states that BP is still running high Seen in ED 08/18/14 as advised by Dr Lenna Gilford Glean Hess, CMA at 08/18/2014 10:13 AM     Status: Signed       Expand All Collapse All   Patient says that he is having problems with his BP again. Patient had TIA 2 weeks ago. Patient's BP was 211/115 and last night. Heavy feeling in head, patient was slurring speech on telephone. Advised patient to call ambulance to take him to ER.   FYI to Dr. Lenna Gilford.       Pt states that BP is still running 210/110 range and states that he feels his medications are still not right.  Pt states that when he was in ED they did not make any changes to his medications while there, once his BP came down to a safe range they let discharged him home.  Pt is wanting to know if he can come in sometime this week to discuss changing meds -- pt scheduled for OV with SN 08/23/14 at 12pm Please advise Dr Lenna Gilford if you wish to keep this appt or make changes via telephone. Thanks.     Medication List       This list is accurate as of: 08/22/14 10:56 AM.  Always use your most recent med list.               ALIGN 4 MG Caps  Take 1 capsule by mouth daily.     aspirin EC 81 MG tablet  Take 1 tablet (81 mg total) by mouth daily.     atorvastatin 20 MG tablet  Commonly known as:  LIPITOR  Take 1 tablet (20 mg total) by mouth daily.     carbidopa-levodopa 25-100 MG per tablet  Commonly known as:  SINEMET IR  Take 1 tablet by mouth 4 (four) times daily. At 8, 12, 5 PM, and 9 or 10 PM     levothyroxine 100 MCG tablet  Commonly known as:  SYNTHROID  Take 1 tablet (100 mcg total) by mouth daily before breakfast.     losartan-hydrochlorothiazide 100-12.5 MG per tablet  Commonly known as:  HYZAAR  Take 1 tablet by mouth daily.     multivitamin with minerals Tabs tablet  Take 1 tablet by mouth daily.     rasagiline 1 MG Tabs tablet  Commonly known as:  AZILECT  Take 1 tablet (1 mg total) by mouth daily.      Rivaroxaban 15 MG Tabs tablet  Commonly known as:  XARELTO  Take 1 tablet (15 mg total) by mouth daily with supper.     tamsulosin 0.4 MG Caps capsule  Commonly known as:  FLOMAX  Take 1 capsule (0.4 mg total) by mouth daily.

## 2014-08-23 ENCOUNTER — Ambulatory Visit (INDEPENDENT_AMBULATORY_CARE_PROVIDER_SITE_OTHER): Payer: Medicare HMO | Admitting: Pulmonary Disease

## 2014-08-23 ENCOUNTER — Encounter: Payer: Self-pay | Admitting: Pulmonary Disease

## 2014-08-23 ENCOUNTER — Other Ambulatory Visit (INDEPENDENT_AMBULATORY_CARE_PROVIDER_SITE_OTHER): Payer: Medicare HMO

## 2014-08-23 VITALS — BP 148/80 | HR 56 | Temp 97.4°F | Wt 180.4 lb

## 2014-08-23 DIAGNOSIS — G459 Transient cerebral ischemic attack, unspecified: Secondary | ICD-10-CM

## 2014-08-23 DIAGNOSIS — C73 Malignant neoplasm of thyroid gland: Secondary | ICD-10-CM

## 2014-08-23 DIAGNOSIS — G20 Parkinson's disease: Secondary | ICD-10-CM

## 2014-08-23 DIAGNOSIS — I480 Paroxysmal atrial fibrillation: Secondary | ICD-10-CM | POA: Diagnosis not present

## 2014-08-23 DIAGNOSIS — I10 Essential (primary) hypertension: Secondary | ICD-10-CM

## 2014-08-23 DIAGNOSIS — E890 Postprocedural hypothyroidism: Secondary | ICD-10-CM

## 2014-08-23 DIAGNOSIS — I7091 Generalized atherosclerosis: Secondary | ICD-10-CM | POA: Diagnosis not present

## 2014-08-23 LAB — TSH: TSH: 2.94 u[IU]/mL (ref 0.35–4.50)

## 2014-08-23 MED ORDER — HYDRALAZINE HCL 10 MG PO TABS: 20.0000 mg | ORAL_TABLET | Freq: Three times a day (TID) | ORAL | Status: DC

## 2014-08-23 MED ORDER — HYDRALAZINE HCL 25 MG PO TABS: 25.0000 mg | ORAL_TABLET | Freq: Three times a day (TID) | ORAL | Status: DC

## 2014-08-23 NOTE — Telephone Encounter (Signed)
Spoke with pt on 08/22/14 at 5pm. Pt stated that he was feeling better and BP had returned to normal. Pt informed to keep his appt with SN on 08/23/14 at 12pm to discuss BP issues. Pt stated that he would come to appt. Nothing further is needed at this time.

## 2014-08-23 NOTE — Progress Notes (Addendum)
Subjective:    Patient ID: William [name], male    DOB: [birth year], 81 y.o.   MRN: [id number]  HPI 81 y/o WM here for a follow up visit... he has multiple medical problems as noted below... followed for HBP, Carotid vasc dis w/ hx of TIA & prev subdural hematoma, Chol, & prev surg for medullary thyroid carcinoma... his wife passed away from metastatic breast cancer 11/09... 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  LABS 1/14 preop in EPIC> Chems- ok & Cr=1.3-1.5;  CBC- ok  LABS 8/14:  FLP- at goals on Simva20;  Chems- ok x Cr=1.5;  CBC- wnl;  TSH=0.54 & FreeT4=1.28 (0.6-1.60)...  ~  June 16, 2013:  71mo ROV & [name] describes some stress w/ girlfriends LBP/ [id number]/ complications... No new complaints or concerns... We reviewed the following medical problems during today's office visit >>     HBP> on Hyzaar100-12.5 & Cardura8-1/2; BP=120/58 today & denies CP, palpit, SOB, edema...    ASPVD> on ASA 81mg  daily w/o cerebral ischemic symptoms etc; he is off Plavix ever since his SDH...    Chol> on Simva40-1/2, Fish Oil & diet rx; FLP 8/14 shows TChol 128, TG 74, HDL 48, LDL 66    Hx medullary thyroid cancer> on Synthroid200-taking 1/2 alt w/1 Qod; followed by [facility] for [facility]; TFTs remain wnl w/ TSH 8/14 = 0.54 & FreeT4= 1.28 (0.6-1.60)    GI- Divertics, HxPolyps> last colon 2007 by DrSamLeB showed divertics, hems, no polyps & f/u suggested 50yrs but now>82y/o...    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    DJD, LBP> on Vicodin prn; he developed LBP after lifting a mattress 5/13; eval by DrBrooks & DrHirsh w/ MRI reported to show HNP at L4-5 (w/ myelopathy & radiculopathy); DrHirsh did LumbarLam 1/14 w/ decompression L4 & L5 w/ diskectomy=> improved...    Neuro- TIA, Hx subdural hemtoma, Tremor/Parkinson's> now followed by DrAthar for Neuro on Sinemet25-100Tid & Azilect1mg - he reports doing better overall...    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 We reviewed prob list, meds, xrays and labs> see below for updates >> Rx for shingles vax...  CT Abd showed further contraction of the cryoablation zone within the lower part of the right kidney, no enhancement, bilat renal cysts, chr right UPJ stenosis; Incidentally noted [place], mult splenic granuloma, diffuse Ao atherosclerosis & dilatation of infrarenal Ao to 3.3cm, DJD in lumbar spine...   ~  December 21, 2013:  39mo ROV & [name] describes a good interval, his stress is diminished & he's planning a river cruise;  We reviewed the following medical problems during today's office visit >>     HBP> on Hyzaar100-12.5 & Cardura8-1/2; BP=110/70 today & denies CP, palpit, SOB, edema...    ASPVD> on ASA 81mg  daily w/o cerebral ischemic  symptoms etc; he is off Plavix ever since his SDH...    Chol> on Simva40-1/2, Fish Oil & diet rx; FLP 9/15 shows TChol 131, TG 72, HDL 50, LDL 67    Hx medullary thyroid cancer> on Synthroid150; followed by [facility] for [facility]; TFTs remain wnl w/ TSH 9/15 = 0.54 & FreeT3=3.0 (2.3-4.2) & FreeT4= 1.31 (0.6-1.60)    GI- Divertics, HxPolyps> on Align; last colon 2007 by DrSamLeB showed divertics, hems, no polyps & f/u suggested 7yrs but now>82y/o...    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    DJD, LBP> off Vicodin; he developed LBP after lifting a mattress 5/13; eval by DrBrooks & DrHirsh w/ MRI reported to show HNP at L4-5 (w/ myelopathy & radiculopathy); DrHirsh did LumbarLam 1/14 w/ decompression L4 & L5 w/ diskectomy=> improved...    Neuro- TIA, Hx subdural hemtoma, Tremor/Parkinson's> now followed by DrAthar for Neuro on Sinemet25-100Tid & Azilect1mg - he reports doing better overall...    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 We reviewed prob list, meds, xrays and labs> see below for updates >> OK Flu shot today...  LABS 9/15:  FLP- at goals on Simva20;  Chems- ok x Cr=1.4;  CBC- wnl;  TSH=0.54;  FreeT3 & FreeT4- wnl...  ~  April 26, 2014:  40mo ROV & add-on appt requested for HBP> he has been well controlled on Cardura8- 1/2 daily and Hyzaar100-12.5; over the last week his home BP checks were in the 200/100 range and [name] notes that "he feels it"; pt states that his head feels heavy- denies CP, palpit, SOB, edema; I note that wt is up 7# to 186# today, he denies eating salt; BP here today= 132/84 & they didn't bring his cuff; we decided to check CXR (norm heart size, NAD) and BMet (ok x HCO3=36, Cr=1.4); we decided to incr Cardura to 8mg /d and f/u 49mo; get on diet & gety wt back down; he will call in the interim for concerns...    We reviewed prob list, meds, xrays and labs> see below for updates >>   CXR 1/16 showed norm heart size, clear hyperinflated lungs, stable mediastinal calcif, NAD...  LABS 1/16:  Chems- ok x HCO3=36 Cr=1.4.Marland KitchenMarland Kitchen PLAN>>  We decided to incr his Cardura to 8mg /d; he will continue the Hyzaar & monitor BP at home, ROV 23mo, call for questions...   ~  July 22, 2014:  44mo ROV & post hospital check> [name] was Adm 4/3 - 07/18/14 by [facility] w/ AFib & RVR, after waking up weak,  felt "funny", BP was low; In ER found AFib/ rvr, no CP, & he converted to NSR w/ Cardizem drip; CHA2DS2-VASc score= 5 for age>75, hx of TIA, male, HBP & started on Xarelto15/d (due to hx subdural in past)... Note: mult other changes to is meds during this brief hosp> he goes to the [facility] for his med refills...    HBP> on Losar25 & Cardizem60Bid; BP=120/70 today & denies CP, palpit, SOB, edema... Continue these new meds...    PAF> adm 4/16 w/ PAF &rvr; converted spont to NSR & meds adjusted, disch on Xarelto15/d...    ASPVD> off ASA 81 now since Xarelto started; denies cerebral ischemic symptoms etc; IR found ~4cm AAA on  f/u CTAbd 4/16 & he has appt w/ DrBrabham...Marland KitchenMarland KitchenMarland Kitchen    Chol> on Lip20 now, Fish Oil & diet rx; FLP 9/15 (on Simva20) shows TChol 131, TG 72, HDL 50, LDL 67    Hx medullary thyroid cancer> on Synthroid100 now; followed by [facility] for CCS; TFTs were wnl on Levothy150 w/ TSH 9/15 = 0.54 & FreeT3=3.0 (2.3-4.2) & FreeT4= 1.31 (0.6-1.60); dose decr to 121mcg/d by Cards due to PAF 4/16.    GI- Divertics, HxPolyps> on Align; last colon 2007 by DrSamLeB showed divertics, hems, no polyps & f/u suggested 49yrs but now >82y/o...    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    DJD, LBP> off Vicodin, on Tylenol; he developed LBP after lifting a mattress 5/13; eval by DrBrooks & DrHirsh w/ MRI reported to show HNP at L4-5 (w/ myelopathy & radiculopathy); DrHirsh did LumbarLam 1/14 w/ decompression L4 & L5 w/ diskectomy=> improved...    Neuro- TIA, Hx subdural hemtoma, Tremor/Parkinson's> now followed by DrAthar for Neuro on Sinemet25-100Qid & Azilect1mg /d- seen  04/2014 & he reports stable...    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  CXR 4/16 showed norm heart size, clear lungs, mult calcif mediastinal LNs (old gran dis), NAD...   2DEcho 4/16 showed mild LVH, norm LVF=60-65% & no regional wall motion abn, Gr1DD, valves OK, LA=18mm, mod RV dil...   LABS 4/16:  Reviewed- Cr=1.3-1.6, sl elev troponins, TSH=0.532...  IMP/ PLAN>>  Brief episode of PAF, converted spont to NSR, started on Xarelto15 for elev risk (but has hx SDH as well) and mult other meds adjusted as noted;  He is feeling better overall & awaiting f/u appt w/ Cards- DrHochrein;  He had f/u [facility]Abd by [facility] & his cryoablation zone lower pole right kidney is further sl improved but there was a 4cm AAA & he has appt w/ DrBrabham to follow this... He will need f/u FLP, BMet, & TSH on return...  ~  Aug 23, 2014:  79mo ROV & add-on for HBP & to review recent events> [name] was [facility] 4/26 - 08/10/14 by [facility] w/ TIA after presenting w/ transient aphasia & confusion; he was already on Xarelto15 (due to PAF w/ CHADS-VASc=5 but prev hx subdural hematoma); CT Brain w/o acute changes (mild atrophy & sm vessel dis), subseq MRI/MRA was also neg w/o infarct or large vessel occlusion or stenosis (prev left craniotomy, right P2/3 mid grade stenosis); seen by Neurology & they rec adding ASA81/d;  He was also bradycardic & his Cardizem was stopped & ultimately Cozaar dose increased...  He had Cards f/u DrHochrein 4/29 & returned to the ER 5/5 w/ elevBP and ?slurring (vs his regular parkinson's voice)=>  His Xarelto & ASA were continued and Cozaar changed to Hyzaar100-12.5.Marland KitchenMarland Kitchen    HBP> on Hyzaar100-12.5 & off Cardizem; BP=180/100 today & later down to 148/80; denies CP, palpit, SOB, edema; BP at home >200 & we decided to add HYDRALAZINE 25Tid.    PAF> adm 4/16 w/ PAF &rvr; converted spont to NSR & meds adjusted, disch on Xarelto15/d, holding  NSR...    ASPVD>  back on ASA 81 & Xarelto since his TIA 4/16; denies further cerebral ischemic symptoms etc; IR found ~4cm AAA on f/u CTAbd 4/16 & he has appt w/ DrBrabham......    Neuro- TIA, Hx subdural hemtoma, Tremor/Parkinson's> now followed by DrAthar for Neuro on Sinemet25-100Qid but they stopped Azilect1mg /d since [place] read that it can cause elevBP...    Hx medullary thyroid cancer> on Synthroid100 now; followed by [facility] for CCS; TFTs were wnl on Levothy150 w/ TSH 9/15 = 0.54 & FreeT3=3.0 (2.3-4.2) & FreeT4= 1.31 (0.6-1.60); dose decr to 153mcg/d by Cards due to PAF-4/16 We reviewed prob list, meds, xrays and labs> see below for updates >>   LABS 5/16:  TSH=2.94 PLAN>>  [name] will continue the Hyzaar100-12.5 7 add Hydralazine25tid while watching his BP;  They stopped his Azilect since [place] read that prolonged use can cause incrBP- he is asked to check w/ Neuro;  ROV 31mo to f/u BP on meds... TSH is wnl on Synthroid100- continue same.           Problem List:  HYPERTENSION (ICD-401.9) - controlled on HYZAAR 100/25 daily, & CARDURA 8mg - taking 1/2 daily... ~  2/13:  BP= 122/60 & he denies CP, palpit, SOB, edema, etc... ~  8/13:  BP= 140/80 & he remains largely asymptomatic... ~  CXR 1/14 showed normal heart size, ectatic & calcif Ao, low lying diaph, clear lungs, NAD... ~  EKG 1/14 showed NSR, rate61,  Poor R prog V1-3, NSSTTWA... ~  2/14: on Hyzaar100-12.5 & Cardura8; BP=138/80 today & denies CP, palpit, SOB, edema. ~  3/15: on Hyzaar100-12.5 & Cardura8-1/2; BP=120/58 today & he remains asymptomatic... ~  9/15: on Hyzaar100-12.5 & Cardura8-1/2; BP=110/70 today & denies CP, palpit, SOB, edema... ~  1/16: he presented w/ BP up to 200/100 at home; it was 132/84 here & we decided to incr Cardura to 8mg /d, recheck 87mo... ~  CXR 1/16 showed norm heart size, clear hyperinflated lungs, stable mediastinal calcif, NAD ~  4/16:  He was hosp this month w/ PAF> now on [place];  BP=120/70 today & denies CP, palpit, SOB, edema; subseq Cardizem stopped due to bradycardia & Losar incr to 50mg /d; then this was changed to Hyzaar100-12.5 ~  5/16:  BP still elev at home & office on Hyzaar100-12.5 now; BP= 180/100 & we decided to add HYDRALAZINE 25Tid...   PAF >> he was adm 4/3-07/2014 w/ PAF & rvr, converted w/ Cardizem drip & meds adjusted- disch on Xarelto15 & Cardizem60Bid, holding NSR & doing satis...  ATHEROSCLEROTIC VASCULAR DISEASE (ICD-440.9) - on ASA 81mg /d;  Prev Plavix was discontinued after his subdural hematoma in 2006. ~  Eval 2003 by [facility] for TIA showed cerebrovasc dis w/ R>L PCA stenoses... ~  4/15: f/u CT Abd (f/u cryoablation of renal oncocytoma) also showed 3.3cm AAA being followed... ~  4/16: f/u CT Abd by IR, DrYamagata showed further contraction of the cryoablation defect w/ other changes persisting & AAA now 3.9cm in size=> he's been referred to DrBrabham to establish care...  HYPERLIPIDEMIA (ICD-272.4) - on SIMVASTATIN 40mg -taking 1/2 + FISH OIL 1000mg /d> doing well. ~  FLP 12/07 on Zocor showed TChol 137, HDL 47, LDL 70 ~  FLP 7/09 off med showed TChol 196, TG 95, HDL 49, LDL 128... rec> restart Zocor 40mg . ~  FLP 7/10 showed TChol 135, TG 47, HDL 53, LDL 73... He decr dose to 1/2 tab on his own... ~  [place] 8/11 showed TChol 138, TG 73, HDL 49, LDL 75 ~  FLP  8/12 showed TChol 125, TG 69, HDL 56, LDL 56 ~  FLP 2/13 showed TChol 133, TG 69, HDL 61, LDL 59 ~  FLP 8/13 showed TChol 137, TG 73, HDL 53, LDL 70 ~  FLP 8/14 on Simva20 showed  TChol 128, TG 74, HDL 48, LDL 66 ~  FLP 9/15 on Simva20 showed TChol 131, TG 72, HDL 50, LDL 67  ~  4/16:  Now on Atorva20...  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 ~  labs 7/09 showed TSH= 3.47... on SYNTHROID 212mcg/d ~  labs 7/10 showed TSH= 2.95 ~  labs 8/11 showed TSH= 1.20 ~  Labs 8/12 showed TSH= 0.23 & pt rec to decr  the Synthroid200 to only 1/2 on MWF.Marland Kitchen. ~  Labs 2/13 showed TSH= 2.29 on Synthroid200- taking 1x4d=TThSS and 1/2 x3d=MWF.Marland Kitchen. ~  Labs 8/13 showed TSH= 0.69... On same dose. ~  Labs 8/14 on Synthroid200-taking 1 alt w/ 1/2 qod showed TSH=0.54, Free T4=1.28 ~  3/15: we discussed change in Synthroid to 124mcg/d... ~  Labs 9/15 on Synthroid150 showed TSH = 0.54 & FreeT3=3.0 (2.3-4.2) & FreeT4= 1.31 (0.6-1.60) ~  4/16:  He is now on Synthroid 143mcg/d since hosp for PAF 4/16... TSH on Levothy100= 2.94, continue same.  COLONIC POLYPS (ICD-211.3) - last colonoscopy 1/07 by DrSam showed divertics & hems only... f/u planned 62yrs.  BENIGN PROSTATIC HYPERTROPHY, WITH OBSTRUCTION (ICD-600.01) - he sees DrDavis for Urology once a year... he has a brother who had prostate cancer...  ~  PSA followed by HWEXHBZJ yrearly... 7/10 pt reports PSA>5 (was 4.17 here) & Rx w/ Cipro... ~  he had f/u Urology eval DrGrapey 5/11- pt indicates that his PSA was OK... 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 ~  2/14:  followed by DrGrapey & DrYamagata re-imaged the lesion via CT 1/14 w/ growth evident & he has rec Cryoablation=> pending due to need for LLam first... ~  3/14:  he underwent cryoablation of right renal oncocytic tumor by DrYamagata (pt reports painful  procedure) ~  5/14:  f/u visit w/ IR & CT Abd=> 2 left renal cysts & lower pole right renal cyst, cryoablation zone in inferior pole right kidney measures 4x3cm; atherosclerotic changes in Ao, mult splenic granulomata, prom CBD... ~  9/14: f/u visit w/ IR, DrYamagata> s/p percut cryoablation of right renal oncocytic tumor 3/14; doing well, CTshowed expected post ablation changes, no resid or recurrent tumor suspected...  ~  1/15: f/u visit w/ DrGrapey> doing well from the renal tumor standpoint, BPH, LTOS, ED, etc... ~  4/14: he had f/u DrYamagata> doing well, asymptomatic- BUN=17, Cr=1.21, f/u CT showed further retraction of right sided cryoablation defect, no abnormal enhancement, bilat renal cysts... ~  4/15: he had yearly f/u w/ DrYamagata> CT Abd showed further contraction of the cryoablation zone in the lower pole of the right kidney, no recurrent tumor, stable bilat renal cysts, chr right UPJ stenosis, 3.3cm AAA noted... ~  4/16: he had yearly f/u by IR> CT Abd showed post ablation changes w/ contraction of the cryoablation zone in lower pole of right kidney & no enhancement, mild infrarenal AAA ~4cm & he has appt w/ DrBrabham soon                TRANSIENT ISCHEMIC ATTACK (ICD-435.9) - hx of recurrent right brain TIA's in 2003... initially on ASA + Plavix and the Plavix was stopped after his subdural in 2006... ~  [facility] 4/16 by [facility] w/ TIA> presented w/ aphasia & confusion=> rapidly cleared; Neuro rec adding back ASA81 to his [place]...  Hx of SUBDURAL HEMATOMA (ICD-432.1) - hosp 7/06 w/ left subdural hematoma req craniotomy by DrHirsh for evacuation... no known trauma- he was on ASA/ Plavix and developed a headache... he also take DILANTIN 100mg  Tid now per [facility] (for a partial seizure characterized by aphasia)... ~  7/10: pt tells me he has decreased his Dilantin to Bid and will f/u w/ DrLove. ~  8/11:  we don't have any recent notes from [facility], [facility], [facility], but pt is off the Dilantin rx.  TREMOR  (ICD-781.0) - he notes mild tremor right hand & arm> eval from Neurology- Gean Quint- indicated prob early [name] and after a period of observation they started Mirapex (stopped due to hypotension), changed to [facility] 2/12> dosed per DrLove. ~  8/11: pt tells me that he has started accupuncture treatments in [place]==> no benefit. ~  2/12:  pt indicates that DrLove stopped the Mirapex in favor of SINEMET 25/100- taking 1/2 Tid. ~  8/12:  Pt tells me he has seen DrScott, Neurology at [facility] (?referred by [facility], ?second opinion), we don't have records, pt reports no change in meds (he considered entering a drug study). ~  He continues to f/u w/ DrLove every 4-32months... ~  8/13:  He indicates that Drlove recently increased the SINEMET 25/100 to 1 tab Tid... ~  8/14:  He had Neuro f/u DrAthar> note reviewed & pt stable on Sinemet Tid + Azilect 1mg /d (added 5/14 by Neuro)...  DEGENERATIVE JOINT DISEASE (ICD-715.90) - he notes some knee discomfort & he takes Aleve & HYDROCODONE as needed... LBP >> injured back lifting mattress 5/13; eval by DrBrooks & DrHirsh w/ abn MRI (done by Ortho at their office) & disc herniation L4-5 by report; Pred w/o help, ESI helped some, using pain Rx & holding off on surg per DrHirsh... ~  1/14: on Vicodin prn; eval by DrBrooks & DrHirsh w/ MRI reported to show HNP at L4-5 (w/ myelopathy & radiculopathy); DrHirsh did LumbarLam 1/14 w/ decompression L4 & L5 w/ discectomy=> improved...  Hx of CARCINOMA, SKIN, SQUAMOUS CELL (ICD-173.9) - s/p removal of skin cancer from his right forearm 11/06 by DrLupton... ~  9/15: he reports another skin cancer removed...  Health Maintenance:  he had PNEUMOVAX vaccine in 1998 & 1/11... gets yearly Flu vaccine every fall... TDAP given 8/11...   Past Surgical History  Procedure Laterality Date  . Cataract extraction w/ intraocular lens  implant, bilateral Bilateral ~ 1998  . Cholecystectomy    . Thyroidectomy      medullary carcinoma   . Eye surgery    . Knee arthroscopy Right   . Appendectomy    . Tonsillectomy    .  Lumbar laminectomy/decompression microdiscectomy  05/14/2012    Procedure: LUMBAR LAMINECTOMY/DECOMPRESSION MICRODISCECTOMY 1 LEVEL;  Surgeon: [name], MD;  Location: [place] [facility]S;  Service: Neurosurgery;  Laterality: Left;  Left Lumbar four-five Laminectomy/Diskectomy/Far lateral diskectomy  . Cryoablation  07-10-12    "tumor on cyst on my kidney; had an ablation"  . Skin cancer excision  "several"    "back of my neck; right eye; clavicle; nose"  . Inguinal hernia repair Right   . Back surgery    . Subdural hematoma evacuation via craniotomy  2006    Outpatient Encounter Prescriptions as of 08/23/2014  Medication Sig  . aspirin EC 81 MG tablet Take 1 tablet (81 mg total) by mouth daily.  Marland Kitchen atorvastatin (LIPITOR) 20 MG tablet Take 1 tablet (20 mg total) by mouth daily.  . carbidopa-levodopa (SINEMET IR) 25-100 MG per tablet Take 1 tablet by mouth 4 (four) times daily. At 8, 12, 5 PM, and 9 or 10 PM  . levothyroxine (SYNTHROID) 100 MCG tablet Take 1 tablet (100 mcg total) by mouth daily before breakfast.  . losartan-hydrochlorothiazide (HYZAAR) 100-12.5 MG per tablet Take 1 tablet by mouth daily.  . Multiple Vitamin (MULTIVITAMIN WITH MINERALS) TABS Take 1 tablet by mouth daily.  . Probiotic Product (ALIGN) 4 MG CAPS Take 1 capsule by mouth daily.  . Rivaroxaban (XARELTO) 15 MG TABS tablet Take 1 tablet (15 mg total) by mouth daily with supper.  . tamsulosin (FLOMAX) 0.4 MG CAPS capsule Take 1 capsule (0.4 mg total) by mouth daily.  . rasagiline (AZILECT) 1 MG TABS tablet Take 1 tablet (1 mg total) by mouth daily. (Patient not taking: Reported on 08/23/2014)    No Known Allergies   Current Medications, Allergies, Past Medical History, Past Surgical History, Family History, and Social History were reviewed in [employer] record.    Review of Systems       See HPI - all  other systems neg except as noted...       The patient complains of dyspnea on exertion, right hand tremor, and difficulty walking.  The patient denies anorexia, fever, weight loss, weight gain, vision loss, decreased hearing, hoarseness, chest pain, syncope, peripheral edema, prolonged cough, headaches, hemoptysis, abdominal pain, melena, hematochezia, severe indigestion/heartburn, hematuria, incontinence, muscle weakness, suspicious skin lesions, transient blindness, depression, unusual weight change, abnormal bleeding, enlarged lymph nodes, and angioedema.     Objective:   Physical Exam     WD, WN, 81 y/o WM in NAD... GENERAL:  Alert & oriented; pleasant & cooperative... HEENT:  [place]/AT, EOM-wnl, PERRLA, EACs-clear, TMs-wnl, NOSE-clear, THROAT-clear & wnl. NECK:  Supple w/ fairROM; no JVD; normal carotid impulses w/o bruits; thyroid area scar, no nodules palp; no lymphadenopathy. CHEST:  Clear to P & A; without wheezes/ rales/ or rhonchi heard... HEART:  Regular Rhythm; without murmurs/ rubs/ or gallops detected... ABDOMEN:  Soft & nontender; normal bowel sounds; no organomegaly or masses palpated... BACK:  Scar of LLam surg... EXT: without deformities, mild arthritic changes; no varicose veins/ +venous insuffic/ no edema. NEURO:  CN's intact;  mod tremor right hand & arm at rest, ?early mask facies, still moves well. DERM:  No lesions noted; no rash etc...  RADIOLOGY DATA:  Reviewed in the EPIC EMR & discussed w/ the patient...  LABORATORY DATA:  Reviewed in the EPIC EMR & discussed w/ the patient...   Assessment & Plan:    PAF>> Brief episode of PAF, converted spont to NSR, started on Xarelto15 for elev risk (  but has hx SDH as well) and mult other meds adjusted as noted;  He is feeling better overall & awaiting f/u appt w/ Cards- DrHochrein;  He had f/u [facility]Abd [facility] & his cryoablation zone lower pole right kidney is further sl improved but there was a 4cm AAA & he has appt w/  DrBrabham to follow this...  HBP>  Volatile BP changes=> Losartan increased, then changed to Hyzaar; now adding HYDRALAZINE 25Tid & continue to monitor BP at home.   PVD- carotids and AAA>  Back on ASA now & Xarelto15- w/o cerebral ischemic symptoms, & he has appt w/ DrBrabham...  HYPERLIPID>  On Atorva20 now + Fish Oil; FLP was good 9/15 on Simva20 at that time...  Hx Medullary Thyroid Carcinoma>  S/p surg by DrNewman2002, on Synthroid 100mg /d now & TSH = 2.94, continue same.  GI> remote hx polyp, divertics, hems>  Last colon 2007 by by DrSam & suggested f/u 80yrs...  GU>  Followed by DrGrapey now, RENAL LESION found incidentally on MRI Lumbar spine & w/u revealed oncocytoma w/ cryoablation per [facility] 3/14 & stable on f/u visits/ scans...  NEURO> Hx TIA, Hx SDH, Tremor/ Parkinson's>  Followed by Gean Quint & now DrAthar on Sinemet & Azilect, improved...  DJD, LBP>  He had decompressive LLam 1/14 by DrHirsh & is recovering nicely...  Other medical issues as noted...   Patient's Medications  New Prescriptions   HYDRALAZINE (APRESOLINE) 25 MG TABLET    Take 1 tablet (25 mg total) by mouth 3 (three) times daily.  Previous Medications   ASPIRIN EC 81 MG TABLET    Take 1 tablet (81 mg total) by mouth daily.   ATORVASTATIN (LIPITOR) 20 MG TABLET    Take 1 tablet (20 mg total) by mouth daily.   CARBIDOPA-LEVODOPA (SINEMET IR) 25-100 MG PER TABLET    Take 1 tablet by mouth 4 (four) times daily. At 8, 12, 5 PM, and 9 or 10 PM   LEVOTHYROXINE (SYNTHROID) 100 MCG TABLET    Take 1 tablet (100 mcg total) by mouth daily before breakfast.   LOSARTAN-HYDROCHLOROTHIAZIDE (HYZAAR) 100-12.5 MG PER TABLET    Take 1 tablet by mouth daily.   MULTIPLE VITAMIN (MULTIVITAMIN WITH MINERALS) TABS    Take 1 tablet by mouth daily.   PROBIOTIC PRODUCT (ALIGN) 4 MG CAPS    Take 1 capsule by mouth daily.   RASAGILINE (AZILECT) 1 MG TABS TABLET    Take 1 tablet (1 mg total) by mouth daily.   RIVAROXABAN (XARELTO) 15  MG TABS TABLET    Take 1 tablet (15 mg total) by mouth daily with supper.   TAMSULOSIN (FLOMAX) 0.4 MG CAPS CAPSULE    Take 1 capsule (0.4 mg total) by mouth daily.  Modified Medications   No medications on file  Discontinued Medications   No medications on file

## 2014-08-23 NOTE — Patient Instructions (Signed)
Today we updated your med list in our EPIC system...    Continue your current medications the same...  We decided to add another BP med>>    Start HYDRALAZINE (Apresoline) 25mg  tabs- one tab 3 times daily...  Today we rechecked your Thyroid blood test on the 179mcg/d dose...    We will contact you w/ the results when available...   Continue to monitor your BP & call for aany questions...   's plan a follow up visit in 1 month or so.Marland KitchenMarland Kitchen

## 2014-08-26 ENCOUNTER — Telehealth: Payer: Self-pay | Admitting: Pulmonary Disease

## 2014-08-26 ENCOUNTER — Encounter: Payer: Self-pay | Admitting: Surgery

## 2014-08-26 NOTE — Telephone Encounter (Signed)
Per SN, pt is suppose to be on hydrazlazine 25mg  TID as written on the AVS. Called and spoke with pt directly and reassured he was was taking the correct amount. Pt stated he appreciated the call and that he just wanted to make sure he was taking the correct dose. Pt stated during the conversation that his BP was stable and he was feeling much better. Nothing further is needed.

## 2014-08-26 NOTE — Telephone Encounter (Signed)
Spoke with pt's wife.  Pt was seen on 08/23/14 by SN.  Hydralazine was started.  Wife reports they were both advised this would be hydralazine 10 mg and pt was to take 2 tablets tid.  Reports this is also listed on the "pt med list" given after visit.  However, they were given hydralazine 25 mg take 1 tablet tid and see the 25 mg tid instructions under the "pt instruction" section of the AVS.  Per med hx, hydralazine 10 mg take 2 tablets tid was rx'd but then was d/c'd as "not available" and hydralazine 25 mg take 1 tid rx was then sent to pharm.  Wife concerned; wants to confirm dosage change.  SN, please advise.  Thank you.

## 2014-08-29 ENCOUNTER — Ambulatory Visit (INDEPENDENT_AMBULATORY_CARE_PROVIDER_SITE_OTHER): Payer: Medicare HMO | Admitting: Surgery

## 2014-08-29 ENCOUNTER — Encounter: Payer: Self-pay | Admitting: Surgery

## 2014-08-29 VITALS — BP 145/76 | HR 66 | Ht 76.0 in | Wt 180.2 lb

## 2014-08-29 DIAGNOSIS — I714 Abdominal aortic aneurysm, without rupture, unspecified: Secondary | ICD-10-CM

## 2014-08-29 NOTE — Progress Notes (Signed)
HISTORY AND PHYSICAL     CC:  AAA Referring Provider:  [name], MD  HPI: This is a 81 y.o. male who has been treated by Dr. 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   His CTA for follow up revealed a 3.9cm AAA.  In the past month, he was in the ER for atrial fibrillation.  His HTN medications have been tweaked for better control.  He is going to f/u with Dr. Percival Spanish for this.  He also has [name] and he states that he was on a medication for this that could cause hypertension over time.  He has discontinued this medication ~ 1 week ago and he states his blood pressure has been under better control.   Also in the past month, he had a TIA where he states he was reading an article on the computer and couldn't comprehend what he was reading.  When he was trying to speak, he was unable to and 911 was called.  By the time the responder arrived, his symptoms had resolved.   He is on a statin for his cholesterol.  He is on an ARB for his HTN.  He is also on an aspirin.  He does take Xarelto due to his Afib.    Past Medical History  Diagnosis Date  . Hyperlipidemia   . Hx of colonic polyps   . Benign prostatic hypertrophy with urinary obstruction   . TIA (transient ischemic attack)     pt does not recall this hx "but may have had one today" (08/09/2014)  . Subdural hematoma 2006  . Gout   . Hypertension     sees [name]  . Thyroid disease     medullary thyroid   . Pneumonia     hx of in 1952  . Parkinson disease   . A-fib   . Complication of anesthesia     "difficulty intubation, had to use fiberoptic 2006"  . Difficult intubation   . Malignant neoplasm of thyroid gland     medullary carcinoma  . Primary skin squamous cell carcinoma   . Basal cell carcinoma   . Hypothyroidism   . DJD (degenerative joint disease)   . Arthritis     "right knee" (08/09/2014)  . Gout     "periodically" (08/09/2014)  . Kidney tumor     "tumor on  cyst on kidney"   . DJD (degenerative joint disease)   . Parkinson disease     Past Surgical History  Procedure Laterality Date  . Cataract extraction w/ intraocular lens  implant, bilateral Bilateral ~ 1998  . Cholecystectomy    . Thyroidectomy      medullary carcinoma  . Eye surgery    . Knee arthroscopy Right   . Appendectomy    . Tonsillectomy    . Lumbar laminectomy/decompression microdiscectomy  05/14/2012    Procedure: LUMBAR LAMINECTOMY/DECOMPRESSION MICRODISCECTOMY 1 LEVEL;  Surgeon: [name], MD;  Location: [facility];  Service: Neurosurgery;  Laterality: Left;  Left Lumbar four-five Laminectomy/Diskectomy/Far lateral diskectomy  . Cryoablation  07-10-12    "tumor on cyst on my kidney; had an ablation"  . Skin cancer excision  "several"    "back of my neck; right eye; clavicle; nose"  . Inguinal hernia repair Right   . Back surgery    . Subdural hematoma evacuation via craniotomy  2006  . Kidney surgery      No Known Allergies  Current  Outpatient Prescriptions  Medication Sig Dispense Refill  . aspirin EC 81 MG tablet Take 1 tablet (81 mg total) by mouth daily. 30 tablet 0  . atorvastatin (LIPITOR) 20 MG tablet Take 1 tablet (20 mg total) by mouth daily. 30 tablet 11  . carbidopa-levodopa (SINEMET IR) 25-100 MG per tablet Take 1 tablet by mouth 4 (four) times daily. At 8, 12, 5 PM, and 9 or 10 PM 360 tablet 3  . hydrALAZINE (APRESOLINE) 25 MG tablet Take 1 tablet (25 mg total) by mouth 3 (three) times daily. 90 tablet 3  . levothyroxine (SYNTHROID) 100 MCG tablet Take 1 tablet (100 mcg total) by mouth daily before breakfast. 90 tablet 2  . losartan-hydrochlorothiazide (HYZAAR) 100-12.5 MG per tablet Take 1 tablet by mouth daily. 30 tablet 0  . Multiple Vitamin (MULTIVITAMIN WITH MINERALS) TABS Take 1 tablet by mouth daily.    . Probiotic Product (ALIGN) 4 MG CAPS Take 1 capsule by mouth daily.    . Rivaroxaban (XARELTO) 15 MG TABS tablet Take 1 tablet (15 mg  total) by mouth daily with supper. 30 tablet 11  . tamsulosin (FLOMAX) 0.4 MG CAPS capsule Take 1 capsule (0.4 mg total) by mouth daily. 30 capsule 0  . rasagiline (AZILECT) 1 MG TABS tablet Take 1 tablet (1 mg total) by mouth daily. (Patient not taking: Reported on 08/23/2014) 90 tablet 3  . [DISCONTINUED] losartan (COZAAR) 50 MG tablet Take 1 tablet (50 mg total) by mouth daily. 30 tablet 1   No current facility-administered medications for this visit.    Family History  Problem Relation Age of Onset  . Stroke Mother   . Alzheimer's disease Brother   . Stroke Father   . Alzheimer's disease Sister     History   Social History  . Marital Status: Widowed    Spouse Name: N/A  . Number of Children: 3  . Years of Education: 15   Occupational History  . retired    Social History Main Topics  . Smoking status: Former Smoker -- 0.80 packs/day for 15 years    Types: Cigarettes    Quit date: 04/15/1965  . Smokeless tobacco: Never Used  . Alcohol Use: 6.0 oz/week    0 Standard drinks or equivalent, 10 Glasses of wine per week     Comment: 1 glass of wine every night (6oz glass)  . Drug Use: No  . Sexual Activity: Yes   Other Topics Concern  . Not on file   Social History Narrative   Lives at home with significant other.       ROS: [x]  Positive   [ ]  Negative   [ ]  All sytems reviewed and are negative  Cardiovascular: []  chest pain/pressure []  palpitations []  SOB lying flat []  DOE []  pain in legs while walking []  pain in feet when lying flat []  hx of DVT []  hx of phlebitis []  swelling in legs []  varicose veins  Pulmonary: []  productive cough []  asthma []  wheezing  Neurologic: []  weakness in []  arms []  legs []  numbness in []  arms []  legs [x] difficulty speaking or slurred speech-recent TIA []  temporary loss of vision in one eye []  dizziness  Hematologic: []  bleeding problems []  problems with blood clotting easily  GI []  vomiting blood []  blood in  stool  GU: []  burning with urination []  blood in urine  Psychiatric: []  hx of major depression  Integumentary: []  rashes []  ulcers  Constitutional: []  fever []  chills   PHYSICAL EXAMINATION:  Filed Vitals:   08/29/14 1255  BP: 145/76  Pulse: 66   Body mass index is 21.94 kg/(m^2).  General:  WDWN in NAD Gait: Normal  HENT: WNL, normocephalic Pulmonary: normal non-labored breathing , without Rales, rhonchi,  wheezing Cardiac: RRR, without  Murmurs, rubs or gallops; without carotid bruits Abdomen: soft, NT, no masses; well healed cholecystectomy scar.  Skin: without rashes, without ulcers  Vascular Exam/Pulses:  Right Left  Radial 2+ (normal) 2+ (normal)  Femoral 2+ (normal) 2+ (normal)  Popliteal Unable to palpate  Unable to palpate   DP 2+ (normal) 2+ (normal)  PT Unable to palpate  Unable to palpate    Extremities: without ischemic changes, without Gangrene , without cellulitis; without open wounds;  Musculoskeletal: no muscle wasting or atrophy  Neurologic: A&O X 3; Appropriate Affect ; SENSATION: normal; MOTOR FUNCTION:  moving all extremities equally. Speech is fluent/normal; mild tremor of right hand.   Non-Invasive Vascular Imaging:   CTA 07/19/14: IMPRESSION: Further mild contraction of cryoablation zone in the lower pole the right kidney. No evidence of tumor recurrence or metastatic disease within the abdomen.  Stable bilateral renal cysts and chronic right UPJ obstruction.  Prior cholecystectomy, with stable chronic biliary ductal Dilatation. Mild increase in size of 3.9 cm infrarenal abdominal aortic aneurysm. Recommend followup by ultrasound in 2 years. This recommendation follows ACR consensus guidelines: White Paper of the ACR Incidental Findings Committee II on Vascular Findings. J Am Coll Radiol 2013; 10:789-794.  Pt meds includes: Statin:  Yes.   Beta Blocker:  No. Aspirin:  Yes.   ACEI:  No. ARB:  Yes.   Other  Antiplatelet/Anticoagulant:  Yes.   Xarelto   ASSESSMENT/PLAN:: 81 y.o. male with incidental finding of AAA    -pt's AAA measuring 3.9cm and he is asymptomatic.  He will follow up in one year for re-evaluation.   -he is to get a CTA for follow up with Dr. Kathlene Cote.  If he gets this, he will not need an ultrasound to evaluate his AAA. -very unlikely for rupture at this size, however, he is given instructions to present to ER with sudden onset of severe abdominal/back pain.  Otherwise, he will f/u in one year with scan or ultrasound.   Leontine Locket, PA-C [facility] [phone]  Clinic MD:  Pt seen and examined in conjunction with Dr. Trula Slade  I agree with the above.  I have seen and evaluated the patient.  He has a 3.9 cm infrarenal abdominal aortic aneurysm which was diagnosed with CT scan imaging.  He is asymptomatic.  I discussed the signs and symptoms of rupture and what to do should they occur.  I will have him follow-up with me in 1 year.  At that time he will get an abdominal ultrasound if he has not had a repeat CT scan of his abdomen.  We discussed proceeding with repair once his maximum diameter is greater than 5 cm.  All his questions were answered today.  He will follow up in one year  Wells Brabham

## 2014-09-06 ENCOUNTER — Telehealth: Payer: Self-pay | Admitting: Cardiology

## 2014-09-06 NOTE — Telephone Encounter (Signed)
Pt called in wanting to know if he could switch medications; Xarelto to Eliquis because its cheaper. If he is able to this he would like that prescription faxed to the [place] to Dr. Felton Clinton at [phone]. Please f/u with pt  Thanks

## 2014-09-08 NOTE — Telephone Encounter (Signed)
Pt. Wants to know if he can switch from xarelto to Eliquis , because its cheaper

## 2014-09-09 NOTE — Telephone Encounter (Signed)
Ok to switch to Eliquis 2.5 mg bid.

## 2014-09-13 NOTE — Telephone Encounter (Signed)
Pt. Informed that it is ok to switch to Eliquis 2.5 mg , pt stated he was out of town and would let me know when he gets back

## 2014-09-14 ENCOUNTER — Telehealth: Payer: Self-pay | Admitting: Pulmonary Disease

## 2014-09-14 MED ORDER — TAMSULOSIN HCL 0.4 MG PO CAPS: 0.4000 mg | ORAL_CAPSULE | Freq: Every day | ORAL | Status: DC

## 2014-09-14 MED ORDER — LOSARTAN POTASSIUM-HCTZ 100-12.5 MG PO TABS: 1.0000 | ORAL_TABLET | Freq: Every day | ORAL | Status: DC

## 2014-09-14 NOTE — Telephone Encounter (Signed)
lmcb x2 for pt. 

## 2014-09-14 NOTE — Telephone Encounter (Signed)
Pt returned call - [phone]

## 2014-09-14 NOTE — Telephone Encounter (Signed)
Rx's have been sent in. Pt is aware. Nothing further was needed. 

## 2014-09-14 NOTE — Telephone Encounter (Signed)
lmtcb x1 for pt. 

## 2014-09-14 NOTE — Telephone Encounter (Signed)
[phone] returning a call

## 2014-09-22 ENCOUNTER — Telehealth: Payer: Self-pay | Admitting: Cardiology

## 2014-09-22 ENCOUNTER — Other Ambulatory Visit: Payer: Self-pay | Admitting: *Deleted

## 2014-09-22 MED ORDER — APIXABAN 2.5 MG PO TABS: 2.5000 mg | ORAL_TABLET | Freq: Two times a day (BID) | ORAL | Status: DC

## 2014-09-22 NOTE — Telephone Encounter (Signed)
Prescription printed pt. Made aware of change , will fax asa soon as Dr. Percival Spanish sigh's script

## 2014-09-22 NOTE — Telephone Encounter (Signed)
Pt called in stating that Dr. Percival Spanish will be switching him from Xarelto to Eliquis. He says that new prescription needs to be faxed to the [place]. That fax number is [phone].  Thanks

## 2014-10-06 ENCOUNTER — Telehealth: Payer: Self-pay | Admitting: Cardiology

## 2014-10-06 MED ORDER — APIXABAN 2.5 MG PO TABS: 2.5000 mg | ORAL_TABLET | Freq: Two times a day (BID) | ORAL | Status: DC

## 2014-10-06 NOTE — Telephone Encounter (Signed)
Pt needs his prescription for his Eliquis faxed to [phone]. Pt is there,please do this asap.

## 2014-10-06 NOTE — Telephone Encounter (Signed)
Spoke to [employer] with Dr.Guha's office requesting a 90 day eliquis prescription faxed to office at fax # [phone].Advised I will have Dr.Hochrein sign prescription this afternoon and fax.

## 2014-10-06 NOTE — Telephone Encounter (Signed)
Eliquis 90 day prescription faxed to Mechele Collin at Dr.Guha's office.

## 2014-10-10 ENCOUNTER — Telehealth: Payer: Self-pay | Admitting: Cardiology

## 2014-10-10 ENCOUNTER — Other Ambulatory Visit: Payer: Self-pay

## 2014-10-10 NOTE — Telephone Encounter (Signed)
Spoke to Priddy at [facility]. She states that Dr Felton Clinton want clarification whether patient is taking Eliquis or Xarelto because he has 2 prescription at the [place]. Per telephone message on 09/06/14,patient is to stop Xarelto and start Eliquis 2.5 mg twice a day do to the cost.  Enid Derry request a copy of the information fax [phone]) RN routed telephone message from 09/06/14 and last office note from 08/12/14.

## 2014-10-24 ENCOUNTER — Encounter: Payer: Self-pay | Admitting: Primary Care

## 2014-10-24 ENCOUNTER — Encounter: Payer: Self-pay | Admitting: Neurology

## 2014-10-24 ENCOUNTER — Ambulatory Visit (INDEPENDENT_AMBULATORY_CARE_PROVIDER_SITE_OTHER): Payer: Medicare HMO | Admitting: Primary Care

## 2014-10-24 VITALS — BP 128/58 | HR 62 | Temp 97.4°F | Ht 76.0 in | Wt 182.8 lb

## 2014-10-24 DIAGNOSIS — E785 Hyperlipidemia, unspecified: Secondary | ICD-10-CM | POA: Diagnosis not present

## 2014-10-24 DIAGNOSIS — I480 Paroxysmal atrial fibrillation: Secondary | ICD-10-CM | POA: Diagnosis not present

## 2014-10-24 DIAGNOSIS — I10 Essential (primary) hypertension: Secondary | ICD-10-CM

## 2014-10-24 MED ORDER — SIMVASTATIN 20 MG PO TABS: 20.0000 mg | ORAL_TABLET | Freq: Every day | ORAL | Status: DC

## 2014-10-24 NOTE — Progress Notes (Signed)
Pre visit review using our clinic review tool, if applicable. No additional management support is needed unless otherwise documented below in the visit note. 

## 2014-10-24 NOTE — Progress Notes (Signed)
Subjective:    Patient ID: William [name], male    DOB: 04/29/1928, 81 y.o.   MRN: [id number]  HPI  [name] is an 81 year old male who presents today to establish care and discuss the problems mentioned below. Will review old records. He is a current patient of Dr. Lenna Gilford with pulmonology and is seeking care for PCP.  1) Hypertension: Present for years and is managed on hyzaar 100/12.5 and hydralazine 25 mg. Denies chest pain, SOB, headaches. He checks his blood pressure at home once weekly and will get about 120's-130's/70's.  2) Atrial Fibrillation: Diagnosed  April 1st 2016 and is managed on Xarelto but is switching Elliquis currently due to expense. He is managed by Dr. Alba Cory with cardiology. Next appointment in August. Denies palpitations or dizziness.  3) [name]: Diagnosed in 2010 and managed on Azilect 1 mg and sinemet IR. He is following with Dr. Rexene Alberts with [facility]. Next appointment is later this summer.    Review of Systems  Constitutional: Negative for fatigue and unexpected weight change.  HENT: Negative for rhinorrhea.   Respiratory: Negative for cough and shortness of breath.   Cardiovascular: Negative for chest pain.  Gastrointestinal: Negative for diarrhea and constipation.  Genitourinary: Negative for dysuria, frequency and difficulty urinating.  Musculoskeletal: Positive for myalgias and arthralgias.  Skin: Negative for rash.  Allergic/Immunologic: Negative for environmental allergies.  Neurological: Negative for dizziness, numbness and headaches.  Psychiatric/Behavioral:       Denies concerns for anxiety or depression       Past Medical History  Diagnosis Date  . Hyperlipidemia   . Hx of colonic polyps   . Benign prostatic hypertrophy with urinary obstruction   . TIA (transient ischemic attack)     pt does not recall this hx "but may have had one today" (08/09/2014)  . Subdural hematoma 2006  . Gout   . Hypertension    sees [name]  . Thyroid disease     medullary thyroid   . Pneumonia     hx of in 1952  . Parkinson disease   . A-fib   . Complication of anesthesia     "difficulty intubation, had to use fiberoptic 2006"  . Difficult intubation   . Malignant neoplasm of thyroid gland     medullary carcinoma  . Primary skin squamous cell carcinoma   . Basal cell carcinoma   . Hypothyroidism   . DJD (degenerative joint disease)   . Arthritis     "right knee" (08/09/2014)  . Gout     "periodically" (08/09/2014)  . Kidney tumor     "tumor on cyst on kidney"   . DJD (degenerative joint disease)   . Parkinson disease     History   Social History  . Marital Status: Widowed    Spouse Name: N/A  . Number of Children: 3  . Years of Education: 15   Occupational History  . retired    Social History Main Topics  . Smoking status: Former Smoker -- 0.80 packs/day for 15 years    Types: Cigarettes    Quit date: 04/15/1965  . Smokeless tobacco: Never Used  . Alcohol Use: 6.0 oz/week    0 Standard drinks or equivalent, 10 Glasses of wine per week     Comment: 1 glass of wine every night (6oz glass)  . Drug Use: No  . Sexual Activity: Yes   Other Topics Concern  . Not on file  Social History Narrative   Married.   Lives at home with significant other.   Retried. 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   Enjoys golfing, yard work, spending time with family.         Past Surgical History  Procedure Laterality Date  . Cataract extraction w/ intraocular lens  implant, bilateral Bilateral ~ 1998  . Cholecystectomy    . Thyroidectomy      medullary carcinoma  . Eye surgery    . Knee arthroscopy Right   . Appendectomy    . Tonsillectomy    . Lumbar laminectomy/decompression microdiscectomy  05/14/2012    Procedure: LUMBAR LAMINECTOMY/DECOMPRESSION MICRODISCECTOMY 1 LEVEL;  Surgeon: [name], MD;  Location: [facility];  Service: Neurosurgery;  Laterality: Left;  Left Lumbar  four-five Laminectomy/Diskectomy/Far lateral diskectomy  . Cryoablation  07-10-12    "tumor on cyst on my kidney; had an ablation"  . Skin cancer excision  "several"    "back of my neck; right eye; clavicle; nose"  . Inguinal hernia repair Right   . Back surgery    . Subdural hematoma evacuation via craniotomy  2006  . Kidney surgery      Family History  Problem Relation Age of Onset  . Stroke Mother   . Alzheimer's disease Brother   . Stroke Father   . Alzheimer's disease Sister     No Known Allergies  Current Outpatient Prescriptions on File Prior to Visit  Medication Sig Dispense Refill  . aspirin EC 81 MG tablet Take 1 tablet (81 mg total) by mouth daily. 30 tablet 0  . carbidopa-levodopa (SINEMET IR) 25-100 MG per tablet Take 1 tablet by mouth 4 (four) times daily. At 8, 12, 5 PM, and 9 or 10 PM 360 tablet 3  . hydrALAZINE (APRESOLINE) 25 MG tablet Take 1 tablet (25 mg total) by mouth 3 (three) times daily. 90 tablet 3  . levothyroxine (SYNTHROID) 100 MCG tablet Take 1 tablet (100 mcg total) by mouth daily before breakfast. 90 tablet 2  . losartan-hydrochlorothiazide (HYZAAR) 100-12.5 MG per tablet Take 1 tablet by mouth daily. 90 tablet 3  . Multiple Vitamin (MULTIVITAMIN WITH MINERALS) TABS Take 1 tablet by mouth daily.    . Probiotic Product (ALIGN) 4 MG CAPS Take 1 capsule by mouth daily.    . rasagiline (AZILECT) 1 MG TABS tablet Take 1 tablet (1 mg total) by mouth daily. 90 tablet 3  . tamsulosin (FLOMAX) 0.4 MG CAPS capsule Take 1 capsule (0.4 mg total) by mouth daily. 90 capsule 3  . apixaban (ELIQUIS) 2.5 MG TABS tablet Take 1 tablet (2.5 mg total) by mouth 2 (two) times daily. (Patient not taking: Reported on 10/24/2014) 180 tablet 3  . [DISCONTINUED] losartan (COZAAR) 50 MG tablet Take 1 tablet (50 mg total) by mouth daily. 30 tablet 1   No current facility-administered medications on file prior to visit.    BP 128/58 mmHg  Pulse 62  Temp(Src) 97.4 F (36.3 C)  (Oral)  Ht 6\' 4"  (1.93 m)  Wt 182 lb 12.8 oz (82.918 kg)  BMI 22.26 kg/m2  SpO2 98%    Objective:   Physical Exam  Cardiovascular: Normal rate and regular rhythm.   Pulmonary/Chest: Effort normal and breath sounds normal.  Musculoskeletal:  Resting tremor to right hand.  Skin: Skin is warm and dry.  Psychiatric: He has a normal mood and affect.          Assessment & Plan:

## 2014-10-24 NOTE — Assessment & Plan Note (Signed)
Regular rhythm today. Taking Eliquis daily. Following with cardiology, next appointment in August.

## 2014-10-24 NOTE — Assessment & Plan Note (Addendum)
Stable on current regimen. Continue same. Follow up in 3 months.

## 2014-10-24 NOTE — Patient Instructions (Signed)
Stop taking Lipitor. Start Simvastatin 20 mg tablets, they have been sent to your pharmacy.  Follow up in 3 months for re-evaluation of blood pressure. Call me if you need any referrals for insurance purposes.  It was a pleasure to meet you today! Please don't hesitate to call me with any questions. Welcome to [facility]!

## 2014-10-25 ENCOUNTER — Ambulatory Visit: Payer: Commercial Managed Care - HMO | Admitting: Neurology

## 2014-11-01 ENCOUNTER — Ambulatory Visit (INDEPENDENT_AMBULATORY_CARE_PROVIDER_SITE_OTHER): Payer: Medicare HMO | Admitting: Neurology

## 2014-11-01 ENCOUNTER — Encounter: Payer: Self-pay | Admitting: Neurology

## 2014-11-01 VITALS — BP 152/75 | HR 60 | Resp 18 | Ht 76.0 in | Wt 181.0 lb

## 2014-11-01 DIAGNOSIS — G20 Parkinson's disease: Secondary | ICD-10-CM | POA: Diagnosis not present

## 2014-11-01 DIAGNOSIS — G459 Transient cerebral ischemic attack, unspecified: Secondary | ICD-10-CM | POA: Diagnosis not present

## 2014-11-01 DIAGNOSIS — I4891 Unspecified atrial fibrillation: Secondary | ICD-10-CM | POA: Diagnosis not present

## 2014-11-01 DIAGNOSIS — G20A1 Parkinson's disease without dyskinesia, without mention of fluctuations: Secondary | ICD-10-CM

## 2014-11-01 NOTE — Patient Instructions (Addendum)
We will continue with Sinemet at 4 times a day. We may consider increasing it next time. Continue with Azilect once daily. Drink more water!  Follow up in about 4 months.

## 2014-11-01 NOTE — Progress Notes (Signed)
Subjective:    Patient ID: William [name] is a 81 y.o. male.  HPI     Interim history:   William [name] is an 81 year old right-handed gentleman with an underlying medical history of right TIA in January 2003, SDH (s/p left craniotomy in August 2006), hypertension, hypothyroidism, thyroid cancer, partial onset seizures, off Dilantin, lumbar spine disease, status post lower back surgery at L4-5 in January 2014, renal tumor, status post kidney tumor ablative surgery in March 2014, who presents for followup consultation of his right-sided predominant [name]. He is unaccompanied today. I last saw him on 04/29/2014, at which time he reported doing well overall but he was more fatigued and had some excessive daytime somnolence. Memory was stable. His mood was stable. He noted some blood pressure fluctuations. Sometimes he had a dull headache and sometimes he felt lightheaded. He had some anxiety over his lady friend's health, as she had fallen and broken rib. He continued to walk regularly and played golf. He had no recent falls. I asked him to continue with his medications, Azilect once daily and Sinemet 4 times a day.  Today, 11/01/2014: He reports doing well considering. He was diagnosed with A fib with RVR. He was admitted to the hospital and I reviewed the hospital records from 07/17/14 through 4/416. He was started on Xarelto at the time. He was then re-admitted on 08/09/14 to 08/10/14 and I reviewed the records. He had AMS and slurring of speech and was diagnosed with a TIA. He was seen by neuro and baby aspirin was added to Xarelto. He presented back to the ER on 08/18/14. He a [place] wo contrast on  08/18/2014 , which was negative for any acute changes. He was seen by the Neurologist in the emergency room and it was felt that a full TIA workup was not necessary. He had presented with hypertension and some slurring of speech. Overall, he is doing fairly well he feels. He was switched just today  from Xarelto to Eliquis,  Due to the [place]  Not being on formulary at the [place].  He does endorse that he had some recent stressors in the past couple of months. He also had some travels.  Sadly, his younger brother passed away last week with advanced Alzheimer's disease.   He had an MRI brain and MRA head on 08/09/14: MRI HEAD: No acute intracranial process, specifically no acute ischemia. Involutional changes. Mild white matter changes suggest chronic small vessel ischemic disease. MRA HEAD: No acute large vessel occlusion or high-grade stenosis. Mid grade stenosis RIGHT P2/3 segment.   Previously:   I saw him on 10/28/2013, at which time he reported doing well. In particular, had no cognitive complaints, no mood issues, and continued to play golf 3 times a week. He was driving well. I suggested an increase in his levodopa to 1 pill 4 times a day of the 25-100 milligrams strength. [facility] and had seen a [place] neurologist as well.    I saw him on 12/07/2012, at which time I felt that he was doing well on Azilect and levodopa. He called in December 2014 with problems with blood pressure fluctuations. He was wondering if this came from the [place] but I did not think it was due to the Azilect per se. I was reluctant to take him off of it.    I first met him on 06/03/2012 and he previously followed by Dr. Morene Antu. He has had  primarily right-sided symptoms with regards to his Parkinson's, diagnosed in 2010 with symptoms dating back to late 2009 or early 2010. He had briefly tried Mirapex but was taken off d/t hypotension. L spine MRI in June 2013 showed renal cysts, degenerative joint disease most prominent at L4-5. He tried acupuncture. His MRI of the lumbar spine showed abnormalities with his kidney with a cyst and a tumor and he had ablative surgery on 07/10/12 and had a FU CT done. He had lower back surgery on 05/14/12.   I saw him back on 09/03/2012 and I  suggested starting Azilect. He has been tolerating it well and both he and his girlfriend felt that he did better with it in terms of dexterity and fine motor control. He had some hypotension and lightheadedness and reduced his BP medication. He has been having some issue with gout.   His Past Medical History Is Significant For: Past Medical History  Diagnosis Date  . Hyperlipidemia   . Hx of colonic polyps   . Benign prostatic hypertrophy with urinary obstruction   . TIA (transient ischemic attack)     pt does not recall this hx "but may have had one today" (08/09/2014)  . Subdural hematoma 2006  . Gout   . Hypertension     sees [name]  . Thyroid disease     medullary thyroid   . Pneumonia     hx of in 1952  . Parkinson disease   . A-fib   . Complication of anesthesia     "difficulty intubation, had to use fiberoptic 2006"  . Difficult intubation   . Malignant neoplasm of thyroid gland     medullary carcinoma  . Primary skin squamous cell carcinoma   . Basal cell carcinoma   . Hypothyroidism   . DJD (degenerative joint disease)   . Arthritis     "right knee" (08/09/2014)  . Gout     "periodically" (08/09/2014)  . Kidney tumor     "tumor on cyst on kidney"   . DJD (degenerative joint disease)   . Parkinson disease     His Past Surgical History Is Significant For: Past Surgical History  Procedure Laterality Date  . Cataract extraction w/ intraocular lens  implant, bilateral Bilateral ~ 1998  . Cholecystectomy    . Thyroidectomy      medullary carcinoma  . Eye surgery    . Knee arthroscopy Right   . Appendectomy    . Tonsillectomy    . Lumbar laminectomy/decompression microdiscectomy  05/14/2012    Procedure: LUMBAR LAMINECTOMY/DECOMPRESSION MICRODISCECTOMY 1 LEVEL;  Surgeon: [name], MD;  Location: [facility];  Service: Neurosurgery;  Laterality: Left;  Left Lumbar four-five Laminectomy/Diskectomy/Far lateral diskectomy  . Cryoablation  07-10-12     "tumor on cyst on my kidney; had an ablation"  . Skin cancer excision  "several"    "back of my neck; right eye; clavicle; nose"  . Inguinal hernia repair Right   . Back surgery    . Subdural hematoma evacuation via craniotomy  2006  . Kidney surgery      His Family History Is Significant For: Family History  Problem Relation Age of Onset  . Stroke Mother   . Alzheimer's disease Brother   . Stroke Father   . Alzheimer's disease Sister     His Social History Is Significant For: History   Social History  . Marital Status: Widowed    Spouse Name: N/A  . Number of  Children: 3  . Years of Education: 15   Occupational History  . retired    Social History Main Topics  . Smoking status: Former Smoker -- 0.80 packs/day for 15 years    Types: Cigarettes    Quit date: 04/15/1965  . Smokeless tobacco: Never Used  . Alcohol Use: 6.0 oz/week    0 Standard drinks or equivalent, 10 Glasses of wine per week     Comment: 1 glass of wine every night (6oz glass)  . Drug Use: No  . Sexual Activity: Yes   Other Topics Concern  . None   Social History Narrative   Married.   Lives at home with significant other.   Retried. 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   Enjoys golfing, yard work, spending time with family.         His Allergies Are:  No Known Allergies:   His Current Medications Are:  Outpatient Encounter Prescriptions as of 11/01/2014  Medication Sig  . apixaban (ELIQUIS) 2.5 MG TABS tablet Take 1 tablet (2.5 mg total) by mouth 2 (two) times daily.  Marland Kitchen aspirin EC 81 MG tablet Take 1 tablet (81 mg total) by mouth daily.  . carbidopa-levodopa (SINEMET IR) 25-100 MG per tablet Take 1 tablet by mouth 4 (four) times daily. At 8, 12, 5 PM, and 9 or 10 PM  . hydrALAZINE (APRESOLINE) 25 MG tablet Take 1 tablet (25 mg total) by mouth 3 (three) times daily.  Marland Kitchen levothyroxine (SYNTHROID) 100 MCG tablet Take 1 tablet (100 mcg total) by mouth daily before breakfast.  .  losartan-hydrochlorothiazide (HYZAAR) 100-12.5 MG per tablet Take 1 tablet by mouth daily.  . Multiple Vitamin (MULTIVITAMIN WITH MINERALS) TABS Take 1 tablet by mouth daily.  . Probiotic Product (ALIGN) 4 MG CAPS Take 1 capsule by mouth daily.  . rasagiline (AZILECT) 1 MG TABS tablet Take 1 tablet (1 mg total) by mouth daily.  . simvastatin (ZOCOR) 20 MG tablet Take 1 tablet (20 mg total) by mouth at bedtime.  . tamsulosin (FLOMAX) 0.4 MG CAPS capsule Take 1 capsule (0.4 mg total) by mouth daily.  . [DISCONTINUED] atorvastatin (LIPITOR) 20 MG tablet    No facility-administered encounter medications on file as of 11/01/2014.  :  Review of Systems:  Out of a complete 14 point review of systems, all are reviewed and negative with the exception of these symptoms as listed below:   Review of Systems  Neurological:       Patient has question about clinical trails for a pump that releases carb.Jake Samples. In May 2016 patient went to hospital for A-fib and TIA.     Objective:  Neurologic Exam  Physical Exam Physical Examination:   Filed Vitals:   11/01/14 1359  BP: 152/75  Pulse: 60  Resp: 18    General Examination: The patient is a very pleasant 81 y.o. male in no acute distress. He has no dizziness or lightheadedness upon standing.  He is in fairly good spirits today.  HEENT: He has moderate degree of nuchal rigidity with some decrease in passive range of motion. He reports no neck pain. Face is symmetric with decrease in facial animation. Speech is mildly low in volume but not dysarthric. Hearing is grossly intact. Extraocular tracking is fairly good with mild saccadic breakdown. Pupils are reactive to light. Airway is clear with mild mouth dryness noted.  Chest is clear to auscultation without wheezing, or rhonchi or crackles noted.  Heart sounds are normal without murmurs, rubs  or gallops.  Abdomen is soft, nontender with normal bowel sounds.  He has no pitting edema in the distal lower  extremities.  Skin is warm and dry.  Neurologically: Mental status: The patient is awake, alert and oriented in all 4 spheres. His memory, attention, language and knowledge are appropriate. He has no aphasia, agnosia, apraxia or anomia. Cranial nerves are as described under HEENT exam. Motor exam: Normal bulk and strength is noted. He has increased tone in the right upper extremity and to a lesser degree in the right lower extremity. Mild cogwheeling is noted. He has a moderate and intermittent resting tremor in the right upper extremity only. He has no other tremor. He has no significant action or postural tremor today.  Fine motor skills with finger taps and hand movements are moderately impaired on the right upper extremity and otherwise mildly impaired in the right lower extremity and mildly impaired on the left side. He stands up fairly well from the seated position and his posture is age-appropriate to mildly stooped for age, unchanged . He walks with decreased arm swing on the right and turns fairly well. Balance is preserved. Sensory exam is intact to light touch. Romberg is negative. Reflexes are 1+.   Assessment and Plan:   In summary, Mr. Mcnelly is a very pleasant 81 year old gentleman with right-sided predominant, tremor predominant [name], in its 6th+ year. He has a complex history and is status post lower back surgery at level L4/5 in January 2014, and kidney tumor ablative surgery in March 2014 and recent diagnosis of atrial fibrillation in April 2016 with an admission in early April 2016 as well as TIA diagnosis in late April 2016. He is currently on  Baby aspirin as well as Eliquis.  He has had no further episodes of A. Fib. He had another ER visit in early May  Due to hypertension and slurring of speech. I reviewed his hospital records. Parkinson's wise he is stable. We mutually agreed to continue Sinemet 4 times a day. Down the road, we will consider increasing it to 3  hourly with 5 doses. We also talked about the Duopa  Pump option which may be consider down the road.  He did not need any refills today. I suggested a 4 month checkup , sooner if needed. I answered all his questions today and he was in agreement. I spent 25 minutes in total face-to-face time with the patient, more than 50% of which was spent in counseling and coordination of care, reviewing test results, reviewing medication and discussing or reviewing the diagnosis of PD, TIA, the prognosis and treatment options.

## 2014-11-04 ENCOUNTER — Ambulatory Visit (INDEPENDENT_AMBULATORY_CARE_PROVIDER_SITE_OTHER)
Admission: RE | Admit: 2014-11-04 | Discharge: 2014-11-04 | Disposition: A | Discharge status: Home / Self Care | Payer: Commercial Managed Care - HMO | Source: Ambulatory Visit | Attending: Primary Care | Admitting: Primary Care

## 2014-11-04 ENCOUNTER — Ambulatory Visit (INDEPENDENT_AMBULATORY_CARE_PROVIDER_SITE_OTHER): Payer: Commercial Managed Care - HMO | Admitting: Primary Care

## 2014-11-04 ENCOUNTER — Encounter: Payer: Self-pay | Admitting: Primary Care

## 2014-11-04 VITALS — BP 160/80 | HR 68 | Temp 98.3°F | Wt 181.0 lb

## 2014-11-04 DIAGNOSIS — R0789 Other chest pain: Secondary | ICD-10-CM

## 2014-11-04 DIAGNOSIS — R05 Cough: Secondary | ICD-10-CM | POA: Diagnosis not present

## 2014-11-04 DIAGNOSIS — J449 Chronic obstructive pulmonary disease, unspecified: Secondary | ICD-10-CM | POA: Diagnosis not present

## 2014-11-04 DIAGNOSIS — R079 Chest pain, unspecified: Secondary | ICD-10-CM | POA: Diagnosis not present

## 2014-11-04 DIAGNOSIS — R059 Cough, unspecified: Secondary | ICD-10-CM

## 2014-11-04 DIAGNOSIS — I10 Essential (primary) hypertension: Secondary | ICD-10-CM

## 2014-11-04 LAB — BASIC METABOLIC PANEL
BUN: 18 mg/dL (ref 6–23)
CALCIUM: 8.2 mg/dL — AB (ref 8.4–10.5)
CHLORIDE: 100 meq/L (ref 96–112)
CO2: 31 mEq/L (ref 19–32)
Creat: 1.43 mg/dL — ABNORMAL HIGH (ref 0.50–1.35)
Glucose, Bld: 100 mg/dL — ABNORMAL HIGH (ref 70–99)
POTASSIUM: 4.1 meq/L (ref 3.5–5.3)
SODIUM: 141 meq/L (ref 135–145)

## 2014-11-04 LAB — CBC
HCT: 42.6 % (ref 39.0–52.0)
Hemoglobin: 14.5 g/dL (ref 13.0–17.0)
MCH: 30.3 pg (ref 26.0–34.0)
MCHC: 34 g/dL (ref 30.0–36.0)
MCV: 89.1 fL (ref 78.0–100.0)
MPV: 9.5 fL (ref 8.6–12.4)
PLATELETS: 153 10*3/uL (ref 150–400)
RBC: 4.78 MIL/uL (ref 4.22–5.81)
RDW: 14.3 % (ref 11.5–15.5)
WBC: 7.6 10*3/uL (ref 4.0–10.5)

## 2014-11-04 NOTE — Progress Notes (Signed)
Subjective:    Patient ID: William [name], male    DOB: 07/08/1928, 81 y.o.   MRN: [id number]  HPI  William [name] is an 81 year old male who presents today for follow up.from EMS visit last night. His male partner was recently diagnosed with pneumonia. He woke up at 4:30am this morning with left sided chest discomfort. He describes his pain as a sore muscle. He's been coughing for the past 3 days.EMS came to the house and performed an ECG and determined he did not have a heart attack. He denies fevers, chills, shortness of breath, diaphoresis. Overall he's feeling well. He would like blood work today to confirm that he did not have a heart attack.  He's been confused about his medications lately. He has 2 prescription bottles: one bottle is Losartan/HCTZ 100/12.5mg  and the other bottle is of Losartan 25mg . He's been taking the Losartan 25 mg.   BP Readings from Last 3 Encounters:  11/04/14 160/80  11/01/14 152/75  10/24/14 128/58     Review of Systems  Constitutional: Negative for fever, chills and diaphoresis.  HENT: Positive for postnasal drip. Negative for congestion, ear pain and sore throat.   Respiratory: Positive for cough and chest tightness. Negative for shortness of breath.   Cardiovascular: Negative for chest pain and palpitations.  Neurological: Negative for dizziness and headaches.       Past Medical History  Diagnosis Date  . Hyperlipidemia   . Hx of colonic polyps   . Benign prostatic hypertrophy with urinary obstruction   . TIA (transient ischemic attack)     pt does not recall this hx "but may have had one today" (08/09/2014)  . Subdural hematoma 2006  . Gout   . Hypertension     sees [name]  . Thyroid disease     medullary thyroid   . Pneumonia     hx of in 1952  . Parkinson disease   . A-fib   . Complication of anesthesia     "difficulty intubation, had to use fiberoptic 2006"  . Difficult intubation   . Malignant neoplasm of thyroid  gland     medullary carcinoma  . Primary skin squamous cell carcinoma   . Basal cell carcinoma   . Hypothyroidism   . DJD (degenerative joint disease)   . Arthritis     "right knee" (08/09/2014)  . Gout     "periodically" (08/09/2014)  . Kidney tumor     "tumor on cyst on kidney"   . DJD (degenerative joint disease)   . Parkinson disease     History   Social History  . Marital Status: Widowed    Spouse Name: N/A  . Number of Children: 3  . Years of Education: 15   Occupational History  . retired    Social History Main Topics  . Smoking status: Former Smoker -- 0.80 packs/day for 15 years    Types: Cigarettes    Quit date: 04/15/1965  . Smokeless tobacco: Never Used  . Alcohol Use: 6.0 oz/week    0 Standard drinks or equivalent, 10 Glasses of wine per week     Comment: 1 glass of wine every night (6oz glass)  . Drug Use: No  . Sexual Activity: Yes   Other Topics Concern  . Not on file   Social History Narrative   Married.   Lives at home with significant other.   Retried. 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   Enjoys  golfing, yard work, spending time with family.         Past Surgical History  Procedure Laterality Date  . Cataract extraction w/ intraocular lens  implant, bilateral Bilateral ~ 1998  . Cholecystectomy    . Thyroidectomy      medullary carcinoma  . Eye surgery    . Knee arthroscopy Right   . Appendectomy    . Tonsillectomy    . Lumbar laminectomy/decompression microdiscectomy  05/14/2012    Procedure: LUMBAR LAMINECTOMY/DECOMPRESSION MICRODISCECTOMY 1 LEVEL;  Surgeon: [name], MD;  Location: [facility];  Service: Neurosurgery;  Laterality: Left;  Left Lumbar four-five Laminectomy/Diskectomy/Far lateral diskectomy  . Cryoablation  07-10-12    "tumor on cyst on my kidney; had an ablation"  . Skin cancer excision  "several"    "back of my neck; right eye; clavicle; nose"  . Inguinal hernia repair Right   . Back surgery    . Subdural  hematoma evacuation via craniotomy  2006  . Kidney surgery      Family History  Problem Relation Age of Onset  . Stroke Mother   . Alzheimer's disease Brother   . Stroke Father   . Alzheimer's disease Sister     No Known Allergies  Current Outpatient Prescriptions on File Prior to Visit  Medication Sig Dispense Refill  . apixaban (ELIQUIS) 2.5 MG TABS tablet Take 1 tablet (2.5 mg total) by mouth 2 (two) times daily. 180 tablet 3  . aspirin EC 81 MG tablet Take 1 tablet (81 mg total) by mouth daily. 30 tablet 0  . carbidopa-levodopa (SINEMET IR) 25-100 MG per tablet Take 1 tablet by mouth 4 (four) times daily. At 8, 12, 5 PM, and 9 or 10 PM 360 tablet 3  . hydrALAZINE (APRESOLINE) 25 MG tablet Take 1 tablet (25 mg total) by mouth 3 (three) times daily. 90 tablet 3  . levothyroxine (SYNTHROID) 100 MCG tablet Take 1 tablet (100 mcg total) by mouth daily before breakfast. 90 tablet 2  . losartan-hydrochlorothiazide (HYZAAR) 100-12.5 MG per tablet Take 1 tablet by mouth daily. 90 tablet 3  . Multiple Vitamin (MULTIVITAMIN WITH MINERALS) TABS Take 1 tablet by mouth daily.    . Probiotic Product (ALIGN) 4 MG CAPS Take 1 capsule by mouth daily.    . rasagiline (AZILECT) 1 MG TABS tablet Take 1 tablet (1 mg total) by mouth daily. 90 tablet 3  . simvastatin (ZOCOR) 20 MG tablet Take 1 tablet (20 mg total) by mouth at bedtime. 90 tablet 3  . tamsulosin (FLOMAX) 0.4 MG CAPS capsule Take 1 capsule (0.4 mg total) by mouth daily. 90 capsule 3  . [DISCONTINUED] losartan (COZAAR) 50 MG tablet Take 1 tablet (50 mg total) by mouth daily. 30 tablet 1   No current facility-administered medications on file prior to visit.    BP 160/80 mmHg  Pulse 68  Temp(Src) 98.3 F (36.8 C) (Tympanic)  Wt 181 lb (82.101 kg)  SpO2 96%    Objective:   Physical Exam  Constitutional: He is oriented to person, place, and time. He appears well-nourished.  HENT:  Nose: Right sinus exhibits no maxillary sinus  tenderness and no frontal sinus tenderness. Left sinus exhibits no maxillary sinus tenderness and no frontal sinus tenderness.  Mouth/Throat: Oropharynx is clear and moist.  Eyes: Conjunctivae are normal. Pupils are equal, round, and reactive to light.  Neck: Neck supple.  Cardiovascular: Normal rate and regular rhythm.   Pulmonary/Chest: Effort normal and breath sounds normal.  He has no decreased breath sounds.  Lymphadenopathy:    He has no cervical adenopathy.  Neurological: He is alert and oriented to person, place, and time.  Skin: Skin is warm and dry.          Assessment & Plan:  Chest tightness:  EMS visit last night for left sided chest pain. He describes pain as a sore muscle. EMS evaluated last night and did not find MI on ECG. He has no chest pain today and does not exhibit signs of MI. ECG does not seem to show heart attack. Due to his history of parkinson it's difficul to read ECG completely. Will do troponin per his request. Also BMP and CBC today. Xray to rule out pneumonia due to recent exposure and dry cough. Push fluids. Will update patient with results.

## 2014-11-04 NOTE — Progress Notes (Signed)
Pre visit review using our clinic review tool, if applicable. No additional management support is needed unless otherwise documented below in the visit note. 

## 2014-11-04 NOTE — Patient Instructions (Addendum)
Complete lab work prior to leaving today. I will notify you of your results.  Complete xray(s) prior to leaving today. I will contact you regarding your results.  Your EKG did not show a heart attack.  Start taking the losartan/hydrochlorothiazide 100/12.5mg  tablets for blood pressure.  It was nice to see you!

## 2014-11-04 NOTE — Assessment & Plan Note (Signed)
Confused about meds. He has RX's for losartan/HCTZ and losartan. He's been taking the losartan 25mg . BP elevated today. Advised patient to stop Losartan 25 and to resume his losartan/HCTZ 100/12.5 as previously prescribed. Will follow up with BP.

## 2014-11-05 LAB — TROPONIN I: Troponin I: 0.01 ng/mL (ref <0.06)

## 2014-11-22 ENCOUNTER — Encounter: Payer: Self-pay | Admitting: Pulmonary Disease

## 2014-11-22 ENCOUNTER — Ambulatory Visit (INDEPENDENT_AMBULATORY_CARE_PROVIDER_SITE_OTHER): Payer: Commercial Managed Care - HMO | Admitting: Pulmonary Disease

## 2014-11-22 VITALS — BP 142/76 | HR 65 | Temp 97.0°F | Wt 178.6 lb

## 2014-11-22 DIAGNOSIS — G458 Other transient cerebral ischemic attacks and related syndromes: Secondary | ICD-10-CM | POA: Diagnosis not present

## 2014-11-22 DIAGNOSIS — D3001 Benign neoplasm of right kidney: Secondary | ICD-10-CM

## 2014-11-22 DIAGNOSIS — I7091 Generalized atherosclerosis: Secondary | ICD-10-CM | POA: Diagnosis not present

## 2014-11-22 DIAGNOSIS — I10 Essential (primary) hypertension: Secondary | ICD-10-CM

## 2014-11-22 DIAGNOSIS — G20 Parkinson's disease: Secondary | ICD-10-CM

## 2014-11-22 DIAGNOSIS — N182 Chronic kidney disease, stage 2 (mild): Secondary | ICD-10-CM

## 2014-11-22 DIAGNOSIS — I480 Paroxysmal atrial fibrillation: Secondary | ICD-10-CM

## 2014-11-22 DIAGNOSIS — N400 Enlarged prostate without lower urinary tract symptoms: Secondary | ICD-10-CM

## 2014-11-22 NOTE — Patient Instructions (Signed)
Today we updated your med list in our EPIC system...    Continue your current medications the same...  Call for any questions or if I can be of service in any way.Marland KitchenMarland Kitchen

## 2014-11-22 NOTE — Progress Notes (Signed)
Subjective:    Patient ID: William [name], male    DOB: 1929-02-01, 81 y.o.   MRN: [id number]  HPI 81 y/o WM here for a follow up visit... he has multiple medical problems as noted below... followed for HBP, Carotid vasc dis w/ hx of TIA & prev subdural hematoma, Chol, & prev surg for medullary thyroid carcinoma... his wife passed away from metastatic breast cancer 11/09... 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  LABS 1/14 preop in EPIC> Chems- ok & Cr=1.3-1.5;  CBC- ok  LABS 8/14:  FLP- at goals on Simva20;  Chems- ok x Cr=1.5;  CBC- wnl;  TSH=0.54 & FreeT4=1.28 (0.6-1.60)...  CT Abd showed further contraction of the cryoablation zone within the lower part of the right kidney, no enhancement, bilat renal cysts, chr right UPJ stenosis; Incidentally noted [place], mult splenic granuloma, diffuse Ao atherosclerosis & dilatation of infrarenal Ao to 3.3cm, DJD in lumbar spine...   ~  December 21, 2013:  13mo ROV & William [name] describes a good interval, his stress is diminished & he's planning a river cruise;  We reviewed the following medical problems during today's office visit >>     HBP> on Hyzaar100-12.5 & Cardura8-1/2; BP=110/70 today & denies CP, palpit, SOB, edema...    ASPVD> on ASA 81mg  daily w/o cerebral ischemic symptoms etc; he is off Plavix ever since his SDH...    Chol> on Simva40-1/2, Fish Oil & diet rx; FLP 9/15 shows TChol 131, TG 72, HDL 50, LDL 67    Hx medullary thyroid cancer> on Synthroid150; followed by [facility] for [facility]; TFTs remain wnl w/ TSH 9/15 = 0.54 & FreeT3=3.0  (2.3-4.2) & FreeT4= 1.31 (0.6-1.60)    GI- Divertics, HxPolyps> on Align; last colon 2007 by DrSamLeB showed divertics, hems, no polyps & f/u suggested 60yrs but now>82y/o...    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    DJD, LBP> off Vicodin; he developed LBP after lifting a mattress 5/13; eval by DrBrooks & DrHirsh w/ MRI reported to show HNP at L4-5 (w/ myelopathy & radiculopathy); DrHirsh did LumbarLam 1/14 w/ decompression L4 & L5 w/ diskectomy=> improved...    Neuro- TIA, Hx subdural hemtoma, Tremor/Parkinson's> now followed by DrAthar for Neuro on Sinemet25-100Tid & Azilect1mg - he reports doing better overall...    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 We reviewed prob list, meds, xrays and labs> see below for updates >> OK Flu shot today...  LABS 9/15:  FLP- at goals on Simva20;  Chems- ok x Cr=1.4;  CBC- wnl;  TSH=0.54;  FreeT3 & FreeT4- wnl...  ~  April 26, 2014:  30mo ROV & add-on appt requested for HBP> he has been well controlled on Cardura8- 1/2 daily and Hyzaar100-12.5; over the last week his home BP checks  were in the 200/100 range and William [name] notes that "he feels it"; pt states that his head feels heavy- denies CP, palpit, SOB, edema; I note that wt is up 7# to 186# today, he denies eating salt; BP here today= 132/84 & they didn't bring his cuff; we decided to check CXR (norm heart size, NAD) and BMet (ok x HCO3=36, Cr=1.4); we decided to incr Cardura to 8mg /d and f/u 64mo; get on diet & gety wt back down; he will call in the interim for concerns...    We reviewed prob list, meds, xrays and labs> see below for updates >>   CXR 1/16 showed norm heart size, clear hyperinflated lungs, stable mediastinal calcif,  NAD...  LABS 1/16:  Chems- ok x HCO3=36 Cr=1.4.Marland KitchenMarland Kitchen PLAN>>  We decided to incr his Cardura to 8mg /d; he will continue the Hyzaar & monitor BP at home, ROV 41mo, call for questions...   ~  July 22, 2014:  41mo ROV & post hospital check> William [name] was Adm 4/3 - 07/18/14 by [facility] w/ AFib & RVR, after waking up weak, felt "funny", BP was low; In ER found AFib/ rvr, no CP, & he converted to NSR w/ Cardizem drip; CHA2DS2-VASc score= 5 for age>75, hx of TIA, male, HBP & started on Xarelto15/d (due to hx subdural in past)... Note: mult other changes to is meds during this brief hosp> he goes to the [facility] for his med refills...    HBP> on Losar25 & Cardizem60Bid; BP=120/70 today & denies CP, palpit, SOB, edema... Continue these new meds...    PAF> adm 4/16 w/ PAF &rvr; converted spont to NSR & meds adjusted, disch on Xarelto15/d...    ASPVD> off ASA 81 now since Xarelto started; denies cerebral ischemic symptoms etc; IR found ~4cm AAA on f/u CTAbd 4/16 & he has appt w/ DrBrabham...Marland KitchenMarland KitchenMarland Kitchen    Chol> on Lip20 now, Fish Oil & diet rx; FLP 9/15 (on Simva20) shows TChol 131, TG 72, HDL 50, LDL 67    Hx medullary thyroid cancer> on Synthroid100 now; followed by [facility] for [facility]CS; TFTs were wnl on Levothy150 w/ TSH 9/15 = 0.54 & FreeT3=3.0 (2.3-4.2) & FreeT4= 1.31 (0.6-1.60); dose decr to 187mcg/d by Cards due to PAF 4/16.    GI- Divertics, HxPolyps> on Align; last colon 2007 by DrSamLeB showed divertics, hems, no polyps & f/u suggested 37yrs but now >82y/o...    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    DJD, LBP> off Vicodin, on Tylenol; he developed LBP after lifting a mattress  5/13; eval by DrBrooks & DrHirsh w/ MRI reported to show HNP at L4-5 (w/ myelopathy & radiculopathy); DrHirsh did LumbarLam 1/14 w/ decompression L4 & L5 w/ diskectomy=> improved...    Neuro- TIA, Hx subdural hemtoma, Tremor/Parkinson's> now followed by DrAthar for Neuro on Sinemet25-100Qid & Azilect1mg /d- seen 04/2014 & he reports stable...    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  CXR 4/16 showed norm heart size, clear lungs, mult calcif mediastinal LNs (old gran dis), NAD...   2DEcho 4/16 showed mild LVH, norm LVF=60-65% & no regional wall  motion abn, Gr1DD, valves OK, LA=63mm, mod RV dil...   LABS 4/16:  Reviewed- Cr=1.3-1.6, sl elev troponins, TSH=0.532...  IMP/ PLAN>>  Brief episode of PAF, converted spont to NSR, started on Xarelto15 for elev risk (but has hx SDH as well) and mult other meds adjusted as noted;  He is feeling better overall & awaiting f/u appt w/ Cards- DrHochrein;  He had [facility] & his cryoablation zone lower pole right kidney is further sl improved but there was a 4cm AAA & he has appt w/ DrBrabham to follow this... He will need f/u FLP, BMet, & TSH on return...  ~  Aug 23, 2014:  43mo ROV & add-on for HBP & to review recent events> William [name] was [facility] 4/26 - 08/10/14 by [facility] w/ TIA after presenting w/ transient aphasia & confusion; he was already on Xarelto15 (due to PAF w/ CHADS-VASc=5 but prev hx subdural hematoma); CT Brain w/o acute changes (mild atrophy & sm vessel dis), subseq MRI/MRA was also neg w/o infarct or large vessel occlusion or stenosis (prev left craniotomy, right P2/3 mid grade stenosis); seen by Neurology & they rec adding ASA81/d;  He was also bradycardic & his Cardizem was stopped & ultimately Cozaar dose increased...  He had Cards f/u DrHochrein 4/29 & returned to the ER 5/5 w/ elevBP and ?slurring (vs his regular parkinson's voice)=>  His Xarelto & ASA were continued and  Cozaar changed to Hyzaar100-12.5.Marland KitchenMarland Kitchen    HBP> on Hyzaar100-12.5 & off Cardizem; BP=180/100 today & later down to 148/80; denies CP, palpit, SOB, edema; BP at home >200 & we decided to add HYDRALAZINE 25Tid.    PAF> adm 4/16 w/ PAF &rvr; converted spont to NSR & meds adjusted, disch on Xarelto15/d, holding NSR...    ASPVD> back on ASA 81 & Xarelto since his TIA 4/16; denies further cerebral ischemic symptoms etc; IR found ~4cm AAA on f/u CTAbd 4/16 & he has appt w/ DrBrabham......    Neuro- TIA, Hx subdural hemtoma, Tremor/Parkinson's> now followed by DrAthar for Neuro on Sinemet25-100Qid but they stopped Azilect1mg /d since [place] read that it can cause elevBP...    Hx medullary thyroid cancer> on Synthroid100 now; followed by [facility] for CCS; TFTs were wnl on Levothy150 w/ TSH 9/15 = 0.54 & FreeT3=3.0 (2.3-4.2) & FreeT4= 1.31 (0.6-1.60); dose decr to 132mcg/d by Cards due to PAF-4/16 We reviewed prob list, meds, xrays and labs> see below for updates >>   LABS 5/16:  TSH=2.94 PLAN>>  William [name] will continue the Hyzaar100-12.5 & add Hydralazine25tid while watching his BP;  They stopped his Azilect since [place] read that prolonged use can cause incrBP- he is asked to check w/ Neuro;  ROV 65mo to f/u BP on meds... TSH is wnl on Synthroid100- continue same.  ~  November 22, 2014:  66mo ROV & follow up of his BP- last OV we added Hydralazine25Tid to his regimen of Hyzaar100-12.5; tolerated well & BP is improved at 142/76, recheck 130/70-  w/o CP, palpit, dizzy, edema=> William [name] has esablished Primary Care closer to home [facility]) at [facility] office w/ Eliezer Lofts...            Problem List:  HYPERTENSION (ICD-401.9) - controlled on HYZAAR 100/25 daily, & CARDURA 8mg - taking 1/2 daily... ~  2/13:  BP= 122/60 & he denies CP, palpit, SOB, edema, etc... ~  8/13:  BP= 140/80 & he remains largely asymptomatic... ~  CXR 1/14 showed normal heart size, ectatic & calcif Ao, low lying diaph,  clear lungs, NAD... ~  EKG  1/14 showed NSR, rate61,  Poor R prog V1-3, NSSTTWA... ~  2/14: on Hyzaar100-12.5 & Cardura8; BP=138/80 today & denies CP, palpit, SOB, edema. ~  3/15: on Hyzaar100-12.5 & Cardura8-1/2; BP=120/58 today & he remains asymptomatic... ~  9/15: on Hyzaar100-12.5 & Cardura8-1/2; BP=110/70 today & denies CP, palpit, SOB, edema... ~  1/16: he presented w/ BP up to 200/100 at home; it was 132/84 here & we decided to incr Cardura to 8mg /d, recheck 3mo... ~  CXR 1/16 showed norm heart size, clear hyperinflated lungs, stable mediastinal calcif, NAD ~  4/16:  He was hosp this month w/ PAF> now on [place]; BP=120/70 today & denies CP, palpit, SOB, edema; subseq Cardizem stopped due to bradycardia & Losar incr to 50mg /d; then this was changed to Hyzaar100-12.5 ~  5/16:  BP still elev at home & office on Hyzaar100-12.5 now; BP= 180/100 & we decided to add HYDRALAZINE 25Tid...   PAF >> he was adm 4/3-07/2014 w/ PAF & rvr, converted w/ Cardizem drip & meds adjusted- disch on Xarelto15 & Cardizem60Bid, holding NSR & doing satis...  ATHEROSCLEROTIC VASCULAR DISEASE (ICD-440.9) - on ASA 81mg /d;  Prev Plavix was discontinued after his subdural hematoma in 2006. ~  Eval 2003 by [facility] for TIA showed cerebrovasc dis w/ R>L PCA stenoses... ~  4/15: f/u CT Abd (f/u cryoablation of renal oncocytoma) also showed 3.3cm AAA being followed... ~  4/16: f/u CT Abd by IR, DrYamagata showed further contraction of the cryoablation defect w/ other changes persisting & AAA now 3.9cm in size=> he's been referred to DrBrabham to establish care...  HYPERLIPIDEMIA (ICD-272.4) - on SIMVASTATIN 40mg -taking 1/2 + FISH OIL 1000mg /d> doing well. ~  FLP 12/07 on Zocor showed TChol 137, HDL 47, LDL 70 ~  FLP 7/09 off med showed TChol 196, TG 95, HDL 49, LDL 128... rec> restart Zocor 40mg . ~  FLP 7/10 showed TChol 135, TG 47, HDL 53, LDL 73... He decr dose to 1/2 tab on his own... ~  [place] 8/11 showed TChol 138, TG 73, HDL 49, LDL  75 ~  FLP 8/12 showed TChol 125, TG 69, HDL 56, LDL 56 ~  FLP 2/13 showed TChol 133, TG 69, HDL 61, LDL 59 ~  FLP 8/13 showed TChol 137, TG 73, HDL 53, LDL 70 ~  FLP 8/14 on Simva20 showed  TChol 128, TG 74, HDL 48, LDL 66 ~  FLP 9/15 on Simva20 showed TChol 131, TG 72, HDL 50, LDL 67  ~  4/16:  Now on Atorva20...  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 ~  labs 7/09 showed TSH= 3.47... on SYNTHROID 273mcg/d ~  labs 7/10 showed TSH= 2.95 ~  labs 8/11 showed TSH= 1.20 ~  Labs 8/12 showed TSH= 0.23 & pt rec to decr the Synthroid200 to only 1/2 on MWF.Marland Kitchen. ~  Labs 2/13 showed TSH= 2.29 on Synthroid200- taking 1x4d=TThSS and 1/2 x3d=MWF.Marland Kitchen. ~  Labs 8/13 showed TSH= 0.69... On same dose. ~  Labs 8/14 on Synthroid200-taking 1 alt w/ 1/2 qod showed TSH=0.54, Free T4=1.28 ~  3/15: we discussed change in Synthroid to 124mcg/d... ~  Labs 9/15 on Synthroid150 showed TSH = 0.54 & FreeT3=3.0 (2.3-4.2) & FreeT4= 1.31 (0.6-1.60) ~  4/16:  He is now on Synthroid 178mcg/d since hosp for PAF 4/16... TSH on Levothy100= 2.94, continue same.  COLONIC POLYPS (ICD-211.3) - last colonoscopy 1/07 by  DrSam showed divertics & hems only... f/u planned 76yrs.  BENIGN PROSTATIC HYPERTROPHY, WITH OBSTRUCTION (ICD-600.01) - he sees DrDavis for Urology once a year... he has a brother who had prostate cancer...  ~  PSA followed by [facility] yrearly... 7/10 pt reports PSA>5 (was 4.17 here) & Rx w/ Cipro... ~  he had f/u Urology eval DrGrapey 5/11- pt indicates that his PSA was OK... 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 ~  2/14:  followed by DrGrapey & DrYamagata re-imaged the lesion via CT 1/14 w/ growth evident & he has rec Cryoablation=> pending due to need for LLam first... ~  3/14:  he underwent cryoablation of right renal oncocytic tumor by DrYamagata (pt reports painful procedure) ~  5/14:  f/u visit w/ IR & CT Abd=> 2 left renal cysts & lower pole right renal cyst, cryoablation zone in inferior pole right kidney measures 4x3cm; atherosclerotic changes in Ao, mult splenic granulomata, prom CBD... ~  9/14: f/u visit w/ IR, DrYamagata> s/p percut cryoablation of right renal oncocytic tumor 3/14; doing well, CTshowed expected post ablation changes, no resid or recurrent tumor suspected...  ~  1/15: f/u visit w/ DrGrapey> doing well from the renal tumor standpoint, BPH, LTOS, ED, etc... ~  4/14: he had f/u DrYamagata> doing well, asymptomatic- BUN=17, Cr=1.21, f/u CT showed further retraction of right sided cryoablation defect, no abnormal enhancement, bilat renal cysts... ~  4/15: he had yearly f/u w/ DrYamagata> CT Abd showed further contraction of the cryoablation zone in the lower pole of the right kidney, no recurrent tumor, stable bilat renal cysts, chr right UPJ stenosis, 3.3cm AAA noted... ~  4/16: he had yearly f/u by IR> CT Abd showed post ablation changes w/ contraction of the cryoablation zone in lower pole of right kidney & no enhancement, mild infrarenal AAA ~4cm & he has appt w/ DrBrabham soon                TRANSIENT ISCHEMIC ATTACK (ICD-435.9) - hx of recurrent right brain TIA's in 2003... initially on ASA + Plavix and the Plavix was stopped after his  subdural in 2006... ~  [facility] 4/16 by [facility] w/ TIA> presented w/ aphasia & confusion=> rapidly cleared; Neuro rec adding back ASA81 to his [place]...  Hx of SUBDURAL HEMATOMA (ICD-432.1) - hosp 7/06 w/ left subdural hematoma req craniotomy by DrHirsh for evacuation... no known trauma- he was on ASA/ Plavix and developed a headache... he also take DILANTIN 100mg  Tid now per [facility] (for a partial seizure characterized by aphasia)... ~  7/10: pt tells me he has decreased his Dilantin to Bid and will f/u w/ DrLove. ~  8/11:  we don't have any recent notes from [facility], but pt is off the Dilantin rx.  TREMOR (ICD-781.0) - he notes  mild tremor right hand & arm> eval from Neurology- Gean Quint- indicated prob early [name] and after a period of observation they started Mirapex (stopped due to hypotension), changed to [facility] 2/12> dosed per DrLove. ~  8/11: pt tells me that he has started accupuncture treatments in [place]==> no benefit. ~  2/12:  pt indicates that DrLove stopped the Mirapex in favor of SINEMET 25/100- taking 1/2 Tid. ~  8/12:  Pt tells me he has seen DrScott, Neurology at [facility] (?referred by [facility], ?second opinion), we don't have records, pt reports no change in meds (he considered entering a drug study). ~  He continues to f/u w/ DrLove every 4-30months... ~  8/13:  He indicates that Drlove recently increased the SINEMET 25/100 to 1 tab Tid... ~  8/14:  He had Neuro f/u DrAthar> note reviewed & pt stable on Sinemet Tid + Azilect 1mg /d (added 5/14 by Neuro)...  DEGENERATIVE JOINT DISEASE (ICD-715.90) - he notes some knee discomfort & he takes Aleve & HYDROCODONE as needed... LBP >> injured back lifting mattress 5/13; eval by DrBrooks & DrHirsh w/ abn MRI (done by Ortho at their office) & disc herniation L4-5 by report; Pred w/o help, ESI helped some, using pain Rx & holding off on surg per DrHirsh... ~  1/14: on Vicodin prn; eval by DrBrooks & DrHirsh w/ MRI reported to show HNP at L4-5  (w/ myelopathy & radiculopathy); DrHirsh did LumbarLam 1/14 w/ decompression L4 & L5 w/ discectomy=> improved...  Hx of CARCINOMA, SKIN, SQUAMOUS CELL (ICD-173.9) - s/p removal of skin cancer from his right forearm 11/06 by DrLupton... ~  9/15: he reports another skin cancer removed...  Health Maintenance:  he had PNEUMOVAX vaccine in 1998 & 1/11... gets yearly Flu vaccine every fall... TDAP given 8/11...   Past Surgical History  Procedure Laterality Date  . Cataract extraction w/ intraocular lens  implant, bilateral Bilateral ~ 1998  . Cholecystectomy    . Thyroidectomy      medullary carcinoma  . Eye surgery    . Knee arthroscopy Right   . Appendectomy    . Tonsillectomy    . Lumbar laminectomy/decompression microdiscectomy  05/14/2012    Procedure: LUMBAR LAMINECTOMY/DECOMPRESSION MICRODISCECTOMY 1 LEVEL;  Surgeon: [name], MD;  Location: [place] [facility];  Service: Neurosurgery;  Laterality: Left;  Left Lumbar four-five Laminectomy/Diskectomy/Far lateral diskectomy  . Cryoablation  07-10-12    "tumor on cyst on my kidney; had an ablation"  . Skin cancer excision  "several"    "back of my neck; right eye; clavicle; nose"  . Inguinal hernia repair Right   . Back surgery    . Subdural hematoma evacuation via craniotomy  2006  . Kidney surgery      Outpatient Encounter Prescriptions as of 11/22/2014  Medication Sig  . apixaban (ELIQUIS) 2.5 MG TABS tablet Take 1 tablet (2.5 mg total) by mouth 2 (two) times daily.  Marland Kitchen aspirin EC 81 MG tablet Take 1 tablet (81 mg total) by mouth daily.  . carbidopa-levodopa (SINEMET IR) 25-100 MG per tablet Take 1 tablet by mouth 4 (four) times daily. At 8, 12, 5 PM, and 9 or 10 PM  . hydrALAZINE (APRESOLINE) 25 MG tablet Take 1 tablet (25 mg total) by mouth 3 (three) times daily.  Marland Kitchen levothyroxine (SYNTHROID) 100 MCG tablet Take 1 tablet (100 mcg total) by mouth daily before breakfast.  . losartan-hydrochlorothiazide (HYZAAR) 100-12.5 MG per tablet  Take 1 tablet by mouth daily.  . Multiple Vitamin (MULTIVITAMIN  WITH MINERALS) TABS Take 1 tablet by mouth daily.  . Probiotic Product (ALIGN) 4 MG CAPS Take 1 capsule by mouth daily.  . rasagiline (AZILECT) 1 MG TABS tablet Take 1 tablet (1 mg total) by mouth daily.  . simvastatin (ZOCOR) 20 MG tablet Take 1 tablet (20 mg total) by mouth at bedtime.  . tamsulosin (FLOMAX) 0.4 MG CAPS capsule Take 1 capsule (0.4 mg total) by mouth daily.   No facility-administered encounter medications on file as of 11/22/2014.    No Known Allergies   Current Medications, Allergies, Past Medical History, Past Surgical History, Family History, and Social History were reviewed in [employer] record.    Review of Systems       See HPI - all other systems neg except as noted...       The patient complains of dyspnea on exertion, right hand tremor, and difficulty walking.  The patient denies anorexia, fever, weight loss, weight gain, vision loss, decreased hearing, hoarseness, chest pain, syncope, peripheral edema, prolonged cough, headaches, hemoptysis, abdominal pain, melena, hematochezia, severe indigestion/heartburn, hematuria, incontinence, muscle weakness, suspicious skin lesions, transient blindness, depression, unusual weight change, abnormal bleeding, enlarged lymph nodes, and angioedema.     Objective:   Physical Exam     WD, WN, 80 y/o WM in NAD... GENERAL:  Alert & oriented; pleasant & cooperative... HEENT:  [place]/AT, EOM-wnl, PERRLA, EACs-clear, TMs-wnl, NOSE-clear, THROAT-clear & wnl. NECK:  Supple w/ fairROM; no JVD; normal carotid impulses w/o bruits; thyroid area scar, no nodules palp; no lymphadenopathy. CHEST:  Clear to P & A; without wheezes/ rales/ or rhonchi heard... HEART:  Regular Rhythm; without murmurs/ rubs/ or gallops detected... ABDOMEN:  Soft & nontender; normal bowel sounds; no organomegaly or masses palpated... BACK:  Scar of LLam surg... EXT: without  deformities, mild arthritic changes; no varicose veins/ +venous insuffic/ no edema. NEURO:  CN's intact;  mod tremor right hand & arm at rest, ?early mask facies, still moves well. DERM:  No lesions noted; no rash etc...  RADIOLOGY DATA:  Reviewed in the EPIC EMR & discussed w/ the patient...  LABORATORY DATA:  Reviewed in the EPIC EMR & discussed w/ the patient...   Assessment & Plan:    PAF>> Brief episode of PAF, converted spont to NSR, started on Xarelto15 for elev risk (but has hx SDH as well) and mult other meds adjusted as noted;  He is feeling better overall & awaiting f/u appt w/ Cards- DrHochrein;  He had f/u [facility]Abd by [facility] & his cryoablation zone lower pole right kidney is further sl improved but there was a 4cm AAA & he has appt w/ DrBrabham to follow this...  HBP>  Volatile BP changes=> Losartan increased, then changed to Hyzaar; then added HYDRALAZINE 25Tid & continue to monitor BP at home.  PVD- carotids and AAA>  Back on ASA now & Xarelto changed to Eliquis- w/o cerebral ischemic symptoms, & he has appt w/ DrBrabham...  HYPERLIPID>  On Atorva20 now + Fish Oil; FLP was good 9/15 on Simva20 at that time...  Hx Medullary Thyroid Carcinoma>  S/p surg by DrNewman2002, on Synthroid 100mg /d now & TSH = 2.94, continue same.  GI> remote hx polyp, divertics, hems>  Last colon 2007 by by DrSam & suggested f/u 38yrs...  GU>  Followed by DrGrapey now, RENAL LESION found incidentally on MRI Lumbar spine & w/u revealed oncocytoma w/ cryoablation per [facility] 3/14 & stable on f/u visits/ scans...  NEURO> Hx TIA, Hx SDH, Tremor/  Parkinson's>  Followed by Gean Quint & now DrAthar on Sinemet & Azilect, improved...  DJD, LBP>  He had decompressive LLam 1/14 by DrHirsh & is recovering nicely...  Other medical issues as noted...   Patient's Medications  New Prescriptions   No medications on file  Previous Medications   APIXABAN (ELIQUIS) 2.5 MG TABS TABLET    Take 1 tablet (2.5 mg  total) by mouth 2 (two) times daily.   ASPIRIN EC 81 MG TABLET    Take 1 tablet (81 mg total) by mouth daily.   CARBIDOPA-LEVODOPA (SINEMET IR) 25-100 MG PER TABLET    Take 1 tablet by mouth 4 (four) times daily. At 8, 12, 5 PM, and 9 or 10 PM   HYDRALAZINE (APRESOLINE) 25 MG TABLET    Take 1 tablet (25 mg total) by mouth 3 (three) times daily.   LEVOTHYROXINE (SYNTHROID) 100 MCG TABLET    Take 1 tablet (100 mcg total) by mouth daily before breakfast.   LOSARTAN-HYDROCHLOROTHIAZIDE (HYZAAR) 100-12.5 MG PER TABLET    Take 1 tablet by mouth daily.   MULTIPLE VITAMIN (MULTIVITAMIN WITH MINERALS) TABS    Take 1 tablet by mouth daily.   PROBIOTIC PRODUCT (ALIGN) 4 MG CAPS    Take 1 capsule by mouth daily.   RASAGILINE (AZILECT) 1 MG TABS TABLET    Take 1 tablet (1 mg total) by mouth daily.   SIMVASTATIN (ZOCOR) 20 MG TABLET    Take 1 tablet (20 mg total) by mouth at bedtime.   TAMSULOSIN (FLOMAX) 0.4 MG CAPS CAPSULE    Take 1 capsule (0.4 mg total) by mouth daily.  Modified Medications   No medications on file  Discontinued Medications   No medications on file

## 2014-12-06 DIAGNOSIS — N400 Enlarged prostate without lower urinary tract symptoms: Secondary | ICD-10-CM | POA: Diagnosis not present

## 2014-12-08 ENCOUNTER — Ambulatory Visit (INDEPENDENT_AMBULATORY_CARE_PROVIDER_SITE_OTHER): Payer: Commercial Managed Care - HMO | Admitting: Cardiology

## 2014-12-08 ENCOUNTER — Encounter: Payer: Self-pay | Admitting: Cardiology

## 2014-12-08 VITALS — BP 144/80 | HR 47 | Ht 76.0 in | Wt 177.0 lb

## 2014-12-08 DIAGNOSIS — I480 Paroxysmal atrial fibrillation: Secondary | ICD-10-CM

## 2014-12-08 NOTE — Progress Notes (Signed)
Cardiology Office Note   Date:  12/08/2014   ID:  William [name], DOB 1928-08-26, MRN [id number]  PCP:  Eliezer Lofts, MD  Cardiologist:   Minus Breeding, MD   Chief Complaint  Patient presents with  . 4 months    Patient has no complaints.      History of Present Illness: William [name] is a 81 y.o. male who presents for evaluation of atrial fibrillation.  He has had paroxysmal atrial fibrillation. He's had a TIA as well.  Since I last saw him his blood pressure was elevated but Dr. Lenna Gilford treated this with the addition of hydralazine has his BP is well controlled.  He says that he is doing very well.  The patient denies any new symptoms such as chest discomfort, neck or arm discomfort. There has been no new shortness of breath, PND or orthopnea. There have been no reported palpitations, presyncope or syncope.  He did have a cough about 4 weeks ago and actually called EMS because of some chest pain. However, this was felt to be musculoskeletal. Follow-up troponin was normal and chest x-ray was normal. He's had no further chest discomfort.  Past Medical History  Diagnosis Date  . Hyperlipidemia   . Hx of colonic polyps   . Benign prostatic hypertrophy with urinary obstruction   . TIA (transient ischemic attack)     pt does not recall this hx "but may have had one today" (08/09/2014)  . Subdural hematoma 2006  . Gout   . Hypertension     sees [name]  . Thyroid disease     medullary thyroid   . Pneumonia     hx of in 1952  . Parkinson disease   . A-fib   . Complication of anesthesia     "difficulty intubation, had to use fiberoptic 2006"  . Difficult intubation   . Malignant neoplasm of thyroid gland     medullary carcinoma  . Primary skin squamous cell carcinoma   . Basal cell carcinoma   . Hypothyroidism   . DJD (degenerative joint disease)   . Arthritis     "right knee" (08/09/2014)  . Gout     "periodically" (08/09/2014)  . Kidney tumor     "tumor on  cyst on kidney"   . DJD (degenerative joint disease)   . Parkinson disease     Past Surgical History  Procedure Laterality Date  . Cataract extraction w/ intraocular lens  implant, bilateral Bilateral ~ 1998  . Cholecystectomy    . Thyroidectomy      medullary carcinoma  . Eye surgery    . Knee arthroscopy Right   . Appendectomy    . Tonsillectomy    . Lumbar laminectomy/decompression microdiscectomy  05/14/2012    Procedure: LUMBAR LAMINECTOMY/DECOMPRESSION MICRODISCECTOMY 1 LEVEL;  Surgeon: [name], MD;  Location: [facility];  Service: Neurosurgery;  Laterality: Left;  Left Lumbar four-five Laminectomy/Diskectomy/Far lateral diskectomy  . Cryoablation  07-10-12    "tumor on cyst on my kidney; had an ablation"  . Skin cancer excision  "several"    "back of my neck; right eye; clavicle; nose"  . Inguinal hernia repair Right   . Back surgery    . Subdural hematoma evacuation via craniotomy  2006  . Kidney surgery       Current Outpatient Prescriptions  Medication Sig Dispense Refill  . apixaban (ELIQUIS) 2.5 MG TABS tablet Take 1 tablet (2.5 mg total) by mouth 2 (  two) times daily. 180 tablet 3  . aspirin EC 81 MG tablet Take 1 tablet (81 mg total) by mouth daily. 30 tablet 0  . carbidopa-levodopa (SINEMET IR) 25-100 MG per tablet Take 1 tablet by mouth 4 (four) times daily. At 8, 12, 5 PM, and 9 or 10 PM 360 tablet 3  . hydrALAZINE (APRESOLINE) 25 MG tablet Take 1 tablet (25 mg total) by mouth 3 (three) times daily. 90 tablet 3  . levothyroxine (SYNTHROID) 100 MCG tablet Take 1 tablet (100 mcg total) by mouth daily before breakfast. 90 tablet 2  . losartan-hydrochlorothiazide (HYZAAR) 100-12.5 MG per tablet Take 1 tablet by mouth daily. 90 tablet 3  . Multiple Vitamin (MULTIVITAMIN WITH MINERALS) TABS Take 1 tablet by mouth daily.    . Probiotic Product (ALIGN) 4 MG CAPS Take 1 capsule by mouth daily.    . rasagiline (AZILECT) 1 MG TABS tablet Take 1 tablet (1 mg total) by  mouth daily. 90 tablet 3  . simvastatin (ZOCOR) 20 MG tablet Take 1 tablet (20 mg total) by mouth at bedtime. 90 tablet 3  . [DISCONTINUED] losartan (COZAAR) 50 MG tablet Take 1 tablet (50 mg total) by mouth daily. 30 tablet 1   No current facility-administered medications for this visit.    Allergies:   Review of patient's allergies indicates no known allergies.    ROS:  Please see the history of present illness.   Otherwise, review of systems are positive for none.   All other systems are reviewed and negative.    PHYSICAL EXAM: VS:  BP 144/80 mmHg  Pulse 47  Ht 6\' 4"  (1.93 m)  Wt 177 lb (80.287 kg)  BMI 21.55 kg/m2 , BMI Body mass index is 21.55 kg/(m^2). GENERAL:  Well appearing HEENT:  Pupils equal round and reactive, fundi not visualized, oral mucosa unremarkable NECK:  No jugular venous distention, waveform within normal limits, carotid upstroke brisk and symmetric, no bruits, no thyromegaly LYMPHATICS:  No cervical, inguinal adenopathy LUNGS:  Clear to auscultation bilaterally BACK:  No CVA tenderness CHEST:  Unremarkable HEART:  PMI not displaced or sustained,S1 and S2 within normal limits, no S3, no S4, no clicks, no rubs, no murmurs ABD:  Flat, positive bowel sounds normal in frequency in pitch, no bruits, no rebound, no guarding, no midline pulsatile mass, no hepatomegaly, no splenomegaly EXT:  2 plus pulses throughout, no edema, no cyanosis no clubbing SKIN:  No rashes no nodules NEURO:  Motor grossly intact throughout, resting tremor, mild facial droop left side PSYCH:  Cognitively intact, oriented to person place and time   Recent Labs: 08/18/2014: ALT 5* 08/23/2014: TSH 2.94 11/04/2014: BUN 18; Creat 1.43*; Hemoglobin 14.5; Platelets 153; Potassium 4.1; Sodium 141    Lipid Panel    Component Value Date/Time   CHOL 124 08/10/2014 0354   TRIG 121 08/10/2014 0354   TRIG 101 03/25/2006 0935   HDL 44 08/10/2014 0354   CHOLHDL 2.8 08/10/2014 0354   CHOLHDL 2.9  CALC 03/25/2006 0935   VLDL 24 08/10/2014 0354   LDLCALC 56 08/10/2014 0354      Wt Readings from Last 3 Encounters:  12/08/14 177 lb (80.287 kg)  11/22/14 178 lb 9.6 oz (81.012 kg)  11/04/14 181 lb (82.101 kg)      Other studies Reviewed: Additional studies/ records that were reviewed today include:   Hospital records. Review of the above records demonstrates:  Please see elsewhere in the note.     ASSESSMENT AND PLAN:  ATRIAL FIB:  The patient has a CHA2DS2 - VASc score of 5 with a risk of stroke of 6.7%. At this point he will continue with the meds as listed. No change in therapy is indicated.  I had a long discussion with the patient and his wife about combination of aspirin. He was started on this at the time of the TIA but at that time he was having significant labile blood pressures which are now corrected. He has a history of subdural hematoma Parkinson's. I think the risk of combination therapy is not insignificant and so I have opted to stop the aspirin.  ELEVATED TROPONIN:  This was mildly elevated in the face of his A. fib but he had a negative stress test. No further testing is indicated.  HTN:  His blood pressure is now well controlled. No change in therapy is indicated.  Current medicines are reviewed at length with the patient today.  The patient does not have concerns regarding medicines.  The following changes have been made:  As above  Labs/ tests ordered today include: None  No orders of the defined types were placed in this encounter.     Disposition:   FU with me in 12 months.    Signed, Minus Breeding, MD  12/08/2014 11:59 AM    [facility]

## 2014-12-08 NOTE — Patient Instructions (Signed)
Your physician has recommended you make the following change in your medication: STOP aspirin  Your physician wants you to follow-up in: 1 year with Dr. Percival Spanish. You will receive a reminder letter in the mail two months in advance. If you don't receive a letter, please call our office to schedule the follow-up appointment.

## 2014-12-22 ENCOUNTER — Other Ambulatory Visit: Payer: Self-pay | Admitting: Neurology

## 2014-12-22 ENCOUNTER — Ambulatory Visit: Payer: Commercial Managed Care - HMO

## 2014-12-22 ENCOUNTER — Ambulatory Visit: Payer: Commercial Managed Care - HMO | Attending: Pulmonary Disease | Admitting: Physical Therapy

## 2014-12-22 ENCOUNTER — Ambulatory Visit: Payer: Commercial Managed Care - HMO | Admitting: Occupational Therapy

## 2014-12-22 DIAGNOSIS — R471 Dysarthria and anarthria: Secondary | ICD-10-CM | POA: Insufficient documentation

## 2014-12-22 DIAGNOSIS — R1310 Dysphagia, unspecified: Secondary | ICD-10-CM | POA: Insufficient documentation

## 2014-12-22 DIAGNOSIS — R258 Other abnormal involuntary movements: Secondary | ICD-10-CM | POA: Insufficient documentation

## 2014-12-22 DIAGNOSIS — R269 Unspecified abnormalities of gait and mobility: Secondary | ICD-10-CM | POA: Insufficient documentation

## 2014-12-22 DIAGNOSIS — G20 Parkinson's disease: Secondary | ICD-10-CM

## 2014-12-22 DIAGNOSIS — R29898 Other symptoms and signs involving the musculoskeletal system: Secondary | ICD-10-CM | POA: Insufficient documentation

## 2014-12-22 DIAGNOSIS — R490 Dysphonia: Secondary | ICD-10-CM | POA: Insufficient documentation

## 2014-12-22 DIAGNOSIS — R279 Unspecified lack of coordination: Secondary | ICD-10-CM

## 2014-12-22 DIAGNOSIS — R2681 Unsteadiness on feet: Secondary | ICD-10-CM | POA: Insufficient documentation

## 2014-12-22 NOTE — Therapy (Signed)
[facility] [address] [place], [name], [address] Phone: [phone]   Fax:  [phone]  Patient Details  Name: William [name] MRN: [id number] Date of Birth: 1928/08/14 Referring Provider:  Star Age, MD  Encounter Date: 12/22/2014  Speech Therapy [name] Screen  Decibel Level today: 68dB  (WNL=70-72 dB) with sound level meter 30cm away from pt's mouth Pt's conversational volume has decreased since last seen in the clinic. Pt demonstrated somewhat "wet" voice during screen, suggesting possible difficulty in swallowing function. No overt s/s aspiration PNA were reported to SLP today. Pt's speech was mildly and intermittently slurred.   Pt would benefit from the following speech therapy recommendations: Speech-language eval   [facility] 12/22/2014, 9:27 AM  [facility] [address] [place], [name], [address] Phone: [phone]   Fax:  [phone]

## 2014-12-22 NOTE — Progress Notes (Signed)
As per initial eval through interdisciplinary PD screening visit: referrals placed for neuro rehab PT/OT/ST.

## 2014-12-22 NOTE — Therapy (Signed)
[facility] [address] [place], [name], [address] Phone: [phone]   Fax:  [phone]  Patient Details  Name: William [name] MRN: [id number] Date of Birth: 1928-10-16 Referring Provider:  [name], MD  Encounter Date: 12/22/2014 Physical Therapy [name] Screen   Timed Up and Go test:13.09 sec  10 meter walk test:10.01 seconds   5 time sit to stand test:14.63 sec  Patient would benefit from Physical Therapy evaluation due to slowed functional mobility, complaints of decreased balance and decreased strength.  Recommend follow up with physical therapy evaluation.   William [name] W. 12/22/2014, 8:36 AM  Frazier Butt., PT  [facility] [address] [place], [name], [address] Phone: [phone]   Fax:  [phone]

## 2014-12-22 NOTE — Therapy (Signed)
[name], [name], [address] Phone: [phone]   Fax:  [phone]  Patient Details  Name: William [name] MRN: [id number] Date of Birth: 1929/02/17 Referring Provider:  [name], MD  Encounter Date: 12/22/2014     Occupational Therapy [name] Screen  Physical Performance Test item #2 (simulated eating):  14.28 sec  Physical Performance Test item #4 (donning/doffing jacket):  23.44sec  9-hole peg test:    RUE  48.38sec        LUE  34.75sec  Change in ability to perform ADLs/IADLs:  Using L hand more (R-hand dominant), difficulty with buttons, difficulty with writing, incr time for ADLs.  Other Comments:  Pt reports a-fib and TIA 6/16 since last time he has been to speech therapy.  Pt would benefit from occupational therapy evaluation due to  incr time for ADLs, difficulty with buttons, writing.       Vianne Bulls, OTR/L 12/22/2014 12:09 PM     [facility] [address] [place], [name], [address] Phone: [phone]   Fax:  [phone]

## 2015-01-10 ENCOUNTER — Ambulatory Visit: Payer: Commercial Managed Care - HMO | Admitting: Occupational Therapy

## 2015-01-10 ENCOUNTER — Ambulatory Visit: Payer: Commercial Managed Care - HMO | Admitting: Physical Therapy

## 2015-01-10 ENCOUNTER — Ambulatory Visit: Payer: Commercial Managed Care - HMO

## 2015-01-10 DIAGNOSIS — R2681 Unsteadiness on feet: Secondary | ICD-10-CM | POA: Diagnosis not present

## 2015-01-10 DIAGNOSIS — R279 Unspecified lack of coordination: Secondary | ICD-10-CM | POA: Diagnosis not present

## 2015-01-10 DIAGNOSIS — R1310 Dysphagia, unspecified: Secondary | ICD-10-CM | POA: Diagnosis not present

## 2015-01-10 DIAGNOSIS — R29898 Other symptoms and signs involving the musculoskeletal system: Secondary | ICD-10-CM | POA: Diagnosis not present

## 2015-01-10 DIAGNOSIS — R258 Other abnormal involuntary movements: Secondary | ICD-10-CM

## 2015-01-10 DIAGNOSIS — R471 Dysarthria and anarthria: Secondary | ICD-10-CM

## 2015-01-10 DIAGNOSIS — R269 Unspecified abnormalities of gait and mobility: Secondary | ICD-10-CM | POA: Diagnosis not present

## 2015-01-10 DIAGNOSIS — R490 Dysphonia: Secondary | ICD-10-CM | POA: Diagnosis not present

## 2015-01-10 NOTE — Therapy (Signed)
[facility] [address] [place], [name], [address] Phone: [phone]   Fax:  [phone]  Occupational Therapy Evaluation  Patient Details  Name: William [name] MRN: [id number] Date of Birth: 1928-06-27 Referring Provider:  Jinny Sanders, MD  Encounter Date: 01/10/2015      OT End of Session - 01/10/15 1652    Visit Number 1   Number of Visits 17   Date for OT Re-Evaluation 03/11/15   Authorization Type [employer], no visit limit, no auth required   Authorization - Visit Number 1   Authorization - Number of Visits 10  G   OT Start Time 2094   OT Stop Time 1100   OT Time Calculation (min) 45 min   Activity Tolerance Patient tolerated treatment well   Behavior During Therapy [facility] for tasks assessed/performed      Past Medical History  Diagnosis Date  . Hyperlipidemia   . Hx of colonic polyps   . Benign prostatic hypertrophy with urinary obstruction   . TIA (transient ischemic attack)     pt does not recall this hx "but may have had one today" (08/09/2014)  . Subdural hematoma 2006  . Gout   . Hypertension     sees [name]  . Thyroid disease     medullary thyroid   . Pneumonia     hx of in 1952  . Parkinson disease   . A-fib   . Complication of anesthesia     "difficulty intubation, had to use fiberoptic 2006"  . Difficult intubation   . Malignant neoplasm of thyroid gland     medullary carcinoma  . Primary skin squamous cell carcinoma   . Basal cell carcinoma   . Hypothyroidism   . DJD (degenerative joint disease)   . Arthritis     "right knee" (08/09/2014)  . Gout     "periodically" (08/09/2014)  . Kidney tumor     "tumor on cyst on kidney"   . DJD (degenerative joint disease)   . Parkinson disease     Past Surgical History  Procedure Laterality Date  . Cataract extraction w/ intraocular lens  implant, bilateral Bilateral ~ 1998  . Cholecystectomy    . Thyroidectomy     medullary carcinoma  . Eye surgery    . Knee arthroscopy Right   . Appendectomy    . Tonsillectomy    . Lumbar laminectomy/decompression microdiscectomy  05/14/2012    Procedure: LUMBAR LAMINECTOMY/DECOMPRESSION MICRODISCECTOMY 1 LEVEL;  Surgeon: [name], MD;  Location: [facility];  Service: Neurosurgery;  Laterality: Left;  Left Lumbar four-five Laminectomy/Diskectomy/Far lateral diskectomy  . Cryoablation  07-10-12    "tumor on cyst on my kidney; had an ablation"  . Skin cancer excision  "several"    "back of my neck; right eye; clavicle; nose"  . Inguinal hernia repair Right   . Back surgery    . Subdural hematoma evacuation via craniotomy  2006  . Kidney surgery      There were no vitals filed for this visit.  Visit Diagnosis:  Bradykinesia  Lack of coordination  Rigidity  Unsteadiness      Subjective Assessment - 01/10/15 1020    Subjective  "Pt reports R hand 'freezes' at times"   Pertinent History PD (dx 2009), 07/2014 a-fib, TIA, hx of R craniotomy (11/2004), TIA (2003), thyroid CA, partial onset seizures   Patient Stated Goals improve ADLs and R hand use   Currently  in Pain? No/denies           [facility] OT Assessment - 01/10/15 1020    Assessment   Diagnosis [name]   Onset Date --  2010, screen 12/22/14 indicated decline in ADLs/coordination   Prior Therapy no previous OT   Precautions   Precautions Fall   Balance Screen   Has the patient fallen in the past 6 months Yes   How many times? 1  fell out of chair    Home  Environment   Family/patient expects to be discharged to: Private residence   Lives With Significant other   Prior Function   Level of Independence Independent with basic ADLs;Independent with community mobility without device   Leisure Enjoys yardwork; plays golf 2x/wk; goes to gym (not regularly)-rides bike at gym   ADL   Eating/Feeding --  spills (during off times)   Grooming Modified independent  difficulty trimming  nails   Upper Body Bathing --  incr time   Lower Body Bathing Increased time   Upper Body Dressing Increased time  difficulty putting on fit bit, fasteners (occasional assist)   Upper Body Dressing Patient Percentage --  difficulty donning tie   Lower Body Dressing Increased time  difficulty, occasional assist with fasteners   Toilet Tranfer Modified independent   Toileting - Clothing Manipulation Increased time;Modified independent   Toileting -  Hygiene Increase time;Modified Independent   Tub/Shower Transfer Modified independent  shower stall, grab bars, has seat but stands   ADL comments pt reports difficulty peeling/chopping, difficulty using screwdriver   IADL   Shopping Takes care of all shopping needs independently   Prior Level of Function Light Housekeeping cleaning lady does heavy cleaning    Light Housekeeping Performs light daily tasks such as dishwashing, bed making   Meal Prep --  unable to carry glass in R hand due to spills   Community Mobility Drives own vehicle   Medication Management Is responsible for taking medication in correct dosages at correct time   Physiological scientist financial matters independently (budgets, writes checks, pays rent, bills goes to bank), collects and keeps track of income   Mobility   Mobility Status Independent   Written Expression   Dominant Hand Right   Handwriting 90% legible;Mild micrographia;Increased time   Vision - History   Baseline Vision Wears glasses only for reading   Additional Comments pt denies changes   Activity Tolerance   Activity Tolerance Comments fatigues quickly, needs nap after physical activity   Cognition   Overall Cognitive Status Cognition to be further assessed in functional context PRN  [facility] for tasks performed   Observation/Other Assessments   Standing Functional Reach Test R-10", L-8.5"   Other Surveys  Select   Outcome Measures Unbuttoning/buttoning 3 buttons on tabletop in 83.03sec.    Physical Performance Test   Yes   Simulated Eating Time (seconds) 15.01   Donning Doffing Jacket Time (seconds) 11.47   Coordination   9 Hole Peg Test Right;Left   Right 9 Hole Peg Test 60.44   Left 9 Hole Peg Test 34.94   Box and Blocks R-31blocks, L-37blocks   Tremors RUE resting tremors.  Pt reports that they are well controlled during "on" times, but is experiencing off times.  Pt reports that if he is carrying glass in RUE he spills, but mostly at rest.  Pt also has jaw tremor noted when concentrating   Tone   Assessment Location Right Upper Extremity;Left Upper Extremity   ROM /  Strength   AROM / PROM / Strength AROM   AROM   Overall AROM  Within functional limits for tasks performed   RUE Tone   RUE Tone --  mild to mod   LUE Tone   LUE Tone Mild                           OT Short Term Goals - 01/10/15 1703    OT SHORT TERM GOAL #1   Title Pt will be independent with PD-specific HEP.--check STGs 02/08/15   Time 4   Period Weeks   Status New   OT SHORT TERM GOAL #2   Title Pt will be able to write at least 3 sentences with 100% legibility and no significant decrease in size.   Baseline 90%, min decr in size   Time 4   Period Weeks   Status New   OT SHORT TERM GOAL #3   Title Pt will improve coordination for ADLs as shown by improving time on 9-hole peg test by at least 8sec with RUE.   Baseline 60.44   Time 4   Period Weeks   Status New   OT SHORT TERM GOAL #4   Title Pt will improve ability to eat as shown by completing PPT#2 in 12sec or less   Baseline 15.01   Time 4   Period Weeks   Status New   OT SHORT TERM GOAL #5   Title Pt will improve ability to button as shown by ability to button/unbutton 3 buttons with shirt on tabletop in 70sec or less.   Baseline 83.03   Time 4   Period Weeks   Status New   Additional Short Term Goals   Additional Short Term Goals Yes   OT SHORT TERM GOAL #6   Title Pt will verbalize understanding of ways  to prevent future complications and appropriate PD-related community resources.   Time 4   Period Weeks   Status New           OT Long Term Goals - 01/10/15 1707    OT LONG TERM GOAL #1   Title Pt will verbalize understanding of adaptive strategies/AE to incr ease/efficiency with ADLs/IADLs prn.   Time 8   Period Weeks   Status New   OT LONG TERM GOAL #2   Title Pt will be able to carry glass of liquid at least 49feet without spills x2.   Time 8   Period Weeks   Status New   OT LONG TERM GOAL #3   Title Pt will improve coordination for ADLs as shown by improving time on 9-hole peg test by at least 15sec with RUE.   Baseline 60.44   Time 8   Period Weeks   Status New   OT LONG TERM GOAL #4   Title Pt will improve coordination/functional reaching for ADLs as shown by improving score on box and blocks test by at least 6 bilaterally.   Baseline R-31, L-37 blocks   Time 8   Period Weeks   Status New   OT LONG TERM GOAL #5   Title Pt will improve balance/functional reaching for ADLs as shown by improving standing functional reach test to at least 10" with LUE.   Time 8   Period Weeks   Status New   Long Term Additional Goals   Additional Long Term Goals Yes   OT LONG TERM GOAL #6   Title Pt  will improve ability to button as shown by ability to button/unbutton 3 buttons with shirt on tabletop in 60sec or less.   Baseline 55sec   Time 8   Period Weeks   Status New               Plan - 23-Jan-2015 1653    Clinical Impression Statement Pt is an 81 y.o. male diagnosed with PD in 2009.  Pt with hx of craniotomy (2006), hx of multiple TIAs, thyroid CA, partial onset seizures, and was diagnosed with A-fib and another TIA (08/2014).  Pt presents today with decreased dominant RUE functional use, decr coordination, rigidity, bradykinesia, decr balance for ADLs.  Pt would benefit from occupational therapy to address these deficit to improve quality of life, prevent future  complications, initiate PD-specific HEP, and improve ADL performance and RUE functional use.   Pt will benefit from skilled therapeutic intervention in order to improve on the following deficits (Retired) Decreased mobility;Impaired UE functional use;Decreased knowledge of use of DME;Decreased balance;Decreased activity tolerance;Impaired tone;Decreased coordination  bradykinesia   Rehab Potential Good   OT Frequency 2x / week   OT Duration 8 weeks  +eval   OT Treatment/Interventions Self-care/ADL training;Cryotherapy;Moist Heat;Passive range of motion;Fluidtherapy;Therapeutic activities;DME and/or AE instruction;Therapeutic exercises;Splinting;Manual Therapy;Neuromuscular education;Ultrasound;Therapeutic exercise;Functional Mobility Training;Patient/family education   Plan initiate [facility]! HEP   Consulted and Agree with Plan of Care Patient          G-Codes - 01/23/2015 1722    Functional Assessment Tool Used R 9-hole peg test: 60.44sec, 3 button/unbutton on table in 83.03sec, box and blocks:  R-31, L-37 blocks   Functional Limitation Carrying, moving and handling objects   Carrying, Moving and Handling Objects Current Status (D6222) At least 40 percent but less than 60 percent impaired, limited or restricted   Carrying, Moving and Handling Objects Goal Status (L7989) At least 20 percent but less than 40 percent impaired, limited or restricted      Problem List Patient Active Problem List   Diagnosis Date Noted  . HLD (hyperlipidemia)   . TIA (transient ischemic attack) 08/09/2014  . Chronic kidney disease 08/09/2014  . Atrial fibrillation 08/09/2014  . Right renal mass   . Atrial fibrillation with rapid ventricular response 07/17/2014  . Elevated troponin 07/17/2014  . Diverticulosis of colon without hemorrhage 06/16/2013  . Post-operative hypothyroidism 06/16/2013  . Renal oncocytoma 06/09/2012  . LBP (low back pain) 06/09/2012  . Parkinson disease, symptomatic 06/06/2011  .  CARCINOMA, SKIN, SQUAMOUS CELL 12/10/2007  . MALIGNANT NEOPLASM OF THYROID GLAND 12/10/2007  . SUBDURAL HEMATOMA 12/10/2007  . BPH (benign prostatic hypertrophy) 12/10/2007  . Essential hypertension 04/22/2007  . Transient cerebral ischemia 04/22/2007  . Generalized atherosclerosis 04/22/2007  . Osteoarthritis 04/22/2007    [facility] 01/23/2015, 5:25 PM  [facility] [address] [place], [name], [address] Phone: [phone]   Fax:  [place], OTR/L 01-23-2015 5:25 PM

## 2015-01-10 NOTE — Patient Instructions (Signed)
   10 loud "HEY!" twice a day Read out loud with louder voice, for 5 minutes, twice a day

## 2015-01-10 NOTE — Therapy (Signed)
[facility] [address] [place], [name], [address] Phone: [phone]   Fax:  [phone]  Speech Language Pathology Evaluation  Patient Details  Name: William [name] MRN: [id number] Date of Birth: 01-08-1929 Referring Provider:  Jinny Sanders, MD  Encounter Date: 01/10/2015      End of Session - 01/10/15 1053    Visit Number 1   Number of Visits 17   Authorization Type humana - sent to LL 01-10-15   SLP Start Time 0807   SLP Stop Time  0848   SLP Time Calculation (min) 41 min   Activity Tolerance Patient tolerated treatment well      Past Medical History  Diagnosis Date  . Hyperlipidemia   . Hx of colonic polyps   . Benign prostatic hypertrophy with urinary obstruction   . TIA (transient ischemic attack)     pt does not recall this hx "but may have had one today" (08/09/2014)  . Subdural hematoma 2006  . Gout   . Hypertension     sees [name]  . Thyroid disease     medullary thyroid   . Pneumonia     hx of in 1952  . Parkinson disease   . A-fib   . Complication of anesthesia     "difficulty intubation, had to use fiberoptic 2006"  . Difficult intubation   . Malignant neoplasm of thyroid gland     medullary carcinoma  . Primary skin squamous cell carcinoma   . Basal cell carcinoma   . Hypothyroidism   . DJD (degenerative joint disease)   . Arthritis     "right knee" (08/09/2014)  . Gout     "periodically" (08/09/2014)  . Kidney tumor     "tumor on cyst on kidney"   . DJD (degenerative joint disease)   . Parkinson disease     Past Surgical History  Procedure Laterality Date  . Cataract extraction w/ intraocular lens  implant, bilateral Bilateral ~ 1998  . Cholecystectomy    . Thyroidectomy      medullary carcinoma  . Eye surgery    . Knee arthroscopy Right   . Appendectomy    . Tonsillectomy    . Lumbar laminectomy/decompression microdiscectomy  05/14/2012    Procedure: LUMBAR  LAMINECTOMY/DECOMPRESSION MICRODISCECTOMY 1 LEVEL;  Surgeon: [name], MD;  Location: [facility];  Service: Neurosurgery;  Laterality: Left;  Left Lumbar four-five Laminectomy/Diskectomy/Far lateral diskectomy  . Cryoablation  07-10-12    "tumor on cyst on my kidney; had an ablation"  . Skin cancer excision  "several"    "back of my neck; right eye; clavicle; nose"  . Inguinal hernia repair Right   . Back surgery    . Subdural hematoma evacuation via craniotomy  2006  . Kidney surgery      There were no vitals filed for this visit.  Visit Diagnosis: Dysarthria  Dysphagia      Subjective Assessment - 01/10/15 0811    Subjective "I need to know a few exercises I can do (to assist my speech)."   Currently in Pain? No/denies            SLP Evaluation [facility] - 01/10/15 4163    SLP Visit Information   SLP Received On 01/10/15   Medical Diagnosis [name]   Subjective   Subjective "I notice I have to focus on being louder."   Patient/Family Stated Goal Improve speech/voice   General Information  Other Pertinent Information Pt with TIA in early May 2016. He denies choking/coughing during meals. Admits to pill dysphagia. Pt with hydrophonic voice occasionally, swalllows spontaneously.   Oral Motor/Sensory Function   Overall Oral Motor/Sensory Function Impaired   Labial ROM Within Functional Limits   Labial Strength Within Functional Limits   Labial Coordination Reduced   Lingual ROM Reduced right;Reduced left  in top lateral sulci   Lingual Symmetry Within Functional Limits   Lingual Strength Within Functional Limits   Lingual Coordination Reduced   Facial Symmetry Left droop  mild   Velum Within Functional Limits   Motor Speech   Overall Motor Speech Impaired   Phonation Hoarse  suspect due to breath control   Articulation Impaired   Level of Impairment Sentence   Intelligibility Intelligible   Motor Planning Witnin functional limits   Effective  Techniques Increased vocal intensity                         SLP Education - 01/10/15 1052    Education provided Yes   Education Details loud "Hey!"and reading out loud for HEP, need for modified   Person(s) Educated Patient   Methods Explanation;Demonstration;Verbal cues   Comprehension Verbalized understanding;Returned demonstration;Verbal cues required;Need further instruction          SLP Short Term Goals - 01/10/15 1102    SLP SHORT TERM GOAL #1   Title pt to maintain loud /a/ average 82dB over 3 sessions   Time 4   Period Weeks   Status New   SLP SHORT TERM GOAL #2   Title pt will maintain volume of average 70dB in 12 minutes simple/mod complex conversation over two sessions   Time 4   Period Weeks   Status New   SLP SHORT TERM GOAL #3   Title (if necessary) pt will complete swallowing HEP following possible modified barium swallow exam with rare min A   Time 4   Period Weeks   Status New          SLP Long Term Goals - 01/10/15 1103    SLP LONG TERM GOAL #1   Title pt will maintain loud /a/ average 83dB over 4 sessions   Time 8   Period Weeks   Status New   SLP LONG TERM GOAL #2   Title pt will engage in mod complex conversation of 10 minutes with average 71dB with rare min A   Time 8   Status New   SLP LONG TERM GOAL #3   Title (if necessary) pt will complete swallowing HEP with modifed independence   Time 8   Period Weeks   Status New   SLP LONG TERM GOAL #4   Title pt will tell SLP 3 s/s aspiration PNA with modified independence   Time 8   Period Weeks   Status New          Plan - 01/10/15 1055    Clinical Impression Statement Pt presents with hoarse/harsh voice with mild dysarthria due to imprecise consonants. Pt's average dB in conversation is 68dB (lower than WNL), measured on sound level meter 30 cm from speaker. He required assistance for loud /a/ for reducing hyperfunctional voice use, so SLP trained him for loud "Hey!"  instead. Pt reports people are beginning to ask him to repeat, and he knows he needs to speak louder. When asked to use the same effort for speech as with loud "Hey!", pt raised volume to  average 72dB.  Pt's voice retained its harsh and hoarse quality even when pt speaking at louder volume, which does not normally occur. A referral to ENT may be appropriate. Additionally, he reported excessive saliva and req'd cues to clear his throat and reswallow due to hydrophonic ("wet") voice occasionally.  SLP suggested pt chew gum to encourage swallowing of excess saliva. A modified barium swallow exam is recommended to rule out oropharyngeal swallow difficulty.   Speech Therapy Frequency 2x / week   Duration --  8 weeks   Treatment/Interventions Aspiration precaution training;Pharyngeal strengthening exercises;Oral motor exercises;SLP instruction and feedback;Internal/external aids;Compensatory strategies;Patient/family education;Functional tasks;Cueing hierarchy  swallowing interventions will only be used PRN   Potential to Achieve Goals Good          G-Codes - 02/07/15 12-Aug-1206    Functional Assessment Tool Used noms (25-30% impaired)   Functional Limitations Motor speech   Motor Speech Current Status [phone]) At least 20 percent but less than 40 percent impaired, limited or restricted   Motor Speech Goal Status (D3570) At least 1 percent but less than 20 percent impaired, limited or restricted      Problem List Patient Active Problem List   Diagnosis Date Noted  . HLD (hyperlipidemia)   . TIA (transient ischemic attack) 08/09/2014  . Chronic kidney disease 08/09/2014  . Atrial fibrillation 08/09/2014  . Right renal mass   . Atrial fibrillation with rapid ventricular response 07/17/2014  . Elevated troponin 07/17/2014  . Diverticulosis of colon without hemorrhage 06/16/2013  . Post-operative hypothyroidism 06/16/2013  . Renal oncocytoma 06/09/2012  . LBP (low back pain) 06/09/2012  . Parkinson  disease, symptomatic 06/06/2011  . CARCINOMA, SKIN, SQUAMOUS CELL 12/10/2007  . MALIGNANT NEOPLASM OF THYROID GLAND 12/10/2007  . SUBDURAL HEMATOMA 12/10/2007  . BPH (benign prostatic hypertrophy) 12/10/2007  . Essential hypertension 04/22/2007  . Transient cerebral ischemia 04/22/2007  . Generalized atherosclerosis 04/22/2007  . Osteoarthritis 04/22/2007    [facility] , [place], [place]  Feb 07, 2015, 12:09 PM  [facility] [address] [place], [name], [address] Phone: [phone]   Fax:  [phone]

## 2015-01-11 ENCOUNTER — Other Ambulatory Visit (HOSPITAL_COMMUNITY): Payer: Self-pay | Admitting: Neurology

## 2015-01-11 ENCOUNTER — Telehealth: Payer: Self-pay

## 2015-01-11 DIAGNOSIS — G20 Parkinson's disease: Secondary | ICD-10-CM

## 2015-01-11 DIAGNOSIS — G459 Transient cerebral ischemic attack, unspecified: Secondary | ICD-10-CM

## 2015-01-11 DIAGNOSIS — R1314 Dysphagia, pharyngoesophageal phase: Secondary | ICD-10-CM

## 2015-01-11 NOTE — Telephone Encounter (Signed)
Neuro Rehab suggested that patient have a modified barium swallow test done. I called and scheduled this at [facility] 10/10 at 11am. I spoke to patient and he is aware of this appt and voiced understanding.

## 2015-01-11 NOTE — Therapy (Signed)
[facility] [address] [place], [name], [address] Phone: [phone]   Fax:  [phone]  Physical Therapy Evaluation  Patient Details  Name: William [name] MRN: [id number] Date of Birth: 1928-11-20 Referring Provider:  Star Age, MD  Encounter Date: 01/10/2015      PT End of Session - 01/11/15 0750    Visit Number 1   Number of Visits 13   Date for PT Re-Evaluation 03/11/15   Authorization Type [employer] H[employer]-G-code every 10th visit   PT Start Time 0848   PT Stop Time 0930   PT Time Calculation (min) 42 min   Activity Tolerance Patient tolerated treatment well   Behavior During Therapy [facility] for tasks assessed/performed      Past Medical History  Diagnosis Date  . Hyperlipidemia   . Hx of colonic polyps   . Benign prostatic hypertrophy with urinary obstruction   . TIA (transient ischemic attack)     pt does not recall this hx "but may have had one today" (08/09/2014)  . Subdural hematoma 2006  . Gout   . Hypertension     sees [name]  . Thyroid disease     medullary thyroid   . Pneumonia     hx of in 1952  . Parkinson disease   . A-fib   . Complication of anesthesia     "difficulty intubation, had to use fiberoptic 2006"  . Difficult intubation   . Malignant neoplasm of thyroid gland     medullary carcinoma  . Primary skin squamous cell carcinoma   . Basal cell carcinoma   . Hypothyroidism   . DJD (degenerative joint disease)   . Arthritis     "right knee" (08/09/2014)  . Gout     "periodically" (08/09/2014)  . Kidney tumor     "tumor on cyst on kidney"   . DJD (degenerative joint disease)   . Parkinson disease     Past Surgical History  Procedure Laterality Date  . Cataract extraction w/ intraocular lens  implant, bilateral Bilateral ~ 1998  . Cholecystectomy    . Thyroidectomy      medullary carcinoma  . Eye surgery    . Knee arthroscopy Right   . Appendectomy    .  Tonsillectomy    . Lumbar laminectomy/decompression microdiscectomy  05/14/2012    Procedure: LUMBAR LAMINECTOMY/DECOMPRESSION MICRODISCECTOMY 1 LEVEL;  Surgeon: [name], MD;  Location: [facility];  Service: Neurosurgery;  Laterality: Left;  Left Lumbar four-five Laminectomy/Diskectomy/Far lateral diskectomy  . Cryoablation  07-10-12    "tumor on cyst on my kidney; had an ablation"  . Skin cancer excision  "several"    "back of my neck; right eye; clavicle; nose"  . Inguinal hernia repair Right   . Back surgery    . Subdural hematoma evacuation via craniotomy  2006  . Kidney surgery      There were no vitals filed for this visit.  Visit Diagnosis:  Abnormality of gait  Bradykinesia      Subjective Assessment - 01/10/15 0854    Subjective Pt is an 81 year old male who has a history of [name], with bout of A-fib and TIA in the past several months.  He came to a PT-PD screen on 12/22/14, which indicated decreased functional mobility, and he presents today for PT eval.  He has had Parkinson's at least 5 years.  He notes tremor in RUE.  He has had one fall  in the past 6 months, which was falling backwards out of a chair.  He notes staggering at times; he does not use assistive device.  He reports difficulty getting up from low surfaces.   Patient Stated Goals Pt's goal for therapy is to work on balance and stamina.   Currently in Pain? No/denies            [facility] PT Assessment - 01/10/15 0858    Assessment   Medical Diagnosis [name]   Onset Date/Surgical Date --  at least 5 years   Precautions   Precautions Fall   Balance Screen   Has the patient fallen in the past 6 months Yes   How many times? 1   Has the patient had a decrease in activity level because of a fear of falling?  No   Is the patient reluctant to leave their home because of a fear of falling?  No   Home Environment   Living Environment Private residence   Living Arrangements  Spouse/significant other   Available Help at Discharge Family   Type of [place] to enter   Entrance Stairs-Number of Steps 2   Entrance Stairs-Rails None   Home Layout One level   [place] seat;Grab bars - tub/shower   Prior Function   Level of Independence Independent with gait;Independent with transfers  trouble with buttoning   Leisure Enjoy yardwork; plays golf 3x/wk; goes to gym (not regularly)-rides bike at gym   Observation/Other Assessments   Focus on Therapeutic Outcomes (FOTO)  Functional Status intake measure:  73; Neuro QOL lower extremity:  40.3   Tone   Assessment Location Right Lower Extremity   ROM / Strength   AROM / PROM / Strength Strength   Strength   Overall Strength Comments grossly tested at least 4/5, with exception of L ankle dorsiflexion 3/5   Transfers   Transfers Sit to Stand;Stand to Sit   Sit to Stand 6: Modified independent (Device/Increase time);With upper extremity assist;Without upper extremity assist;With armrests;From chair/3-in-1  prefers UE support   Five time sit to stand comments  17.57   Stand to Sit 6: Modified independent (Device/Increase time);With upper extremity assist;Without upper extremity assist;With armrests;To chair/3-in-1   Comments Without UE support during 5x sit<>stand, pt has initial posterior lean, difficulty with initiation of sit>stand   Ambulation/Gait   Ambulation/Gait Yes   Ambulation/Gait Assistance 5: Supervision   Ambulation Distance (Feet) 150 Feet   Assistive device None   Gait Pattern Step-through pattern;Decreased arm swing - right;Decreased step length - right;Decreased dorsiflexion - right;Decreased dorsiflexion - left;Decreased trunk rotation;Narrow base of support;Poor foot clearance - right   Ambulation Surface Level;Indoor   Gait velocity 10.91sec = 3.01 ft/sec   Gait Comments 1140 ft in 6 minute walk test  decr. R foot clearance as walking time progresses    Standardized Balance Assessment   Standardized Balance Assessment Timed Up and Go Test   Timed Up and Go Test   Normal TUG (seconds) 13.42   Manual TUG (seconds) 13.84   Cognitive TUG (seconds) 15.37   Functional Gait  Assessment   Gait assessed  Yes   Gait Level Surface Walks 20 ft in less than 7 sec but greater than 5.5 sec, uses assistive device, slower speed, mild gait deviations, or deviates 6-10 in outside of the 12 in walkway width.  6.28   Change in Gait Speed Able to smoothly change walking speed without loss of balance  or gait deviation. Deviate no more than 6 in outside of the 12 in walkway width.   Gait with Horizontal Head Turns Performs head turns smoothly with slight change in gait velocity (eg, minor disruption to smooth gait path), deviates 6-10 in outside 12 in walkway width, or uses an assistive device.   Gait with Vertical Head Turns Performs task with slight change in gait velocity (eg, minor disruption to smooth gait path), deviates 6 - 10 in outside 12 in walkway width or uses assistive device   Gait and Pivot Turn Pivot turns safely in greater than 3 sec and stops with no loss of balance, or pivot turns safely within 3 sec and stops with mild imbalance, requires small steps to catch balance.   Step Over Obstacle Is able to step over one shoe box (4.5 in total height) but must slow down and adjust steps to clear box safely. May require verbal cueing.   Gait with Narrow Base of Support Ambulates less than 4 steps heel to toe or cannot perform without assistance.   Gait with Eyes Closed Walks 20 ft, slow speed, abnormal gait pattern, evidence for imbalance, deviates 10-15 in outside 12 in walkway width. Requires more than 9 sec to ambulate 20 ft.  14.25 sec   Ambulating Backwards Walks 20 ft, uses assistive device, slower speed, mild gait deviations, deviates 6-10 in outside 12 in walkway width.  17.91 sec in 20 ft= 1.11 ft/sec   Steps Two feet to a stair, must use rail.    Total Score 16   RUE Tone   RUE Tone Mild                             PT Short Term Goals - 01/11/15 0801    PT SHORT TERM GOAL #1   Title Pt will perform HEP with supervision for improved functional mobility, transfers, balance and gait.  TARGET 02/09/15   Time 4   Period Weeks   Status New   PT SHORT TERM GOAL #2   Title Pt will improve 5x sit<>stand to less than or equal to 15 seconds for improved efficiency and safety with transfers.   Time 4   Period Weeks   Status New   PT SHORT TERM GOAL #3   Title Pt will improve TUG cognitive to less than or equal to 15 seconds for decreased fall risk/improved dual tasking with gait.   Time 4   Period Weeks   Status New   PT SHORT TERM GOAL #4   Title Pt will verbalize understanding of local Parkinson's resources.   Time 4   Period Weeks   Status New           PT Long Term Goals - 01/11/15 0808    PT LONG TERM GOAL #1   Title Pt will verbalize understanding of fall prevention within the home environment.  TARGET 03/11/15   Time 6   Period Weeks   Status New   PT LONG TERM GOAL #2   Title Pt will perform at least 8 of 10 reps of sit<>stand transfers from 18 inch surfaces or lower, with minimal to no UE support, for improved safety and efficiency with transfers.   Time 6   Period Weeks   Status New   PT LONG TERM GOAL #3   Title Pt will improve Functional Gait Assessment to at least 19/30 for decreased fall risk.   Time 6  Period Weeks   Status New   PT LONG TERM GOAL #4   Title Pt will verbalize plans for continued community fitness upon D/C from PT.   Time 6   Period Weeks   Status New               Plan - 01/11/15 0751    Clinical Impression Statement Pt is an 81 year old with at least 5 year history of [name], with recent bout of A-fib/TIA.  Pt came for PT-PD screen on 12/22/14, which indicated decreased functional mobility; therefore, pt was referred for PT evaluation.  Pt  is at fall risk per TUG cognitive (indicating decreased dual tasking ability with gait) as well as Functional Gait ASsessment; pt presents with decreased functional strength with 5x sit<>stand transfer test.  Pt also presents with bradykinesia, rigidity, decreased balance, decreased timing and coordination of gait.  Pt would benefit from skilled PT to address the previous stated deficits.   Pt will benefit from skilled therapeutic intervention in order to improve on the following deficits Abnormal gait;Decreased balance;Decreased mobility;Decreased strength;Difficulty walking;Postural dysfunction   Rehab Potential Good   PT Frequency 2x / week   PT Duration 6 weeks  plus eval   PT Treatment/Interventions ADLs/Self Care Home Management;Therapeutic activities;Therapeutic exercise;Functional mobility training;Gait training;Balance training;Neuromuscular re-education;Patient/family education   PT Next Visit Plan Work on transfers, initiate HEP for improved large amplitude movement/posture-PWR! Moves sitting and standing   Consulted and Agree with Plan of Care Patient          G-Codes - 2015-01-27 5277    Functional Assessment Tool Used Functional Gait Assessment 16/30; TUG cognitive 15.37 sec, 5x sit<>stand 17.57 seconds   Functional Limitation Mobility: Walking and moving around   Mobility: Walking and Moving Around Current Status [phone]) At least 20 percent but less than 40 percent impaired, limited or restricted   Mobility: Walking and Moving Around Goal Status [phone]) At least 1 percent but less than 20 percent impaired, limited or restricted       Problem List Patient Active Problem List   Diagnosis Date Noted  . HLD (hyperlipidemia)   . TIA (transient ischemic attack) 08/09/2014  . Chronic kidney disease 08/09/2014  . Atrial fibrillation 08/09/2014  . Right renal mass   . Atrial fibrillation with rapid ventricular response 07/17/2014  . Elevated troponin 07/17/2014  . Diverticulosis  of colon without hemorrhage 06/16/2013  . Post-operative hypothyroidism 06/16/2013  . Renal oncocytoma 06/09/2012  . LBP (low back pain) 06/09/2012  . Parkinson disease, symptomatic 06/06/2011  . CARCINOMA, SKIN, SQUAMOUS CELL 12/10/2007  . MALIGNANT NEOPLASM OF THYROID GLAND 12/10/2007  . SUBDURAL HEMATOMA 12/10/2007  . BPH (benign prostatic hypertrophy) 12/10/2007  . Essential hypertension 04/22/2007  . Transient cerebral ischemia 04/22/2007  . Generalized atherosclerosis 04/22/2007  . Osteoarthritis 04/22/2007    MARRIOTT,AMY W. 01/11/2015, 8:12 AM Frazier Butt., PT [facility] [address] [place], [name], [address] Phone: [phone]   Fax:  [phone]

## 2015-01-23 ENCOUNTER — Ambulatory Visit (HOSPITAL_COMMUNITY): Payer: Commercial Managed Care - HMO

## 2015-01-24 ENCOUNTER — Ambulatory Visit: Payer: Commercial Managed Care - HMO | Attending: Pulmonary Disease | Admitting: Occupational Therapy

## 2015-01-24 ENCOUNTER — Ambulatory Visit (INDEPENDENT_AMBULATORY_CARE_PROVIDER_SITE_OTHER): Payer: Commercial Managed Care - HMO | Admitting: Primary Care

## 2015-01-24 ENCOUNTER — Ambulatory Visit: Payer: Medicare HMO | Admitting: Primary Care

## 2015-01-24 ENCOUNTER — Ambulatory Visit (HOSPITAL_COMMUNITY)
Admission: RE | Admit: 2015-01-24 | Discharge: 2015-01-24 | Disposition: A | Discharge status: Home / Self Care | Payer: Commercial Managed Care - HMO | Source: Ambulatory Visit | Attending: Neurology | Admitting: Neurology

## 2015-01-24 ENCOUNTER — Ambulatory Visit: Payer: Commercial Managed Care - HMO | Admitting: Physical Therapy

## 2015-01-24 VITALS — BP 132/68 | HR 58 | Temp 97.7°F | Ht 76.0 in | Wt 180.8 lb

## 2015-01-24 DIAGNOSIS — G20 Parkinson's disease: Secondary | ICD-10-CM | POA: Diagnosis not present

## 2015-01-24 DIAGNOSIS — Z23 Encounter for immunization: Secondary | ICD-10-CM | POA: Diagnosis not present

## 2015-01-24 DIAGNOSIS — R29898 Other symptoms and signs involving the musculoskeletal system: Secondary | ICD-10-CM

## 2015-01-24 DIAGNOSIS — R2681 Unsteadiness on feet: Secondary | ICD-10-CM | POA: Insufficient documentation

## 2015-01-24 DIAGNOSIS — R258 Other abnormal involuntary movements: Secondary | ICD-10-CM

## 2015-01-24 DIAGNOSIS — R471 Dysarthria and anarthria: Secondary | ICD-10-CM | POA: Insufficient documentation

## 2015-01-24 DIAGNOSIS — R1310 Dysphagia, unspecified: Secondary | ICD-10-CM | POA: Insufficient documentation

## 2015-01-24 DIAGNOSIS — I10 Essential (primary) hypertension: Secondary | ICD-10-CM

## 2015-01-24 DIAGNOSIS — R269 Unspecified abnormalities of gait and mobility: Secondary | ICD-10-CM | POA: Diagnosis not present

## 2015-01-24 DIAGNOSIS — R1314 Dysphagia, pharyngoesophageal phase: Secondary | ICD-10-CM

## 2015-01-24 DIAGNOSIS — R279 Unspecified lack of coordination: Secondary | ICD-10-CM

## 2015-01-24 NOTE — Therapy (Signed)
[facility] [address] [place], [name], [address] Phone: [phone]   Fax:  [phone]  Occupational Therapy Treatment  Patient Details  Name: William [name] MRN: [id number] Date of Birth: 09/19/1928 Referring Provider:  Jinny Sanders, MD  Encounter Date: 01/24/2015      OT End of Session - 01/24/15 1306    Visit Number 2   Number of Visits 17   Date for OT Re-Evaluation 03/11/15   Authorization Type [employer] H[employer], no visit limit, no auth required   Authorization - Visit Number 2   Authorization - Number of Visits 10   OT Start Time [phone]   OT Stop Time 0845   OT Time Calculation (min) 35 min   Activity Tolerance Patient tolerated treatment well   Behavior During Therapy [facility] for tasks assessed/performed      Past Medical History  Diagnosis Date  . Hyperlipidemia   . Hx of colonic polyps   . Benign prostatic hypertrophy with urinary obstruction   . TIA (transient ischemic attack)     pt does not recall this hx "but may have had one today" (08/09/2014)  . Subdural hematoma 2006  . Gout   . Hypertension     sees [name]  . Thyroid disease     medullary thyroid   . Pneumonia     hx of in 1952  . Parkinson disease   . A-fib   . Complication of anesthesia     "difficulty intubation, had to use fiberoptic 2006"  . Difficult intubation   . Malignant neoplasm of thyroid gland     medullary carcinoma  . Primary skin squamous cell carcinoma   . Basal cell carcinoma   . Hypothyroidism   . DJD (degenerative joint disease)   . Arthritis     "right knee" (08/09/2014)  . Gout     "periodically" (08/09/2014)  . Kidney tumor     "tumor on cyst on kidney"   . DJD (degenerative joint disease)   . Parkinson disease     Past Surgical History  Procedure Laterality Date  . Cataract extraction w/ intraocular lens  implant, bilateral Bilateral ~ 1998  . Cholecystectomy    . Thyroidectomy      medullary  carcinoma  . Eye surgery    . Knee arthroscopy Right   . Appendectomy    . Tonsillectomy    . Lumbar laminectomy/decompression microdiscectomy  05/14/2012    Procedure: LUMBAR LAMINECTOMY/DECOMPRESSION MICRODISCECTOMY 1 LEVEL;  Surgeon: [name], MD;  Location: [facility];  Service: Neurosurgery;  Laterality: Left;  Left Lumbar four-five Laminectomy/Diskectomy/Far lateral diskectomy  . Cryoablation  07-10-12    "tumor on cyst on my kidney; had an ablation"  . Skin cancer excision  "several"    "back of my neck; right eye; clavicle; nose"  . Inguinal hernia repair Right   . Back surgery    . Subdural hematoma evacuation via craniotomy  2006  . Kidney surgery      There were no vitals filed for this visit.  Visit Diagnosis:  Bradykinesia  Lack of coordination  Rigidity      Subjective Assessment - 01/24/15 0927    Subjective  Pt reports he has been using stationary bike at the gym   Pertinent History PD (dx 2009), 07/2014 a-fib, TIA, hx of R craniotomy (11/2004), TIA (2003), thyroid CA, partial onset seizures   Patient Stated Goals improve ADLs and R hand use  Currently in Pain? No/denies        Treatment: PWR! Basic 4 in seated, 10-20 reps each, demonstration and min v.c. Required for positioning and large amplitude movements. Arm bike x 6 mins level 1 for conditioning. Pt was able to maintain 40 RPM. Seated fine motor coordination activities: Flipping playing cards then dealing with thumb, mod v.c./ demonstration for positioning. Therapist discussed the benefits of exercise with pt. He expressed interest in boxing. Therapist told pt about cycling class and PWR! [facility] exercise class.                        OT Short Term Goals - 01/10/15 1703    OT SHORT TERM GOAL #1   Title Pt will be independent with PD-specific HEP.--check STGs 02/08/15   Time 4   Period Weeks   Status New   OT SHORT TERM GOAL #2   Title Pt will be able to write at least 3  sentences with 100% legibility and no significant decrease in size.   Baseline 90%, min decr in size   Time 4   Period Weeks   Status New   OT SHORT TERM GOAL #3   Title Pt will improve coordination for ADLs as shown by improving time on 9-hole peg test by at least 8sec with RUE.   Baseline 60.44   Time 4   Period Weeks   Status New   OT SHORT TERM GOAL #4   Title Pt will improve ability to eat as shown by completing PPT#2 in 12sec or less   Baseline 15.01   Time 4   Period Weeks   Status New   OT SHORT TERM GOAL #5   Title Pt will improve ability to button as shown by ability to button/unbutton 3 buttons with shirt on tabletop in 70sec or less.   Baseline 83.03   Time 4   Period Weeks   Status New   Additional Short Term Goals   Additional Short Term Goals Yes   OT SHORT TERM GOAL #6   Title Pt will verbalize understanding of ways to prevent future complications and appropriate PD-related community resources.   Time 4   Period Weeks   Status New           OT Long Term Goals - 01/10/15 1707    OT LONG TERM GOAL #1   Title Pt will verbalize understanding of adaptive strategies/AE to incr ease/efficiency with ADLs/IADLs prn.   Time 8   Period Weeks   Status New   OT LONG TERM GOAL #2   Title Pt will be able to carry glass of liquid at least 20feet without spills x2.   Time 8   Period Weeks   Status New   OT LONG TERM GOAL #3   Title Pt will improve coordination for ADLs as shown by improving time on 9-hole peg test by at least 15sec with RUE.   Baseline 60.44   Time 8   Period Weeks   Status New   OT LONG TERM GOAL #4   Title Pt will improve coordination/functional reaching for ADLs as shown by improving score on box and blocks test by at least 6 bilaterally.   Baseline R-31, L-37 blocks   Time 8   Period Weeks   Status New   OT LONG TERM GOAL #5   Title Pt will improve balance/functional reaching for ADLs as shown by improving standing functional reach test  to  at least 10" with LUE.   Time 8   Period Weeks   Status New   Long Term Additional Goals   Additional Long Term Goals Yes   OT LONG TERM GOAL #6   Title Pt will improve ability to button as shown by ability to button/unbutton 3 buttons with shirt on tabletop in 60sec or less.   Baseline 55sec   Time 8   Period Weeks   Status New               Plan - 01/24/15 9038    Clinical Impression Statement Pt is progressing towards goals. He demonstrates understanding of initial seated PWR! exercises.   Pt will benefit from skilled therapeutic intervention in order to improve on the following deficits (Retired) Decreased mobility;Impaired UE functional use;Decreased knowledge of use of DME;Decreased balance;Decreased activity tolerance;Impaired tone;Decreased coordination   Rehab Potential Good   OT Frequency 2x / week   OT Duration 8 weeks   OT Treatment/Interventions Self-care/ADL training;Cryotherapy;Moist Heat;Passive range of motion;Fluidtherapy;Therapeutic activities;DME and/or AE instruction;Therapeutic exercises;Splinting;Manual Therapy;Neuromuscular education;Ultrasound;Therapeutic exercise;Functional Mobility Training;Patient/family education   Plan issue coordination HEP, progress PWR! exercises   OT Home Exercise Plan issued: seated PWR!        Problem List Patient Active Problem List   Diagnosis Date Noted  . HLD (hyperlipidemia)   . TIA (transient ischemic attack) 08/09/2014  . Chronic kidney disease 08/09/2014  . Atrial fibrillation ([place]) 08/09/2014  . Right renal mass   . Atrial fibrillation with rapid ventricular response ([place]) 07/17/2014  . Elevated troponin 07/17/2014  . Diverticulosis of colon without hemorrhage 06/16/2013  . Post-operative hypothyroidism 06/16/2013  . Renal oncocytoma 06/09/2012  . LBP (low back pain) 06/09/2012  . Parkinson disease, symptomatic ([place]) 06/06/2011  . CARCINOMA, SKIN, SQUAMOUS CELL 12/10/2007  . MALIGNANT NEOPLASM OF  THYROID GLAND 12/10/2007  . SUBDURAL HEMATOMA 12/10/2007  . BPH (benign prostatic hypertrophy) 12/10/2007  . Essential hypertension 04/22/2007  . Transient cerebral ischemia 04/22/2007  . Generalized atherosclerosis 04/22/2007  . Osteoarthritis 04/22/2007    Joffre Lucks 01/24/2015, 1:12 PM Theone Murdoch, OTR/L [phone] Phone: [phone] 1:12 PM 01/24/2015 [facility] [address] [place], [name], [address] Phone: [phone]   Fax:  [phone]

## 2015-01-24 NOTE — Progress Notes (Signed)
Subjective:    Patient ID: William [name], male    DOB: June 19, 1928, 81 y.o.   MRN: [id number]  HPI  [name] is an 81 year old male who presents today for evaluation of hypertension. Last visit he was confused on his medications. He was taking losartan 50 mg when he was prescribed losartan-HCTZ 100/12.5 mg. His blood pressure last visit was elevated.  Since his last visit he's been checking his blood pressure and his readings have been 130's/70's on average. Denies chest pain, shortness of breath, dizziness, headaches.   He's currently undergoing Occupational and Physical therapy for parkinson's disease. He underwent therapy and barium swallow testing this morning. Therapy will continue for 4 additional weeks with re-evaluation towards the end.  Review of Systems  Respiratory: Negative for shortness of breath.   Cardiovascular: Negative for chest pain.  Neurological: Negative for dizziness and headaches.       Past Medical History  Diagnosis Date  . Hyperlipidemia   . Hx of colonic polyps   . Benign prostatic hypertrophy with urinary obstruction   . TIA (transient ischemic attack)     pt does not recall this hx "but may have had one today" (08/09/2014)  . Subdural hematoma 2006  . Gout   . Hypertension     sees [name]  . Thyroid disease     medullary thyroid   . Pneumonia     hx of in 1952  . Parkinson disease   . A-fib   . Complication of anesthesia     "difficulty intubation, had to use fiberoptic 2006"  . Difficult intubation   . Malignant neoplasm of thyroid gland     medullary carcinoma  . Primary skin squamous cell carcinoma   . Basal cell carcinoma   . Hypothyroidism   . DJD (degenerative joint disease)   . Arthritis     "right knee" (08/09/2014)  . Gout     "periodically" (08/09/2014)  . Kidney tumor     "tumor on cyst on kidney"   . DJD (degenerative joint disease)   . Parkinson disease     Social History   Social History  . Marital  Status: Widowed    Spouse Name: N/A  . Number of Children: 3  . Years of Education: 15   Occupational History  . retired    Social History Main Topics  . Smoking status: Former Smoker -- 0.80 packs/day for 15 years    Types: Cigarettes    Quit date: 04/15/1965  . Smokeless tobacco: Never Used  . Alcohol Use: 6.0 oz/week    0 Standard drinks or equivalent, 10 Glasses of wine per week     Comment: 1 glass of wine every night (6oz glass)  . Drug Use: No  . Sexual Activity: Yes   Other Topics Concern  . Not on file   Social History Narrative   Married.   Lives at home with significant other.   Retried. 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   Enjoys golfing, yard work, spending time with family.         Past Surgical History  Procedure Laterality Date  . Cataract extraction w/ intraocular lens  implant, bilateral Bilateral ~ 1998  . Cholecystectomy    . Thyroidectomy      medullary carcinoma  . Eye surgery    . Knee arthroscopy Right   . Appendectomy    . Tonsillectomy    . Lumbar laminectomy/decompression microdiscectomy  05/14/2012  Procedure: LUMBAR LAMINECTOMY/DECOMPRESSION MICRODISCECTOMY 1 LEVEL;  Surgeon: [name], MD;  Location: [facility];  Service: Neurosurgery;  Laterality: Left;  Left Lumbar four-five Laminectomy/Diskectomy/Far lateral diskectomy  . Cryoablation  07-10-12    "tumor on cyst on my kidney; had an ablation"  . Skin cancer excision  "several"    "back of my neck; right eye; clavicle; nose"  . Inguinal hernia repair Right   . Back surgery    . Subdural hematoma evacuation via craniotomy  2006  . Kidney surgery      Family History  Problem Relation Age of Onset  . Stroke Mother   . Alzheimer's disease Brother   . Stroke Father   . Alzheimer's disease Sister     No Known Allergies  Current Outpatient Prescriptions on File Prior to Visit  Medication Sig Dispense Refill  . apixaban (ELIQUIS) 2.5 MG TABS tablet Take 1 tablet (2.5 mg  total) by mouth 2 (two) times daily. 180 tablet 3  . carbidopa-levodopa (SINEMET IR) 25-100 MG per tablet Take 1 tablet by mouth 4 (four) times daily. At 8, 12, 5 PM, and 9 or 10 PM 360 tablet 3  . hydrALAZINE (APRESOLINE) 25 MG tablet Take 1 tablet (25 mg total) by mouth 3 (three) times daily. 90 tablet 3  . levothyroxine (SYNTHROID) 100 MCG tablet Take 1 tablet (100 mcg total) by mouth daily before breakfast. 90 tablet 2  . losartan-hydrochlorothiazide (HYZAAR) 100-12.5 MG per tablet Take 1 tablet by mouth daily. 90 tablet 3  . Multiple Vitamin (MULTIVITAMIN WITH MINERALS) TABS Take 1 tablet by mouth daily.    . Probiotic Product (ALIGN) 4 MG CAPS Take 1 capsule by mouth daily.    . rasagiline (AZILECT) 1 MG TABS tablet Take 1 tablet (1 mg total) by mouth daily. 90 tablet 3  . simvastatin (ZOCOR) 20 MG tablet Take 1 tablet (20 mg total) by mouth at bedtime. 90 tablet 3  . [DISCONTINUED] losartan (COZAAR) 50 MG tablet Take 1 tablet (50 mg total) by mouth daily. 30 tablet 1   No current facility-administered medications on file prior to visit.    BP 132/68 mmHg  Pulse 58  Temp(Src) 97.7 F (36.5 C) (Oral)  Ht 6\' 4"  (1.93 m)  Wt 180 lb 12.8 oz (82.01 kg)  BMI 22.02 kg/m2  SpO2 98%    Objective:   Physical Exam  Constitutional: He appears well-nourished.  Cardiovascular: Normal rate and regular rhythm.   Pulmonary/Chest: Effort normal and breath sounds normal.  Musculoskeletal:  Resting tremor to right hand.  Skin: Skin is warm and dry.          Assessment & Plan:

## 2015-01-24 NOTE — Progress Notes (Signed)
Pre visit review using our clinic review tool, if applicable. No additional management support is needed unless otherwise documented below in the visit note. 

## 2015-01-24 NOTE — Patient Instructions (Addendum)
Please schedule a Medicare Wellness Visit before the end of the year. We will complete labs that day.  Continue to check your blood pressure once every 1-2 weeks. Please notify me if you get readings consistently above 150/90 or below 100/60.  It was a pleasure to see you today!

## 2015-01-24 NOTE — Assessment & Plan Note (Signed)
Stable today in the clinic. Managed on losartan-HCTZ. Continue current regimen. BMP next visit.

## 2015-01-25 ENCOUNTER — Encounter: Payer: Commercial Managed Care - HMO | Admitting: Occupational Therapy

## 2015-01-25 ENCOUNTER — Ambulatory Visit: Payer: Commercial Managed Care - HMO | Admitting: Physical Therapy

## 2015-01-25 NOTE — Therapy (Signed)
[facility] [address] [place], [name], [address] Phone: [phone]   Fax:  [phone]  Physical Therapy Treatment  Patient Details  Name: William [name] MRN: [id number] Date of Birth: 01/21/1929 Referring Provider:  Jinny Sanders, MD  Encounter Date: 01/24/2015      PT End of Session - 01/25/15 0837    Visit Number 2   Number of Visits 13   Date for PT Re-Evaluation 03/11/15   Authorization Type [employer] HMO-G-code every 10th visit   PT Start Time 0847   PT Stop Time 0930   PT Time Calculation (min) 43 min   Activity Tolerance Patient tolerated treatment well   Behavior During Therapy [facility] for tasks assessed/performed      Past Medical History  Diagnosis Date  . Hyperlipidemia   . Hx of colonic polyps   . Benign prostatic hypertrophy with urinary obstruction   . TIA (transient ischemic attack)     pt does not recall this hx "but may have had one today" (08/09/2014)  . Subdural hematoma 2006  . Gout   . Hypertension     sees [name]  . Thyroid disease     medullary thyroid   . Pneumonia     hx of in 1952  . Parkinson disease   . A-fib   . Complication of anesthesia     "difficulty intubation, had to use fiberoptic 2006"  . Difficult intubation   . Malignant neoplasm of thyroid gland     medullary carcinoma  . Primary skin squamous cell carcinoma   . Basal cell carcinoma   . Hypothyroidism   . DJD (degenerative joint disease)   . Arthritis     "right knee" (08/09/2014)  . Gout     "periodically" (08/09/2014)  . Kidney tumor     "tumor on cyst on kidney"   . DJD (degenerative joint disease)   . Parkinson disease     Past Surgical History  Procedure Laterality Date  . Cataract extraction w/ intraocular lens  implant, bilateral Bilateral ~ 1998  . Cholecystectomy    . Thyroidectomy      medullary carcinoma  . Eye surgery    . Knee arthroscopy Right   . Appendectomy    .  Tonsillectomy    . Lumbar laminectomy/decompression microdiscectomy  05/14/2012    Procedure: LUMBAR LAMINECTOMY/DECOMPRESSION MICRODISCECTOMY 1 LEVEL;  Surgeon: [name], MD;  Location: [facility];  Service: Neurosurgery;  Laterality: Left;  Left Lumbar four-five Laminectomy/Diskectomy/Far lateral diskectomy  . Cryoablation  07-10-12    "tumor on cyst on my kidney; had an ablation"  . Skin cancer excision  "several"    "back of my neck; right eye; clavicle; nose"  . Inguinal hernia repair Right   . Back surgery    . Subdural hematoma evacuation via craniotomy  2006  . Kidney surgery      There were no vitals filed for this visit.  Visit Diagnosis:  Bradykinesia  Lack of coordination      Subjective Assessment - 01/24/15 0853    Subjective Denies falls or change since last visit.  Plays golf 2-3 days a week and yard work.   Patient Stated Goals Pt's goal for therapy is to work on balance and stamina.   Currently in Pain? No/denies                         [facility] Adult PT  Treatment/Exercise - 01/25/15 0001    Transfers   Transfers Sit to Stand;Stand to Sit   Sit to Stand 5: Supervision   Sit to Stand Details Tactile cues for sequencing;Tactile cues for weight shifting;Verbal cues for sequencing;Verbal cues for technique;Other (comment)   Sit to Stand Details (indicate cue type and reason) cues for forward weight shift, using momentum, foot placement, maintaining forward weight shift.  Practiced from various surfaces (20" mat, 18" chair, 18" simulated couch and 16" simulated couch)   Knee/Hip Exercises: Aerobic   Stationary Bike Scifit level 2.0 all 4 extremities x 8 minutes focusing on rpm and bouts of increased intensity.  Able to keep intensity >90 most of time and bouts of 105 rpm           [facility]) - 01/25/15 0831    PWR! exercises Moves in standing   PWR! Up 20   PWR! Rock 20   PWR! Twist 20   PWR Step 20   Comments provided as HEP              PT Education - 01/25/15 0836    Education provided Yes   Education Details PWR! standing, intensity with recumbent bike at gym   Person(s) Educated Patient   Methods Explanation;Demonstration;Handout;Verbal cues   Comprehension Verbalized understanding;Returned demonstration;Need further instruction          PT Short Term Goals - 01/11/15 0801    PT SHORT TERM GOAL #1   Title Pt will perform HEP with supervision for improved functional mobility, transfers, balance and gait.  TARGET 02/09/15   Time 4   Period Weeks   Status New   PT SHORT TERM GOAL #2   Title Pt will improve 5x sit<>stand to less than or equal to 15 seconds for improved efficiency and safety with transfers.   Time 4   Period Weeks   Status New   PT SHORT TERM GOAL #3   Title Pt will improve TUG cognitive to less than or equal to 15 seconds for decreased fall risk/improved dual tasking with gait.   Time 4   Period Weeks   Status New   PT SHORT TERM GOAL #4   Title Pt will verbalize understanding of local Parkinson's resources.   Time 4   Period Weeks   Status New           PT Long Term Goals - 01/11/15 0808    PT LONG TERM GOAL #1   Title Pt will verbalize understanding of fall prevention within the home environment.  TARGET 03/11/15   Time 6   Period Weeks   Status New   PT LONG TERM GOAL #2   Title Pt will perform at least 8 of 10 reps of sit<>stand transfers from 18 inch surfaces or lower, with minimal to no UE support, for improved safety and efficiency with transfers.   Time 6   Period Weeks   Status New   PT LONG TERM GOAL #3   Title Pt will improve Functional Gait Assessment to at least 19/30 for decreased fall risk.   Time 6   Period Weeks   Status New   PT LONG TERM GOAL #4   Title Pt will verbalize plans for continued community fitness upon D/C from PT.   Time 6   Period Weeks   Status New               Plan - 01/25/15 0837    Clinical Impression  Statement  Pt participated well during session and responds well to verbal and tactile cues with all activites.  Continue PT per POC>   Pt will benefit from skilled therapeutic intervention in order to improve on the following deficits Abnormal gait;Decreased balance;Decreased mobility;Decreased strength;Difficulty walking;Postural dysfunction   Rehab Potential Good   PT Frequency 2x / week   PT Duration 6 weeks  plus eval   PT Treatment/Interventions ADLs/Self Care Home Management;Therapeutic activities;Therapeutic exercise;Functional mobility training;Gait training;Balance training;Neuromuscular re-education;Patient/family education   PT Next Visit Plan Review transfers and PWR! standing, gait with large amplitude (treadmill?)        Problem List Patient Active Problem List   Diagnosis Date Noted  . HLD (hyperlipidemia)   . TIA (transient ischemic attack) 08/09/2014  . Chronic kidney disease 08/09/2014  . Atrial fibrillation ([place]) 08/09/2014  . Right renal mass   . Atrial fibrillation with rapid ventricular response ([place]) 07/17/2014  . Elevated troponin 07/17/2014  . Diverticulosis of colon without hemorrhage 06/16/2013  . Post-operative hypothyroidism 06/16/2013  . Renal oncocytoma 06/09/2012  . LBP (low back pain) 06/09/2012  . Parkinson disease, symptomatic ([place]) 06/06/2011  . CARCINOMA, SKIN, SQUAMOUS CELL 12/10/2007  . MALIGNANT NEOPLASM OF THYROID GLAND 12/10/2007  . SUBDURAL HEMATOMA 12/10/2007  . BPH (benign prostatic hypertrophy) 12/10/2007  . Essential hypertension 04/22/2007  . Transient cerebral ischemia 04/22/2007  . Generalized atherosclerosis 04/22/2007  . Osteoarthritis 04/22/2007    Narda Bonds 01/25/2015, 8:39 AM  [facility] [address] [place], [name], [address] Phone: [phone]   Fax:  [place] 01/25/2015 8:39 AM Phone: [phone] Fax: [phone]

## 2015-01-25 NOTE — Patient Instructions (Signed)
Provided PWR! Standing handout as HEP

## 2015-01-31 ENCOUNTER — Ambulatory Visit: Payer: Commercial Managed Care - HMO | Admitting: Occupational Therapy

## 2015-01-31 ENCOUNTER — Encounter: Payer: Self-pay | Admitting: Occupational Therapy

## 2015-01-31 ENCOUNTER — Ambulatory Visit: Payer: Commercial Managed Care - HMO | Admitting: Speech Pathology

## 2015-01-31 ENCOUNTER — Ambulatory Visit: Payer: Commercial Managed Care - HMO | Admitting: Physical Therapy

## 2015-01-31 DIAGNOSIS — R1310 Dysphagia, unspecified: Secondary | ICD-10-CM

## 2015-01-31 DIAGNOSIS — R269 Unspecified abnormalities of gait and mobility: Secondary | ICD-10-CM

## 2015-01-31 DIAGNOSIS — R258 Other abnormal involuntary movements: Secondary | ICD-10-CM

## 2015-01-31 DIAGNOSIS — R2681 Unsteadiness on feet: Secondary | ICD-10-CM

## 2015-01-31 DIAGNOSIS — R29898 Other symptoms and signs involving the musculoskeletal system: Secondary | ICD-10-CM

## 2015-01-31 DIAGNOSIS — R471 Dysarthria and anarthria: Secondary | ICD-10-CM | POA: Diagnosis not present

## 2015-01-31 DIAGNOSIS — R279 Unspecified lack of coordination: Secondary | ICD-10-CM | POA: Diagnosis not present

## 2015-01-31 NOTE — Therapy (Signed)
[facility] [address] [place], [name], [address] Phone: [phone]   Fax:  [phone]  Physical Therapy Treatment  Patient Details  Name: William [name] MRN: [id number] Date of Birth: [birth year] No Data Recorded  Encounter Date: 01/31/2015      PT End of Session - 01/31/15 1905    Visit Number 3   Number of Visits 13   Date for PT Re-Evaluation 03/11/15   Authorization Type [employer] H[employer]-G-code every 10th visit   PT Start Time 1149   PT Stop Time 1232   PT Time Calculation (min) 43 min   Equipment Utilized During Treatment Gait belt  on treadmill   Activity Tolerance Patient tolerated treatment well   Behavior During Therapy [facility] for tasks assessed/performed      Past Medical History  Diagnosis Date  . Hyperlipidemia   . Hx of colonic polyps   . Benign prostatic hypertrophy with urinary obstruction   . TIA (transient ischemic attack)     pt does not recall this hx "but may have had one today" (08/09/2014)  . Subdural hematoma ([place]) 2006  . Gout   . Hypertension     sees [name]  . Thyroid disease     medullary thyroid   . Pneumonia     hx of in 1952  . Parkinson disease ([place])   . A-fib ([place])   . Complication of anesthesia     "difficulty intubation, had to use fiberoptic 2006"  . Difficult intubation   . 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 Primary skin squamous cell carcinoma   . Basal cell carcinoma   . Hypothyroidism   . DJD (degenerative joint disease)   . Arthritis     "right knee" (08/09/2014)  . Gout     "periodically" (08/09/2014)  . Kidney tumor     "tumor on cyst on kidney"   . DJD (degenerative joint disease)   . Parkinson disease [facility])     Past Surgical History  Procedure Laterality Date  . Cataract extraction w/ intraocular lens  implant, bilateral Bilateral ~ 1998  . Cholecystectomy    . Thyroidectomy      medullary carcinoma  .  Eye surgery    . Knee arthroscopy Right   . Appendectomy    . Tonsillectomy    . Lumbar laminectomy/decompression microdiscectomy  05/14/2012    Procedure: LUMBAR LAMINECTOMY/DECOMPRESSION MICRODISCECTOMY 1 LEVEL;  Surgeon: [name], MD;  Location: [place] [facility];  Service: Neurosurgery;  Laterality: Left;  Left Lumbar four-five Laminectomy/Diskectomy/Far lateral diskectomy  . Cryoablation  07-10-12    "tumor on cyst on my kidney; had an ablation"  . Skin cancer excision  "several"    "back of my neck; right eye; clavicle; nose"  . Inguinal hernia repair Right   . Back surgery    . Subdural hematoma evacuation via craniotomy  2006  . Kidney surgery      There were no vitals filed for this visit.  Visit Diagnosis:  Abnormality of gait  Bradykinesia      Subjective Assessment - 01/31/15 1858    Subjective Denies falls or changes since last visit.  Has been playing golf.  Feels trunk rotation is improving.   Patient Stated Goals Pt's goal for therapy is to work on balance and stamina.   Currently in Pain? No/denies  [facility] Adult PT Treatment/Exercise - 01/31/15 0001    Ambulation/Gait   Ambulation/Gait Yes   Ambulation/Gait Assistance 5: Supervision   Ambulation/Gait Assistance Details gait with walking poles with PTA assisting with arm swing and with pt using walking poles.  Pt with decreased coordination with walking poles and decreased bil arm swing tends to get pt out of sequence with opposite arm/opposite leg pattern.  Gait on treadmill with cues for heel strike and step length using gait trainer.  Pt actually with decreased step length on L (although states R side more affected by PD) but feel this may be due to R knee feeling "unstable" at times per pt and pt hestant to stand long on R during stance.   Ambulation Distance (Feet) 1000 Feet  plus 6 minutes on treadmill   Assistive device None;Other (Comment)  walking poles and treadmill  with UE support   Gait Pattern Step-through pattern;Decreased arm swing - right;Decreased step length - right;Decreased dorsiflexion - right;Decreased dorsiflexion - left;Decreased trunk rotation;Narrow base of support;Poor foot clearance - right   Ambulation Surface Level;Unlevel;Indoor;Outdoor           PWR [facility]) - 01/31/15 1911    PWR! exercises Moves in standing   PWR! Up 20   PWR! Rock 20   PWR! Twist 20   PWR Step 20   Comments in standing-cues for intensity but less cues than previous session             PT Education - 01/31/15 1904    Education provided Yes   Education Details gait with intensity and arm swing, purpose of walking poles   Person(s) Educated Patient   Methods Explanation;Demonstration   Comprehension Verbalized understanding          PT Short Term Goals - 01/11/15 0801    PT SHORT TERM GOAL #1   Title Pt will perform HEP with supervision for improved functional mobility, transfers, balance and gait.  TARGET 02/09/15   Time 4   Period Weeks   Status New   PT SHORT TERM GOAL #2   Title Pt will improve 5x sit<>stand to less than or equal to 15 seconds for improved efficiency and safety with transfers.   Time 4   Period Weeks   Status New   PT SHORT TERM GOAL #3   Title Pt will improve TUG cognitive to less than or equal to 15 seconds for decreased fall risk/improved dual tasking with gait.   Time 4   Period Weeks   Status New   PT SHORT TERM GOAL #4   Title Pt will verbalize understanding of local Parkinson's resources.   Time 4   Period Weeks   Status New           PT Long Term Goals - 01/11/15 0808    PT LONG TERM GOAL #1   Title Pt will verbalize understanding of fall prevention within the home environment.  TARGET 03/11/15   Time 6   Period Weeks   Status New   PT LONG TERM GOAL #2   Title Pt will perform at least 8 of 10 reps of sit<>stand transfers from 18 inch surfaces or lower, with minimal to no UE support, for  improved safety and efficiency with transfers.   Time 6   Period Weeks   Status New   PT LONG TERM GOAL #3   Title Pt will improve Functional Gait Assessment to at least 19/30 for decreased fall risk.   Time 6  Period Weeks   Status New   PT LONG TERM GOAL #4   Title Pt will verbalize plans for continued community fitness upon D/C from PT.   Time 6   Period Weeks   Status New               Plan - 01/31/15 1906    Clinical Impression Statement Pt needs cues for intensity with gait.  Continue PT per POC.   Pt will benefit from skilled therapeutic intervention in order to improve on the following deficits Abnormal gait;Decreased balance;Decreased mobility;Decreased strength;Difficulty walking;Postural dysfunction   Rehab Potential Good   PT Frequency 2x / week   PT Duration 6 weeks  plus eval   PT Treatment/Interventions ADLs/Self Care Home Management;Therapeutic activities;Therapeutic exercise;Functional mobility training;Gait training;Balance training;Neuromuscular re-education;Patient/family education   PT Next Visit Plan Gait for intensity and posture;Modified quadruped PWR!   Consulted and Agree with Plan of Care Patient        Problem List Patient Active Problem List   Diagnosis Date Noted  . HLD (hyperlipidemia)   . TIA (transient ischemic attack) 08/09/2014  . Chronic kidney disease 08/09/2014  . Atrial fibrillation ([place]) 08/09/2014  . Right renal mass   . Atrial fibrillation with rapid ventricular response ([place]) 07/17/2014  . Elevated troponin 07/17/2014  . Diverticulosis of colon without hemorrhage 06/16/2013  . Post-operative hypothyroidism 06/16/2013  . Renal oncocytoma 06/09/2012  . LBP (low back pain) 06/09/2012  . Parkinson disease, symptomatic ([place]) 06/06/2011  . CARCINOMA, SKIN, SQUAMOUS CELL 12/10/2007  . MALIGNANT NEOPLASM OF THYROID GLAND 12/10/2007  . SUBDURAL HEMATOMA 12/10/2007  . BPH (benign prostatic hypertrophy) 12/10/2007  . Essential  hypertension 04/22/2007  . Transient cerebral ischemia 04/22/2007  . Generalized atherosclerosis 04/22/2007  . Osteoarthritis 04/22/2007    Narda Bonds 01/31/2015, 7:12 PM  [facility] [address] [place], [name], [address] Phone: [phone]   Fax:  [phone]  Name: NERI SAMEK MRN: [id number] Date of Birth: 1929-03-20    Narda Bonds, PTA [place] [facility] 01/31/2015 7:12 PM Phone: [phone] Fax: [phone]

## 2015-01-31 NOTE — Therapy (Addendum)
[facility] [address] [place], [name], [address] Phone: [phone]   Fax:  [phone]  Occupational Therapy Treatment  Patient Details  Name: William [name] MRN: [id number] Date of Birth: 09-01-1928 No Data Recorded  Encounter Date: 01/31/2015      OT End of Session - 01/31/15 1911    Visit Number 3   Number of Visits 17   Date for OT Re-Evaluation 03/11/15   Authorization Type [employer] H[employer], no visit limit, no auth required   Authorization - Visit Number 3   Authorization - Number of Visits 10   OT Start Time 1320   OT Stop Time 1400   OT Time Calculation (min) 40 min   Activity Tolerance Patient tolerated treatment well   Behavior During Therapy [facility] for tasks assessed/performed      Past Medical History  Diagnosis Date  . Hyperlipidemia   . Hx of colonic polyps   . Benign prostatic hypertrophy with urinary obstruction   . TIA (transient ischemic attack)     pt does not recall this hx "but may have had one today" (08/09/2014)  . Subdural hematoma ([place]) 2006  . Gout   . Hypertension     sees [name]  . Thyroid disease     medullary thyroid   . Pneumonia     hx of in 1952  . Parkinson disease ([place])   . A-fib ([place])   . Complication of anesthesia     "difficulty intubation, had to use fiberoptic 2006"  . Difficult intubation   . 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 Primary skin squamous cell carcinoma   . Basal cell carcinoma   . Hypothyroidism   . DJD (degenerative joint disease)   . Arthritis     "right knee" (08/09/2014)  . Gout     "periodically" (08/09/2014)  . Kidney tumor     "tumor on cyst on kidney"   . DJD (degenerative joint disease)   . Parkinson disease [facility])     Past Surgical History  Procedure Laterality Date  . Cataract extraction w/ intraocular lens  implant, bilateral Bilateral ~ 1998  . Cholecystectomy    . Thyroidectomy       medullary carcinoma  . Eye surgery    . Knee arthroscopy Right   . Appendectomy    . Tonsillectomy    . Lumbar laminectomy/decompression microdiscectomy  05/14/2012    Procedure: LUMBAR LAMINECTOMY/DECOMPRESSION MICRODISCECTOMY 1 LEVEL;  Surgeon: [name], MD;  Location: [place] [facility];  Service: Neurosurgery;  Laterality: Left;  Left Lumbar four-five Laminectomy/Diskectomy/Far lateral diskectomy  . Cryoablation  07-10-12    "tumor on cyst on my kidney; had an ablation"  . Skin cancer excision  "several"    "back of my neck; right eye; clavicle; nose"  . Inguinal hernia repair Right   . Back surgery    . Subdural hematoma evacuation via craniotomy  2006  . Kidney surgery      There were no vitals filed for this visit.  Visit Diagnosis:  Bradykinesia  Rigidity  Lack of coordination  Unsteadiness      Subjective Assessment - 01/31/15 1857    Subjective  Pt reports that he feels like the exercises are helping   Pertinent History PD (dx 2009), 07/2014 a-fib, TIA, hx of R craniotomy (11/2004), TIA (2003), thyroid CA, partial onset seizures   Patient Stated Goals improve ADLs and R hand  use   Currently in Pain? No/denies        PWR! Moves (basic 4) in sitting and supine x20 each with min v.c. For big amplitude movements               OT Treatments/Exercises (OP) - 01/31/15 0001    ADLs   Functional Mobility Pt instructed in how to translate PWR! moves into bed mobility and practiced supine>sit with roll to reduce stress on back and prevent future complications.  Pt returned demo with min cues.  Pt instructed in importance of larger base of support in sitting and standing.   Home Maintenance Pt instructed in use of big movements for functional reach, functional grasp of cylinder objects, and opening jars.  Pt verbalized understanding.   Driving Instructed pt in use of big amplitude movements into car transfers, donning seatbelt.  Pt verbalized understanding.                OT Education - 01/31/15 1858    Education provided Yes   Education Details Reviewed seated PWR! Moves; instructed pt in supine PWR! moves (without hip lift in PWR! step); and instructed pt in importance on exercises with PD and beginning to use big amplitude movements and translate PWR! moves into function/ADLs, affect of PD symptoms on function/ADLs and ways to prevent future complications; PD related community resources (exercise groups, POP community group, webinars, etc)   Person(s) Educated Patient   Methods Explanation;Demonstration;Verbal cues;Handout   Comprehension Verbalized understanding;Returned demonstration;Verbal cues required          OT Short Term Goals - 01/10/15 1703    OT SHORT TERM GOAL #1   Title Pt will be independent with PD-specific HEP.--check STGs 02/08/15   Time 4   Period Weeks   Status New   OT SHORT TERM GOAL #2   Title Pt will be able to write at least 3 sentences with 100% legibility and no significant decrease in size.   Baseline 90%, min decr in size   Time 4   Period Weeks   Status New   OT SHORT TERM GOAL #3   Title Pt will improve coordination for ADLs as shown by improving time on 9-hole peg test by at least 8sec with RUE.   Baseline 60.44   Time 4   Period Weeks   Status New   OT SHORT TERM GOAL #4   Title Pt will improve ability to eat as shown by completing PPT#2 in 12sec or less   Baseline 15.01   Time 4   Period Weeks   Status New   OT SHORT TERM GOAL #5   Title Pt will improve ability to button as shown by ability to button/unbutton 3 buttons with shirt on tabletop in 70sec or less.   Baseline 83.03   Time 4   Period Weeks   Status New   Additional Short Term Goals   Additional Short Term Goals Yes   OT SHORT TERM GOAL #6   Title Pt will verbalize understanding of ways to prevent future complications and appropriate PD-related community resources.   Time 4   Period Weeks   Status New           OT  Long Term Goals - 01/10/15 1707    OT LONG TERM GOAL #1   Title Pt will verbalize understanding of adaptive strategies/AE to incr ease/efficiency with ADLs/IADLs prn.   Time 8   Period Weeks   Status New   OT  LONG TERM GOAL #2   Title Pt will be able to carry glass of liquid at least 68feet without spills x2.   Time 8   Period Weeks   Status New   OT LONG TERM GOAL #3   Title Pt will improve coordination for ADLs as shown by improving time on 9-hole peg test by at least 15sec with RUE.   Baseline 60.44   Time 8   Period Weeks   Status New   OT LONG TERM GOAL #4   Title Pt will improve coordination/functional reaching for ADLs as shown by improving score on box and blocks test by at least 6 bilaterally.   Baseline R-31, L-37 blocks   Time 8   Period Weeks   Status New   OT LONG TERM GOAL #5   Title Pt will improve balance/functional reaching for ADLs as shown by improving standing functional reach test to at least 10" with LUE.   Time 8   Period Weeks   Status New   Long Term Additional Goals   Additional Long Term Goals Yes   OT LONG TERM GOAL #6   Title Pt will improve ability to button as shown by ability to button/unbutton 3 buttons with shirt on tabletop in 60sec or less.   Baseline 55sec   Time 8   Period Weeks   Status New               Plan - 01/31/15 1912    Clinical Impression Statement Pt responds well to cueing for big amplitude movements.  Pt progressing towards goals.   Plan instruct pt in use of big movement amplitude movements for ADLs (handout), coordination/PWR! hands HEP   OT Home Exercise Plan issued: seated PWR!; 01/31/15 PWR! Moves in supine, ways to prevent future complications, community resources   Consulted and Agree with Plan of Care Patient        Problem List Patient Active Problem List   Diagnosis Date Noted  . HLD (hyperlipidemia)   . TIA (transient ischemic attack) 08/09/2014  . Chronic kidney disease 08/09/2014  . Atrial  fibrillation ([place]) 08/09/2014  . Right renal mass   . Atrial fibrillation with rapid ventricular response ([place]) 07/17/2014  . Elevated troponin 07/17/2014  . Diverticulosis of colon without hemorrhage 06/16/2013  . Post-operative hypothyroidism 06/16/2013  . Renal oncocytoma 06/09/2012  . LBP (low back pain) 06/09/2012  . Parkinson disease, symptomatic ([place]) 06/06/2011  . CARCINOMA, SKIN, SQUAMOUS CELL 12/10/2007  . MALIGNANT NEOPLASM OF THYROID GLAND 12/10/2007  . SUBDURAL HEMATOMA 12/10/2007  . BPH (benign prostatic hypertrophy) 12/10/2007  . Essential hypertension 04/22/2007  . Transient cerebral ischemia 04/22/2007  . Generalized atherosclerosis 04/22/2007  . Osteoarthritis 04/22/2007    [facility] 01/31/2015, 7:58 PM  [facility] [address] [place], [name], [address] Phone: [phone]   Fax:  [phone]  Name: MCCAULEY DIEHL MRN: [id number] Date of Birth: [birth year]  Vianne Bulls, OTR/L 01/31/2015 7:58 PM

## 2015-01-31 NOTE — Patient Instructions (Signed)
  Effortful swallow with all solids  Small bites/small sips  Alternate solids with liquids  Crush meds and put in puree as able  SWALLOWING EXERCISES 1. Effortful Swallows - Squeeze hard with the muscles in your neck while you swallow your  saliva or a sip of water - Repeat 20 times, 2-3 times a day, and use whenever you eat or drink  2. [place]ako Swallow - swallow with your tongue sticking out - Stick tongue out and gently bite tongue with your teeth - Swallow, while holding your tongue with your teeth - Repeat 20 times, 2-3 times a day  3. Pitch Raise - Repeat "he", once per second in as high of a pitch as you can - Repeat 20 times, 2-3 times a day  4. Shaker Exercise - head lift - Lie flat on your back in your bed or on a couch without pillows - Raise your head and look at your feet  - KEEP YOUR SHOULDERS DOWN - HOLD FOR 45 SECONDS, then lower your head back down - Repeat 3 times, 2-3 times a day  5. Mendelsohn Maneuver - "half swallow" exercise - Start to swallow, and keep your Adam's apple up by squeezing hard with the muscles of the throat - Hold the squeeze for 5-7 seconds and then relax - Repeat 20 times, 2-3 times a day  6. Tongue Press - Press your entire tongue as hard as you can against the roof of your mouth for 3-5 seconds - Repeat 20 times, 2-3 times a day  7. Tongue Stretch/Teeth Clean - Move your tongue around the pocket between your gums and teeth, clockwise and then counter-clockwise - Repeat on the back side, clockwise and then counter-clockwise - Repeat 15-20 times, 2-3 times a day  8. Breath Hold - Say "HUH!" loudly, holding your breath tightly at the level of your voice box for 3 seconds - Repeat 20 times, 2-3 times a day  9. Chin pushback - Open your mouth  - Place your fist UNDER your chin near your neck, and push back with your fist for 5 seconds  - Repeat 20 times, 2-3 times a day     10. Open mouth swallow          -Swallow with your  mouth open wide          -Repeat 15 times, 2 times a day  11. Stick out your tongue and say "GA-GA-GA" loud and shart           -Repeat 25 sets of 3, 2-3 times a day  12. Say ING-GA loud and exaggerated             - 25 times 2-3 times a day  13. Gargle or pretend to gargle             - 10 times for 3-5 seconds (or as long as you can) 2 times a day

## 2015-01-31 NOTE — Therapy (Signed)
[facility] [address] [place], [name], [address] Phone: [phone]   Fax:  [phone]  Speech Language Pathology Treatment  Patient Details  Name: William [name] MRN: [id number] Date of Birth: 08-26-1928 No Data Recorded  Encounter Date: 01/31/2015      End of Session - 01/31/15 1457    Visit Number 2   Number of Visits 17   Authorization Type humana - sent to LL 01-10-15   SLP Start Time 1404   SLP Stop Time  1448   SLP Time Calculation (min) 44 min   Activity Tolerance Patient tolerated treatment well      Past Medical History  Diagnosis Date  . Hyperlipidemia   . Hx of colonic polyps   . Benign prostatic hypertrophy with urinary obstruction   . TIA (transient ischemic attack)     pt does not recall this hx "but may have had one today" (08/09/2014)  . Subdural hematoma 2006  . Gout   . Hypertension     sees [name]  . Thyroid disease     medullary thyroid   . Pneumonia     hx of in 1952  . Parkinson disease   . A-fib   . Complication of anesthesia     "difficulty intubation, had to use fiberoptic 2006"  . Difficult intubation   . Malignant neoplasm of thyroid gland     medullary carcinoma  . Primary skin squamous cell carcinoma   . Basal cell carcinoma   . Hypothyroidism   . DJD (degenerative joint disease)   . Arthritis     "right knee" (08/09/2014)  . Gout     "periodically" (08/09/2014)  . Kidney tumor     "tumor on cyst on kidney"   . DJD (degenerative joint disease)   . Parkinson disease     Past Surgical History  Procedure Laterality Date  . Cataract extraction w/ intraocular lens  implant, bilateral Bilateral ~ 1998  . Cholecystectomy    . Thyroidectomy      medullary carcinoma  . Eye surgery    . Knee arthroscopy Right   . Appendectomy    . Tonsillectomy    . Lumbar laminectomy/decompression microdiscectomy  05/14/2012    Procedure: LUMBAR  LAMINECTOMY/DECOMPRESSION MICRODISCECTOMY 1 LEVEL;  Surgeon: [name], MD;  Location: [facility];  Service: Neurosurgery;  Laterality: Left;  Left Lumbar four-five Laminectomy/Diskectomy/Far lateral diskectomy  . Cryoablation  07-10-12    "tumor on cyst on my kidney; had an ablation"  . Skin cancer excision  "several"    "back of my neck; right eye; clavicle; nose"  . Inguinal hernia repair Right   . Back surgery    . Subdural hematoma evacuation via craniotomy  2006  . Kidney surgery      There were no vitals filed for this visit.  Visit Diagnosis: Dysarthria  Dysphagia             ADULT SLP TREATMENT - 01/31/15 1420    General Information   Behavior/Cognition Alert;Cooperative;Pleasant mood   Treatment Provided   Treatment provided Dysphagia   Dysphagia Treatment   Temperature Spikes Noted No   Patient observed directly with PO's Yes   Type of PO's observed Thin liquids   Other treatment/comments Initiated dysphagia HEP per MBSS recommendations. Pt required usual min to mod cues and instruction initially for  HEP. Pt continues to required mox A for [place]. Trained pt on swallow protocol. By the  end of the session pt verbalized swallow precautions with occasional min cues. Loud "hey" average of  95dB..    Pain Assessment   Pain Assessment No/denies pain   Cognitive-Linquistic Treatment   Treatment focused on Dysarthria   Skilled Treatment Loud "hey" average of 85dB with minimal verbal cues. Hoarseness did reduce with loud "hey" Simple conversation with average of 69dB with occasional visual and verbal cues for increased loudness    Assessment / Recommendations / Plan   Plan Continue with current plan of care   Progression Toward Goals   Progression toward goals Progressing toward goals          SLP Education - 01/31/15 1419    Education provided Yes   Education Details Swallow precautions, swallow HEP   Person(s) Educated Patient   Methods  Explanation;Demonstration;Handout;Verbal cues   Comprehension Verbalized understanding;Returned demonstration;Need further instruction          SLP Short Term Goals - 01/31/15 1457    SLP SHORT TERM GOAL #1   Title pt to maintain loud /a/ average 82dB over 3 sessions   Time 3   Period Weeks   Status On-going   SLP SHORT TERM GOAL #2   Title pt will maintain volume of average 70dB in 12 minutes simple/mod complex conversation over two sessions   Time 3   Period Weeks   Status On-going   SLP SHORT TERM GOAL #3   Title (if necessary) pt will complete swallowing HEP following possible modified barium swallow exam with rare min A   Time 3   Period Weeks   Status On-going          SLP Long Term Goals - 01/31/15 1457    SLP LONG TERM GOAL #1   Title pt will maintain loud /a/ average 83dB over 4 sessions   Time 7   Period Weeks   Status On-going   SLP LONG TERM GOAL #2   Title pt will engage in mod complex conversation of 10 minutes with average 71dB with rare min A   Time 7   Status On-going   SLP LONG TERM GOAL #3   Title (if necessary) pt will complete swallowing HEP with modifed independence   Time 7   Period Weeks   Status On-going   SLP LONG TERM GOAL #4   Title pt will tell SLP 3 s/s aspiration PNA with modified independence   Time 7   Period Weeks   Status On-going          Plan - 01/31/15 1454    Clinical Impression Statement MBSS revealed oropharyngeal dysphagia with pharyngeal residue of solids and pill due to base of tongue and hyolaryngeal weakness. Initiated dysphagia HEP and swallow precautions per MBSS. Pt required occasoinal min cues to increase loudess duirng simple conversation to 69dB. Continue skilled ST to maximize safety of swallow and carryover of  adequate volume./intelligiblity./   Treatment/Interventions Aspiration precaution training;Pharyngeal strengthening exercises;Oral motor exercises;SLP instruction and feedback;Internal/external  aids;Compensatory strategies;Patient/family education;Functional tasks;Cueing hierarchy   Potential to Achieve Goals Good        Problem List Patient Active Problem List   Diagnosis Date Noted  . HLD (hyperlipidemia)   . TIA (transient ischemic attack) 08/09/2014  . Chronic kidney disease 08/09/2014  . Atrial fibrillation ([place]) 08/09/2014  . Right renal mass   . Atrial fibrillation with rapid ventricular response ([place]) 07/17/2014  . Elevated troponin 07/17/2014  . Diverticulosis of colon without hemorrhage 06/16/2013  . Post-operative hypothyroidism 06/16/2013  .  Renal oncocytoma 06/09/2012  . LBP (low back pain) 06/09/2012  . Parkinson disease, symptomatic ([place]) 06/06/2011  . CARCINOMA, SKIN, SQUAMOUS CELL 12/10/2007  . MALIGNANT NEOPLASM OF THYROID GLAND 12/10/2007  . SUBDURAL HEMATOMA 12/10/2007  . BPH (benign prostatic hypertrophy) 12/10/2007  . Essential hypertension 04/22/2007  . Transient cerebral ischemia 04/22/2007  . Generalized atherosclerosis 04/22/2007  . Osteoarthritis 04/22/2007    Elida Harbin, Annye Rusk MS, CCC-SLP 01/31/2015, 2:58 PM  [facility] [address] [place], [name], [address] Phone: [phone]   Fax:  [phone]   Name: William [name] MRN: [id number] Date of Birth: [birth year]

## 2015-02-03 ENCOUNTER — Ambulatory Visit: Payer: Commercial Managed Care - HMO | Admitting: Physical Therapy

## 2015-02-03 ENCOUNTER — Ambulatory Visit: Payer: Commercial Managed Care - HMO

## 2015-02-03 ENCOUNTER — Ambulatory Visit: Payer: Commercial Managed Care - HMO | Admitting: Occupational Therapy

## 2015-02-03 DIAGNOSIS — R269 Unspecified abnormalities of gait and mobility: Secondary | ICD-10-CM

## 2015-02-03 DIAGNOSIS — R1310 Dysphagia, unspecified: Secondary | ICD-10-CM | POA: Diagnosis not present

## 2015-02-03 DIAGNOSIS — R2681 Unsteadiness on feet: Secondary | ICD-10-CM | POA: Diagnosis not present

## 2015-02-03 DIAGNOSIS — R29898 Other symptoms and signs involving the musculoskeletal system: Secondary | ICD-10-CM

## 2015-02-03 DIAGNOSIS — R258 Other abnormal involuntary movements: Secondary | ICD-10-CM

## 2015-02-03 DIAGNOSIS — R279 Unspecified lack of coordination: Secondary | ICD-10-CM

## 2015-02-03 DIAGNOSIS — R471 Dysarthria and anarthria: Secondary | ICD-10-CM

## 2015-02-03 NOTE — Therapy (Signed)
[facility] [address] [place], [name], [address] Phone: [phone]   Fax:  [phone]  Speech Language Pathology Treatment  Patient Details  Name: William [name] MRN: [id number] Date of Birth: [birth year] No Data Recorded  Encounter Date: 02/03/2015      End of Session - 02/03/15 1111    Visit Number 3   Number of Visits 50   SLP Start Time 0933   SLP Stop Time  1016   SLP Time Calculation (min) 43 min   Activity Tolerance Patient tolerated treatment well      Past Medical History  Diagnosis Date  . Hyperlipidemia   . Hx of colonic polyps   . Benign prostatic hypertrophy with urinary obstruction   . TIA (transient ischemic attack)     pt does not recall this hx "but may have had one today" (08/09/2014)  . Subdural hematoma ([place]) 2006  . Gout   . Hypertension     sees [name]  . Thyroid disease     medullary thyroid   . Pneumonia     hx of in 1952  . Parkinson disease ([facility])   . A-fib ([facility])   . Complication of anesthesia     "difficulty intubation, had to use fiberoptic 2006"  . Difficult intubation   . 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 Primary skin squamous cell carcinoma   . Basal cell carcinoma   . Hypothyroidism   . DJD (degenerative joint disease)   . Arthritis     "right knee" (08/09/2014)  . Gout     "periodically" (08/09/2014)  . Kidney tumor     "tumor on cyst on kidney"   . DJD (degenerative joint disease)   . Parkinson disease [facility])     Past Surgical History  Procedure Laterality Date  . Cataract extraction w/ intraocular lens  implant, bilateral Bilateral ~ 1998  . Cholecystectomy    . Thyroidectomy      medullary carcinoma  . Eye surgery    . Knee arthroscopy Right   . Appendectomy    . Tonsillectomy    . Lumbar laminectomy/decompression microdiscectomy  05/14/2012    Procedure: LUMBAR LAMINECTOMY/DECOMPRESSION MICRODISCECTOMY 1  LEVEL;  Surgeon: [name], MD;  Location: [facility];  Service: Neurosurgery;  Laterality: Left;  Left Lumbar four-five Laminectomy/Diskectomy/Far lateral diskectomy  . Cryoablation  07-10-12    "tumor on cyst on my kidney; had an ablation"  . Skin cancer excision  "several"    "back of my neck; right eye; clavicle; nose"  . Inguinal hernia repair Right   . Back surgery    . Subdural hematoma evacuation via craniotomy  2006  . Kidney surgery      There were no vitals filed for this visit.  Visit Diagnosis: Dysarthria  Dysphagia      Subjective Assessment - 02/03/15 0942    Subjective "I can't pulverize some medications but fortunately those are small."               ADULT SLP TREATMENT - 02/03/15 0942    General Information   Behavior/Cognition Alert;Cooperative;Pleasant mood   Treatment Provided   Treatment provided Dysphagia   Dysphagia Treatment   Temperature Spikes Noted No   Respiratory Status Room air   Other treatment/comments Pt performed HEP with occasional min-mod A faded to rare min A by end of session. Pt was able to perform [facility] without  cues by end of session. Loud "hey" and extended loud /a/ (2-3 seconds) with good success x10 reps, and x4 reps respectively. Pt to cont with "Hey!" at home, loudly.   Pain Assessment   Pain Assessment No/denies pain   Assessment / Recommendations / Plan   Plan Continue with current plan of care   Progression Toward Goals   Progression toward goals Progressing toward goals          SLP Education - 02/03/15 1111    Education provided Yes   Education Details HEP   Person(s) Educated Patient   Methods Explanation;Demonstration   Comprehension Verbalized understanding;Verbal cues required;Need further instruction          SLP Short Term Goals - 02/03/15 1118    SLP SHORT TERM GOAL #1   Title pt to maintain loud /a/ average 82dB over 3 sessions   Time 4   Period Weeks   Status On-going   SLP SHORT  TERM GOAL #2   Title pt will maintain volume of average 70dB in 12 minutes simple/mod complex conversation over two sessions   Time 4   Period Weeks   Status On-going   SLP SHORT TERM GOAL #3   Title (if necessary) pt will complete swallowing HEP following possible modified barium swallow exam with rare min A   Time 4   Period Weeks   Status On-going          SLP Long Term Goals - 02/03/15 1118    SLP LONG TERM GOAL #1   Title pt will maintain loud /a/ average 83dB over 4 sessions   Time 8   Period Weeks   Status On-going   SLP LONG TERM GOAL #2   Title pt will engage in mod complex conversation of 10 minutes with average 71dB with rare min A   Time 8   Status On-going   SLP LONG TERM GOAL #3   Title (if necessary) pt will complete swallowing HEP with modifed independence   Time 8   Period Weeks   Status On-going   SLP LONG TERM GOAL #4   Title pt will tell SLP 3 s/s aspiration PNA with modified independence   Time 8   Period Weeks   Status On-going          Plan - 02/03/15 1112    Clinical Impression Statement Pt with faded cues necessary with HEP today. Pt cont to need loud "hey" instead of loud /a/ due to vocal strain. Skilled ST needed to cont to assess proper procedure with HEP as well as carryover of louder, WNL speech.   Speech Therapy Frequency 2x / week   Duration --  7 weeks   Treatment/Interventions Aspiration precaution training;Pharyngeal strengthening exercises;Oral motor exercises;SLP instruction and feedback;Internal/external aids;Compensatory strategies;Patient/family education;Functional tasks;Cueing hierarchy   Potential to Achieve Goals Good        Problem List Patient Active Problem List   Diagnosis Date Noted  . HLD (hyperlipidemia)   . TIA (transient ischemic attack) 08/09/2014  . Chronic kidney disease 08/09/2014  . Atrial fibrillation ([place]) 08/09/2014  . Right renal mass   . Atrial fibrillation with rapid ventricular response ([place])  07/17/2014  . Elevated troponin 07/17/2014  . Diverticulosis of colon without hemorrhage 06/16/2013  . Post-operative hypothyroidism 06/16/2013  . Renal oncocytoma 06/09/2012  . LBP (low back pain) 06/09/2012  . Parkinson disease, symptomatic ([place]) 06/06/2011  . CARCINOMA, SKIN, SQUAMOUS CELL 12/10/2007  . MALIGNANT NEOPLASM OF THYROID GLAND 12/10/2007  .  SUBDURAL HEMATOMA 12/10/2007  . BPH (benign prostatic hypertrophy) 12/10/2007  . Essential hypertension 04/22/2007  . Transient cerebral ischemia 04/22/2007  . Generalized atherosclerosis 04/22/2007  . Osteoarthritis 04/22/2007    [facility] , MS, CCC-SLP  02/03/2015, 11:19 AM  [facility] [address] [place], [name], [address] Phone: [phone]   Fax:  [phone]   Name: William [name] MRN: [id number] Date of Birth: 06-Jan-1929

## 2015-02-03 NOTE — Patient Instructions (Signed)
Cont to perform the exercises Mickel Baas provided for you last time.  Continue the loud "Hey!" x10, twice a day.

## 2015-02-03 NOTE — Patient Instructions (Signed)
PWR! Hands  With arms stretched out in front of you (elbows straight), perform the following:  PWR! Up: Close hands and flick fingers open and apart BIG  PWR! Rock: Move wrists up and down [employer]! Twist: Twist palms up and down BIG  PWR! Step: Touch index finger to thumb while keeping other fingers straight. Flick fingers out BIG (thumb out/straighten fingers). Repeat with other fingers. (Step your thumb to each finger).  PWR! Hands: Push hands out BIG. Elbows straight, wrists up, fingers open and spread apart BIG. (Can also perform by pushing down on table, chair, knees. Push above head, out to the side, behind you, in front of you.)  Then, start with elbows bent and hands closed.  PWR! Hands: Push hands out BIG. Elbows straight, wrists up, fingers open and spread apart BIG. (Can also perform by pushing down on table, chair, knees. Push above head, out to the side, behind you, in front of you.)   ** Make each movement big and deliberate so that you feel the movement.  Perform at least 10 repetitions 1x/day, but perform PWR! hands throughout the day when you are having trouble using your hands (picking up/manipulating small objects, writing, eating, typing, sewing, buttoning, etc.).    Coordination Exercises  Perform the following exercises for 41minutes 1-2 times per day. Perform with both hand(s). Perform using big movements.   Flipping Cards: Place deck of cards on the table. Flip cards over by opening your hand big to grasp and then turn your palm up big.  Deal cards: Hold 1/2 or whole deck in your hand. Use thumb to push card off top of deck with one big push.  Flip card between each finger.  Rotate ball with fingertips: Pick up with fingers/thumb and move as much as you can with each turn/movement (clockwise and counter-clockwise).  Toss ball from one hand to the other: Toss big/high.  Rotate 2 golf balls in your hand: Both directions.  Pick up coins and place in  coin bank or container: Pick up with big, intentional movements. Do not drag coin to the edge.  Pick up coins and stack one at a time: Pick up with big, intentional movements. Do not drag coin to the edge. (5-10 in a stack)  Pick up 5-10 coins one at a time and hold in palm. Then, move coins from palm to fingertips one at time and place in coin bank/container.  Pick up 5-10 coins one at a time and hold in palm. Then, move coins from palm to fingertips one at a time to stack.  Practice writing: Slow down, write big, and focus on forming each letter.  Practice typing.  Perform "Flicks"/hand stretches (PWR! Hands): Close hands then flick out your fingers with focus on opening hands, pulling wrists back, and extending elbows like you are pushing.  Pick up various small objects that are different shapes/sizes and place in container. (paperclips, buttons, keys, dried beans/pasta, coins, screws, nuts/bolts, washers, board game pieces, etc.)  Fasten nuts/bolts or put on bottle caps: Turn as much/as big as you can with each turn.

## 2015-02-03 NOTE — Therapy (Signed)
[facility] [address] [place], [name], [address] Phone: [phone]   Fax:  [phone]  Physical Therapy Treatment  Patient Details  Name: William [name] MRN: [id number] Date of Birth: Dec 06, 1928 No Data Recorded  Encounter Date: 02/03/2015      PT End of Session - 02/03/15 1217    Visit Number 4   Number of Visits 13   Date for PT Re-Evaluation 03/11/15   Authorization Type [employer] H[employer]-G-code every 10th visit   PT Start Time 1019   PT Stop Time 1100   PT Time Calculation (min) 41 min   Equipment Utilized During Treatment Gait belt  on treadmill   Activity Tolerance Patient tolerated treatment well   Behavior During Therapy [facility] for tasks assessed/performed      Past Medical History  Diagnosis Date  . Hyperlipidemia   . Hx of colonic polyps   . Benign prostatic hypertrophy with urinary obstruction   . TIA (transient ischemic attack)     pt does not recall this hx "but may have had one today" (08/09/2014)  . Subdural hematoma ([place]) 2006  . Gout   . Hypertension     sees [name]  . Thyroid disease     medullary thyroid   . Pneumonia     hx of in 1952  . Parkinson disease ([place])   . A-fib ([place])   . Complication of anesthesia     "difficulty intubation, had to use fiberoptic 2006"  . Difficult intubation   . 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 Primary skin squamous cell carcinoma   . Basal cell carcinoma   . Hypothyroidism   . DJD (degenerative joint disease)   . Arthritis     "right knee" (08/09/2014)  . Gout     "periodically" (08/09/2014)  . Kidney tumor     "tumor on cyst on kidney"   . DJD (degenerative joint disease)   . Parkinson disease [facility])     Past Surgical History  Procedure Laterality Date  . Cataract extraction w/ intraocular lens  implant, bilateral Bilateral ~ 1998  . Cholecystectomy    . Thyroidectomy      medullary carcinoma  .  Eye surgery    . Knee arthroscopy Right   . Appendectomy    . Tonsillectomy    . Lumbar laminectomy/decompression microdiscectomy  05/14/2012    Procedure: LUMBAR LAMINECTOMY/DECOMPRESSION MICRODISCECTOMY 1 LEVEL;  Surgeon: [name], MD;  Location: [place] NEURO ORS;  Service: Neurosurgery;  Laterality: Left;  Left Lumbar four-five Laminectomy/Diskectomy/Far lateral diskectomy  . Cryoablation  07-10-12    "tumor on cyst on my kidney; had an ablation"  . Skin cancer excision  "several"    "back of my neck; right eye; clavicle; nose"  . Inguinal hernia repair Right   . Back surgery    . Subdural hematoma evacuation via craniotomy  2006  . Kidney surgery      There were no vitals filed for this visit.  Visit Diagnosis:  Abnormality of gait  Bradykinesia      Subjective Assessment - 02/03/15 1020    Subjective Denies falls or changes since the last visit.  Been trying to squeeze in some of my exercises.   Currently in Pain? No/denies                         [facility] Adult PT Treatment/Exercise - 02/03/15  1034    Ambulation/Gait   Ambulation/Gait Yes   Ambulation/Gait Assistance 5: Supervision   Ambulation Distance (Feet) 600 Feet   Assistive device None   Gait Pattern Step-through pattern;Decreased arm swing - right;Decreased step length - right;Decreased dorsiflexion - right;Decreased dorsiflexion - left;Decreased trunk rotation;Narrow base of support;Poor foot clearance - right;Decreased step length - left;Poor foot clearance - left   Ambulation Surface Level;Indoor   Gait Comments Treadmill walking at 2 mph, with 30 seconds at 0.8 mph with kicking ball for increased step length and foot clearance; pt uses bilateral UE support and cues provided for increased step length and foot clearance; cognitive activity with head turns during gait-decr. step length and foot clearance noted   High Level Balance   High Level Balance Activities Backward walking  /Forward walking  15 ft, x 4 reps, cues for arm swing   High Level Balance Comments In parallel bars on foam surface:  marching in place x 10 reps, forward kicks x 10 reps, forward step taps x 10 reps with UE support; standing on foam with eyes open with head turns and nods x 10 reps each, then eyes closed x 10 seconds, 2 reps with progressively less UE support.  Pt requires supervision, occasional min guard assistance due to increased lean to the R when eyes closed and with head turns/nods.             PWR [facility]) - 02/03/15 1037    PWR! Up x 20    PWR! Rock x 20    PWR! Twist x 20    PWR Step  x20    Basic 4 Flow     Comments forward/back step and weightshift x 10 reps each; review of HEP-pt able to perform with minimal cues for technique    PWR! Moves standing for posture, weightshifting, trunk rotation and step initiation.           PT Short Term Goals - 01/11/15 0801    PT SHORT TERM GOAL #1   Title Pt will perform HEP with supervision for improved functional mobility, transfers, balance and gait.  TARGET 02/09/15   Time 4   Period Weeks   Status New   PT SHORT TERM GOAL #2   Title Pt will improve 5x sit<>stand to less than or equal to 15 seconds for improved efficiency and safety with transfers.   Time 4   Period Weeks   Status New   PT SHORT TERM GOAL #3   Title Pt will improve TUG cognitive to less than or equal to 15 seconds for decreased fall risk/improved dual tasking with gait.   Time 4   Period Weeks   Status New   PT SHORT TERM GOAL #4   Title Pt will verbalize understanding of local Parkinson's resources.   Time 4   Period Weeks   Status New           PT Long Term Goals - 01/11/15 0808    PT LONG TERM GOAL #1   Title Pt will verbalize understanding of fall prevention within the home environment.  TARGET 03/11/15   Time 6   Period Weeks   Status New   PT LONG TERM GOAL #2   Title Pt will perform at least 8 of 10 reps of sit<>stand transfers from 18 inch surfaces  or lower, with minimal to no UE support, for improved safety and efficiency with transfers.   Time 6   Period Weeks   Status New  PT LONG TERM GOAL #3   Title Pt will improve Functional Gait Assessment to at least 19/30 for decreased fall risk.   Time 6   Period Weeks   Status New   PT LONG TERM GOAL #4   Title Pt will verbalize plans for continued community fitness upon D/C from PT.   Time 6   Period Weeks   Status New               Plan - 02/03/15 1218    Clinical Impression Statement Pt has inconsistent R and L foot clearance during gait on ground after treadmill.  Pt is progressing with performance of standing HEP.  After gait and review of PWR! exercises today, focus of treatment was balance related per patient request to work on balance; therefore, quadruped PWR! note yet initiated.  Pt will continue to benefit from further skilled PT to address balance, gait, and posture/transfer exercises.   Pt will benefit from skilled therapeutic intervention in order to improve on the following deficits Abnormal gait;Decreased balance;Decreased mobility;Decreased strength;Difficulty walking;Postural dysfunction   Rehab Potential Good   PT Frequency 2x / week   PT Duration 6 weeks  plus eval   PT Treatment/Interventions ADLs/Self Care Home Management;Therapeutic activities;Therapeutic exercise;Functional mobility training;Gait training;Balance training;Neuromuscular re-education;Patient/family education   PT Next Visit Plan Gait for intensity and posture;Modified quadruped PWR!; continue balance activities on compliant surfaces   Consulted and Agree with Plan of Care Patient    PLAN continued:  CHECK STGS next week-add appts if needed.    Problem List Patient Active Problem List   Diagnosis Date Noted  . HLD (hyperlipidemia)   . TIA (transient ischemic attack) 08/09/2014  . Chronic kidney disease 08/09/2014  . Atrial fibrillation ([place]) 08/09/2014  . Right renal mass   .  Atrial fibrillation with rapid ventricular response ([place]) 07/17/2014  . Elevated troponin 07/17/2014  . Diverticulosis of colon without hemorrhage 06/16/2013  . Post-operative hypothyroidism 06/16/2013  . Renal oncocytoma 06/09/2012  . LBP (low back pain) 06/09/2012  . Parkinson disease, symptomatic ([place]) 06/06/2011  . CARCINOMA, SKIN, SQUAMOUS CELL 12/10/2007  . MALIGNANT NEOPLASM OF THYROID GLAND 12/10/2007  . SUBDURAL HEMATOMA 12/10/2007  . BPH (benign prostatic hypertrophy) 12/10/2007  . Essential hypertension 04/22/2007  . Transient cerebral ischemia 04/22/2007  . Generalized atherosclerosis 04/22/2007  . Osteoarthritis 04/22/2007    Chantelle Verdi W. 02/03/2015, 12:23 PM  [facility] [address] [place], [name], [address] Phone: [phone]   Fax:  [phone]  Name: William [name] MRN: [id number] Date of Birth: [birth year]

## 2015-02-03 NOTE — Therapy (Signed)
[facility] [address] [place], [name], [address] Phone: [phone]   Fax:  [phone]  Occupational Therapy Treatment  Patient Details  Name: William [name] MRN: [id number] Date of Birth: November 18, 1928 No Data Recorded  Encounter Date: 02/03/2015      OT End of Session - 02/03/15 1122    Visit Number 4   Number of Visits 17   Date for OT Re-Evaluation 03/11/15   Authorization Type [employer] H[employer], no visit limit, no auth required   Authorization - Visit Number 4   Authorization - Number of Visits 10   OT Start Time 1103   OT Stop Time 1145   OT Time Calculation (min) 42 min   Activity Tolerance Patient tolerated treatment well   Behavior During Therapy [facility] for tasks assessed/performed      Past Medical History  Diagnosis Date  . Hyperlipidemia   . Hx of colonic polyps   . Benign prostatic hypertrophy with urinary obstruction   . TIA (transient ischemic attack)     pt does not recall this hx "but may have had one today" (08/09/2014)  . Subdural hematoma ([place]) 2006  . Gout   . Hypertension     sees [name]  . Thyroid disease     medullary thyroid   . Pneumonia     hx of in 1952  . Parkinson disease ([place])   . A-fib ([place])   . Complication of anesthesia     "difficulty intubation, had to use fiberoptic 2006"  . Difficult intubation   . 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 Primary skin squamous cell carcinoma   . Basal cell carcinoma   . Hypothyroidism   . DJD (degenerative joint disease)   . Arthritis     "right knee" (08/09/2014)  . Gout     "periodically" (08/09/2014)  . Kidney tumor     "tumor on cyst on kidney"   . DJD (degenerative joint disease)   . Parkinson disease [facility])     Past Surgical History  Procedure Laterality Date  . Cataract extraction w/ intraocular lens  implant, bilateral Bilateral ~ 1998  . Cholecystectomy    . Thyroidectomy     medullary carcinoma  . Eye surgery    . Knee arthroscopy Right   . Appendectomy    . Tonsillectomy    . Lumbar laminectomy/decompression microdiscectomy  05/14/2012    Procedure: LUMBAR LAMINECTOMY/DECOMPRESSION MICRODISCECTOMY 1 LEVEL;  Surgeon: [name], MD;  Location: [place] NEURO ORS;  Service: Neurosurgery;  Laterality: Left;  Left Lumbar four-five Laminectomy/Diskectomy/Far lateral diskectomy  . Cryoablation  07-10-12    "tumor on cyst on my kidney; had an ablation"  . Skin cancer excision  "several"    "back of my neck; right eye; clavicle; nose"  . Inguinal hernia repair Right   . Back surgery    . Subdural hematoma evacuation via craniotomy  2006  . Kidney surgery      There were no vitals filed for this visit.  Visit Diagnosis:  Lack of coordination  Bradykinesia  Rigidity      Subjective Assessment - 02/03/15 1116    Pertinent History PD (dx 2009), 07/2014 a-fib, TIA, hx of R craniotomy (11/2004), TIA (2003), thyroid CA, partial onset seizures   Patient Stated Goals improve ADLs and R hand use   Currently in Pain? No/denies  Pt was instructed in coordination HEP, he returned demonstration following min-mod v.c. And demonstration. Arm bike x 5 mins level 1 for conditioning. Pt was able to maintain 40 RPM.            PWR [facility]) - 02/03/15 1037 PWR ! Hands basic 4 5-10 rps each, min -mod v.c.          OT Education - 02/03/15 1124    Education provided Yes   Education Details PWR ! hand, coordination HEP   Person(s) Educated Patient   Methods Explanation;Demonstration   Comprehension Verbalized understanding;Returned demonstration          OT Short Term Goals - 01/10/15 1703    OT SHORT TERM GOAL #1   Title Pt will be independent with PD-specific HEP.--check STGs 02/08/15   Time 4   Period Weeks   Status New   OT SHORT TERM GOAL #2   Title Pt will be able to write at least 3 sentences with 100% legibility and no  significant decrease in size.   Baseline 90%, min decr in size   Time 4   Period Weeks   Status New   OT SHORT TERM GOAL #3   Title Pt will improve coordination for ADLs as shown by improving time on 9-hole peg test by at least 8sec with RUE.   Baseline 60.44   Time 4   Period Weeks   Status New   OT SHORT TERM GOAL #4   Title Pt will improve ability to eat as shown by completing PPT#2 in 12sec or less   Baseline 15.01   Time 4   Period Weeks   Status New   OT SHORT TERM GOAL #5   Title Pt will improve ability to button as shown by ability to button/unbutton 3 buttons with shirt on tabletop in 70sec or less.   Baseline 83.03   Time 4   Period Weeks   Status New   Additional Short Term Goals   Additional Short Term Goals Yes   OT SHORT TERM GOAL #6   Title Pt will verbalize understanding of ways to prevent future complications and appropriate PD-related community resources.   Time 4   Period Weeks   Status New           OT Long Term Goals - 01/10/15 1707    OT LONG TERM GOAL #1   Title Pt will verbalize understanding of adaptive strategies/AE to incr ease/efficiency with ADLs/IADLs prn.   Time 8   Period Weeks   Status New   OT LONG TERM GOAL #2   Title Pt will be able to carry glass of liquid at least 1feet without spills x2.   Time 8   Period Weeks   Status New   OT LONG TERM GOAL #3   Title Pt will improve coordination for ADLs as shown by improving time on 9-hole peg test by at least 15sec with RUE.   Baseline 60.44   Time 8   Period Weeks   Status New   OT LONG TERM GOAL #4   Title Pt will improve coordination/functional reaching for ADLs as shown by improving score on box and blocks test by at least 6 bilaterally.   Baseline R-31, L-37 blocks   Time 8   Period Weeks   Status New   OT LONG TERM GOAL #5   Title Pt will improve balance/functional reaching for ADLs as shown by improving standing functional reach test to at least  10" with LUE.   Time 8    Period Weeks   Status New   Long Term Additional Goals   Additional Long Term Goals Yes   OT LONG TERM GOAL #6   Title Pt will improve ability to button as shown by ability to button/unbutton 3 buttons with shirt on tabletop in 60sec or less.   Baseline 55sec   Time 8   Period Weeks   Status New               Plan - 02/03/15 1120    Clinical Impression Statement Pt is progressing towards goals for coordination.   Pt will benefit from skilled therapeutic intervention in order to improve on the following deficits (Retired) Decreased mobility;Impaired UE functional use;Decreased knowledge of use of DME;Decreased balance;Decreased activity tolerance;Impaired tone;Decreased coordination   Rehab Potential Good   OT Frequency 2x / week   OT Duration 8 weeks   OT Treatment/Interventions Self-care/ADL training;Cryotherapy;Moist Heat;Passive range of motion;Fluidtherapy;Therapeutic activities;DME and/or AE instruction;Therapeutic exercises;Splinting;Manual Therapy;Neuromuscular education;Ultrasound;Therapeutic exercise;Functional Mobility Training;Patient/family education   Plan big movments for ADLs   OT Home Exercise Plan issued: seated PWR!; 01/31/15 PWR! Moves in supine, ways to prevent future complications, community resources   Consulted and Agree with Plan of Care Patient        Problem List Patient Active Problem List   Diagnosis Date Noted  . HLD (hyperlipidemia)   . TIA (transient ischemic attack) 08/09/2014  . Chronic kidney disease 08/09/2014  . Atrial fibrillation ([place]) 08/09/2014  . Right renal mass   . Atrial fibrillation with rapid ventricular response ([place]) 07/17/2014  . Elevated troponin 07/17/2014  . Diverticulosis of colon without hemorrhage 06/16/2013  . Post-operative hypothyroidism 06/16/2013  . Renal oncocytoma 06/09/2012  . LBP (low back pain) 06/09/2012  . Parkinson disease, symptomatic ([place]) 06/06/2011  . CARCINOMA, SKIN, SQUAMOUS CELL 12/10/2007  .  MALIGNANT NEOPLASM OF THYROID GLAND 12/10/2007  . SUBDURAL HEMATOMA 12/10/2007  . BPH (benign prostatic hypertrophy) 12/10/2007  . Essential hypertension 04/22/2007  . Transient cerebral ischemia 04/22/2007  . Generalized atherosclerosis 04/22/2007  . Osteoarthritis 04/22/2007    Saliha Salts 02/03/2015, 11:44 AM Theone Murdoch, OTR/L [phone] Phone: [phone] 11:45 AM 02/03/2015 [facility] [address] [place], [name], [address] Phone: [phone]   Fax:  [phone]  Name: GOKU HARB MRN: [id number] Date of Birth: 12-23-1928

## 2015-02-07 ENCOUNTER — Ambulatory Visit: Payer: Commercial Managed Care - HMO

## 2015-02-07 ENCOUNTER — Ambulatory Visit: Payer: Commercial Managed Care - HMO | Admitting: Occupational Therapy

## 2015-02-07 DIAGNOSIS — R471 Dysarthria and anarthria: Secondary | ICD-10-CM

## 2015-02-07 DIAGNOSIS — R2681 Unsteadiness on feet: Secondary | ICD-10-CM | POA: Diagnosis not present

## 2015-02-07 DIAGNOSIS — R269 Unspecified abnormalities of gait and mobility: Secondary | ICD-10-CM

## 2015-02-07 DIAGNOSIS — R279 Unspecified lack of coordination: Secondary | ICD-10-CM

## 2015-02-07 DIAGNOSIS — R258 Other abnormal involuntary movements: Secondary | ICD-10-CM

## 2015-02-07 DIAGNOSIS — R29898 Other symptoms and signs involving the musculoskeletal system: Secondary | ICD-10-CM | POA: Diagnosis not present

## 2015-02-07 DIAGNOSIS — R1310 Dysphagia, unspecified: Secondary | ICD-10-CM

## 2015-02-07 NOTE — Therapy (Signed)
[facility] [address] [place], [name], [address] Phone: [phone]   Fax:  [phone]  Occupational Therapy Treatment  Patient Details  Name: William [name] MRN: [id number] Date of Birth: Nov 12, 1928 No Data Recorded  Encounter Date: 02/07/2015      OT End of Session - 02/07/15 1033    Visit Number 5   Number of Visits 17   Date for OT Re-Evaluation 03/11/15   Authorization Type [employer] H[employer], no visit limit, no auth required   Authorization - Visit Number 5   Authorization - Number of Visits 10   OT Start Time [phone]   OT Stop Time 1017   OT Time Calculation (min) 41 min   Activity Tolerance Patient tolerated treatment well   Behavior During Therapy [facility] for tasks assessed/performed      Past Medical History  Diagnosis Date  . Hyperlipidemia   . Hx of colonic polyps   . Benign prostatic hypertrophy with urinary obstruction   . TIA (transient ischemic attack)     pt does not recall this hx "but may have had one today" (08/09/2014)  . Subdural hematoma ([place]) 2006  . Gout   . Hypertension     sees [name]  . Thyroid disease     medullary thyroid   . Pneumonia     hx of in 1952  . Parkinson disease ([place])   . A-fib ([place])   . Complication of anesthesia     "difficulty intubation, had to use fiberoptic 2006"  . Difficult intubation   . 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 Primary skin squamous cell carcinoma   . Basal cell carcinoma   . Hypothyroidism   . DJD (degenerative joint disease)   . Arthritis     "right knee" (08/09/2014)  . Gout     "periodically" (08/09/2014)  . Kidney tumor     "tumor on cyst on kidney"   . DJD (degenerative joint disease)   . Parkinson disease [facility])     Past Surgical History  Procedure Laterality Date  . Cataract extraction w/ intraocular lens  implant, bilateral Bilateral ~ 1998  . Cholecystectomy    . Thyroidectomy     medullary carcinoma  . Eye surgery    . Knee arthroscopy Right   . Appendectomy    . Tonsillectomy    . Lumbar laminectomy/decompression microdiscectomy  05/14/2012    Procedure: LUMBAR LAMINECTOMY/DECOMPRESSION MICRODISCECTOMY 1 LEVEL;  Surgeon: [name], MD;  Location: [place] [facility];  Service: Neurosurgery;  Laterality: Left;  Left Lumbar four-five Laminectomy/Diskectomy/Far lateral diskectomy  . Cryoablation  07-10-12    "tumor on cyst on my kidney; had an ablation"  . Skin cancer excision  "several"    "back of my neck; right eye; clavicle; nose"  . Inguinal hernia repair Right   . Back surgery    . Subdural hematoma evacuation via craniotomy  2006  . Kidney surgery      There were no vitals filed for this visit.  Visit Diagnosis:  Bradykinesia  Rigidity  Unsteadiness  Lack of coordination      Subjective Assessment - 02/07/15 0940    Subjective  Pt reports that he can use his thumb to scoll on his phone now.  "I think buttons are better"   Pertinent History PD (dx 2009), 07/2014 a-fib, TIA, hx of R craniotomy (11/2004), TIA (2003), thyroid CA, partial onset seizures  Patient Stated Goals improve ADLs and R hand use   Currently in Pain? No/denies                      OT Treatments/Exercises (OP) - 02/07/15 0001    ADLs   UB Dressing Practiced donning/doffing jacket with big movments with min v.c. initially, but demo good response to cueing   LB Dressing Simulated donning/doffing pants with bag with occasional min v.c. for big movements   ADL Comments Pt instructed to perform at least one 1 position (supine, sitting, quadraped, standing) of PWR! moves and coordination HEP per day.  Also continued discussion regarding community fitness PWR! ex classes.   ADL Education Given Yes   Big Amplitude Movements Pt instructed in use of big amplitude movments for ADLs   Neurological Re-education Exercises   Finger Extension Pulling bag into palm with use of big  finger movements with min v.c. and improved performance with each hand with repetition.           PWR [facility]) - 02/07/15 1027    PWR! exercises Moves in [place]! Up x10   PWR! Rock x10   PWR! Twist x10 to each side   PWR! Step x10 to each side   Comments Quadraped with min v.c. for big movements              OT Education - 02/07/15 1026    Education Details Big movements for ADLs, PWR! Moves in [employer]) Educated Patient   Methods Explanation;Demonstration;Handout;Verbal cues   Comprehension Verbalized understanding;Returned demonstration;Verbal cues required          OT Short Term Goals - 02/07/15 1036    OT SHORT TERM GOAL #1   Title Pt will be independent with PD-specific HEP.--check STGs 02/08/15   Time 4   Period Weeks   Status On-going   OT SHORT TERM GOAL #2   Title Pt will be able to write at least 3 sentences with 100% legibility and no significant decrease in size.   Baseline 90%, min decr in size   Time 4   Period Weeks   Status New   OT SHORT TERM GOAL #3   Title Pt will improve coordination for ADLs as shown by improving time on 9-hole peg test by at least 8sec with RUE.   Baseline 60.44   Time 4   Period Weeks   Status New   OT SHORT TERM GOAL #4   Title Pt will improve ability to eat as shown by completing PPT#2 in 12sec or less   Baseline 15.01   Time 4   Period Weeks   Status New   OT SHORT TERM GOAL #5   Title Pt will improve ability to button as shown by ability to button/unbutton 3 buttons with shirt on tabletop in 70sec or less.   Baseline 83.03   Time 4   Period Weeks   Status New   OT SHORT TERM GOAL #6   Title Pt will verbalize understanding of ways to prevent future complications and appropriate PD-related community resources.   Time 4   Period Weeks   Status Achieved  02/07/15           OT Long Term Goals - 01/10/15 1707    OT LONG TERM GOAL #1   Title Pt will verbalize understanding of adaptive  strategies/AE to incr ease/efficiency with ADLs/IADLs prn.   Time 8   Period Weeks  Status New   OT LONG TERM GOAL #2   Title Pt will be able to carry glass of liquid at least 17feet without spills x2.   Time 8   Period Weeks   Status New   OT LONG TERM GOAL #3   Title Pt will improve coordination for ADLs as shown by improving time on 9-hole peg test by at least 15sec with RUE.   Baseline 60.44   Time 8   Period Weeks   Status New   OT LONG TERM GOAL #4   Title Pt will improve coordination/functional reaching for ADLs as shown by improving score on box and blocks test by at least 6 bilaterally.   Baseline R-31, L-37 blocks   Time 8   Period Weeks   Status New   OT LONG TERM GOAL #5   Title Pt will improve balance/functional reaching for ADLs as shown by improving standing functional reach test to at least 10" with LUE.   Time 8   Period Weeks   Status New   Long Term Additional Goals   Additional Long Term Goals Yes   OT LONG TERM GOAL #6   Title Pt will improve ability to button as shown by ability to button/unbutton 3 buttons with shirt on tabletop in 60sec or less.   Baseline 55sec   Time 8   Period Weeks   Status New               Plan - 02/07/15 1034    Clinical Impression Statement Pt is progressing towards goals and reports improved R hand functional use.  Pt reports incorporating big amplitude movement strategies into ADLs.  Pt continues to respond well to cues for big movements.   Plan review PWR! in quadraped, begin checking STGs, schedule more appts prn   OT Home Exercise Plan issued: seated PWR!; 01/31/15 PWR! Moves in supine, ways to prevent future complications, community resources; coordination HEP; 02/07/15 PWR! moves in [employer] and Agree with Plan of Care Patient        Problem List Patient Active Problem List   Diagnosis Date Noted  . HLD (hyperlipidemia)   . TIA (transient ischemic attack) 08/09/2014  . Chronic kidney  disease 08/09/2014  . Atrial fibrillation ([place]) 08/09/2014  . Right renal mass   . Atrial fibrillation with rapid ventricular response ([place]) 07/17/2014  . Elevated troponin 07/17/2014  . Diverticulosis of colon without hemorrhage 06/16/2013  . Post-operative hypothyroidism 06/16/2013  . Renal oncocytoma 06/09/2012  . LBP (low back pain) 06/09/2012  . Parkinson disease, symptomatic ([place]) 06/06/2011  . CARCINOMA, SKIN, SQUAMOUS CELL 12/10/2007  . MALIGNANT NEOPLASM OF THYROID GLAND 12/10/2007  . SUBDURAL HEMATOMA 12/10/2007  . BPH (benign prostatic hypertrophy) 12/10/2007  . Essential hypertension 04/22/2007  . Transient cerebral ischemia 04/22/2007  . Generalized atherosclerosis 04/22/2007  . Osteoarthritis 04/22/2007    [facility] 02/07/2015, 10:38 AM  [facility] [address] [place], [name], [address] Phone: [phone]   Fax:  [phone]  Name: William [name] MRN: [id number] Date of Birth: 1928/10/17  Vianne Bulls, OTR/L 02/07/2015 10:38 AM

## 2015-02-07 NOTE — Patient Instructions (Signed)
   Instead of loud Hey!, change to holding the [facility]! out for 3 seconds. Repeat 10 times, twice a day.

## 2015-02-07 NOTE — Therapy (Signed)
[facility] [address] [place], [name], [address] Phone: [phone]   Fax:  [phone]  Speech Language Pathology Treatment  Patient Details  Name: William [name] MRN: [id number] Date of Birth: 1928-09-14 No Data Recorded  Encounter Date: 02/07/2015      End of Session - 02/07/15 1203    Visit Number 4   Number of Visits 17   SLP Start Time 1017   SLP Stop Time  1100   SLP Time Calculation (min) 43 min   Activity Tolerance Patient tolerated treatment well      Past Medical History  Diagnosis Date  . Hyperlipidemia   . Hx of colonic polyps   . Benign prostatic hypertrophy with urinary obstruction   . TIA (transient ischemic attack)     pt does not recall this hx "but may have had one today" (08/09/2014)  . Subdural hematoma ([place]) 2006  . Gout   . Hypertension     sees [name]  . Thyroid disease     medullary thyroid   . Pneumonia     hx of in 1952  . Parkinson disease ([place])   . A-fib ([place])   . Complication of anesthesia     "difficulty intubation, had to use fiberoptic 2006"  . Difficult intubation   . 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 Primary skin squamous cell carcinoma   . Basal cell carcinoma   . Hypothyroidism   . DJD (degenerative joint disease)   . Arthritis     "right knee" (08/09/2014)  . Gout     "periodically" (08/09/2014)  . Kidney tumor     "tumor on cyst on kidney"   . DJD (degenerative joint disease)   . Parkinson disease [facility])     Past Surgical History  Procedure Laterality Date  . Cataract extraction w/ intraocular lens  implant, bilateral Bilateral ~ 1998  . Cholecystectomy    . Thyroidectomy      medullary carcinoma  . Eye surgery    . Knee arthroscopy Right   . Appendectomy    . Tonsillectomy    . Lumbar laminectomy/decompression microdiscectomy  05/14/2012    Procedure: LUMBAR LAMINECTOMY/DECOMPRESSION MICRODISCECTOMY 1  LEVEL;  Surgeon: [name], MD;  Location: [facility];  Service: Neurosurgery;  Laterality: Left;  Left Lumbar four-five Laminectomy/Diskectomy/Far lateral diskectomy  . Cryoablation  07-10-12    "tumor on cyst on my kidney; had an ablation"  . Skin cancer excision  "several"    "back of my neck; right eye; clavicle; nose"  . Inguinal hernia repair Right   . Back surgery    . Subdural hematoma evacuation via craniotomy  2006  . Kidney surgery      There were no vitals filed for this visit.  Visit Diagnosis: Dysarthria  Dysphagia             ADULT SLP TREATMENT - 02/07/15 1030    General Information   Behavior/Cognition Alert;Cooperative;Pleasant mood   Treatment Provided   Treatment provided Dysphagia;Cognitive-Linquistic   Dysphagia Treatment   Temperature Spikes Noted No   Other treatment/comments SLP reminded pt about his hydrophonic voice, consistently. Pt said, "Yes, you suggested I chew some gum, but I just forget to stop and buy it." SLP wrote pt reminder note.    Pain Assessment   Pain Assessment No/denies pain   Cognitive-Linquistic Treatment   Treatment focused on Dysarthria   Skilled  Treatment Prior to loud "hey!" reps, pt's loudness in 7 minutes simple conversation was 67dB. Loud "hey!" completed with excellent success. SLP had pt hold out loud "hey!" for 3 seconds with good to excellent success. In extending further to loud sustained /a/ pt's voice became hoarse and he was unable to sustain /a/ >4.5 seconds. Pt's loudness in simple 12 minute conversation was average 69dB after loud sustained "hey!" SLP provided homework for multisentence responses.   Assessment / Recommendations / Plan   Plan Continue with current plan of care   Progression Toward Goals   Progression toward goals Progressing toward goals          SLP Education - 02/07/15 1203    Education provided Yes   Education Details loud, sustained "hey!" for 3 seconds   Person(s) Educated  Patient   Methods Explanation;Demonstration   Comprehension Verbalized understanding;Returned demonstration          SLP Short Term Goals - 02/07/15 1205    SLP SHORT TERM GOAL #1   Title pt to maintain loud /a/ average 82dB over 3 sessions   Time 3   Period Weeks   Status On-going   SLP SHORT TERM GOAL #2   Title pt will maintain volume of average 70dB in 12 minutes simple/mod complex conversation over two sessions   Time 3   Period Weeks   Status On-going   SLP SHORT TERM GOAL #3   Title (if necessary) pt will complete swallowing HEP following possible modified barium swallow exam with rare min A   Time 3   Period Weeks   Status On-going          SLP Long Term Goals - 02/07/15 1205    SLP LONG TERM GOAL #1   Title pt will maintain loud /a/ average 83dB over 4 sessions   Time 7   Period Weeks   Status On-going   SLP LONG TERM GOAL #2   Title pt will engage in mod complex conversation of 10 minutes with average 71dB with rare min A   Time 7   Status On-going   SLP LONG TERM GOAL #3   Title (if necessary) pt will complete swallowing HEP with modifed independence   Time 7   Period Weeks   Status On-going   SLP LONG TERM GOAL #4   Title pt will tell SLP 3 s/s aspiration PNA with modified independence   Time 7   Period Weeks   Status On-going          Plan - 02/07/15 1204    Clinical Impression Statement Need to cont to see pt to assess cont success iwth HEP for swallowing, as well. as carryover louder, WNL speech to speaking situations outside of ST.   Speech Therapy Frequency 2x / week   Duration --  6 weeks   Treatment/Interventions Aspiration precaution training;Pharyngeal strengthening exercises;Oral motor exercises;SLP instruction and feedback;Internal/external aids;Compensatory strategies;Patient/family education;Functional tasks;Cueing hierarchy   Potential to Achieve Goals Good        Problem List Patient Active Problem List   Diagnosis Date  Noted  . HLD (hyperlipidemia)   . TIA (transient ischemic attack) 08/09/2014  . Chronic kidney disease 08/09/2014  . Atrial fibrillation ([place]) 08/09/2014  . Right renal mass   . Atrial fibrillation with rapid ventricular response ([place]) 07/17/2014  . Elevated troponin 07/17/2014  . Diverticulosis of colon without hemorrhage 06/16/2013  . Post-operative hypothyroidism 06/16/2013  . Renal oncocytoma 06/09/2012  . LBP (low back pain)  06/09/2012  . Parkinson disease, symptomatic ([place]) 06/06/2011  . CARCINOMA, SKIN, SQUAMOUS CELL 12/10/2007  . MALIGNANT NEOPLASM OF THYROID GLAND 12/10/2007  . SUBDURAL HEMATOMA 12/10/2007  . BPH (benign prostatic hypertrophy) 12/10/2007  . Essential hypertension 04/22/2007  . Transient cerebral ischemia 04/22/2007  . Generalized atherosclerosis 04/22/2007  . Osteoarthritis 04/22/2007    [facility] , [place], CCC-SLP  02/07/2015, 12:06 PM  [facility] [address] [place], [name], [address] Phone: [phone]   Fax:  [phone]   Name: William [name] MRN: [id number] Date of Birth: [birth year]

## 2015-02-07 NOTE — Therapy (Signed)
[facility] [address] [place], [name], [address] Phone: [phone]   Fax:  [phone]  Physical Therapy Treatment  Patient Details  Name: William [name] MRN: [id number] Date of Birth: 1928-10-21 No Data Recorded  Encounter Date: 02/07/2015      PT End of Session - 02/07/15 1302    Visit Number 6   Number of Visits 13   Date for PT Re-Evaluation 03/11/15   Authorization Type [employer] HMO-G-code every 10th visit   PT Start Time 1105   PT Stop Time 1143   PT Time Calculation (min) 38 min   Activity Tolerance Patient tolerated treatment well   Behavior During Therapy [facility] for tasks assessed/performed      Past Medical History  Diagnosis Date  . Hyperlipidemia   . Hx of colonic polyps   . Benign prostatic hypertrophy with urinary obstruction   . TIA (transient ischemic attack)     pt does not recall this hx "but may have had one today" (08/09/2014)  . Subdural hematoma ([place]) 2006  . Gout   . Hypertension     sees [name]  . Thyroid disease     medullary thyroid   . Pneumonia     hx of in 1952  . Parkinson disease ([place])   . A-fib ([place])   . Complication of anesthesia     "difficulty intubation, had to use fiberoptic 2006"  . Difficult intubation   . 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 Primary skin squamous cell carcinoma   . Basal cell carcinoma   . Hypothyroidism   . DJD (degenerative joint disease)   . Arthritis     "right knee" (08/09/2014)  . Gout     "periodically" (08/09/2014)  . Kidney tumor     "tumor on cyst on kidney"   . DJD (degenerative joint disease)   . Parkinson disease [facility])     Past Surgical History  Procedure Laterality Date  . Cataract extraction w/ intraocular lens  implant, bilateral Bilateral ~ 1998  . Cholecystectomy    . Thyroidectomy      medullary carcinoma  . Eye surgery    . Knee arthroscopy Right   . Appendectomy    .  Tonsillectomy    . Lumbar laminectomy/decompression microdiscectomy  05/14/2012    Procedure: LUMBAR LAMINECTOMY/DECOMPRESSION MICRODISCECTOMY 1 LEVEL;  Surgeon: [name], MD;  Location: [facility];  Service: Neurosurgery;  Laterality: Left;  Left Lumbar four-five Laminectomy/Diskectomy/Far lateral diskectomy  . Cryoablation  07-10-12    "tumor on cyst on my kidney; had an ablation"  . Skin cancer excision  "several"    "back of my neck; right eye; clavicle; nose"  . Inguinal hernia repair Right   . Back surgery    . Subdural hematoma evacuation via craniotomy  2006  . Kidney surgery      There were no vitals filed for this visit.  Visit Diagnosis:  Lack of coordination  Bradykinesia  Abnormality of gait  Unsteadiness      Subjective Assessment - 02/07/15 1107    Subjective Pt denied falls or changes since last session.    Patient Stated Goals Pt's goal for therapy is to work on balance and stamina.   Currently in Pain? No/denies                    Neuro re-ed:     [facility] Adult PT  Treatment/Exercise - 02/07/15 1109    Transfers   Five time sit to stand comments  13.3 seconds, no LOB episodes.   Standardized Balance Assessment   Standardized Balance Assessment Timed Up and Go Test   Timed Up and Go Test   TUG Cognitive TUG   Cognitive TUG (seconds) 15.2  no AD and 15.6 sec.       Pt performed/reviewed PWR! Moves HEP in standing and in supine positions, 10 reps in each position (4 per position). VC's, demonstration, and manual cues for technique.       PT Education - 02/07/15 1255    Education provided Yes   Education Details Reviewed PWR! supine and standing HEP and PT reiterated the importance of performing HEP as prescribed, as pt reported he had performed HEP only once.   Person(s) Educated Patient   Methods Explanation;Demonstration;Verbal cues;Handout   Comprehension Verbalized understanding;Returned demonstration          PT Short  Term Goals - 02/07/15 1307    PT SHORT TERM GOAL #1   Title Pt will perform HEP with supervision for improved functional mobility, transfers, balance and gait.  TARGET 02/09/15   Time 4   Period Weeks   Status Partially Met   PT SHORT TERM GOAL #2   Title Pt will improve 5x sit<>stand to less than or equal to 15 seconds for improved efficiency and safety with transfers.   Time 4   Period Weeks   Status Achieved   PT SHORT TERM GOAL #3   Title Pt will improve TUG cognitive to less than or equal to 15 seconds for decreased fall risk/improved dual tasking with gait.   Time 4   Period Weeks   Status Partially Met   PT SHORT TERM GOAL #4   Title Pt will verbalize understanding of local Parkinson's resources.   Time 4   Period Weeks   Status Partially Met           PT Long Term Goals - 02/07/15 1309    PT LONG TERM GOAL #1   Title Pt will verbalize understanding of fall prevention within the home environment.  TARGET 03/11/15   Time 6   Period Weeks   Status On-going   PT LONG TERM GOAL #2   Title Pt will perform at least 8 of 10 reps of sit<>stand transfers from 18 inch surfaces or lower, with minimal to no UE support, for improved safety and efficiency with transfers.   Time 6   Period Weeks   Status On-going   PT LONG TERM GOAL #3   Title Pt will improve Functional Gait Assessment to at least 19/30 for decreased fall risk.   Time 6   Period Weeks   Status On-going   PT LONG TERM GOAL #4   Title Pt will verbalize plans for continued community fitness upon D/C from PT.   Time 6   Period Weeks   Status On-going               Plan - 02/07/15 1303    Clinical Impression Statement Pt demonstrated progress as he partially met STGs 1 and 3, met STGs 2 and 4. Pt required cues to improve hip/trunk rotation during HEP and responded quickly to cues with proper technique. PWR! quadraped not performed as he reported he performed during OT session. Pt would continue to  benefit from skilled PT to improve safety during functional mobility.   Pt will benefit from skilled therapeutic intervention  in order to improve on the following deficits Abnormal gait;Decreased balance;Decreased mobility;Decreased strength;Difficulty walking;Postural dysfunction   Rehab Potential Good   PT Frequency 2x / week   PT Duration 6 weeks  plus eval   PT Treatment/Interventions ADLs/Self Care Home Management;Therapeutic activities;Therapeutic exercise;Functional mobility training;Gait training;Balance training;Neuromuscular re-education;Patient/family education   PT Next Visit Plan Gait for intensity and posture;Modified quadruped PWR!; continue balance activities on compliant surfaces   PT Home Exercise Plan PWR! HEP   Consulted and Agree with Plan of Care Patient        Problem List Patient Active Problem List   Diagnosis Date Noted  . HLD (hyperlipidemia)   . TIA (transient ischemic attack) 08/09/2014  . Chronic kidney disease 08/09/2014  . Atrial fibrillation ([place]) 08/09/2014  . Right renal mass   . Atrial fibrillation with rapid ventricular response ([place]) 07/17/2014  . Elevated troponin 07/17/2014  . Diverticulosis of colon without hemorrhage 06/16/2013  . Post-operative hypothyroidism 06/16/2013  . Renal oncocytoma 06/09/2012  . LBP (low back pain) 06/09/2012  . Parkinson disease, symptomatic ([place]) 06/06/2011  . CARCINOMA, SKIN, SQUAMOUS CELL 12/10/2007  . MALIGNANT NEOPLASM OF THYROID GLAND 12/10/2007  . SUBDURAL HEMATOMA 12/10/2007  . BPH (benign prostatic hypertrophy) 12/10/2007  . Essential hypertension 04/22/2007  . Transient cerebral ischemia 04/22/2007  . Generalized atherosclerosis 04/22/2007  . Osteoarthritis 04/22/2007    Shadow Schedler L 02/07/2015, 1:12 PM  [facility] [address] [place], [name], [address] Phone: [phone]   Fax:  [phone]  Name: CHARLOTTE BRAFFORD MRN:  [id number] Date of Birth: 10-Jan-1929   Geoffry Paradise, PT,DPT 02/07/2015 1:12 PM Phone: [phone] Fax: [phone]

## 2015-02-07 NOTE — Patient Instructions (Signed)

## 2015-02-09 ENCOUNTER — Ambulatory Visit: Payer: Commercial Managed Care - HMO | Admitting: Speech Pathology

## 2015-02-09 ENCOUNTER — Ambulatory Visit: Payer: Commercial Managed Care - HMO | Admitting: Occupational Therapy

## 2015-02-09 ENCOUNTER — Ambulatory Visit: Payer: Commercial Managed Care - HMO | Admitting: Physical Therapy

## 2015-02-09 DIAGNOSIS — R269 Unspecified abnormalities of gait and mobility: Secondary | ICD-10-CM

## 2015-02-09 DIAGNOSIS — R471 Dysarthria and anarthria: Secondary | ICD-10-CM | POA: Diagnosis not present

## 2015-02-09 DIAGNOSIS — R258 Other abnormal involuntary movements: Secondary | ICD-10-CM | POA: Diagnosis not present

## 2015-02-09 DIAGNOSIS — R29898 Other symptoms and signs involving the musculoskeletal system: Secondary | ICD-10-CM

## 2015-02-09 DIAGNOSIS — R1310 Dysphagia, unspecified: Secondary | ICD-10-CM

## 2015-02-09 DIAGNOSIS — R279 Unspecified lack of coordination: Secondary | ICD-10-CM

## 2015-02-09 DIAGNOSIS — R2681 Unsteadiness on feet: Secondary | ICD-10-CM | POA: Diagnosis not present

## 2015-02-09 NOTE — Therapy (Signed)
[facility] [address] [place], [name], [address] Phone: [phone]   Fax:  [phone]  Speech Language Pathology Treatment  Patient Details  Name: William [name] MRN: [id number] Date of Birth: [birth year] No Data Recorded  Encounter Date: 02/09/2015      End of Session - 02/09/15 1145    Visit Number 5   Number of Visits 88   SLP Start Time 4656   SLP Stop Time  1146   SLP Time Calculation (min) 44 min   Activity Tolerance Patient tolerated treatment well      Past Medical History  Diagnosis Date  . Hyperlipidemia   . Hx of colonic polyps   . Benign prostatic hypertrophy with urinary obstruction   . TIA (transient ischemic attack)     pt does not recall this hx "but may have had one today" (08/09/2014)  . Subdural hematoma ([place]) 2006  . Gout   . Hypertension     sees [name]  . Thyroid disease     medullary thyroid   . Pneumonia     hx of in 1952  . Parkinson disease ([place])   . A-fib ([place])   . Complication of anesthesia     "difficulty intubation, had to use fiberoptic 2006"  . Difficult intubation   . 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 Primary skin squamous cell carcinoma   . Basal cell carcinoma   . Hypothyroidism   . DJD (degenerative joint disease)   . Arthritis     "right knee" (08/09/2014)  . Gout     "periodically" (08/09/2014)  . Kidney tumor     "tumor on cyst on kidney"   . DJD (degenerative joint disease)   . Parkinson disease [facility])     Past Surgical History  Procedure Laterality Date  . Cataract extraction w/ intraocular lens  implant, bilateral Bilateral ~ 1998  . Cholecystectomy    . Thyroidectomy      medullary carcinoma  . Eye surgery    . Knee arthroscopy Right   . Appendectomy    . Tonsillectomy    . Lumbar laminectomy/decompression microdiscectomy  05/14/2012    Procedure: LUMBAR LAMINECTOMY/DECOMPRESSION MICRODISCECTOMY 1  LEVEL;  Surgeon: [name], MD;  Location: [facility];  Service: Neurosurgery;  Laterality: Left;  Left Lumbar four-five Laminectomy/Diskectomy/Far lateral diskectomy  . Cryoablation  07-10-12    "tumor on cyst on my kidney; had an ablation"  . Skin cancer excision  "several"    "back of my neck; right eye; clavicle; nose"  . Inguinal hernia repair Right   . Back surgery    . Subdural hematoma evacuation via craniotomy  2006  . Kidney surgery      There were no vitals filed for this visit.  Visit Diagnosis: Dysarthria  Dysphagia      Subjective Assessment - 02/09/15 1107    Subjective "[name] says she can hear me better now"               ADULT SLP TREATMENT - 02/09/15 1107    General Information   Behavior/Cognition Alert;Cooperative;Pleasant mood   Treatment Provided   Treatment provided Dysphagia;Cognitive-Linquistic   Dysphagia Treatment   Temperature Spikes Noted No   Other treatment/comments Pt verbalized swallowing precautions with occasional min questioning cues. Pt. Performed dysphagia HEP with rare min A - pt had success with [place] today with occasoinal min A.  Pain Assessment   Pain Assessment No/denies pain   Cognitive-Linquistic Treatment   Treatment focused on Dysarthria   Skilled Treatment Loud "hey!" prolonged for 3 seconds with cues average 84dB average. Oral reading average 70dB with cues to think loud.  Structured speech tasks required usual min to mod verbal, and visual cues and written sign to use adequate volume (average 70 dB ).   Assessment / Recommendations / Plan   Plan Continue with current plan of care   Progression Toward Goals   Progression toward goals Progressing toward goals          SLP Education - 02/09/15 1143    Education provided No          SLP Short Term Goals - 02/09/15 1145    SLP SHORT TERM GOAL #1   Title pt to maintain loud /a/ average 82dB over 3 sessions   Time 3   Period Weeks   Status On-going    SLP SHORT TERM GOAL #2   Title pt will maintain volume of average 70dB in 12 minutes simple/mod complex conversation over two sessions   Time 3   Period Weeks   Status On-going   SLP SHORT TERM GOAL #3   Title (if necessary) pt will complete swallowing HEP following possible modified barium swallow exam with rare min A   Time 3   Period Weeks   Status On-going          SLP Long Term Goals - 02/09/15 1145    SLP LONG TERM GOAL #1   Title pt will maintain loud /a/ average 83dB over 4 sessions   Time 7   Period Weeks   Status On-going   SLP LONG TERM GOAL #2   Title pt will engage in mod complex conversation of 10 minutes with average 71dB with rare min A   Time 7   Status On-going   SLP LONG TERM GOAL #3   Title (if necessary) pt will complete swallowing HEP with modifed independence   Time 7   Period Weeks   Status On-going   SLP LONG TERM GOAL #4   Title pt will tell SLP 3 s/s aspiration PNA with modified independence   Time 7   Period Weeks   Status On-going          Plan - 02/09/15 1144    Clinical Impression Statement HEP for dysphagia with min A, structured speech and simple conversation 69dB with min A.   Speech Therapy Frequency 2x / week   Treatment/Interventions Aspiration precaution training;Pharyngeal strengthening exercises;Oral motor exercises;SLP instruction and feedback;Internal/external aids;Compensatory strategies;Patient/family education;Functional tasks;Cueing hierarchy        Problem List Patient Active Problem List   Diagnosis Date Noted  . HLD (hyperlipidemia)   . TIA (transient ischemic attack) 08/09/2014  . Chronic kidney disease 08/09/2014  . Atrial fibrillation ([place]) 08/09/2014  . Right renal mass   . Atrial fibrillation with rapid ventricular response ([place]) 07/17/2014  . Elevated troponin 07/17/2014  . Diverticulosis of colon without hemorrhage 06/16/2013  . Post-operative hypothyroidism 06/16/2013  . Renal oncocytoma  06/09/2012  . LBP (low back pain) 06/09/2012  . Parkinson disease, symptomatic ([place]) 06/06/2011  . CARCINOMA, SKIN, SQUAMOUS CELL 12/10/2007  . MALIGNANT NEOPLASM OF THYROID GLAND 12/10/2007  . SUBDURAL HEMATOMA 12/10/2007  . BPH (benign prostatic hypertrophy) 12/10/2007  . Essential hypertension 04/22/2007  . Transient cerebral ischemia 04/22/2007  . Generalized atherosclerosis 04/22/2007  . Osteoarthritis 04/22/2007    Lovvorn, Annye Rusk  MS, CCC-SLP 02/09/2015, 11:47 AM  [facility] [address] [place], [name], [address] Phone: [phone]   Fax:  [phone]   Name: GENARO BEKKER MRN: [id number] Date of Birth: 1928-07-30

## 2015-02-09 NOTE — Therapy (Signed)
[facility] [address] [place], [name], [address] Phone: [phone]   Fax:  [phone]  Physical Therapy Treatment  Patient Details  Name: William [name] MRN: [id number] Date of Birth: [birth year] No Data Recorded  Encounter Date: 02/09/2015      PT End of Session - 02/09/15 1153    Visit Number 7   Number of Visits 13   Date for PT Re-Evaluation 03/11/15   Authorization Type [employer] HMO-G-code every 10th visit   PT Start Time 1018   PT Stop Time 1100   PT Time Calculation (min) 42 min   Activity Tolerance Patient tolerated treatment well   Behavior During Therapy [facility] for tasks assessed/performed      Past Medical History  Diagnosis Date  . Hyperlipidemia   . Hx of colonic polyps   . Benign prostatic hypertrophy with urinary obstruction   . TIA (transient ischemic attack)     pt does not recall this hx "but may have had one today" (08/09/2014)  . Subdural hematoma ([place]) 2006  . Gout   . Hypertension     sees [name]  . Thyroid disease     medullary thyroid   . Pneumonia     hx of in 1952  . Parkinson disease ([place])   . A-fib ([place])   . Complication of anesthesia     "difficulty intubation, had to use fiberoptic 2006"  . Difficult intubation   . 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 Primary skin squamous cell carcinoma   . Basal cell carcinoma   . Hypothyroidism   . DJD (degenerative joint disease)   . Arthritis     "right knee" (08/09/2014)  . Gout     "periodically" (08/09/2014)  . Kidney tumor     "tumor on cyst on kidney"   . DJD (degenerative joint disease)   . Parkinson disease [facility])     Past Surgical History  Procedure Laterality Date  . Cataract extraction w/ intraocular lens  implant, bilateral Bilateral ~ 1998  . Cholecystectomy    . Thyroidectomy      medullary carcinoma  . Eye surgery    . Knee arthroscopy Right   . Appendectomy    .  Tonsillectomy    . Lumbar laminectomy/decompression microdiscectomy  05/14/2012    Procedure: LUMBAR LAMINECTOMY/DECOMPRESSION MICRODISCECTOMY 1 LEVEL;  Surgeon: [name], MD;  Location: [facility];  Service: Neurosurgery;  Laterality: Left;  Left Lumbar four-five Laminectomy/Diskectomy/Far lateral diskectomy  . Cryoablation  07-10-12    "tumor on cyst on my kidney; had an ablation"  . Skin cancer excision  "several"    "back of my neck; right eye; clavicle; nose"  . Inguinal hernia repair Right   . Back surgery    . Subdural hematoma evacuation via craniotomy  2006  . Kidney surgery      There were no vitals filed for this visit.  Visit Diagnosis:  Bradykinesia  Rigidity  Abnormality of gait      Subjective Assessment - 02/09/15 1019    Subjective Pt reports no changes, no falls since last session.   Currently in Pain? No/denies                         [facility] Adult PT Treatment/Exercise - 02/09/15 1022    Ambulation/Gait   Ambulation/Gait Yes   Ambulation/Gait Assistance 5: Supervision   Ambulation/Gait  Assistance Details Gait indoors with environmental scanning around gym area; cues provided for wider BOS and increased intensity of gait, improved arm swing and foot clearance   Ambulation Distance (Feet) 600 Feet   Assistive device None   Gait Pattern Step-through pattern;Decreased dorsiflexion - right;Decreased dorsiflexion - left;Poor foot clearance - right;Narrow base of support  Pt slows with environmental scanning tasks   Ambulation Surface Level;Indoor   High Level Balance   High Level Balance Comments In parallel bars on rockerboard:  hip/ankle strategy work, then head turns/nods with eyes open, then eyes closed x 10 seconds with intermittent UE support and min guard assistance.  Marching in place, forward kicks, head turns/head nods with eyes open, then eyes closed x 10 seconds also performed on compliant balance beam surface as well as  incline/decline.  Pt performs sidestepping up and down ramp, 3 reps each leg leading with supervision, all performed for improved dynamic balance.  Four square step activity, multiple reps with min guard assistance, for improved direction changes.  Pt has several episodes of loss of balance upon stance position.                    PT Short Term Goals - 02/07/15 1307    PT SHORT TERM GOAL #1   Title Pt will perform HEP with supervision for improved functional mobility, transfers, balance and gait.  TARGET 02/09/15   Time 4   Period Weeks   Status Partially Met   PT SHORT TERM GOAL #2   Title Pt will improve 5x sit<>stand to less than or equal to 15 seconds for improved efficiency and safety with transfers.   Time 4   Period Weeks   Status Achieved   PT SHORT TERM GOAL #3   Title Pt will improve TUG cognitive to less than or equal to 15 seconds for decreased fall risk/improved dual tasking with gait.   Time 4   Period Weeks   Status Partially Met   PT SHORT TERM GOAL #4   Title Pt will verbalize understanding of local Parkinson's resources.   Time 4   Period Weeks   Status Partially Met           PT Long Term Goals - 02/07/15 1309    PT LONG TERM GOAL #1   Title Pt will verbalize understanding of fall prevention within the home environment.  TARGET 03/11/15   Time 6   Period Weeks   Status On-going   PT LONG TERM GOAL #2   Title Pt will perform at least 8 of 10 reps of sit<>stand transfers from 18 inch surfaces or lower, with minimal to no UE support, for improved safety and efficiency with transfers.   Time 6   Period Weeks   Status On-going   PT LONG TERM GOAL #3   Title Pt will improve Functional Gait Assessment to at least 19/30 for decreased fall risk.   Time 6   Period Weeks   Status On-going   PT LONG TERM GOAL #4   Title Pt will verbalize plans for continued community fitness upon D/C from PT.   Time 6   Period Weeks   Status On-going                Plan - 02/09/15 1154    Clinical Impression Statement Focus of today's session on varied compliant surface activities for improved balance.  Pt has increased difficulty with balance tasks with eyes closed; he also has difficulty  with quick stop balance on 4-square step activity.  Pt will continue to benefit from further skilled PT to address balance, gait activities.   Pt will benefit from skilled therapeutic intervention in order to improve on the following deficits Abnormal gait;Decreased balance;Decreased mobility;Decreased strength;Difficulty walking;Postural dysfunction   Rehab Potential Good   PT Frequency 2x / week   PT Duration 6 weeks  wk 4 of 6   PT Treatment/Interventions ADLs/Self Care Home Management;Therapeutic activities;Therapeutic exercise;Functional mobility training;Gait training;Balance training;Neuromuscular re-education;Patient/family education   PT Next Visit Plan Gait for intensity and posture on varied surfaces and with environmental scanning, continue balance activities on compliant surfaces, transfers   PT Home Exercise Plan PWR! HEP   Consulted and Agree with Plan of Care Patient        Problem List Patient Active Problem List   Diagnosis Date Noted  . HLD (hyperlipidemia)   . TIA (transient ischemic attack) 08/09/2014  . Chronic kidney disease 08/09/2014  . Atrial fibrillation ([place]) 08/09/2014  . Right renal mass   . Atrial fibrillation with rapid ventricular response ([place]) 07/17/2014  . Elevated troponin 07/17/2014  . Diverticulosis of colon without hemorrhage 06/16/2013  . Post-operative hypothyroidism 06/16/2013  . Renal oncocytoma 06/09/2012  . LBP (low back pain) 06/09/2012  . Parkinson disease, symptomatic ([place]) 06/06/2011  . CARCINOMA, SKIN, SQUAMOUS CELL 12/10/2007  . MALIGNANT NEOPLASM OF THYROID GLAND 12/10/2007  . SUBDURAL HEMATOMA 12/10/2007  . BPH (benign prostatic hypertrophy) 12/10/2007  . Essential hypertension  04/22/2007  . Transient cerebral ischemia 04/22/2007  . Generalized atherosclerosis 04/22/2007  . Osteoarthritis 04/22/2007    Melani Brisbane W. 02/09/2015, 12:00 PM  Frazier Butt., PT  [facility] [address] [place], [name], [address] Phone: [phone]   Fax:  [phone]  Name: VICTORHUGO PREIS MRN: [id number] Date of Birth: 12-Dec-1928

## 2015-02-09 NOTE — Therapy (Signed)
[facility] [address] [place], [name], [address] Phone: [phone]   Fax:  [phone]  Occupational Therapy Treatment  Patient Details  Name: William [name] MRN: [id number] Date of Birth: 1929/01/28 No Data Recorded  Encounter Date: 02/09/2015      OT End of Session - 02/09/15 1227    Visit Number 6   Number of Visits 17   Date for OT Re-Evaluation 03/11/15   Authorization Type [employer] HMO, no visit limit, no auth required   Authorization - Visit Number 6   Authorization - Number of Visits 10   OT Start Time [phone]   OT Stop Time 1015   OT Time Calculation (min) 39 min      Past Medical History  Diagnosis Date  . Hyperlipidemia   . Hx of colonic polyps   . Benign prostatic hypertrophy with urinary obstruction   . TIA (transient ischemic attack)     pt does not recall this hx "but may have had one today" (08/09/2014)  . Subdural hematoma ([place]) 2006  . Gout   . Hypertension     sees [name]  . Thyroid disease     medullary thyroid   . Pneumonia     hx of in 1952  . Parkinson disease ([place])   . A-fib ([place])   . Complication of anesthesia     "difficulty intubation, had to use fiberoptic 2006"  . Difficult intubation   . 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 Primary skin squamous cell carcinoma   . Basal cell carcinoma   . Hypothyroidism   . DJD (degenerative joint disease)   . Arthritis     "right knee" (08/09/2014)  . Gout     "periodically" (08/09/2014)  . Kidney tumor     "tumor on cyst on kidney"   . DJD (degenerative joint disease)   . Parkinson disease [facility])     Past Surgical History  Procedure Laterality Date  . Cataract extraction w/ intraocular lens  implant, bilateral Bilateral ~ 1998  . Cholecystectomy    . Thyroidectomy      medullary carcinoma  . Eye surgery    . Knee arthroscopy Right   . Appendectomy    . Tonsillectomy    . Lumbar  laminectomy/decompression microdiscectomy  05/14/2012    Procedure: LUMBAR LAMINECTOMY/DECOMPRESSION MICRODISCECTOMY 1 LEVEL;  Surgeon: [name], MD;  Location: [facility];  Service: Neurosurgery;  Laterality: Left;  Left Lumbar four-five Laminectomy/Diskectomy/Far lateral diskectomy  . Cryoablation  07-10-12    "tumor on cyst on my kidney; had an ablation"  . Skin cancer excision  "several"    "back of my neck; right eye; clavicle; nose"  . Inguinal hernia repair Right   . Back surgery    . Subdural hematoma evacuation via craniotomy  2006  . Kidney surgery      There were no vitals filed for this visit.  Visit Diagnosis:  Lack of coordination  Bradykinesia  Rigidity  Abnormality of gait      Subjective Assessment - 02/09/15 0944    Pertinent History PD (dx 2009), 07/2014 a-fib, TIA, hx of R craniotomy (11/2004), TIA (2003), thyroid CA, partial onset seizures   Patient Stated Goals improve ADLs and R hand use   Currently in Pain? No/denies          Treatment: PWR! Basic 4 quadraped(modified quadraped behind chair for PWR! Step), 10 reps  each, min v.c. initally then pt returned demonstration.  Arm bike x 6 mins level 1, pt was able to maintain 40RPM Self care: therapist started checking progress towards goals.  Handwriting activities with foam grip on pen. Pt requires v.c. for larger letter size and spacing. Pt can benefit from reinforcement. Fastening buttons with larger technique, mod difficulty and increased time required. PWR! Hands prior to task                       OT Short Term Goals - 02/09/15 0957    OT SHORT TERM GOAL #1   Title Pt will be independent with PD-specific HEP.--check STGs 02/08/15   Time 4   Period Weeks   Status Achieved   OT SHORT TERM GOAL #2   Title Pt will be able to write at least 3 sentences with 100% legibility and no significant decrease in size.   Baseline 90%, min decr in size   Time 4   Period Weeks   Status  On-going   OT SHORT TERM GOAL #3   Title Pt will improve coordination for ADLs as shown by improving time on 9-hole peg test by at least 8sec with RUE.   Baseline 60.44(eval)   Time 4   Period Weeks   Status New   OT SHORT TERM GOAL #4   Title Pt will improve ability to eat as shown by completing PPT#2 in 12sec or less   Baseline 16.75 secs on 02/09/15   Time 4   Period Weeks   Status On-going   OT SHORT TERM GOAL #5   Title Pt will improve ability to button as shown by ability to button/unbutton 3 buttons with shirt on tabletop in 70sec or less.   Baseline 83.03 checked 02/09/15   Time 4   Period Weeks   Status On-going   OT SHORT TERM GOAL #6   Title Pt will verbalize understanding of ways to prevent future complications and appropriate PD-related community resources.   Time 4   Period Weeks   Status Achieved  02/07/15           OT Long Term Goals - 01/10/15 1707    OT LONG TERM GOAL #1   Title Pt will verbalize understanding of adaptive strategies/AE to incr ease/efficiency with ADLs/IADLs prn.   Time 8   Period Weeks   Status New   OT LONG TERM GOAL #2   Title Pt will be able to carry glass of liquid at least 50feet without spills x2.   Time 8   Period Weeks   Status New   OT LONG TERM GOAL #3   Title Pt will improve coordination for ADLs as shown by improving time on 9-hole peg test by at least 15sec with RUE.   Baseline 60.44   Time 8   Period Weeks   Status New   OT LONG TERM GOAL #4   Title Pt will improve coordination/functional reaching for ADLs as shown by improving score on box and blocks test by at least 6 bilaterally.   Baseline R-31, L-37 blocks   Time 8   Period Weeks   Status New   OT LONG TERM GOAL #5   Title Pt will improve balance/functional reaching for ADLs as shown by improving standing functional reach test to at least 10" with LUE.   Time 8   Period Weeks   Status New   Long Term Additional Goals   Additional Long Term  Goals Yes    OT LONG TERM GOAL #6   Title Pt will improve ability to button as shown by ability to button/unbutton 3 buttons with shirt on tabletop in 60sec or less.   Baseline 55sec   Time 8   Period Weeks   Status New               Plan - 02/09/15 0949    Clinical Impression Statement Pt is progressing towards goals . Pt demonstrates understanding of PWR exercises in quadraped.   Pt will benefit from skilled therapeutic intervention in order to improve on the following deficits (Retired) Decreased mobility;Impaired UE functional use;Decreased knowledge of use of DME;Decreased balance;Decreased activity tolerance;Impaired tone;Decreased coordination   Rehab Potential Good   OT Frequency 2x / week   OT Duration 8 weeks   OT Treatment/Interventions Self-care/ADL training;Cryotherapy;Moist Heat;Passive range of motion;Fluidtherapy;Therapeutic activities;DME and/or AE instruction;Therapeutic exercises;Splinting;Manual Therapy;Neuromuscular education;Ultrasound;Therapeutic exercise;Functional Mobility Training;Patient/family education   Plan finish checking STG's, reinforce strategies for handwriting and fastening buttons   OT Home Exercise Plan issued: seated PWR!; 01/31/15 PWR! Moves in supine, ways to prevent future complications, community resources; coordination HEP; 02/07/15 PWR! moves in [employer] and Agree with Plan of Care Patient        Problem List Patient Active Problem List   Diagnosis Date Noted  . HLD (hyperlipidemia)   . TIA (transient ischemic attack) 08/09/2014  . Chronic kidney disease 08/09/2014  . Atrial fibrillation ([place]) 08/09/2014  . Right renal mass   . Atrial fibrillation with rapid ventricular response ([place]) 07/17/2014  . Elevated troponin 07/17/2014  . Diverticulosis of colon without hemorrhage 06/16/2013  . Post-operative hypothyroidism 06/16/2013  . Renal oncocytoma 06/09/2012  . LBP (low back pain) 06/09/2012  . Parkinson disease, symptomatic  ([place]) 06/06/2011  . CARCINOMA, SKIN, SQUAMOUS CELL 12/10/2007  . MALIGNANT NEOPLASM OF THYROID GLAND 12/10/2007  . SUBDURAL HEMATOMA 12/10/2007  . BPH (benign prostatic hypertrophy) 12/10/2007  . Essential hypertension 04/22/2007  . Transient cerebral ischemia 04/22/2007  . Generalized atherosclerosis 04/22/2007  . Osteoarthritis 04/22/2007    RINE,KATHRYN 02/09/2015, 12:37 PM Theone Murdoch, OTR/L [phone] Phone: [phone] 12:37 PM 02/09/2015 [facility] [address] [place], [name], [address] Phone: [phone]   Fax:  [phone]  Name: William [name] MRN: [id number] Date of Birth: May 31, 1928

## 2015-02-09 NOTE — Patient Instructions (Signed)
Continue  Loud hey  Prolonged, oral reading

## 2015-02-14 ENCOUNTER — Ambulatory Visit: Payer: Commercial Managed Care - HMO | Admitting: Physical Therapy

## 2015-02-14 ENCOUNTER — Ambulatory Visit: Payer: Commercial Managed Care - HMO | Admitting: Occupational Therapy

## 2015-02-14 ENCOUNTER — Ambulatory Visit: Payer: Commercial Managed Care - HMO | Attending: Pulmonary Disease

## 2015-02-14 DIAGNOSIS — R471 Dysarthria and anarthria: Secondary | ICD-10-CM | POA: Diagnosis not present

## 2015-02-14 DIAGNOSIS — R29898 Other symptoms and signs involving the musculoskeletal system: Secondary | ICD-10-CM | POA: Insufficient documentation

## 2015-02-14 DIAGNOSIS — R279 Unspecified lack of coordination: Secondary | ICD-10-CM

## 2015-02-14 DIAGNOSIS — R269 Unspecified abnormalities of gait and mobility: Secondary | ICD-10-CM | POA: Diagnosis not present

## 2015-02-14 DIAGNOSIS — R258 Other abnormal involuntary movements: Secondary | ICD-10-CM

## 2015-02-14 DIAGNOSIS — R1310 Dysphagia, unspecified: Secondary | ICD-10-CM

## 2015-02-14 DIAGNOSIS — R2681 Unsteadiness on feet: Secondary | ICD-10-CM | POA: Insufficient documentation

## 2015-02-14 NOTE — Therapy (Signed)
[name] [address] [place], [name], [address] Phone: [phone]   Fax:  [phone]  Occupational Therapy Treatment  Patient Details  Name: William [name] MRN: [id number] Date of Birth: Sep 05, 1928 No Data Recorded  Encounter Date: 02/14/2015      OT End of Session - 02/14/15 0814    Visit Number 7   Number of Visits 17   Date for OT Re-Evaluation 03/11/15   Authorization Type [employer], no visit limit, no auth required   Authorization - Visit Number 6   Authorization - Number of Visits 10   OT Start Time [phone]   OT Stop Time 0848   OT Time Calculation (min) 38 min   Activity Tolerance Patient tolerated treatment well   Behavior During Therapy [facility] for tasks assessed/performed      Past Medical History  Diagnosis Date  . Hyperlipidemia   . Hx of colonic polyps   . Benign prostatic hypertrophy with urinary obstruction   . TIA (transient ischemic attack)     pt does not recall this hx "but may have had one today" (08/09/2014)  . Subdural hematoma ([place]) 2006  . Gout   . Hypertension     sees [name]  . Thyroid disease     medullary thyroid   . Pneumonia     hx of in 1952  . Parkinson disease ([place])   . A-fib ([place])   . Complication of anesthesia     "difficulty intubation, had to use fiberoptic 2006"  . Difficult intubation   . 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 Primary skin squamous cell carcinoma   . Basal cell carcinoma   . Hypothyroidism   . DJD (degenerative joint disease)   . Arthritis     "right knee" (08/09/2014)  . Gout     "periodically" (08/09/2014)  . Kidney tumor     "tumor on cyst on kidney"   . DJD (degenerative joint disease)   . Parkinson disease [facility])     Past Surgical History  Procedure Laterality Date  . Cataract extraction w/ intraocular lens  implant, bilateral Bilateral ~ 1998  . Cholecystectomy    . Thyroidectomy     medullary carcinoma  . Eye surgery    . Knee arthroscopy Right   . Appendectomy    . Tonsillectomy    . Lumbar laminectomy/decompression microdiscectomy  05/14/2012    Procedure: LUMBAR LAMINECTOMY/DECOMPRESSION MICRODISCECTOMY 1 LEVEL;  Surgeon: [name], MD;  Location: [place] [facility];  Service: Neurosurgery;  Laterality: Left;  Left Lumbar four-five Laminectomy/Diskectomy/Far lateral diskectomy  . Cryoablation  07-10-12    "tumor on cyst on my kidney; had an ablation"  . Skin cancer excision  "several"    "back of my neck; right eye; clavicle; nose"  . Inguinal hernia repair Right   . Back surgery    . Subdural hematoma evacuation via craniotomy  2006  . Kidney surgery      There were no vitals filed for this visit.  Visit Diagnosis:  Bradykinesia  Rigidity  Lack of coordination      Subjective Assessment - 02/14/15 0812    Subjective  "I didn't get to do much this weekend"   Pertinent History PD (dx 2009), 07/2014 a-fib, TIA, hx of R craniotomy (11/2004), TIA (2003), thyroid CA, partial onset seizures   Patient Stated Goals improve ADLs and R hand use   Currently in Pain?  No/denies                      OT Treatments/Exercises (OP) - 02/14/15 0001    ADLs   UB Dressing Practiced buttoning/unbuttoning shirt on table using PWR! hands with min-mod incr in time.     Functional Mobility Functional step and reach to simulate putting dishes away from dishwasher.  Min-mod cues initially for big movement strategies (incorportating PWR! hands, trunk rotation, and wt. shift in prep for direction change).  Pt progressed to performing with big amplitude movements with R side after initial difficulty.  (less cues and incr ease noted with L side)   Writing Practiced writing name/address and copying sentences with approx 95% legibility and only occasional min decr in size    Continued checking STGs and discussed progress (see goals section).         PWR [facility]) - 02/14/15  1702    PWR! exercises Hands   PWR! Up x15   PWR! Rock x10    PWR! Twist x10   PWR! Step x5   Comments with min v.c., particularly for PWR! step             OT Education - 02/14/15 1658    Education Details turning strategy and keeping wide base of support for ADLs.   Person(s) Educated Patient   Methods Explanation;Demonstration;Verbal cues   Comprehension Verbalized understanding;Verbal cues required;Returned demonstration          OT Short Term Goals - 02/14/15 0817    OT SHORT TERM GOAL #1   Title Pt will be independent with PD-specific HEP.--check STGs 02/08/15   Time 4   Period Weeks   Status Achieved   OT SHORT TERM GOAL #2   Title Pt will be able to write at least 3 sentences with 100% legibility and no significant decrease in size.   Baseline 90%, min decr in size   Time 4   Period Weeks   Status On-going  02/14/15  95% with occasional min decr in size   OT SHORT TERM GOAL #3   Title Pt will improve coordination for ADLs as shown by improving time on 9-hole peg test by at least 8sec with RUE.   Baseline 60.44(eval)   Time 4   Period Weeks   Status Achieved  02/14/15   46.40sec   OT SHORT TERM GOAL #4   Title Pt will improve ability to eat as shown by completing PPT#2 in 12sec or less   Baseline 16.75 secs on 02/09/15; 13.59sec on 02/14/15   Time 4   Period Weeks   Status On-going   OT SHORT TERM GOAL #5   Title Pt will improve ability to button as shown by ability to button/unbutton 3 buttons with shirt on tabletop in 70sec or less.   Baseline 83.03 checked 02/09/15   Time 4   Period Weeks   Status On-going   OT SHORT TERM GOAL #6   Title Pt will verbalize understanding of ways to prevent future complications and appropriate PD-related community resources.   Time 4   Period Weeks   Status Achieved  02/07/15           OT Long Term Goals - 02/14/15 0824    OT LONG TERM GOAL #1   Title Pt will verbalize understanding of adaptive strategies/AE  to incr ease/efficiency with ADLs/IADLs prn.   Time 8   Period Weeks   Status New   OT LONG TERM GOAL #  2   Title Pt will be able to carry glass of liquid at least 97feet without spills x2.   Time 8   Period Weeks   Status New   OT LONG TERM GOAL #3   Title Pt will improve coordination for ADLs as shown completing 9-hole peg test in 40 sec or less with RUE.   Baseline 60.44   Time 8   Period Weeks   Status Revised   OT LONG TERM GOAL #4   Title Pt will improve coordination/functional reaching for ADLs as shown by improving score on box and blocks test by at least 6 bilaterally.   Baseline R-31, L-37 blocks   Time 8   Period Weeks   Status New   OT LONG TERM GOAL #5   Title Pt will improve balance/functional reaching for ADLs as shown by improving standing functional reach test to at least 10" with LUE.   Time 8   Period Weeks   Status New   OT LONG TERM GOAL #6   Title Pt will improve ability to button as shown by ability to button/unbutton 3 buttons with shirt on tabletop in 60sec or less.   Baseline 55sec   Time 8   Period Weeks   Status New               Plan - 02/14/15 0825    Clinical Impression Statement Pt is progressing towards remaining goals and demo improved coordination.  Revised LTG#3 due to progress made.   Plan continue with PWR! Moves, big movements for ADLs   OT Home Exercise Plan issued: seated PWR!; 01/31/15 PWR! Moves in supine, ways to prevent future complications, community resources; coordination HEP; 02/07/15 PWR! moves in [employer] and Agree with Plan of Care Patient        Problem List Patient Active Problem List   Diagnosis Date Noted  . HLD (hyperlipidemia)   . TIA (transient ischemic attack) 08/09/2014  . Chronic kidney disease 08/09/2014  . Atrial fibrillation ([place]) 08/09/2014  . Right renal mass   . Atrial fibrillation with rapid ventricular response ([place]) 07/17/2014  . Elevated troponin 07/17/2014  .  Diverticulosis of colon without hemorrhage 06/16/2013  . Post-operative hypothyroidism 06/16/2013  . Renal oncocytoma 06/09/2012  . LBP (low back pain) 06/09/2012  . Parkinson disease, symptomatic ([place]) 06/06/2011  . CARCINOMA, SKIN, SQUAMOUS CELL 12/10/2007  . MALIGNANT NEOPLASM OF THYROID GLAND 12/10/2007  . SUBDURAL HEMATOMA 12/10/2007  . BPH (benign prostatic hypertrophy) 12/10/2007  . Essential hypertension 04/22/2007  . Transient cerebral ischemia 04/22/2007  . Generalized atherosclerosis 04/22/2007  . Osteoarthritis 04/22/2007    [facility] 02/14/2015, 5:06 PM  [facility] [address] [place], [name], [address] Phone: [phone]   Fax:  [phone]  Name: William [name] MRN: [id number] Date of Birth: 05-Aug-1928  Vianne Bulls, OTR/L 02/14/2015 5:06 PM

## 2015-02-14 NOTE — Patient Instructions (Signed)
Try mint or sweet hard candy instead of lemon, which encourages saliva production Continue the loud "hey" 10 times at home

## 2015-02-14 NOTE — Therapy (Signed)
[facility] [address] [place], [name], [address] Phone: [phone]   Fax:  [phone]  Speech Language Pathology Treatment  Patient Details  Name: William [name] MRN: [id number] Date of Birth: [birth year] No Data Recorded  Encounter Date: 02/14/2015      End of Session - 02/14/15 0932    Visit Number 6   Number of Visits 41   SLP Start Time 0848   SLP Stop Time  8756   SLP Time Calculation (min) 43 min   Activity Tolerance Patient tolerated treatment well      Past Medical History  Diagnosis Date  . Hyperlipidemia   . Hx of colonic polyps   . Benign prostatic hypertrophy with urinary obstruction   . TIA (transient ischemic attack)     pt does not recall this hx "but may have had one today" (08/09/2014)  . Subdural hematoma ([place]) 2006  . Gout   . Hypertension     sees [name]  . Thyroid disease     medullary thyroid   . Pneumonia     hx of in 1952  . Parkinson disease ([place])   . A-fib ([place])   . Complication of anesthesia     "difficulty intubation, had to use fiberoptic 2006"  . Difficult intubation   . 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 Primary skin squamous cell carcinoma   . Basal cell carcinoma   . Hypothyroidism   . DJD (degenerative joint disease)   . Arthritis     "right knee" (08/09/2014)  . Gout     "periodically" (08/09/2014)  . Kidney tumor     "tumor on cyst on kidney"   . DJD (degenerative joint disease)   . Parkinson disease [facility])     Past Surgical History  Procedure Laterality Date  . Cataract extraction w/ intraocular lens  implant, bilateral Bilateral ~ 1998  . Cholecystectomy    . Thyroidectomy      medullary carcinoma  . Eye surgery    . Knee arthroscopy Right   . Appendectomy    . Tonsillectomy    . Lumbar laminectomy/decompression microdiscectomy  05/14/2012    Procedure: LUMBAR LAMINECTOMY/DECOMPRESSION MICRODISCECTOMY 1  LEVEL;  Surgeon: [name], MD;  Location: [place] [facility];  Service: Neurosurgery;  Laterality: Left;  Left Lumbar four-five Laminectomy/Diskectomy/Far lateral diskectomy  . Cryoablation  07-10-12    "tumor on cyst on my kidney; had an ablation"  . Skin cancer excision  "several"    "back of my neck; right eye; clavicle; nose"  . Inguinal hernia repair Right   . Back surgery    . Subdural hematoma evacuation via craniotomy  2006  . Kidney surgery      There were no vitals filed for this visit.  Visit Diagnosis: Dysarthria  Dysphagia      Subjective Assessment - 02/14/15 0852    Subjective Pt reports he did not practice loud speech over the weekend because he was "entertaining [name]' stepson" on Saturday.                ADULT SLP TREATMENT - 02/14/15 0853    General Information   Behavior/Cognition Alert;Cooperative;Pleasant mood   Treatment Provided   Treatment provided Dysphagia;Cognitive-Linquistic   Dysphagia Treatment   Temperature Spikes Noted No   Respiratory Status Room air   Type of PO's observed Dysphagia 3 (soft);Thin liquids   Feeding Able to feed self  Liquids provided via Cup   Oral Phase Signs & Symptoms --  no overt s/s noted   Pharyngeal Phase Signs & Symptoms --  no overt s/s noted   Amount of cueing --  no cueing necessary to follow aspiration precautions   Other treatment/comments Pt performed HEP with SLP assessing procedure. Pt req'd min-mod A with [place], usually. Pt with weak response exhibited by thyroid slowly slipping down with the "hold" portion. SLP reiterated to pt to complete ENTIRE HEP at least x2/day as pt has not been doing this.    Pain Assessment   Pain Assessment No/denies pain   Cognitive-Linquistic Treatment   Treatment focused on Dysarthria   Skilled Treatment "[name] still says she can understand me better." Pt's loud "hey" with appropriate voicing 90% (9/10). Pt attempted loud /a/ but pt with strained quality. Pt with  consistent hydrophonic voice throughout session, even prior to POs. Pt told SLP he bought gum and lemon drops to chew/suck on throughout the day which has reduced the frequency of hydrophonic voice. SLP discouraged use of lemon drops due to incr'd saliva production wiht sour items.           SLP Education - 02/14/15 0932    Education provided Yes   Education Details lemon drops encourage saliva production   Person(s) Educated Patient   Methods Explanation   Comprehension Verbalized understanding          SLP Short Term Goals - 02/14/15 1053    SLP SHORT TERM GOAL #1   Title pt to maintain l10 reps of oud "hey" with appropriate voicing over three sessions    Time 2   Period Weeks   Status Revised   SLP SHORT TERM GOAL #2   Title pt will maintain volume of average 70dB in 12 minutes simple/mod complex conversation over two sessions   Time 2   Period Weeks   Status On-going   SLP SHORT TERM GOAL #3   Title (if necessary) pt will complete swallowing HEP following possible modified barium swallow exam with rare min A   Time 2   Period Weeks   Status On-going          SLP Long Term Goals - 02/14/15 1055    SLP LONG TERM GOAL #1   Title pt will maintain loud /a/ average 83dB over 4 sessions   Time 6   Period Weeks   Status On-going   SLP LONG TERM GOAL #2   Title pt will engage in mod complex conversation of 10 minutes with average 71dB with rare min A   Time 6   Period Weeks   Status On-going   SLP LONG TERM GOAL #3   Title (if necessary) pt will complete swallowing HEP with modifed independence   Time 6   Period Weeks   Status On-going   SLP LONG TERM GOAL #4   Title pt will tell SLP 3 s/s aspiration PNA with modified independence   Time 6   Period Weeks   Status On-going          Plan - 02/14/15 0932    Clinical Impression Statement HEP for dysphagia with min A. Loud "hey" with good vocal quality, /a/ still with questionable vocal quality but better. Needs  SLP cues consistently for full breath with /a/.   Speech Therapy Frequency 2x / week   Duration --  5 weeks   Treatment/Interventions Aspiration precaution training;Pharyngeal strengthening exercises;Oral motor exercises;SLP instruction and feedback;Internal/external aids;Compensatory strategies;Patient/family  education;Functional tasks;Cueing hierarchy   Potential to Achieve Goals Good        Problem List Patient Active Problem List   Diagnosis Date Noted  . HLD (hyperlipidemia)   . TIA (transient ischemic attack) 08/09/2014  . Chronic kidney disease 08/09/2014  . Atrial fibrillation ([place]) 08/09/2014  . Right renal mass   . Atrial fibrillation with rapid ventricular response ([place]) 07/17/2014  . Elevated troponin 07/17/2014  . Diverticulosis of colon without hemorrhage 06/16/2013  . Post-operative hypothyroidism 06/16/2013  . Renal oncocytoma 06/09/2012  . LBP (low back pain) 06/09/2012  . Parkinson disease, symptomatic ([place]) 06/06/2011  . CARCINOMA, SKIN, SQUAMOUS CELL 12/10/2007  . MALIGNANT NEOPLASM OF THYROID GLAND 12/10/2007  . SUBDURAL HEMATOMA 12/10/2007  . BPH (benign prostatic hypertrophy) 12/10/2007  . Essential hypertension 04/22/2007  . Transient cerebral ischemia 04/22/2007  . Generalized atherosclerosis 04/22/2007  . Osteoarthritis 04/22/2007    [facility] , MS, CCC-SLP  02/14/2015, 10:56 AM  [facility] [address] [place], [name], [address] Phone: [phone]   Fax:  [phone]   Name: William [name] MRN: [id number] Date of Birth: Jun 24, 1928

## 2015-02-15 NOTE — Therapy (Signed)
[facility] [address] [place], [name], [address] Phone: [phone]   Fax:  [phone]  Physical Therapy Treatment  Patient Details  Name: William [name] MRN: [id number] Date of Birth: 02-12-1929 No Data Recorded  Encounter Date: 02/14/2015      PT End of Session - 02/15/15 0953    Visit Number 8   Number of Visits 13   Date for PT Re-Evaluation 03/11/15   Authorization Type [employer] H[employer]-G-code every 10th visit   PT Start Time 0937   PT Stop Time 1015   PT Time Calculation (min) 38 min   Equipment Utilized During Treatment Gait belt   Activity Tolerance Patient tolerated treatment well   Behavior During Therapy [facility] for tasks assessed/performed      Past Medical History  Diagnosis Date  . Hyperlipidemia   . Hx of colonic polyps   . Benign prostatic hypertrophy with urinary obstruction   . TIA (transient ischemic attack)     pt does not recall this hx "but may have had one today" (08/09/2014)  . Subdural hematoma ([place]) 2006  . Gout   . Hypertension     sees [name]  . Thyroid disease     medullary thyroid   . Pneumonia     hx of in 1952  . Parkinson disease ([place])   . A-fib ([place])   . Complication of anesthesia     "difficulty intubation, had to use fiberoptic 2006"  . Difficult intubation   . 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 Primary skin squamous cell carcinoma   . Basal cell carcinoma   . Hypothyroidism   . DJD (degenerative joint disease)   . Arthritis     "right knee" (08/09/2014)  . Gout     "periodically" (08/09/2014)  . Kidney tumor     "tumor on cyst on kidney"   . DJD (degenerative joint disease)   . Parkinson disease [facility])     Past Surgical History  Procedure Laterality Date  . Cataract extraction w/ intraocular lens  implant, bilateral Bilateral ~ 1998  . Cholecystectomy    . Thyroidectomy      medullary carcinoma  . Eye surgery    .  Knee arthroscopy Right   . Appendectomy    . Tonsillectomy    . Lumbar laminectomy/decompression microdiscectomy  05/14/2012    Procedure: LUMBAR LAMINECTOMY/DECOMPRESSION MICRODISCECTOMY 1 LEVEL;  Surgeon: [name], MD;  Location: [facility];  Service: Neurosurgery;  Laterality: Left;  Left Lumbar four-five Laminectomy/Diskectomy/Far lateral diskectomy  . Cryoablation  07-10-12    "tumor on cyst on my kidney; had an ablation"  . Skin cancer excision  "several"    "back of my neck; right eye; clavicle; nose"  . Inguinal hernia repair Right   . Back surgery    . Subdural hematoma evacuation via craniotomy  2006  . Kidney surgery      There were no vitals filed for this visit.  Visit Diagnosis:  Abnormality of gait  Bradykinesia  Unsteadiness      Subjective Assessment - 02/14/15 0938    Subjective No changes, nothing new; to see Dr. Rexene Alberts sometime this month   Currently in Pain? No/denies                         [facility] Adult PT Treatment/Exercise - 02/14/15 3614    Ambulation/Gait   Ambulation/Gait  Yes   Ambulation/Gait Assistance 5: Supervision   Ambulation/Gait Assistance Details Gait indoors x 500 ft using bilateral walking poles to facilitate arm swing and increased step length, progressing to no poles, with verbal cues for step length and arm swing.   Ambulation Distance (Feet) 500 Feet  then 200    Assistive device None   Gait Pattern Step-through pattern;Decreased dorsiflexion - right;Decreased dorsiflexion - left;Poor foot clearance - left;Poor foot clearance - right  decreased arm swing   Ambulation Surface Level;Indoor   Ramp 5: Supervision   Ramp Details (indicate cue type and reason) Ramp negotiation x 3 reps no device with supervision   High Level Balance   High Level Balance Comments In parallel bars on rockerboard:  hip/ankle strategy work, then head turns/nods with eyes open, then eyes closed x 10 seconds with intermittent UE support  and min guard assistance.  Marching in place, forward kicks, head turns/head nods with eyes open, then eyes closed x 10 seconds also performed on compliant balance beam surface as well as incline/decline.  All performed for improved dynamic balance. On compliant mat surface-forward/back walking, side stepping, marching forward and back 3 reps x 10 ft each direction, with min guard assistance. Figure-8's on compliant mat surface x 3 reps no device with min guard assistance.     Exercises   Exercises Ankle   Ankle Exercises: Standing   Heel Raises 15 reps   Toe Raise 15 reps                 PT Short Term Goals - 02/07/15 1307    PT SHORT TERM GOAL #1   Title Pt will perform HEP with supervision for improved functional mobility, transfers, balance and gait.  TARGET 02/09/15   Time 4   Period Weeks   Status Partially Met   PT SHORT TERM GOAL #2   Title Pt will improve 5x sit<>stand to less than or equal to 15 seconds for improved efficiency and safety with transfers.   Time 4   Period Weeks   Status Achieved   PT SHORT TERM GOAL #3   Title Pt will improve TUG cognitive to less than or equal to 15 seconds for decreased fall risk/improved dual tasking with gait.   Time 4   Period Weeks   Status Partially Met   PT SHORT TERM GOAL #4   Title Pt will verbalize understanding of local Parkinson's resources.   Time 4   Period Weeks   Status Partially Met           PT Long Term Goals - 02/07/15 1309    PT LONG TERM GOAL #1   Title Pt will verbalize understanding of fall prevention within the home environment.  TARGET 03/11/15   Time 6   Period Weeks   Status On-going   PT LONG TERM GOAL #2   Title Pt will perform at least 8 of 10 reps of sit<>stand transfers from 18 inch surfaces or lower, with minimal to no UE support, for improved safety and efficiency with transfers.   Time 6   Period Weeks   Status On-going   PT LONG TERM GOAL #3   Title Pt will improve Functional Gait  Assessment to at least 19/30 for decreased fall risk.   Time 6   Period Weeks   Status On-going   PT LONG TERM GOAL #4   Title Pt will verbalize plans for continued community fitness upon D/C from PT.   Time 6  Period Weeks   Status On-going               Plan - 02/15/15 0954    Clinical Impression Statement Pt continues to demonstrate improvement in balance on compliant surfaces; however, he continues to need cues for increased amplitude of movement with gait for improved arm swing and improved foot clearance.  Pt will continue to benefit from further skilled PT to address balance and gait.   Pt will benefit from skilled therapeutic intervention in order to improve on the following deficits Abnormal gait;Decreased balance;Decreased mobility;Decreased strength;Difficulty walking;Postural dysfunction   Rehab Potential Good   PT Frequency 2x / week   PT Duration 6 weeks  wk 4 of 6   PT Treatment/Interventions ADLs/Self Care Home Management;Therapeutic activities;Therapeutic exercise;Functional mobility training;Gait training;Balance training;Neuromuscular re-education;Patient/family education   PT Next Visit Plan Gait for intensity and posture on varied surfaces and with environmental scanning, continue balance activities on compliant surfaces, transfers   PT Home Exercise Plan PWR! HEP   Consulted and Agree with Plan of Care Patient        Problem List Patient Active Problem List   Diagnosis Date Noted  . HLD (hyperlipidemia)   . TIA (transient ischemic attack) 08/09/2014  . Chronic kidney disease 08/09/2014  . Atrial fibrillation ([place]) 08/09/2014  . Right renal mass   . Atrial fibrillation with rapid ventricular response ([place]) 07/17/2014  . Elevated troponin 07/17/2014  . Diverticulosis of colon without hemorrhage 06/16/2013  . Post-operative hypothyroidism 06/16/2013  . Renal oncocytoma 06/09/2012  . LBP (low back pain) 06/09/2012  . Parkinson disease, symptomatic  ([place]) 06/06/2011  . CARCINOMA, SKIN, SQUAMOUS CELL 12/10/2007  . MALIGNANT NEOPLASM OF THYROID GLAND 12/10/2007  . SUBDURAL HEMATOMA 12/10/2007  . BPH (benign prostatic hypertrophy) 12/10/2007  . Essential hypertension 04/22/2007  . Transient cerebral ischemia 04/22/2007  . Generalized atherosclerosis 04/22/2007  . Osteoarthritis 04/22/2007    Matayah Reyburn W. 02/15/2015, 9:56 AM  Frazier Butt., PT [facility] [address] [place], [name], [address] Phone: [phone]   Fax:  [phone]  Name: NORVELL CASWELL MRN: [id number] Date of Birth: 30-Nov-1928

## 2015-02-16 ENCOUNTER — Ambulatory Visit: Payer: Commercial Managed Care - HMO | Admitting: Occupational Therapy

## 2015-02-16 ENCOUNTER — Ambulatory Visit: Payer: Commercial Managed Care - HMO | Admitting: Physical Therapy

## 2015-02-16 ENCOUNTER — Encounter: Payer: Self-pay | Admitting: Occupational Therapy

## 2015-02-16 ENCOUNTER — Ambulatory Visit: Payer: Commercial Managed Care - HMO

## 2015-02-16 DIAGNOSIS — R1310 Dysphagia, unspecified: Secondary | ICD-10-CM

## 2015-02-16 DIAGNOSIS — R471 Dysarthria and anarthria: Secondary | ICD-10-CM | POA: Diagnosis not present

## 2015-02-16 DIAGNOSIS — R29898 Other symptoms and signs involving the musculoskeletal system: Secondary | ICD-10-CM

## 2015-02-16 DIAGNOSIS — R2681 Unsteadiness on feet: Secondary | ICD-10-CM | POA: Diagnosis not present

## 2015-02-16 DIAGNOSIS — R258 Other abnormal involuntary movements: Secondary | ICD-10-CM

## 2015-02-16 DIAGNOSIS — R279 Unspecified lack of coordination: Secondary | ICD-10-CM | POA: Diagnosis not present

## 2015-02-16 DIAGNOSIS — R269 Unspecified abnormalities of gait and mobility: Secondary | ICD-10-CM | POA: Diagnosis not present

## 2015-02-16 NOTE — Patient Instructions (Signed)

## 2015-02-16 NOTE — Therapy (Signed)
[name] [address] [name], [name], [name] MRN: [id number] Date of Birth: 09-11-1928 No Data Recorded  Encounter Date: 02/16/2015      OT End of Session - 02/16/15 0956    Visit Number 8   Number of Visits 17   Date for OT Re-Evaluation 03/11/15   Authorization Type [employer] H[employer], no visit limit, no auth required   Authorization - Visit Number 8   Authorization - Number of Visits 10   OT Start Time [id number]   OT Stop Time 1015   OT Time Calculation (min) 42 min   Activity Tolerance Patient tolerated treatment well   Behavior During Therapy [facility] for tasks assessed/performed      Past Medical History  Diagnosis Date  . Hyperlipidemia   . Hx of colonic polyps   . Benign prostatic hypertrophy with urinary obstruction   . TIA (transient ischemic attack)     pt does not recall this hx "but may have had one today" (08/09/2014)  . Subdural hematoma ([place]) 2006  . Gout   . Hypertension     sees [name]  . Thyroid disease     medullary thyroid   . Pneumonia     hx of in 1952  . Parkinson disease ([place])   . A-fib ([place])   . Complication of anesthesia     "difficulty intubation, had to use fiberoptic 2006"  . Difficult intubation   . 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 Primary skin squamous cell carcinoma   . Basal cell carcinoma   . Hypothyroidism   . DJD (degenerative joint disease)   . Arthritis     "right knee" (08/09/2014)  . Gout     "periodically" (08/09/2014)  . Kidney tumor     "tumor on cyst on kidney"   . DJD (degenerative joint disease)   . Parkinson disease [facility])     Past Surgical History  Procedure Laterality Date  . Cataract extraction w/ intraocular lens  implant, bilateral Bilateral ~ 1998  . Cholecystectomy    . Thyroidectomy     medullary carcinoma  . Eye surgery    . Knee arthroscopy Right   . Appendectomy    . Tonsillectomy    . Lumbar laminectomy/decompression microdiscectomy  05/14/2012    Procedure: LUMBAR LAMINECTOMY/DECOMPRESSION MICRODISCECTOMY 1 LEVEL;  Surgeon: [name], MD;  Location: [facility];  Service: Neurosurgery;  Laterality: Left;  Left Lumbar four-five Laminectomy/Diskectomy/Far lateral diskectomy  . Cryoablation  07-10-12    "tumor on cyst on my kidney; had an ablation"  . Skin cancer excision  "several"    "back of my neck; right eye; clavicle; nose"  . Inguinal hernia repair Right   . Back surgery    . Subdural hematoma evacuation via craniotomy  2006  . Kidney surgery      There were no vitals filed for this visit.  Visit Diagnosis:  Bradykinesia  Rigidity  Lack of coordination      Subjective Assessment - 02/16/15 0935    Subjective  "doing good"   Pertinent History PD (dx 2009), 07/2014 a-fib, TIA, hx of R craniotomy (11/2004), TIA (2003), thyroid CA, partial onset seizures   Patient Stated Goals improve ADLs and R hand use   Currently in Pain? No/denies  PWR [facility]) - 02/16/15 0954    PWR! exercises Hands   PWR! Up x15   PWR! Rock x10   PWR! Twist x10   PWR! Step x8   Comments PWR! hands with min v.c. particularly for PWR! step             OT Education - 02/16/15 0951    Education Details coordination HEP review (min to occasional mod cueing for big movement strategies) and instructed pt in how it relates to function, Instructed pt in PWR! hands HEP (basic 4) and pt returned demo each; Making muscles more active to decr tremors for short periods of time when carrying objects/reading.   Person(s) Educated Patient   Methods Explanation;Verbal cues;Demonstration;Tactile cues   Comprehension Verbalized understanding;Returned demonstration;Verbal cues required          OT Short Term Goals - 02/14/15 0817    OT SHORT  TERM GOAL #1   Title Pt will be independent with PD-specific HEP.--check STGs 02/08/15   Time 4   Period Weeks   Status Achieved   OT SHORT TERM GOAL #2   Title Pt will be able to write at least 3 sentences with 100% legibility and no significant decrease in size.   Baseline 90%, min decr in size   Time 4   Period Weeks   Status On-going  02/14/15  95% with occasional min decr in size   OT SHORT TERM GOAL #3   Title Pt will improve coordination for ADLs as shown by improving time on 9-hole peg test by at least 8sec with RUE.   Baseline 60.44(eval)   Time 4   Period Weeks   Status Achieved  02/14/15   46.40sec   OT SHORT TERM GOAL #4   Title Pt will improve ability to eat as shown by completing PPT#2 in 12sec or less   Baseline 16.75 secs on 02/09/15; 13.59sec on 02/14/15   Time 4   Period Weeks   Status On-going   OT SHORT TERM GOAL #5   Title Pt will improve ability to button as shown by ability to button/unbutton 3 buttons with shirt on tabletop in 70sec or less.   Baseline 83.03 checked 02/09/15   Time 4   Period Weeks   Status On-going   OT SHORT TERM GOAL #6   Title Pt will verbalize understanding of ways to prevent future complications and appropriate PD-related community resources.   Time 4   Period Weeks   Status Achieved  02/07/15           OT Long Term Goals - 02/14/15 0824    OT LONG TERM GOAL #1   Title Pt will verbalize understanding of adaptive strategies/AE to incr ease/efficiency with ADLs/IADLs prn.   Time 8   Period Weeks   Status New   OT LONG TERM GOAL #2   Title Pt will be able to carry glass of liquid at least 55feet without spills x2.   Time 8   Period Weeks   Status New   OT LONG TERM GOAL #3   Title Pt will improve coordination for ADLs as shown completing 9-hole peg test in 40 sec or less with RUE.   Baseline 60.44   Time 8   Period Weeks   Status Revised   OT LONG TERM GOAL #4   Title Pt will improve coordination/functional reaching  for ADLs as shown by improving score on box and blocks test by at least 6 bilaterally.   Baseline  R-31, L-37 blocks   Time 8   Period Weeks   Status New   OT LONG TERM GOAL #5   Title Pt will improve balance/functional reaching for ADLs as shown by improving standing functional reach test to at least 10" with LUE.   Time 8   Period Weeks   Status New   OT LONG TERM GOAL #6   Title Pt will improve ability to button as shown by ability to button/unbutton 3 buttons with shirt on tabletop in 60sec or less.   Baseline 55sec   Time 8   Period Weeks   Status New               Plan - 02/16/15 1028    Clinical Impression Statement Pt is progressing towards goals and continues to  respond well to cues for big movement strategies.   Plan step and reach, carrying objects, big movement strategies for ADLs,    OT Home Exercise Plan issued: seated PWR!; 01/31/15 PWR! Moves in supine, ways to prevent future complications, community resources; coordination HEP; 02/07/15 PWR! moves in quadraped; 02/16/15 PWR! hands (basic 4)   Consulted and Agree with Plan of Care Patient        Problem List Patient Active Problem List   Diagnosis Date Noted  . HLD (hyperlipidemia)   . TIA (transient ischemic attack) 08/09/2014  . Chronic kidney disease 08/09/2014  . Atrial fibrillation ([place]) 08/09/2014  . Right renal mass   . Atrial fibrillation with rapid ventricular response ([place]) 07/17/2014  . Elevated troponin 07/17/2014  . Diverticulosis of colon without hemorrhage 06/16/2013  . Post-operative hypothyroidism 06/16/2013  . Renal oncocytoma 06/09/2012  . LBP (low back pain) 06/09/2012  . Parkinson disease, symptomatic ([place]) 06/06/2011  . CARCINOMA, SKIN, SQUAMOUS CELL 12/10/2007  . MALIGNANT NEOPLASM OF THYROID GLAND 12/10/2007  . SUBDURAL HEMATOMA 12/10/2007  . BPH (benign prostatic hypertrophy) 12/10/2007  . Essential hypertension 04/22/2007  . Transient cerebral ischemia 04/22/2007  .  Generalized atherosclerosis 04/22/2007  . Osteoarthritis 04/22/2007    [facility] 02/16/2015, 5:51 PM  [facility] [address] [place], [name], [address] Phone: [phone]   Fax:  [phone]  Name: William [name] MRN: [id number] Date of Birth: Oct 23, 1928  Vianne Bulls, OTR/L 02/16/2015 5:51 PM

## 2015-02-16 NOTE — Patient Instructions (Signed)
   Use that gum or lozenges to help control your saliva and "wet" voice.  Continue to practice the loud "hey" x10, twice a day.

## 2015-02-16 NOTE — Therapy (Signed)
[facility] [address] [place], [name], [address] Phone: [phone]   Fax:  [phone]  Speech Language Pathology Treatment  Patient Details  Name: William [name] MRN: [id number] Date of Birth: 01-05-1929 No Data Recorded  Encounter Date: 02/16/2015      End of Session - 02/16/15 1034    Visit Number 7   Number of Visits 17   SLP Start Time 1607   SLP Stop Time  1100   SLP Time Calculation (min) 39 min   Activity Tolerance Patient tolerated treatment well      Past Medical History  Diagnosis Date  . Hyperlipidemia   . Hx of colonic polyps   . Benign prostatic hypertrophy with urinary obstruction   . TIA (transient ischemic attack)     pt does not recall this hx "but may have had one today" (08/09/2014)  . Subdural hematoma ([place]) 2006  . Gout   . Hypertension     sees [name]  . Thyroid disease     medullary thyroid   . Pneumonia     hx of in 1952  . Parkinson disease ([place])   . A-fib ([place])   . Complication of anesthesia     "difficulty intubation, had to use fiberoptic 2006"  . Difficult intubation   . 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 Primary skin squamous cell carcinoma   . Basal cell carcinoma   . Hypothyroidism   . DJD (degenerative joint disease)   . Arthritis     "right knee" (08/09/2014)  . Gout     "periodically" (08/09/2014)  . Kidney tumor     "tumor on cyst on kidney"   . DJD (degenerative joint disease)   . Parkinson disease [facility])     Past Surgical History  Procedure Laterality Date  . Cataract extraction w/ intraocular lens  implant, bilateral Bilateral ~ 1998  . Cholecystectomy    . Thyroidectomy      medullary carcinoma  . Eye surgery    . Knee arthroscopy Right   . Appendectomy    . Tonsillectomy    . Lumbar laminectomy/decompression microdiscectomy  05/14/2012    Procedure: LUMBAR LAMINECTOMY/DECOMPRESSION MICRODISCECTOMY 1  LEVEL;  Surgeon: [name], MD;  Location: [facility];  Service: Neurosurgery;  Laterality: Left;  Left Lumbar four-five Laminectomy/Diskectomy/Far lateral diskectomy  . Cryoablation  07-10-12    "tumor on cyst on my kidney; had an ablation"  . Skin cancer excision  "several"    "back of my neck; right eye; clavicle; nose"  . Inguinal hernia repair Right   . Back surgery    . Subdural hematoma evacuation via craniotomy  2006  . Kidney surgery      There were no vitals filed for this visit.  Visit Diagnosis: Dysarthria  Dysphagia      Subjective Assessment - 02/16/15 1034    Subjective Pt doing more practice with loud "Hey!" at home since last session.               ADULT SLP TREATMENT - 02/16/15 1026    General Information   Behavior/Cognition Alert;Cooperative;Pleasant mood   Treatment Provided   Treatment provided Cognitive-Linquistic   Dysphagia Treatment   Other treatment/comments SLP needed to cue pt to use gum to better control saliva production/hydrophonic voice. SLP used teachback method to reaquaint pt with effortful swallow with all POs, and with gum/lozenge. Educated pt re:  anatomy of why he is likely experiencing hydrophonic voice.    Pain Assessment   Pain Assessment No/denies pain   Cognitive-Linquistic Treatment   Treatment focused on Dysarthria   Skilled Treatment Loud "Hey!" completed with 100% appropriate voicing. SLP attempted pt with longer loud "hey" trials, x3 reps per set, with success with appropriate voicing. "[name] says I'm improving - in my normal conversation."  Pt, in fact, was able to maintain 70dB in 10 minutes simple conversation. In mod complex-complex conversation pt maintained louder WNL speech at or above 70dB 90% of the time over 15 minutes, with some hydrophonic voice.   Assessment / Recommendations / Plan   Plan Continue with current plan of care   Progression Toward Goals   Progression toward goals Progressing toward goals           SLP Education - 02/16/15 1033    Education provided Yes   Education Details loud "hey", longer, loud "hey", mechanics/explanation of hydrophonic voice, need for effortful swallow with all swallows   Person(s) Educated Patient   Methods Explanation;Demonstration;Verbal cues   Comprehension Verbalized understanding;Returned demonstration;Verbal cues required          SLP Short Term Goals - 02/16/15 1110    SLP SHORT TERM GOAL #1   Title pt to maintain 10 reps of oud "hey" with appropriate voicing over three sessions    Time 2   Period Weeks   Status Revised   SLP SHORT TERM GOAL #2   Title pt will maintain volume of average 70dB in 12 minutes simple/mod complex conversation over two sessions   Time --   Period --   Status Achieved   SLP [place] #3   Title (if necessary) pt will complete swallowing HEP following possible modified barium swallow exam with rare min A   Time 2   Period Weeks   Status On-going          SLP Long Term Goals - 02/16/15 1111    SLP LONG TERM GOAL #1   Title pt will maintain loud /a/ average 83dB over 3 sessions   Time 6   Period Weeks   Status Revised   SLP LONG TERM GOAL #2   Title pt will engage in mod complex conversation of 10 minutes with average 71dB with rare min A   Status Achieved   SLP LONG TERM GOAL #3   Title (if necessary) pt will complete swallowing HEP with modifed independence   Time 6   Period Weeks   Status On-going   SLP LONG TERM GOAL #4   Title pt will tell SLP 3 s/s aspiration PNA with modified independence   Time 6   Period Weeks   Status On-going          Plan - 02/16/15 1107    Clinical Impression Statement Loud "hey" with good vocal quality, and loud, longer "hey" with good vocal quality. SLP would like to transition pt to loud sustained /a/. Pt's volume in 15 minutes conversation was WNL, however sitll needs SLP A for swallowing HEP and transition to loud  sustained /a/.    Speech Therapy  Frequency 2x / week   Duration --  5 weeks   Treatment/Interventions Aspiration precaution training;Pharyngeal strengthening exercises;Oral motor exercises;SLP instruction and feedback;Internal/external aids;Compensatory strategies;Patient/family education;Functional tasks;Cueing hierarchy   Potential to Achieve Goals Good        Problem List Patient Active Problem List   Diagnosis Date Noted  . HLD (hyperlipidemia)   .  TIA (transient ischemic attack) 08/09/2014  . Chronic kidney disease 08/09/2014  . Atrial fibrillation ([place]) 08/09/2014  . Right renal mass   . Atrial fibrillation with rapid ventricular response ([place]) 07/17/2014  . Elevated troponin 07/17/2014  . Diverticulosis of colon without hemorrhage 06/16/2013  . Post-operative hypothyroidism 06/16/2013  . Renal oncocytoma 06/09/2012  . LBP (low back pain) 06/09/2012  . Parkinson disease, symptomatic ([place]) 06/06/2011  . CARCINOMA, SKIN, SQUAMOUS CELL 12/10/2007  . MALIGNANT NEOPLASM OF THYROID GLAND 12/10/2007  . SUBDURAL HEMATOMA 12/10/2007  . BPH (benign prostatic hypertrophy) 12/10/2007  . Essential hypertension 04/22/2007  . Transient cerebral ischemia 04/22/2007  . Generalized atherosclerosis 04/22/2007  . Osteoarthritis 04/22/2007    [facility] , [place], CCC-SLP   02/16/2015, 11:28 AM  [facility] [address] [place], [name], [address] Phone: [phone]   Fax:  [phone]   Name: [place] [name] MRN: [id number] Date of Birth: April 24, 1928

## 2015-02-17 NOTE — Therapy (Signed)
[facility] [address] [place], [name], [address] Phone: [phone]   Fax:  [phone]  Physical Therapy Treatment  Patient Details  Name: William [name] MRN: [id number] Date of Birth: 02-15-1929 No Data Recorded  Encounter Date: 02/16/2015      PT End of Session - 02/17/15 1150    Visit Number 9   Number of Visits 13   Date for PT Re-Evaluation 03/11/15   Authorization Type [employer] H[employer]-G-code every 10th visit   PT Start Time 0850   PT Stop Time 0931   PT Time Calculation (min) 41 min   Equipment Utilized During Treatment Gait belt   Activity Tolerance Patient tolerated treatment well   Behavior During Therapy [facility] for tasks assessed/performed      Past Medical History  Diagnosis Date  . Hyperlipidemia   . Hx of colonic polyps   . Benign prostatic hypertrophy with urinary obstruction   . TIA (transient ischemic attack)     pt does not recall this hx "but may have had one today" (08/09/2014)  . Subdural hematoma ([place]) 2006  . Gout   . Hypertension     sees [name]  . Thyroid disease     medullary thyroid   . Pneumonia     hx of in 1952  . Parkinson disease ([place])   . A-fib ([place])   . Complication of anesthesia     "difficulty intubation, had to use fiberoptic 2006"  . Difficult intubation   . 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 Primary skin squamous cell carcinoma   . Basal cell carcinoma   . Hypothyroidism   . DJD (degenerative joint disease)   . Arthritis     "right knee" (08/09/2014)  . Gout     "periodically" (08/09/2014)  . Kidney tumor     "tumor on cyst on kidney"   . DJD (degenerative joint disease)   . Parkinson disease [facility])     Past Surgical History  Procedure Laterality Date  . Cataract extraction w/ intraocular lens  implant, bilateral Bilateral ~ 1998  . Cholecystectomy    . Thyroidectomy      medullary carcinoma  . Eye surgery    .  Knee arthroscopy Right   . Appendectomy    . Tonsillectomy    . Lumbar laminectomy/decompression microdiscectomy  05/14/2012    Procedure: LUMBAR LAMINECTOMY/DECOMPRESSION MICRODISCECTOMY 1 LEVEL;  Surgeon: [name], MD;  Location: [facility];  Service: Neurosurgery;  Laterality: Left;  Left Lumbar four-five Laminectomy/Diskectomy/Far lateral diskectomy  . Cryoablation  07-10-12    "tumor on cyst on my kidney; had an ablation"  . Skin cancer excision  "several"    "back of my neck; right eye; clavicle; nose"  . Inguinal hernia repair Right   . Back surgery    . Subdural hematoma evacuation via craniotomy  2006  . Kidney surgery      There were no vitals filed for this visit.  Visit Diagnosis:  Bradykinesia  Abnormality of gait      Subjective Assessment - 02/16/15 0853    Subjective No changes, no falls.   Currently in Pain? No/denies                         [facility] Adult PT Treatment/Exercise - 02/16/15 0857    Ambulation/Gait   Ambulation/Gait Yes   Ambulation/Gait Assistance 5: Supervision  Ambulation/Gait Assistance Details Initial cues for intensity for large amplitude arm swing and foot clearance   Ambulation Distance (Feet) 400 Feet   Assistive device None   Gait Pattern --  Improved arm swing, foot clearance, trunk rotation noted    Ambulation Surface Level;Indoor   High Level Balance   High Level Balance Comments Compliant surface activities on red mat surface-forward/back walking, forward/back marching, sidestepping, 3 reps x 10 ft each direction with min guard assistance.   Sidestep taps and cone kicks for improved dynamic single limb stance, with min assist required for balance.  Step up/up down/down x 10 reps at 6" step, bilaterally with min UE support; step up/up-down/down from incline ramp surface, with min guard assistance.  Standing on ramp incline/decline marching in place x 10 reps, forward step taps x 10 reps, then standing in place  with head turns/head nods then eyes closed x 10 seconds.   Exercises   Exercises Knee/Hip   Knee/Hip Exercises: Aerobic   Stationary Bike Scifit level 2.5 all 4 extremities x 8 minutes focusing on rpm and bouts of increased intensity.   Able to keep intensity 80-90 RPM                PT Short Term Goals - 02/07/15 1307    PT SHORT TERM GOAL #1   Title Pt will perform HEP with supervision for improved functional mobility, transfers, balance and gait.  TARGET 02/09/15   Time 4   Period Weeks   Status Partially Met   PT SHORT TERM GOAL #2   Title Pt will improve 5x sit<>stand to less than or equal to 15 seconds for improved efficiency and safety with transfers.   Time 4   Period Weeks   Status Achieved   PT SHORT TERM GOAL #3   Title Pt will improve TUG cognitive to less than or equal to 15 seconds for decreased fall risk/improved dual tasking with gait.   Time 4   Period Weeks   Status Partially Met   PT SHORT TERM GOAL #4   Title Pt will verbalize understanding of local Parkinson's resources.   Time 4   Period Weeks   Status Partially Met           PT Long Term Goals - 02/07/15 1309    PT LONG TERM GOAL #1   Title Pt will verbalize understanding of fall prevention within the home environment.  TARGET 03/11/15   Time 6   Period Weeks   Status On-going   PT LONG TERM GOAL #2   Title Pt will perform at least 8 of 10 reps of sit<>stand transfers from 18 inch surfaces or lower, with minimal to no UE support, for improved safety and efficiency with transfers.   Time 6   Period Weeks   Status On-going   PT LONG TERM GOAL #3   Title Pt will improve Functional Gait Assessment to at least 19/30 for decreased fall risk.   Time 6   Period Weeks   Status On-going   PT LONG TERM GOAL #4   Title Pt will verbalize plans for continued community fitness upon D/C from PT.   Time 6   Period Weeks   Status On-going               Plan - 02/17/15 1151    Clinical  Impression Statement Pt continues to demonstrate improvement in balance activities and with gait activities in this session.  He is able to utilize  larger amplitude movement patterns with gait, with improved overall gait pattern noted.  Pt will continue to benefit from further skilled PT to address balance and gait.   Pt will benefit from skilled therapeutic intervention in order to improve on the following deficits Abnormal gait;Decreased balance;Decreased mobility;Decreased strength;Difficulty walking;Postural dysfunction   Rehab Potential Good   PT Frequency 2x / week   PT Duration 6 weeks  wk 4 of 6   PT Treatment/Interventions ADLs/Self Care Home Management;Therapeutic activities;Therapeutic exercise;Functional mobility training;Gait training;Balance training;Neuromuscular re-education;Patient/family education   PT Next Visit Plan Gait for intensity and posture on varied surfaces and with environmental scanning, continue balance activities on compliant surfaces, transfers; Discuss community fitness; GCODE NEXT VISIT   PT Home Exercise Plan PWR! HEP   Consulted and Agree with Plan of Care Patient        Problem List Patient Active Problem List   Diagnosis Date Noted  . HLD (hyperlipidemia)   . TIA (transient ischemic attack) 08/09/2014  . Chronic kidney disease 08/09/2014  . Atrial fibrillation ([place]) 08/09/2014  . Right renal mass   . Atrial fibrillation with rapid ventricular response ([place]) 07/17/2014  . Elevated troponin 07/17/2014  . Diverticulosis of colon without hemorrhage 06/16/2013  . Post-operative hypothyroidism 06/16/2013  . Renal oncocytoma 06/09/2012  . LBP (low back pain) 06/09/2012  . Parkinson disease, symptomatic ([place]) 06/06/2011  . CARCINOMA, SKIN, SQUAMOUS CELL 12/10/2007  . MALIGNANT NEOPLASM OF THYROID GLAND 12/10/2007  . SUBDURAL HEMATOMA 12/10/2007  . BPH (benign prostatic hypertrophy) 12/10/2007  . Essential hypertension 04/22/2007  . Transient cerebral  ischemia 04/22/2007  . Generalized atherosclerosis 04/22/2007  . Osteoarthritis 04/22/2007    Artis Beggs W. 02/17/2015, 11:54 AM Frazier Butt., PT [facility] [address] [place], [name], [address] Phone: [phone]   Fax:  [phone]  Name: William [name] MRN: [id number] Date of Birth: 1928/12/19

## 2015-02-22 ENCOUNTER — Ambulatory Visit: Payer: Commercial Managed Care - HMO

## 2015-02-22 ENCOUNTER — Ambulatory Visit: Payer: Commercial Managed Care - HMO | Admitting: Occupational Therapy

## 2015-02-22 ENCOUNTER — Ambulatory Visit: Payer: Commercial Managed Care - HMO | Admitting: Physical Therapy

## 2015-02-22 DIAGNOSIS — R258 Other abnormal involuntary movements: Secondary | ICD-10-CM | POA: Diagnosis not present

## 2015-02-22 DIAGNOSIS — R29898 Other symptoms and signs involving the musculoskeletal system: Secondary | ICD-10-CM

## 2015-02-22 DIAGNOSIS — R2681 Unsteadiness on feet: Secondary | ICD-10-CM | POA: Diagnosis not present

## 2015-02-22 DIAGNOSIS — R471 Dysarthria and anarthria: Secondary | ICD-10-CM

## 2015-02-22 DIAGNOSIS — R1310 Dysphagia, unspecified: Secondary | ICD-10-CM

## 2015-02-22 DIAGNOSIS — R269 Unspecified abnormalities of gait and mobility: Secondary | ICD-10-CM | POA: Diagnosis not present

## 2015-02-22 DIAGNOSIS — R279 Unspecified lack of coordination: Secondary | ICD-10-CM | POA: Diagnosis not present

## 2015-02-22 NOTE — Patient Instructions (Addendum)
Optimal Fitness Program after Therapy for People with [name]  1)  Therapy Home Exercise Program  -Do these Exercises DAILY as instructed by your therapist  -Big, deliberate effort with exercises  -These exercises are important to perform consistently, even when therapist has  finished, because these therapy exercises often address your specific Parkinson's difficulties  2)  Walking  -  Work up to walking 3-5 times per week, 20-30 minutes per day  -This can be done at home, driveway, quiet street or an indoor track  -Focus should be on your Best posture, arm swing, step length for your best walking pattern  3)  Aerobic Exercise  -Work up to 3-5 times per week, 30 minutes per day  -This can be stationary bike, seated stepper machine, elliptical machine  -Work up to 7-8/10 intensity during the exercise, at minimal to moderate     Resistance     (Exercise) Monday Tuesday Wednesday Thursday Friday Saturday Sunday                                                                                                       

## 2015-02-22 NOTE — Patient Instructions (Addendum)
   I may suggest referral to an ear, nose and throat MD as a precaution, given your consistent rough voice and history of medullary carcinoma.  ======================================================================================  Signs of Aspiration Pneumonia   . Chest pain/tightness . Fever (can be low grade) . Cough  o With foul-smelling phlegm (sputum) o With sputum containing pus or blood o With greenish sputum . Fatigue  . Shortness of breath  . Wheezing   **IF YOU HAVE THESE SIGNS, CONTACT YOUR DOCTOR OR GO TO THE EMERGENCY DEPARTMENT OR URGENT CARE AS SOON AS POSSIBLE**

## 2015-02-22 NOTE — Therapy (Signed)
[facility] [address] [place], [name], [address] Phone: [phone]   Fax:  [phone]  Speech Language Pathology Treatment  Patient Details  Name: William [name] MRN: [id number] Date of Birth: [birth year] No Data Recorded  Encounter Date: 02/22/2015    Past Medical History  Diagnosis Date  . Hyperlipidemia   . Hx of colonic polyps   . Benign prostatic hypertrophy with urinary obstruction   . TIA (transient ischemic attack)     pt does not recall this hx "but may have had one today" (08/09/2014)  . Subdural hematoma ([place]) 2006  . Gout   . Hypertension     sees [name]  . Thyroid disease     medullary thyroid   . Pneumonia     hx of in 1952  . Parkinson disease ([place])   . A-fib ([place])   . Complication of anesthesia     "difficulty intubation, had to use fiberoptic 2006"  . Difficult intubation   . 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 Primary skin squamous cell carcinoma   . Basal cell carcinoma   . Hypothyroidism   . DJD (degenerative joint disease)   . Arthritis     "right knee" (08/09/2014)  . Gout     "periodically" (08/09/2014)  . Kidney tumor     "tumor on cyst on kidney"   . DJD (degenerative joint disease)   . Parkinson disease [facility])     Past Surgical History  Procedure Laterality Date  . Cataract extraction w/ intraocular lens  implant, bilateral Bilateral ~ 1998  . Cholecystectomy    . Thyroidectomy      medullary carcinoma  . Eye surgery    . Knee arthroscopy Right   . Appendectomy    . Tonsillectomy    . Lumbar laminectomy/decompression microdiscectomy  05/14/2012    Procedure: LUMBAR LAMINECTOMY/DECOMPRESSION MICRODISCECTOMY 1 LEVEL;  Surgeon: [name], MD;  Location: [facility];  Service: Neurosurgery;  Laterality: Left;  Left Lumbar four-five Laminectomy/Diskectomy/Far lateral diskectomy  . Cryoablation  07-10-12    "tumor on cyst on  my kidney; had an ablation"  . Skin cancer excision  "several"    "back of my neck; right eye; clavicle; nose"  . Inguinal hernia repair Right   . Back surgery    . Subdural hematoma evacuation via craniotomy  2006  . Kidney surgery      There were no vitals filed for this visit.  Visit Diagnosis: Dysarthria  Dysphagia      Subjective Assessment - 02/22/15 1106    Subjective "I have my appointment with Dr. Rexene Alberts tomorrow." "I drove the wrong car today - my gum is in my other car." Pt reports he has used gum with success.   Currently in Pain? No/denies               ADULT SLP TREATMENT - 02/22/15 1107    General Information   Behavior/Cognition Alert;Cooperative;Pleasant mood   Treatment Provided   Treatment provided Cognitive-Linquistic   Dysphagia Treatment   Other treatment/comments SLP provided pt handout of s/s aspiration PNA, and SLP explained each symptom for pt. SLP then asked pt to provide 3 signs and symptoms and he did so with indpeendence.   Pain Assessment   Pain Assessment No/denies pain   Cognitive-Linquistic Treatment   Treatment focused on Dysarthria   Skilled Treatment Pt's loud "Hey!" 10 reps with WNL voicing  8/10. "([name]) thinks I'm doing very well with the speech therapy." Pt with intermittent awareness of hydrophonic voice; when pt clears and swallows voice is clear for approx 10 seconds. Pt maintained 70dB in 19 minutes conversation (simple/mod complex) .    Assessment / Recommendations / Plan   Plan Continue with current plan of care   Progression Toward Goals   Progression toward goals Progressing toward goals          SLP Education - 02/22/15 1139    Education provided Yes   Education Details signs aspiration PNA   Person(s) Educated Patient   Methods Handout;Explanation   Comprehension Verbalized understanding          SLP Short Term Goals - 02/22/15 1132    SLP SHORT TERM GOAL #1   Title pt to maintain 10 reps of oud "hey"  with appropriate voicing over three sessions    Status Achieved   SLP SHORT TERM GOAL #2   Title pt will maintain volume of average 70dB in 12 minutes simple/mod complex conversation over two sessions   Status Achieved   SLP SHORT TERM GOAL #3   Title (if necessary) pt will complete swallowing HEP following possible modified barium swallow exam with rare min A   Time 1   Period Weeks   Status On-going          SLP Long Term Goals - 02/22/15 1138    SLP LONG TERM GOAL #1   Title pt will maintain loud /a/ average 83dB over 3 sessions   Time 5   Period Weeks   Status Revised   SLP LONG TERM GOAL #2   Title pt will engage in mod complex conversation of 10 minutes with average 71dB with rare min A   Status Achieved   SLP LONG TERM GOAL #3   Title (if necessary) pt will complete swallowing HEP with modifed independence   Time 5   Period Weeks   Status On-going   SLP LONG TERM GOAL #4   Title pt will tell SLP 3 s/s aspiration PNA with modified independence   Time --   Period --   Status Achieved          Problem List Patient Active Problem List   Diagnosis Date Noted  . HLD (hyperlipidemia)   . TIA (transient ischemic attack) 08/09/2014  . Chronic kidney disease 08/09/2014  . Atrial fibrillation ([place]) 08/09/2014  . Right renal mass   . Atrial fibrillation with rapid ventricular response ([place]) 07/17/2014  . Elevated troponin 07/17/2014  . Diverticulosis of colon without hemorrhage 06/16/2013  . Post-operative hypothyroidism 06/16/2013  . Renal oncocytoma 06/09/2012  . LBP (low back pain) 06/09/2012  . Parkinson disease, symptomatic ([place]) 06/06/2011  . CARCINOMA, SKIN, SQUAMOUS CELL 12/10/2007  . MALIGNANT NEOPLASM OF THYROID GLAND 12/10/2007  . SUBDURAL HEMATOMA 12/10/2007  . BPH (benign prostatic hypertrophy) 12/10/2007  . Essential hypertension 04/22/2007  . Transient cerebral ischemia 04/22/2007  . Generalized atherosclerosis 04/22/2007  . Osteoarthritis  04/22/2007    [facility] , MS, CCC-SLP  02/22/2015, 11:49 AM  [name] [address] [place], [name], [address] Phone: [phone]   Fax:  [phone]   Name: William [name] MRN: [id number] Date of Birth: 11/09/1928

## 2015-02-22 NOTE — Therapy (Signed)
[facility] [address] [place], [name], [address] Phone: [phone]   Fax:  [phone]  Occupational Therapy Treatment  Patient Details  Name: William [name] MRN: [id number] Date of Birth: 26-Aug-1928 No Data Recorded  Encounter Date: 02/22/2015    Past Medical History  Diagnosis Date  . Hyperlipidemia   . Hx of colonic polyps   . Benign prostatic hypertrophy with urinary obstruction   . TIA (transient ischemic attack)     pt does not recall this hx "but may have had one today" (08/09/2014)  . Subdural hematoma ([place]) 2006  . Gout   . Hypertension     sees [name]  . Thyroid disease     medullary thyroid   . Pneumonia     hx of in 1952  . Parkinson disease ([place])   . A-fib ([place])   . Complication of anesthesia     "difficulty intubation, had to use fiberoptic 2006"  . Difficult intubation   . 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 Primary skin squamous cell carcinoma   . Basal cell carcinoma   . Hypothyroidism   . DJD (degenerative joint disease)   . Arthritis     "right knee" (08/09/2014)  . Gout     "periodically" (08/09/2014)  . Kidney tumor     "tumor on cyst on kidney"   . DJD (degenerative joint disease)   . Parkinson disease [facility])     Past Surgical History  Procedure Laterality Date  . Cataract extraction w/ intraocular lens  implant, bilateral Bilateral ~ 1998  . Cholecystectomy    . Thyroidectomy      medullary carcinoma  . Eye surgery    . Knee arthroscopy Right   . Appendectomy    . Tonsillectomy    . Lumbar laminectomy/decompression microdiscectomy  05/14/2012    Procedure: LUMBAR LAMINECTOMY/DECOMPRESSION MICRODISCECTOMY 1 LEVEL;  Surgeon: [name], MD;  Location: [facility];  Service: Neurosurgery;  Laterality: Left;  Left Lumbar four-five Laminectomy/Diskectomy/Far lateral diskectomy  . Cryoablation  07-10-12    "tumor on cyst on my  kidney; had an ablation"  . Skin cancer excision  "several"    "back of my neck; right eye; clavicle; nose"  . Inguinal hernia repair Right   . Back surgery    . Subdural hematoma evacuation via craniotomy  2006  . Kidney surgery      There were no vitals filed for this visit.  Visit Diagnosis:  No diagnosis found.      Subjective Assessment - 02/22/15 1019    Subjective  I'm happy to report no pain   Pertinent History PD (dx 2009), 07/2014 a-fib, TIA, hx of R craniotomy (11/2004), TIA (2003), thyroid CA, partial onset seizures   Patient Stated Goals improve ADLs and R hand use   Currently in Pain? No/denies        Treatment: Education provided regarding big movements  With ADLs, pt was provided with handout and he verbalized understanding.  Pt practiced donning/ doffing a jacket using big movements x4 following demonstration min v.c. Pt practiced fastening and unfastening buttons using big movements and PWR! Hands prior to task. Functional step and reach with bilateral UE's to place graded clothespins on vertical antennae, min v,c Arm bike x 5 mins, level 1 for conditioning, pt was able to maintain >40 RPM.  OT Short Term Goals - 02/14/15 0817    OT SHORT TERM GOAL #1   Title Pt will be independent with PD-specific HEP.--check STGs 02/08/15   Time 4   Period Weeks   Status Achieved   OT SHORT TERM GOAL #2   Title Pt will be able to write at least 3 sentences with 100% legibility and no significant decrease in size.   Baseline 90%, min decr in size   Time 4   Period Weeks   Status On-going  02/14/15  95% with occasional min decr in size   OT SHORT TERM GOAL #3   Title Pt will improve coordination for ADLs as shown by improving time on 9-hole peg test by at least 8sec with RUE.   Baseline 60.44(eval)   Time 4   Period Weeks   Status Achieved  02/14/15   46.40sec   OT SHORT TERM GOAL #4   Title Pt will improve ability to eat as  shown by completing PPT#2 in 12sec or less   Baseline 16.75 secs on 02/09/15; 13.59sec on 02/14/15   Time 4   Period Weeks   Status On-going   OT SHORT TERM GOAL #5   Title Pt will improve ability to button as shown by ability to button/unbutton 3 buttons with shirt on tabletop in 70sec or less.   Baseline 83.03 checked 02/09/15   Time 4   Period Weeks   Status On-going   OT SHORT TERM GOAL #6   Title Pt will verbalize understanding of ways to prevent future complications and appropriate PD-related community resources.   Time 4   Period Weeks   Status Achieved  02/07/15           OT Long Term Goals - 02/14/15 0824    OT LONG TERM GOAL #1   Title Pt will verbalize understanding of adaptive strategies/AE to incr ease/efficiency with ADLs/IADLs prn.   Time 8   Period Weeks   Status New   OT LONG TERM GOAL #2   Title Pt will be able to carry glass of liquid at least 68feet without spills x2.   Time 8   Period Weeks   Status New   OT LONG TERM GOAL #3   Title Pt will improve coordination for ADLs as shown completing 9-hole peg test in 40 sec or less with RUE.   Baseline 60.44   Time 8   Period Weeks   Status Revised   OT LONG TERM GOAL #4   Title Pt will improve coordination/functional reaching for ADLs as shown by improving score on box and blocks test by at least 6 bilaterally.   Baseline R-31, L-37 blocks   Time 8   Period Weeks   Status New   OT LONG TERM GOAL #5   Title Pt will improve balance/functional reaching for ADLs as shown by improving standing functional reach test to at least 10" with LUE.   Time 8   Period Weeks   Status New   OT LONG TERM GOAL #6   Title Pt will improve ability to button as shown by ability to button/unbutton 3 buttons with shirt on tabletop in 60sec or less.   Baseline 55sec   Time 8   Period Weeks   Status New               Problem List Patient Active Problem List   Diagnosis Date Noted  . HLD (hyperlipidemia)   .  TIA (transient ischemic attack)  08/09/2014  . Chronic kidney disease 08/09/2014  . Atrial fibrillation ([place]) 08/09/2014  . Right renal mass   . Atrial fibrillation with rapid ventricular response ([place]) 07/17/2014  . Elevated troponin 07/17/2014  . Diverticulosis of colon without hemorrhage 06/16/2013  . Post-operative hypothyroidism 06/16/2013  . Renal oncocytoma 06/09/2012  . LBP (low back pain) 06/09/2012  . Parkinson disease, symptomatic ([place]) 06/06/2011  . CARCINOMA, SKIN, SQUAMOUS CELL 12/10/2007  . MALIGNANT NEOPLASM OF THYROID GLAND 12/10/2007  . SUBDURAL HEMATOMA 12/10/2007  . BPH (benign prostatic hypertrophy) 12/10/2007  . Essential hypertension 04/22/2007  . Transient cerebral ischemia 04/22/2007  . Generalized atherosclerosis 04/22/2007  . Osteoarthritis 04/22/2007    [place] Wight 02/22/2015, 11:01 AM Theone Murdoch, OTR/L [phone] Phone: [phone] 6:13 PM 02/22/2015 [facility] [address] [place], [name], [address] Phone: [phone]   Fax:  [phone]  Name: William [name] MRN: [id number] Date of Birth: 1929-03-05

## 2015-02-22 NOTE — Patient Instructions (Signed)

## 2015-02-23 ENCOUNTER — Encounter: Payer: Self-pay | Admitting: Internal Medicine

## 2015-02-23 ENCOUNTER — Telehealth: Payer: Self-pay

## 2015-02-23 ENCOUNTER — Ambulatory Visit (INDEPENDENT_AMBULATORY_CARE_PROVIDER_SITE_OTHER): Payer: Commercial Managed Care - HMO | Admitting: Internal Medicine

## 2015-02-23 ENCOUNTER — Ambulatory Visit (INDEPENDENT_AMBULATORY_CARE_PROVIDER_SITE_OTHER): Payer: Commercial Managed Care - HMO | Admitting: Neurology

## 2015-02-23 ENCOUNTER — Encounter: Payer: Self-pay | Admitting: Neurology

## 2015-02-23 VITALS — BP 108/58 | HR 68 | Resp 16 | Ht 76.0 in | Wt 180.0 lb

## 2015-02-23 VITALS — BP 140/80 | HR 54 | Temp 98.3°F | Wt 181.0 lb

## 2015-02-23 DIAGNOSIS — G20 Parkinson's disease: Secondary | ICD-10-CM

## 2015-02-23 DIAGNOSIS — M25461 Effusion, right knee: Secondary | ICD-10-CM

## 2015-02-23 MED ORDER — RASAGILINE MESYLATE 1 MG PO TABS: 1.0000 mg | ORAL_TABLET | Freq: Every day | ORAL | Status: DC

## 2015-02-23 MED ORDER — CARBIDOPA-LEVODOPA 25-100 MG PO TABS: 1.0000 | ORAL_TABLET | Freq: Four times a day (QID) | ORAL | Status: DC

## 2015-02-23 NOTE — Telephone Encounter (Signed)
pts wife called says the number she has for [place] is Fax: [name]. Support # [phone]. They will be leaving the house in about 15-20 min. Please call cell: [phone]

## 2015-02-23 NOTE — Progress Notes (Signed)
Subjective:    Patient ID: William [name], male    DOB: December 29, 1928, 81 y.o.   MRN: [id number]  HPI Here with wife  Sent here from neurologist due to knee swelling on right Pain for 2 months or so Did have meniscus repair in past---not sure which knee it was  Hasn't stopped playing golf No meds for this  Current Outpatient Prescriptions on File Prior to Visit  Medication Sig Dispense Refill  . apixaban (ELIQUIS) 2.5 MG TABS tablet Take 1 tablet (2.5 mg total) by mouth 2 (two) times daily. 180 tablet 3  . carbidopa-levodopa (SINEMET IR) 25-100 MG tablet Take 1 tablet by mouth 4 (four) times daily. At 8, 12, 5 PM, and 9 or 10 PM 360 tablet 3  . hydrALAZINE (APRESOLINE) 25 MG tablet Take 1 tablet (25 mg total) by mouth 3 (three) times daily. 90 tablet 3  . levothyroxine (SYNTHROID) 100 MCG tablet Take 1 tablet (100 mcg total) by mouth daily before breakfast. 90 tablet 2  . losartan-hydrochlorothiazide (HYZAAR) 100-12.5 MG per tablet Take 1 tablet by mouth daily. 90 tablet 3  . Multiple Vitamin (MULTIVITAMIN WITH MINERALS) TABS Take 1 tablet by mouth daily.    . Probiotic Product (ALIGN) 4 MG CAPS Take 1 capsule by mouth daily.    . rasagiline (AZILECT) 1 MG TABS tablet Take 1 tablet (1 mg total) by mouth daily. 90 tablet 3  . simvastatin (ZOCOR) 20 MG tablet Take 1 tablet (20 mg total) by mouth at bedtime. 90 tablet 3  . [DISCONTINUED] losartan (COZAAR) 50 MG tablet Take 1 tablet (50 mg total) by mouth daily. 30 tablet 1   No current facility-administered medications on file prior to visit.    No Known Allergies  Past Medical History  Diagnosis Date  . Hyperlipidemia   . Hx of colonic polyps   . Benign prostatic hypertrophy with urinary obstruction   . TIA (transient ischemic attack)     pt does not recall this hx "but may have had one today" (08/09/2014)  . Subdural hematoma ([place]) 2006  . Gout   . Hypertension     sees [name]  . Thyroid disease     medullary  thyroid   . Pneumonia     hx of in 1952  . Parkinson disease ([place])   . A-fib ([place])   . Complication of anesthesia     "difficulty intubation, had to use fiberoptic 2006"  . Difficult intubation   . 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 Primary skin squamous cell carcinoma   . Basal cell carcinoma   . Hypothyroidism   . DJD (degenerative joint disease)   . Arthritis     "right knee" (08/09/2014)  . Gout     "periodically" (08/09/2014)  . Kidney tumor     "tumor on cyst on kidney"   . DJD (degenerative joint disease)   . Parkinson disease [facility])     Past Surgical History  Procedure Laterality Date  . Cataract extraction w/ intraocular lens  implant, bilateral Bilateral ~ 1998  . Cholecystectomy    . Thyroidectomy      medullary carcinoma  . Eye surgery    . Knee arthroscopy Right   . Appendectomy    . Tonsillectomy    . Lumbar laminectomy/decompression microdiscectomy  05/14/2012    Procedure: LUMBAR LAMINECTOMY/DECOMPRESSION MICRODISCECTOMY 1 LEVEL;  Surgeon: [name], MD;  Location: [facility];  Service:  Neurosurgery;  Laterality: Left;  Left Lumbar four-five Laminectomy/Diskectomy/Far lateral diskectomy  . Cryoablation  07-10-12    "tumor on cyst on my kidney; had an ablation"  . Skin cancer excision  "several"    "back of my neck; right eye; clavicle; nose"  . Inguinal hernia repair Right   . Back surgery    . Subdural hematoma evacuation via craniotomy  2006  . Kidney surgery      Family History  Problem Relation Age of Onset  . Stroke Mother   . Alzheimer's disease Brother   . Stroke Father   . Alzheimer's disease Sister     Social History   Social History  . Marital Status: Widowed    Spouse Name: N/A  . Number of Children: 3  . Years of Education: 15   Occupational History  . retired    Social History Main Topics  . Smoking status: Former Smoker -- 0.80 packs/day for 15 years    Types: Cigarettes    Quit date:  04/15/1965  . Smokeless tobacco: Never Used  . Alcohol Use: 6.0 oz/week    0 Standard drinks or equivalent, 10 Glasses of wine per week     Comment: 1 glass of wine every night (6oz glass)  . Drug Use: No  . Sexual Activity: Yes   Other Topics Concern  . Not on file   Social History Narrative   Married.   Lives at home with significant other.   Retried. 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   Enjoys golfing, yard work, spending time with family.        Review of Systems  Feels good Sleeping well      Objective:   Physical Exam  Musculoskeletal:  Seems to have a localized pocket of fluid medial and just above right patella. When supine it does seem to be in the joint--but not a big effusion Some crepitus but fair ROM          Assessment & Plan:

## 2015-02-23 NOTE — Telephone Encounter (Signed)
Left message to advise the patient that [facility] is not a working number. I have not been able to fax his prescriptions in. I asked him to call back with a contact number for [facility], and I need to know which [facility] he [facility].

## 2015-02-23 NOTE — Telephone Encounter (Signed)
I spoke to [facility] and advised her that we have fax Rx. Cell number below is wrong. I was able to reach them at her house.

## 2015-02-23 NOTE — Therapy (Signed)
[facility] [address] [place], [name], [address] Phone: [phone]   Fax:  [phone]  Physical Therapy Treatment  Patient Details  Name: William [name] MRN: [id number] Date of Birth: 1929-04-03 No Data Recorded  Encounter Date: 02/22/2015      PT End of Session - 02/23/15 1603    Visit Number 10   Number of Visits 13   Date for PT Re-Evaluation 03/11/15   Authorization Type [employer] H[employer]-G-code every 10th visit   PT Start Time 0932   PT Stop Time 1012   PT Time Calculation (min) 40 min   Equipment Utilized During Treatment Gait belt   Activity Tolerance Patient tolerated treatment well   Behavior During Therapy [facility] for tasks assessed/performed      Past Medical History  Diagnosis Date  . Hyperlipidemia   . Hx of colonic polyps   . Benign prostatic hypertrophy with urinary obstruction   . TIA (transient ischemic attack)     pt does not recall this hx "but may have had one today" (08/09/2014)  . Subdural hematoma ([place]) 2006  . Gout   . Hypertension     sees [name]  . Thyroid disease     medullary thyroid   . Pneumonia     hx of in 1952  . Parkinson disease ([place])   . A-fib ([place])   . Complication of anesthesia     "difficulty intubation, had to use fiberoptic 2006"  . Difficult intubation   . 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 Primary skin squamous cell carcinoma   . Basal cell carcinoma   . Hypothyroidism   . DJD (degenerative joint disease)   . Arthritis     "right knee" (08/09/2014)  . Gout     "periodically" (08/09/2014)  . Kidney tumor     "tumor on cyst on kidney"   . DJD (degenerative joint disease)   . Parkinson disease [facility])     Past Surgical History  Procedure Laterality Date  . Cataract extraction w/ intraocular lens  implant, bilateral Bilateral ~ 1998  . Cholecystectomy    . Thyroidectomy      medullary carcinoma  . Eye surgery     . Knee arthroscopy Right   . Appendectomy    . Tonsillectomy    . Lumbar laminectomy/decompression microdiscectomy  05/14/2012    Procedure: LUMBAR LAMINECTOMY/DECOMPRESSION MICRODISCECTOMY 1 LEVEL;  Surgeon: [name], MD;  Location: [facility];  Service: Neurosurgery;  Laterality: Left;  Left Lumbar four-five Laminectomy/Diskectomy/Far lateral diskectomy  . Cryoablation  07-10-12    "tumor on cyst on my kidney; had an ablation"  . Skin cancer excision  "several"    "back of my neck; right eye; clavicle; nose"  . Inguinal hernia repair Right   . Back surgery    . Subdural hematoma evacuation via craniotomy  2006  . Kidney surgery      There were no vitals filed for this visit.  Visit Diagnosis:  Abnormality of gait  Bradykinesia      Subjective Assessment - 02/22/15 0935    Subjective Went to family reunion-was a nice get together.  Haven't been able to do my exercises since before the weekend.   Currently in Pain? No/denies                         [facility] Adult PT Treatment/Exercise - 02/22/15 0315  Transfers   Transfers Sit to Stand;Stand to Sit   Sit to Stand 6: Modified independent (Device/Increase time);Without upper extremity assist;From chair/3-in-1   Five time sit to stand comments  11.36   Stand to Sit 6: Modified independent (Device/Increase time);Without upper extremity assist;To chair/3-in-1   Number of Reps 10 reps  18 inch surface no UE support   Ambulation/Gait   Ambulation/Gait Yes   Ambulation/Gait Assistance 7: Independent   Ambulation/Gait Assistance Details Gait activities included environmental scanning looking at cards, ball toss and catch, and trunk rotation to give and receive ball while walking.  No overt loss of balance noted.  Pt has one episode of veering to the left with gait.   Ambulation Distance (Feet) 800 Feet   Assistive device None   Gait Pattern Step-through pattern   Ambulation Surface Level;Indoor   Timed Up  and Go Test   TUG Normal TUG;Cognitive TUG   Normal TUG (seconds) 12.4   Cognitive TUG (seconds) 11.7   High Level Balance   High Level Balance Comments Balance activities on incline/decline-marching in place, forward kicks, head turns/head nods x 8 reps, eyes closed; then side step up and down ramp, 4 reps each leg leading.  Step up/up-down/down 6 inch step and ramp, x 10 reps each leg leading with supervision for improved dynamic balance.          Self Care:  Outlined optimal fitness program, including sample exercise chart for preparations for continued fitness upon D/C from PT.      PT Education - 02/23/15 1602    Education provided Yes   Education Details optimal community fitness upon D/C from PT-see pt instructions   Person(s) Educated Patient   Methods Explanation;Handout   Comprehension Verbalized understanding          PT Short Term Goals - 02/07/15 1307    PT SHORT TERM GOAL #1   Title Pt will perform HEP with supervision for improved functional mobility, transfers, balance and gait.  TARGET 02/09/15   Time 4   Period Weeks   Status Partially Met   PT SHORT TERM GOAL #2   Title Pt will improve 5x sit<>stand to less than or equal to 15 seconds for improved efficiency and safety with transfers.   Time 4   Period Weeks   Status Achieved   PT SHORT TERM GOAL #3   Title Pt will improve TUG cognitive to less than or equal to 15 seconds for decreased fall risk/improved dual tasking with gait.   Time 4   Period Weeks   Status Partially Met   PT SHORT TERM GOAL #4   Title Pt will verbalize understanding of local Parkinson's resources.   Time 4   Period Weeks   Status Partially Met           PT Long Term Goals - 02/23/15 1606    PT LONG TERM GOAL #1   Title Pt will verbalize understanding of fall prevention within the home environment.  TARGET 03/11/15   Time 6   Period Weeks   Status On-going   PT LONG TERM GOAL #2   Title Pt will perform at least 8 of  10 reps of sit<>stand transfers from 18 inch surfaces or lower, with minimal to no UE support, for improved safety and efficiency with transfers.   Time 6   Period Weeks   Status On-going   PT LONG TERM GOAL #3   Title Pt will improve Functional Gait Assessment to at  least 19/30 for decreased fall risk.   Time 6   Period Weeks   Status On-going   PT LONG TERM GOAL #4   Title Pt will verbalize plans for continued community fitness upon D/C from PT.   Time 6   Period Weeks   Status On-going               Plan - 02/23/15 1606    Clinical Impression Statement Measurements taken for g-code today; pt is improving functional mobility with 5x sit<>stand and with TUG scores.  Overall, pt is demonstrating improved gait patterns.  Pt will continue to benefit from further skilled PT to address balance, transfers and gait.   Pt will benefit from skilled therapeutic intervention in order to improve on the following deficits Abnormal gait;Decreased balance;Decreased mobility;Decreased strength;Difficulty walking;Postural dysfunction   Rehab Potential Good   PT Frequency 2x / week   PT Duration 6 weeks  wk 4 of 6   PT Treatment/Interventions ADLs/Self Care Home Management;Therapeutic activities;Therapeutic exercise;Functional mobility training;Gait training;Balance training;Neuromuscular re-education;Patient/family education   PT Next Visit Plan Begin checking goals, with plans for discharge next week   PT Home Exercise Plan PWR! HEP   Consulted and Agree with Plan of Care Patient          G-Codes - 02/23/15 1607    Functional Assessment Tool Used TUG cognitive 11.7 seconds, TUG 12.5 seconds, 5x sit<>stand 11.36 sec   Functional Limitation Mobility: Walking and moving around   Mobility: Walking and Moving Around Current Status [phone]) At least 1 percent but less than 20 percent impaired, limited or restricted   Mobility: Walking and Moving Around Goal Status [phone]) At least 1 percent but  less than 20 percent impaired, limited or restricted      Problem List Patient Active Problem List   Diagnosis Date Noted  . HLD (hyperlipidemia)   . TIA (transient ischemic attack) 08/09/2014  . Chronic kidney disease 08/09/2014  . Atrial fibrillation ([place]) 08/09/2014  . Right renal mass   . Atrial fibrillation with rapid ventricular response ([place]) 07/17/2014  . Elevated troponin 07/17/2014  . Diverticulosis of colon without hemorrhage 06/16/2013  . Post-operative hypothyroidism 06/16/2013  . Renal oncocytoma 06/09/2012  . LBP (low back pain) 06/09/2012  . Parkinson disease, symptomatic ([place]) 06/06/2011  . CARCINOMA, SKIN, SQUAMOUS CELL 12/10/2007  . MALIGNANT NEOPLASM OF THYROID GLAND 12/10/2007  . SUBDURAL HEMATOMA 12/10/2007  . BPH (benign prostatic hypertrophy) 12/10/2007  . Essential hypertension 04/22/2007  . Transient cerebral ischemia 04/22/2007  . Generalized atherosclerosis 04/22/2007  . Osteoarthritis 04/22/2007    Shanira Tine W. 02/23/2015, 4:09 PM  Frazier Butt., PT  [facility] [address] [place], [name], [address] Phone: [phone]   Fax:  [phone]  Name: William [name] MRN: [id number] Date of Birth: May 08, 1928  Physical Therapy Progress Note  Dates of Reporting Period: 01/10/15 to 2015-02-23  Objective Reports of Subjective Statement: Pt reports noting improvement in walking, more awareness with gait and posture.  Used TUG, TUG cognitive and 5x sit<>stand measures for G-code.  Objective Measurements: TUG 12.4 sec, TUG cognitive 11.7 sec, 5x sit<>stand 11.36 sec, all improved since eval and STG check.  Goal Update: See goal check above-pt is on target to meet LTGs.  Plan: Begin checking goal and plan for discharge in next 1-2 visits.  Reason Skilled Services are Required: Pt has made functional progress during the course of therapy and would benefit from 1-2 additional visits for  education  in fall prevention, and updating HEP as needed, and final goal check.  Mady Haagensen, PT 02/23/2015 4:14 PM Phone: [phone] Fax: [phone]

## 2015-02-23 NOTE — Patient Instructions (Addendum)
Let's keep meds the same and keep a 4 month follow up.

## 2015-02-23 NOTE — Patient Instructions (Signed)
Try compression on your knee (when you golf) and then ice it after. Call for an appointment if you worsen and I can draw the fluid out.

## 2015-02-23 NOTE — Progress Notes (Signed)
Subjective:    Patient ID: William [name] is a 81 y.o. male.  HPI     Interim history:   William [name] is an 81-year-old right-handed gentleman with an underlying medical history of right TIA in January 2003, SDH (s/p left craniotomy in August 2006), hypertension, hypothyroidism, thyroid cancer, partial onset seizures, off Dilantin, lumbar spine disease, status post lower back surgery at L4-5 in January 2014, renal tumor, status post kidney tumor ablative surgery in March 2014, who presents for followup consultation of his right-sided predominant [name]. He is accompanied by William [name]. I last saw him on 11/01/2014 at which time he reported a recent diagnosis with A. fib. He was admitted to the hospital in April. I reviewed the hospital records from 07/17/2014 through 07/18/2014. He was started on Xarelto. He was then re-admitted on 08/09/14 to 08/10/14 due to altered mental status and slurring of speech and was suspected to have a TIA. He was seen by neuro in consultation and a baby aspirin was added to Xarelto. He presented back to the ER on 08/18/14. He a CTH wo contrast on  08/18/2014 , which was negative for any acute changes. He was seen by the Neurologist in the emergency room and it was felt that a full TIA workup was not necessary at the time. He had presented with hypertension and some slurring of speech. He was switched from Xarelto to Eliquis, because the [facility].  He endorsed recent stressors, including the recent passing of his younger brother a week prior with advanced Alzheimer's disease.   He had an MRI brain and MRA head on 08/09/14: MRI HEAD: No acute intracranial process, specifically no acute ischemia. Involutional changes. Mild white matter changes suggest chronic small vessel ischemic disease. MRA HEAD: No acute large vessel occlusion or high-grade stenosis. Mid grade stenosis RIGHT P2/3 segment.   I suggested we continue with  Sinemet at 4 times a day. He was advised to continue with Azilect as well. He was advised to drink more water. In the interim, we restarted outpatient physical therapy, occupational therapy and speech therapy.  [name], 02/23/2015: He reports doing okay. Playing golf, regularly, right knee bothers him. Thankfully, he has not fallen. He tries to drink enough water. No new issues with A. fib. He has no new complaints.  Previously:   I saw him on 04/29/2014, at which time he reported doing well overall but he was more fatigued and had some excessive daytime somnolence. Memory was stable. His mood was stable. He noted some blood pressure fluctuations. Sometimes he had a dull headache and sometimes he felt lightheaded. He had some anxiety over his lady friend's health, as she had fallen and broken rib. He continued to walk regularly and played golf. He had no recent falls. I asked him to continue with his medications, Azilect once daily and Sinemet 4 times a day.  I saw him on 10/28/2013, at which time he reported doing well. In particular, had no cognitive complaints, no mood issues, and continued to play golf 3 times a week. He was driving well. I suggested an increase in his levodopa to 1 pill 4 times a day of the 25-100 milligrams strength. He had [facility] and had seen a [facility] neurologist as well.    I saw him on 12/07/2012, at which time I felt that he was doing well on Azilect and levodopa. He called in December 2014 with   problems with blood pressure fluctuations. He was wondering if this came from the Azilect but I did not think it was due to the Azilect per se. I was reluctant to take him off of it.    I first met him on 06/03/2012 and he previously followed by Dr. James Love. He has had primarily right-sided symptoms with regards to his Parkinson's, diagnosed in 2010 with symptoms dating back to late 2009 or early 2010. He had briefly tried Mirapex but was taken off d/t  hypotension. L spine MRI in June 2013 showed renal cysts, degenerative joint disease most prominent at L4-5. He tried acupuncture. His MRI of the lumbar spine showed abnormalities with his kidney with a cyst and a tumor and he had ablative surgery on 07/10/12 and had a FU CT done. He had lower back surgery on 05/14/12.   I saw him back on 09/03/2012 and I suggested starting Azilect. He has been tolerating it well and both he and his girlfriend felt that he did better with it in terms of dexterity and fine motor control. He had some hypotension and lightheadedness and reduced his BP medication. He has been having some issue with gout.     His Past Medical History Is Significant For: Past Medical History  Diagnosis Date  . Hyperlipidemia   . Hx of colonic polyps   . Benign prostatic hypertrophy with urinary obstruction   . TIA (transient ischemic attack)     pt does not recall this hx "but may have had one [name]" (08/09/2014)  . Subdural hematoma (HCC) 2006  . Gout   . Hypertension     sees William [name]  . Thyroid disease     medullary thyroid   . Pneumonia     hx of in 1952  . Parkinson disease (HCC)   . A-fib (HCC)   . Complication of anesthesia     "difficulty intubation, had to use fiberoptic 2006"  . Difficult intubation   . 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 Primary skin squamous cell carcinoma   . Basal cell carcinoma   . Hypothyroidism   . DJD (degenerative joint disease)   . Arthritis     "right knee" (08/09/2014)  . Gout     "periodically" (08/09/2014)  . Kidney tumor     "tumor on cyst on kidney"   . DJD (degenerative joint disease)   . Parkinson disease (HCC)     His Past Surgical History Is Significant For: Past Surgical History  Procedure Laterality Date  . Cataract extraction w/ intraocular lens  implant, bilateral Bilateral ~ 1998  . Cholecystectomy    . Thyroidectomy      medullary carcinoma  . Eye surgery    . Knee  arthroscopy Right   . Appendectomy    . Tonsillectomy    . Lumbar laminectomy/decompression microdiscectomy  05/14/2012    Procedure: LUMBAR LAMINECTOMY/DECOMPRESSION MICRODISCECTOMY 1 LEVEL;  Surgeon: James R Hirsch, MD;  Location: [facility];  Service: Neurosurgery;  Laterality: Left;  Left Lumbar four-five Laminectomy/Diskectomy/Far lateral diskectomy  . Cryoablation  07-10-12    "tumor on cyst on my kidney; had an ablation"  . Skin cancer excision  "several"    "back of my neck; right eye; clavicle; nose"  . Inguinal hernia repair Right   . Back surgery    . Subdural hematoma evacuation via craniotomy  2006  . Kidney surgery      His Family   History Is Significant For: Family History  Problem Relation Age of Onset  . Stroke Mother   . Alzheimer's disease Brother   . Stroke Father   . Alzheimer's disease Sister     His Social History Is Significant For: Social History   Social History  . Marital Status: Widowed    Spouse Name: N/A  . Number of Children: 3  . Years of Education: 15   Occupational History  . retired    Social History Main Topics  . Smoking status: Former Smoker -- 0.80 packs/day for 15 years    Types: Cigarettes    Quit date: 04/15/1965  . Smokeless tobacco: Never Used  . Alcohol Use: 6.0 oz/week    0 Standard drinks or equivalent, 10 Glasses of wine per week     Comment: 1 glass of wine every night (6oz glass)  . Drug Use: No  . Sexual Activity: Yes   Other Topics Concern  . None   Social History Narrative   Married.   Lives at home with significant other.   Retried. 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   Enjoys golfing, yard work, spending time with family.         His Allergies Are:  No Known Allergies:   His Current Medications Are:  Outpatient Encounter Prescriptions as of 02/23/2015  Medication Sig  . apixaban (ELIQUIS) 2.5 MG TABS tablet Take 1 tablet (2.5 mg total) by mouth 2 (two) times daily.  . carbidopa-levodopa (SINEMET  IR) 25-100 MG per tablet Take 1 tablet by mouth 4 (four) times daily. At 8, 12, 5 PM, and 9 or 10 PM  . hydrALAZINE (APRESOLINE) 25 MG tablet Take 1 tablet (25 mg total) by mouth 3 (three) times daily.  Marland Kitchen levothyroxine (SYNTHROID) 100 MCG tablet Take 1 tablet (100 mcg total) by mouth daily before breakfast.  . losartan-hydrochlorothiazide (HYZAAR) 100-12.5 MG per tablet Take 1 tablet by mouth daily.  . Multiple Vitamin (MULTIVITAMIN WITH MINERALS) TABS Take 1 tablet by mouth daily.  . Probiotic Product (ALIGN) 4 MG CAPS Take 1 capsule by mouth daily.  . rasagiline (AZILECT) 1 MG TABS tablet Take 1 tablet (1 mg total) by mouth daily.  . simvastatin (ZOCOR) 20 MG tablet Take 1 tablet (20 mg total) by mouth at bedtime.   No facility-administered encounter medications on file as of 02/23/2015.  :  Review of Systems:  Out of a complete 14 point review of systems, all are reviewed and negative with the exception of these symptoms as listed below:   Review of Systems  Neurological:       Patient states he is doing well. No new concerns. Currently in PT, OT, and ST.     Objective:  Neurologic Exam  Physical Exam Physical Examination:   Filed Vitals:   02/23/15 1105  BP: 108/58  Pulse: 68  Resp: 16   General Examination: The patient is a very pleasant 82 y.o. male in no acute distress. He has no dizziness or lightheadedness upon standing. He is in good spirits [name].  HEENT: He has moderate degree of nuchal rigidity with some decrease in passive range of motion. He reports no neck pain. Face is symmetric with decrease in facial animation. Speech is mildly low in volume but not dysarthric. Hearing is grossly intact. Extraocular tracking is fairly good with mild saccadic breakdown. Pupils are reactive to light. Airway is clear with mild mouth dryness noted.  Chest is clear to auscultation without wheezing, or rhonchi  or crackles noted.  Heart sounds are normal without murmurs, rubs or  gallops.  Abdomen is soft, nontender with normal bowel sounds.  He has no pitting edema in the distal lower extremities, but he has right knee swelling. He has mild tenderness with knee movement. The swelling is in the medial aspect and there seems to be fluid, some crepitation is noted as well. Skin is warm and dry.  Neurologically: Mental status: The patient is awake, alert and oriented in all 4 spheres. His memory, attention, language and knowledge are appropriate. He has no aphasia, agnosia, apraxia or anomia. Cranial nerves are as described under HEENT exam. Motor exam: Normal bulk and strength is noted. He has increased tone in the right upper extremity and to a lesser degree in the right lower extremity. Mild cogwheeling is noted. He has a moderate constant resting tremor in the right upper extremity only. He has no other tremor. He has no significant action or postural tremor [name].  Fine motor skills with finger taps and hand movements are moderately impaired on the right upper extremity and otherwise mildly impaired in the right lower extremity and mildly impaired on the left side. He stands up fairly well from the seated position and his posture is age-appropriate to mildly stooped for age, unchanged . He walks with decreased arm swing on the right and turns fairly well. Balance is preserved. Sensory exam is intact to light touch. Romberg is negative. Reflexes are 1+.  tandem walk is not possible.  Assessment and Plan:   In summary, Mr. Lant is a very pleasant 81-year-old gentleman with right-sided predominant, tremor predominant [name], in its 6th+ year. He has a complex history and is status post lower back surgery at level L4/5 in January 2014, and kidney tumor ablative surgery in March 2014 and recent diagnosis of atrial fibrillation in April 2016 with an admission in early April 2016 as well as TIA diagnosis in late April 2016. He is currently on Eliquis. He has had no  further episodes of A. Fib. He had another ER visit in early May due to hypertension and slurring of speech. Parkinson's symptoms and exam are stable. He has right knee swelling with fluid noted. He is advised to make a follow-up appointment with his primary care physician for this. He may have to get this fluid taken out.  We mutually agreed to continue Sinemet 4 times a day. We will continue with Azilect as well. Down the road, we will consider increasing it to 3 hourly with 5 doses. We also talked about the Duopa pump option which may be consider down the road, but he may not be a great surgical candidate. I renewed his prescriptions for Sinemet and Azilect [name]. [facility]. I suggested a 4 month checkup , sooner if needed. I answered all his questions [name] and he was in agreement. I spent 20 minutes in total face-to-face time with the patient, more than 50% of which was spent in counseling and coordination of care, reviewing test results, reviewing medication and discussing or reviewing the diagnosis of PD, TIA, the prognosis and treatment options.  

## 2015-02-23 NOTE — Assessment & Plan Note (Signed)
This is mild and not bothering him much Offered aspiration but didn't push He would like to hold off Discussed compression while golfing--and ice after Asks about diclofenac gel--probably okay to use very infrequently  Consider aspiration and cortisone injection if worsens

## 2015-02-23 NOTE — Progress Notes (Signed)
Pre visit review using our clinic review tool, if applicable. No additional management support is needed unless otherwise documented below in the visit note. 

## 2015-02-28 ENCOUNTER — Ambulatory Visit: Payer: Commercial Managed Care - HMO | Admitting: Physical Therapy

## 2015-02-28 ENCOUNTER — Ambulatory Visit: Payer: Commercial Managed Care - HMO | Admitting: Occupational Therapy

## 2015-02-28 ENCOUNTER — Ambulatory Visit: Payer: Commercial Managed Care - HMO

## 2015-02-28 DIAGNOSIS — R1310 Dysphagia, unspecified: Secondary | ICD-10-CM

## 2015-02-28 DIAGNOSIS — R269 Unspecified abnormalities of gait and mobility: Secondary | ICD-10-CM | POA: Diagnosis not present

## 2015-02-28 DIAGNOSIS — R279 Unspecified lack of coordination: Secondary | ICD-10-CM

## 2015-02-28 DIAGNOSIS — R29898 Other symptoms and signs involving the musculoskeletal system: Secondary | ICD-10-CM | POA: Diagnosis not present

## 2015-02-28 DIAGNOSIS — R258 Other abnormal involuntary movements: Secondary | ICD-10-CM | POA: Diagnosis not present

## 2015-02-28 DIAGNOSIS — R471 Dysarthria and anarthria: Secondary | ICD-10-CM

## 2015-02-28 DIAGNOSIS — R2681 Unsteadiness on feet: Secondary | ICD-10-CM | POA: Diagnosis not present

## 2015-02-28 NOTE — Therapy (Addendum)
[facility] [address] [place], [name], [address] Phone: [phone]   Fax:  [phone]  Occupational Therapy Treatment  Patient Details  Name: William [name] MRN: [id number] Date of Birth: 10-22-1928 No Data Recorded  Encounter Date: 02/28/2015      OT End of Session - 02/28/15 0856    Visit Number 10   Number of Visits 17   Date for OT Re-Evaluation 03/11/15   Authorization Type [employer], no visit limit, no auth required   Authorization - Visit Number 10   Authorization - Number of Visits 10   OT Start Time [phone]   OT Stop Time 0930   OT Time Calculation (min) 41 min   Activity Tolerance Patient tolerated treatment well   Behavior During Therapy [facility] for tasks assessed/performed      Past Medical History  Diagnosis Date  . Hyperlipidemia   . Hx of colonic polyps   . Benign prostatic hypertrophy with urinary obstruction   . TIA (transient ischemic attack)     pt does not recall this hx "but may have had one today" (08/09/2014)  . Subdural hematoma ([place]) 2006  . Gout   . Hypertension     sees [name]  . Thyroid disease     medullary thyroid   . Pneumonia     hx of in 1952  . Parkinson disease ([place])   . A-fib ([place])   . Complication of anesthesia     "difficulty intubation, had to use fiberoptic 2006"  . Difficult intubation   . 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 Primary skin squamous cell carcinoma   . Basal cell carcinoma   . Hypothyroidism   . DJD (degenerative joint disease)   . Arthritis     "right knee" (08/09/2014)  . Gout     "periodically" (08/09/2014)  . Kidney tumor     "tumor on cyst on kidney"   . DJD (degenerative joint disease)   . Parkinson disease [facility])     Past Surgical History  Procedure Laterality Date  . Cataract extraction w/ intraocular lens  implant, bilateral Bilateral ~ 1998  . Cholecystectomy    . Thyroidectomy     medullary carcinoma  . Eye surgery    . Knee arthroscopy Right   . Appendectomy    . Tonsillectomy    . Lumbar laminectomy/decompression microdiscectomy  05/14/2012    Procedure: LUMBAR LAMINECTOMY/DECOMPRESSION MICRODISCECTOMY 1 LEVEL;  Surgeon: [name], MD;  Location: [facility];  Service: Neurosurgery;  Laterality: Left;  Left Lumbar four-five Laminectomy/Diskectomy/Far lateral diskectomy  . Cryoablation  07-10-12    "tumor on cyst on my kidney; had an ablation"  . Skin cancer excision  "several"    "back of my neck; right eye; clavicle; nose"  . Inguinal hernia repair Right   . Back surgery    . Subdural hematoma evacuation via craniotomy  2006  . Kidney surgery      There were no vitals filed for this visit.  Visit Diagnosis:  Bradykinesia  Rigidity  Lack of coordination      Subjective Assessment - 02/28/15 0854    Subjective  Deneies pain   Pertinent History PD (dx 2009), 07/2014 a-fib, TIA, hx of R craniotomy (11/2004), TIA (2003), thyroid CA, partial onset seizures   Patient Stated Goals improve ADLs and R hand use   Currently in Pain? No/denies  Treatment: Reviewed Karn Cassis! Exercises 10-20 reps each, min v.c. Then pt returned demonstration.  Therapist started checking progress for 10th visit: 9 hole peg test RUE: 43.85 secs, 3 button/unbutton:77.13, 53.31, box/ blocks RUE: 45 blocksLUE 44 blocks,  Pt ambulated carrying a glass of water in right hand and a plate full of marbles in left hand, min v.c.for strategy to keep arm close to body, pt demonstrated 2-3 spills.                        OT Short Term Goals - 02/14/15 0817    OT SHORT TERM GOAL #1   Title Pt will be independent with PD-specific HEP.--check STGs 02/08/15   Time 4   Period Weeks   Status Achieved   OT SHORT TERM GOAL #2   Title Pt will be able to write at least 3 sentences with 100% legibility and no significant decrease in size.   Baseline 90%, min decr  in size   Time 4   Period Weeks   Status On-going  02/14/15  95% with occasional min decr in size   OT SHORT TERM GOAL #3   Title Pt will improve coordination for ADLs as shown by improving time on 9-hole peg test by at least 8sec with RUE.   Baseline 60.44(eval)   Time 4   Period Weeks   Status Achieved  02/14/15   46.40sec   OT SHORT TERM GOAL #4   Title Pt will improve ability to eat as shown by completing PPT#2 in 12sec or less   Baseline 16.75 secs on 02/09/15; 13.59sec on 02/14/15   Time 4   Period Weeks   Status On-going   OT SHORT TERM GOAL #5   Title Pt will improve ability to button as shown by ability to button/unbutton 3 buttons with shirt on tabletop in 70sec or less.   Baseline 83.03 checked 02/09/15   Time 4   Period Weeks   Status On-going   OT SHORT TERM GOAL #6   Title Pt will verbalize understanding of ways to prevent future complications and appropriate PD-related community resources.   Time 4   Period Weeks   Status Achieved  02/07/15           OT Long Term Goals - 02/28/15 1200    OT LONG TERM GOAL #1   Title Pt will verbalize understanding of adaptive strategies/AE to incr ease/efficiency with ADLs/IADLs prn.   Time 8   Period Weeks   Status On-going   OT LONG TERM GOAL #2   Title Pt will be able to carry glass of liquid at least 74feet without spills x2.   Time 8   Period Weeks   Status On-going   OT LONG TERM GOAL #3   Title Pt will improve coordination for ADLs as shown completing 9-hole peg test in 40 sec or less with RUE.   Baseline ongoing-  RUE 43.85 secs on 02/28/15   Time 8   Period Weeks   Status Revised   OT LONG TERM GOAL #4   Title Pt will improve coordination/functional reaching for ADLs as shown by improving score on box and blocks test by at least 6 bilaterally.   Baseline box/ blocks RUE: 45 blocksLUE 44 blocks,    Time 8   Period Weeks   Status Achieved   OT LONG TERM GOAL #5   Title Pt will improve balance/functional  reaching for ADLs as shown by  improving standing functional reach test to at least 10" with LUE.   Time 8   Period Weeks   Status New   OT LONG TERM GOAL #6   Title Pt will improve ability to button as shown by ability to button/unbutton 3 buttons with shirt on tabletop in 60sec or less.   Baseline ongoing- 77.13, 53.31   Time 8   Period Weeks   Status On-going               Plan - 03-07-2015 1206    Clinical Impression Statement Pt is progressing towards goals. He made good improvements bilaterally on box/ blocks test.   Pt will benefit from skilled therapeutic intervention in order to improve on the following deficits (Retired) Decreased mobility;Impaired UE functional use;Decreased knowledge of use of DME;Decreased balance;Decreased activity tolerance;Impaired tone;Decreased coordination   Rehab Potential Good   OT Frequency 2x / week   OT Duration 8 weeks   OT Treatment/Interventions Self-care/ADL training;Cryotherapy;Moist Heat;Passive range of motion;Fluidtherapy;Therapeutic activities;DME and/or AE instruction;Therapeutic exercises;Splinting;Manual Therapy;Neuromuscular education;Ultrasound;Therapeutic exercise;Functional Mobility Training;Patient/family education   Plan check goals, plan d/c next week ,carrying objects, fastening buttons,   OT Home Exercise Plan issued: seated PWR!; 01/31/15 PWR! Moves in supine, ways to prevent future complications, community resources; coordination HEP; 02/07/15 PWR! moves in quadraped; 02/16/15 PWR! hands (basic 4)   Consulted and Agree with Plan of Care Patient          G-Codes - 03-07-2015 0905    Functional Assessment Tool Used 9 hole peg test RUE: 43.85 secs, 3 button/unbutton:77.13, 53.31, box/ blocks RUE: 42 blocksLUE 44 blocks,    Functional Limitation Carrying, moving and handling objects   Carrying, Moving and Handling Objects Current Status [id number]) At least 20 percent but less than 40 percent impaired, limited or restricted    Carrying, Moving and Handling Objects Goal Status [id number]) At least 20 percent but less than 40 percent impaired, limited or restricted      Problem List Patient Active Problem List   Diagnosis Date Noted  . Effusion of right knee 02/23/2015  . HLD (hyperlipidemia)   . TIA (transient ischemic attack) 08/09/2014  . Chronic kidney disease 08/09/2014  . Atrial fibrillation ([place]) 08/09/2014  . Right renal mass   . Atrial fibrillation with rapid ventricular response ([place]) 07/17/2014  . Elevated troponin 07/17/2014  . Diverticulosis of colon without hemorrhage 06/16/2013  . Post-operative hypothyroidism 06/16/2013  . Renal oncocytoma 06/09/2012  . LBP (low back pain) 06/09/2012  . Parkinson disease, symptomatic ([place]) 06/06/2011  . CARCINOMA, SKIN, SQUAMOUS CELL 12/10/2007  . MALIGNANT NEOPLASM OF THYROID GLAND 12/10/2007  . SUBDURAL HEMATOMA 12/10/2007  . BPH (benign prostatic hypertrophy) 12/10/2007  . Essential hypertension 04/22/2007  . Transient cerebral ischemia 04/22/2007  . Generalized atherosclerosis 04/22/2007  . Osteoarthritis 04/22/2007   Occupational Therapy Progress Note  Dates of Reporting Period:01/10/15  to 03-07-2015  Objective Reports of Subjective Statement: 9 hole peg test RUE: 43.85 secs, 3 button/unbutton:77.13, 53.31, box/ blocks RUE: 45 blocksLUE 44 blocks,     Objective Measurements: see above  Goal Update: see above  Plan: Pt can benefit from skilled occupational therapy to maximize safety and independence with ADLs and reinforce large amplitude movements with functional tasks  Reason Skilled Services are Required: Pt continues to demonstrate bradykinesia, decreased coordination and abnormal posture. RINE,KATHRYN 03-07-2015, 12:08 PM Theone Murdoch, OTR/L [phone] Phone: [phone] 12:08 PM Nov 22, 2016[facility] [address] [place], [name], [address] Phone: [phone]  Fax:   [phone]  Name: William [name] MRN: [id number] Date of Birth: 1928-10-03

## 2015-02-28 NOTE — Patient Instructions (Signed)
Fall Prevention in the Home  Falls can cause injuries and can affect people from all age groups. There are many simple things that you can do to make your home safe and to help prevent falls. WHAT CAN I DO ON THE OUTSIDE OF MY HOME? 1. Regularly repair the edges of walkways and driveways and fix any cracks. 2. Remove high doorway thresholds. 3. Trim any shrubbery on the main path into your home. 4. Use bright outdoor lighting. 5. Clear walkways of debris and clutter, including tools and rocks. 6. Regularly check that handrails are securely fastened and in good repair. Both sides of any steps should have handrails. 7. Install guardrails along the edges of any raised decks or porches. 8. Have leaves, snow, and ice cleared regularly. 9. Use sand or salt on walkways during winter months. 10. In the garage, clean up any spills right away, including grease or oil spills. WHAT CAN I DO IN THE BATHROOM?  Use night lights.  Install grab bars by the toilet and in the tub and shower. Do not use towel bars as grab bars.  Use non-skid mats or decals on the floor of the tub or shower.  If you need to sit down while you are in the shower, use a plastic, non-slip stool.Marland Kitchen  Keep the floor dry. Immediately clean up any water that spills on the floor.  Remove soap buildup in the tub or shower on a regular basis.  Attach bath mats securely with double-sided non-slip rug tape.  Remove throw rugs and other tripping hazards from the floor. WHAT CAN I DO IN THE BEDROOM?  Use night lights.  Make sure that a bedside light is easy to reach.  Do not use oversized bedding that drapes onto the floor.  Have a firm chair that has side arms to use for getting dressed.  Remove throw rugs and other tripping hazards from the floor. WHAT CAN I DO IN THE KITCHEN?   Clean up any spills right away.  Avoid walking on wet floors.  Place frequently used items in easy-to-reach places.  If you need to reach for  something above you, use a sturdy step stool that has a grab bar.  Keep electrical cables out of the way.  Do not use floor polish or wax that makes floors slippery. If you have to use wax, make sure that it is non-skid floor wax.  Remove throw rugs and other tripping hazards from the floor. WHAT CAN I DO IN THE STAIRWAYS?  Do not leave any items on the stairs.  Make sure that there are handrails on both sides of the stairs. Fix handrails that are broken or loose. Make sure that handrails are as long as the stairways.  Check any carpeting to make sure that it is firmly attached to the stairs. Fix any carpet that is loose or worn.  Avoid having throw rugs at the top or bottom of stairways, or secure the rugs with carpet tape to prevent them from moving.  Make sure that you have a light switch at the top of the stairs and the bottom of the stairs. If you do not have them, have them installed. WHAT ARE SOME OTHER FALL PREVENTION TIPS?  Wear closed-toe shoes that fit well and support your feet. Wear shoes that have rubber soles or low heels.  When you use a stepladder, make sure that it is completely opened and that the sides are firmly locked. Have someone hold the ladder while you  are using it. Do not climb a closed stepladder.  Add color or contrast paint or tape to grab bars and handrails in your home. Place contrasting color strips on the first and last steps.  Use mobility aids as needed, such as canes, walkers, scooters, and crutches.  Turn on lights if it is dark. Replace any light bulbs that burn out.  Set up furniture so that there are clear paths. Keep the furniture in the same spot.  Fix any uneven floor surfaces.  Choose a carpet design that does not hide the edge of steps of a stairway.  Be aware of any and all pets.  Review your medicines with your healthcare provider. Some medicines can cause dizziness or changes in blood pressure, which increase your risk of  falling. Talk with your health care provider about other ways that you can decrease your risk of falls. This may include working with a physical therapist or trainer to improve your strength, balance, and endurance.   This information is not intended to replace advice given to you by your health care provider. Make sure you discuss any questions you have with your health care provider.   Document Released: 03/22/2002 Document Revised: 08/16/2014 Document Reviewed: 05/06/2014 Elsevier Interactive Patient Education [employer].   It is important to avoid accidents which may result in broken bones.  Here are a few ideas on how to make your home safer so you will be less likely to trip or fall.  11. Use nonskid mats or non slip strips in your shower or tub, on your bathroom floor and around sinks.  If you know that you have spilled water, wipe it up! 12. In the bathroom, it is important to have properly installed grab bars on the walls or on the edge of the tub.  Towel racks are NOT strong enough for you to hold onto or to pull on for support. 13. Stairs and hallways should have enough light.  Add lamps or night lights if you need ore light. 14. It is good to have handrails on both sides of the stairs if possible.  Always fix broken handrails right away. 15. It is important to see the edges of steps.  Paint the edges of outdoor steps white so you can see them better.  Put colored tape on the edge of inside steps. 16. Throw-rugs are dangerous because they can slide.  Removing the rugs is the best idea, but if they must stay, add adhesive carpet tape to prevent slipping. 17. Do not keep things on stairs or in the halls.  Remove small furniture that blocks the halls as it may cause you to trip.  Keep telephone and electrical cords out of the way where you walk. 18. Always were sturdy, rubber-soled shoes for good support.  Never wear just socks, especially on the stairs.  Socks may cause you to slip  or fall.  Do not wear full-length housecoats as you can easily trip on the bottom.  19. Place the things you use the most on the shelves that are the easiest to reach.  If you use a stepstool, make sure it is in good condition.  If you feel unsteady, DO NOT climb, ask for help. 20. If a health professional advises you to use a cane or walker, do not be ashamed.  These items can keep you from falling and breaking your bones.

## 2015-02-28 NOTE — Therapy (Signed)
[facility] [address] [place], [name], [address] Phone: [phone]   Fax:  [phone]  Physical Therapy Treatment  Patient Details  Name: William [name] MRN: [id number] Date of Birth: 02-Oct-1928 No Data Recorded  Encounter Date: 02/28/2015      PT End of Session - 02/28/15 1336    Visit Number 11   Number of Visits 13   Date for PT Re-Evaluation 03/11/15   Authorization Type [employer] H[employer]-G-code every 10th visit   PT Start Time 1019   PT Stop Time 1102   PT Time Calculation (min) 43 min   Equipment Utilized During Treatment Gait belt   Activity Tolerance Patient tolerated treatment well   Behavior During Therapy [facility] for tasks assessed/performed      Past Medical History  Diagnosis Date  . Hyperlipidemia   . Hx of colonic polyps   . Benign prostatic hypertrophy with urinary obstruction   . TIA (transient ischemic attack)     pt does not recall this hx "but may have had one today" (08/09/2014)  . Subdural hematoma ([place]) 2006  . Gout   . Hypertension     sees [name]  . Thyroid disease     medullary thyroid   . Pneumonia     hx of in 1952  . Parkinson disease ([place])   . A-fib ([place])   . Complication of anesthesia     "difficulty intubation, had to use fiberoptic 2006"  . Difficult intubation   . 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 Primary skin squamous cell carcinoma   . Basal cell carcinoma   . Hypothyroidism   . DJD (degenerative joint disease)   . Arthritis     "right knee" (08/09/2014)  . Gout     "periodically" (08/09/2014)  . Kidney tumor     "tumor on cyst on kidney"   . DJD (degenerative joint disease)   . Parkinson disease [facility])     Past Surgical History  Procedure Laterality Date  . Cataract extraction w/ intraocular lens  implant, bilateral Bilateral ~ 1998  . Cholecystectomy    . Thyroidectomy      medullary carcinoma  . Eye surgery     . Knee arthroscopy Right   . Appendectomy    . Tonsillectomy    . Lumbar laminectomy/decompression microdiscectomy  05/14/2012    Procedure: LUMBAR LAMINECTOMY/DECOMPRESSION MICRODISCECTOMY 1 LEVEL;  Surgeon: [name], MD;  Location: [facility];  Service: Neurosurgery;  Laterality: Left;  Left Lumbar four-five Laminectomy/Diskectomy/Far lateral diskectomy  . Cryoablation  07-10-12    "tumor on cyst on my kidney; had an ablation"  . Skin cancer excision  "several"    "back of my neck; right eye; clavicle; nose"  . Inguinal hernia repair Right   . Back surgery    . Subdural hematoma evacuation via craniotomy  2006  . Kidney surgery      There were no vitals filed for this visit.  Visit Diagnosis:  Abnormality of gait      Subjective Assessment - 02/28/15 1028    Subjective MD noticed swelling in L knee and sent to primary care md who wanted pt to try compression sleeve for L knee before recommending aspiration.   Patient Stated Goals Pt's goal for therapy is to work on balance and stamina.   Currently in Pain? No/denies  Functional Gait  Assessment   Gait assessed  Yes   Gait Level Surface Walks 20 ft in less than 7 sec but greater than 5.5 sec, uses assistive device, slower speed, mild gait deviations, or deviates 6-10 in outside of the 12 in walkway width.   Change in Gait Speed Able to smoothly change walking speed without loss of balance or gait deviation. Deviate no more than 6 in outside of the 12 in walkway width.   Gait with Horizontal Head Turns Performs head turns smoothly with no change in gait. Deviates no more than 6 in outside 12 in walkway width   Gait with Vertical Head Turns Performs head turns with no change in gait. Deviates no more than 6 in outside 12 in walkway width.   Gait and Pivot Turn Pivot turns safely within 3 sec and stops quickly with no loss of balance.   Step Over Obstacle Is able to step over one shoe box (4.5 in total  height) without changing gait speed. No evidence of imbalance.   Gait with Narrow Base of Support Ambulates less than 4 steps heel to toe or cannot perform without assistance.   Gait with Eyes Closed Cannot walk 20 ft without assistance, severe gait deviations or imbalance, deviates greater than 15 in outside 12 in walkway width or will not attempt task.   Ambulating Backwards Walks 20 ft, uses assistive device, slower speed, mild gait deviations, deviates 6-10 in outside 12 in walkway width.   Steps Alternating feet, no rail.   Total Score 21                     [facility] Adult PT Treatment/Exercise - 02/28/15 0001    Transfers   Number of Reps Other reps (comment)  9 out of 10 reps out of 16 1/2" chair without UE support   Standardized Balance Assessment   Standardized Balance Assessment Timed Up and Go Test   Timed Up and Go Test   TUG Normal TUG;Cognitive TUG   Normal TUG (seconds) 9.56   Cognitive TUG (seconds) 9.84  three trials-pt quit counting                PT Education - 02/28/15 1335    Education provided Yes   Education Details Fall prevention, progress toward goals   Person(s) Educated Patient   Methods Explanation;Handout   Comprehension Verbalized understanding          PT Short Term Goals - 02/07/15 1307    PT SHORT TERM GOAL #1   Title Pt will perform HEP with supervision for improved functional mobility, transfers, balance and gait.  TARGET 02/09/15   Time 4   Period Weeks   Status Partially Met   PT SHORT TERM GOAL #2   Title Pt will improve 5x sit<>stand to less than or equal to 15 seconds for improved efficiency and safety with transfers.   Time 4   Period Weeks   Status Achieved   PT SHORT TERM GOAL #3   Title Pt will improve TUG cognitive to less than or equal to 15 seconds for decreased fall risk/improved dual tasking with gait.   Time 4   Period Weeks   Status Partially Met   PT SHORT TERM GOAL #4   Title Pt will verbalize  understanding of local Parkinson's resources.   Time 4   Period Weeks   Status Partially Met           PT Long  Term Goals - 02/28/15 1038    PT LONG TERM GOAL #1   Title Pt will verbalize understanding of fall prevention within the home environment.  TARGET 03/11/15   Time 6   Period Weeks   Status Achieved   PT LONG TERM GOAL #2   Title Pt will perform at least 8 of 10 reps of sit<>stand transfers from 18 inch surfaces or lower, with minimal to no UE support, for improved safety and efficiency with transfers.   Time 6   Period Weeks   Status Achieved   PT LONG TERM GOAL #3   Title Pt will improve Functional Gait Assessment to at least 19/30 for decreased fall risk.   Time 6   Period Weeks   Status Achieved   PT LONG TERM GOAL #4   Title Pt will verbalize plans for continued community fitness upon D/C from PT.   Time 6   Period Weeks   Status On-going               Plan - 02/28/15 1337    Clinical Impression Statement Pt met LTG's 1,2, and 3.  Plan to finish checking goals next session and d/c per Mady Haagensen, PT.   Pt will benefit from skilled therapeutic intervention in order to improve on the following deficits Abnormal gait;Decreased balance;Decreased mobility;Decreased strength;Difficulty walking;Postural dysfunction   Rehab Potential Good   PT Frequency 2x / week   PT Duration 6 weeks  wk 4 of 6   PT Treatment/Interventions ADLs/Self Care Home Management;Therapeutic activities;Therapeutic exercise;Functional mobility training;Gait training;Balance training;Neuromuscular re-education;Patient/family education   PT Next Visit Plan Finish checking goals and d/c.   PT Home Exercise Plan PWR! HEP   Consulted and Agree with Plan of Care Patient        Problem List Patient Active Problem List   Diagnosis Date Noted  . Effusion of right knee 02/23/2015  . HLD (hyperlipidemia)   . TIA (transient ischemic attack) 08/09/2014  . Chronic kidney disease  08/09/2014  . Atrial fibrillation ([place]) 08/09/2014  . Right renal mass   . Atrial fibrillation with rapid ventricular response ([place]) 07/17/2014  . Elevated troponin 07/17/2014  . Diverticulosis of colon without hemorrhage 06/16/2013  . Post-operative hypothyroidism 06/16/2013  . Renal oncocytoma 06/09/2012  . LBP (low back pain) 06/09/2012  . Parkinson disease, symptomatic ([place]) 06/06/2011  . CARCINOMA, SKIN, SQUAMOUS CELL 12/10/2007  . MALIGNANT NEOPLASM OF THYROID GLAND 12/10/2007  . SUBDURAL HEMATOMA 12/10/2007  . BPH (benign prostatic hypertrophy) 12/10/2007  . Essential hypertension 04/22/2007  . Transient cerebral ischemia 04/22/2007  . Generalized atherosclerosis 04/22/2007  . Osteoarthritis 04/22/2007    Narda Bonds 02/28/2015, 1:39 PM  [facility] [address] [place], [name], [address] Phone: [phone]   Fax:  [phone]  Name: William [name] MRN: [id number] Date of Birth: 06/17/1928    Narda Bonds, PTA [place] 02/28/2015 1:39 PM Phone: [phone] Fax: [phone]

## 2015-02-28 NOTE — Therapy (Signed)
[facility] [address] [place], [name], [address] Phone: [phone]   Fax:  [phone]  Speech Language Pathology Treatment  Patient Details  Name: William [name] MRN: [id number] Date of Birth: 1929-02-07 No Data Recorded  Encounter Date: 02/28/2015      End of Session - 02/28/15 1010    Visit Number 8   Number of Visits 17   Date for SLP Re-Evaluation 03/11/15   SLP Start Time 0936   SLP Stop Time  1015   SLP Time Calculation (min) 39 min   Activity Tolerance Patient tolerated treatment well      Past Medical History  Diagnosis Date  . Hyperlipidemia   . Hx of colonic polyps   . Benign prostatic hypertrophy with urinary obstruction   . TIA (transient ischemic attack)     pt does not recall this hx "but may have had one today" (08/09/2014)  . Subdural hematoma ([place]) 2006  . Gout   . Hypertension     sees [name]  . Thyroid disease     medullary thyroid   . Pneumonia     hx of in 1952  . Parkinson disease ([place])   . A-fib ([place])   . Complication of anesthesia     "difficulty intubation, had to use fiberoptic 2006"  . Difficult intubation   . 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 Primary skin squamous cell carcinoma   . Basal cell carcinoma   . Hypothyroidism   . DJD (degenerative joint disease)   . Arthritis     "right knee" (08/09/2014)  . Gout     "periodically" (08/09/2014)  . Kidney tumor     "tumor on cyst on kidney"   . DJD (degenerative joint disease)   . Parkinson disease [facility])     Past Surgical History  Procedure Laterality Date  . Cataract extraction w/ intraocular lens  implant, bilateral Bilateral ~ 1998  . Cholecystectomy    . Thyroidectomy      medullary carcinoma  . Eye surgery    . Knee arthroscopy Right   . Appendectomy    . Tonsillectomy    . Lumbar laminectomy/decompression microdiscectomy  05/14/2012    Procedure: LUMBAR  LAMINECTOMY/DECOMPRESSION MICRODISCECTOMY 1 LEVEL;  Surgeon: [name], MD;  Location: [facility];  Service: Neurosurgery;  Laterality: Left;  Left Lumbar four-five Laminectomy/Diskectomy/Far lateral diskectomy  . Cryoablation  07-10-12    "tumor on cyst on my kidney; had an ablation"  . Skin cancer excision  "several"    "back of my neck; right eye; clavicle; nose"  . Inguinal hernia repair Right   . Back surgery    . Subdural hematoma evacuation via craniotomy  2006  . Kidney surgery      There were no vitals filed for this visit.  Visit Diagnosis: Dysarthria  Dysphagia      Subjective Assessment - 02/28/15 0938    Subjective Pt in restroom at 0932. Began therapy at [phone].   Currently in Pain? No/denies               ADULT SLP TREATMENT - 02/28/15 0938    General Information   Behavior/Cognition Alert;Cooperative;Pleasant mood   Treatment Provided   Treatment provided Cognitive-Linquistic   Dysphagia Treatment   Other treatment/comments Pt with stick of gum today, with significant reduction in frequency of hydrophonic voice. Swallowing HEP completed today with min A occasionally.  Pain Assessment   Pain Assessment No/denies pain   Cognitive-Linquistic Treatment   Treatment focused on Dysarthria   Skilled Treatment Loud "hey" targeted today to recalibrate pt's voice to closer to WNL.  In 15 minutes conversation pt maintained louder speech with average 70dB, however voice quality cont to suffer. It was less in frequent hydrophonic voice than last week without gum/lozenge, however.    Assessment / Recommendations / Plan   Plan Continue with current plan of care   Progression Toward Goals   Progression toward goals Progressing toward goals            SLP Short Term Goals - 02/28/15 1014    SLP SHORT TERM GOAL #1   Title pt to maintain 10 reps of oud "hey" with appropriate voicing over three sessions    Status Achieved   SLP SHORT TERM GOAL #2   Title  pt will maintain volume of average 70dB in 12 minutes simple/mod complex conversation over two sessions   Status Achieved   SLP SHORT TERM GOAL #3   Title (if necessary) pt will complete swallowing HEP following possible modified barium swallow exam with rare min A   Status Not met                  SLP Long Term Goals - 02/28/15 1015    SLP LONG TERM GOAL #1   Title pt will maintain loud /a/ average 83dB over 3 sessions   Time 4   Period Weeks   Status Revised   SLP LONG TERM GOAL #2   Title pt will engage in mod complex conversation of 10 minutes with average 71dB with rare min A   Status Achieved   SLP LONG TERM GOAL #3   Title (if necessary) pt will complete swallowing HEP with modifed independence   Time 4   Period Weeks   Status On-going   SLP LONG TERM GOAL #4   Title pt will tell SLP 3 s/s aspiration PNA with modified independence   Status Achieved          Plan - 02/28/15 1012    Clinical Impression Statement Pt's speech in conversation of 15 minutes remained above 70dB average. Pt needs to cont skilled ST to shape loud /a/ as well as ensure carryover of louder speech to various speaking situations. He cont to require min cues occasionally with swallowing HEP.   Speech Therapy Frequency 2x / week   Duration 4 weeks   Treatment/Interventions Aspiration precaution training;Pharyngeal strengthening exercises;Oral motor exercises;SLP instruction and feedback;Internal/external aids;Compensatory strategies;Patient/family education;Functional tasks;Cueing hierarchy   Potential to Achieve Goals Good        Problem List Patient Active Problem List   Diagnosis Date Noted  . Effusion of right knee 02/23/2015  . HLD (hyperlipidemia)   . TIA (transient ischemic attack) 08/09/2014  . Chronic kidney disease 08/09/2014  . Atrial fibrillation ([place]) 08/09/2014  . Right renal mass   . Atrial fibrillation with rapid ventricular response ([place]) 07/17/2014  . Elevated troponin  07/17/2014  . Diverticulosis of colon without hemorrhage 06/16/2013  . Post-operative hypothyroidism 06/16/2013  . Renal oncocytoma 06/09/2012  . LBP (low back pain) 06/09/2012  . Parkinson disease, symptomatic ([place]) 06/06/2011  . CARCINOMA, SKIN, SQUAMOUS CELL 12/10/2007  . MALIGNANT NEOPLASM OF THYROID GLAND 12/10/2007  . SUBDURAL HEMATOMA 12/10/2007  . BPH (benign prostatic hypertrophy) 12/10/2007  . Essential hypertension 04/22/2007  . Transient cerebral ischemia 04/22/2007  . Generalized atherosclerosis 04/22/2007  . Osteoarthritis  04/22/2007    [place] , [place], [place] 02/28/2015, 10:20 AM  [facility] [address] [place], [name], [address] Phone: [phone]   Fax:  [phone]   Name: SAIFAN RAYFORD MRN: [id number] Date of Birth: [birth year]

## 2015-03-02 ENCOUNTER — Ambulatory Visit: Payer: Commercial Managed Care - HMO

## 2015-03-02 ENCOUNTER — Ambulatory Visit: Payer: Commercial Managed Care - HMO | Admitting: Occupational Therapy

## 2015-03-02 ENCOUNTER — Ambulatory Visit: Payer: Commercial Managed Care - HMO | Admitting: Physical Therapy

## 2015-03-02 DIAGNOSIS — R1310 Dysphagia, unspecified: Secondary | ICD-10-CM | POA: Diagnosis not present

## 2015-03-02 DIAGNOSIS — R279 Unspecified lack of coordination: Secondary | ICD-10-CM

## 2015-03-02 DIAGNOSIS — R258 Other abnormal involuntary movements: Secondary | ICD-10-CM | POA: Diagnosis not present

## 2015-03-02 DIAGNOSIS — R2681 Unsteadiness on feet: Secondary | ICD-10-CM | POA: Diagnosis not present

## 2015-03-02 DIAGNOSIS — R269 Unspecified abnormalities of gait and mobility: Secondary | ICD-10-CM | POA: Diagnosis not present

## 2015-03-02 DIAGNOSIS — R29898 Other symptoms and signs involving the musculoskeletal system: Secondary | ICD-10-CM

## 2015-03-02 DIAGNOSIS — R471 Dysarthria and anarthria: Secondary | ICD-10-CM

## 2015-03-02 NOTE — Therapy (Signed)
[facility] [address] [place], [name], [address] Phone: [phone]   Fax:  [phone]  Speech Language Pathology Treatment  Patient Details  Name: William [name] MRN: [id number] Date of Birth: [birth year] No Data Recorded  Encounter Date: 03/02/2015      End of Session - 03/02/15 1009    Visit Number 9   Number of Visits 17   Date for SLP Re-Evaluation 03/11/15   SLP Start Time 0936   SLP Stop Time  1006   SLP Time Calculation (min) 30 min      Past Medical History  Diagnosis Date  . Hyperlipidemia   . Hx of colonic polyps   . Benign prostatic hypertrophy with urinary obstruction   . TIA (transient ischemic attack)     pt does not recall this hx "but may have had one today" (08/09/2014)  . Subdural hematoma ([place]) 2006  . Gout   . Hypertension     sees [name]  . Thyroid disease     medullary thyroid   . Pneumonia     hx of in 1952  . Parkinson disease ([place])   . A-fib ([place])   . Complication of anesthesia     "difficulty intubation, had to use fiberoptic 2006"  . Difficult intubation   . 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 Primary skin squamous cell carcinoma   . Basal cell carcinoma   . Hypothyroidism   . DJD (degenerative joint disease)   . Arthritis     "right knee" (08/09/2014)  . Gout     "periodically" (08/09/2014)  . Kidney tumor     "tumor on cyst on kidney"   . DJD (degenerative joint disease)   . Parkinson disease [facility])     Past Surgical History  Procedure Laterality Date  . Cataract extraction w/ intraocular lens  implant, bilateral Bilateral ~ 1998  . Cholecystectomy    . Thyroidectomy      medullary carcinoma  . Eye surgery    . Knee arthroscopy Right   . Appendectomy    . Tonsillectomy    . Lumbar laminectomy/decompression microdiscectomy  05/14/2012    Procedure: LUMBAR LAMINECTOMY/DECOMPRESSION MICRODISCECTOMY 1 LEVEL;   Surgeon: [name], MD;  Location: [facility];  Service: Neurosurgery;  Laterality: Left;  Left Lumbar four-five Laminectomy/Diskectomy/Far lateral diskectomy  . Cryoablation  07-10-12    "tumor on cyst on my kidney; had an ablation"  . Skin cancer excision  "several"    "back of my neck; right eye; clavicle; nose"  . Inguinal hernia repair Right   . Back surgery    . Subdural hematoma evacuation via craniotomy  2006  . Kidney surgery      There were no vitals filed for this visit.  Visit Diagnosis: Dysarthria  Dysphagia      Subjective Assessment - 03/02/15 0944    Subjective "I'm done with OT and with PT today. Do you think I can take a break from speech therapy too?"               ADULT SLP TREATMENT - 03/02/15 0941    General Information   Behavior/Cognition Alert;Cooperative;Pleasant mood   Treatment Provided   Treatment provided Cognitive-Linquistic   Dysphagia Treatment   Other treatment/comments Pt reports doing exercises for swallowing approx x2/day. SLP reiterated x2/day at least. Educated pt on necessity to cont with HEP for swallowing x2/day until Christmas  and then x3/week to maintain any strength gained from HEP.   Pain Assessment   Pain Assessment No/denies pain   Cognitive-Linquistic Treatment   Treatment focused on Dysarthria   Skilled Treatment Loud "hey!" x3 with average 90dB in order to quantify/objectify pt's feeling of louder speech. SLP shaped pt's loud "hey" into loud /a/ and pt able to maintain for approx 8 seconds, with reduced vocal quality for last approx 1 second. SLP reiterated to patient that his vocal quality may likely improve with tighter abdominal push using level 8 effort, as ID'd by pt when doing loud /a/. Loud /a/ average 91dB. Pt maintained average 70dB speech volume in 15 minutes conversation with usual min cues for abdominal push for improving voice quality.   Assessment / Recommendations / Plan   Plan Continue with current  plan of care   Progression Toward Goals   Progression toward goals Progressing toward goals          SLP Education - 05-Mar-2015 1008    Education provided Yes   Education Details Duration of swallow HEP, loud /a/ at level 8 effort   Person(s) Educated Patient   Methods Explanation;Demonstration;Handout   Comprehension Verbalized understanding;Returned demonstration;Verbal cues required          SLP Short Term Goals - 05-Mar-2015 1014    SLP SHORT TERM GOAL #1   Title pt to maintain 10 reps of oud "hey" with appropriate voicing over three sessions    Status Achieved   SLP SHORT TERM GOAL #2   Title pt will maintain volume of average 70dB in 12 minutes simple/mod complex conversation over two sessions   Status Achieved   SLP SHORT TERM GOAL #3   Title (if necessary) pt will complete swallowing HEP following possible modified barium swallow exam with rare min A   Time 1   Period Weeks   Status Not Met          SLP Long Term Goals - 03-05-15 1014    SLP LONG TERM GOAL #1   Title pt will maintain loud /a/ average 83dB over 3 sessions   Period Weeks   Status Partially Met  loud "hey" consistently over 83dB   SLP LONG TERM GOAL #2   Title pt will engage in mod complex conversation of 10 minutes with average 71dB with rare min A   Status Achieved   SLP LONG TERM GOAL #3   Title (if necessary) pt will complete swallowing HEP with modifed independence   Status Partially Met   SLP LONG TERM GOAL #4   Title pt will tell SLP 3 s/s aspiration PNA with modified independence   Status Achieved          Plan - 2015-03-05 1011    Clinical Impression Statement Pt was able to achieve shaping of loud /a/ today. He was educated about duration of HEP as well as frequency change as of Christmas. He maintained volume of 70dB for 15 minutes conversation with cues to improve vocal qualtiy by increasing abdominal push. Pt is satisfied with progress and would like to cont ST activities at home.    Speech Therapy Frequency 2x / week   Duration 4 weeks   Treatment/Interventions Aspiration precaution training;Pharyngeal strengthening exercises;Oral motor exercises;SLP instruction and feedback;Internal/external aids;Compensatory strategies;Patient/family education;Functional tasks;Cueing hierarchy   Potential to Achieve Goals Good   Consulted and Agree with Plan of Care Patient          G-Codes - 03/05/15 1015    Functional  Limitations Motor speech   Motor Speech Goal Status [phone]) At least 1 percent but less than 20 percent impaired, limited or restricted   Motor Speech Goal Status ([id number]) At least 1 percent but less than 20 percent impaired, limited or restricted      SPEECH THERAPY DISCHARGE SUMMARY  Visits from Start of Care: 9  Current functional level related to goals / functional outcomes: See "SLP Short term goals" and "SLP long term goals" for more information.  The patient made gains with speech volume and was provided a HEP for swallowing, which he completed with assistance from SLP. Pt is able to complete HEP at a level adequate for home practice. Pt's vocal quality remains intermittently poor. It is believed that this is multifactorial in nature, from hydrophonic voice due to secretions on/near the vocal folds, reduced breath support, and decr'd fluids. An ENT referral may want to be made to rule out vocal fold disorders.   Remaining deficits: Mild dysarthria, mild dysphagia.   Education / Equipment: HEP for dysphagia, loud /a/, need for abdominal support for speech  Plan: Patient agrees to discharge.  Patient goals were partially met. Patient is being discharged due to being pleased with the current functional level.  ?????       Problem List Patient Active Problem List   Diagnosis Date Noted  . Effusion of right knee 02/23/2015  . HLD (hyperlipidemia)   . TIA (transient ischemic attack) 08/09/2014  . Chronic kidney disease 08/09/2014  . Atrial  fibrillation ([place]) 08/09/2014  . Right renal mass   . Atrial fibrillation with rapid ventricular response ([place]) 07/17/2014  . Elevated troponin 07/17/2014  . Diverticulosis of colon without hemorrhage 06/16/2013  . Post-operative hypothyroidism 06/16/2013  . Renal oncocytoma 06/09/2012  . LBP (low back pain) 06/09/2012  . Parkinson disease, symptomatic ([place]) 06/06/2011  . CARCINOMA, SKIN, SQUAMOUS CELL 12/10/2007  . MALIGNANT NEOPLASM OF THYROID GLAND 12/10/2007  . SUBDURAL HEMATOMA 12/10/2007  . BPH (benign prostatic hypertrophy) 12/10/2007  . Essential hypertension 04/22/2007  . Transient cerebral ischemia 04/22/2007  . Generalized atherosclerosis 04/22/2007  . Osteoarthritis 04/22/2007    [facility] , MS, CCC-SLP  03/02/2015, 1:51 PM  [facility] [address] [place], [name], [address] Phone: [phone]   Fax:  [phone]   Name: ELIGA ARVIE MRN: [id number] Date of Birth: 12-Aug-1928

## 2015-03-02 NOTE — Therapy (Signed)
[facility] [address] [place], [name], [address] Phone: [phone]   Fax:  [phone]  Occupational Therapy Treatment  Patient Details  Name: William [name] MRN: [id number] Date of Birth: [birth year] No Data Recorded  Encounter Date: 03/02/2015      OT End of Session - 03/02/15 1331    Visit Number 11   Number of Visits 17   Date for OT Re-Evaluation 03/11/15   Authorization Type [employer], no visit limit, no auth required   Authorization - Visit Number 11   Authorization - Number of Visits 11   OT Start Time 1020   OT Stop Time 1100   OT Time Calculation (min) 40 min   Activity Tolerance Patient tolerated treatment well   Behavior During Therapy [facility] for tasks assessed/performed      Past Medical History  Diagnosis Date  . Hyperlipidemia   . Hx of colonic polyps   . Benign prostatic hypertrophy with urinary obstruction   . TIA (transient ischemic attack)     pt does not recall this hx "but may have had one today" (08/09/2014)  . Subdural hematoma ([place]) 2006  . Gout   . Hypertension     sees [name]  . Thyroid disease     medullary thyroid   . Pneumonia     hx of in 1952  . Parkinson disease ([place])   . A-fib ([place])   . Complication of anesthesia     "difficulty intubation, had to use fiberoptic 2006"  . Difficult intubation   . 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 Primary skin squamous cell carcinoma   . Basal cell carcinoma   . Hypothyroidism   . DJD (degenerative joint disease)   . Arthritis     "right knee" (08/09/2014)  . Gout     "periodically" (08/09/2014)  . Kidney tumor     "tumor on cyst on kidney"   . DJD (degenerative joint disease)   . Parkinson disease [facility])     Past Surgical History  Procedure Laterality Date  . Cataract extraction w/ intraocular lens  implant, bilateral Bilateral ~ 1998  . Cholecystectomy    . Thyroidectomy       medullary carcinoma  . Eye surgery    . Knee arthroscopy Right   . Appendectomy    . Tonsillectomy    . Lumbar laminectomy/decompression microdiscectomy  05/14/2012    Procedure: LUMBAR LAMINECTOMY/DECOMPRESSION MICRODISCECTOMY 1 LEVEL;  Surgeon: [name], MD;  Location: [facility];  Service: Neurosurgery;  Laterality: Left;  Left Lumbar four-five Laminectomy/Diskectomy/Far lateral diskectomy  . Cryoablation  07-10-12    "tumor on cyst on my kidney; had an ablation"  . Skin cancer excision  "several"    "back of my neck; right eye; clavicle; nose"  . Inguinal hernia repair Right   . Back surgery    . Subdural hematoma evacuation via craniotomy  2006  . Kidney surgery      There were no vitals filed for this visit.  Visit Diagnosis:  Bradykinesia  Rigidity  Lack of coordination  Unsteadiness      Subjective Assessment - 03/02/15 1328    Subjective  "I'm ready to graduate"  "You guys do great work.  I think everything is better"   Pertinent History PD (dx 2009), 07/2014 a-fib, TIA, hx of R craniotomy (11/2004), TIA (2003), thyroid CA, partial onset seizures   Patient Stated  Goals improve ADLs and R hand use   Currently in Pain? No/denies                      OT Treatments/Exercises (OP) - 03/02/15 1345    ADLs   Overall ADLs checked goals and discussed progress.  Recommended re-eval in approx 42month.  Pt agrees.  See goals section.   UB Dressing Practiced buttoning/unbuttoning buttons using PWR! hands with improved performance.  Also practiced using button hook as pt reports that he has one at home.   Home Maintenance Practiced ambulating 273fwhile carrying glass with water and plate with loose objects.  Pt able to carry approx 2560f without spills using strategies but demo min spills if carrying 32f29f      Recommended pt discuss meds with MD/phamacist to look at timing of medication since he reports some wearing off of his medications and that he is  taking last Sinemet at or close to bed time.  Recommended exploring if pt could spread out meds evenly during awake hours.  Pt verbalized understanding.        OT Short Term Goals - 03/02/15 1032    OT SHORT TERM GOAL #1   Title Pt will be independent with PD-specific HEP.--check STGs 02/08/15   Time 4   Period Weeks   Status Achieved   OT SHORT TERM GOAL #2   Title Pt will be able to write at least 3 sentences with 100% legibility and no significant decrease in size.   Baseline 90%, min decr in size   Time 4   Period Weeks   Status Partially Met  02/14/15  95% with occasional min decr in size; 03/02/15:  95%+ with occasional min decr in size   OT SHORT TERM GOAL #3   Title Pt will improve coordination for ADLs as shown by improving time on 9-hole peg test by at least 8sec with RUE.   Baseline 60.44(eval)   Time 4   Period Weeks   Status Achieved  02/14/15   46.40sec   OT SHORT TERM GOAL #4   Title Pt will improve ability to eat as shown by completing PPT#2 in 12sec or less   Baseline 16.75 secs on 02/09/15; 13.59sec on 02/14/15   Time 4   Period Weeks   Status Not fully met.   OT SHORT TERM GOAL #5   Title Pt will improve ability to button as shown by ability to button/unbutton 3 buttons with shirt on tabletop in 70sec or less.   Baseline 83.03 checked 02/09/15   Time 4   Period Weeks   Status Achieved  53.31sec   OT SHORT TERM GOAL #6   Title Pt will verbalize understanding of ways to prevent future complications and appropriate PD-related community resources.   Time 4   Period Weeks   Status Achieved  02/07/15           OT Long Term Goals - 03/02/15 1027    OT LONG TERM GOAL #1   Title Pt will verbalize understanding of adaptive strategies/AE to incr ease/efficiency with ADLs/IADLs prn.   Time 8   Period Weeks   Status Achieved   OT LONG TERM GOAL #2   Title Pt will be able to carry glass of liquid at least 25fe55fithout spills x2.   Time 8   Period Weeks    Status Achieved  03/02/15   OT LONG TERM GOAL #3   Title Pt will improve  coordination for ADLs as shown completing 9-hole peg test in 40 sec or less with RUE.   Baseline ongoing-  RUE 43.85 secs on 02/28/15   Time 8   Period Weeks   Status Not Met   OT LONG TERM GOAL #4   Title Pt will improve coordination/functional reaching for ADLs as shown by improving score on box and blocks test by at least 6 bilaterally.   Baseline box/ blocks RUE: 45 blocksLUE 44 blocks,    Time 8   Period Weeks   Status Achieved   OT LONG TERM GOAL #5   Title Pt will improve balance/functional reaching for ADLs as shown by improving standing functional reach test to at least 10" with LUE.   Time 8   Period Weeks   Status Achieved  03-23-2015:  R-12", L-11"   OT LONG TERM GOAL #6   Title Pt will improve ability to button as shown by ability to button/unbutton 3 buttons with shirt on tabletop in 60sec or less.   Baseline ongoing- 77.13, 53.31   Time 8   Period Weeks   Status Not Met               Plan - Mar 23, 2015 1331    Clinical Impression Statement Pt has made good progress and feels comfortable discharging at this time.   Plan d/c OT   OT Home Exercise Plan issued: seated PWR!; 01/31/15 PWR! Moves in supine, ways to prevent future complications, community resources; coordination HEP; 02/07/15 PWR! moves in quadraped; 02/16/15 PWR! hands (basic 4)   Consulted and Agree with Plan of Care Patient          G-Codes - 03-23-2015 1357    Functional Assessment Tool Used 9 hole peg test RUE: 43.85 secs, 3 button/unbutton:  59.81, 53.31, box/ blocks RUE: 45 blocksLUE 44 blocks,    Functional Limitation Carrying, moving and handling objects   Carrying, Moving and Handling Objects Goal Status ([id number]) At least 20 percent but less than 40 percent impaired, limited or restricted   Carrying, Moving and Handling Objects Discharge Status [phone]) At least 20 percent but less than 40 percent impaired, limited or  restricted      OCCUPATIONAL THERAPY DISCHARGE SUMMARY  Visits from Start of Care: 11  Current functional level related to goals / functional outcomes: See above   Remaining deficits: Bradykinesia, rigidity, decr coordination, decr RUE functional use, decr balance for ADLs--all improved    Education / Equipment: Pt instructed in PD-specific HEP, strategies/AE for ADLs prn, appropriate community resources, general PD education.  Pt verbalized understanding of all education provided. Plan: Patient agrees to discharge.  Patient goals were partially met. Patient is being discharged due to being pleased with the current functional level.  Pt would benefit from re-evaluation/occupational therapy screen in approx 6 months to assess for need for further therapy/functional changes due to progressive nature of diagnosis.   Pt agrees with plan.  ?????           Problem List Patient Active Problem List   Diagnosis Date Noted  . Effusion of right knee 02/23/2015  . HLD (hyperlipidemia)   . TIA (transient ischemic attack) 08/09/2014  . Chronic kidney disease 08/09/2014  . Atrial fibrillation ([place]) 08/09/2014  . Right renal mass   . Atrial fibrillation with rapid ventricular response ([place]) 07/17/2014  . Elevated troponin 07/17/2014  . Diverticulosis of colon without hemorrhage 06/16/2013  . Post-operative hypothyroidism 06/16/2013  . Renal oncocytoma 06/09/2012  . LBP (low  back pain) 06/09/2012  . Parkinson disease, symptomatic ([place]) 06/06/2011  . CARCINOMA, SKIN, SQUAMOUS CELL 12/10/2007  . MALIGNANT NEOPLASM OF THYROID GLAND 12/10/2007  . SUBDURAL HEMATOMA 12/10/2007  . BPH (benign prostatic hypertrophy) 12/10/2007  . Essential hypertension 04/22/2007  . Transient cerebral ischemia 04/22/2007  . Generalized atherosclerosis 04/22/2007  . Osteoarthritis 04/22/2007    [facility] 03/02/2015, 1:58 PM  [facility] [address] [place], [name], [address] Phone: [phone]   Fax:  [phone]  Name: JAMIEN CASANOVA MRN: [id number] Date of Birth: 10-12-1928  Vianne Bulls, OTR/L 03/02/2015 1:58 PM

## 2015-03-02 NOTE — Therapy (Addendum)
[place] [address] [place], [name], [address] Phone: [phone]   Fax:  [phone]  Physical Therapy Treatment  Patient Details  Name: William [name] MRN: [id number] Date of Birth: Jan 08, 1929 No Data Recorded  Encounter Date: 03/02/2015      PT End of Session - 03/02/15 1219    Visit Number 12   Number of Visits 13   Date for PT Re-Evaluation 03/11/15   Authorization Type [employer] H[employer]-G-code every 10th visit   PT Start Time 0852   PT Stop Time 0931   PT Time Calculation (min) 39 min   Activity Tolerance Patient tolerated treatment well   Behavior During Therapy [facility] for tasks assessed/performed      Past Medical History  Diagnosis Date  . Hyperlipidemia   . Hx of colonic polyps   . Benign prostatic hypertrophy with urinary obstruction   . TIA (transient ischemic attack)     pt does not recall this hx "but may have had one today" (08/09/2014)  . Subdural hematoma ([place]) 2006  . Gout   . Hypertension     sees [name]  . Thyroid disease     medullary thyroid   . Pneumonia     hx of in 1952  . Parkinson disease ([place])   . A-fib ([place])   . Complication of anesthesia     "difficulty intubation, had to use fiberoptic 2006"  . Difficult intubation   . 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 Primary skin squamous cell carcinoma   . Basal cell carcinoma   . Hypothyroidism   . DJD (degenerative joint disease)   . Arthritis     "right knee" (08/09/2014)  . Gout     "periodically" (08/09/2014)  . Kidney tumor     "tumor on cyst on kidney"   . DJD (degenerative joint disease)   . Parkinson disease [facility])     Past Surgical History  Procedure Laterality Date  . Cataract extraction w/ intraocular lens  implant, bilateral Bilateral ~ 1998  . Cholecystectomy    . Thyroidectomy      medullary carcinoma  . Eye surgery    . Knee arthroscopy Right   . Appendectomy    .  Tonsillectomy    . Lumbar laminectomy/decompression microdiscectomy  05/14/2012    Procedure: LUMBAR LAMINECTOMY/DECOMPRESSION MICRODISCECTOMY 1 LEVEL;  Surgeon: [name], MD;  Location: [place] [facility];  Service: Neurosurgery;  Laterality: Left;  Left Lumbar four-five Laminectomy/Diskectomy/Far lateral diskectomy  . Cryoablation  07-10-12    "tumor on cyst on my kidney; had an ablation"  . Skin cancer excision  "several"    "back of my neck; right eye; clavicle; nose"  . Inguinal hernia repair Right   . Back surgery    . Subdural hematoma evacuation via craniotomy  2006  . Kidney surgery      There were no vitals filed for this visit.  Visit Diagnosis:  Abnormality of gait      Subjective Assessment - 03/02/15 0857    Subjective Purchased a sleeve for L knee and it seems to be helping.  Denies changes.  Played golf yesterday.   Patient Stated Goals Pt's goal for therapy is to work on balance and stamina.   Currently in Pain? No/denies           [facility] Adult PT Treatment/Exercise - 03/02/15 0907    Knee/Hip Exercises: Aerobic   Stationary  Bike Scifit level 2.0 all 4 extremities x 10 minutes for flexibility with rpm>90             PWR [facility]) - 2015-03-30 0916    PWR! exercises Moves in quadraped  modified quadruped   PWR! Up 20   PWR! Rock 20   PWR! Twist 20   PWR! Step 20   Comments modified quadruped             PT Education - 30-Mar-2015 1231    Education provided Yes   Education Details [employer] fitness, PWR! Modified quadruped   Person(s) Educated Patient   Methods Explanation;Demonstration   Comprehension Verbalized understanding          PT Short Term Goals - 02/07/15 1307    PT SHORT TERM GOAL #1   Title Pt will perform HEP with supervision for improved functional mobility, transfers, balance and gait.  TARGET 02/09/15   Time 4   Period Weeks   Status Partially Met   PT SHORT TERM GOAL #2   Title Pt will improve 5x sit<>stand to less  than or equal to 15 seconds for improved efficiency and safety with transfers.   Time 4   Period Weeks   Status Achieved   PT SHORT TERM GOAL #3   Title Pt will improve TUG cognitive to less than or equal to 15 seconds for decreased fall risk/improved dual tasking with gait.   Time 4   Period Weeks   Status Partially Met   PT SHORT TERM GOAL #4   Title Pt will verbalize understanding of local Parkinson's resources.   Time 4   Period Weeks   Status Partially Met           PT Long Term Goals - March 30, 2015 1219    PT LONG TERM GOAL #1   Title Pt will verbalize understanding of fall prevention within the home environment.  TARGET 03/11/15   Time 6   Period Weeks   Status Achieved   PT LONG TERM GOAL #2   Title Pt will perform at least 8 of 10 reps of sit<>stand transfers from 18 inch surfaces or lower, with minimal to no UE support, for improved safety and efficiency with transfers.   Time 6   Period Weeks   Status Achieved   PT LONG TERM GOAL #3   Title Pt will improve Functional Gait Assessment to at least 19/30 for decreased fall risk.   Time 6   Period Weeks   Status Achieved   PT LONG TERM GOAL #4   Title Pt will verbalize plans for continued community fitness upon D/C from PT.   Time 6   Period Weeks   Status Achieved               Plan - 03/30/2015 1220    Clinical Impression Statement Pt met all LTG's.  Discharge per Mady Haagensen, PT and re-eval in 6 months.   PT Duration --  wk 4 of 6   PT Next Visit Plan Discharge from PT   PT [place]! HEP   Consulted and Agree with Plan of Care Patient          G-Codes - March 30, 2015 1226    Functional Assessment Tool Used TUG 9.56 seconds, TUG cognitive 9.84 seconds, 5xsit<>stand 11.36 seconds      Problem List Patient Active Problem List   Diagnosis Date Noted  . Effusion of right knee 02/23/2015  . HLD (hyperlipidemia)   .  TIA (transient ischemic attack) 08/09/2014  . Chronic kidney disease  08/09/2014  . Atrial fibrillation ([place]) 08/09/2014  . Right renal mass   . Atrial fibrillation with rapid ventricular response ([place]) 07/17/2014  . Elevated troponin 07/17/2014  . Diverticulosis of colon without hemorrhage 06/16/2013  . Post-operative hypothyroidism 06/16/2013  . Renal oncocytoma 06/09/2012  . LBP (low back pain) 06/09/2012  . Parkinson disease, symptomatic ([place]) 06/06/2011  . CARCINOMA, SKIN, SQUAMOUS CELL 12/10/2007  . MALIGNANT NEOPLASM OF THYROID GLAND 12/10/2007  . SUBDURAL HEMATOMA 12/10/2007  . BPH (benign prostatic hypertrophy) 12/10/2007  . Essential hypertension 04/22/2007  . Transient cerebral ischemia 04/22/2007  . Generalized atherosclerosis 04/22/2007  . Osteoarthritis 04/22/2007    Narda Bonds 03/02/2015, 12:36 PM  [place] [address] [place], [name], [address] Phone: [phone]   Fax:  [phone]  Name: TINSLEY LOMAS MRN: [id number] Date of Birth: 01-13-1929    Narda Bonds, PTA [place] 03/02/2015 12:36 PM Phone: [phone] Fax: [phone]  Addendum: G Codes Functional Assessment Tool:   TUG 9.56 sec, TUG cognitive 9.84 sec, 5x sit<>stand 11.36 sec,  Functional Limitation: Mobility: Walking and Moving Around  Goal Status  [phone]): CI Discharge Status [id number])  CI  Mady Haagensen, PT 03/17/2015 9:18 AM Phone: [phone] Fax: [phone]

## 2015-03-02 NOTE — Patient Instructions (Signed)
  EVERY DAY * 10 loud "Hey!"  * 5  loud "AH"   6 of the 7 DAYS PER WEEK *  Swallowing exercises twice a day until Christmas to increase strength to swallow *  After Christmas, three times a week to maintain strength

## 2015-03-06 ENCOUNTER — Encounter: Payer: Self-pay | Admitting: Physical Therapy

## 2015-03-07 ENCOUNTER — Encounter: Payer: Commercial Managed Care - HMO | Admitting: Occupational Therapy

## 2015-03-07 ENCOUNTER — Ambulatory Visit: Payer: Commercial Managed Care - HMO | Admitting: Physical Therapy

## 2015-03-13 ENCOUNTER — Ambulatory Visit: Payer: Commercial Managed Care - HMO

## 2015-03-16 DIAGNOSIS — H524 Presbyopia: Secondary | ICD-10-CM | POA: Diagnosis not present

## 2015-03-16 DIAGNOSIS — H5210 Myopia, unspecified eye: Secondary | ICD-10-CM | POA: Diagnosis not present

## 2015-03-17 ENCOUNTER — Encounter: Payer: Self-pay | Admitting: Physical Therapy

## 2015-03-17 NOTE — Therapy (Signed)
[facility] [address] [place], [place], [address] Phone: [phone]   Fax:  [phone]  Patient Details  Name: William [name] MRN: [id number] Date of Birth: 08/07/1928 Referring Provider:  No ref. provider found  Encounter Date: 03/17/2015  PHYSICAL THERAPY DISCHARGE SUMMARY  Visits from Start of Care: 12  Current functional level related to goals / functional outcomes:     PT Long Term Goals - 03/02/15 1219    PT LONG TERM GOAL #1   Title Pt will verbalize understanding of fall prevention within the home environment.  TARGET 03/11/15   Time 6   Period Weeks   Status Achieved   PT LONG TERM GOAL #2   Title Pt will perform at least 8 of 10 reps of sit<>stand transfers from 18 inch surfaces or lower, with minimal to no UE support, for improved safety and efficiency with transfers.   Time 6   Period Weeks   Status Achieved   PT LONG TERM GOAL #3   Title Pt will improve Functional Gait Assessment to at least 19/30 for decreased fall risk.   Time 6   Period Weeks   Status Achieved   PT LONG TERM GOAL #4   Title Pt will verbalize plans for continued community fitness upon D/C from PT.   Time 6   Period Weeks   Status Achieved       Remaining deficits: Pt has met all long term goals, improved functional mobility noted.  Pt continues to have some balance and gait issues-improving.   Education / Equipment: HEP, fall prevention, community fitness  Plan: Patient agrees to discharge.  Patient goals were met. Patient is being discharged due to meeting the stated rehab goals.  ?????      , W. 03/17/2015, 1:02 PM  , W., PT [facility] [address] [place], [place], [address] Phone: [phone]   Fax:  [phone] 

## 2015-03-17 NOTE — Therapy (Signed)
[facility] [address] [place], [name], [address] Phone: [phone]   Fax:  [phone]  Patient Details  Name: William [name] MRN: [id number] Date of Birth: Jul 30, 1928 Referring Provider:  No ref. provider found  Encounter Date: 03/06/2015  Error-please see discharge summary from 03/17/15.  MARRIOTT,AMY W. 03/17/2015, 1:01 PM  Frazier Butt., PT  [place] [address] [place], [name], [address] Phone: [phone]   Fax:  [phone]

## 2015-03-21 ENCOUNTER — Encounter: Payer: Commercial Managed Care - HMO | Admitting: Speech Pathology

## 2015-04-13 ENCOUNTER — Other Ambulatory Visit: Payer: Self-pay | Admitting: Pulmonary Disease

## 2015-04-20 DIAGNOSIS — C44329 Squamous cell carcinoma of skin of other parts of face: Secondary | ICD-10-CM | POA: Diagnosis not present

## 2015-04-20 DIAGNOSIS — L570 Actinic keratosis: Secondary | ICD-10-CM | POA: Diagnosis not present

## 2015-06-15 ENCOUNTER — Ambulatory Visit (INDEPENDENT_AMBULATORY_CARE_PROVIDER_SITE_OTHER): Payer: Commercial Managed Care - HMO | Admitting: Primary Care

## 2015-06-15 ENCOUNTER — Encounter: Payer: Self-pay | Admitting: Primary Care

## 2015-06-15 VITALS — BP 122/64 | HR 52 | Temp 97.9°F | Ht 76.0 in | Wt 182.0 lb

## 2015-06-15 DIAGNOSIS — I10 Essential (primary) hypertension: Secondary | ICD-10-CM | POA: Diagnosis not present

## 2015-06-15 DIAGNOSIS — N400 Enlarged prostate without lower urinary tract symptoms: Secondary | ICD-10-CM | POA: Diagnosis not present

## 2015-06-15 DIAGNOSIS — L570 Actinic keratosis: Secondary | ICD-10-CM | POA: Diagnosis not present

## 2015-06-15 DIAGNOSIS — E785 Hyperlipidemia, unspecified: Secondary | ICD-10-CM | POA: Diagnosis not present

## 2015-06-15 DIAGNOSIS — G459 Transient cerebral ischemic attack, unspecified: Secondary | ICD-10-CM

## 2015-06-15 DIAGNOSIS — Z0000 Encounter for general adult medical examination without abnormal findings: Secondary | ICD-10-CM

## 2015-06-15 DIAGNOSIS — G20A1 Parkinson's disease without dyskinesia, without mention of fluctuations: Secondary | ICD-10-CM

## 2015-06-15 DIAGNOSIS — G20 Parkinson's disease: Secondary | ICD-10-CM

## 2015-06-15 DIAGNOSIS — Z85828 Personal history of other malignant neoplasm of skin: Secondary | ICD-10-CM | POA: Diagnosis not present

## 2015-06-15 DIAGNOSIS — I480 Paroxysmal atrial fibrillation: Secondary | ICD-10-CM

## 2015-06-15 MED ORDER — LEVOTHYROXINE SODIUM 100 MCG PO TABS: ORAL_TABLET | ORAL | Status: DC

## 2015-06-15 MED ORDER — SIMVASTATIN 20 MG PO TABS: 20.0000 mg | ORAL_TABLET | Freq: Every day | ORAL | Status: DC

## 2015-06-15 MED ORDER — HYDRALAZINE HCL 25 MG PO TABS: 25.0000 mg | ORAL_TABLET | Freq: Three times a day (TID) | ORAL | Status: DC

## 2015-06-15 MED ORDER — LOSARTAN POTASSIUM-HCTZ 100-12.5 MG PO TABS: 1.0000 | ORAL_TABLET | Freq: Every day | ORAL | Status: DC

## 2015-06-15 NOTE — Assessment & Plan Note (Signed)
Stable today. Continue Hyzaar.

## 2015-06-15 NOTE — Assessment & Plan Note (Addendum)
Follows closely with Neurology (Dr. Rexene Alberts). Continue sinemet IR and azilect

## 2015-06-15 NOTE — Assessment & Plan Note (Signed)
Due and pending. Continue Zocor 20 mg. Discussed importance of reducing fried and fatty foods.

## 2015-06-15 NOTE — Assessment & Plan Note (Signed)
Discussed the importance of a healthy diet and regular exercise in order to reduce risk of other medical diseases. All immunizations UTD per patient. Colonoscopy not needed again. He follows up with all providers as directed. Lipid panel and CMP pending today.  I have personally reviewed and have noted: 1. The patient's medical and social history 2. Their use of alcohol, tobacco or illicit drugs 3. Their current medications and supplements 4. The patient's functional ability including ADL's, fall  risks, home safety risks and  hearing or visual  impairment. 5. Diet and physical activities 6. Evidence for depression or mood disorder  Follow up in 1 year for repeat MWV.

## 2015-06-15 NOTE — Progress Notes (Signed)
Patient ID: William [name], male   DOB: 03/13/1929, 82 y.o.   MRN: [id number]  HPI: [name] is an 82 year old male who presents today for his Medicare Wellness Visit.  Past Medical History  Diagnosis Date  . Hyperlipidemia   . Hx of colonic polyps   . Benign prostatic hypertrophy with urinary obstruction   . TIA (transient ischemic attack)     pt does not recall this hx "but may have had one today" (08/09/2014)  . Subdural hematoma ([place]) 2006  . Gout   . Hypertension     sees [name]  . Thyroid disease     medullary thyroid   . Pneumonia     hx of in 1952  . Parkinson disease ([place])   . A-fib ([place])   . Complication of anesthesia     "difficulty intubation, had to use fiberoptic 2006"  . Difficult intubation   . 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 Primary skin squamous cell carcinoma   . Basal cell carcinoma   . Hypothyroidism   . DJD (degenerative joint disease)   . Arthritis     "right knee" (08/09/2014)  . Gout     "periodically" (08/09/2014)  . Kidney tumor     "tumor on cyst on kidney"   . DJD (degenerative joint disease)   . Parkinson disease [facility])     Current Outpatient Prescriptions  Medication Sig Dispense Refill  . apixaban (ELIQUIS) 2.5 MG TABS tablet Take 1 tablet (2.5 mg total) by mouth 2 (two) times daily. 180 tablet 3  . carbidopa-levodopa (SINEMET IR) 25-100 MG tablet Take 1 tablet by mouth 4 (four) times daily. At 8, 12, 5 PM, and 9 or 10 PM 360 tablet 3  . hydrALAZINE (APRESOLINE) 25 MG tablet Take 1 tablet (25 mg total) by mouth 3 (three) times daily. 90 tablet 3  . levothyroxine (SYNTHROID, LEVOTHROID) 100 MCG tablet TAKE 1 TABLET EVERY DAY BEFORE BREAKFAST 90 tablet 0  . losartan-hydrochlorothiazide (HYZAAR) 100-12.5 MG per tablet Take 1 tablet by mouth daily. 90 tablet 3  . Multiple Vitamin (MULTIVITAMIN WITH MINERALS) TABS Take 1 tablet by mouth daily.    . Probiotic Product (ALIGN) 4 MG CAPS Take 1  capsule by mouth daily.    . rasagiline (AZILECT) 1 MG TABS tablet Take 1 tablet (1 mg total) by mouth daily. 90 tablet 3  . simvastatin (ZOCOR) 20 MG tablet Take 1 tablet (20 mg total) by mouth at bedtime. 90 tablet 3  . [DISCONTINUED] losartan (COZAAR) 50 MG tablet Take 1 tablet (50 mg total) by mouth daily. 30 tablet 1   No current facility-administered medications for this visit.    No Known Allergies  Family History  Problem Relation Age of Onset  . Stroke Mother   . Alzheimer's disease Brother   . Stroke Father   . Alzheimer's disease Sister     Social History   Social History  . Marital Status: Widowed    Spouse Name: N/A  . Number of Children: 3  . Years of Education: 15   Occupational History  . retired    Social History Main Topics  . Smoking status: Former Smoker -- 0.80 packs/day for 15 years    Types: Cigarettes    Quit date: 04/15/1965  . Smokeless tobacco: Never Used  . Alcohol Use: 6.0 oz/week    0 Standard drinks or equivalent, 10 Glasses of wine per week  Comment: 1 glass of wine every night (6oz glass)  . Drug Use: No  . Sexual Activity: Yes   Other Topics Concern  . Not on file   Social History Narrative   Married.   Lives at home with significant other.   Retried. 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   Enjoys golfing, yard work, spending time with family.         Hospitiliaztions: In April and May 2016, none since  Health Maintenance:    Flu: Completed in October 2016  Tetanus: Completed in 2011  Pneumovax: Completed in 2011  Prevnar: Endorses that he completed 2016  Zostavax: Endorses that he received this in 2016  Colonoscopy: Completed 7-8 years ago.  Eye Doctor: Dr. Rock Nephew, Completed in January 2017  Dental Exam: Completed in September 2016  PSA: Normal in 2013, Follows with [facility] and endorses normal reading  in 2016. Due again for re-evaluation tomorrow.     Providers: Dr. Rock Nephew, Optometrist; Alma Friendly,  PCP; Dr. Risa Grill, Urology; Dr. Allyson Sabal, Dermatology; Dr. Rexene Alberts, Neurologist;; Dr. Trula Slade, Vascular; Dr Percival Spanish, Cardiology; Dr. Felton Clinton, [place] PCP; Dr. Nicki Reaper, Dentist; Dr. Cathrine Muster, Radiologist   I have personally reviewed and have noted: 1. The patient's medical and social history 2. Their use of alcohol, tobacco or illicit drugs 3. Their current medications and supplements 4. The patient's functional ability including ADL's, fall risks, home safety risks and  hearing or visual impairment. 5. Diet and physical activities 6. Evidence for depression or mood disorder  Subjective:   Review of Systems:   Constitutional: Denies fever, malaise, fatigue, headache or abrupt weight changes.  HEENT: Denies eye pain, eye redness, ear pain, ringing in the ears, wax buildup, runny nose, nasal congestion, bloody nose, or sore throat. Respiratory: Denies difficulty breathing, shortness of breath, cough or sputum production.   Cardiovascular: Denies chest pain, chest tightness, palpitations or swelling in the hands or feet.  Gastrointestinal: Denies abdominal pain, bloating, constipation, diarrhea or blood in the stool.  GU: Denies urgency, frequency, pain with urination, burning sensation, blood in urine, odor or discharge. Musculoskeletal: Denies decrease in range of motion, difficulty with gait, muscle pain or joint pain and swelling.  Skin: He's following with Dr. Allyson Sabal every 6 months, recently had removal for squamous cell carcinoma.  Neurological: Denies dizziness, difficulty with memory, difficulty with speech or problems with balance and coordination.   No other specific complaints in a complete review of systems (except as listed in HPI above).  Objective:  PE:   There were no vitals taken for this visit. Wt Readings from Last 3 Encounters:  02/23/15 181 lb (82.101 kg)  02/23/15 180 lb (81.647 kg)  01/24/15 180 lb 12.8 oz (82.01 kg)    General: Appears their stated age, well developed,  well nourished in NAD. Skin: Warm, dry and intact. No rashes, lesions or ulcerations noted. HEENT: Head: normal shape and size; Eyes: sclera white, no icterus, conjunctiva pink, PERRLA and EOMs intact; Ears: Tm's gray and intact, normal light reflex; Nose: mucosa pink and moist, septum midline; Throat/Mouth: Teeth present, mucosa pink and moist, no exudate, lesions or ulcerations noted.  Neck: Normal range of motion. Neck supple, trachea midline. No massses, lumps or thyromegaly present.  Cardiovascular: Normal rate and rhythm. S1,S2 noted.  No murmur, rubs or gallops noted. No JVD or BLE edema. No carotid bruits noted. Pulmonary/Chest: Normal effort and positive vesicular breath sounds. No respiratory distress. No wheezes, rales or ronchi noted.  Abdomen: Soft and nontender. Normal  bowel sounds, no bruits noted. No distention or masses noted. Liver, spleen and kidneys non palpable. Musculoskeletal: Normal range of motion. No signs of joint swelling. No difficulty with gait.  Neurological: Alert and oriented. Cranial nerves II-XII intact. Coordination normal. +DTRs bilaterally. Psychiatric: Mood and affect normal. Behavior is normal. Judgment and thought content normal.     BMET    Component Value Date/Time   NA 141 11/04/2014 1449   K 4.1 11/04/2014 1449   CL 100 11/04/2014 1449   CO2 31 11/04/2014 1449   GLUCOSE 100* 11/04/2014 1449   GLUCOSE 96 03/25/2006 0935   BUN 18 11/04/2014 1449   CREATININE 1.43* 11/04/2014 1449   CREATININE 1.40* 08/18/2014 1153   CALCIUM 8.2* 11/04/2014 1449   GFRNONAA 45* 08/18/2014 1141   GFRNONAA 50* 07/15/2014 1620   GFRAA 52* 08/18/2014 1141   GFRAA 57* 07/15/2014 1620    Lipid Panel     Component Value Date/Time   CHOL 124 08/10/2014 0354   TRIG 121 08/10/2014 0354   TRIG 101 03/25/2006 0935   HDL 44 08/10/2014 0354   CHOLHDL 2.8 08/10/2014 0354   CHOLHDL 2.9 CALC 03/25/2006 0935   VLDL 24 08/10/2014 0354   LDLCALC 56 08/10/2014 0354     CBC    Component Value Date/Time   WBC 7.6 11/04/2014 1449   RBC 4.78 11/04/2014 1449   HGB 14.5 11/04/2014 1449   HCT 42.6 11/04/2014 1449   PLT 153 11/04/2014 1449   MCV 89.1 11/04/2014 1449   MCH 30.3 11/04/2014 1449   MCHC 34.0 11/04/2014 1449   RDW 14.3 11/04/2014 1449   LYMPHSABS 1.0 08/18/2014 1141   MONOABS 0.4 08/18/2014 1141   EOSABS 0.1 08/18/2014 1141   BASOSABS 0.0 08/18/2014 1141    Hgb A1C Lab Results  Component Value Date   HGBA1C 5.5 08/10/2014      Assessment and Plan:   Medicare Annual Wellness Visit:  Diet: Endorses a fair diet: Breakfast: Oatmeal, toast, cereal, muffin, fast food, yougurt Lunch: Soup, sandwich Dinner: Salmon, chicken, steak, burger, vegetables, salad Snacks: Crackers, peanuts, chips Desserts: Cookies Beverages: Water, sweet tea, 1 glass of wine Physical activity: He is active, but does not routinely active.  Depression/mood screen: Negative Hearing: Intact to whispered voice Visual acuity: Grossly normal, performs annual eye exam  ADLs: Capable Fall risk: None Home safety: Good Cognitive evaluation: Intact to orientation, naming, recall and repetition EOL planning: Adv directives, full code/ I agree  Preventative Medicine: Discussed the importance of a healthy diet and regular exercise in order to reduce risk of other medical diseases. All immunizations UTD per patient. Colonoscopy not needed again. He follows up with all providers as directed. Lipid panel and CMP pending today.  Next appointment: Follow up in 6 months for re-evaluation.

## 2015-06-15 NOTE — Assessment & Plan Note (Signed)
Follows closely with dermatology (Dr. Allyson Sabal).

## 2015-06-15 NOTE — Assessment & Plan Note (Signed)
No symptoms since hospitalization. Managed on statin. Discussed to improve diet.

## 2015-06-15 NOTE — Patient Instructions (Addendum)
Schedule a lab only appointment within the next several days.  Continue your efforts towards a healthy lifestyle through diet and exercise.  Follow up in 6 months for re-evaluation, or sooner if needed.  It was a pleasure to see you today!

## 2015-06-15 NOTE — Assessment & Plan Note (Signed)
Regular rhythm today. Managed on apixaban. Follows with cardiology.

## 2015-06-15 NOTE — Progress Notes (Signed)
Pre visit review using our clinic review tool, if applicable. No additional management support is needed unless otherwise documented below in the visit note. 

## 2015-06-15 NOTE — Assessment & Plan Note (Signed)
Follows with urology

## 2015-06-16 ENCOUNTER — Other Ambulatory Visit (INDEPENDENT_AMBULATORY_CARE_PROVIDER_SITE_OTHER): Payer: Commercial Managed Care - HMO

## 2015-06-16 DIAGNOSIS — N400 Enlarged prostate without lower urinary tract symptoms: Secondary | ICD-10-CM | POA: Diagnosis not present

## 2015-06-16 DIAGNOSIS — E785 Hyperlipidemia, unspecified: Secondary | ICD-10-CM

## 2015-06-16 DIAGNOSIS — Z0000 Encounter for general adult medical examination without abnormal findings: Secondary | ICD-10-CM | POA: Diagnosis not present

## 2015-06-16 DIAGNOSIS — I10 Essential (primary) hypertension: Secondary | ICD-10-CM

## 2015-06-16 DIAGNOSIS — N281 Cyst of kidney, acquired: Secondary | ICD-10-CM | POA: Diagnosis not present

## 2015-06-16 LAB — COMPREHENSIVE METABOLIC PANEL
ALT: 11 U/L (ref 0–53)
AST: 17 U/L (ref 0–37)
Albumin: 4.8 g/dL (ref 3.5–5.2)
Alkaline Phosphatase: 53 U/L (ref 39–117)
BUN: 21 mg/dL (ref 6–23)
CHLORIDE: 102 meq/L (ref 96–112)
CO2: 33 meq/L — AB (ref 19–32)
Calcium: 9.3 mg/dL (ref 8.4–10.5)
Creatinine, Ser: 1.51 mg/dL — ABNORMAL HIGH (ref 0.40–1.50)
GFR: 46.9 mL/min — AB (ref >60.00)
GLUCOSE: 101 mg/dL — AB (ref 70–99)
POTASSIUM: 3.9 meq/L (ref 3.5–5.1)
Sodium: 144 mEq/L (ref 135–145)
Total Bilirubin: 1 mg/dL (ref 0.2–1.2)
Total Protein: 7.4 g/dL (ref 6.0–8.3)

## 2015-06-16 LAB — LIPID PANEL
CHOL/HDL RATIO: 3
Cholesterol: 160 mg/dL (ref 0–200)
HDL: 54.3 mg/dL (ref >39.00)
LDL CALC: 81 mg/dL (ref 0–99)
NONHDL: 105.58
Triglycerides: 124 mg/dL (ref 0.0–149.0)
VLDL: 24.8 mg/dL (ref 0.0–40.0)

## 2015-06-22 ENCOUNTER — Ambulatory Visit (INDEPENDENT_AMBULATORY_CARE_PROVIDER_SITE_OTHER): Payer: Commercial Managed Care - HMO | Admitting: Neurology

## 2015-06-22 ENCOUNTER — Telehealth: Payer: Self-pay | Admitting: Neurology

## 2015-06-22 ENCOUNTER — Encounter: Payer: Self-pay | Admitting: Neurology

## 2015-06-22 VITALS — BP 152/80 | HR 62 | Resp 16 | Ht 76.0 in | Wt 180.0 lb

## 2015-06-22 DIAGNOSIS — Z9181 History of falling: Secondary | ICD-10-CM | POA: Diagnosis not present

## 2015-06-22 DIAGNOSIS — G20 Parkinson's disease: Secondary | ICD-10-CM | POA: Diagnosis not present

## 2015-06-22 NOTE — Telephone Encounter (Signed)
Error

## 2015-06-22 NOTE — Patient Instructions (Signed)
We will keep the Azilect at once daily and the Sinemet at 4 times a day.  Please be careful with turns and cautions with steps.

## 2015-06-22 NOTE — Progress Notes (Signed)
Subjective:    Patient ID: William [name] is a 82 y.o. male.  HPI    Interim history:   William [name] is an 82 year old right-handed gentleman with an underlying medical history of right TIA in January 2003, SDH (s/p left craniotomy in August 2006), hypertension, hypothyroidism, thyroid cancer, partial onset seizures (off Dilantin), lumbar spine disease, status post lower back surgery at L4-5 in January 2014, renal tumor, status post kidney tumor ablative surgery in March 2014, who presents for followup consultation of his right-sided predominant [name]. He is accompanied by [name] again today. I last saw him on 02/23/2015, at which time he reported doing fairly well. He was playing golf regularly. His right knee was bothering him. He had no recent falls. He was trying to hydrate well enough. He had no new issues with A. fib and no new complaints otherwise. I noted right knee swelling. He was advised to follow-up with his primary care physician for this. We mutually agreed to keep his Parkinson's medications the same.   Today, 06/22/2015: He reports doing fairly well. He finished outpatient PT, OT and ST. I reviewed his therapy summaries in his chart. His physical therapy goals were met, his occupational therapy goals for partially met and so were his speech therapy goals. He finished therapies in November and December 2016. He did take a fall in the garage a couple of months ago, as he was coming in from the garage, has only 2 steps, but took a step backwards and slipped off falling onto his behind, head struck the car, no LOC, no headache, bruise on buttock, no sequelae. Is having rails installed. Still plays golf, just not as good.   Previously:   I saw him on 11/01/2014 at which time he reported a recent diagnosis with A. fib. He was admitted to the hospital in April. I reviewed the hospital records from 07/17/2014 through 07/18/2014. He was started on Xarelto. He was then  re-admitted on 08/09/14 to 08/10/14 due to altered mental status and slurring of speech and was suspected to have a TIA. He was seen by neuro in consultation and a baby aspirin was added to Xarelto. He presented back to the ER on 08/18/14. He a [place] wo contrast on  08/18/2014 , which was negative for any acute changes. He was seen by the Neurologist in the emergency room and it was felt that a full TIA workup was not necessary at the time. He had presented with hypertension and some slurring of speech. He was switched from Xarelto to Eliquis, because the Xarelto is not on formulary at the [place].  He endorsed recent stressors, including the recent passing of his younger brother a week prior with advanced Alzheimer's disease.   He had an MRI brain and MRA head on 08/09/14: MRI HEAD: No acute intracranial process, specifically no acute ischemia. Involutional changes. Mild white matter changes suggest chronic small vessel ischemic disease. MRA HEAD: No acute large vessel occlusion or high-grade stenosis. Mid grade stenosis RIGHT P2/3 segment.   I suggested we continue with Sinemet at 4 times a day. He was advised to continue with Azilect as well. He was advised to drink more water. In the interim, we restarted outpatient physical therapy, occupational therapy and speech therapy.  I saw him on 04/29/2014, at which time he reported doing well overall but he was more fatigued and had some excessive daytime somnolence. Memory was stable. His mood was stable. He noted some blood pressure fluctuations. Sometimes he  had a dull headache and sometimes he felt lightheaded. He had some anxiety over his lady friend's health, as she had fallen and broken rib. He continued to walk regularly and played golf. He had no recent falls. I asked him to continue with his medications, Azilect once daily and Sinemet 4 times a day.  I saw him on 10/28/2013, at which time he reported doing well. In particular, had no cognitive complaints, no  mood issues, and continued to play golf 3 times a week. He was driving well. I suggested an increase in his levodopa to 1 pill 4 times a day of the 25-100 milligrams strength. [facility] and had seen a [place] neurologist as well.    I saw him on 12/07/2012, at which time I felt that he was doing well on Azilect and levodopa. He called in December 2014 with problems with blood pressure fluctuations. He was wondering if this came from the [place] but I did not think it was due to the Azilect per se. I was reluctant to take him off of it.    I first met him on 06/03/2012 and he previously followed by Dr. Morene Antu. He has had primarily right-sided symptoms with regards to his Parkinson's, diagnosed in 2010 with symptoms dating back to late 2009 or early 2010. He had briefly tried Mirapex but was taken off d/t hypotension. L spine MRI in June 2013 showed renal cysts, degenerative joint disease most prominent at L4-5. He tried acupuncture. His MRI of the lumbar spine showed abnormalities with his kidney with a cyst and a tumor and he had ablative surgery on 07/10/12 and had a FU CT done. He had lower back surgery on 05/14/12.   I saw him back on 09/03/2012 and I suggested starting Azilect. He has been tolerating it well and both he and his girlfriend felt that he did better with it in terms of dexterity and fine motor control. He had some hypotension and lightheadedness and reduced his BP medication. He has been having some issue with gout.    His Past Medical History Is Significant For: Past Medical History  Diagnosis Date  . Hyperlipidemia   . Hx of colonic polyps   . Benign prostatic hypertrophy with urinary obstruction   . TIA (transient ischemic attack)     pt does not recall this hx "but may have had one today" (08/09/2014)  . Subdural hematoma ([place]) 2006  . Gout   . Hypertension     sees [name]  . Thyroid disease     medullary thyroid   . Pneumonia      hx of in 1952  . Parkinson disease ([place])   . A-fib ([place])   . Complication of anesthesia     "difficulty intubation, had to use fiberoptic 2006"  . Difficult intubation   . 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 Primary skin squamous cell carcinoma   . Basal cell carcinoma   . Hypothyroidism   . DJD (degenerative joint disease)   . Arthritis     "right knee" (08/09/2014)  . Gout     "periodically" (08/09/2014)  . Kidney tumor     "tumor on cyst on kidney"   . DJD (degenerative joint disease)   . Parkinson disease ([place])     His Past Surgical History Is Significant For: Past Surgical History  Procedure Laterality Date  . Cataract extraction w/  intraocular lens  implant, bilateral Bilateral ~ 1998  . Cholecystectomy    . Thyroidectomy      medullary carcinoma  . Eye surgery    . Knee arthroscopy Right   . Appendectomy    . Tonsillectomy    . Lumbar laminectomy/decompression microdiscectomy  05/14/2012    Procedure: LUMBAR LAMINECTOMY/DECOMPRESSION MICRODISCECTOMY 1 LEVEL;  Surgeon: [name], MD;  Location: [facility];  Service: Neurosurgery;  Laterality: Left;  Left Lumbar four-five Laminectomy/Diskectomy/Far lateral diskectomy  . Cryoablation  07-10-12    "tumor on cyst on my kidney; had an ablation"  . Skin cancer excision  "several"    "back of my neck; right eye; clavicle; nose"  . Inguinal hernia repair Right   . Back surgery    . Subdural hematoma evacuation via craniotomy  2006  . Kidney surgery      His Family History Is Significant For: Family History  Problem Relation Age of Onset  . Stroke Mother   . Alzheimer's disease Brother   . Stroke Father   . Alzheimer's disease Sister     His Social History Is Significant For: Social History   Social History  . Marital Status: Widowed    Spouse Name: N/A  . Number of Children: 3  . Years of Education: 15   Occupational History  . retired    Social History Main  Topics  . Smoking status: Former Smoker -- 0.80 packs/day for 15 years    Types: Cigarettes    Quit date: 04/15/1965  . Smokeless tobacco: Never Used  . Alcohol Use: 6.0 oz/week    0 Standard drinks or equivalent, 10 Glasses of wine per week     Comment: 1 glass of wine every night (6oz glass)  . Drug Use: No  . Sexual Activity: Yes   Other Topics Concern  . None   Social History Narrative   Married.   Lives at home with significant other.   Retried. 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   Enjoys golfing, yard work, spending time with family.         His Allergies Are:  No Known Allergies:   His Current Medications Are:  Outpatient Encounter Prescriptions as of 06/22/2015  Medication Sig  . apixaban (ELIQUIS) 2.5 MG TABS tablet Take 1 tablet (2.5 mg total) by mouth 2 (two) times daily.  . carbidopa-levodopa (SINEMET IR) 25-100 MG tablet Take 1 tablet by mouth 4 (four) times daily. At 8, 12, 5 PM, and 9 or 10 PM  . hydrALAZINE (APRESOLINE) 25 MG tablet Take 1 tablet (25 mg total) by mouth 3 (three) times daily.  Marland Kitchen levothyroxine (SYNTHROID, LEVOTHROID) 100 MCG tablet TAKE 1 TABLET EVERY DAY BEFORE BREAKFAST  . losartan-hydrochlorothiazide (HYZAAR) 100-12.5 MG tablet Take 1 tablet by mouth daily.  . Multiple Vitamin (MULTIVITAMIN WITH MINERALS) TABS Take 1 tablet by mouth daily.  . Probiotic Product (ALIGN) 4 MG CAPS Take 1 capsule by mouth daily.  . rasagiline (AZILECT) 1 MG TABS tablet Take 1 tablet (1 mg total) by mouth daily.  . simvastatin (ZOCOR) 20 MG tablet Take 1 tablet (20 mg total) by mouth at bedtime.   No facility-administered encounter medications on file as of 06/22/2015.  :  Review of Systems:  Out of a complete 14 point review of systems, all are reviewed and negative with the exception of these symptoms as listed below:   Review of Systems  Neurological:       Since last visit,  patient has been to PT, OT, and ST. No new concerns.     Objective:  Neurologic  Exam  Physical Exam Physical Examination:   Filed Vitals:   06/22/15 1256  BP: 152/80  Pulse: 62  Resp: 16   General Examination: The patient is a very pleasant 82 y.o. male in no acute distress. He has no dizziness or lightheadedness upon standing. He is in good spirits today, as usual.  HEENT: He has moderate degree of nuchal rigidity with some decrease in passive range of motion. He reports no neck pain. Face is symmetric with decrease in facial animation. Speech is moderately hypophonic and mildly dysarthric. Hearing is grossly intact. Extraocular tracking is fairly good with mild saccadic breakdown. Pupils are reactive to light. Airway is clear with mild mouth dryness noted.  Chest is clear to auscultation without wheezing, or rhonchi or crackles noted.  Heart sounds are normal without murmurs, rubs or gallops.  Abdomen is soft, nontender with normal bowel sounds.  He has no pitting edema in the distal lower extremities, but he has right knee swelling. He has mild tenderness with knee movement. The swelling is in the medial aspect and there seems to be fluid, some crepitation is noted as well. Skin is warm and dry.  Neurologically: Mental status: The patient is awake, alert and oriented in all 4 spheres. His memory, attention, language and knowledge are appropriate. He has no aphasia, agnosia, apraxia or anomia. Cranial nerves are as described under HEENT exam. Motor exam: Normal bulk and strength is noted. He has increased tone in the right upper extremity and to a lesser degree in the right lower extremity. Mild cogwheeling is noted. He has a moderate constant resting tremor in the right upper extremity only. He has no other tremor. He has no significant action or postural tremor today.  Fine motor skills with finger taps and hand movements are moderately impaired on the right and mild to moderately impaired on the left. He stands up with mild difficulty from the seated position and has  to push himself up. He has a mildly stooped posture. He walks with decreased arm swing and a little bit more caution today. Balance is slightly affected. Sensory exam is intact to light touch. He turns fairly well. Romberg is negative. Reflexes are 1+ throughout.there is no evidence of dyskinesias.  Assessment and Plan:   In summary, William [name] is a very pleasant 82 year old gentleman with right-sided predominant, tremor predominant [name], in its 6 or 7 th+ year. He has a complex medical history and is status post lower back surgery at level L4/5 in January 2014, and kidney tumor ablative surgery in March 2014 and fairly recent diagnosis of atrial fibrillation in April 2016 with an admission in early April 2016 as well as TIA diagnosis in late April 2016. He is currently on Eliquis. He has had no further episodes of A. Fib. He saw his cardiologist in August 2016 and was told to come back in a year which is a good sign. He may need to follow-up with his radiologist. He usually sees his urologist as well. He had an ER visit in early May 2016 due to hypertension and slurring of speech. Parkinson's symptoms and exam are stable. We mutually agreed to continue his Azilect once daily and Sinemet 4 times a day. He did not need refills today. We talked about fall precaution today. He is advised to be really cautious with turn and steps and install railings where he  can.I will see him back in 4 months, sooner if needed. I answered all their questions today and the patient and [name] were in agreement. I spent 25 minutes in total face-to-face time with the patient, more than 50% of which was spent in counseling and coordination of care, reviewing test results, reviewing medication and discussing or reviewing the diagnosis of PD, TIA, the prognosis and treatment options.

## 2015-07-05 ENCOUNTER — Other Ambulatory Visit (HOSPITAL_COMMUNITY): Payer: Self-pay | Admitting: Interventional Radiology

## 2015-07-05 DIAGNOSIS — N2889 Other specified disorders of kidney and ureter: Secondary | ICD-10-CM

## 2015-07-27 ENCOUNTER — Encounter (HOSPITAL_COMMUNITY): Payer: Self-pay

## 2015-07-27 ENCOUNTER — Ambulatory Visit (HOSPITAL_COMMUNITY)
Admission: RE | Admit: 2015-07-27 | Discharge: 2015-07-27 | Disposition: A | Discharge status: Home / Self Care | Payer: Commercial Managed Care - HMO | Source: Ambulatory Visit | Attending: Interventional Radiology | Admitting: Interventional Radiology

## 2015-07-27 DIAGNOSIS — N2889 Other specified disorders of kidney and ureter: Secondary | ICD-10-CM

## 2015-07-27 DIAGNOSIS — K5710 Diverticulosis of small intestine without perforation or abscess without bleeding: Secondary | ICD-10-CM | POA: Diagnosis not present

## 2015-07-27 DIAGNOSIS — Z9049 Acquired absence of other specified parts of digestive tract: Secondary | ICD-10-CM | POA: Insufficient documentation

## 2015-07-27 DIAGNOSIS — Z923 Personal history of irradiation: Secondary | ICD-10-CM | POA: Diagnosis not present

## 2015-07-27 DIAGNOSIS — N281 Cyst of kidney, acquired: Secondary | ICD-10-CM | POA: Insufficient documentation

## 2015-07-27 DIAGNOSIS — K838 Other specified diseases of biliary tract: Secondary | ICD-10-CM | POA: Insufficient documentation

## 2015-07-27 DIAGNOSIS — I714 Abdominal aortic aneurysm, without rupture: Secondary | ICD-10-CM | POA: Insufficient documentation

## 2015-07-27 DIAGNOSIS — R911 Solitary pulmonary nodule: Secondary | ICD-10-CM | POA: Insufficient documentation

## 2015-07-27 MED ORDER — IOPAMIDOL (ISOVUE-300) INJECTION 61%
100.0000 mL | Freq: Once | INTRAVENOUS | Status: AC | PRN
  Administered 2015-07-27: 80 mL via INTRAVENOUS

## 2015-08-01 ENCOUNTER — Ambulatory Visit
Admission: RE | Admit: 2015-08-01 | Discharge: 2015-08-01 | Disposition: A | Discharge status: Home / Self Care | Payer: Commercial Managed Care - HMO | Source: Ambulatory Visit | Attending: Interventional Radiology | Admitting: Interventional Radiology

## 2015-08-01 DIAGNOSIS — C641 Malignant neoplasm of right kidney, except renal pelvis: Secondary | ICD-10-CM | POA: Diagnosis not present

## 2015-08-01 DIAGNOSIS — N2889 Other specified disorders of kidney and ureter: Secondary | ICD-10-CM

## 2015-08-01 NOTE — Progress Notes (Signed)
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  History of Present Illness: William [name] is a 82 y.o. male last seen one year ago for follow-up after renal cryoablation. Since his last visit on 07/19/2014, William [name] was admitted after sustaining a TIA with dysarthria last April. This resolved with imaging demonstrating no evidence of cerebral infarct. He has had no additional neurologic symptoms or episodes since that time and is following up with Dr. Rexene Alberts. He remains on Eliquis and is followed by Dr. Percival Spanish for atrial fibrillation. He currently is in sinus rhythm. He has followed up with Dr. Trula Slade regarding his mildly enlarging abdominal aortic aneurysm and will have follow-up later this year. He denies any abdominal pain or hematuria. He remains quite active and plays golf regularly.  Past Medical History  Diagnosis Date  . Hyperlipidemia   . Hx of colonic polyps   . Benign prostatic hypertrophy with urinary obstruction   . TIA (transient ischemic attack)     pt does not recall this hx "but may have had one today" (08/09/2014)  . Subdural hematoma ([place]) 2006  . Gout   . Hypertension     sees [name]  . Thyroid disease     medullary thyroid   . Pneumonia     hx of in 1952  . Parkinson disease ([place])   . A-fib ([place])   . Complication of anesthesia     "difficulty intubation, had to use fiberoptic 2006"  . Difficult intubation   . 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 Primary skin squamous cell carcinoma   . Basal cell carcinoma   . Hypothyroidism   . DJD (degenerative joint disease)   . Arthritis     "right knee" (08/09/2014)  . Gout     "periodically" (08/09/2014)  . Kidney tumor     "tumor on cyst on kidney"   . DJD (degenerative joint disease)   . Parkinson disease [facility])     Past Surgical History  Procedure Laterality Date  . Cataract extraction w/  intraocular lens  implant, bilateral Bilateral ~ 1998  . Cholecystectomy    . Thyroidectomy      medullary carcinoma  . Eye surgery    . Knee arthroscopy Right   . Appendectomy    . Tonsillectomy    . Lumbar laminectomy/decompression microdiscectomy  05/14/2012    Procedure: LUMBAR LAMINECTOMY/DECOMPRESSION MICRODISCECTOMY 1 LEVEL;  Surgeon: [name], MD;  Location: [place] [facility];  Service: Neurosurgery;  Laterality: Left;  Left Lumbar four-five Laminectomy/Diskectomy/Far lateral diskectomy  . Cryoablation  07-10-12    "tumor on cyst on my kidney; had an ablation"  . Skin cancer excision  "several"    "back of my neck; right eye; clavicle; nose"  . Inguinal hernia repair Right   . Back surgery    . Subdural hematoma evacuation via craniotomy  2006  . Kidney surgery      Allergies: Review of patient's allergies indicates no known allergies.  Medications: Prior to Admission medications   Medication Sig Start Date End Date Taking? Authorizing Provider  apixaban (ELIQUIS) 2.5 MG TABS tablet Take 1 tablet (2.5 mg total) by mouth 2 (two) times daily. 10/06/14  Yes Minus Breeding, MD  carbidopa-levodopa (SINEMET IR) 25-100 MG tablet Take 1 tablet by mouth 4 (four) times daily. At 8, 12, 5 PM, and 9 or 10 PM 02/23/15  Yes Star Age,  MD  hydrALAZINE (APRESOLINE) 25 MG tablet Take 1 tablet (25 mg total) by mouth 3 (three) times daily. 06/15/15  Yes Pleas Koch, NP  levothyroxine (SYNTHROID, LEVOTHROID) 100 MCG tablet TAKE 1 TABLET EVERY DAY BEFORE BREAKFAST 06/15/15  Yes Pleas Koch, NP  losartan-hydrochlorothiazide (HYZAAR) 100-12.5 MG tablet Take 1 tablet by mouth daily. 06/15/15  Yes Pleas Koch, NP  Probiotic Product (ALIGN) 4 MG CAPS Take 1 capsule by mouth daily.   Yes Historical Provider, MD  rasagiline (AZILECT) 1 MG TABS tablet Take 1 tablet (1 mg total) by mouth daily. 02/23/15  Yes Star Age, MD  simvastatin (ZOCOR) 20 MG tablet Take 1 tablet (20 mg total) by mouth  at bedtime. 06/15/15  Yes Pleas Koch, NP  Multiple Vitamin (MULTIVITAMIN WITH MINERALS) TABS Take 1 tablet by mouth daily. Reported on 08/01/2015    Historical Provider, MD     Family History  Problem Relation Age of Onset  . Stroke Mother   . Alzheimer's disease Brother   . Stroke Father   . Alzheimer's disease Sister     Social History   Social History  . Marital Status: Widowed    Spouse Name: N/A  . Number of Children: 3  . Years of Education: 15   Occupational History  . retired    Social History Main Topics  . Smoking status: Former Smoker -- 0.80 packs/day for 15 years    Types: Cigarettes    Quit date: 04/15/1965  . Smokeless tobacco: Never Used  . Alcohol Use: 6.0 oz/week    0 Standard drinks or equivalent, 10 Glasses of wine per week     Comment: 1 glass of wine every night (6oz glass)  . Drug Use: No  . Sexual Activity: Yes   Other Topics Concern  . Not on file   Social History Narrative   Married.   Lives at home with significant other.   Retried. 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   Enjoys golfing, yard work, spending time with family.         Review of Systems: A 12 point ROS discussed and pertinent positives are indicated in the HPI above.  All other systems are negative.  Review of Systems  Constitutional: Negative.   Respiratory: Negative.   Cardiovascular: Negative.   Gastrointestinal: Negative.   Genitourinary: Negative.   Musculoskeletal: Negative.   Neurological: Negative.        No change in chronic tremor.    Vital Signs: Pulse 51  Temp(Src) 97.8 F (36.6 C) (Oral)  Ht '6\' 4"'$  (1.93 m)  Wt 180 lb (81.647 kg)  BMI 21.92 kg/m2  SpO2 99%  Physical Exam  Constitutional: He is oriented to person, place, and time. He appears well-developed and well-nourished. No distress.  HENT:  Head: Normocephalic and atraumatic.  Cardiovascular: Normal rate, regular rhythm and normal heart sounds.  Exam reveals no gallop and no friction  rub.   No murmur heard. Pulmonary/Chest: Effort normal and breath sounds normal. No respiratory distress. He has no wheezes. He has no rales.  Abdominal: Soft. Bowel sounds are normal. He exhibits no distension and no mass. There is no tenderness. There is no rebound and no guarding.  Musculoskeletal: He exhibits no edema.  Neurological: He is alert and oriented to person, place, and time.  Skin: He is not diaphoretic.  Nursing note and vitals reviewed.   Imaging: Ct Abd Wo & W Cm  07/27/2015  CLINICAL DATA:  Patient with  history of prior ablation of right renal oncocytic neoplasm in 2014. Follow-up evaluation. EXAM: CT ABDOMEN WITHOUT AND WITH CONTRAST TECHNIQUE: Multidetector CT imaging of the abdomen was performed following the standard protocol before and following the bolus administration of intravenous contrast. CONTRAST:  4m ISOVUE-300 IOPAMIDOL (ISOVUE-300) INJECTION 61% COMPARISON:  CT abdomen pelvis 07/19/2014. FINDINGS: Lower chest: There is a 3.7 cm subpleural irregular nodular opacity within the right lower lobe (image 17; series 6). The majority of this is new from prior examination. Hepatobiliary: Liver is normal in size and contour. Unchanged intrahepatic and extrahepatic biliary ductal dilatation. Patient status post cholecystectomy. Pancreas: Unremarkable Spleen: Unremarkable Calcified granulomata. Adrenals/Urinary Tract: Grossly unchanged soft tissue and fat attenuation lesion within the inferior pole of the right kidney status post cryoablation, measuring 2.0 x 2.1 cm (image 68; series 4), previously 1.9 x 2.1 cm. Unchanged simple cysts within the bilateral kidneys. Re- demonstrated severe right pelviectasis without evidence for ureteral dilatation, most compatible with chronic UPJ obstruction. Stomach/Bowel: Small duodenum diverticulum. No abnormal bowel wall thickening or evidence for bowel obstruction. No free intraperitoneal air. Vascular/Lymphatic: Grossly unchanged infrarenal  abdominal aortic aneurysm measuring 3.7 x 3.5 cm, previously 3.9 x 3.4 cm. No retroperitoneal adenopathy. Other: None. Musculoskeletal: Lower thoracic and lumbar spine degenerative changes. No aggressive or acute appearing osseous lesions. IMPRESSION: Indeterminate subpleural opacity within the right lower lobe which may represent infection, scarring and/or atelectasis however given the difference in appearance over time, neoplasm is not excluded. Recommend dedicated follow-up chest CT in 1 month. If this persists, a dedicated PET-CT could be considered. Grossly unchanged cryoablation site within the lower pole of the right kidney. No evidence for localized recurrence or metastatic disease within the abdomen. Unchanged bilateral renal cysts and chronic right UPJ obstruction. Prior cholecystectomy with unchanged biliary ductal dilatation. Re- demonstrated infrarenal abdominal aortic aneurysm. Recommend followup by ultrasound in 2 years. This recommendation follows ACR consensus guidelines: White Paper of the ACR Incidental Findings Committee II on Vascular Findings. J Am Coll Radiol 2013; 10:789-794. These results will be called to the ordering clinician or representative by the Radiologist Assistant, and communication documented in the PACS or zVision Dashboard. Electronically Signed   By: DLovey NewcomerM.D.   On: 07/27/2015 10:52    Labs:  CBC:  Recent Labs  08/09/14 1530 08/09/14 1536 08/18/14 1141 08/18/14 1153 11/04/14 1449  WBC 5.3  --  4.9  --  7.6  HGB 15.6 16.7 15.2 16.3 14.5  HCT 47.8 49.0 46.8 48.0 42.6  PLT 140*  --  125*  --  153    COAGS:  Recent Labs  08/09/14 1530 08/18/14 1141  INR 1.42 1.56*  APTT 36 37    BMP:  Recent Labs  08/09/14 1530  08/18/14 1141 08/18/14 1153 11/04/14 1449 06/16/15 0850  NA 138  < > 139 139 141 144  K 3.9  < > 3.8 3.9 4.1 3.9  CL 98  < > 100* 101 100 102  CO2 30  --  31  --  31 33*  GLUCOSE 92  < > 117* 115* 100* 101*  BUN 15  < > _0 CALCIUM 8.6  --  8.4*  --  8.2* 9.3  CREATININE 1.43*  < > 1.40* 1.40* 1.43* 1.51*  GFRNONAA 43*  --  45*  --   --   --   GFRAA 50*  --  52*  --   --   --   < > =  values in this interval not displayed.  LIVER FUNCTION TESTS:  Recent Labs  08/09/14 1530 08/18/14 1141 06/16/15 0850  BILITOT 1.0 1.1 1.0  AST _0 ALT 5 5* 11  ALKPHOS 57 57 53  PROT 6.6 6.6 7.4  ALBUMIN 4.1 4.0 4.8     Assessment and Plan:  I met with William [name] and reviewed the CT of the abdomen performed on 07/27/2015. This demonstrates stable ablation defect of the right kidney with no evidence of recurrent enhancing tumor. Renal cysts are stable bilaterally. Abdominal aortic aneurysm appears stable and measures 3.7 cm in AP diameter. There is chronic appearing opacity at the posterior right lung base in a subpleural location. Area of consolidative appearing density in this region appears more prominent by CT. Follow-up CT was recommended. He has no respiratory symptoms.  I recommended follow-up CT of the abdomen in one year to follow the ablated oncocytic neoplasm. The subpleural opacity at the posterior basilar right lower lung does appear somewhat more prominent by CT. I recommended a follow-up chest CT in 3 months. We will schedule that exam.  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

## 2015-08-17 ENCOUNTER — Other Ambulatory Visit: Payer: Self-pay | Admitting: *Deleted

## 2015-08-17 DIAGNOSIS — I714 Abdominal aortic aneurysm, without rupture, unspecified: Secondary | ICD-10-CM

## 2015-08-24 ENCOUNTER — Encounter: Payer: Self-pay | Admitting: Family

## 2015-08-29 ENCOUNTER — Ambulatory Visit (HOSPITAL_COMMUNITY)
Admission: RE | Admit: 2015-08-29 | Discharge: 2015-08-29 | Disposition: A | Discharge status: Home / Self Care | Payer: Commercial Managed Care - HMO | Source: Ambulatory Visit | Attending: Family | Admitting: Family

## 2015-08-29 ENCOUNTER — Ambulatory Visit (INDEPENDENT_AMBULATORY_CARE_PROVIDER_SITE_OTHER): Payer: 59 | Admitting: Family

## 2015-08-29 ENCOUNTER — Encounter: Payer: Self-pay | Admitting: Family

## 2015-08-29 VITALS — BP 133/77 | HR 53 | Ht 76.0 in | Wt 178.0 lb

## 2015-08-29 DIAGNOSIS — R0989 Other specified symptoms and signs involving the circulatory and respiratory systems: Secondary | ICD-10-CM | POA: Diagnosis not present

## 2015-08-29 DIAGNOSIS — E785 Hyperlipidemia, unspecified: Secondary | ICD-10-CM | POA: Insufficient documentation

## 2015-08-29 DIAGNOSIS — I714 Abdominal aortic aneurysm, without rupture, unspecified: Secondary | ICD-10-CM

## 2015-08-29 DIAGNOSIS — I10 Essential (primary) hypertension: Secondary | ICD-10-CM | POA: Diagnosis not present

## 2015-08-29 NOTE — Patient Instructions (Signed)
Abdominal Aortic Aneurysm An aneurysm is a weakened or damaged part of an artery wall that bulges from the normal force of blood pumping through the body. An abdominal aortic aneurysm is an aneurysm that occurs in the lower part of the aorta, the main artery of the body.  The major concern with an abdominal aortic aneurysm is that it can enlarge and burst (rupture) or blood can flow between the layers of the wall of the aorta through a tear (aorticdissection). Both of these conditions can cause bleeding inside the body and can be life threatening unless diagnosed and treated promptly. CAUSES  The exact cause of an abdominal aortic aneurysm is unknown. Some contributing factors are:   A hardening of the arteries caused by the buildup of fat and other substances in the lining of a blood vessel (arteriosclerosis).  Inflammation of the walls of an artery (arteritis).   Connective tissue diseases, such as Marfan syndrome.   Abdominal trauma.   An infection, such as syphilis or staphylococcus, in the wall of the aorta (infectious aortitis) caused by bacteria. RISK FACTORS  Risk factors that contribute to an abdominal aortic aneurysm may include:  Age older than 60 years.   High blood pressure (hypertension).  Male gender.  Ethnicity (white race).  Obesity.  Family history of aneurysm (first degree relatives only).  Tobacco use. PREVENTION  The following healthy lifestyle habits may help decrease your risk of abdominal aortic aneurysm:  Quitting smoking. Smoking can raise your blood pressure and cause arteriosclerosis.  Limiting or avoiding alcohol.  Keeping your blood pressure, blood sugar level, and cholesterol levels within normal limits.  Decreasing your salt intake. In somepeople, too much salt can raise blood pressure and increase your risk of abdominal aortic aneurysm.  Eating a diet low in saturated fats and cholesterol.  Increasing your fiber intake by including  whole grains, vegetables, and fruits in your diet. Eating these foods may help lower blood pressure.  Maintaining a healthy weight.  Staying physically active and exercising regularly. SYMPTOMS  The symptoms of abdominal aortic aneurysm may vary depending on the size and rate of growth of the aneurysm.Most grow slowly and do not have any symptoms. When symptoms do occur, they may include:  Pain (abdomen, side, lower back, or groin). The pain may vary in intensity. A sudden onset of severe pain may indicate that the aneurysm has ruptured.  Feeling full after eating only small amounts of food.  Nausea or vomiting or both.  Feeling a pulsating lump in the abdomen.  Feeling faint or passing out. DIAGNOSIS  Since most unruptured abdominal aortic aneurysms have no symptoms, they are often discovered during diagnostic exams for other conditions. An aneurysm may be found during the following procedures:  Ultrasonography (A one-time screening for abdominal aortic aneurysm by ultrasonography is also recommended for all men aged 65-75 years who have ever smoked).  X-ray exams.  A computed tomography (CT).  Magnetic resonance imaging (MRI).  Angiography or arteriography. TREATMENT  Treatment of an abdominal aortic aneurysm depends on the size of your aneurysm, your age, and risk factors for rupture. Medication to control blood pressure and pain may be used to manage aneurysms smaller than 6 cm. Regular monitoring for enlargement may be recommended by your caregiver if:  The aneurysm is 3-4 cm in size (an annual ultrasonography may be recommended).  The aneurysm is 4-4.5 cm in size (an ultrasonography every 6 months may be recommended).  The aneurysm is larger than 4.5 cm in   size (your caregiver may ask that you be examined by a vascular surgeon). If your aneurysm is larger than 6 cm, surgical repair may be recommended. There are two main methods for repair of an aneurysm:   Endovascular  repair (a minimally invasive surgery). This is done most often.  Open repair. This method is used if an endovascular repair is not possible.   This information is not intended to replace advice given to you by your health care provider. Make sure you discuss any questions you have with your health care provider.   Document Released: 01/09/2005 Document Revised: 07/27/2012 Document Reviewed: 05/01/2012 Elsevier Interactive Patient Education 2016 [employer]  

## 2015-08-29 NOTE — Progress Notes (Addendum)
[facility] [place]   CC: Follow up Abdominal Aortic Aneurysm  History of Present Illness  William [name] is a 82 y.o. (Jul 10, 1928) male patient of Dr. Trula Slade who has been treated by Dr. 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 His CTA for follow up revealed a 3.9cm AAA. Pt states Dr. 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   He smoked for about 10 years, quit in the 1960's.    In April or May or 2016 he was in the ER for atrial fibrillation. His HTN medications have been tweaked for better control. He follows up with Dr. Percival Spanish for this. He also has [name] and he states that he was on a medication for this that could cause hypertension over time. He has discontinued this medication and he states his blood pressure has been under better control.  He also had a TIA in April or May of 2016 in which he states he was reading an article on the computer and couldn't comprehend what he was reading. When he was trying to speak, he was unable to and 911 was called. By the time the responder arrived, his symptoms had resolved.  Pt and wife deny any subsequent TIA or stroke.   He is on a statin for his cholesterol. He is on an ARB for his HTN. He is also on an aspirin. He does take Eliquis due to his history of Afib, states he has not has any subsequent known atrial fib.   Past Medical History  Diagnosis Date  . Hyperlipidemia   . Hx of colonic polyps   . Benign prostatic hypertrophy with urinary obstruction   . TIA (transient ischemic attack)     pt does not recall this hx "but may have had one today" (08/09/2014)  . Subdural hematoma ([place]) 2006  . Gout   . Hypertension     sees [name]  . Thyroid disease     medullary thyroid   . Pneumonia     hx of in 1952  . Parkinson disease ([place])   . A-fib ([place])   . Complication of  anesthesia     "difficulty intubation, had to use fiberoptic 2006"  . Difficult intubation   . 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 Primary skin squamous cell carcinoma   . Basal cell carcinoma   . Hypothyroidism   . DJD (degenerative joint disease)   . Arthritis     "right knee" (08/09/2014)  . Gout     "periodically" (08/09/2014)  . Kidney tumor     "tumor on cyst on kidney"   . DJD (degenerative joint disease)   . Parkinson disease ([place])   . Mini stroke [facility])    Past Surgical History  Procedure Laterality Date  . Cataract extraction w/ intraocular lens  implant, bilateral Bilateral ~ 1998  . Cholecystectomy    . Thyroidectomy      medullary carcinoma  . Eye surgery    . Knee arthroscopy Right   . Appendectomy    . Tonsillectomy    . Lumbar laminectomy/decompression microdiscectomy  05/14/2012    Procedure: LUMBAR LAMINECTOMY/DECOMPRESSION MICRODISCECTOMY 1 LEVEL;  Surgeon: [name], MD;  Location: [place] [facility];  Service: Neurosurgery;  Laterality: Left;  Left Lumbar four-five Laminectomy/Diskectomy/Far lateral diskectomy  . Cryoablation  07-10-12    "tumor on  cyst on my kidney; had an ablation"  . Skin cancer excision  "several"    "back of my neck; right eye; clavicle; nose"  . Inguinal hernia repair Right   . Back surgery    . Subdural hematoma evacuation via craniotomy  2006  . Kidney surgery     Social History Social History   Social History  . Marital Status: Widowed    Spouse Name: N/A  . Number of Children: 3  . Years of Education: 15   Occupational History  . retired    Social History Main Topics  . Smoking status: Former Smoker -- 0.80 packs/day for 15 years    Types: Cigarettes    Quit date: 04/15/1965  . Smokeless tobacco: Never Used  . Alcohol Use: 6.0 oz/week    0 Standard drinks or equivalent, 10 Glasses of wine per week     Comment: 1 glass of wine every night (6oz glass)  . Drug Use: No  . Sexual  Activity: Yes   Other Topics Concern  . Not on file   Social History Narrative   Married.   Lives at home with significant other.   Retried. 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   Enjoys golfing, yard work, spending time with family.        Family History Family History  Problem Relation Age of Onset  . Stroke Mother   . Alzheimer's disease Brother   . Stroke Father   . Heart disease Father   . Alzheimer's disease Sister     Current Outpatient Prescriptions on File Prior to Visit  Medication Sig Dispense Refill  . apixaban (ELIQUIS) 2.5 MG TABS tablet Take 1 tablet (2.5 mg total) by mouth 2 (two) times daily. 180 tablet 3  . carbidopa-levodopa (SINEMET IR) 25-100 MG tablet Take 1 tablet by mouth 4 (four) times daily. At 8, 12, 5 PM, and 9 or 10 PM 360 tablet 3  . hydrALAZINE (APRESOLINE) 25 MG tablet Take 1 tablet (25 mg total) by mouth 3 (three) times daily. 90 tablet 3  . levothyroxine (SYNTHROID, LEVOTHROID) 100 MCG tablet TAKE 1 TABLET EVERY DAY BEFORE BREAKFAST 90 tablet 0  . losartan-hydrochlorothiazide (HYZAAR) 100-12.5 MG tablet Take 1 tablet by mouth daily. 90 tablet 3  . Multiple Vitamin (MULTIVITAMIN WITH MINERALS) TABS Take 1 tablet by mouth daily. Reported on 08/01/2015    . Probiotic Product (ALIGN) 4 MG CAPS Take 1 capsule by mouth daily.    . rasagiline (AZILECT) 1 MG TABS tablet Take 1 tablet (1 mg total) by mouth daily. 90 tablet 3  . simvastatin (ZOCOR) 20 MG tablet Take 1 tablet (20 mg total) by mouth at bedtime. 90 tablet 3  . [DISCONTINUED] losartan (COZAAR) 50 MG tablet Take 1 tablet (50 mg total) by mouth daily. 30 tablet 1   No current facility-administered medications on file prior to visit.   No Known Allergies  ROS: See HPI for pertinent positives and negatives.  Physical Examination  Filed Vitals:   08/29/15 0909  BP: 133/77  Pulse: 53  Height: 6\' 4"  (1.93 m)  Weight: 178 lb (80.74 kg)  SpO2: 97%   Body mass index is 21.68  kg/(m^2).  General: WDWN in NAD  Gait: Normal  HENT: WNL, normocephalic  Pulmonary: normal non-labored breathing, no rales, rhonchi, or wheezing  Cardiac: RRR, no detected murmurs, rubs or gallops; no carotid bruits  Abdomen: soft, NT, no palpable masses; well healed cholecystectomy scar.  Skin: without rashes, without ulcers  Vascular Exam/Pulses:   Right  Left   Radial  2+ palpable 2+ palpable  Femoral  3+ palpable 3+ palpable  Popliteal  3-4+ palpable 3-4+ palpable   DP  2+ palpable faintly palpable  PT  Unable to palpate  Unable to palpate    Extremities: without ischemic changes, without Gangrene , without cellulitis; without open wounds;  Musculoskeletal: no muscle wasting or atrophy. Enlarged and prominent base of right great toe metatarsal joint (pt reports site of gout exacerbations).  Neurologic: A&O X 3; Appropriate Affect ; SENSATION: normal; MOTOR FUNCTION: moving all extremities equally. Speech is fluent/normal; moderate tremor of right hand.    CTA abd/pelvis requested by Dr. Kathlene Cote 413/17: Vascular/Lymphatic: Grossly unchanged infrarenal abdominal aortic aneurysm measuring 3.7 x 3.5 cm, previously 3.9 x 3.4 cm. No retroperitoneal adenopathy.  Other: None.  Musculoskeletal: Lower thoracic and lumbar spine degenerative changes. No aggressive or acute appearing osseous lesions.  IMPRESSION: Indeterminate subpleural opacity within the right lower lobe which may represent infection, scarring and/or atelectasis however given the difference in appearance over time, neoplasm is not excluded. Recommend dedicated follow-up chest CT in 1 month. If this persists, a dedicated PET-CT could be considered.  Grossly unchanged cryoablation site within the lower pole of the right kidney. No evidence for localized recurrence or metastatic disease within the abdomen.  Unchanged bilateral renal cysts and chronic right UPJ obstruction.  Prior cholecystectomy with  unchanged biliary ductal dilatation.  Re- demonstrated infrarenal abdominal aortic aneurysm. Recommend followup by ultrasound in 2 years. This recommendation follows ACR consensus guidelines: White Paper of the ACR Incidental Findings Committee II on Vascular Findings. J Am Coll Radiol 2013; 10:789-794.    Pt meds includes:  Statin: Yes.  Beta Blocker: No.  Aspirin: No ACEI: No.  ARB: Yes.  Other Antiplatelet/Anticoagulant: Yes. Eliquis   Non-Invasive Vascular Imaging: (08/29/2015):    ABDOMINAL AORTA DUPLEX EVALUATION    INDICATION: Evaluation of known abdominal aortic aneurysm per CT scan 07/19/2014    PREVIOUS INTERVENTION(S):     DUPLEX EXAM:     LOCATION DIAMETER AP (cm) DIAMETER TRANSVERSE (cm) VELOCITIES (cm/sec)  Aorta Proximal 2.8 2.4 60  Aorta Mid 2.5 2.3 58  Aorta Distal 3.6 3.8 37  Right Common Iliac Artery 1.29 1.29 74  Left Common Iliac Artery 1.3 1.3 43    Previous max aortic diameter:  3.9 CM per CT Scan Date: 07/19/2014     ADDITIONAL FINDINGS: The proximal and mid aorta were not well visualized due to overlying bowel gas. Measurements are questionable.    IMPRESSION: Abdominal aortic aneurysm measuring 3.6 cm AP x 3.8 cm transverse.      Compared to the previous exam:  There are no previous ultrasound examinations available for comparison.      Medical Decision Making  The patient is a 82 y.o. male who presents with asymptomatic small AAA with no increase in size as noted by CTA abd/pelvis on 07/27/15:  grossly unchanged infrarenal abdominal aortic aneurysm measuring 3.7 x 3.5 cm, previously 3.9 x 3.4 cm (April, 2016).  He has prominent bilateral popliteal pulses, denies claudication symptoms with walking.  Will evaluate for popliteal aneurysms in 2-4 weeks by duplex.  I discussed with Dr. Kellie Simmering that pt states Dr. 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 His AAA duplex today  indicated the proximal and mid aorta were not well visualized due to overlying bowel gas. Measurements are questionable. Will see pt in a year  with no AAA duplex needed, to review results of his CTA abd/pelvis requested by Dr. Kathlene Cote in regards to his abdominal aortic aneurysm.   Consideration for repair of AAA would be made when the size is 5.5 cm, growth > 1 cm/yr, and symptomatic status.  I emphasized the importance of maximal medical management including strict control of blood pressure, blood glucose, and lipid levels, antiplatelet agents, obtaining regular exercise, and continued cessation of smoking.   The patient was given information about AAA including signs, symptoms, treatment, and how to minimize the risk of enlargement and rupture of aneurysms.    The patient was advised to call 911 should the patient experience sudden onset abdominal or back pain.   Thank you for allowing [place] to participate in this patient's care.  Clemon Chambers, RN, MSN, FNP-C [facility] [place] Office: [phone]  Clinic Physician: Kellie Simmering  08/29/2015, 9:20 AM

## 2015-09-05 ENCOUNTER — Ambulatory Visit: Payer: 59 | Attending: Primary Care | Admitting: Physical Therapy

## 2015-09-05 ENCOUNTER — Ambulatory Visit: Payer: 59

## 2015-09-05 ENCOUNTER — Ambulatory Visit: Payer: 59 | Admitting: Occupational Therapy

## 2015-09-05 DIAGNOSIS — R258 Other abnormal involuntary movements: Secondary | ICD-10-CM

## 2015-09-05 DIAGNOSIS — R471 Dysarthria and anarthria: Secondary | ICD-10-CM

## 2015-09-05 DIAGNOSIS — R2689 Other abnormalities of gait and mobility: Secondary | ICD-10-CM

## 2015-09-05 NOTE — Therapy (Signed)
[facility] [address] [place], [name], [address] Phone: [phone]   Fax:  [phone]  Patient Details  Name: William [name] MRN: [id number] Date of Birth: 1929/03/01 Referring Provider:  Star Age, MD  Encounter Date: 09/05/2015  Speech Therapy [name] Screen   Decibel Level today: 69dB  (WNL=70-72 dB) with sound level meter 30cm away from pt's mouth. Loudness was widely variable, but average was close to WNL. He has "not been religious" about keeping up with his loud "HEY!" reps. Pt's conversational volume has decreased since last treatment course.  Pt has not experienced difficulty in swallowing warranting objective evaluation. He does report cutting up meat into smaller bites.  Pt would benefit from speech-language eval for dysarthria. Pt would like to hold this until late summer, if referring MD agrees.   [facility] ,[place], [place]  09/05/2015, 1:39 PM  [facility] [address] [place], [name], [address] Phone: [phone]   Fax:  [phone]

## 2015-09-05 NOTE — Therapy (Signed)
[facility] [address] [place], [name], [address] Phone: [phone]   Fax:  [phone]  Patient Details  Name: William [name] MRN: [id number] Date of Birth: 1928-07-07 Referring Provider:  Pleas Koch, NP  Encounter Date: 09/05/2015   Physical Therapy [name] Screen   Timed Up and Go test:11.48 sec (compared to 9.56 sec last therapy bout)  10 meter walk test:9.58 sec (3.43 ft/sec)  5 time sit to stand test:12.36 sec (compared to 11.36 sec)   Patient does not require Physical Therapy services at this time.  Recommend Physical Therapy screen in 6-9 months.    Camrin Gearheart W. 09/05/2015, 1:48 PM  Frazier Butt., PT  [facility] [address] [place], [name], [address] Phone: [phone]   Fax:  [phone]

## 2015-09-05 NOTE — Therapy (Signed)
[facility] [address] [place], [name], [address] Phone: [phone]   Fax:  [phone]  Patient Details  Name: William [name] MRN: [id number] Date of Birth: [birth year] Referring Provider:  Pleas Koch, NP  Encounter Date: 09/05/2015 Occupational Therapy [name] Screen  Physical Performance Test item #2 (simulated eating):  17.29 sec  Physical Performance Test item #4 (donning/doffing jacket):  20.56 secs  9-hole peg test:    RUE  50.97 secs        LUE  41.19 secs sec  Change in ability to perform ADLs/IADLs:  Difficulty fastening buttons  Other Comments:  Mild decline in handwriting legibility/ size.  Pt would benefit from occupational therapy evaluation due to  Change in ADL and coordination. Therapist to request order from MD, pt wants to return to therapy in August/ Sept as he is traveling this summer.  Ronte Parker 09/05/2015, 1:21 PM  [facility] [address] [place], [name], [address] Phone: [phone]   Fax:  [phone]

## 2015-09-18 ENCOUNTER — Other Ambulatory Visit: Payer: Self-pay | Admitting: *Deleted

## 2015-09-18 DIAGNOSIS — R0989 Other specified symptoms and signs involving the circulatory and respiratory systems: Secondary | ICD-10-CM

## 2015-09-19 ENCOUNTER — Encounter (HOSPITAL_COMMUNITY): Payer: 59

## 2015-09-20 ENCOUNTER — Other Ambulatory Visit (HOSPITAL_COMMUNITY): Payer: Self-pay | Admitting: Interventional Radiology

## 2015-09-20 ENCOUNTER — Ambulatory Visit (HOSPITAL_COMMUNITY)
Admission: RE | Admit: 2015-09-20 | Discharge: 2015-09-20 | Disposition: A | Discharge status: Home / Self Care | Payer: Commercial Managed Care - HMO | Source: Ambulatory Visit | Attending: Vascular Surgery | Admitting: Vascular Surgery

## 2015-09-20 DIAGNOSIS — E785 Hyperlipidemia, unspecified: Secondary | ICD-10-CM | POA: Insufficient documentation

## 2015-09-20 DIAGNOSIS — R9389 Abnormal findings on diagnostic imaging of other specified body structures: Secondary | ICD-10-CM

## 2015-09-20 DIAGNOSIS — I10 Essential (primary) hypertension: Secondary | ICD-10-CM | POA: Diagnosis not present

## 2015-09-20 DIAGNOSIS — R0989 Other specified symptoms and signs involving the circulatory and respiratory systems: Secondary | ICD-10-CM | POA: Diagnosis not present

## 2015-09-20 DIAGNOSIS — R938 Abnormal findings on diagnostic imaging of other specified body structures: Secondary | ICD-10-CM | POA: Diagnosis not present

## 2015-09-21 ENCOUNTER — Encounter: Payer: Self-pay | Admitting: Family

## 2015-09-21 ENCOUNTER — Other Ambulatory Visit: Payer: Self-pay

## 2015-09-21 DIAGNOSIS — G20A1 Parkinson's disease without dyskinesia, without mention of fluctuations: Secondary | ICD-10-CM

## 2015-09-21 DIAGNOSIS — G20 Parkinson's disease: Secondary | ICD-10-CM

## 2015-09-26 ENCOUNTER — Ambulatory Visit (HOSPITAL_COMMUNITY)
Admission: RE | Admit: 2015-09-26 | Discharge: 2015-09-26 | Disposition: A | Discharge status: Home / Self Care | Payer: Commercial Managed Care - HMO | Source: Ambulatory Visit | Attending: Family | Admitting: Family

## 2015-09-26 ENCOUNTER — Encounter: Payer: Self-pay | Admitting: Family

## 2015-09-26 ENCOUNTER — Ambulatory Visit (INDEPENDENT_AMBULATORY_CARE_PROVIDER_SITE_OTHER): Payer: Commercial Managed Care - HMO | Admitting: Family

## 2015-09-26 VITALS — BP 115/66 | HR 50 | Temp 96.9°F | Resp 16 | Ht 75.0 in | Wt 178.0 lb

## 2015-09-26 DIAGNOSIS — I714 Abdominal aortic aneurysm, without rupture, unspecified: Secondary | ICD-10-CM

## 2015-09-26 DIAGNOSIS — R0989 Other specified symptoms and signs involving the circulatory and respiratory systems: Secondary | ICD-10-CM | POA: Diagnosis not present

## 2015-09-26 NOTE — Progress Notes (Signed)
[facility] [place]   CC: Evaluation of prominent popliteal pulses  History of Present Illness William [name] is a 82 y.o. male patient of Dr. Trula Slade who has been treated by Dr. 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 His CTA for follow up revealed a 3.9cm AAA. Pt states Dr. 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  Pt returns today for duplex evaluation of prominent popliteal pulses noted on physical exam in May 2017. He denies claudication symptoms with walking.  He smoked for about 10 years, quit in the 1960's.   In April or May or 2016 he was in the ER for atrial fibrillation. His HTN medications have been tweaked for better control. He follows up with Dr. Percival Spanish for this. He also has [name] and he states that he was on a medication for this that could cause hypertension over time. He has discontinued this medication and he states his blood pressure has been under better control.  He also had a TIA in April or May of 2016 in which he states he was reading an article on the computer and couldn't comprehend what he was reading. When he was trying to speak, he was unable to and 911 was called. By the time the responder arrived, his symptoms had resolved.  Pt and wife deny any subsequent TIA or stroke.   He is on a statin for his cholesterol. He is on an ARB for his HTN. He is also on an aspirin. He does take Eliquis due to his history of Afib, states he has not has any subsequent known atrial fib.     Past Medical History  Diagnosis Date  . Hyperlipidemia   . Hx of colonic polyps   . Benign prostatic hypertrophy with urinary obstruction   . TIA (transient ischemic attack)     pt does not recall this hx "but may have had one today" (08/09/2014)  . Subdural hematoma ([place]) 2006  . Gout   . Hypertension     sees [name]  . Thyroid disease     medullary thyroid   . Pneumonia     hx of in 1952  . Parkinson disease ([place])   . A-fib ([place])   . Complication of anesthesia     "difficulty intubation, had to use fiberoptic 2006"  . Difficult intubation   . 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 Primary skin squamous cell carcinoma   . Basal cell carcinoma   . Hypothyroidism   . DJD (degenerative joint disease)   . Arthritis     "right knee" (08/09/2014)  . Gout     "periodically" (08/09/2014)  . Kidney tumor     "tumor on cyst on kidney"   . DJD (degenerative joint disease)   . Parkinson disease ([place])   . Mini stroke [facility])     Social History Social History  Substance Use Topics  . Smoking status: Former Smoker -- 0.80 packs/day for 15 years    Types: Cigarettes    Quit date: 04/15/1965  . Smokeless tobacco: Never Used  . Alcohol Use: 6.0 oz/week    0 Standard drinks or equivalent, 10 Glasses of wine per week     Comment: 1 glass of wine every night (6oz glass)    Family History Family History  Problem Relation Age of Onset  .  Stroke Mother   . Alzheimer's disease Brother   . Stroke Father   . Heart disease Father   . Alzheimer's disease Sister     Past Surgical History  Procedure Laterality Date  . Cataract extraction w/ intraocular lens  implant, bilateral Bilateral ~ 1998  . Cholecystectomy    . Thyroidectomy      medullary carcinoma  . Eye surgery    . Knee arthroscopy Right   . Appendectomy    . Tonsillectomy    . Lumbar laminectomy/decompression microdiscectomy  05/14/2012    Procedure: LUMBAR LAMINECTOMY/DECOMPRESSION MICRODISCECTOMY 1 LEVEL;  Surgeon: [name], MD;  Location: [facility];  Service: Neurosurgery;  Laterality: Left;  Left Lumbar four-five Laminectomy/Diskectomy/Far lateral diskectomy  . Cryoablation  07-10-12    "tumor on cyst on my kidney; had an ablation"  . Skin cancer excision  "several"    "back of my neck; right  eye; clavicle; nose"  . Inguinal hernia repair Right   . Back surgery    . Subdural hematoma evacuation via craniotomy  2006  . Kidney surgery      No Known Allergies  Current Outpatient Prescriptions  Medication Sig Dispense Refill  . apixaban (ELIQUIS) 2.5 MG TABS tablet Take 1 tablet (2.5 mg total) by mouth 2 (two) times daily. 180 tablet 3  . carbidopa-levodopa (SINEMET IR) 25-100 MG tablet Take 1 tablet by mouth 4 (four) times daily. At 8, 12, 5 PM, and 9 or 10 PM 360 tablet 3  . hydrALAZINE (APRESOLINE) 25 MG tablet Take 1 tablet (25 mg total) by mouth 3 (three) times daily. 90 tablet 3  . levothyroxine (SYNTHROID, LEVOTHROID) 100 MCG tablet TAKE 1 TABLET EVERY DAY BEFORE BREAKFAST 90 tablet 0  . losartan-hydrochlorothiazide (HYZAAR) 100-12.5 MG tablet Take 1 tablet by mouth daily. 90 tablet 3  . Multiple Vitamin (MULTIVITAMIN WITH MINERALS) TABS Take 1 tablet by mouth daily. Reported on 08/01/2015    . Probiotic Product (ALIGN) 4 MG CAPS Take 1 capsule by mouth daily.    . rasagiline (AZILECT) 1 MG TABS tablet Take 1 tablet (1 mg total) by mouth daily. 90 tablet 3  . simvastatin (ZOCOR) 20 MG tablet Take 1 tablet (20 mg total) by mouth at bedtime. 90 tablet 3  . [DISCONTINUED] losartan (COZAAR) 50 MG tablet Take 1 tablet (50 mg total) by mouth daily. 30 tablet 1   No current facility-administered medications for this visit.    ROS: See HPI for pertinent positives and negatives.   Physical Examination  Filed Vitals:   09/26/15 1355  BP: 115/66  Pulse: 50  Temp: 96.9 F (36.1 C)  Resp: 16  Height: 6\' 3"  (1.905 m)  Weight: 178 lb (80.74 kg)  SpO2: 95%   Body mass index is 22.25 kg/(m^2).  General: WDWN in NAD  Gait: Normal  HENT: WNL, normocephalic  Pulmonary: normal non-labored breathing Cardiac: RRR, no detected murmurs, rubs or gallops; no carotid bruits  Abdomen: soft, NT, no palpable masses; well healed cholecystectomy scar.  Skin: without rashes,  without ulcers  Vascular Exam/Pulses:   Right  Left   Radial  2+ palpable 2+ palpable  Femoral  3+ palpable 3+ palpable  Popliteal  3+ palpable 3+ palpable   DP  2+ palpable faintly palpable  PT  Unable to palpate  Unable to palpate    Extremities: without ischemic changes, without Gangrene , without cellulitis; without open wounds;  Musculoskeletal: no muscle wasting or atrophy. Enlarged and prominent base of right  great toe metatarsal joint (pt reports site of gout exacerbations).  Neurologic: A&O X 3; Appropriate Affect ; SENSATION: normal; MOTOR FUNCTION: moving all extremities equally. Speech is fluent/normal; moderate tremor of right hand.    CTA abd/pelvis requested by Dr. Kathlene Cote 413/17: Vascular/Lymphatic: Grossly unchanged infrarenal abdominal aortic aneurysm measuring 3.7 x 3.5 cm, previously 3.9 x 3.4 cm. No retroperitoneal adenopathy.  Other: None.  Musculoskeletal: Lower thoracic and lumbar spine degenerative changes. No aggressive or acute appearing osseous lesions.  IMPRESSION: Indeterminate subpleural opacity within the right lower lobe which may represent infection, scarring and/or atelectasis however given the difference in appearance over time, neoplasm is not excluded. Recommend dedicated follow-up chest CT in 1 month. If this persists, a dedicated PET-CT could be considered.  Grossly unchanged cryoablation site within the lower pole of the right kidney. No evidence for localized recurrence or metastatic disease within the abdomen.  Unchanged bilateral renal cysts and chronic right UPJ obstruction.  Prior cholecystectomy with unchanged biliary ductal dilatation.  Re- demonstrated infrarenal abdominal aortic aneurysm. Recommend followup by ultrasound in 2 years. This recommendation follows ACR consensus guidelines: White Paper of the ACR Incidental Findings Committee II on Vascular Findings. J Am Coll Radiol  2013; 10:789-794.    Pt meds includes:  Statin: Yes.  Beta Blocker: No.  Aspirin: No ACEI: No.  ARB: Yes.  Other Antiplatelet/Anticoagulant: Yes. Eliquis         Non-Invasive Vascular Imaging: DATE: 09/26/2015 LOWER EXTREMITY ARTERIAL DUPLEX EVALUATION      INDICATION: Prominent popliteal pulses.    PREVIOUS INTERVENTION(S): None    DUPLEX EXAM:     Popliteal Artery Diameter   AP (cm) Transverse (cm)  Right 1.4 1.5  Left 1.2 1.4     ADDITIONAL FINDINGS:     IMPRESSION: No focal popliteal aneurysm visualized bilaterally, largest diameter noted above      ASSESSMENT: William [name] is a 82 y.o. male who presents with asymptomatic small AAA with no increase in size as noted by CTA abd/pelvis on 07/27/15: grossly unchanged infrarenal abdominal aortic aneurysm measuring 3.7 x 3.5 cm, previously 3.9 x 3.4 cm (April, 2016).  He has prominent bilateral popliteal pulses, denies claudication symptoms with walking.  Bilateral popliteal artery Duplex today indicates no focal popliteal aneurysm visualized bilaterally.   I discussed with Dr. Kellie Simmering at pt's May 2017 visit that pt states Dr. 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 His AAA duplex today indicated the proximal and mid aorta were not well visualized due to overlying bowel gas. Measurements are questionable. Will see pt about May 2018, no AAA duplex needed, to review results of his CTA abd/pelvis requested by Dr. Kathlene Cote in regards to his abdominal aortic aneurysm.    PLAN:  Will see pt about May 2018, no AAA duplex needed, to review results of his CTA abd/pelvis requested by Dr. Kathlene Cote in regards to his abdominal aortic aneurysm.    Clemon Chambers, RN, MSN, FNP-C [facility] [place] [facility] Phone: [phone]  Clinic MD: Trula Slade  09/26/2015 2:00 PM

## 2015-10-04 ENCOUNTER — Other Ambulatory Visit (HOSPITAL_COMMUNITY): Payer: Self-pay | Admitting: Interventional Radiology

## 2015-10-04 ENCOUNTER — Other Ambulatory Visit: Payer: Self-pay | Admitting: Radiology

## 2015-10-04 DIAGNOSIS — N2889 Other specified disorders of kidney and ureter: Secondary | ICD-10-CM

## 2015-10-04 DIAGNOSIS — R918 Other nonspecific abnormal finding of lung field: Secondary | ICD-10-CM

## 2015-10-06 ENCOUNTER — Other Ambulatory Visit: Payer: Self-pay | Admitting: Primary Care

## 2015-10-06 DIAGNOSIS — E039 Hypothyroidism, unspecified: Secondary | ICD-10-CM

## 2015-10-07 NOTE — Telephone Encounter (Signed)
Last office visit 06/15/2015.  Last TSH checked 08/23/2014.  Refill?

## 2015-10-08 MED ORDER — LEVOTHYROXINE SODIUM 100 MCG PO TABS: ORAL_TABLET | ORAL | Status: DC

## 2015-10-09 NOTE — Telephone Encounter (Signed)
Message left for patient to return my call to schedule lab appt for TSH.

## 2015-10-13 NOTE — Telephone Encounter (Signed)
Message left for patient to return my call.  

## 2015-10-26 ENCOUNTER — Ambulatory Visit (INDEPENDENT_AMBULATORY_CARE_PROVIDER_SITE_OTHER): Payer: Commercial Managed Care - HMO | Admitting: Neurology

## 2015-10-26 ENCOUNTER — Encounter: Payer: Self-pay | Admitting: Neurology

## 2015-10-26 VITALS — BP 113/60 | HR 67 | Wt 178.0 lb

## 2015-10-26 DIAGNOSIS — G20 Parkinson's disease: Secondary | ICD-10-CM | POA: Diagnosis not present

## 2015-10-26 DIAGNOSIS — I480 Paroxysmal atrial fibrillation: Secondary | ICD-10-CM | POA: Diagnosis not present

## 2015-10-26 NOTE — Patient Instructions (Signed)
We will have you try the [place] steady boxing class.  We will continue with your sinemet and the Azilect at the current doses.  Try to drink more water, avoid heat exposure.

## 2015-10-26 NOTE — Progress Notes (Signed)
Subjective:    Patient ID: William [name] is a 82 y.o. male.  HPI     Interim history:   Mr. Radigan is an 82-year-old right-handed gentleman with an underlying medical history of right TIA in January 2003, SDH (s/p left craniotomy in August 2006), hypertension, hypothyroidism, thyroid cancer, partial onset seizures (off Dilantin), lumbar spine disease, status post lower back surgery at L4-5 in January 2014, renal tumor, status post kidney tumor ablative surgery in March 2014, who presents for followup consultation of his right-sided predominant [name]. He is accompanied by [name] (longterm GF) again today. I last saw him on 06/22/2015, at which time he reported doing fairly well. He had finished outpatient OT, PT, and ST. Unfortunately, he did take a fall in the garage couple of months prior as he was coming in from the graduate, stepped backwards on the stairs and slipped off falling backwards, landed on his behind, head struck the car, he denied loss of consciousness or headache or bruise and no sequelae were reported. He was having rails installed in the garage entrance. Was still trying to stay active, playing golf. We mutually agreed to continue with Sinemet 4 times a day and Azilect once daily. We talked about fall risk and gait safety and fall precautions.   Today, 10/26/2015: He reports overall doing quite well, has had some stumbling episodes, no actual falls, tries to drink enough water and actually likes drinking water but sometimes is not as good. He tries to golf but avoids that he. He has signed up for rock steady boxing class and has had one session. [name] reports that she noticed he was too exhausted. Part of the exercise includes rolling the heavy punching bag and this was a little much for him. He has not had any symptoms of A. fib. He has had one episode of A. fib as far as he knows. He continues with his blood pressure medication as well as his Eliquis.    Previously:   I saw him on 02/23/2015, at which time he reported doing fairly well. He was playing golf regularly. His right knee was bothering him. He had no recent falls. He was trying to hydrate well enough. He had no new issues with A. fib and no new complaints otherwise. I noted right knee swelling. He was advised to follow-up with his primary care physician for this. We mutually agreed to keep his Parkinson's medications the same.   I saw him on 11/01/2014 at which time he reported a recent diagnosis with A. fib. He was admitted to the hospital in April. I reviewed the hospital records from 07/17/2014 through 07/18/2014. He was started on Xarelto. He was then re-admitted on 08/09/14 to 08/10/14 due to altered mental status and slurring of speech and was suspected to have a TIA. He was seen by neuro in consultation and a baby aspirin was added to Xarelto. He presented back to the ER on 08/18/14. He a CTH wo contrast on  08/18/2014 , which was negative for any acute changes. He was seen by the Neurologist in the emergency room and it was felt that a full TIA workup was not necessary at the time. He had presented with hypertension and some slurring of speech. He was switched from Xarelto to Eliquis, because the [facility].  He endorsed recent stressors, including the recent passing of his younger brother a week prior with advanced Alzheimer's disease.   He had an MRI   brain and MRA head on 08/09/14: MRI HEAD: No acute intracranial process, specifically no acute ischemia. Involutional changes. Mild white matter changes suggest chronic small vessel ischemic disease. MRA HEAD: No acute large vessel occlusion or high-grade stenosis. Mid grade stenosis RIGHT P2/3 segment.   I suggested we continue with Sinemet at 4 times a day. He was advised to continue with Azilect as well. He was advised to drink more water. In the interim, we restarted outpatient physical therapy, occupational  therapy and speech therapy.  I saw him on 04/29/2014, at which time he reported doing well overall but he was more fatigued and had some excessive daytime somnolence. Memory was stable. His mood was stable. He noted some blood pressure fluctuations. Sometimes he had a dull headache and sometimes he felt lightheaded. He had some anxiety over his lady friend's health, as she had fallen and broken rib. He continued to walk regularly and played golf. He had no recent falls. I asked him to continue with his medications, Azilect once daily and Sinemet 4 times a day.  I saw him on 10/28/2013, at which time he reported doing well. In particular, had no cognitive complaints, no mood issues, and continued to play golf 3 times a week. He was driving well. I suggested an increase in his levodopa to 1 pill 4 times a day of the 25-100 milligrams strength. [facility] and had [facility] as [facility].    I saw him on 12/07/2012, at which time I felt that he was doing well on Azilect and levodopa. He called in December 2014 with problems with blood pressure fluctuations. He was wondering if this came from the Azilect but I did not think it was due to the Azilect per se. I was reluctant to take him off of it.   I first met him on 06/03/2012 and he previously followed by Dr. James Love. He has had primarily right-sided symptoms with regards to his Parkinson's, diagnosed in 2010 with symptoms dating back to late 2009 or early 2010. He had briefly tried Mirapex but was taken off d/t hypotension. L spine MRI in June 2013 showed renal cysts, degenerative joint disease most prominent at L4-5. He tried acupuncture. His MRI of the lumbar spine showed abnormalities with his kidney with a cyst and a tumor and he had ablative surgery on 07/10/12 and had a FU CT done. He had lower back surgery on 05/14/12.  I saw him back on 09/03/2012 and I suggested starting Azilect. He has been tolerating it  well and both he and his girlfriend felt that he did better with it in terms of dexterity and fine motor control. He had some hypotension and lightheadedness and reduced his BP medication. He has been having some issue with gout.     His Past Medical History Is Significant For: Past Medical History  Diagnosis Date  . Hyperlipidemia   . Hx of colonic polyps   . Benign prostatic hypertrophy with urinary obstruction   . TIA (transient ischemic attack)     pt does not recall this hx "but may have had one today" (08/09/2014)  . Subdural hematoma (HCC) 2006  . Gout   . Hypertension     sees Dr. Scott Nadel  . Thyroid disease     medullary thyroid   . Pneumonia     hx of in 1952  . Parkinson disease (HCC)   . A-fib (HCC)   . Complication   of anesthesia     "difficulty intubation, had to use fiberoptic 2006"  . Difficult intubation   . 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 Primary skin squamous cell carcinoma   . Basal cell carcinoma   . Hypothyroidism   . DJD (degenerative joint disease)   . Arthritis     "right knee" (08/09/2014)  . Gout     "periodically" (08/09/2014)  . Kidney tumor     "tumor on cyst on kidney"   . DJD (degenerative joint disease)   . Parkinson disease (HCC)   . Mini stroke (HCC)     His Past Surgical History Is Significant For: Past Surgical History  Procedure Laterality Date  . Cataract extraction w/ intraocular lens  implant, bilateral Bilateral ~ 1998  . Cholecystectomy    . Thyroidectomy      medullary carcinoma  . Eye surgery    . Knee arthroscopy Right   . Appendectomy    . Tonsillectomy    . Lumbar laminectomy/decompression microdiscectomy  05/14/2012    Procedure: LUMBAR LAMINECTOMY/DECOMPRESSION MICRODISCECTOMY 1 LEVEL;  Surgeon: James R Hirsch, MD;  Location: [facility];  Service: Neurosurgery;  Laterality: Left;  Left Lumbar four-five Laminectomy/Diskectomy/Far lateral diskectomy  . Cryoablation  07-10-12     "tumor on cyst on my kidney; had an ablation"  . Skin cancer excision  "several"    "back of my neck; right eye; clavicle; nose"  . Inguinal hernia repair Right   . Back surgery    . Subdural hematoma evacuation via craniotomy  2006  . Kidney surgery      His Family History Is Significant For: Family History  Problem Relation Age of Onset  . Stroke Mother   . Alzheimer's disease Brother   . Stroke Father   . Heart disease Father   . Alzheimer's disease Sister     His Social History Is Significant For: Social History   Social History  . Marital Status: Widowed    Spouse Name: N/A  . Number of Children: 3  . Years of Education: 15   Occupational History  . retired    Social History Main Topics  . Smoking status: Former Smoker -- 0.80 packs/day for 15 years    Types: Cigarettes    Quit date: 04/15/1965  . Smokeless tobacco: Never Used  . Alcohol Use: 6.0 oz/week    10 Glasses of wine, 0 Standard drinks or equivalent per week     Comment: 1 glass of wine every night (6oz glass)  . Drug Use: No  . Sexual Activity: Yes   Other Topics Concern  . None   Social History Narrative   Married.   Lives at home with significant other.   Retried. 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   Enjoys golfing, yard work, spending time with family.         His Allergies Are:  No Known Allergies:   His Current Medications Are:  Outpatient Encounter Prescriptions as of 10/26/2015  Medication Sig  . apixaban (ELIQUIS) 2.5 MG TABS tablet Take 1 tablet (2.5 mg total) by mouth 2 (two) times daily.  . carbidopa-levodopa (SINEMET IR) 25-100 MG tablet Take 1 tablet by mouth 4 (four) times daily. At 8, 12, 5 PM, and 9 or 10 PM  . hydrALAZINE (APRESOLINE) 25 MG tablet Take 1 tablet (25 mg total) by mouth 3 (three) times daily.  . levothyroxine (SYNTHROID, LEVOTHROID) 100 MCG tablet TAKE 1   TABLET EVERY DAY BEFORE BREAKFAST  . losartan-hydrochlorothiazide (HYZAAR) 100-12.5 MG tablet Take 1  tablet by mouth daily.  . Multiple Vitamin (MULTIVITAMIN WITH MINERALS) TABS Take 1 tablet by mouth daily. Reported on 08/01/2015  . Probiotic Product (ALIGN) 4 MG CAPS Take 1 capsule by mouth daily.  . rasagiline (AZILECT) 1 MG TABS tablet Take 1 tablet (1 mg total) by mouth daily.  . simvastatin (ZOCOR) 20 MG tablet Take 1 tablet (20 mg total) by mouth at bedtime.   No facility-administered encounter medications on file as of 10/26/2015.  :  Review of Systems:  Out of a complete 14 point review of systems, all are reviewed and negative with the exception of these symptoms as listed below:   Review of Systems  Patient denies new problems, states he has not fallen but had "near misses". He is accompanied by his significant other, [name]. Objective:  Neurologic Exam  Physical Exam Physical Examination:   Filed Vitals:   10/26/15 0929  BP: 113/60  Pulse: 67   General Examination: The patient is a very pleasant 82 y.o. male in no acute distress. He has no dizziness or lightheadedness upon standing. He is in good spirits today, as usual.  HEENT: He has moderate degree of nuchal rigidity with some decrease in passive range of motion. He reports no neck pain. Face is symmetric with decrease in facial animation. Speech is moderately hypophonic and mildly dysarthric. Hearing is grossly intact. Extraocular tracking is fairly good with mild saccadic breakdown. Pupils are reactive to light. Airway is clear with mild to moderate mouth dryness noted.  Chest is clear to auscultation without wheezing, or rhonchi or crackles noted.  Heart sounds are normal without murmurs, rubs or gallops.  Abdomen is soft, nontender with normal bowel sounds.  He has no pitting edema in the distal lower extremities, denies knee pain. Skin is warm and dry, skin turgor a little low.  Neurologically: Mental status: The patient is awake, alert and oriented in all 4 spheres. His memory, attention, language and knowledge  are appropriate. He has no aphasia, agnosia, apraxia or anomia. Cranial nerves are as described under HEENT exam. Motor exam: Normal bulk and strength is noted. He has increased tone in the right upper extremity and to a lesser degree in the right lower extremity. Mild cogwheeling is noted. He has a moderate constant resting tremor in the right upper extremity only. He has no other tremor. He has a mild postural tremor today.  Fine motor skills with finger taps and hand movements are moderately impaired on the right and mild to moderately impaired on the left. He stands up with mild difficulty from the seated position and has to push himself up. He has a mildly stooped posture. He walks with decreased arm swing and a little bit more caution today. Balance is slightly affected. Sensory exam is intact to light touch. He turns with mild insecurity. Romberg is negative. Reflexes are 1+ throughout.there is no evidence of dyskinesias. Sensory exam is intact to light touch throughout.  Assessment and Plan:   In summary, Mr. Peddie is a very pleasant 82 year old gentleman with right-sided predominant, tremor predominant [name], in its 7 th+ year with symptoms dating back to late 2009 or early 2010 with diagnosis made in 2010 and he followed for years with Dr. Erling Cruz. He has a complex medical history and is status post lower back surgery at level L4/5 in January 2014, and kidney tumor ablative surgery in March 2014 and fairly  recent diagnosis of atrial fibrillation in April 2016 with an admission in early April 2016 as well as TIA diagnosis in late April 2016. He is currently on Eliquis. He has had no further episodes of A. Fib, as far as he can tell and he regularly sees his cardiologist with appointment pending for next month. He had an ER visit in early May 2016 due to hypertension and slurring of speech. Parkinson's symptoms and exam are fairly stable but balance and gait are slightly worse on my  exam and per history. He has signed up for boxing classes twice a week. I filled out the form for this. Nevertheless, I did caution about lifting, rolling or pushing heavy objects and reminded him not to exercise to the point of exhaustion. He is also encouraged to talk to his cardiologist about this.  For now, we will keep the Azilect at once daily and Sinemet 1 pill 4 times a day. There is room to increase the levodopa if needed but with caution. For now, we will continue with the current medication regimen and he would like to do his exercise classes more consistently in the next few months and weeks. I would like to see him back in 4 months, sooner as needed. I answered all their questions today and the patient and [name] were in agreement. [facility] He did not need a refill today. I spent 25 minutes in total face-to-face time with the patient, more than 50% of which was spent in counseling and coordination of care, reviewing test results, reviewing medication and discussing or reviewing the diagnosis of PD, the prognosis and treatment options. 

## 2015-11-03 DIAGNOSIS — N289 Disorder of kidney and ureter, unspecified: Secondary | ICD-10-CM | POA: Diagnosis not present

## 2015-11-03 DIAGNOSIS — N2889 Other specified disorders of kidney and ureter: Secondary | ICD-10-CM | POA: Diagnosis not present

## 2015-11-04 LAB — CREATININE WITH EST GFR
Creat: 1.63 mg/dL — ABNORMAL HIGH (ref 0.70–1.11)
GFR, EST AFRICAN AMERICAN: 44 mL/min — AB (ref >=60)
GFR, Est Non African American: 38 mL/min — ABNORMAL LOW (ref >=60)

## 2015-11-04 LAB — BUN: BUN: 23 mg/dL (ref 7–25)

## 2015-11-09 ENCOUNTER — Encounter (HOSPITAL_COMMUNITY): Payer: Self-pay

## 2015-11-09 ENCOUNTER — Ambulatory Visit
Admission: RE | Admit: 2015-11-09 | Discharge: 2015-11-09 | Disposition: A | Discharge status: Home / Self Care | Payer: Commercial Managed Care - HMO | Source: Ambulatory Visit | Attending: Interventional Radiology | Admitting: Interventional Radiology

## 2015-11-09 ENCOUNTER — Ambulatory Visit (HOSPITAL_COMMUNITY)
Admission: RE | Admit: 2015-11-09 | Discharge: 2015-11-09 | Disposition: A | Discharge status: Home / Self Care | Payer: Commercial Managed Care - HMO | Source: Ambulatory Visit | Attending: Interventional Radiology | Admitting: Interventional Radiology

## 2015-11-09 DIAGNOSIS — J479 Bronchiectasis, uncomplicated: Secondary | ICD-10-CM | POA: Insufficient documentation

## 2015-11-09 DIAGNOSIS — R918 Other nonspecific abnormal finding of lung field: Secondary | ICD-10-CM | POA: Diagnosis not present

## 2015-11-09 DIAGNOSIS — I700 Atherosclerosis of aorta: Secondary | ICD-10-CM | POA: Insufficient documentation

## 2015-11-09 DIAGNOSIS — N281 Cyst of kidney, acquired: Secondary | ICD-10-CM | POA: Diagnosis not present

## 2015-11-09 DIAGNOSIS — I2510 Atherosclerotic heart disease of native coronary artery without angina pectoris: Secondary | ICD-10-CM | POA: Diagnosis not present

## 2015-11-09 DIAGNOSIS — I712 Thoracic aortic aneurysm, without rupture: Secondary | ICD-10-CM | POA: Insufficient documentation

## 2015-11-09 DIAGNOSIS — I714 Abdominal aortic aneurysm, without rupture: Secondary | ICD-10-CM | POA: Diagnosis not present

## 2015-11-09 HISTORY — PX: IR GENERIC HISTORICAL: IMG1180011

## 2015-11-09 MED ORDER — IOPAMIDOL (ISOVUE-300) INJECTION 61%
75.0000 mL | Freq: Once | INTRAVENOUS | Status: AC | PRN
  Administered 2015-11-09: 50 mL via INTRAVENOUS

## 2015-11-09 NOTE — Progress Notes (Signed)
Chief Complaint: Follow-up of abnormality at the right lung base.  History of Present Illness: ALEKSANDR PELLOW is a 82 y.o. male status post cryoablation of a right oncocytic renal neoplasm on 07/10/2012. During follow-up imaging, note has been made of gradual enlargement of a plaque-like area of parenchymal nodularity and consolidation in the subpleural posterior right lower lobe. Formal CT evaluation of the chest was performed earlier today. He occasionally has a cough but denies any productive cough, hemoptysis or fever.   Past Medical History:  Diagnosis Date  . A-fib ([place])   . Arthritis    "right knee" (08/09/2014)  . Basal cell carcinoma   . Benign prostatic hypertrophy with urinary obstruction   . Complication of anesthesia    "difficulty intubation, had to use fiberoptic 2006"  . Difficult intubation   . DJD (degenerative joint disease)   . DJD (degenerative joint disease)   . Gout   . Gout    "periodically" (08/09/2014)  . Hx of colonic polyps   . Hyperlipidemia   . Hypertension    sees [name]  . Hypothyroidism   . Kidney tumor    "tumor on cyst on kidney"   . 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 Mini stroke ([place])   . Parkinson disease ([place])   . Parkinson disease ([place])   . Pneumonia    hx of in 1952  . Primary skin squamous cell carcinoma   . Subdural hematoma ([place]) 2006  . Thyroid disease    medullary thyroid   . TIA (transient ischemic attack)    pt does not recall this hx "but may have had one today" (08/09/2014)    Past Surgical History:  Procedure Laterality Date  . APPENDECTOMY    . BACK SURGERY    . CATARACT EXTRACTION W/ INTRAOCULAR LENS  IMPLANT, BILATERAL Bilateral ~ 1998  . CHOLECYSTECTOMY    . CRYOABLATION  07-10-12   "tumor on cyst on my kidney; had an ablation"  . EYE SURGERY    . INGUINAL HERNIA REPAIR Right   . KIDNEY SURGERY    . KNEE ARTHROSCOPY Right   . LUMBAR LAMINECTOMY/DECOMPRESSION  MICRODISCECTOMY  05/14/2012   Procedure: LUMBAR LAMINECTOMY/DECOMPRESSION MICRODISCECTOMY 1 LEVEL;  Surgeon: [name], MD;  Location: [facility];  Service: Neurosurgery;  Laterality: Left;  Left Lumbar four-five Laminectomy/Diskectomy/Far lateral diskectomy  . SKIN CANCER EXCISION  "several"   "back of my neck; right eye; clavicle; nose"  . SUBDURAL HEMATOMA EVACUATION VIA CRANIOTOMY  2006  . THYROIDECTOMY     medullary carcinoma  . TONSILLECTOMY      Allergies: Review of patient's allergies indicates no known allergies.  Medications: Prior to Admission medications   Medication Sig Start Date End Date Taking? Authorizing Provider  apixaban (ELIQUIS) 2.5 MG TABS tablet Take 1 tablet (2.5 mg total) by mouth 2 (two) times daily. 10/06/14   Minus Breeding, MD  carbidopa-levodopa (SINEMET IR) 25-100 MG tablet Take 1 tablet by mouth 4 (four) times daily. At 8, 12, 5 PM, and 9 or 10 PM 02/23/15   Star Age, MD  hydrALAZINE (APRESOLINE) 25 MG tablet Take 1 tablet (25 mg total) by mouth 3 (three) times daily. 06/15/15   Pleas Koch, NP  levothyroxine (SYNTHROID, LEVOTHROID) 100 MCG tablet TAKE 1 TABLET EVERY DAY BEFORE BREAKFAST 10/08/15   Pleas Koch, NP  losartan-hydrochlorothiazide (HYZAAR) 100-12.5 MG tablet Take 1 tablet by mouth daily. 06/15/15  Pleas Koch, NP  Multiple Vitamin (MULTIVITAMIN WITH MINERALS) TABS Take 1 tablet by mouth daily. Reported on 08/01/2015    Historical Provider, MD  Probiotic Product (ALIGN) 4 MG CAPS Take 1 capsule by mouth daily.    Historical Provider, MD  rasagiline (AZILECT) 1 MG TABS tablet Take 1 tablet (1 mg total) by mouth daily. 02/23/15   Star Age, MD  simvastatin (ZOCOR) 20 MG tablet Take 1 tablet (20 mg total) by mouth at bedtime. 06/15/15   Pleas Koch, NP     Family History  Problem Relation Age of Onset  . Stroke Mother   . Stroke Father   . Heart disease Father   . Alzheimer's disease Brother   . Alzheimer's disease  Sister     Social History   Social History  . Marital status: Widowed    Spouse name: N/A  . Number of children: 3  . Years of education: 15   Occupational History  . retired    Social History Main Topics  . Smoking status: Former Smoker    Packs/day: 0.80    Years: 15.00    Types: Cigarettes    Quit date: 04/15/1965  . Smokeless tobacco: Never Used  . Alcohol use 6.0 oz/week    10 Glasses of wine per week     Comment: 1 glass of wine every night (6oz glass)  . Drug use: No  . Sexual activity: Yes   Other Topics Concern  . Not on file   Social History Narrative   Married.   Lives at home with significant other.   Retried. 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   Enjoys golfing, yard work, spending time with family.         Review of Systems: A 12 point ROS discussed and pertinent positives are indicated in the HPI above.  All other systems are negative.  Review of Systems  Constitutional: Negative.   HENT: Negative.   Respiratory: Negative for chest tightness, shortness of breath and wheezing.        Occasional cough  Cardiovascular: Negative.   Gastrointestinal: Negative.   Genitourinary: Negative.   Musculoskeletal: Negative.   Neurological: Negative.     Vital Signs: BP 138/70 (BP Location: Left Arm, Patient Position: Sitting, Cuff Size: Normal)   Pulse 62   Temp 97.8 F (36.6 C) (Oral)   Resp 15   Ht 6' 4"  (1.93 m)   Wt 180 lb (81.6 kg)   SpO2 98%   BMI 21.91 kg/m   Physical Exam  Constitutional: He is oriented to person, place, and time. He appears well-developed and well-nourished. No distress.  Pulmonary/Chest: Effort normal.  Neurological: He is alert and oriented to person, place, and time.  Skin: He is not diaphoretic.  Nursing note and vitals reviewed.   Imaging: Ct Chest W Contrast  Result Date: 11/09/2015 CLINICAL DATA:  Right renal mass diagnosed 2014. Followup lung opacity. EXAM: CT CHEST WITH CONTRAST TECHNIQUE: Multidetector CT  imaging of the chest was performed during intravenous contrast administration. CONTRAST:  44m ISOVUE-300 IOPAMIDOL (ISOVUE-300) INJECTION 61% COMPARISON:  CT abdomen 07/27/2015 FINDINGS: Cardiovascular: Heart is normal in size. There is calcified plaque over the left anterior descending and right coronary arteries. Calcified plaque over the thoracic aorta. Mild aneurysmal dilatation of the ascending thoracic aorta measuring 4 cm in AP diameter. Mediastinum/Nodes: There are calcified mediastinal and right hilar lymph nodes. Mild wall thickening of the distal esophagus just above the gastroesophageal junction likely related  to reflux. Lungs/Pleura: Lungs are adequately inflated and demonstrate a region of nodularity and pleural-based consolidation over the posterior medial right lower lobe with mild associated bronchiectatic change. This is slightly larger measuring 1.7 x 5.2 cm (previously 1.1 x 4.4 cm). Triangular 4 mm nodular density along the minor fissure likely fissural lymph node. Left lung is clear. Mild biapical pleural thickening. Upper Abdomen: Previous cholecystectomy. Multiple calcified splenic granulomas. Kidneys unchanged with mild dilatation of the right intrarenal collecting system. Several small right renal cortical hypodensities unchanged and likely cysts. 9 cm left renal cyst unchanged. Several other smaller left renal cysts unchanged. Previous cryoablation site over the lower pole right kidney unchanged. 3.6 cm infrarenal abdominal aortic aneurysm unchanged. Mild calcified plaque over the abdominal aorta. Musculoskeletal: Moderate degenerative change of the spine. IMPRESSION: Pleural based focal opacity over the posterior medial right lower lobe with interval increase in size measuring 1.7 x 5.2 cm (previously 1.1 x 4.4 cm). Mild associated bronchiectatic change and subtle adjacent nodularity unchanged. Findings most typical of a post infectious/inflammatory process, however neoplastic process is  not excluded. Consider PET-CT for further evaluation. 4 cm ascending thoracic aortic aneurysm. Recommend semi-annual imaging followup by CTA or MRA and referral to cardiothoracic surgery if not already obtained. This recommendation follows 2010 ACCF/AHA/AATS/ACR/ASA/SCA/SCAI/SIR/STS/SVM Guidelines for the Diagnosis and Management of Patients With Thoracic Aortic Disease. Circulation. 2010; 121: e266-e36. Aortic atherosclerosis.  Atherosclerotic coronary artery disease. Bilateral renal cysts unchanged. Cryoablation site over the upper pole right kidney unchanged. 3.6 cm infrarenal abdominal aortic aneurysm unchanged. Mild wall thickening of the distal esophagus likely due to reflux. Electronically Signed   By: Marin Olp M.D.   On: 11/09/2015 09:59   Labs:  CBC: No results for input(s): WBC, HGB, HCT, PLT in the last 8760 hours.  COAGS: No results for input(s): INR, APTT in the last 8760 hours.  BMP:  Recent Labs  06/16/15 0850 11/03/15 1633  NA 144  --   K 3.9  --   CL 102  --   CO2 33*  --   GLUCOSE 101*  --   BUN 21 23  CALCIUM 9.3  --   CREATININE 1.51* 1.63*  GFRNONAA  --  38*  GFRAA  --  44*    LIVER FUNCTION TESTS:  Recent Labs  06/16/15 0850  BILITOT 1.0  AST 17  ALT 11  ALKPHOS 53  PROT 7.4  ALBUMIN 4.8    Assessment and Plan:  I met with Mr. Bart and his wife. We reviewed the CT of the chest performed earlier today. This demonstrates a gradually enlarging area of subpleural parenchymal abnormality in the posterior right lower lobe associated with some bronchiectasis and bronchial thickening. This has an appearance most likely of an inflammatory process. This would be an unusual appearance for pulmonary malignancy.   Mr. Steinert has seen Dr. Lenna Gilford in the past. I have recommended that he return to consult with Dr. Lenna Gilford or one of his partners regarding Pulmonology recommendation. The renal ablation site does not need to be checked until next  April.   Electronically SignedAletta Edouard T 11/09/2015, 1:47 PM     I spent a total of15 Minutes in face to face in clinical consultation, greater than 50% of which was counseling/coordinating care for an enlarging abnormality at the right lung base.

## 2015-11-28 ENCOUNTER — Encounter: Payer: Self-pay | Admitting: Cardiology

## 2015-11-28 ENCOUNTER — Ambulatory Visit (INDEPENDENT_AMBULATORY_CARE_PROVIDER_SITE_OTHER): Payer: Commercial Managed Care - HMO | Admitting: Cardiology

## 2015-11-28 VITALS — BP 148/90 | HR 146 | Ht 76.0 in | Wt 180.4 lb

## 2015-11-28 DIAGNOSIS — I480 Paroxysmal atrial fibrillation: Secondary | ICD-10-CM

## 2015-11-28 DIAGNOSIS — I10 Essential (primary) hypertension: Secondary | ICD-10-CM

## 2015-11-28 LAB — CBC
HCT: 46.1 % (ref 38.5–50.0)
HEMOGLOBIN: 15.2 g/dL (ref 13.2–17.1)
MCH: 30.2 pg (ref 27.0–33.0)
MCHC: 33 g/dL (ref 32.0–36.0)
MCV: 91.7 fL (ref 80.0–100.0)
MPV: 9.6 fL (ref 7.5–12.5)
PLATELETS: 172 10*3/uL (ref 140–400)
RBC: 5.03 MIL/uL (ref 4.20–5.80)
RDW: 14.6 % (ref 11.0–15.0)
WBC: 6 10*3/uL (ref 3.8–10.8)

## 2015-11-28 LAB — BASIC METABOLIC PANEL
BUN: 22 mg/dL (ref 7–25)
CALCIUM: 8.7 mg/dL (ref 8.6–10.3)
CO2: 29 mmol/L (ref 20–31)
CREATININE: 1.49 mg/dL — AB (ref 0.70–1.11)
Chloride: 98 mmol/L (ref 98–110)
Glucose, Bld: 80 mg/dL (ref 65–99)
Potassium: 4 mmol/L (ref 3.5–5.3)
SODIUM: 138 mmol/L (ref 135–146)

## 2015-11-28 NOTE — Patient Instructions (Signed)
Medication Instructions:  Your physician recommends that you continue on your current medications as directed. Please refer to the Current Medication list given to you today.  Labwork: TODAY: CBC AND BMET  Testing/Procedures: NONE ORDERED  Follow-Up: Your physician wants you to follow-up in: [place]. You will receive a reminder letter in the mail two months in advance. If you don't receive a letter, please call our office to schedule the follow-up appointment.  Any Other Special Instructions Will Be Listed Below (If Applicable).     If you need a refill on your cardiac medications before your next appointment, please call your pharmacy.

## 2015-11-28 NOTE — Progress Notes (Signed)
Cardiology Office Note   Date:  11/28/2015   ID:  William [name], DOB [birth year], MRN [id number]  PCP:  Sheral Flow, NP  Cardiologist:   Minus Breeding, MD   Chief Complaint  Patient presents with  . Atrial Fibrillation      History of Present Illness: William [name] is a 82 y.o. male who presents for evaluation of atrial fibrillation.  He has had paroxysmal atrial fibrillation. He's had a TIA as well.  I saw him last year.  Since I last saw him he has done well.  The patient denies any new symptoms such as chest discomfort, neck or arm discomfort. There has been no new shortness of breath, PND or orthopnea. There have been no reported palpitations, presyncope or syncope.  He is doing some boxing to help with his Parkinsons.  They do note that with this activity at times his heart rate goes up but it comes back down quickly.    Past Medical History:  Diagnosis Date  . A-fib ([place])   . Arthritis    "right knee" (08/09/2014)  . Basal cell carcinoma   . Benign prostatic hypertrophy with urinary obstruction   . Complication of anesthesia    "difficulty intubation, had to use fiberoptic 2006"  . Difficult intubation   . DJD (degenerative joint disease)   . Gout   . Hx of colonic polyps   . Hyperlipidemia   . Hypertension    sees [name]  . Hypothyroidism   . Kidney tumor    "tumor on cyst on kidney"   . 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 Mini stroke ([place])   . Parkinson disease ([place])   . Pneumonia    hx of in 1952  . Primary skin squamous cell carcinoma   . Subdural hematoma ([place]) 2006  . TIA (transient ischemic attack)    pt does not recall this hx "but may have had one today" (08/09/2014)    Past Surgical History:  Procedure Laterality Date  . APPENDECTOMY    . BACK SURGERY    . CATARACT EXTRACTION W/ INTRAOCULAR LENS  IMPLANT, BILATERAL Bilateral ~ 1998  . CHOLECYSTECTOMY    . CRYOABLATION  07-10-12   "tumor  on cyst on my kidney; had an ablation"  . EYE SURGERY    . INGUINAL HERNIA REPAIR Right   . KIDNEY SURGERY    . KNEE ARTHROSCOPY Right   . LUMBAR LAMINECTOMY/DECOMPRESSION MICRODISCECTOMY  05/14/2012   Procedure: LUMBAR LAMINECTOMY/DECOMPRESSION MICRODISCECTOMY 1 LEVEL;  Surgeon: [name], MD;  Location: [facility];  Service: Neurosurgery;  Laterality: Left;  Left Lumbar four-five Laminectomy/Diskectomy/Far lateral diskectomy  . SKIN CANCER EXCISION  "several"   "back of my neck; right eye; clavicle; nose"  . SUBDURAL HEMATOMA EVACUATION VIA CRANIOTOMY  2006  . THYROIDECTOMY     medullary carcinoma  . TONSILLECTOMY       Current Outpatient Prescriptions  Medication Sig Dispense Refill  . apixaban (ELIQUIS) 2.5 MG TABS tablet Take 1 tablet (2.5 mg total) by mouth 2 (two) times daily. 180 tablet 3  . carbidopa-levodopa (SINEMET IR) 25-100 MG tablet Take 1 tablet by mouth 4 (four) times daily. At 8, 12, 5 PM, and 9 or 10 PM 360 tablet 3  . hydrALAZINE (APRESOLINE) 25 MG tablet Take 1 tablet (25 mg total) by mouth 3 (three) times daily. 90 tablet 3  . levothyroxine (SYNTHROID, LEVOTHROID) 100 MCG tablet TAKE  1 TABLET EVERY DAY BEFORE BREAKFAST 90 tablet 0  . losartan-hydrochlorothiazide (HYZAAR) 100-12.5 MG tablet Take 1 tablet by mouth daily. 90 tablet 3  . Multiple Vitamin (MULTIVITAMIN WITH MINERALS) TABS Take 1 tablet by mouth daily. Reported on 08/01/2015    . Probiotic Product (ALIGN) 4 MG CAPS Take 1 capsule by mouth daily.    . rasagiline (AZILECT) 1 MG TABS tablet Take 1 tablet (1 mg total) by mouth daily. 90 tablet 3  . simvastatin (ZOCOR) 20 MG tablet Take 1 tablet (20 mg total) by mouth at bedtime. 90 tablet 3   No current facility-administered medications for this visit.     Allergies:   Review of patient's allergies indicates no known allergies.    ROS:  Please see the history of present illness.   Otherwise, review of systems are positive for none.   All other  systems are reviewed and negative.    PHYSICAL EXAM: VS:  BP (!) 148/90   Pulse (!) 146   Ht 6\' 4"  (1.93 m)   Wt 180 lb 6.4 oz (81.8 kg)   BMI 21.96 kg/m  , BMI Body mass index is 21.96 kg/m. GENERAL:  Well appearing NECK:  No jugular venous distention, waveform within normal limits, carotid upstroke brisk and symmetric, no bruits, no thyromegaly LYMPHATICS:  No cervical, inguinal adenopathy LUNGS:  Clear to auscultation bilaterally CHEST:  Unremarkable HEART:  PMI not displaced or sustained,S1 and S2 within normal limits, no S3, no S4, no clicks, no rubs, no murmurs ABD:  Flat, positive bowel sounds normal in frequency in pitch, no bruits, no rebound, no guarding, no midline pulsatile mass, no hepatomegaly, no splenomegaly EXT:  2 plus pulses throughout, no edema, no cyanosis no clubbing NEURO:  Motor grossly intact throughout, resting tremor  EKG: Sinus rhythm with baseline artifact, right bundle branch block, no acute ST-T wave changes. 11/28/2015  Recent Labs: 06/16/2015: ALT 11; Potassium 3.9; Sodium 144 11/03/2015: BUN 23; Creat 1.63    Lipid Panel    Component Value Date/Time   CHOL 160 06/16/2015 0850   TRIG 124.0 06/16/2015 0850   TRIG 101 03/25/2006 0935   HDL 54.30 06/16/2015 0850   CHOLHDL 3 06/16/2015 0850   VLDL 24.8 06/16/2015 0850   LDLCALC 81 06/16/2015 0850      Wt Readings from Last 3 Encounters:  11/28/15 180 lb 6.4 oz (81.8 kg)  11/09/15 180 lb (81.6 kg)  10/26/15 178 lb (80.7 kg)      Other studies Reviewed: Additional studies/ records that were reviewed today include:    Review of the above records demonstrates:     ASSESSMENT AND PLAN:  ATRIAL FIB:  The patient has a CHA2DS2 - VASc score of 5 with a risk of stroke of 6.7%. At this point he will continue with the meds as listed. No change in therapy is indicated.  At the last visit I did stop the ASA after a long discussion with the patient and his wife.   This year no change in therapy is  indicated.  I will be checking a CBC and BMET today.   THORACIC AORTIC ENLARGEMENT:  I did review a CT he had done recently to follow-up nodule. He has a 4 cm ascending thoracic aorta with in follow this with repeat imaging in the future.  HTN:   His blood pressure is elevated but this is unusual.  No change in therapy is indicated.    Current medicines are reviewed at length with the  patient today.  The patient does not have concerns regarding medicines.  The following changes have been made:  None  Labs/ tests ordered today include: None   Orders Placed This Encounter  Procedures  . CBC  . Basic Metabolic Panel (BMET)  . EKG 12-Lead     Disposition:   FU with me in 12 months.    Signed, Minus Breeding, MD  11/28/2015 5:22 PM    [facility]

## 2015-11-29 ENCOUNTER — Other Ambulatory Visit (INDEPENDENT_AMBULATORY_CARE_PROVIDER_SITE_OTHER): Payer: Commercial Managed Care - HMO

## 2015-11-29 ENCOUNTER — Encounter: Payer: Self-pay | Admitting: Pulmonary Disease

## 2015-11-29 ENCOUNTER — Ambulatory Visit (INDEPENDENT_AMBULATORY_CARE_PROVIDER_SITE_OTHER): Payer: Commercial Managed Care - HMO | Admitting: Pulmonary Disease

## 2015-11-29 VITALS — BP 114/66 | HR 55 | Temp 97.5°F | Ht 76.0 in | Wt 179.2 lb

## 2015-11-29 DIAGNOSIS — I7091 Generalized atherosclerosis: Secondary | ICD-10-CM | POA: Diagnosis not present

## 2015-11-29 DIAGNOSIS — D3001 Benign neoplasm of right kidney: Secondary | ICD-10-CM

## 2015-11-29 DIAGNOSIS — G20 Parkinson's disease: Secondary | ICD-10-CM

## 2015-11-29 DIAGNOSIS — R9389 Abnormal findings on diagnostic imaging of other specified body structures: Secondary | ICD-10-CM

## 2015-11-29 DIAGNOSIS — Z7901 Long term (current) use of anticoagulants: Secondary | ICD-10-CM

## 2015-11-29 DIAGNOSIS — I480 Paroxysmal atrial fibrillation: Secondary | ICD-10-CM | POA: Diagnosis not present

## 2015-11-29 DIAGNOSIS — M150 Primary generalized (osteo)arthritis: Secondary | ICD-10-CM

## 2015-11-29 DIAGNOSIS — N182 Chronic kidney disease, stage 2 (mild): Secondary | ICD-10-CM

## 2015-11-29 DIAGNOSIS — I10 Essential (primary) hypertension: Secondary | ICD-10-CM

## 2015-11-29 DIAGNOSIS — R938 Abnormal findings on diagnostic imaging of other specified body structures: Secondary | ICD-10-CM | POA: Diagnosis not present

## 2015-11-29 DIAGNOSIS — M159 Polyosteoarthritis, unspecified: Secondary | ICD-10-CM

## 2015-11-29 DIAGNOSIS — G458 Other transient cerebral ischemic attacks and related syndromes: Secondary | ICD-10-CM

## 2015-11-29 LAB — SEDIMENTATION RATE: SED RATE: 10 mm/h (ref 0–20)

## 2015-11-29 NOTE — Patient Instructions (Signed)
William [name]-- it was great seeing you again...  We reviewed your previous scans from [facility] and the abnormality at the right lung base...  We will proceed w/ further evaluation checking a PET scan...    We will contact you w/ the results when available...   Keep up the great job w/ your boxing exercise for the [name]...    If you are interested-- [facility] on public radio had a program last week about exercise & Parkinson's    You can find them on the Internet & look up program #[id number] (I believe that's the one)...  Call for any questions.Marland KitchenMarland Kitchen

## 2015-11-30 ENCOUNTER — Encounter: Payer: Self-pay | Admitting: Pulmonary Disease

## 2015-11-30 NOTE — Progress Notes (Addendum)
Subjective:    Patient ID: William [name], male    DOB: 1928-10-21, 82 y.o.   MRN: [id number]  HPI 82 y/o WM here for a follow up visit... he has multiple medical problems as noted below... followed for HBP, Carotid vasc dis w/ hx of TIA & prev subdural hematoma, Chol, & prev surg for medullary thyroid carcinoma... his wife passed away from metastatic breast cancer 11/09... 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  LABS 1/14 preop in EPIC> Chems- ok & Cr=1.3-1.5;  CBC- ok  LABS 8/14:  FLP- at goals on Simva20;  Chems- ok x Cr=1.5;  CBC- wnl;  TSH=0.54 & FreeT4=1.28 (0.6-1.60)...  CT Abd showed further contraction of the cryoablation zone within the lower part of the right kidney, no enhancement, bilat renal cysts, chr right UPJ stenosis; Incidentally noted [place], mult splenic granuloma, diffuse Ao atherosclerosis & dilatation of infrarenal Ao to 3.3cm, DJD in lumbar spine...   LABS 9/15:  FLP- at goals on Simva20;  Chems- ok x Cr=1.4;  CBC- wnl;  TSH=0.54;  FreeT3 & FreeT4- wnl...  ~  April 26, 2014:  24mo ROV & add-on appt requested for HBP> he has been well controlled on Cardura8- 1/2 daily and Hyzaar100-12.5; over the last week his home BP checks were in the 200/100 range and [name] notes that "he feels it"; pt states that his head feels heavy- denies CP, palpit, SOB, edema; I note that wt is up 7# to 186# today, he denies eating salt; BP here today= 132/84 & they didn't bring his cuff; we decided to check CXR (norm heart size, NAD) and BMet (ok x HCO3=36, Cr=1.4); we decided to incr  Cardura to 8mg /d and f/u 90mo; get on diet & gety wt back down; he will call in the interim for concerns...    We reviewed prob list, meds, xrays and labs> see below for updates >>   CXR 1/16 showed norm heart size, clear hyperinflated lungs, stable mediastinal calcif, NAD...  LABS 1/16:  Chems- ok x HCO3=36 Cr=1.4.Marland KitchenMarland Kitchen PLAN>>  We decided to incr his Cardura to 8mg /d; he will continue the Hyzaar & monitor BP at home, ROV 75mo, call for questions...   ~  July 22, 2014:  2mo ROV & post hospital check> William [name] was Adm 4/3 - 07/18/14 by [facility] w/ AFib & RVR, after waking up weak, felt "funny", BP was low; In ER found AFib/ rvr, no CP, & he converted to NSR w/ Cardizem drip; CHA2DS2-VASc score= 5 for age>75, hx of TIA, male, HBP & started on Xarelto15/d (due to hx subdural in past)... Note: mult other changes to is meds during this brief hosp> he goes to the [facility] for his med refills...    HBP> on Losar25 & Cardizem60Bid; BP=120/70 today & denies CP, palpit, SOB, edema... Continue these new meds...    PAF> adm 4/16 w/ PAF &rvr; converted spont to NSR & meds adjusted, disch on Xarelto15/d...    ASPVD> off ASA 81 now since Xarelto started; denies cerebral ischemic symptoms etc; IR found ~  4cm AAA on f/u CTAbd 4/16 & he has appt w/ DrBrabham...Marland KitchenMarland KitchenMarland Kitchen    Chol> on Lip20 now, Fish Oil & diet rx; FLP 9/15 (on Simva20) shows TChol 131, TG 72, HDL 50, LDL 67    Hx medullary thyroid cancer> on Synthroid100 now; followed by [facility] for CCS; TFTs were wnl on Levothy150 w/ TSH 9/15 = 0.54 & FreeT3=3.0 (2.3-4.2) & FreeT4= 1.31 (0.6-1.60); dose decr to 164mcg/d by Cards due to PAF 4/16.    GI- Divertics, HxPolyps> on Align; last colon 2007 by DrSamLeB showed divertics, hems, no polyps & f/u suggested 77yrs but now >82y/o...    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    DJD, LBP> off Vicodin, on Tylenol; he developed LBP after lifting a mattress 5/13; eval by DrBrooks & DrHirsh w/ MRI reported to show HNP at L4-5 (w/ myelopathy & radiculopathy); DrHirsh did LumbarLam 1/14 w/ decompression L4 & L5 w/ diskectomy=> improved...    Neuro- TIA, Hx subdural hemtoma, Tremor/Parkinson's> now followed by DrAthar for Neuro on Sinemet25-100Qid & Azilect1mg /d- seen 04/2014 & he reports stable...    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  CXR 4/16 showed norm heart size, clear lungs, mult calcif mediastinal LNs (old gran dis), NAD...   2DEcho 4/16 showed mild LVH, norm LVF=60-65% & no regional wall motion abn, Gr1DD, valves OK, LA=106mm, mod RV dil...   LABS 4/16:  Reviewed- Cr=1.3-1.6, sl elev troponins, TSH=0.532...  IMP/ PLAN>>  Brief episode of PAF, converted spont to NSR, started on Xarelto15 for elev risk (but has hx SDH as well) and mult other meds adjusted as noted;  He is feeling better overall & awaiting f/u appt w/ Cards- DrHochrein;  He had f/u [facility]Abd by [facility] & his cryoablation zone lower pole right kidney is further sl improved but there was a 4cm AAA & he has appt w/ DrBrabham to follow this... He will need f/u FLP, BMet, & TSH on return...  ~  Aug 23, 2014:  3mo ROV & add-on for HBP & to review recent events> William [name] was [facility] 4/26 - 08/10/14 by [facility] w/ TIA after presenting w/ transient aphasia & confusion; he was already on Xarelto15 (due to PAF w/ CHADS-VASc=5 but prev hx subdural hematoma); CT Brain w/o acute changes (mild atrophy & sm vessel dis), subseq MRI/MRA was also neg w/o infarct or large vessel occlusion or stenosis (prev left craniotomy, right P2/3 mid grade stenosis); seen by Neurology &  they rec adding ASA81/d;  He was also bradycardic & his Cardizem was stopped & ultimately Cozaar dose increased...  He had Cards f/u DrHochrein 4/29 & returned to the ER 5/5 w/ elevBP and ?slurring (vs his regular parkinson's voice)=>  His Xarelto & ASA were continued and Cozaar changed to Hyzaar100-12.5.Marland KitchenMarland Kitchen    HBP> on Hyzaar100-12.5 & off Cardizem; BP=180/100 today & later down to 148/80; denies CP, palpit, SOB, edema; BP at home >200 & we decided to add HYDRALAZINE 25Tid.    PAF> adm 4/16 w/ PAF &rvr; converted spont to NSR & meds adjusted, disch on Xarelto15/d, holding NSR.Marland KitchenMarland Kitchen  ASPVD> back on ASA 81 & Xarelto since his TIA 4/16; denies further cerebral ischemic symptoms etc; IR found ~4cm AAA on f/u CTAbd 4/16 & he has appt w/ DrBrabham......    Neuro- TIA, Hx subdural hemtoma, Tremor/Parkinson's> now followed by DrAthar for Neuro on Sinemet25-100Qid but they stopped Azilect1mg /d since [place] read that it can cause elevBP...    Hx medullary thyroid cancer> on Synthroid100 now; followed by [facility] for CCS; TFTs were wnl on Levothy150 w/ TSH 9/15 = 0.54 & FreeT3=3.0 (2.3-4.2) & FreeT4= 1.31 (0.6-1.60); dose decr to 156mcg/d by Cards due to PAF-4/16 We reviewed prob list, meds, xrays and labs> see below for updates >>   LABS 5/16:  TSH=2.94 PLAN>>  William [name] will continue the Hyzaar100-12.5 & add Hydralazine25tid while watching his BP;  They stopped his Azilect since [place] read that prolonged use can cause incrBP- he is asked to check w/ Neuro;  ROV 73mo to f/u BP on meds... TSH is wnl on Synthroid100- continue same.  ~  November 22, 2014:  52mo ROV & follow up of his BP- last OV we added Hydralazine25Tid to his regimen of Hyzaar100-12.5; tolerated well & BP is improved at 142/76, recheck 130/70-  w/o CP, palpit, dizzy, edema=> William [name] has esablished Primary Care closer to home [facility]) at [facility] office w/ Eliezer Lofts...    ~  November 30, 2015:  1 year ROV & pulmonary follow up visit requested by  DrYamagata>  William [name] has been followed regularly by [facility] due to his right renal oncocytoma treated w/ cryoablation [id number];  Serial CT scans of the abd have shown post ablation changes w/ contraction of the cryoablation zone in lower pole of right kidney, but a small area of nodular scarring was noted in the medial RLL gutter as far back as 2014-15 (other incidental findings on the CT Abd included s/pGB & mild CBD dilatation, duodenal divertic, mult calcif splenic granulomas, atherosclerotic change & mild aneursymal ectasia of the infrarenal AA~3.3cm, bilat renal cysts, and multilevel DDD in spine);  CT Abd 07/27/15 showed enlargement of this nodular scarring in the medical right base and a dedicated CT Chest done 11/09/15 showed norm heart size, calcif plaque in the Ao & coronaries (asc Ao ~4cm), calcif mediastinal & right hilar nodes, area of nodularity & pleural based consolidation over the post-medial RLL w/ assoc bronchiectatic changes (sl larger that 07/2015); Radiology favors a post infectious/ inflammatory process but neoplasm can't be excluded...     William [name] has a remote smoking hx- smoked up to 1ppd spanning his 54s into his 42s for a 10 pack-yr hx but he quit 50+ yrs ago;  He denies exposure to asbestos or silica dusts;  From the symptomatic standpoint he denies cough, sputum, hemoptysis, SOB/change in DOE, CP, etc;  He has been doing boxing therapy for his Parkinson's dis & improved...  EXAM shows Afeb, VSS, O2sat=98% on RA;  HEENT- neg, mallampati2;  Chest- clear w/o w/r/r;  Heart- RR gr1/6SEM w/o r/g;  Abd- soft, nontender, neg;  Ext- neg w/o c/c/e;  Neuro- parkinson's, no focal neuro deficits...   LABS8/2017>  Chems- wnl x Cr=1.49;  CBC- wnl w/ Hg=15.2;  Sed=10...  PET-CT scan> ordered and pending... IMP/PLAN>>  William [name] is 82 y/o w/ mult co-morbidities (Parkinsons, ASHD, PAF on Xarelto, ASPVD w/ prev TIA, hx medullary thyroid ca);  These would surely compromise any aggressive work-up/ treatment  for this peripheral lung abnormality in a relatively asymptomatic individual;  In any event he would like to pursue  further evaluation- Labs are unrevealing & PET scan is the next step=> pending...   ADDENDUM>>  PET done 12/06/15>  Low level hypermetabolism corresponding to the subpleural RLL opac assoc w/ bronchiectasis & fluid filled bronchi- strongly favoring infectious/ inflamm etiology including chr aspiration (neoplasm felt to be considerably less likely)... Other incidental findings on prior CT scans.  ADDENDUM>>  CT Abd 08/08/16>>  IMPRESSION: 1. Stable exam. No changes in the ablation scar suggests recurrent disease. No metastatic disease in the abdomen. 2. Several tiny hypervascular foci in the liver parenchyma, stable and most consistent with benign process such as flash filling cavernoma or vascular malformation. Attention on follow-up suggested. 3. No change right hydronephrosis with imaging features consistent with UPJ obstruction. 4. Stable bilateral renal cysts. 5. Infrarenal abdominal aortic aneurysm. Recommend followup by ultrasound in 2 years. This recommendation follows ACR consensus guidelines: White Paper of the ACR Incidental Findings Committee II on Vascular Findings. J Am Coll Radiol 2013; 10:789-794. 6. Stable intra and extrahepatic biliary duct dilatation. 7. Airspace disease posterior right lower lobe seen on previous exams has almost completely resolved. There is 1 residual area that has a 14 mm somewhat nodular component and attention to this area on follow-up imaging is recommended.           Problem List:  HYPERTENSION (ICD-401.9) - controlled on HYZAAR 100/25 daily, & CARDURA 8mg - taking 1/2 daily... ~  2/13:  BP= 122/60 & he denies CP, palpit, SOB, edema, etc... ~  8/13:  BP= 140/80 & he remains largely asymptomatic... ~  CXR 1/14 showed normal heart size, ectatic & calcif Ao, low lying diaph, clear lungs, NAD... ~  EKG 1/14 showed NSR, rate61,  Poor R prog V1-3,  NSSTTWA... ~  2/14: on Hyzaar100-12.5 & Cardura8; BP=138/80 today & denies CP, palpit, SOB, edema. ~  3/15: on Hyzaar100-12.5 & Cardura8-1/2; BP=120/58 today & he remains asymptomatic... ~  9/15: on Hyzaar100-12.5 & Cardura8-1/2; BP=110/70 today & denies CP, palpit, SOB, edema... ~  1/16: he presented w/ BP up to 200/100 at home; it was 132/84 here & we decided to incr Cardura to 8mg /d, recheck 53mo... ~  CXR 1/16 showed norm heart size, clear hyperinflated lungs, stable mediastinal calcif, NAD ~  4/16:  He was hosp this month w/ PAF> now on [place]; BP=120/70 today & denies CP, palpit, SOB, edema; subseq Cardizem stopped due to bradycardia & Losar incr to 50mg /d; then this was changed to Hyzaar100-12.5 ~  5/16:  BP still elev at home & office on Hyzaar100-12.5 now; BP= 180/100 & we decided to add HYDRALAZINE 25Tid...   PAF >> he was adm 4/3-07/2014 w/ PAF & rvr, converted w/ Cardizem drip & meds adjusted- disch on Xarelto15 & Cardizem60Bid, holding NSR & doing satis...  ATHEROSCLEROTIC VASCULAR DISEASE (ICD-440.9) - on ASA 81mg /d;  Prev Plavix was discontinued after his subdural hematoma in 2006. ~  Eval 2003 by [facility] for TIA showed cerebrovasc dis w/ R>L PCA stenoses... ~  4/15: f/u CT Abd (f/u cryoablation of renal oncocytoma) also showed 3.3cm AAA being followed... ~  4/16: f/u CT Abd by IR, DrYamagata showed further contraction of the cryoablation defect w/ other changes persisting & AAA now 3.9cm in size=> he's been referred to DrBrabham to establish care...  HYPERLIPIDEMIA (ICD-272.4) - on SIMVASTATIN 40mg -taking 1/2 + FISH OIL 1000mg /d> doing well. ~  FLP 12/07 on Zocor showed TChol 137, HDL 47, LDL 70 ~  FLP 7/09 off med showed TChol 196, TG 95, HDL 49, LDL  128... rec> restart Zocor 40mg . ~  FLP 7/10 showed TChol 135, TG 47, HDL 53, LDL 73... He decr dose to 1/2 tab on his own... ~  [place] 8/11 showed TChol 138, TG 73, HDL 49, LDL 75 ~  FLP 8/12 showed TChol 125, TG 69, HDL  56, LDL 56 ~  FLP 2/13 showed TChol 133, TG 69, HDL 61, LDL 59 ~  FLP 8/13 showed TChol 137, TG 73, HDL 53, LDL 70 ~  FLP 8/14 on Simva20 showed  TChol 128, TG 74, HDL 48, LDL 66 ~  FLP 9/15 on Simva20 showed TChol 131, TG 72, HDL 50, LDL 67  ~  4/16:  Now on Atorva20...  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 ~  labs 7/09 showed TSH= 3.47... on SYNTHROID 263mcg/d ~  labs 7/10 showed TSH= 2.95 ~  labs 8/11 showed TSH= 1.20 ~  Labs 8/12 showed TSH= 0.23 & pt rec to decr the Synthroid200 to only 1/2 on MWF.Marland Kitchen. ~  Labs 2/13 showed TSH= 2.29 on Synthroid200- taking 1x4d=TThSS and 1/2 x3d=MWF.Marland Kitchen. ~  Labs 8/13 showed TSH= 0.69... On same dose. ~  Labs 8/14 on Synthroid200-taking 1 alt w/ 1/2 qod showed TSH=0.54, Free T4=1.28 ~  3/15: we discussed change in Synthroid to 150mcg/d... ~  Labs 9/15 on Synthroid150 showed TSH = 0.54 & FreeT3=3.0 (2.3-4.2) & FreeT4= 1.31 (0.6-1.60) ~  4/16:  He is now on Synthroid 116mcg/d since hosp for PAF 4/16... TSH on Levothy100= 2.94, continue same.  COLONIC POLYPS (ICD-211.3) - last colonoscopy 1/07 by DrSam showed divertics & hems only... f/u planned 22yrs.  BENIGN PROSTATIC HYPERTROPHY, WITH OBSTRUCTION (ICD-600.01) - he sees DrDavis for Urology once a year... he has a brother who had prostate cancer...  ~  PSA followed by [id number] yrearly... 7/10 pt reports PSA>5 (was 4.17 here) & Rx w/ Cipro... ~  he had f/u Urology eval DrGrapey 5/11- pt indicates that his PSA was OK... 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 ~  2/14:  followed by DrGrapey & DrYamagata re-imaged the lesion via CT 1/14 w/ growth evident & he has rec Cryoablation=> pending due to need for LLam first... ~  3/14:  he underwent cryoablation of right renal oncocytic tumor by DrYamagata (pt reports painful procedure) ~  5/14:  f/u visit w/ IR & CT Abd=> 2 left renal cysts & lower pole right renal cyst, cryoablation zone in inferior pole right kidney measures 4x3cm; atherosclerotic changes in Ao, mult splenic granulomata, prom CBD... ~  9/14: f/u visit w/ IR, DrYamagata> s/p percut cryoablation of right renal oncocytic tumor 3/14; doing well, CTshowed expected post ablation changes, no resid or recurrent tumor suspected...  ~  1/15: f/u visit  w/ DrGrapey> doing well from the renal tumor standpoint, BPH, LTOS, ED, etc... ~  4/14: he had f/u DrYamagata> doing well, asymptomatic- BUN=17, Cr=1.21, f/u CT showed further retraction of right sided cryoablation defect, no abnormal enhancement, bilat renal cysts... ~  4/15: he had yearly f/u w/ DrYamagata> CT Abd showed further contraction of the cryoablation zone in the lower pole of the right kidney, no recurrent tumor, stable bilat renal cysts, chr right UPJ stenosis, 3.3cm AAA noted... ~  4/16: he had yearly f/u by IR> CT Abd showed post ablation changes w/ contraction of the cryoablation zone in lower pole of right kidney & no enhancement, mild infrarenal AAA ~4cm & he has appt w/ DrBrabham soon                TRANSIENT ISCHEMIC ATTACK (ICD-435.9) - hx of recurrent right brain TIA's in 2003... initially on ASA + Plavix and the Plavix was stopped after his subdural in 2006... ~  [facility] 4/16 by [facility] w/ TIA>  presented w/ aphasia & confusion=> rapidly cleared; Neuro rec adding back ASA81 to his [place]...  Hx of SUBDURAL HEMATOMA (ICD-432.1) - hosp 7/06 w/ left subdural hematoma req craniotomy by DrHirsh for evacuation... no known trauma- he was on ASA/ Plavix and developed a headache... he also take DILANTIN 100mg  Tid now per [facility] (for a partial seizure characterized by aphasia)... ~  7/10: pt tells me he has decreased his Dilantin to Bid and will f/u w/ DrLove. ~  8/11:  we don't have any recent notes from [facility], but pt is off the Dilantin rx.  TREMOR (ICD-781.0) - he notes mild tremor right hand & arm> eval from Neurology- Gean Quint- indicated prob early [name] and after a period of observation they started Mirapex (stopped due to hypotension), changed to [facility] 2/12> dosed per DrLove. ~  8/11: pt tells me that he has started accupuncture treatments in [place]==> no benefit. ~  2/12:  pt indicates that DrLove stopped the Mirapex in favor of SINEMET 25/100- taking 1/2 Tid. ~  8/12:  Pt tells me he has seen DrScott, Neurology at [facility] (?referred by [facility], ?second opinion), we don't have records, pt reports no change in meds (he considered entering a drug study). ~  He continues to f/u w/ DrLove every 4-44months... ~  8/13:  He indicates that Drlove recently increased the SINEMET 25/100 to 1 tab Tid... ~  8/14:  He had Neuro f/u DrAthar> note reviewed & pt stable on Sinemet Tid + Azilect 1mg /d (added 5/14 by Neuro)...  DEGENERATIVE JOINT DISEASE (ICD-715.90) - he notes some knee discomfort & he takes Aleve & HYDROCODONE as needed... LBP >> injured back lifting mattress 5/13; eval by DrBrooks & DrHirsh w/ abn MRI (done by Ortho at their office) & disc herniation L4-5 by report; Pred w/o help, ESI helped some, using pain Rx & holding off on surg per DrHirsh... ~  1/14: on Vicodin prn; eval by DrBrooks & DrHirsh w/ MRI reported to show HNP at L4-5 (w/ myelopathy & radiculopathy); DrHirsh did  LumbarLam 1/14 w/ decompression L4 & L5 w/ discectomy=> improved...  Hx of CARCINOMA, SKIN, SQUAMOUS CELL (ICD-173.9) - s/p removal of skin cancer from his right forearm 11/06 by DrLupton... ~  9/15: he reports another skin cancer removed...  Health Maintenance:  he had PNEUMOVAX vaccine in 1998 & 1/11... gets yearly Flu vaccine every fall... TDAP given 8/11...   Past Surgical History:  Procedure Laterality Date  . APPENDECTOMY    .  BACK SURGERY    . CATARACT EXTRACTION W/ INTRAOCULAR LENS  IMPLANT, BILATERAL Bilateral ~ 1998  . CHOLECYSTECTOMY    . CRYOABLATION  07-10-12   "tumor on cyst on my kidney; had an ablation"  . EYE SURGERY    . INGUINAL HERNIA REPAIR Right   . KIDNEY SURGERY    . KNEE ARTHROSCOPY Right   . LUMBAR LAMINECTOMY/DECOMPRESSION MICRODISCECTOMY  05/14/2012   Procedure: LUMBAR LAMINECTOMY/DECOMPRESSION MICRODISCECTOMY 1 LEVEL;  Surgeon: [name], MD;  Location: [place] [facility];  Service: Neurosurgery;  Laterality: Left;  Left Lumbar four-five Laminectomy/Diskectomy/Far lateral diskectomy  . SKIN CANCER EXCISION  "several"   "back of my neck; right eye; clavicle; nose"  . SUBDURAL HEMATOMA EVACUATION VIA CRANIOTOMY  2006  . THYROIDECTOMY     medullary carcinoma  . TONSILLECTOMY      Outpatient Encounter Prescriptions as of 11/29/2015  Medication Sig  . apixaban (ELIQUIS) 2.5 MG TABS tablet Take 1 tablet (2.5 mg total) by mouth 2 (two) times daily.  . carbidopa-levodopa (SINEMET IR) 25-100 MG tablet Take 1 tablet by mouth 4 (four) times daily. At 8, 12, 5 PM, and 9 or 10 PM  . hydrALAZINE (APRESOLINE) 25 MG tablet Take 1 tablet (25 mg total) by mouth 3 (three) times daily.  Marland Kitchen levothyroxine (SYNTHROID, LEVOTHROID) 100 MCG tablet TAKE 1 TABLET EVERY DAY BEFORE BREAKFAST  . losartan-hydrochlorothiazide (HYZAAR) 100-12.5 MG tablet Take 1 tablet by mouth daily.  . Multiple Vitamin (MULTIVITAMIN WITH MINERALS) TABS Take 1 tablet by mouth daily. Reported on 08/01/2015   . Probiotic Product (ALIGN) 4 MG CAPS Take 1 capsule by mouth daily.  . rasagiline (AZILECT) 1 MG TABS tablet Take 1 tablet (1 mg total) by mouth daily.  . simvastatin (ZOCOR) 20 MG tablet Take 1 tablet (20 mg total) by mouth at bedtime.   No facility-administered encounter medications on file as of 11/29/2015.     No Known Allergies   Current Medications, Allergies, Past Medical History, Past Surgical History, Family History, and Social History were reviewed in [employer] record.    Review of Systems       See HPI - all other systems neg except as noted...       The patient complains of dyspnea on exertion, right hand tremor, and difficulty walking.  The patient denies anorexia, fever, weight loss, weight gain, vision loss, decreased hearing, hoarseness, chest pain, syncope, peripheral edema, prolonged cough, headaches, hemoptysis, abdominal pain, melena, hematochezia, severe indigestion/heartburn, hematuria, incontinence, muscle weakness, suspicious skin lesions, transient blindness, depression, unusual weight change, abnormal bleeding, enlarged lymph nodes, and angioedema.     Objective:   Physical Exam     WD, WN, 82 y/o WM in NAD... GENERAL:  Alert & oriented; pleasant & cooperative... HEENT:  [place]/AT, EOM-wnl, PERRLA, EACs-clear, TMs-wnl, NOSE-clear, THROAT-clear & wnl. NECK:  Supple w/ fairROM; no JVD; normal carotid impulses w/o bruits; thyroid area scar, no nodules palp; no lymphadenopathy. CHEST:  Clear to P & A; without wheezes/ rales/ or rhonchi heard... HEART:  Regular Rhythm; without murmurs/ rubs/ or gallops detected... ABDOMEN:  Soft & nontender; normal bowel sounds; no organomegaly or masses palpated... BACK:  Scar of LLam surg... EXT: without deformities, mild arthritic changes; no varicose veins/ +venous insuffic/ no edema. NEURO:  CN's intact;  mod tremor right hand & arm at rest, ?early mask facies, still moves well. DERM:  No lesions noted;  no rash etc...  RADIOLOGY DATA:  Reviewed in the EPIC EMR & discussed  w/ the patient...  LABORATORY DATA:  Reviewed in the EPIC EMR & discussed w/ the patient...   Assessment & Plan:    Abnormal CT Chest>  area of nodularity & pleural based consolidation over the post-medial RLL w/ assoc bronchiectatic changes => PET scan pending for further eval...   PAF>> Brief episode of PAF, converted spont to NSR, on Eliquis E followed by DrHochrein...  HBP>  Hx volatile BP changes=> Losartan increased, then changed to Hyzaar; then added HYDRALAZINE 25Tid & continue to monitor BP at home.  PVD- carotids and AAA> on Eliquis- w/o cerebral ischemic symptoms, & he has appt w/ [facility]- DrBrabham...  HYPERLIPID>  On Simva20 now + FLP followed by [facility], DrLetvak et al...  Hx Medullary Thyroid Carcinoma>  S/p surg by DrNewman2002, on Synthroid 100mg /d now & TSH = 2.94, continue same.  GI> remote hx polyp, divertics, hems>  Last colon 2007 by by DrSam & suggested f/u 46yrs...  GU>  Followed by DrGrapey now, RENAL LESION found incidentally on MRI Lumbar spine & w/u revealed oncocytoma w/ cryoablation per [facility] 3/14 & stable on f/u visits/ scans...  NEURO> Hx TIA, Hx SDH, Tremor/ Parkinson's>  Followed by Gean Quint & now DrAthar on Sinemet & Azilect, improved...  DJD, LBP>  He had decompressive LLam 1/14 by DrHirsh & is recovering nicely...  Other medical issues as noted...   Patient's Medications  New Prescriptions   No medications on file  Previous Medications   APIXABAN (ELIQUIS) 2.5 MG TABS TABLET    Take 1 tablet (2.5 mg total) by mouth 2 (two) times daily.   CARBIDOPA-LEVODOPA (SINEMET IR) 25-100 MG TABLET    Take 1 tablet by mouth 4 (four) times daily. At 8, 12, 5 PM, and 9 or 10 PM   HYDRALAZINE (APRESOLINE) 25 MG TABLET    Take 1 tablet (25 mg total) by mouth 3 (three) times daily.   LEVOTHYROXINE (SYNTHROID, LEVOTHROID) 100 MCG TABLET    TAKE 1 TABLET EVERY DAY BEFORE BREAKFAST    LOSARTAN-HYDROCHLOROTHIAZIDE (HYZAAR) 100-12.5 MG TABLET    Take 1 tablet by mouth daily.   MULTIPLE VITAMIN (MULTIVITAMIN WITH MINERALS) TABS    Take 1 tablet by mouth daily. Reported on 08/01/2015   PROBIOTIC PRODUCT (ALIGN) 4 MG CAPS    Take 1 capsule by mouth daily.   RASAGILINE (AZILECT) 1 MG TABS TABLET    Take 1 tablet (1 mg total) by mouth daily.   SIMVASTATIN (ZOCOR) 20 MG TABLET    Take 1 tablet (20 mg total) by mouth at bedtime.  Modified Medications   No medications on file  Discontinued Medications   No medications on file

## 2015-12-01 DIAGNOSIS — Z7901 Long term (current) use of anticoagulants: Secondary | ICD-10-CM | POA: Insufficient documentation

## 2015-12-06 ENCOUNTER — Ambulatory Visit (HOSPITAL_COMMUNITY)
Admission: RE | Admit: 2015-12-06 | Discharge: 2015-12-06 | Disposition: A | Discharge status: Home / Self Care | Payer: Commercial Managed Care - HMO | Source: Ambulatory Visit | Attending: Pulmonary Disease | Admitting: Pulmonary Disease

## 2015-12-06 DIAGNOSIS — R938 Abnormal findings on diagnostic imaging of other specified body structures: Secondary | ICD-10-CM | POA: Diagnosis not present

## 2015-12-06 DIAGNOSIS — J479 Bronchiectasis, uncomplicated: Secondary | ICD-10-CM | POA: Insufficient documentation

## 2015-12-06 DIAGNOSIS — R9389 Abnormal findings on diagnostic imaging of other specified body structures: Secondary | ICD-10-CM

## 2015-12-06 DIAGNOSIS — R918 Other nonspecific abnormal finding of lung field: Secondary | ICD-10-CM | POA: Diagnosis not present

## 2015-12-06 LAB — GLUCOSE, CAPILLARY: Glucose-Capillary: 86 mg/dL (ref 65–99)

## 2015-12-06 MED ORDER — FLUDEOXYGLUCOSE F - 18 (FDG) INJECTION
8.9900 | Freq: Once | INTRAVENOUS | Status: AC
  Administered 2015-12-06: 8.99 via INTRAVENOUS

## 2015-12-09 ENCOUNTER — Other Ambulatory Visit: Payer: Self-pay | Admitting: Primary Care

## 2015-12-09 DIAGNOSIS — E039 Hypothyroidism, unspecified: Secondary | ICD-10-CM

## 2015-12-13 ENCOUNTER — Encounter: Payer: Self-pay | Admitting: Family Medicine

## 2015-12-13 ENCOUNTER — Ambulatory Visit (INDEPENDENT_AMBULATORY_CARE_PROVIDER_SITE_OTHER): Payer: Commercial Managed Care - HMO | Admitting: Family Medicine

## 2015-12-13 ENCOUNTER — Telehealth: Payer: Self-pay | Admitting: Primary Care

## 2015-12-13 DIAGNOSIS — M79661 Pain in right lower leg: Secondary | ICD-10-CM | POA: Diagnosis not present

## 2015-12-13 DIAGNOSIS — M79669 Pain in unspecified lower leg: Secondary | ICD-10-CM | POA: Insufficient documentation

## 2015-12-13 NOTE — Assessment & Plan Note (Signed)
Discussed with patient about differential diagnosis. He is already anticoagulated, so it is unlikely that he would have a DVT. No history of DVT or PE. If he did have a clot he is already on treatment. He agrees. No need for ultrasound this point. Unlikely then have fracture. No sign of Achilles rupture or full calf tear. No sign of compartment syndrome given that he doesn't have much pain. More likely just has a calf strain. Should respond icing and mild compression with an Ace wrap as needed. Exercise as tolerated. See after this summary. He agrees.

## 2015-12-13 NOTE — Progress Notes (Signed)
He has been doing an exercise program for parkinson's patients.  Last class was 12/11/15, day before yesterday.   R knee pain- wears a brace a baseline for exercise.  Was going for a walk yesterday and then had some leg pain and some R calf stiffness.  On anticoagulation at baseline.  No bruising.  No direct trauma recent but he did step off a step "funny" a few days ago.  He didn't fall.  He caught himself.  Didn't feel a pop or snap.  No bleeding.  No h/o DVT or PE.    Meds, vitals, and allergies reviewed.   ROS: Per HPI unless specifically indicated in ROS section   nad ncat Neck supple rrr ctab abd soft R calf slightly ttp proximally but no bruising.  R calf 37cm, L calf 35cm.  Normal ROM at knee and ankle, no sign of achilles failure or full calf rupture.  Crepitus on ROM R knee but not ttp on joint lines.  Able to bear weight.   Parkinson's tremor at baseline.

## 2015-12-13 NOTE — Progress Notes (Signed)
Pre visit review using our clinic review tool, if applicable. No additional management support is needed unless otherwise documented below in the visit note. 

## 2015-12-13 NOTE — Telephone Encounter (Signed)
Dr Damita Dunnings has already seen pt.

## 2015-12-13 NOTE — Patient Instructions (Signed)
Likely a calf strain.  Continue your baseline meds.   Ice 5 minutes on/off and consider using an ace wrap.  Exercise as tolerated.  Back off if more pain or swelling.  Take care.  Glad to see you.  Update [place] as needed.

## 2015-12-13 NOTE — Telephone Encounter (Signed)
Patient Name: William [name] DOB: 1928-11-24 Initial Comment Caller states that he injured his right knee and it is swollen and painful. pt is on blood thinners Nurse Assessment Nurse: Vallery Sa, RN, Tye [place] Date/Time (Eastern Time): 12/13/2015 2:48:35 PM Confirm and document reason for call. If symptomatic, describe symptoms. You must click the next button to save text entered. ---[name] states he developed mild pain (rated as a 1/2 on the 1 to 10 scale) and mild swelling in his right knee yesterday. No new injury. No fever. Alert and responsive. Has the patient traveled out of the country within the last 30 days? ---No Does the patient have any new or worsening symptoms? ---Yes Will a triage be completed? ---Yes Related visit to physician within the last 2 weeks? ---No Does the PT have any chronic conditions? (i.e. diabetes, asthma, etc.) ---Yes List chronic conditions. ---[name], A-Fib (taking blood thinners) Is this a behavioral health or substance abuse call? ---No Guidelines Guideline Title Affirmed Question Affirmed Notes Knee Swelling [1] Thigh or calf pain AND [2] only 1 side AND [3] present > 1 hour Final Disposition User See Physician within 4 Hours (or PCP triage) Vallery Sa, RN, Tye [place] Comments Scheduled for 3:15pm appointment with Dr. Damita Dunnings. [name] states he lives close by. [name] notified. Referrals REFERRED TO PCP OFFICE Disagree/Comply: Comply

## 2015-12-21 DIAGNOSIS — L570 Actinic keratosis: Secondary | ICD-10-CM | POA: Diagnosis not present

## 2015-12-21 DIAGNOSIS — Z85828 Personal history of other malignant neoplasm of skin: Secondary | ICD-10-CM | POA: Diagnosis not present

## 2015-12-21 DIAGNOSIS — L700 Acne vulgaris: Secondary | ICD-10-CM | POA: Diagnosis not present

## 2015-12-26 ENCOUNTER — Ambulatory Visit (INDEPENDENT_AMBULATORY_CARE_PROVIDER_SITE_OTHER): Payer: Commercial Managed Care - HMO | Admitting: Primary Care

## 2015-12-26 ENCOUNTER — Other Ambulatory Visit: Payer: Self-pay | Admitting: Primary Care

## 2015-12-26 ENCOUNTER — Encounter: Payer: Self-pay | Admitting: Primary Care

## 2015-12-26 VITALS — BP 116/66 | HR 60 | Temp 98.7°F | Ht 76.0 in | Wt 181.8 lb

## 2015-12-26 DIAGNOSIS — E785 Hyperlipidemia, unspecified: Secondary | ICD-10-CM

## 2015-12-26 DIAGNOSIS — G20 Parkinson's disease: Secondary | ICD-10-CM

## 2015-12-26 DIAGNOSIS — E039 Hypothyroidism, unspecified: Secondary | ICD-10-CM | POA: Diagnosis not present

## 2015-12-26 DIAGNOSIS — Z23 Encounter for immunization: Secondary | ICD-10-CM

## 2015-12-26 DIAGNOSIS — I480 Paroxysmal atrial fibrillation: Secondary | ICD-10-CM | POA: Diagnosis not present

## 2015-12-26 DIAGNOSIS — E890 Postprocedural hypothyroidism: Secondary | ICD-10-CM

## 2015-12-26 LAB — TSH: TSH: 4.51 u[IU]/mL — ABNORMAL HIGH (ref 0.35–4.50)

## 2015-12-26 NOTE — Progress Notes (Signed)
Pre visit review using our clinic review tool, if applicable. No additional management support is needed unless otherwise documented below in the visit note. 

## 2015-12-26 NOTE — Assessment & Plan Note (Signed)
2 falls since last visit. Working with exercise therapy to help with balance and gait. Discussed safety in the home in regards to fall prevention. Follows with neurology every 6 months. Continue current regimen.

## 2015-12-26 NOTE — Assessment & Plan Note (Signed)
Rate and rhythm regular today. Continue Eliquis. 

## 2015-12-26 NOTE — Assessment & Plan Note (Signed)
TSH in May 2016 stable, due for recheck today. Continue levothyroxine 100 mcg for now.

## 2015-12-26 NOTE — Assessment & Plan Note (Signed)
Lipids in March 2017 stable. Continue Zocor.

## 2015-12-26 NOTE — Patient Instructions (Signed)
Complete lab work prior to leaving today. I will notify you of your results once received.   Follow up in March 2018 for your annual Medicare Wellness Exam.  It was a pleasure to see you today!

## 2015-12-26 NOTE — Progress Notes (Signed)
Subjective:    Patient ID: William [name], male    DOB: 04/26/1928, 82 y.o.   MRN: [id number]  HPI  William [name] is an 82 year old male who presents today for follow up.  1) Atrial Fibrillation: Currently mangaed on Eliquis 2.5 mg BID. He currently manages with Cardiologist and last visit was 2 weeks ago. Denies chest pain. No changes made to treatment plan.  2) Hypothyroidism: Currently managed on levothyroxine 100 mcg. His last TSH in May 2016 was normal. He is due for recheck. Denies fatigue, hair loss, weight loss or gain.  3) Essential Hypertension: Currently managed on hydralazine 25 mg, Hyzaar 100-12.5 mg. His blood pressure in the clinic is stable today. Denies headaches and chest pain.  4) Parkinsons Disease: Currently managed on Sinemet IR and Azilect. Follows with neurologist every 6 months. He's fallen twice, once in the bath tub and then again as he slipped down his steps on the back porch. Denies injury from falls. He is participating in exercise therapy to maintain balance and posture.  5) Hyperlipidemia: Currently manage don Zocor 20 mg. Lipid panel in March 2017 stable.     Review of Systems  Constitutional: Negative for fatigue.  Respiratory: Negative for shortness of breath.   Cardiovascular: Negative for chest pain and palpitations.  Endocrine: Negative for cold intolerance.  Neurological: Negative for weakness.       Past Medical History:  Diagnosis Date  . A-fib ([place])   . Arthritis    "right knee" (08/09/2014)  . Basal cell carcinoma   . Benign prostatic hypertrophy with urinary obstruction   . Complication of anesthesia    "difficulty intubation, had to use fiberoptic 2006"  . Difficult intubation   . DJD (degenerative joint disease)   . Gout   . Hx of colonic polyps   . Hyperlipidemia   . Hypertension    sees [name]  . Hypothyroidism   . Kidney tumor    "tumor on cyst on kidney"   . 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 Mini stroke ([place])   . Parkinson disease ([place])   . Pneumonia    hx of in 1952  . Primary skin squamous cell carcinoma   . Subdural hematoma ([place]) 2006  . TIA (transient ischemic attack)    pt does not recall this hx "but may have had one today" (08/09/2014)     Social History   Social History  . Marital status: Widowed    Spouse name: N/A  . Number of children: 3  . Years of education: 15   Occupational History  . retired    Social History Main Topics  . Smoking status: Former Smoker    Packs/day: 0.80    Years: 15.00    Types: Cigarettes    Quit date: 04/15/1965  . Smokeless tobacco: Never Used  . Alcohol use 6.0 oz/week    10 Glasses of wine per week     Comment: 1 glass of wine every night (6oz glass)  . Drug use: No  . Sexual activity: Yes   Other Topics Concern  . Not on file   Social History Narrative   Married.   Lives at home with significant other.   Retried. 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   Enjoys golfing, yard work, spending time with family.         Past Surgical History:  Procedure Laterality Date  . APPENDECTOMY    .  BACK SURGERY    . CATARACT EXTRACTION W/ INTRAOCULAR LENS  IMPLANT, BILATERAL Bilateral ~ 1998  . CHOLECYSTECTOMY    . CRYOABLATION  07-10-12   "tumor on cyst on my kidney; had an ablation"  . EYE SURGERY    . INGUINAL HERNIA REPAIR Right   . KIDNEY SURGERY    . KNEE ARTHROSCOPY Right   . LUMBAR LAMINECTOMY/DECOMPRESSION MICRODISCECTOMY  05/14/2012   Procedure: LUMBAR LAMINECTOMY/DECOMPRESSION MICRODISCECTOMY 1 LEVEL;  Surgeon: [name], MD;  Location: [facility];  Service: Neurosurgery;  Laterality: Left;  Left Lumbar four-five Laminectomy/Diskectomy/Far lateral diskectomy  . SKIN CANCER EXCISION  "several"   "back of my neck; right eye; clavicle; nose"  . SUBDURAL HEMATOMA EVACUATION VIA CRANIOTOMY  2006  . THYROIDECTOMY     medullary carcinoma  . TONSILLECTOMY      Family History  Problem  Relation Age of Onset  . Stroke Mother   . Stroke Father   . Heart disease Father   . Alzheimer's disease Brother   . Alzheimer's disease Sister     No Known Allergies  Current Outpatient Prescriptions on File Prior to Visit  Medication Sig Dispense Refill  . apixaban (ELIQUIS) 2.5 MG TABS tablet Take 1 tablet (2.5 mg total) by mouth 2 (two) times daily. 180 tablet 3  . carbidopa-levodopa (SINEMET IR) 25-100 MG tablet Take 1 tablet by mouth 4 (four) times daily. At 8, 12, 5 PM, and 9 or 10 PM 360 tablet 3  . hydrALAZINE (APRESOLINE) 25 MG tablet Take 1 tablet (25 mg total) by mouth 3 (three) times daily. 90 tablet 3  . levothyroxine (SYNTHROID, LEVOTHROID) 100 MCG tablet TAKE 1 TABLET EVERY DAY BEFORE BREAKFAST 30 tablet 0  . losartan-hydrochlorothiazide (HYZAAR) 100-12.5 MG tablet Take 1 tablet by mouth daily. 90 tablet 3  . Multiple Vitamin (MULTIVITAMIN WITH MINERALS) TABS Take 1 tablet by mouth daily. Reported on 08/01/2015    . Probiotic Product (ALIGN) 4 MG CAPS Take 1 capsule by mouth daily.    . rasagiline (AZILECT) 1 MG TABS tablet Take 1 tablet (1 mg total) by mouth daily. 90 tablet 3  . simvastatin (ZOCOR) 20 MG tablet Take 1 tablet (20 mg total) by mouth at bedtime. 90 tablet 3  . [DISCONTINUED] losartan (COZAAR) 50 MG tablet Take 1 tablet (50 mg total) by mouth daily. 30 tablet 1   No current facility-administered medications on file prior to visit.     BP 116/66   Pulse 60   Temp 98.7 F (37.1 C) (Oral)   Ht 6\' 4"  (1.93 m)   Wt 181 lb 12.8 oz (82.5 kg)   SpO2 97%   BMI 22.13 kg/m    Objective:   Physical Exam  Constitutional: He is oriented to person, place, and time. He appears well-nourished.  Neck: Neck supple. No thyromegaly present.  Cardiovascular: Normal rate and regular rhythm.   Pulmonary/Chest: Effort normal and breath sounds normal.  Neurological: He is alert and oriented to person, place, and time.  Steady gait  Skin: Skin is warm and dry.  Mild  bruising to left forearm.           Assessment & Plan:

## 2016-01-12 ENCOUNTER — Encounter: Payer: Self-pay | Admitting: Interventional Radiology

## 2016-01-31 ENCOUNTER — Telehealth: Payer: Self-pay | Admitting: Primary Care

## 2016-01-31 NOTE — Telephone Encounter (Signed)
Pt has appt with Allie Bossier NP on 02/01/16 at 10:00.

## 2016-01-31 NOTE — Telephone Encounter (Signed)
[facility]  Patient Name: William [name]  DOB: April 26, 1928    Initial Comment Caller has had 200/100 readings. Caller took his BP again after medication and it was 137/80. Should the caller come in to check BP on the office monitor? The caller has taken his medication as prescribed.    Nurse Assessment  Nurse: Wayne Sever, RN, Tillie Rung Date/Time (Eastern Time): 01/31/2016 11:32:26 AM  Confirm and document reason for call. If symptomatic, describe symptoms. You must click the next button to save text entered. ---Caller states he has been having irregular readings of his BP. He states BP was 200/101. He then took his BP pill and it's back down to normal. He states his machine is old and he is wondering if that is problem. Caller states he is feeling fine.  Has the patient traveled out of the country within the last 30 days? ---Not Applicable  Does the patient have any new or worsening symptoms? ---Yes  Will a triage be completed? ---Yes  Related visit to physician within the last 2 weeks? ---No  Does the PT have any chronic conditions? (i.e. diabetes, asthma, etc.) ---Yes  List chronic conditions. ---HTN, Parkinson's  Is this a behavioral health or substance abuse call? ---No     Guidelines    Guideline Title Affirmed Question Affirmed Notes  High Blood Pressure [1] BP ? 140/90 AND [2] taking BP medications    Final Disposition User   See PCP within [place], RN, Tillie Rung    Comments  Scheduled with Alma Friendly at 1000am on 10/19   Referrals  REFERRED TO PCP OFFICE   Disagree/Comply: Comply

## 2016-01-31 NOTE — Telephone Encounter (Signed)
Noted  

## 2016-02-01 ENCOUNTER — Ambulatory Visit (INDEPENDENT_AMBULATORY_CARE_PROVIDER_SITE_OTHER): Payer: Commercial Managed Care - HMO | Admitting: Primary Care

## 2016-02-01 ENCOUNTER — Encounter: Payer: Self-pay | Admitting: Primary Care

## 2016-02-01 VITALS — BP 110/62 | HR 64 | Temp 98.1°F | Ht 76.0 in | Wt 181.8 lb

## 2016-02-01 DIAGNOSIS — G20 Parkinson's disease: Secondary | ICD-10-CM

## 2016-02-01 DIAGNOSIS — E890 Postprocedural hypothyroidism: Secondary | ICD-10-CM

## 2016-02-01 DIAGNOSIS — I10 Essential (primary) hypertension: Secondary | ICD-10-CM | POA: Diagnosis not present

## 2016-02-01 DIAGNOSIS — E039 Hypothyroidism, unspecified: Secondary | ICD-10-CM

## 2016-02-01 LAB — VITAMIN D 25 HYDROXY (VIT D DEFICIENCY, FRACTURES): VITD: 21.5 ng/mL — AB (ref 30.00–100.00)

## 2016-02-01 LAB — TSH: TSH: 3.98 u[IU]/mL (ref 0.35–4.50)

## 2016-02-01 NOTE — Progress Notes (Signed)
Pre visit review using our clinic review tool, if applicable. No additional management support is needed unless otherwise documented below in the visit note. 

## 2016-02-01 NOTE — Assessment & Plan Note (Signed)
BP in the office stable with manual check, even on recheck 20 minutes later. Suspect BP cuff/machine is faulty and highly recommended he replace his cuff. Due for TSH recheck today, pending. Exam unremarkable. Will have him continue to monitor BP once daily after rest and report readings at or above 150/90. He is also to report symptoms of fatigue, chest pain, etc.

## 2016-02-01 NOTE — Patient Instructions (Signed)
Your blood pressure looks good today.  I recommend replacing your blood pressure machine.   Keep an eye on your blood pressure once daily after resting for 30 minutes. Please notify me if you see numbers at or above 150/90 on a consistent basis.   Please also notify me if you develop blurred vision, fatigue, chest pain, shortness of breath.  It was a pleasure to see you today!

## 2016-02-01 NOTE — Progress Notes (Signed)
Subjective:    Patient ID: William [name], male    DOB: August 03, 1928, 82 y.o.   MRN: [id number]  HPI  William [name] is an 82 year old male with a history of hypertension who presents today with a chief complaint of hypertension.   He called into team health on 01/31/16 with reports of BP at 200/101 at 8:30 am. He did noticed some blurred vision and had not taken his BP medication at that point. He took his blood pressure medication, rechecked his BP within 1 hour which was in the normal range.   He's checked his BP every several hours since yesterday which ranges from 90-180's/80's-100's. He's been under a lot of stress as he was recently caring for his granddaughter who is type 1 diabetic. His machine is 2-78 years old. His BP in the office today is 130/68 manually and 92/50 on his machine. His BP upon recheck manually 20 minutes later was 110/62.   Today he denies blurred vision, chest pain, shortness of breath, fatigue, dizziness.  Review of Systems  Constitutional: Negative for fatigue.  Eyes: Negative for visual disturbance.  Respiratory: Negative for shortness of breath.   Cardiovascular: Negative for chest pain.  Neurological: Negative for dizziness.       Past Medical History:  Diagnosis Date  . A-fib ([place])   . Arthritis    "right knee" (08/09/2014)  . Basal cell carcinoma   . Benign prostatic hypertrophy with urinary obstruction   . Complication of anesthesia    "difficulty intubation, had to use fiberoptic 2006"  . Difficult intubation   . DJD (degenerative joint disease)   . Gout   . Hx of colonic polyps   . Hyperlipidemia   . Hypertension    sees [name]  . Hypothyroidism   . Kidney tumor    "tumor on cyst on kidney"   . 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 Mini stroke ([place])   . Parkinson disease ([place])   . Pneumonia    hx of in 1952  . Primary skin squamous cell carcinoma   . Subdural hematoma ([place]) 2006  . TIA  (transient ischemic attack)    pt does not recall this hx "but may have had one today" (08/09/2014)     Social History   Social History  . Marital status: Widowed    Spouse name: N/A  . Number of children: 3  . Years of education: 15   Occupational History  . retired    Social History Main Topics  . Smoking status: Former Smoker    Packs/day: 0.80    Years: 15.00    Types: Cigarettes    Quit date: 04/15/1965  . Smokeless tobacco: Never Used  . Alcohol use 6.0 oz/week    10 Glasses of wine per week     Comment: 1 glass of wine every night (6oz glass)  . Drug use: No  . Sexual activity: Yes   Other Topics Concern  . Not on file   Social History Narrative   Married.   Lives at home with significant other.   Retried. 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   Enjoys golfing, yard work, spending time with family.         Past Surgical History:  Procedure Laterality Date  . APPENDECTOMY    . BACK SURGERY    . CATARACT EXTRACTION W/ INTRAOCULAR LENS  IMPLANT, BILATERAL Bilateral ~ 1998  . CHOLECYSTECTOMY    .  CRYOABLATION  07-10-12   "tumor on cyst on my kidney; had an ablation"  . EYE SURGERY    . INGUINAL HERNIA REPAIR Right   . IR GENERIC HISTORICAL  11/09/2015   IR RADIOLOGIST EVAL & MGMT 11/09/2015 [name], MD [facility] INTERV RAD  . KIDNEY SURGERY    . KNEE ARTHROSCOPY Right   . LUMBAR LAMINECTOMY/DECOMPRESSION MICRODISCECTOMY  05/14/2012   Procedure: LUMBAR LAMINECTOMY/DECOMPRESSION MICRODISCECTOMY 1 LEVEL;  Surgeon: [name], MD;  Location: [facility];  Service: Neurosurgery;  Laterality: Left;  Left Lumbar four-five Laminectomy/Diskectomy/Far lateral diskectomy  . SKIN CANCER EXCISION  "several"   "back of my neck; right eye; clavicle; nose"  . SUBDURAL HEMATOMA EVACUATION VIA CRANIOTOMY  2006  . THYROIDECTOMY     medullary carcinoma  . TONSILLECTOMY      Family History  Problem Relation Age of Onset  . Stroke Mother   . Stroke Father   . Heart  disease Father   . Alzheimer's disease Brother   . Alzheimer's disease Sister     No Known Allergies  Current Outpatient Prescriptions on File Prior to Visit  Medication Sig Dispense Refill  . apixaban (ELIQUIS) 2.5 MG TABS tablet Take 1 tablet (2.5 mg total) by mouth 2 (two) times daily. 180 tablet 3  . carbidopa-levodopa (SINEMET IR) 25-100 MG tablet Take 1 tablet by mouth 4 (four) times daily. At 8, 12, 5 PM, and 9 or 10 PM 360 tablet 3  . hydrALAZINE (APRESOLINE) 25 MG tablet Take 1 tablet (25 mg total) by mouth 3 (three) times daily. 90 tablet 3  . levothyroxine (SYNTHROID, LEVOTHROID) 100 MCG tablet TAKE 1 TABLET EVERY DAY BEFORE BREAKFAST 30 tablet 0  . losartan-hydrochlorothiazide (HYZAAR) 100-12.5 MG tablet Take 1 tablet by mouth daily. 90 tablet 3  . Multiple Vitamin (MULTIVITAMIN WITH MINERALS) TABS Take 1 tablet by mouth daily. Reported on 08/01/2015    . Probiotic Product (ALIGN) 4 MG CAPS Take 1 capsule by mouth daily.    . rasagiline (AZILECT) 1 MG TABS tablet Take 1 tablet (1 mg total) by mouth daily. 90 tablet 3  . simvastatin (ZOCOR) 20 MG tablet Take 1 tablet (20 mg total) by mouth at bedtime. 90 tablet 3  . [DISCONTINUED] losartan (COZAAR) 50 MG tablet Take 1 tablet (50 mg total) by mouth daily. 30 tablet 1   No current facility-administered medications on file prior to visit.     BP 110/62   Pulse 64   Temp 98.1 F (36.7 C) (Oral)   Ht 6\' 4"  (1.93 m)   Wt 181 lb 12.8 oz (82.5 kg)   SpO2 95%   BMI 22.13 kg/m    Objective:   Physical Exam  Constitutional: He appears well-nourished.  Neck: Neck supple.  Cardiovascular: Normal rate and regular rhythm.   Pulmonary/Chest: Effort normal and breath sounds normal.  Skin: Skin is warm and dry.          Assessment & Plan:

## 2016-02-01 NOTE — Assessment & Plan Note (Signed)
TSH pending, due for recheck.

## 2016-02-14 ENCOUNTER — Other Ambulatory Visit: Payer: Self-pay | Admitting: Primary Care

## 2016-02-14 DIAGNOSIS — E039 Hypothyroidism, unspecified: Secondary | ICD-10-CM

## 2016-03-05 ENCOUNTER — Ambulatory Visit (INDEPENDENT_AMBULATORY_CARE_PROVIDER_SITE_OTHER): Payer: Commercial Managed Care - HMO | Admitting: Neurology

## 2016-03-05 ENCOUNTER — Encounter: Payer: Self-pay | Admitting: Neurology

## 2016-03-05 VITALS — BP 120/58 | HR 68 | Resp 18 | Ht 76.0 in | Wt 181.0 lb

## 2016-03-05 DIAGNOSIS — I480 Paroxysmal atrial fibrillation: Secondary | ICD-10-CM

## 2016-03-05 DIAGNOSIS — G20 Parkinson's disease: Secondary | ICD-10-CM | POA: Diagnosis not present

## 2016-03-05 MED ORDER — CARBIDOPA-LEVODOPA 25-100 MG PO TABS: 1.5000 | ORAL_TABLET | Freq: Four times a day (QID) | ORAL | 3 refills | Status: DC

## 2016-03-05 MED ORDER — RASAGILINE MESYLATE 1 MG PO TABS: 1.0000 mg | ORAL_TABLET | Freq: Every day | ORAL | 3 refills | Status: DC

## 2016-03-05 NOTE — Patient Instructions (Addendum)
We will increase your Sinemet to 1 1/2 pills 4 times a day, at 8, 12, 4 PM and 8 PM.  We will continue your Azilect (generic) once daily.  We will see you back in 4 months.

## 2016-03-05 NOTE — Progress Notes (Signed)
Subjective:    Patient ID: William [name] is a 82 y.o. male.  HPI     Interim history:   [name] is an 82 year old right-handed gentleman with an underlying medical history of right TIA in January 2003, SDH (s/p left craniotomy in August 2006), hypertension, hypothyroidism, thyroid cancer, partial onset seizures (off Dilantin), lumbar spine disease, status post lower back surgery at L4-5 in January 2014, renal tumor, status post kidney tumor ablative surgery in March 2014, who presents for followup consultation of his right-sided predominant [name]. He is accompanied by [name] (longterm GF) again today. I last saw him on 10/26/2015, at which time he reported doing quite well, had some stumbling episodes, no actual falls, was trying to drink enough water and was still physically active. He had signed up for rock steady boxing classes but [name] noted that he would get too exhausted.  Today, 03/05/2016: He reports doing okay. No recent falls, tries to hydrate. Rock steady boxing has helped, As long as he avoids the strenuous parts. He is contemplating participation in a new drug study at [facility]. This will investigate apomorphine sublingual to decrease off time. He takes Sinemet 4 times a day, he continues to take Azilect once daily, tremor seems to have become worse, sometimes also noticeable in the left upper extremity.   Previously:    I saw him on 06/22/2015, at which time he reported doing fairly well. He had finished outpatient OT, PT, and ST. Unfortunately, he did take a fall in the garage couple of months prior as he was coming in from the graduate, stepped backwards on the stairs and slipped off falling backwards, landed on his behind, head struck the car, he denied loss of consciousness or headache or bruise and no sequelae were reported. He was having rails installed in the garage entrance. Was still trying to stay active, playing golf. We mutually agreed to continue  with Sinemet 4 times a day and Azilect once daily. We talked about fall risk and gait safety and fall precautions.   I saw him on 02/23/2015, at which time he reported doing fairly well. He was playing golf regularly. His right knee was bothering him. He had no recent falls. He was trying to hydrate well enough. He had no new issues with A. fib and no new complaints otherwise. I noted right knee swelling. He was advised to follow-up with his primary care physician for this. We mutually agreed to keep his Parkinson's medications the same.    I saw him on 11/01/2014 at which time he reported a recent diagnosis with A. fib. He was admitted to the hospital in April. I reviewed the hospital records from 07/17/2014 through 07/18/2014. He was started on Xarelto. He was then re-admitted on 08/09/14 to 08/10/14 due to altered mental status and slurring of speech and was suspected to have a TIA. He was seen by neuro in consultation and a baby aspirin was added to Xarelto. He presented back to the ER on 08/18/14. He a [place] wo contrast on  08/18/2014 , which was negative for any acute changes. He was seen by the Neurologist in the emergency room and it was felt that a full TIA workup was not necessary at the time. He had presented with hypertension and some slurring of speech. He was switched from Xarelto to Eliquis, because the Xarelto is not on formulary at the [place].  He endorsed recent stressors, including the recent passing of his younger brother a week  prior with advanced Alzheimer's disease.    He had an MRI brain and MRA head on 08/09/14: MRI HEAD: No acute intracranial process, specifically no acute ischemia. Involutional changes. Mild white matter changes suggest chronic small vessel ischemic disease. MRA HEAD: No acute large vessel occlusion or high-grade stenosis. Mid grade stenosis RIGHT P2/3 segment.    I suggested we continue with Sinemet at 4 times a day. He was advised to continue with Azilect as well. He was  advised to drink more water. In the interim, we restarted outpatient physical therapy, occupational therapy and speech therapy.   I saw him on 04/29/2014, at which time he reported doing well overall but he was more fatigued and had some excessive daytime somnolence. Memory was stable. His mood was stable. He noted some blood pressure fluctuations. Sometimes he had a dull headache and sometimes he felt lightheaded. He had some anxiety over his lady friend's health, as she had fallen and broken rib. He continued to walk regularly and played golf. He had no recent falls. I asked him to continue with his medications, Azilect once daily and Sinemet 4 times a day.   I saw him on 10/28/2013, at which time he reported doing well. In particular, had no cognitive complaints, no mood issues, and continued to play golf 3 times a week. He was driving well. I suggested an increase in his levodopa to 1 pill 4 times a day of the 25-100 milligrams strength. [facility] and had seen a [place] neurologist as well.      I saw him on 12/07/2012, at which time I felt that he was doing well on Azilect and levodopa. He called in December 2014 with problems with blood pressure fluctuations. He was wondering if this came from the [place] but I did not think it was due to the Azilect per se. I was reluctant to take him off of it.    I first met him on 06/03/2012 and he previously followed by Dr. Morene Antu. He has had primarily right-sided symptoms with regards to his Parkinson's, diagnosed in 2010 with symptoms dating back to late 2009 or early 2010. He had briefly tried Mirapex but was taken off d/t hypotension. L spine MRI in June 2013 showed renal cysts, degenerative joint disease most prominent at L4-5. He tried acupuncture. His MRI of the lumbar spine showed abnormalities with his kidney with a cyst and a tumor and he had ablative surgery on 07/10/12 and had a FU CT done. He had lower back  surgery on 05/14/12.  I saw him back on 09/03/2012 and I suggested starting Azilect. He has been tolerating it well and both he and his girlfriend felt that he did better with it in terms of dexterity and fine motor control. He had some hypotension and lightheadedness and reduced his BP medication. He has been having some issue with gout.     His Past Medical History Is Significant For: Past Medical History:  Diagnosis Date  . A-fib ([place])   . Arthritis    "right knee" (08/09/2014)  . Basal cell carcinoma   . Benign prostatic hypertrophy with urinary obstruction   . Complication of anesthesia    "difficulty intubation, had to use fiberoptic 2006"  . Difficult intubation   . DJD (degenerative joint disease)   . Gout   . Hx of colonic polyps   . Hyperlipidemia   . Hypertension    sees [name]  .  Hypothyroidism   . Kidney tumor    "tumor on cyst on kidney"   . 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 Mini stroke ([place])   . Parkinson disease ([place])   . Pneumonia    hx of in 1952  . Primary skin squamous cell carcinoma   . Subdural hematoma ([place]) 2006  . TIA (transient ischemic attack)    pt does not recall this hx "but may have had one today" (08/09/2014)    His Past Surgical History Is Significant For: Past Surgical History:  Procedure Laterality Date  . APPENDECTOMY    . BACK SURGERY    . CATARACT EXTRACTION W/ INTRAOCULAR LENS  IMPLANT, BILATERAL Bilateral ~ 1998  . CHOLECYSTECTOMY    . CRYOABLATION  07-10-12   "tumor on cyst on my kidney; had an ablation"  . EYE SURGERY    . INGUINAL HERNIA REPAIR Right   . IR GENERIC HISTORICAL  11/09/2015   IR RADIOLOGIST EVAL & MGMT 11/09/2015 [name], MD [facility] INTERV RAD  . KIDNEY SURGERY    . KNEE ARTHROSCOPY Right   . LUMBAR LAMINECTOMY/DECOMPRESSION MICRODISCECTOMY  05/14/2012   Procedure: LUMBAR LAMINECTOMY/DECOMPRESSION MICRODISCECTOMY 1 LEVEL;  Surgeon: [name], MD;  Location: [facility];  Service: Neurosurgery;  Laterality: Left;  Left Lumbar four-five Laminectomy/Diskectomy/Far lateral diskectomy  . SKIN CANCER EXCISION  "several"   "back of my neck; right eye; clavicle; nose"  . SUBDURAL HEMATOMA EVACUATION VIA CRANIOTOMY  2006  . THYROIDECTOMY     medullary carcinoma  . TONSILLECTOMY      His Family History Is Significant For: Family History  Problem Relation Age of Onset  . Stroke Mother   . Stroke Father   . Heart disease Father   . Alzheimer's disease Brother   . Alzheimer's disease Sister     His Social History Is Significant For: Social History   Social History  . Marital status: Widowed    Spouse name: N/A  . Number of children: 3  . Years of education: 15   Occupational History  . retired    Social History Main Topics  . Smoking status: Former Smoker    Packs/day: 0.80    Years: 15.00    Types: Cigarettes    Quit date: 04/15/1965  . Smokeless tobacco: Never Used  . Alcohol use 6.0 oz/week    10 Glasses of wine per week     Comment: 1 glass of wine every night (6oz glass)  . Drug use: No  . Sexual activity: Yes   Other Topics Concern  . None   Social History Narrative   Married.   Lives at home with significant other.   Retried. 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   Enjoys golfing, yard work, spending time with family.         His Allergies Are:  No Known Allergies:   His Current Medications Are:  Outpatient Encounter Prescriptions as of 03/05/2016  Medication Sig  . apixaban (ELIQUIS) 2.5 MG TABS tablet Take 1 tablet (2.5 mg total) by mouth 2 (two) times daily.  . carbidopa-levodopa (SINEMET IR) 25-100 MG tablet Take 1 tablet by mouth 4 (four) times daily. At 8, 12, 5 PM, and 9 or 10 PM  . hydrALAZINE (APRESOLINE) 25 MG tablet Take 1 tablet (25 mg total) by mouth 3 (three) times daily.  Marland Kitchen levothyroxine (SYNTHROID, LEVOTHROID) 100 MCG tablet TAKE 1 TABLET EVERY DAY BEFORE BREAKFAST  .  losartan-hydrochlorothiazide  (HYZAAR) 100-12.5 MG tablet Take 1 tablet by mouth daily.  . Multiple Vitamin (MULTIVITAMIN WITH MINERALS) TABS Take 1 tablet by mouth daily. Reported on 08/01/2015  . Probiotic Product (ALIGN) 4 MG CAPS Take 1 capsule by mouth daily.  . rasagiline (AZILECT) 1 MG TABS tablet Take 1 tablet (1 mg total) by mouth daily.  . simvastatin (ZOCOR) 20 MG tablet Take 1 tablet (20 mg total) by mouth at bedtime.   No facility-administered encounter medications on file as of 03/05/2016.   :  Review of Systems:  Out of a complete 14 point review of systems, all are reviewed and negative with the exception of these symptoms as listed below:  Review of Systems  Neurological:       Patient states that he has signed up for a medical study and would like to discuss it with Dr. Rexene Alberts.  Patient would also like to discuss possibly increasing his medication dose.     Objective:  Neurologic Exam  Physical Exam Physical Examination:   Vitals:   03/05/16 1035  BP: (!) 120/58  Pulse: 68  Resp: 18   General Examination: The patient is a very pleasant 82 y.o. male in no acute distress. He has no dizziness or lightheadedness upon standing. He is in good spirits today, as usual.   HEENT: He has moderate degree of nuchal rigidity with some decrease in passive range of motion. He reports no neck pain. Face is symmetric with decrease in facial animation, moderate facial masking. Speech is moderately hypophonic and mildly dysarthric. Hearing is grossly intact. Extraocular tracking is fairly good with mild saccadic breakdown. Pupils are reactive to light. Airway is clear with mild to moderate mouth dryness noted. Skin cancer removal noticeable on the forehead. Chest is clear to auscultation without wheezing, or rhonchi or crackles noted.  Heart sounds are normal without murmurs, rubs or gallops.  Abdomen is soft, nontender with normal bowel sounds.  He has no pitting edema in the distal lower extremities, denies knee  pain. Skin is warm and dry, skin turgor a little low. Skin is dry.  Neurologically: Mental status: The patient is awake, alert and oriented in all 4 spheres. His memory, attention, language and knowledge are appropriate. He has no aphasia, agnosia, apraxia or anomia. Cranial nerves are as described under HEENT exam. Motor exam: Normal bulk and strength is noted. He has increased tone in the right upper extremity and to a lesser degree in the right lower extremity. Mild cogwheeling is noted, right upper extremity more than left upper extremity, he has a moderate degree of resting tremor in the right upper extremity, very slight intermittent resting tremor noted in the left upper extremity, mild postural tremor, right more than left. No lower extremity tremor noted.   Fine motor skills with finger taps and hand movements are moderately impaired on the right and mild to moderately impaired on the left. He stands up with mild difficulty from the seated position and has to push himself up. He has a mildly stooped posture. He walks with decreased arm swing and a little bit more caution today. Balance is mildly impaired. He has more insecurity returns today. Romberg is negative. Reflexes are 1+ throughout.there is no evidence of dyskinesias. Sensory exam is intact to light touch throughout.  Assessment and Plan:   In summary, Mr. Trow is a very pleasant 82 year old gentleman with right-sided predominant, tremor predominant [name], in its 8 th year with symptoms dating back to late 2009  or early 2010, diagnosed in 2010 by Dr. Erling Cruz. He has a complex medical history and is status post lower back surgery at level L4/5 in January 2014, and kidney tumor ablative surgery in March 2014 and more recent diagnosis of atrial fibrillation in April 2016 with an admission in early April 2016 as well as TIA diagnosis in late April 2016. He is currently on Eliquis. He has had no further episodes of A. Fib, as far  as he can tell and he regularly sees his cardiologist with appointment pending for next month. He had an ER visit in early May 2016 due to hypertension and slurring of speech. Parkinson's symptoms and exam  have been fairly stable and he has been responsive to Sinemet, lower dose. He has had some progression in his symptoms with time and exam shows mild progression as well. He has been participating and [place] boxing classes which he believes have been beneficial, he stays active otherwise. He has been followed by cardiology on a regular basis. He is encouraged to participate in the medication trial at [facility] if he is a candidate. From my end of things, I suggested we increase his Sinemet to 1-1/2 pills 4 times a day. I adjusted his prescription and we will continue with generic Azilect once daily at this time. His prescriptions will be faxed to the [place]. I would like to see him back in 4 months, sooner as needed. He is planning a trip to [place] in March and from my end of things I do not foresee any problems with air travel. He is encouraged to discuss this with his cardiologist as well. I answered all their questions today and the patient and [name] were in agreement.  I spent 25 minutes in total face-to-face time with the patient, more than 50% of which was spent in counseling and coordination of care, reviewing test results, reviewing medication and discussing or reviewing the diagnosis of PD, the prognosis and treatment options.

## 2016-03-28 ENCOUNTER — Ambulatory Visit (INDEPENDENT_AMBULATORY_CARE_PROVIDER_SITE_OTHER): Payer: Commercial Managed Care - HMO | Admitting: Family Medicine

## 2016-03-28 ENCOUNTER — Encounter: Payer: Self-pay | Admitting: Family Medicine

## 2016-03-28 VITALS — BP 122/70 | HR 72 | Temp 97.4°F | Wt 179.0 lb

## 2016-03-28 DIAGNOSIS — M25551 Pain in right hip: Secondary | ICD-10-CM | POA: Diagnosis not present

## 2016-03-28 DIAGNOSIS — M62838 Other muscle spasm: Secondary | ICD-10-CM | POA: Diagnosis not present

## 2016-03-28 NOTE — Progress Notes (Signed)
Pre visit review using our clinic review tool, if applicable. No additional management support is needed unless otherwise documented below in the visit note. 

## 2016-03-28 NOTE — Progress Notes (Signed)
Subjective:    Patient ID: William [name], male    DOB: [birth year], 82 y.o.   MRN: [id number]  HPI This is an 82 yo male, accompanied by his wife, who presents today with pain in his right hip and buttock x 1 week. Pain started when he bent over to tie his shoe. Earlier that day, he was throwing a hammer on a string into the trees to hang his Christmas lights. He has had about 3 episodes of spasm yesterday. Each day, number of spasm and intensity have decreased. No episodes today. No interference with sleep. Wife applied some of her diclofenac gel with good relief.  He attends [place] classes at [employer] twice a week. Has noticed improvement in balance and stamina.   Past Medical History:  Diagnosis Date  . A-fib ([place])   . Arthritis    "right knee" (08/09/2014)  . Basal cell carcinoma   . Benign prostatic hypertrophy with urinary obstruction   . Complication of anesthesia    "difficulty intubation, had to use fiberoptic 2006"  . Difficult intubation   . DJD (degenerative joint disease)   . Gout   . Hx of colonic polyps   . Hyperlipidemia   . Hypertension    sees [name]  . Hypothyroidism   . Kidney tumor    "tumor on cyst on kidney"   . 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 Mini stroke ([place])   . Parkinson disease ([place])   . Pneumonia    hx of in 1952  . Primary skin squamous cell carcinoma   . Subdural hematoma ([place]) 2006  . TIA (transient ischemic attack)    pt does not recall this hx "but may have had one today" (08/09/2014)   Past Surgical History:  Procedure Laterality Date  . APPENDECTOMY    . BACK SURGERY    . CATARACT EXTRACTION W/ INTRAOCULAR LENS  IMPLANT, BILATERAL Bilateral ~ 1998  . CHOLECYSTECTOMY    . CRYOABLATION  07-10-12   "tumor on cyst on my kidney; had an ablation"  . EYE SURGERY    . INGUINAL HERNIA REPAIR Right   . IR GENERIC HISTORICAL  11/09/2015   IR RADIOLOGIST EVAL & MGMT 11/09/2015  [name], MD [facility] INTERV RAD  . KIDNEY SURGERY    . KNEE ARTHROSCOPY Right   . LUMBAR LAMINECTOMY/DECOMPRESSION MICRODISCECTOMY  05/14/2012   Procedure: LUMBAR LAMINECTOMY/DECOMPRESSION MICRODISCECTOMY 1 LEVEL;  Surgeon: [name], MD;  Location: [facility];  Service: Neurosurgery;  Laterality: Left;  Left Lumbar four-five Laminectomy/Diskectomy/Far lateral diskectomy  . SKIN CANCER EXCISION  "several"   "back of my neck; right eye; clavicle; nose"  . SUBDURAL HEMATOMA EVACUATION VIA CRANIOTOMY  2006  . THYROIDECTOMY     medullary carcinoma  . TONSILLECTOMY     Family History  Problem Relation Age of Onset  . Stroke Mother   . Stroke Father   . Heart disease Father   . Alzheimer's disease Brother   . Alzheimer's disease Sister    Social History  Substance Use Topics  . Smoking status: Former Smoker    Packs/day: 0.80    Years: 15.00    Types: Cigarettes    Quit date: 04/15/1965  . Smokeless tobacco: Never Used  . Alcohol use 6.0 oz/week    10 Glasses of wine per week     Comment: 1 glass of wine every night (6oz glass)  Review of Systems Per HPI    Objective:   Physical Exam  Constitutional: He is oriented to person, place, and time. He appears well-developed and well-nourished. No distress.  HENT:  Head: Normocephalic and atraumatic.  Cardiovascular: Normal rate.   Pulmonary/Chest: Effort normal.  Musculoskeletal: Normal range of motion.       Right hip: He exhibits normal range of motion, normal strength, no tenderness and no bony tenderness.       Lumbar back: He exhibits normal range of motion, no tenderness and no bony tenderness.  Neurological: He is alert and oriented to person, place, and time. He has normal reflexes.  Significant tremor right hand.   Skin: Skin is warm and dry. He is not diaphoretic.  Psychiatric: He has a normal mood and affect. His behavior is normal. Judgment and thought content normal.  Vitals reviewed.     BP  122/70 (BP Location: Left Arm, Patient Position: Sitting, Cuff Size: Normal)   Pulse 72   Temp 97.4 F (36.3 C) (Oral)   Wt 179 lb (81.2 kg)   BMI 21.79 kg/m  Wt Readings from Last 3 Encounters:  03/28/16 179 lb (81.2 kg)  03/05/16 181 lb (82.1 kg)  02/01/16 181 lb 12.8 oz (82.5 kg)   BP Readings from Last 3 Encounters:  03/28/16 122/70  03/05/16 (!) 120/58  02/01/16 110/62       Assessment & Plan:  1. Right hip pain - exam reassuring, no abnormal findings - can take acetaminophen, apply heat and massage - Follow up if worsening symptoms   2. Muscle spasm - has decreased in frequency and intensity and no episodes today - continue to monitor, expect resolution   Clarene Reamer, FNP-BC  [facility]  03/28/2016 1:32 PM

## 2016-03-28 NOTE — Patient Instructions (Signed)
For your spasm, you can take tylenol, apply heat, and massage.  Please let us know if you have more frequent episodes or they become more painful.

## 2016-04-29 ENCOUNTER — Other Ambulatory Visit: Payer: Self-pay | Admitting: Dermatology

## 2016-04-29 DIAGNOSIS — L570 Actinic keratosis: Secondary | ICD-10-CM | POA: Diagnosis not present

## 2016-04-29 DIAGNOSIS — C44311 Basal cell carcinoma of skin of nose: Secondary | ICD-10-CM | POA: Diagnosis not present

## 2016-05-14 ENCOUNTER — Other Ambulatory Visit: Payer: Self-pay | Admitting: Primary Care

## 2016-05-14 DIAGNOSIS — I10 Essential (primary) hypertension: Secondary | ICD-10-CM

## 2016-05-15 NOTE — Telephone Encounter (Signed)
Ok to refill? Electronically refill request for   hydrALAZINE (APRESOLINE) 25 MG tablet  Last prescribed on 06/15/2015. Last seen on 03/28/2016

## 2016-06-20 ENCOUNTER — Ambulatory Visit (INDEPENDENT_AMBULATORY_CARE_PROVIDER_SITE_OTHER): Payer: Medicare HMO

## 2016-06-20 ENCOUNTER — Telehealth: Payer: Self-pay

## 2016-06-20 ENCOUNTER — Other Ambulatory Visit: Payer: Commercial Managed Care - HMO

## 2016-06-20 VITALS — BP 120/76 | HR 49 | Temp 97.6°F | Ht 74.25 in | Wt 179.5 lb

## 2016-06-20 DIAGNOSIS — Z23 Encounter for immunization: Secondary | ICD-10-CM

## 2016-06-20 DIAGNOSIS — E784 Other hyperlipidemia: Secondary | ICD-10-CM | POA: Diagnosis not present

## 2016-06-20 DIAGNOSIS — R7989 Other specified abnormal findings of blood chemistry: Secondary | ICD-10-CM

## 2016-06-20 DIAGNOSIS — Z0000 Encounter for general adult medical examination without abnormal findings: Secondary | ICD-10-CM

## 2016-06-20 DIAGNOSIS — E7849 Other hyperlipidemia: Secondary | ICD-10-CM

## 2016-06-20 LAB — COMPREHENSIVE METABOLIC PANEL
ALBUMIN: 4.2 g/dL (ref 3.5–5.2)
ALK PHOS: 52 U/L (ref 39–117)
ALT: 4 U/L (ref 0–53)
AST: 13 U/L (ref 0–37)
BUN: 24 mg/dL — ABNORMAL HIGH (ref 6–23)
CALCIUM: 9 mg/dL (ref 8.4–10.5)
CO2: 35 mEq/L — ABNORMAL HIGH (ref 19–32)
Chloride: 101 mEq/L (ref 96–112)
Creatinine, Ser: 1.51 mg/dL — ABNORMAL HIGH (ref 0.40–1.50)
GFR: 46.79 mL/min — ABNORMAL LOW (ref >60.00)
Glucose, Bld: 121 mg/dL — ABNORMAL HIGH (ref 70–99)
POTASSIUM: 3.4 meq/L — AB (ref 3.5–5.1)
Sodium: 141 mEq/L (ref 135–145)
TOTAL PROTEIN: 6.7 g/dL (ref 6.0–8.3)
Total Bilirubin: 0.6 mg/dL (ref 0.2–1.2)

## 2016-06-20 LAB — LIPID PANEL
CHOLESTEROL: 139 mg/dL (ref 0–200)
HDL: 45.5 mg/dL (ref >39.00)
LDL CALC: 72 mg/dL (ref 0–99)
NonHDL: 93.34
Total CHOL/HDL Ratio: 3
Triglycerides: 107 mg/dL (ref 0.0–149.0)
VLDL: 21.4 mg/dL (ref 0.0–40.0)

## 2016-06-20 NOTE — Progress Notes (Signed)
Subjective:   William [name] is a 83 y.o. male who presents for Medicare Annual/Subsequent preventive examination.  Review of Systems:  N/A Cardiac Risk Factors include: advanced age (>3men, >69 women);male gender;hypertension;dyslipidemia     Objective:    Vitals: BP 120/76 (BP Location: Left Arm, Patient Position: Sitting, Cuff Size: Normal)   Pulse (!) 49   Temp 97.6 F (36.4 C) (Oral)   Ht 6' 2.25" (1.886 m) Comment: no shoes  Wt 179 lb 8 oz (81.4 kg)   SpO2 96%   BMI 22.89 kg/m   Body mass index is 22.89 kg/m.  Tobacco History  Smoking Status  . Former Smoker  . Packs/day: 0.80  . Years: 15.00  . Types: Cigarettes  . Quit date: 04/15/1965  Smokeless Tobacco  . Never Used     Counseling given: No   Past Medical History:  Diagnosis Date  . A-fib ([place])   . Arthritis    "right knee" (08/09/2014)  . Basal cell carcinoma   . Benign prostatic hypertrophy with urinary obstruction   . Complication of anesthesia    "difficulty intubation, had to use fiberoptic 2006"  . Difficult intubation   . DJD (degenerative joint disease)   . Gout   . Hx of colonic polyps   . Hyperlipidemia   . Hypertension    sees [name]  . Hypothyroidism   . Kidney tumor    "tumor on cyst on kidney"   . 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 Mini stroke ([place])   . Parkinson disease ([place])   . Pneumonia    hx of in 1952  . Primary skin squamous cell carcinoma   . Subdural hematoma ([place]) 2006  . TIA (transient ischemic attack)    pt does not recall this hx "but may have had one today" (08/09/2014)   Past Surgical History:  Procedure Laterality Date  . APPENDECTOMY    . BACK SURGERY    . CATARACT EXTRACTION W/ INTRAOCULAR LENS  IMPLANT, BILATERAL Bilateral ~ 1998  . CHOLECYSTECTOMY    . CRYOABLATION  07-10-12   "tumor on cyst on my kidney; had an ablation"  . EYE SURGERY    . INGUINAL HERNIA REPAIR Right   . IR GENERIC HISTORICAL   11/09/2015   IR RADIOLOGIST EVAL & MGMT 11/09/2015 [name], MD [facility] INTERV RAD  . KIDNEY SURGERY    . KNEE ARTHROSCOPY Right   . LUMBAR LAMINECTOMY/DECOMPRESSION MICRODISCECTOMY  05/14/2012   Procedure: LUMBAR LAMINECTOMY/DECOMPRESSION MICRODISCECTOMY 1 LEVEL;  Surgeon: [name], MD;  Location: [facility];  Service: Neurosurgery;  Laterality: Left;  Left Lumbar four-five Laminectomy/Diskectomy/Far lateral diskectomy  . SKIN CANCER EXCISION  "several"   "back of my neck; right eye; clavicle; nose"  . SUBDURAL HEMATOMA EVACUATION VIA CRANIOTOMY  2006  . THYROIDECTOMY     medullary carcinoma  . TONSILLECTOMY     Family History  Problem Relation Age of Onset  . Stroke Mother   . Stroke Father   . Heart disease Father   . Alzheimer's disease Brother   . Alzheimer's disease Sister    History  Sexual Activity  . Sexual activity: Yes    Outpatient Encounter Prescriptions as of 06/20/2016  Medication Sig  . apixaban (ELIQUIS) 2.5 MG TABS tablet Take 1 tablet (2.5 mg total) by mouth 2 (two) times daily. (Patient taking differently: Take 5 mg by mouth 2 (two) times daily. )  . carbidopa-levodopa (SINEMET  IR) 25-100 MG tablet Take 1.5 tablets by mouth 4 (four) times daily. At 8, 12, 4 PM, and 8 PM  . hydrALAZINE (APRESOLINE) 25 MG tablet TAKE 1 TABLET THREE TIMES DAILY  . levothyroxine (SYNTHROID, LEVOTHROID) 100 MCG tablet TAKE 1 TABLET EVERY DAY BEFORE BREAKFAST  . losartan-hydrochlorothiazide (HYZAAR) 100-12.5 MG tablet Take 1 tablet by mouth daily.  . Multiple Vitamin (MULTIVITAMIN WITH MINERALS) TABS Take 1 tablet by mouth daily. Reported on 08/01/2015  . Probiotic Product (ALIGN) 4 MG CAPS Take 1 capsule by mouth daily.  . rasagiline (AZILECT) 1 MG TABS tablet Take 1 tablet (1 mg total) by mouth daily.  . simvastatin (ZOCOR) 20 MG tablet Take 1 tablet (20 mg total) by mouth at bedtime.   No facility-administered encounter medications on file as of 06/20/2016.      Activities of Daily Living In your present state of health, do you have any difficulty performing the following activities: 06/20/2016  Hearing? N  Vision? N  Difficulty concentrating or making decisions? N  Walking or climbing stairs? N  Dressing or bathing? N  Doing errands, shopping? N  Preparing Food and eating ? N  Using the Toilet? N  In the past six months, have you accidently leaked urine? N  Do you have problems with loss of bowel control? N  Managing your Medications? N  Managing your Finances? N  Housekeeping or managing your Housekeeping? N  Some recent data might be hidden    Patient Care Team: Pleas Koch, NP as PCP - General (Nurse Practitioner)   Assessment:     Hearing Screening   125Hz  250Hz  500Hz  1000Hz  2000Hz  3000Hz  4000Hz  6000Hz  8000Hz   Right ear:   0 0 0  0    Left ear:   0 0 0  0    Vision Screening Comments: Last vision exam in 2017 with Dr. Gershon Crane   Exercise Activities and Dietary recommendations Current Exercise Habits: Home exercise routine, Type of exercise: stretching;Other - see comments (cardio, balance), Time (Minutes): 60, Frequency (Times/Week): 3, Weekly Exercise (Minutes/Week): 180, Intensity: Moderate, Exercise limited by: None identified  Goals    . Increase physical activity          Starting 06/20/16, I will continue to do [facility] for at least 60 min 3 days per week.       Fall Risk Fall Risk  06/20/2016 03/05/2016 10/26/2015 06/22/2015 06/15/2015  Falls in the past year? No Yes No Yes Yes  Number falls in past yr: - 1 - 1 1  Injury with Fall? - No - No No  Risk for fall due to : - - - Impaired balance/gait -  Follow up - - - Falls prevention discussed -   Depression Screen PHQ 2/9 Scores 06/20/2016 06/15/2015 12/21/2013 12/10/2012  PHQ - 2 Score 0 0 0 0    Cognitive Function MMSE - Mini Mental State Exam 06/20/2016  Orientation to time 5  Orientation to Place 5  Registration 3  Attention/ Calculation 0  Recall 2   Recall-comments pt was unable to recall 1 of 3 words  Language- name 2 objects 0  Language- repeat 1  Language- follow 3 step command 3  Language- read & follow direction 0  Write a sentence 0  Copy design 0  Total score 19     PLEASE NOTE: A Mini-Cog screen was completed. Maximum score is 20. A value of 0 denotes this part of [place] MMSE was not completed or the  patient failed this part of the Mini-Cog screening.   Mini-Cog Screening Orientation to Time - Max 5 pts Orientation to Place - Max 5 pts Registration - Max 3 pts Recall - Max 3 pts Language Repeat - Max 1 pts Language Follow 3 Step Command - Max 3 pts     Immunization History  Administered Date(s) Administered  . Influenza Split 02/04/2011, 01/28/2012  . Influenza Whole 02/05/2006, 01/06/2008, 01/27/2009, 01/31/2010  . Influenza,inj,Quad PF,36+ Mos 02/23/2013, 12/21/2013, 01/24/2015, 12/26/2015  . Pneumococcal Conjugate-13 06/20/2016  . Pneumococcal Polysaccharide-23 04/15/1996, 05/09/2009  . Td 04/15/1998, 11/30/2009  . Zoster 12/14/2013   Screening Tests Health Maintenance  Topic Date Due  . TETANUS/TDAP  12/01/2019  . INFLUENZA VACCINE  Completed  . PNA vac Low Risk Adult  Completed      Plan:     I have personally reviewed and addressed the Medicare Annual Wellness questionnaire and have noted the following in the patient's chart:  A. Medical and social history B. Use of alcohol, tobacco or illicit drugs  C. Current medications and supplements D. Functional ability and status E.  Nutritional status F.  Physical activity G. Advance directives H. List of other physicians I.  Hospitalizations, surgeries, and ER visits in previous 12 months J.  [place] to include hearing, vision, cognitive, depression L. Referrals and appointments - none  In addition, I have reviewed and discussed with patient certain preventive protocols, quality metrics, and best practice recommendations. A written  personalized care plan for preventive services as well as general preventive health recommendations were provided to patient.  See attached scanned questionnaire for additional information.   Signed,   Lindell Noe, MHA, BS, LPN Health Coach

## 2016-06-20 NOTE — Progress Notes (Signed)
Pre visit review using our clinic review tool, if applicable. No additional management support is needed unless otherwise documented below in the visit note. 

## 2016-06-20 NOTE — Patient Instructions (Signed)
William [name] , Thank you for taking time to come for your Medicare Wellness Visit. I appreciate your ongoing commitment to your health goals. Please review the following plan we discussed and let me know if I can assist you in the future.   These are the goals we discussed: Goals    . Increase physical activity          Starting 06/20/16, I will continue to do [facility] for at least 60 min 3 days per week.        This is a list of the screening recommended for you and due dates:  Health Maintenance  Topic Date Due  . Tetanus Vaccine  12/01/2019  . Flu Shot  Completed  . Pneumonia vaccines  Completed   Preventive Care for Adults  A healthy lifestyle and preventive care can promote health and wellness. Preventive health guidelines for adults include the following key practices.  . A routine yearly physical is a good way to check with your health care provider about your health and preventive screening. It is a chance to share any concerns and updates on your health and to receive a thorough exam.  . Visit your dentist for a routine exam and preventive care every 6 months. Brush your teeth twice a day and floss once a day. Good oral hygiene prevents tooth decay and gum disease.  . The frequency of eye exams is based on your age, health, family medical history, use  of contact lenses, and other factors. Follow your health care provider's ecommendations for frequency of eye exams.  . Eat a healthy diet. Foods like vegetables, fruits, whole grains, low-fat dairy products, and lean protein foods contain the nutrients you need without too many calories. Decrease your intake of foods high in solid fats, added sugars, and salt. Eat the right amount of calories for you. Get information about a proper diet from your health care provider, if necessary.  . Regular physical exercise is one of the most important things you can do for your health. Most adults should get at least 150 minutes  of moderate-intensity exercise (any activity that increases your heart rate and causes you to sweat) each week. In addition, most adults need muscle-strengthening exercises on 2 or more days a week.  Silver Sneakers may be a benefit available to you. To determine eligibility, you may visit the website: www.silversneakers.com or contact program at 307-016-7805 Mon-Fri between 8AM-8PM.   . Maintain a healthy weight. The body mass index (BMI) is a screening tool to identify possible weight problems. It provides an estimate of body fat based on height and weight. Your health care provider can find your BMI and can help you achieve or maintain a healthy weight.   For adults 20 years and older: ? A BMI below 18.5 is considered underweight. ? A BMI of 18.5 to 24.9 is normal. ? A BMI of 25 to 29.9 is considered overweight. ? A BMI of 30 and above is considered obese.   . Maintain normal blood lipids and cholesterol levels by exercising and minimizing your intake of saturated fat. Eat a balanced diet with plenty of fruit and vegetables. Blood tests for lipids and cholesterol should begin at age 6 and be repeated every 5 years. If your lipid or cholesterol levels are high, you are over 50, or you are at high risk for heart disease, you may need your cholesterol levels checked more frequently. Ongoing high lipid and cholesterol levels should be treated  with medicines if diet and exercise are not working.  . If you smoke, find out from your health care provider how to quit. If you do not use tobacco, please do not start.  . If you choose to drink alcohol, please do not consume more than 2 drinks per day. One drink is considered to be 12 ounces (355 mL) of beer, 5 ounces (148 mL) of wine, or 1.5 ounces (44 mL) of liquor.  . If you are 1-61 years old, ask your health care provider if you should take aspirin to prevent strokes.  . Use sunscreen. Apply sunscreen liberally and repeatedly throughout the day.  You should seek shade when your shadow is shorter than you. Protect yourself by wearing long sleeves, pants, a wide-brimmed hat, and sunglasses year round, whenever you are outdoors.  . Once a month, do a whole body skin exam, using a mirror to look at the skin on your back. Tell your health care provider of new moles, moles that have irregular borders, moles that are larger than a pencil eraser, or moles that have changed in shape or color.

## 2016-06-20 NOTE — Progress Notes (Signed)
PCP notes:   Health maintenance:  PCV13 - administered  Abnormal screenings:   Hearing - failed Mini-Cog score: 19/20  Patient concerns:   None  Nurse concerns:  Patient is not taking Apixaban as prescribed. PCP and Cardiologist notified.  Next PCP appt:   06/25/16 @ 1100

## 2016-06-20 NOTE — Telephone Encounter (Signed)
Dr. Percival Spanish,  Patient was in today for his Annual Wellness Visit. During medication review, it was determined patient has been taking Apixaban 5 mg tablet instead of 2.5 mg tablet twice daily. His significant other, Ms. [name], reported during conversation that sometimes patient bleeds "a lot". PCP was notified. Outreaching to you so that you are aware. I have encouraged patient to take the correct dosage as prescribed. However, I did tell the patient that you would make the final decision about this medication.   Katha Cabal

## 2016-06-20 NOTE — Telephone Encounter (Signed)
Noted  

## 2016-06-21 ENCOUNTER — Telehealth: Payer: Self-pay | Admitting: Cardiology

## 2016-06-21 NOTE — Telephone Encounter (Signed)
Patient returned call. Notified him of advice per Erasmo Downer and he voiced understanding. Advised that he call our office when he needs a refill so we can Rx for 2.5mg  tablets of eliquis.

## 2016-06-21 NOTE — Telephone Encounter (Signed)
New Message  Pt c/o medication issue:  1. Name of Medication: eliquis 2.5 mg twice daily  2. How are you currently taking this medication (dosage and times per day)? See above  3. Are you having a reaction (difficulty breathing--STAT)? N/A  4. What is your medication issue? Pt voiced needing clarity on daily dosage.

## 2016-06-21 NOTE — Telephone Encounter (Signed)
Returned call to patient. Patient states he gets his Rx from the [facility]. He states he has been taking 5mg  of Eliquis for 2 years. He states he "just discovered it yesterday". He last saw Dr. Percival Spanish 11/2015 and our records indicate he should take 2.5mg  twice daily.  Patient is going out of [place] weeks on March 23  Will route to pharmacy & MD to review meds for advice on appropriate dose.

## 2016-06-21 NOTE — Telephone Encounter (Signed)
His Serum Creatinine is right on the boarder of him needing 5 or 2.5 mg dose.  It is not a problem that he has been on 5 mg, but would suggest that he cut his tablets in half, go to 2.5 mg bid.  We can send a new prescription in for the lower strength tablets once he runs out of the 5 mg tabs

## 2016-06-21 NOTE — Telephone Encounter (Signed)
LMTCB

## 2016-06-23 NOTE — Progress Notes (Signed)
I reviewed health advisor's note, was available for consultation, and agree with documentation and plan.  

## 2016-06-24 DIAGNOSIS — L905 Scar conditions and fibrosis of skin: Secondary | ICD-10-CM | POA: Diagnosis not present

## 2016-06-24 DIAGNOSIS — L570 Actinic keratosis: Secondary | ICD-10-CM | POA: Diagnosis not present

## 2016-06-24 DIAGNOSIS — Z85828 Personal history of other malignant neoplasm of skin: Secondary | ICD-10-CM | POA: Diagnosis not present

## 2016-06-25 ENCOUNTER — Ambulatory Visit (INDEPENDENT_AMBULATORY_CARE_PROVIDER_SITE_OTHER): Payer: Medicare HMO | Admitting: Primary Care

## 2016-06-25 ENCOUNTER — Encounter: Payer: Self-pay | Admitting: Primary Care

## 2016-06-25 VITALS — BP 112/66 | HR 61 | Temp 97.5°F | Ht 74.0 in | Wt 183.8 lb

## 2016-06-25 DIAGNOSIS — E890 Postprocedural hypothyroidism: Secondary | ICD-10-CM | POA: Diagnosis not present

## 2016-06-25 DIAGNOSIS — I10 Essential (primary) hypertension: Secondary | ICD-10-CM

## 2016-06-25 DIAGNOSIS — I482 Chronic atrial fibrillation, unspecified: Secondary | ICD-10-CM

## 2016-06-25 DIAGNOSIS — N182 Chronic kidney disease, stage 2 (mild): Secondary | ICD-10-CM

## 2016-06-25 DIAGNOSIS — E785 Hyperlipidemia, unspecified: Secondary | ICD-10-CM | POA: Diagnosis not present

## 2016-06-25 DIAGNOSIS — G20 Parkinson's disease: Secondary | ICD-10-CM

## 2016-06-25 MED ORDER — APIXABAN 2.5 MG PO TABS: 2.5000 mg | ORAL_TABLET | Freq: Two times a day (BID) | ORAL | 3 refills | Status: DC

## 2016-06-25 NOTE — Assessment & Plan Note (Signed)
Doing well with rock-step-boxing therapy. Continue current regimen. Following with neurology.

## 2016-06-25 NOTE — Assessment & Plan Note (Signed)
Irregular today, normal rate. Discussed to reduce Eliquis back down to 2.5 mg BID given age and renal function. Rx for the 2.5 mg dose sent to pharmacy.

## 2016-06-25 NOTE — Progress Notes (Signed)
Subjective:    Patient ID: William [name], male    DOB: [birth year], 83 y.o.   MRN: [id number]  HPI  [name] is an 83 year old male who presents today [place]. He saw our health advisor last week.  1) Essential Hypertension: Currently managed on hydralazine 25 mg three times daily, losartan-HCTZ 100/12.5 mg. He denies chest pain, dizziness, shortness of breath.  2) [name]: Currently managed on carbidopa-levodopa 25-100 mg and rasagiline 1 mg. He is following with neurology and is due for an appointment next week. He's continued to participate in Rock-Step-Boxing which has helped with motor skills. Overall feeling well managed and doing well.  3) Atrial Fibrillation: Currently managed on apixaban 2.5 mg BID. During his visit with our health advisor he admitted to taking 5 mg BID as he was last sent refills for the 5 mg tablet of apixiban through the [place] [facility]. Cardiology was contacted and the pharmacist recommended he reduce back down to 2.5 mg BID given age and renal function. He's having a difficult time cutting his 5 mg tablets in half as they are not scored and would like the 2.5 mg tablets sent to his pharmacy.  4) Hyperlipidemia: Currently managed on Simvastatin 20 mg. He denies myalgias. Lipid panel recently completed and is stable.   5) Hypothyroidism: Currently managed on levothyroxine 100 mcg. Last TSH was stable in October 2017.   Review of Systems  Constitutional: Negative for unexpected weight change.  Eyes: Negative for visual disturbance.  Respiratory: Negative for shortness of breath.   Cardiovascular: Negative for chest pain and palpitations.  Neurological: Negative for dizziness and headaches.       Tremors overall stable       Past Medical History:  Diagnosis Date  . A-fib ([place])   . Arthritis    "right knee" (08/09/2014)  . Basal cell carcinoma   . Benign prostatic hypertrophy with urinary obstruction   . Complication of anesthesia    "difficulty  intubation, had to use fiberoptic 2006"  . Difficult intubation   . DJD (degenerative joint disease)   . Gout   . Hx of colonic polyps   . Hyperlipidemia   . Hypertension    sees [name]  . Hypothyroidism   . Kidney tumor    "tumor on cyst on kidney"   . 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 Mini stroke ([place])   . Parkinson disease ([place])   . Pneumonia    hx of in 1952  . Primary skin squamous cell carcinoma   . Subdural hematoma ([place]) 2006  . TIA (transient ischemic attack)    pt does not recall this hx "but may have had one today" (08/09/2014)     Social History   Social History  . Marital status: Widowed    Spouse name: N/A  . Number of children: 3  . Years of education: 15   Occupational History  . retired    Social History Main Topics  . Smoking status: Former Smoker    Packs/day: 0.80    Years: 15.00    Types: Cigarettes    Quit date: 04/15/1965  . Smokeless tobacco: Never Used  . Alcohol use 6.0 oz/week    10 Glasses of wine per week     Comment: 1 glass of wine every night (6oz glass)  . Drug use: No  . Sexual activity: Yes   Other Topics Concern  . Not on  file   Social History Narrative   Married.   Lives at home with significant other.   Retried. 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   Enjoys golfing, yard work, spending time with family.         Past Surgical History:  Procedure Laterality Date  . APPENDECTOMY    . BACK SURGERY    . CATARACT EXTRACTION W/ INTRAOCULAR LENS  IMPLANT, BILATERAL Bilateral ~ 1998  . CHOLECYSTECTOMY    . CRYOABLATION  07-10-12   "tumor on cyst on my kidney; had an ablation"  . EYE SURGERY    . INGUINAL HERNIA REPAIR Right   . IR GENERIC HISTORICAL  11/09/2015   IR RADIOLOGIST EVAL & MGMT 11/09/2015 [name], MD [facility] INTERV RAD  . KIDNEY SURGERY    . KNEE ARTHROSCOPY Right   . LUMBAR LAMINECTOMY/DECOMPRESSION MICRODISCECTOMY  05/14/2012   Procedure: LUMBAR  LAMINECTOMY/DECOMPRESSION MICRODISCECTOMY 1 LEVEL;  Surgeon: [name], MD;  Location: [facility];  Service: Neurosurgery;  Laterality: Left;  Left Lumbar four-five Laminectomy/Diskectomy/Far lateral diskectomy  . SKIN CANCER EXCISION  "several"   "back of my neck; right eye; clavicle; nose"  . SUBDURAL HEMATOMA EVACUATION VIA CRANIOTOMY  2006  . THYROIDECTOMY     medullary carcinoma  . TONSILLECTOMY      Family History  Problem Relation Age of Onset  . Stroke Mother   . Stroke Father   . Heart disease Father   . Alzheimer's disease Brother   . Alzheimer's disease Sister     No Known Allergies  Current Outpatient Prescriptions on File Prior to Visit  Medication Sig Dispense Refill  . carbidopa-levodopa (SINEMET IR) 25-100 MG tablet Take 1.5 tablets by mouth 4 (four) times daily. At 8, 12, 4 PM, and 8 PM 360 tablet 3  . hydrALAZINE (APRESOLINE) 25 MG tablet TAKE 1 TABLET THREE TIMES DAILY 270 tablet 3  . levothyroxine (SYNTHROID, LEVOTHROID) 100 MCG tablet TAKE 1 TABLET EVERY DAY BEFORE BREAKFAST 30 tablet 5  . losartan-hydrochlorothiazide (HYZAAR) 100-12.5 MG tablet Take 1 tablet by mouth daily. 90 tablet 3  . Multiple Vitamin (MULTIVITAMIN WITH MINERALS) TABS Take 1 tablet by mouth daily. Reported on 08/01/2015    . Probiotic Product (ALIGN) 4 MG CAPS Take 1 capsule by mouth daily.    . rasagiline (AZILECT) 1 MG TABS tablet Take 1 tablet (1 mg total) by mouth daily. 90 tablet 3  . simvastatin (ZOCOR) 20 MG tablet Take 1 tablet (20 mg total) by mouth at bedtime. 90 tablet 3  . [DISCONTINUED] losartan (COZAAR) 50 MG tablet Take 1 tablet (50 mg total) by mouth daily. 30 tablet 1   No current facility-administered medications on file prior to visit.     BP 112/66   Pulse 61   Temp 97.5 F (36.4 C) (Oral)   Ht 6\' 2"  (1.88 m)   Wt 183 lb 12.8 oz (83.4 kg)   SpO2 97%   BMI 23.60 kg/m    Objective:   Physical Exam  Constitutional: He is oriented to person, place, and  time. He appears well-nourished.  HENT:  Right Ear: Tympanic membrane and ear canal normal.  Left Ear: Tympanic membrane and ear canal normal.  Nose: Nose normal. Right sinus exhibits no maxillary sinus tenderness and no frontal sinus tenderness. Left sinus exhibits no maxillary sinus tenderness and no frontal sinus tenderness.  Mouth/Throat: Oropharynx is clear and moist.  Mild cerumen impaction to left canal, this was easily removed with instrumentation. Canal  and TM's unremarkable.  Eyes: Conjunctivae and EOM are normal. Pupils are equal, round, and reactive to light.  Neck: Neck supple. Carotid bruit is not present. No thyromegaly present.  Cardiovascular: Normal rate.   Irregularly irregular.  Pulmonary/Chest: Effort normal and breath sounds normal. He has no wheezes. He has no rales.  Abdominal: Soft. Bowel sounds are normal. There is no tenderness.  Musculoskeletal: Normal range of motion.  Neurological: He is alert and oriented to person, place, and time. He has normal reflexes. No cranial nerve deficit.  Skin: Skin is warm and dry.  Psychiatric: He has a normal mood and affect.          Assessment & Plan:

## 2016-06-25 NOTE — Assessment & Plan Note (Signed)
Stable today, continue current regimen. 

## 2016-06-25 NOTE — Assessment & Plan Note (Signed)
Renal function overall stable. Continue to renally dose meds, avoid nephrotoxic agents.

## 2016-06-25 NOTE — Progress Notes (Signed)
Pre visit review using our clinic review tool, if applicable. No additional management support is needed unless otherwise documented below in the visit note. 

## 2016-06-25 NOTE — Patient Instructions (Signed)
We will get the newer dose of apixiban sent to the [facility] in [place].    Start taking 2.5 mg twice daily for atrial fibrillation.  Continue Rock-Step-Boxing. Continue regular exercise.  Your labs look great overall.   Follow up in 1 year for your annual exam or sooner if needed. Have a great time in [place]!  It was a pleasure to see you today!

## 2016-06-25 NOTE — Assessment & Plan Note (Signed)
Recent lipid panel stable, continue Zocor.

## 2016-06-25 NOTE — Assessment & Plan Note (Signed)
TSH in October stable. Continue levothyroxine 100 mcg.

## 2016-06-27 ENCOUNTER — Encounter: Payer: Self-pay | Admitting: Neurology

## 2016-06-27 ENCOUNTER — Ambulatory Visit (INDEPENDENT_AMBULATORY_CARE_PROVIDER_SITE_OTHER): Payer: Commercial Managed Care - HMO | Admitting: Neurology

## 2016-06-27 VITALS — BP 126/52 | HR 60 | Resp 14 | Ht 74.0 in | Wt 183.0 lb

## 2016-06-27 DIAGNOSIS — G20 Parkinson's disease: Secondary | ICD-10-CM

## 2016-06-27 NOTE — Progress Notes (Signed)
Subjective:    Patient ID: William [name] is a 83 y.o. male.  HPI     Interim history:   William [name] is an 83 year old right-handed gentleman with an underlying medical history of right TIA in January 2003, SDH (s/p left craniotomy in August 2006), hypertension, hypothyroidism, thyroid cancer, partial onset seizures (off Dilantin), lumbar spine disease, status post lower back surgery at L4-5 in January 2014, renal tumor, status post kidney tumor ablative surgery in March 2014, who presents for followup consultation of his right-sided predominant [name]. He is accompanied by William [name] (longterm GF) again today. I last saw him on 03/05/2016, at which time he reported doing okay. He was during his stay active, was participating in [place] steady boxing, which he felt was helpful. He was avoiding strenuous parts of the exercise. He was interested in participating in a new drug study at [facility]. This was to investigate apomorphine sublingual to decrease off time. He was on Azilect once daily. He was taking Sinemet 4 times a day. He was trying to hydrate well, reported no recent falls. I suggested he increase the Sinemet to 1-1/2 pills 4 times a day. I suggested he continue with generic Azilect once daily.  Today, 06/27/2016 (all dictated new, as well as above notes, some dictation done in note pad or Word, outside of chart, may appear as copied):  He reports doing well. Excited about his upcoming trip to [place] and [place] later this month for 2 weeks. He is going to check at home if he needs any bridge prescription for his Sinemet. He feels it every 3 months at the [place]. I would be happy to provide a shorter prescription at a retail pharmacy few needs to. No recent falls, recently realized that he had been taking Eliquis 5 mg bid through [place], rather than 2.5 mg twice daily as originally prescribed by his cardiologist. He has reduce the dose to 2.5 mg twice daily. Other than that he had no  recent medication changes and feels that the increase in Sinemet last time has been helpful. He continues with the boxing classes and feels they are helpful.  Previously (copied from previous notes for reference):   I saw him on 10/26/2015, at which time he reported doing quite well, had some stumbling episodes, no actual falls, was trying to drink enough water and was still physically active. He had signed up for [place] steady boxing classes but William [name] noted that he would get too exhausted.   I saw him on 06/22/2015, at which time he reported doing fairly well. He had finished outpatient OT, PT, and ST. Unfortunately, he did take a fall in the garage couple of months prior as he was coming in from the graduate, stepped backwards on the stairs and slipped off falling backwards, landed on his behind, head struck the car, he denied loss of consciousness or headache or bruise and no sequelae were reported. He was having rails installed in the garage entrance. Was still trying to stay active, playing golf. We mutually agreed to continue with Sinemet 4 times a day and Azilect once daily. We talked about fall risk and gait safety and fall precautions.   I saw him on 02/23/2015, at which time he reported doing fairly well. He was playing golf regularly. His right knee was bothering him. He had no recent falls. He was trying to hydrate well enough. He had no new issues with A. fib and no new complaints otherwise. I noted right knee  swelling. He was advised to follow-up with his primary care physician for this. We mutually agreed to keep his Parkinson's medications the same.    I saw him on 11/01/2014 at which time he reported a recent diagnosis with A. fib. He was admitted to the hospital in April. I reviewed the hospital records from 07/17/2014 through 07/18/2014. He was started on Xarelto. He was then re-admitted on 08/09/14 to 08/10/14 due to altered mental status and slurring of speech and was suspected to have a  TIA. He was seen by neuro in consultation and a baby aspirin was added to Xarelto. He presented back to the ER on 08/18/14. He a [place] wo contrast on  08/18/2014 , which was negative for any acute changes. He was seen by the Neurologist in the emergency room and it was felt that a full TIA workup was not necessary at the time. He had presented with hypertension and some slurring of speech. He was switched from Xarelto to Eliquis, because the Xarelto is not on formulary at the [place].  He endorsed recent stressors, including the recent passing of his younger brother a week prior with advanced Alzheimer's disease.    He had an MRI brain and MRA head on 08/09/14: MRI HEAD: No acute intracranial process, specifically no acute ischemia. Involutional changes. Mild white matter changes suggest chronic small vessel ischemic disease. MRA HEAD: No acute large vessel occlusion or high-grade stenosis. Mid grade stenosis RIGHT P2/3 segment.    I suggested we continue with Sinemet at 4 times a day. He was advised to continue with Azilect as well. He was advised to drink more water. In the interim, we restarted outpatient physical therapy, occupational therapy and speech therapy.   I saw him on 04/29/2014, at which time he reported doing well overall but he was more fatigued and had some excessive daytime somnolence. Memory was stable. His mood was stable. He noted some blood pressure fluctuations. Sometimes he had a dull headache and sometimes he felt lightheaded. He had some anxiety over his lady friend's health, as she had fallen and broken rib. He continued to walk regularly and played golf. He had no recent falls. I asked him to continue with his medications, Azilect once daily and Sinemet 4 times a day.   I saw him on 10/28/2013, at which time he reported doing well. In particular, had no cognitive complaints, no mood issues, and continued to play golf 3 times a week. He was driving well. I suggested an increase in his  levodopa to 1 pill 4 times a day of the 25-100 milligrams strength. [facility] and had seen a [place] neurologist as well.      I saw him on 12/07/2012, at which time I felt that he was doing well on Azilect and levodopa. He called in December 2014 with problems with blood pressure fluctuations. He was wondering if this came from the [facility] but I did not think it was due to the Azilect per se. I was reluctant to take him off of it.    I first met him on 06/03/2012 and he previously followed by Dr. Morene Antu. He has had primarily right-sided symptoms with regards to his Parkinson's, diagnosed in 2010 with symptoms dating back to late 2009 or early 2010. He had briefly tried Mirapex but was taken off d/t hypotension. L spine MRI in June 2013 showed renal cysts, degenerative joint disease most prominent at L4-5. He tried acupuncture.  His MRI of the lumbar spine showed abnormalities with his kidney with a cyst and a tumor and he had ablative surgery on 07/10/12 and had a FU CT done. He had lower back surgery on 05/14/12.  I saw him back on 09/03/2012 and I suggested starting Azilect. He has been tolerating it well and both he and his girlfriend felt that he did better with it in terms of dexterity and fine motor control. He had some hypotension and lightheadedness and reduced his BP medication. He has been having some issue with gout.     His Past Medical History Is Significant For: Past Medical History:  Diagnosis Date  . A-fib ([place])   . Arthritis    "right knee" (08/09/2014)  . Basal cell carcinoma   . Benign prostatic hypertrophy with urinary obstruction   . Complication of anesthesia    "difficulty intubation, had to use fiberoptic 2006"  . Difficult intubation   . DJD (degenerative joint disease)   . Gout   . Hx of colonic polyps   . Hyperlipidemia   . Hypertension    sees [name]  . Hypothyroidism   . Kidney tumor    "tumor on cyst on kidney"   .  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 Mini stroke ([place])   . Parkinson disease ([place])   . Pneumonia    hx of in 1952  . Primary skin squamous cell carcinoma   . Subdural hematoma ([place]) 2006  . TIA (transient ischemic attack)    pt does not recall this hx "but may have had one today" (08/09/2014)    His Past Surgical History Is Significant For: Past Surgical History:  Procedure Laterality Date  . APPENDECTOMY    . BACK SURGERY    . CATARACT EXTRACTION W/ INTRAOCULAR LENS  IMPLANT, BILATERAL Bilateral ~ 1998  . CHOLECYSTECTOMY    . CRYOABLATION  07-10-12   "tumor on cyst on my kidney; had an ablation"  . EYE SURGERY    . INGUINAL HERNIA REPAIR Right   . IR GENERIC HISTORICAL  11/09/2015   IR RADIOLOGIST EVAL & MGMT 11/09/2015 [name], MD [facility] INTERV RAD  . KIDNEY SURGERY    . KNEE ARTHROSCOPY Right   . LUMBAR LAMINECTOMY/DECOMPRESSION MICRODISCECTOMY  05/14/2012   Procedure: LUMBAR LAMINECTOMY/DECOMPRESSION MICRODISCECTOMY 1 LEVEL;  Surgeon: [name], MD;  Location: [place] [facility];  Service: Neurosurgery;  Laterality: Left;  Left Lumbar four-five Laminectomy/Diskectomy/Far lateral diskectomy  . SKIN CANCER EXCISION  "several"   "back of my neck; right eye; clavicle; nose"  . SUBDURAL HEMATOMA EVACUATION VIA CRANIOTOMY  2006  . THYROIDECTOMY     medullary carcinoma  . TONSILLECTOMY      His Family History Is Significant For: Family History  Problem Relation Age of Onset  . Stroke Mother   . Stroke Father   . Heart disease Father   . Alzheimer's disease Brother   . Alzheimer's disease Sister     His Social History Is Significant For: Social History   Social History  . Marital status: Widowed    Spouse name: N/A  . Number of children: 3  . Years of education: 15   Occupational History  . retired    Social History Main Topics  . Smoking status: Former Smoker    Packs/day: 0.80    Years: 15.00    Types: Cigarettes    Quit  date: 04/15/1965  . Smokeless tobacco: Never Used  .  Alcohol use 6.0 oz/week    10 Glasses of wine per week     Comment: 1 glass of wine every night (6oz glass)  . Drug use: No  . Sexual activity: Yes   Other Topics Concern  . Not on file   Social History Narrative   Married.   Lives at home with significant other.   Retried. 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   Enjoys golfing, yard work, spending time with family.         His Allergies Are:  No Known Allergies:   His Current Medications Are:  Outpatient Encounter Prescriptions as of 06/27/2016  Medication Sig  . apixaban (ELIQUIS) 2.5 MG TABS tablet Take 1 tablet (2.5 mg total) by mouth 2 (two) times daily.  . carbidopa-levodopa (SINEMET IR) 25-100 MG tablet Take 1.5 tablets by mouth 4 (four) times daily. At 8, 12, 4 PM, and 8 PM  . hydrALAZINE (APRESOLINE) 25 MG tablet TAKE 1 TABLET THREE TIMES DAILY  . levothyroxine (SYNTHROID, LEVOTHROID) 100 MCG tablet TAKE 1 TABLET EVERY DAY BEFORE BREAKFAST  . losartan-hydrochlorothiazide (HYZAAR) 100-12.5 MG tablet Take 1 tablet by mouth daily.  . Multiple Vitamin (MULTIVITAMIN WITH MINERALS) TABS Take 1 tablet by mouth daily. Reported on 08/01/2015  . Probiotic Product (ALIGN) 4 MG CAPS Take 1 capsule by mouth daily.  . rasagiline (AZILECT) 1 MG TABS tablet Take 1 tablet (1 mg total) by mouth daily.  . simvastatin (ZOCOR) 20 MG tablet Take 1 tablet (20 mg total) by mouth at bedtime.   No facility-administered encounter medications on file as of 06/27/2016.   :  Review of Systems:  Out of a complete 14 point review of systems, all are reviewed and negative with the exception of these symptoms as listed below:  Review of Systems  Neurological:       Patient states that the increase of Sinemet has helped him. He continues to remain active. No new concerns.     Objective:  Neurologic Exam  Physical Exam Physical Examination:   Vitals:   06/27/16 1051  BP: (!) 126/52  Pulse: 60   Resp: 14   General Examination: The patient is a very pleasant 83 y.o. male in no acute distress. He appears well-developed and well-nourished and well groomed. He is in good spirits today.  HEENT: Normocephalic, atraumatic, pupils are equal, round and reactive to light and accommodation. Extraocular tracking shows mild saccadic breakdown. Speech is moderately hypophonic and mildly dysarthric. Hearing is grossly intact. Oropharynx exam reveals moderate mouth dryness, tongue protrudes centrally and palate elevates symmetrically. No other abnormal findings, he has moderate nuchal rigidity and decrease in passive range of motion.  Chest: Clear to auscultation without wheezing, rhonchi or crackles noted.  Heart: S1+S2+0, regular and normal without murmurs, rubs or gallops noted.   Abdomen: Soft, non-tender and non-distended with normal bowel sounds appreciated on auscultation.  Extremities: There is no pitting edema in the distal lower extremities bilaterally. Pedal pulses are intact.  Skin: Warm and dry without trophic changes noted. Mild bruising on hands.  Musculoskeletal: exam reveals no obvious joint deformities, tenderness or joint swelling or erythema.   Neurologically:  Mental status: The patient is awake, alert and oriented in all 4 spheres. His immediate and remote memory, attention, language skills and fund of knowledge are appropriate. There is no evidence of aphasia, agnosia, apraxia or anomia. Speech is clear with normal prosody and enunciation. Thought process is linear. Mood is normal and affect is normal.  Cranial nerves II - XII are as described above under HEENT exam. In addition: shoulder shrug is normal with equal shoulder height noted. Motor exam: Normal bulk, and strength for age. He has increased tone in the right more than left upper and lower extremities. He has an intermittent resting tremor in both upper extremities. Fine motor skills are moderately impaired on the  right and mild to moderately impaired on the left. Reflexes are 1+ throughout. He stands up with mild difficulty and posture is fairly good today, mildly stooped. He walks with decreased stride length and decrease pace, decreased arm swing, balance is mildly impaired. Sensory exam is intact to light touch throughout. Cerebellar testing shows no dysmetria or intention tremor or gait ataxia.   Assessment and Plan:    In summary, LANDON BASSFORD is a very pleasant 83 y.o.-year old male with An underlying medical history of right TIA in January 2003, SDH (s/p left craniotomy in August 2006), hypertension, hypothyroidism, thyroid cancer, partial onset seizures (off Dilantin), lumbar spine disease, status post lower back surgery at L4-5 in January 2014, renal tumor, status post kidney tumor ablative surgery in March 2014, who presents for followup consultation of his right-sided predominant [name]. He was diagnosed in 2010, symptoms date back to late 2009. He has remained fairly stable over the years but had some progression with time as expected. He benefited from an increase in his Sinemet last time. I suggested we keep his medications the same, he feels his prescriptions at the [place]. He did not need a refill today but may need a bridge prescription as he is traveling to [place] for 2 weeks. He is advised to call if he needs a prescription to bridge, we can send to his retail pharmacy for one-time prescription. He looks well today. I suggested he try to hydrate well and keep a 4 hourly schedule for his Sinemet when he gets to his destination in [place] and [place]. I suggested a six-month follow-up with me, sooner as needed. I answered all their questions today and patient and apneas were in agreement.  I spent 25 minutes in total face-to-face time with the patient, more than 50% of which was spent in counseling and coordination of care, reviewing test results, reviewing medication and discussing or  reviewing the diagnosis of PD, its prognosis and treatment options. Pertinent laboratory and imaging test results that were available during this visit with the patient were reviewed by me and considered in my medical decision making (see chart for details).

## 2016-06-27 NOTE — Patient Instructions (Addendum)
We will continue with your meds.  You look stable and look well.

## 2016-07-01 DIAGNOSIS — D49519 Neoplasm of unspecified behavior of unspecified kidney: Secondary | ICD-10-CM | POA: Diagnosis not present

## 2016-07-01 DIAGNOSIS — N400 Enlarged prostate without lower urinary tract symptoms: Secondary | ICD-10-CM | POA: Diagnosis not present

## 2016-07-15 ENCOUNTER — Other Ambulatory Visit (HOSPITAL_COMMUNITY): Payer: Self-pay | Admitting: Interventional Radiology

## 2016-07-15 DIAGNOSIS — N2889 Other specified disorders of kidney and ureter: Secondary | ICD-10-CM

## 2016-07-29 ENCOUNTER — Other Ambulatory Visit: Payer: Self-pay | Admitting: *Deleted

## 2016-07-29 DIAGNOSIS — N2889 Other specified disorders of kidney and ureter: Secondary | ICD-10-CM

## 2016-08-08 ENCOUNTER — Ambulatory Visit (HOSPITAL_COMMUNITY)
Admission: RE | Admit: 2016-08-08 | Discharge: 2016-08-08 | Disposition: A | Discharge status: Home / Self Care | Payer: Medicare HMO | Source: Ambulatory Visit | Attending: Interventional Radiology | Admitting: Interventional Radiology

## 2016-08-08 DIAGNOSIS — N2889 Other specified disorders of kidney and ureter: Secondary | ICD-10-CM | POA: Insufficient documentation

## 2016-08-08 DIAGNOSIS — I714 Abdominal aortic aneurysm, without rupture: Secondary | ICD-10-CM | POA: Diagnosis not present

## 2016-08-08 DIAGNOSIS — N281 Cyst of kidney, acquired: Secondary | ICD-10-CM | POA: Diagnosis not present

## 2016-08-08 DIAGNOSIS — Z8553 Personal history of malignant neoplasm of renal pelvis: Secondary | ICD-10-CM | POA: Diagnosis not present

## 2016-08-08 DIAGNOSIS — K838 Other specified diseases of biliary tract: Secondary | ICD-10-CM | POA: Diagnosis not present

## 2016-08-08 DIAGNOSIS — N1330 Unspecified hydronephrosis: Secondary | ICD-10-CM | POA: Insufficient documentation

## 2016-08-08 LAB — POCT I-STAT CREATININE: Creatinine, Ser: 1.6 mg/dL — ABNORMAL HIGH (ref 0.61–1.24)

## 2016-08-08 MED ORDER — IOPAMIDOL (ISOVUE-300) INJECTION 61%
INTRAVENOUS | Status: AC
  Administered 2016-08-08: 80 mL
  Filled 2016-08-08: qty 100

## 2016-08-09 ENCOUNTER — Other Ambulatory Visit: Payer: Self-pay | Admitting: Primary Care

## 2016-08-09 DIAGNOSIS — I10 Essential (primary) hypertension: Secondary | ICD-10-CM

## 2016-08-09 DIAGNOSIS — E785 Hyperlipidemia, unspecified: Secondary | ICD-10-CM

## 2016-08-09 DIAGNOSIS — E039 Hypothyroidism, unspecified: Secondary | ICD-10-CM

## 2016-08-13 ENCOUNTER — Ambulatory Visit
Admission: RE | Admit: 2016-08-13 | Discharge: 2016-08-13 | Disposition: A | Discharge status: Home / Self Care | Payer: Medicare HMO | Source: Ambulatory Visit | Attending: Interventional Radiology | Admitting: Interventional Radiology

## 2016-08-13 DIAGNOSIS — N2889 Other specified disorders of kidney and ureter: Secondary | ICD-10-CM | POA: Diagnosis not present

## 2016-08-13 HISTORY — PX: IR RADIOLOGIST EVAL & MGMT: IMG5224

## 2016-08-13 NOTE — Progress Notes (Signed)
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   History of present illness:  Has followed with Dr Kathlene Cote since procedure 4 yr follow up today. Pt has no complaints Denies fever/chills Denies pain; N/V Urinating well. Does follow with Dr Trula Slade regarding AAA---to see that office next week actually.  CT 08/08/16:IMPRESSION: 1. Stable exam. No changes in the ablation scar suggests recurrent disease. No metastatic disease in the abdomen. 2. Several tiny hypervascular foci in the liver parenchyma, stable and most consistent with benign process such as flash filling cavernoma or vascular malformation. Attention on follow-up suggested. 3. No change right hydronephrosis with imaging features consistent with UPJ obstruction. 4. Stable bilateral renal cysts. 5. Infrarenal abdominal aortic aneurysm   Past Medical History:  Diagnosis Date  . A-fib ([place])   . Arthritis    "right knee" (08/09/2014)  . Basal cell carcinoma   . Benign prostatic hypertrophy with urinary obstruction   . Complication of anesthesia    "difficulty intubation, had to use fiberoptic 2006"  . Difficult intubation   . DJD (degenerative joint disease)   . Gout   . Hx of colonic polyps   . Hyperlipidemia   . Hypertension    sees [name]  . Hypothyroidism   . Kidney tumor    "tumor on cyst on kidney"   . 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 Mini stroke ([place])   . Parkinson disease ([place])   . Pneumonia    hx of in 1952  . Primary skin squamous cell carcinoma   . Subdural hematoma ([place]) 2006  . TIA (transient ischemic attack)    pt does not recall this hx "but may have had one today" (08/09/2014)    Past Surgical History:  Procedure Laterality Date  . APPENDECTOMY    . BACK SURGERY    . CATARACT EXTRACTION W/ INTRAOCULAR LENS   IMPLANT, BILATERAL Bilateral ~ 1998  . CHOLECYSTECTOMY    . CRYOABLATION  07-10-12   "tumor on cyst on my kidney; had an ablation"  . EYE SURGERY    . INGUINAL HERNIA REPAIR Right   . IR GENERIC HISTORICAL  11/09/2015   IR RADIOLOGIST EVAL & MGMT 11/09/2015 [name], MD [facility] INTERV RAD  . KIDNEY SURGERY    . KNEE ARTHROSCOPY Right   . LUMBAR LAMINECTOMY/DECOMPRESSION MICRODISCECTOMY  05/14/2012   Procedure: LUMBAR LAMINECTOMY/DECOMPRESSION MICRODISCECTOMY 1 LEVEL;  Surgeon: [name], MD;  Location: [place] [facility];  Service: Neurosurgery;  Laterality: Left;  Left Lumbar four-five Laminectomy/Diskectomy/Far lateral diskectomy  . SKIN CANCER EXCISION  "several"   "back of my neck; right eye; clavicle; nose"  . SUBDURAL HEMATOMA EVACUATION VIA CRANIOTOMY  2006  . THYROIDECTOMY     medullary carcinoma  . TONSILLECTOMY      Allergies: Patient has no known allergies.  Medications: Prior to Admission medications   Medication Sig Start Date End Date Taking? Authorizing Provider  apixaban (ELIQUIS) 2.5 MG TABS tablet Take 1 tablet (2.5 mg total) by mouth 2 (two) times daily. 06/25/16  Yes Pleas Koch, NP  carbidopa-levodopa (SINEMET IR) 25-100 MG tablet Take 1.5 tablets by mouth 4 (four) times daily. At 8, 12, 4 PM, and 8 PM 03/05/16  Yes Star Age, MD  hydrALAZINE (APRESOLINE) 25 MG tablet TAKE 1 TABLET THREE TIMES DAILY 05/15/16  Yes Pleas Koch,  NP  levothyroxine (SYNTHROID, LEVOTHROID) 100 MCG tablet TAKE 1 TABLET EVERY DAY BEFORE BREAKFAST 08/09/16  Yes Pleas Koch, NP  losartan-hydrochlorothiazide [facility]) 100-12.5 MG tablet TAKE 1 TABLET EVERY DAY 08/09/16  Yes Pleas Koch, NP  Multiple Vitamin (MULTIVITAMIN WITH MINERALS) TABS Take 1 tablet by mouth daily. Reported on 08/01/2015   Yes Historical Provider, MD  Probiotic Product (ALIGN) 4 MG CAPS Take 1 capsule by mouth daily.   Yes Historical Provider, MD  rasagiline (AZILECT) 1 MG TABS tablet Take 1 tablet  (1 mg total) by mouth daily. 03/05/16  Yes Star Age, MD  simvastatin (ZOCOR) 20 MG tablet TAKE 1 TABLET AT BEDTIME 08/09/16  Yes Pleas Koch, NP     Family History  Problem Relation Age of Onset  . Stroke Mother   . Stroke Father   . Heart disease Father   . Alzheimer's disease Brother   . Alzheimer's disease Sister     Social History   Social History  . Marital status: Widowed    Spouse name: N/A  . Number of children: 3  . Years of education: 15   Occupational History  . retired    Social History Main Topics  . Smoking status: Former Smoker    Packs/day: 0.80    Years: 15.00    Types: Cigarettes    Quit date: 04/15/1965  . Smokeless tobacco: Never Used  . Alcohol use 6.0 oz/week    10 Glasses of wine per week     Comment: 1 glass of wine every night (6oz glass)  . Drug use: No  . Sexual activity: Yes   Other Topics Concern  . Not on file   Social History Narrative   Married.   Lives at home with significant other.   Retried. 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   Enjoys golfing, yard work, spending time with family.          Vital Signs: BP (!) 100/51 (BP Location: Left Arm, Patient Position: Sitting, Cuff Size: Normal)   Pulse (!) 50   Temp 97.5 F (36.4 C) (Oral)   Resp 14   Ht 6\' 4"  (1.93 m)   Wt 185 lb (83.9 kg)   SpO2 98%   BMI 22.52 kg/m   Physical Exam  Constitutional: He is oriented to person, place, and time.  Cardiovascular: Normal rate, regular rhythm and normal heart sounds.   Pulmonary/Chest: Effort normal and breath sounds normal.  Abdominal: Soft. Bowel sounds are normal.  Musculoskeletal: Normal range of motion.  Neurological: He is alert and oriented to person, place, and time.  Skin: Skin is warm and dry.  Psychiatric: He has a normal mood and affect. His behavior is normal.  Nursing note and vitals reviewed.   Imaging: No results found.  Labs:  CBC:  Recent Labs  11/28/15 1451  WBC 6.0  HGB 15.2  HCT 46.1    PLT 172    COAGS: No results for input(s): INR, APTT in the last 8760 hours.  BMP:  Recent Labs  11/03/15 1633 11/28/15 1451 06/20/16 1010 08/08/16 1406  NA  --  138 141  --   K  --  4.0 3.4*  --   CL  --  98 101  --   CO2  --  29 35*  --   GLUCOSE  --  80 121*  --   BUN 23 22 24*  --   CALCIUM  --  8.7 9.0  --  CREATININE 1.63* 1.49* 1.51* 1.60*  GFRNONAA 38*  --   --   --   GFRAA 44*  --   --   --     LIVER FUNCTION TESTS:  Recent Labs  06/20/16 1010  BILITOT 0.6  AST 13  ALT 4  ALKPHOS 52  PROT 6.7  ALBUMIN 4.2    Assessment:   07/10/12 CT guided percutaneous cryoablation of right renal oncocytic neoplasm. Doing quite well No complaints CT 08/08/16 shows no new lesions - no recurrence. No further follow up with Dr Kathlene Cote unless new symptoms or problems. To follow with Dr Trula Slade next week - evaluation of AAA.   SignedMonia Sabal A 08/13/2016, 12:05 PM   Please refer to Dr. Kathlene Cote attestation of this note for management and plan.

## 2016-08-21 ENCOUNTER — Encounter: Payer: Self-pay | Admitting: Family

## 2016-08-26 ENCOUNTER — Encounter: Payer: Self-pay | Admitting: Family

## 2016-08-26 ENCOUNTER — Ambulatory Visit (INDEPENDENT_AMBULATORY_CARE_PROVIDER_SITE_OTHER): Payer: Medicare HMO | Admitting: Family

## 2016-08-26 VITALS — BP 162/86 | Temp 97.6°F | Resp 18 | Ht 76.0 in | Wt 178.0 lb

## 2016-08-26 DIAGNOSIS — I714 Abdominal aortic aneurysm, without rupture, unspecified: Secondary | ICD-10-CM

## 2016-08-26 DIAGNOSIS — R0989 Other specified symptoms and signs involving the circulatory and respiratory systems: Secondary | ICD-10-CM

## 2016-08-26 NOTE — Progress Notes (Signed)
[facility] [place]   CC: Follow up Abdominal Aortic Aneurysm  History of Present Illness  William [name] is a 83 y.o. (October 11, 1928) male patient of Dr. Trula Slade who has been treated by Dr. 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 His CTA for follow up revealed a 3.9cm AAA. Pt states Dr. 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  Pt denies any abdominal pain, he has a history of back surgery, no new back pain.   He denies claudication symptoms with walking.  He smoked for about 10 years, quit in the 1960's.   In April or May or 2016 he was in the ER for atrial fibrillation. His HTN medications have been tweaked for better control. He follows up with Dr. Percival Spanish for this. He also has [name] and he states that he was on a medication for this that could cause hypertension over time. He has discontinued this medication and he states his blood pressure has been under better control, but wife states his blood pressure has a tendency to drop.     He also had a TIA in April or May of 2016 in which he states he was reading an article on the computer and couldn't comprehend what he was reading. When he was trying to speak, he was unable to and 911 was called. By the time the responder arrived, his symptoms had resolved.  Pt and wife deny any subsequent TIA or stroke.   He is on a statin for his cholesterol. He is on an ARB for his HTN. He is also on an aspirin. He does take Eliquis due to his history of Afib, states he has not has any subsequent known atrial fib.  Pt Diabetic: No Pt smoker: former smoker, quit about 1967, started smoking about age 51  Past Medical History:  Diagnosis Date  . A-fib ([place])   . Arthritis    "right knee" (08/09/2014)  . Basal cell carcinoma   . Benign prostatic hypertrophy with urinary obstruction   .  Complication of anesthesia    "difficulty intubation, had to use fiberoptic 2006"  . Difficult intubation   . DJD (degenerative joint disease)   . Gout   . Hx of colonic polyps   . Hyperlipidemia   . Hypertension    sees [name]  . Hypothyroidism   . Kidney tumor    "tumor on cyst on kidney"   . 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 Mini stroke ([place])   . Parkinson disease ([place])   . Pneumonia    hx of in 1952  . Primary skin squamous cell carcinoma   . Subdural hematoma ([place]) 2006  . TIA (transient ischemic attack)    pt does not recall this hx "but may have had one today" (08/09/2014)   Past Surgical History:  Procedure Laterality Date  . APPENDECTOMY    . BACK SURGERY    . CATARACT EXTRACTION W/ INTRAOCULAR LENS  IMPLANT, BILATERAL Bilateral ~ 1998  . CHOLECYSTECTOMY    . CRYOABLATION  07-10-12   "tumor on cyst on my kidney; had an ablation"  . EYE SURGERY    . INGUINAL HERNIA REPAIR Right   . IR GENERIC HISTORICAL  11/09/2015   IR RADIOLOGIST EVAL & MGMT 11/09/2015 [name], MD [facility] INTERV RAD  . KIDNEY SURGERY    . KNEE  ARTHROSCOPY Right   . LUMBAR LAMINECTOMY/DECOMPRESSION MICRODISCECTOMY  05/14/2012   Procedure: LUMBAR LAMINECTOMY/DECOMPRESSION MICRODISCECTOMY 1 LEVEL;  Surgeon: [name], MD;  Location: [facility];  Service: Neurosurgery;  Laterality: Left;  Left Lumbar four-five Laminectomy/Diskectomy/Far lateral diskectomy  . SKIN CANCER EXCISION  "several"   "back of my neck; right eye; clavicle; nose"  . SUBDURAL HEMATOMA EVACUATION VIA CRANIOTOMY  2006  . THYROIDECTOMY     medullary carcinoma  . TONSILLECTOMY     Social History Social History   Social History  . Marital status: Widowed    Spouse name: N/A  . Number of children: 3  . Years of education: 15   Occupational History  . retired    Social History Main Topics  . Smoking status: Former Smoker    Packs/day: 0.80    Years: 15.00    Types:  Cigarettes    Quit date: 04/15/1965  . Smokeless tobacco: Never Used  . Alcohol use 3.0 oz/week    5 Glasses of wine per week     Comment: 1 glass of wine every night (6oz glass)  . Drug use: No  . Sexual activity: Yes   Other Topics Concern  . Not on file   Social History Narrative   Married.   Lives at home with significant other.   Retried. 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   Enjoys golfing, yard work, spending time with family.        Family History Family History  Problem Relation Age of Onset  . Stroke Mother   . Stroke Father   . Heart disease Father   . Alzheimer's disease Brother   . Alzheimer's disease Sister     Current Outpatient Prescriptions on File Prior to Visit  Medication Sig Dispense Refill  . apixaban (ELIQUIS) 2.5 MG TABS tablet Take 1 tablet (2.5 mg total) by mouth 2 (two) times daily. 180 tablet 3  . carbidopa-levodopa (SINEMET IR) 25-100 MG tablet Take 1.5 tablets by mouth 4 (four) times daily. At 8, 12, 4 PM, and 8 PM 360 tablet 3  . hydrALAZINE (APRESOLINE) 25 MG tablet TAKE 1 TABLET THREE TIMES DAILY 270 tablet 3  . levothyroxine (SYNTHROID, LEVOTHROID) 100 MCG tablet TAKE 1 TABLET EVERY DAY BEFORE BREAKFAST 90 tablet 1  . losartan-hydrochlorothiazide (HYZAAR) 100-12.5 MG tablet TAKE 1 TABLET EVERY DAY 90 tablet 1  . Multiple Vitamin (MULTIVITAMIN WITH MINERALS) TABS Take 1 tablet by mouth daily. Reported on 08/01/2015    . Probiotic Product (ALIGN) 4 MG CAPS Take 1 capsule by mouth daily.    . rasagiline (AZILECT) 1 MG TABS tablet Take 1 tablet (1 mg total) by mouth daily. 90 tablet 3  . simvastatin (ZOCOR) 20 MG tablet TAKE 1 TABLET AT BEDTIME 90 tablet 1  . [DISCONTINUED] losartan (COZAAR) 50 MG tablet Take 1 tablet (50 mg total) by mouth daily. 30 tablet 1   No current facility-administered medications on file prior to visit.    No Known Allergies  ROS: See HPI for pertinent positives and negatives.  Physical Examination  Vitals:    08/26/16 1345 08/26/16 1347  BP: (!) 160/89 (!) 162/86  Resp: 18   Temp: 97.6 F (36.4 C)   TempSrc: Oral   SpO2: 96%   Weight: 178 lb (80.7 kg)   Height: 6\' 4"  (1.93 m)    Body mass index is 21.67 kg/m.  General: WDWN in NAD  Gait: Normal  HENT: WNL, normocephalic  Pulmonary: normal non-labored breathing,  CTAB Cardiac: RRR, no detected murmurs, rubs or gallops; no carotid bruits  Abdomen: soft, NT, no palpable masses; well healed cholecystectomy scar.  Skin: without rashes, without ulcers  Vascular Exam/Pulses:   Right  Left   Radial  2+ palpable 2+ palpable  Femoral  3+ palpable 3+ palpable  Popliteal  3+ palpable 3+ palpable   DP  2+ palpable 1+ palpable  PT  Unable to palpate  Unable to palpate    Extremities: without ischemic changes, without Gangrene , without cellulitis; without open wounds;  Musculoskeletal: no muscle wasting or atrophy. Enlarged and prominent base of right great toe metatarsal joint (pt reports site of gout exacerbations).  Neurologic: A&O X 3; Appropriate Affect ; SENSATION: normal; MOTOR FUNCTION: moving all extremities equally. Speech is fluent/normal; moderate tremor of right hand.    CTA abd/pelvis requested by Dr. Kathlene Cote 413/17: Vascular/Lymphatic: Grossly unchanged infrarenal abdominal aortic aneurysm measuring 3.7 x 3.5 cm, previously 3.9 x 3.4 cm. No retroperitoneal adenopathy. Musculoskeletal: Lower thoracic and lumbar spine degenerative changes. No aggressive or acute appearing osseous lesions.  IMPRESSION: Indeterminate subpleural opacity within the right lower lobe which may represent infection, scarring and/or atelectasis however given the difference in appearance over time, neoplasm is not excluded. Recommend dedicated follow-up chest CT in 1 month. If this persists, a dedicated PET-CT could be considered.  Grossly unchanged cryoablation site within the lower pole of the right kidney. No  evidence for localized recurrence or metastatic disease within the abdomen.  Unchanged bilateral renal cysts and chronic right UPJ obstruction.  Prior cholecystectomy with unchanged biliary ductal dilatation.  Re- demonstrated infrarenal abdominal aortic aneurysm. Recommend followup by ultrasound in 2 years. This recommendation follows ACR consensus guidelines: White Paper of the ACR Incidental Findings Committee II on Vascular Findings. Joellyn Rued Radiol 2013; 10:789-794.    08-08-16 CTA abd/pelvis requested by Dr. Kathlene Cote: Infrarenal abdominal aortic aneurysm not substantially changed at 3.8 cm maximum diameter today compared to 3.7 cm previously.    09-26-15 bilateral popliteal artery duplex: No popliteal artery aneurysms.   Pt meds includes:  Statin: Yes.  Beta Blocker: No.  Aspirin: No ACEI: No.  ARB: Yes.  Other Antiplatelet/Anticoagulant: Yes. Eliquis    Medical Decision Making  The patient is a 83 y.o. male who present with asymptomatic small AAA with no increase in size as noted by CTA abd/pelvis on 08-08-16: not substantially changed at 3.8 cm maximum diameter today compared to 3.7 cm previously.   He has prominent bilateral popliteal pulses, denies claudication symptoms with walking.  Bilateral popliteal artery Duplex on 09-26-15 indicates no focal popliteal aneurysm visualized bilaterally.   Pt's visit with Dr. Kathlene Cote, interventional radiologist, was within the last couple of weeks; pt states Dr. Kathlene Cote may request one more yearly CTA, pt states Dr. Kathlene Cote told him he was doing well.    PLAN:  If pt does not have a CTA in a year requested by Dr. 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    Consideration for repair of AAA would be made when the size is 5.0 cm, growth > 1 cm/yr, and symptomatic status.  I emphasized the importance of maximal medical management including  strict control of blood pressure, blood glucose, and lipid levels, antiplatelet agents, obtaining regular exercise, and continued cessation of smoking.   The patient was given information about AAA including signs, symptoms, treatment, and how to minimize the risk of enlargement and rupture  of aneurysms.    The patient was advised to call 911 should the patient experience sudden onset abdominal or back pain.   Thank you for allowing [place] to participate in this patient's care.  Clemon Chambers, RN, MSN, FNP-C [facility] [place] Office: [facility] Physician: Trula Slade  08/26/2016, 1:55 PM

## 2016-08-26 NOTE — Patient Instructions (Signed)
Abdominal Aortic Aneurysm Blood pumps away from the heart through tubes (blood vessels) called arteries. Aneurysms are weak or damaged places in the wall of an artery. It bulges out like a balloon. An abdominal aortic aneurysm happens in the main artery of the body (aorta). It can burst or tear, causing bleeding inside the body. This is an emergency. It needs treatment right away. What are the causes? The exact cause is unknown. Things that could cause this problem include:  Fat and other substances building up in the lining of a tube.  Swelling of the walls of a blood vessel.  Certain tissue diseases.  Belly (abdominal) trauma.  An infection in the main artery of the body.  What increases the risk? There are things that make it more likely for you to have an aneurysm. These include:  Being over the age of 81 years old.  Having high blood pressure (hypertension).  Being a male.  Being white.  Being very overweight (obese).  Having a family history of aneurysm.  Using tobacco products.  What are the signs or symptoms? Symptoms depend on the size of the aneurysm and how fast it grows. There may not be symptoms. If symptoms occur, they can include:  Pain (belly, side, lower back, or groin).  Feeling full after eating a small amount of food.  Feeling sick to your stomach (nauseous), throwing up (vomiting), or both.  Feeling a lump in your belly that feels like it is beating (pulsating).  Feeling like you will pass out (faint).  How is this treated?  Medicine to control blood pressure and pain.  Imaging tests to see if the aneurysm gets bigger.  Surgery. How is this prevented? To lessen your chance of getting this condition:  Stop smoking. Stop chewing tobacco.  Limit or avoid alcohol.  Keep your blood pressure, blood sugar, and cholesterol within normal limits.  Eat less salt.  Eat foods low in saturated fats and cholesterol. These are found in animal and  whole dairy products.  Eat more fiber. Fiber is found in whole grains, vegetables, and fruits.  Keep a healthy weight.  Stay active and exercise often.  This information is not intended to replace advice given to you by your health care provider. Make sure you discuss any questions you have with your health care provider. Document Released: 07/27/2012 Document Revised: 09/07/2015 Document Reviewed: 05/01/2012 Elsevier Interactive Patient Education  2017 Elsevier Inc.  

## 2016-08-30 NOTE — Addendum Note (Signed)
Addended by: Lianne Cure A on: 08/30/2016 10:08 AM   Modules accepted: Orders

## 2016-09-27 ENCOUNTER — Encounter: Payer: Self-pay | Admitting: Interventional Radiology

## 2016-10-02 ENCOUNTER — Encounter: Payer: Self-pay | Admitting: Neurology

## 2016-10-02 ENCOUNTER — Encounter: Payer: Self-pay | Admitting: Primary Care

## 2016-10-03 ENCOUNTER — Telehealth: Payer: Self-pay

## 2016-10-03 MED ORDER — CARBIDOPA-LEVODOPA 25-100 MG PO TABS: 1.5000 | ORAL_TABLET | Freq: Four times a day (QID) | ORAL | 0 refills | Status: DC

## 2016-10-03 NOTE — Telephone Encounter (Signed)
I called pt in response to his mychart message. He reports that his bottle of sinemet partially spilled in the mud. I advised him that I called the [facility] to ask them when he last had his sinemet refilled and they would not disclose any information to me. Pt says that the [facility] is due to refill his sinemet on 10/24/2016 for a 90 day supply. I advised him that Dr. Rexene Alberts recommended a 20 day supply of sinemet sent to his local pharmacy to bridge him over until 10/24/2016. Pt is ok with this and I confirmed he is still taking his sinement 1.5 tablets QID and therefore would need 120 tablets for a 20 day supply. Pt confirmed this and asked that it be sent to [facility]. RX sent per verbal order from Dr. Rexene Alberts.

## 2016-12-03 ENCOUNTER — Encounter (HOSPITAL_COMMUNITY): Payer: Self-pay | Admitting: Emergency Medicine

## 2016-12-03 ENCOUNTER — Emergency Department (HOSPITAL_COMMUNITY): Payer: Medicare HMO

## 2016-12-03 ENCOUNTER — Inpatient Hospital Stay (HOSPITAL_COMMUNITY)
Admission: EM | Admit: 2016-12-03 | Discharge: 2016-12-05 | DRG: 281 | Disposition: A | Discharge status: Home / Self Care | Payer: Medicare HMO | Attending: Cardiology | Admitting: Cardiology

## 2016-12-03 DIAGNOSIS — I129 Hypertensive chronic kidney disease with stage 1 through stage 4 chronic kidney disease, or unspecified chronic kidney disease: Secondary | ICD-10-CM | POA: Diagnosis present

## 2016-12-03 DIAGNOSIS — G20 Parkinson's disease: Secondary | ICD-10-CM | POA: Diagnosis not present

## 2016-12-03 DIAGNOSIS — I480 Paroxysmal atrial fibrillation: Secondary | ICD-10-CM | POA: Diagnosis not present

## 2016-12-03 DIAGNOSIS — I214 Non-ST elevation (NSTEMI) myocardial infarction: Principal | ICD-10-CM

## 2016-12-03 DIAGNOSIS — I429 Cardiomyopathy, unspecified: Secondary | ICD-10-CM | POA: Diagnosis present

## 2016-12-03 DIAGNOSIS — G903 Multi-system degeneration of the autonomic nervous system: Secondary | ICD-10-CM | POA: Diagnosis not present

## 2016-12-03 DIAGNOSIS — Z7901 Long term (current) use of anticoagulants: Secondary | ICD-10-CM

## 2016-12-03 DIAGNOSIS — Z8249 Family history of ischemic heart disease and other diseases of the circulatory system: Secondary | ICD-10-CM

## 2016-12-03 DIAGNOSIS — R001 Bradycardia, unspecified: Secondary | ICD-10-CM | POA: Diagnosis not present

## 2016-12-03 DIAGNOSIS — E7800 Pure hypercholesterolemia, unspecified: Secondary | ICD-10-CM | POA: Diagnosis not present

## 2016-12-03 DIAGNOSIS — M109 Gout, unspecified: Secondary | ICD-10-CM | POA: Diagnosis present

## 2016-12-03 DIAGNOSIS — Z85828 Personal history of other malignant neoplasm of skin: Secondary | ICD-10-CM | POA: Diagnosis not present

## 2016-12-03 DIAGNOSIS — R404 Transient alteration of awareness: Secondary | ICD-10-CM | POA: Diagnosis not present

## 2016-12-03 DIAGNOSIS — N183 Chronic kidney disease, stage 3 unspecified: Secondary | ICD-10-CM | POA: Diagnosis present

## 2016-12-03 DIAGNOSIS — Z85850 Personal history of malignant neoplasm of thyroid: Secondary | ICD-10-CM | POA: Diagnosis not present

## 2016-12-03 DIAGNOSIS — Z86010 Personal history of colonic polyps: Secondary | ICD-10-CM | POA: Diagnosis not present

## 2016-12-03 DIAGNOSIS — I421 Obstructive hypertrophic cardiomyopathy: Secondary | ICD-10-CM | POA: Diagnosis not present

## 2016-12-03 DIAGNOSIS — Z8673 Personal history of transient ischemic attack (TIA), and cerebral infarction without residual deficits: Secondary | ICD-10-CM

## 2016-12-03 DIAGNOSIS — E785 Hyperlipidemia, unspecified: Secondary | ICD-10-CM | POA: Diagnosis present

## 2016-12-03 DIAGNOSIS — I351 Nonrheumatic aortic (valve) insufficiency: Secondary | ICD-10-CM | POA: Diagnosis not present

## 2016-12-03 DIAGNOSIS — Z86718 Personal history of other venous thrombosis and embolism: Secondary | ICD-10-CM | POA: Diagnosis not present

## 2016-12-03 DIAGNOSIS — I2510 Atherosclerotic heart disease of native coronary artery without angina pectoris: Secondary | ICD-10-CM | POA: Diagnosis present

## 2016-12-03 DIAGNOSIS — Z87891 Personal history of nicotine dependence: Secondary | ICD-10-CM

## 2016-12-03 DIAGNOSIS — R42 Dizziness and giddiness: Secondary | ICD-10-CM | POA: Diagnosis not present

## 2016-12-03 DIAGNOSIS — I10 Essential (primary) hypertension: Secondary | ICD-10-CM

## 2016-12-03 DIAGNOSIS — R002 Palpitations: Secondary | ICD-10-CM | POA: Diagnosis not present

## 2016-12-03 HISTORY — DX: Long term (current) use of anticoagulants: Z79.01

## 2016-12-03 LAB — BASIC METABOLIC PANEL
Anion gap: 10 (ref 5–15)
BUN: 22 mg/dL — AB (ref 6–20)
CALCIUM: 8.5 mg/dL — AB (ref 8.9–10.3)
CO2: 28 mmol/L (ref 22–32)
CREATININE: 1.47 mg/dL — AB (ref 0.61–1.24)
Chloride: 101 mmol/L (ref 101–111)
GFR calc Af Amer: 48 mL/min — ABNORMAL LOW (ref >60)
GFR calc non Af Amer: 41 mL/min — ABNORMAL LOW (ref >60)
Glucose, Bld: 97 mg/dL (ref 65–99)
Potassium: 3.6 mmol/L (ref 3.5–5.1)
Sodium: 139 mmol/L (ref 135–145)

## 2016-12-03 LAB — CBC WITH DIFFERENTIAL/PLATELET
BASOS PCT: 0 %
Basophils Absolute: 0 10*3/uL (ref 0.0–0.1)
EOS ABS: 0.1 10*3/uL (ref 0.0–0.7)
Eosinophils Relative: 1 %
HCT: 47.1 % (ref 39.0–52.0)
Hemoglobin: 15.3 g/dL (ref 13.0–17.0)
Lymphocytes Relative: 15 %
Lymphs Abs: 1 10*3/uL (ref 0.7–4.0)
MCH: 29.9 pg (ref 26.0–34.0)
MCHC: 32.5 g/dL (ref 30.0–36.0)
MCV: 92.2 fL (ref 78.0–100.0)
MONOS PCT: 8 %
Monocytes Absolute: 0.5 10*3/uL (ref 0.1–1.0)
Neutro Abs: 5.1 10*3/uL (ref 1.7–7.7)
Neutrophils Relative %: 76 %
Platelets: 168 10*3/uL (ref 150–400)
RBC: 5.11 MIL/uL (ref 4.22–5.81)
RDW: 14.1 % (ref 11.5–15.5)
WBC: 6.7 10*3/uL (ref 4.0–10.5)

## 2016-12-03 LAB — I-STAT TROPONIN, ED: TROPONIN I, POC: 1.66 ng/mL — AB (ref 0.00–0.08)

## 2016-12-03 LAB — CBG MONITORING, ED: Glucose-Capillary: 138 mg/dL — ABNORMAL HIGH (ref 65–99)

## 2016-12-03 LAB — TROPONIN I: TROPONIN I: 8.33 ng/mL — AB (ref <0.03)

## 2016-12-03 LAB — BRAIN NATRIURETIC PEPTIDE: B Natriuretic Peptide: 67.8 pg/mL (ref 0.0–100.0)

## 2016-12-03 MED ORDER — ONDANSETRON HCL 4 MG/2ML IJ SOLN: 4.0000 mg | Freq: Four times a day (QID) | INTRAMUSCULAR | Status: DC | PRN

## 2016-12-03 MED ORDER — CARBIDOPA-LEVODOPA 25-100 MG PO TABS: 1.0000 | ORAL_TABLET | Freq: Three times a day (TID) | ORAL | Status: DC

## 2016-12-03 MED ORDER — HYDRALAZINE HCL 25 MG PO TABS: 25.0000 mg | ORAL_TABLET | Freq: Three times a day (TID) | ORAL | Status: DC

## 2016-12-03 MED ORDER — ASPIRIN 300 MG RE SUPP: 300.0000 mg | RECTAL | Status: DC

## 2016-12-03 MED ORDER — ASPIRIN 81 MG PO CHEW
324.0000 mg | CHEWABLE_TABLET | Freq: Once | ORAL | Status: AC
  Administered 2016-12-03: 324 mg via ORAL
  Filled 2016-12-03: qty 4

## 2016-12-03 MED ORDER — ASPIRIN EC 81 MG PO TBEC
81.0000 mg | DELAYED_RELEASE_TABLET | Freq: Every day | ORAL | Status: DC
  Administered 2016-12-04: 81 mg via ORAL
  Filled 2016-12-03 (×2): qty 1

## 2016-12-03 MED ORDER — LOSARTAN POTASSIUM 50 MG PO TABS
100.0000 mg | ORAL_TABLET | Freq: Every day | ORAL | Status: DC
  Administered 2016-12-04 – 2016-12-05 (×2): 100 mg via ORAL
  Filled 2016-12-03 (×2): qty 2

## 2016-12-03 MED ORDER — NITROGLYCERIN 0.4 MG SL SUBL: 0.4000 mg | SUBLINGUAL_TABLET | SUBLINGUAL | Status: DC | PRN

## 2016-12-03 MED ORDER — RASAGILINE MESYLATE 1 MG PO TABS
1.0000 mg | ORAL_TABLET | Freq: Every day | ORAL | Status: DC
  Filled 2016-12-03: qty 1

## 2016-12-03 MED ORDER — ACETAMINOPHEN 325 MG PO TABS: 650.0000 mg | ORAL_TABLET | ORAL | Status: DC | PRN

## 2016-12-03 MED ORDER — LEVOTHYROXINE SODIUM 100 MCG PO TABS: 100.0000 ug | ORAL_TABLET | Freq: Every day | ORAL | Status: DC

## 2016-12-03 MED ORDER — ASPIRIN 81 MG PO CHEW: 324.0000 mg | CHEWABLE_TABLET | ORAL | Status: DC

## 2016-12-03 MED ORDER — SIMVASTATIN 20 MG PO TABS: 20.0000 mg | ORAL_TABLET | Freq: Every day | ORAL | Status: DC

## 2016-12-03 NOTE — ED Provider Notes (Signed)
[facility] DEPT Provider Note   CSN: [id number] Arrival date & time: 12/03/16  1720     History   Chief Complaint Chief Complaint  Patient presents with  . Palpitations    HPI William [name] is a 83 y.o. male with a past medical history of 1 episode of A. fib about 2 and half years ago, on eloquent's. Patient also has a history of [name]. Today he went to a kickboxing class for Parkinson's. He states that just prior to going he had ribs from [employer] and stitches blood pressure medicine. During the exercise the instructor noticed he was looking short of breath. The patient was asked to sit down. He states that he felt a bit dizzy and short of breath and felt his heart fluttering. He was unsure if he had another bout of A. fib. Patient sat the rest of the class and then drove himself home. When he arrived his wife noticed that he was looking short of breath. The patient wanted to wait a while since he lasted for about 10 minutes before his wife called EMS and had him brought to the ER for further evaluation. He states that he feels fine now. He denies any chest pain, cough, fevers. Hemostasis or history of DVTs.  HPI  Past Medical History:  Diagnosis Date  . A-fib ([place])   . Arthritis    "right knee" (08/09/2014)  . Basal cell carcinoma   . Benign prostatic hypertrophy with urinary obstruction   . Complication of anesthesia    "difficulty intubation, had to use fiberoptic 2006"  . Difficult intubation   . DJD (degenerative joint disease)   . Gout   . Hx of colonic polyps   . Hyperlipidemia   . Hypertension    sees [name]  . Hypothyroidism   . Kidney tumor    "tumor on cyst on kidney"   . 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 Mini stroke ([place])   . Parkinson disease ([place])   . Pneumonia    hx of in 1952  . Primary skin squamous cell carcinoma   . Subdural hematoma ([place]) 2006  . TIA (transient ischemic attack)    pt  does not recall this hx "but may have had one today" (08/09/2014)    Patient Active Problem List   Diagnosis Date Noted  . Long term current use of anticoagulant therapy 12/01/2015  . Abnormal CT of the chest 11/29/2015  . Medicare annual wellness visit, subsequent 06/15/2015  . Effusion of right knee 02/23/2015  . HLD (hyperlipidemia)   . TIA (transient ischemic attack) 08/09/2014  . Chronic kidney disease 08/09/2014  . Atrial fibrillation ([place]) 08/09/2014  . Right renal mass   . Atrial fibrillation with rapid ventricular response ([place]) 07/17/2014  . Diverticulosis of colon without hemorrhage 06/16/2013  . Post-operative hypothyroidism 06/16/2013  . Renal oncocytoma 06/09/2012  . LBP (low back pain) 06/09/2012  . Parkinson disease, symptomatic ([place]) 06/06/2011  . CARCINOMA, SKIN, SQUAMOUS CELL 12/10/2007  . MALIGNANT NEOPLASM OF THYROID GLAND 12/10/2007  . SUBDURAL HEMATOMA 12/10/2007  . BPH (benign prostatic hypertrophy) 12/10/2007  . Essential hypertension 04/22/2007  . Generalized atherosclerosis 04/22/2007  . Osteoarthritis 04/22/2007    Past Surgical History:  Procedure Laterality Date  . APPENDECTOMY    . BACK SURGERY    . CATARACT EXTRACTION W/ INTRAOCULAR LENS  IMPLANT, BILATERAL Bilateral ~ 1998  . CHOLECYSTECTOMY    . CRYOABLATION  07-10-12   "  tumor on cyst on my kidney; had an ablation"  . EYE SURGERY    . INGUINAL HERNIA REPAIR Right   . IR GENERIC HISTORICAL  11/09/2015   IR RADIOLOGIST EVAL & MGMT 11/09/2015 [name], MD [facility] INTERV RAD  . IR RADIOLOGIST EVAL & MGMT  08/13/2016  . KIDNEY SURGERY    . KNEE ARTHROSCOPY Right   . LUMBAR LAMINECTOMY/DECOMPRESSION MICRODISCECTOMY  05/14/2012   Procedure: LUMBAR LAMINECTOMY/DECOMPRESSION MICRODISCECTOMY 1 LEVEL;  Surgeon: [name], MD;  Location: [facility];  Service: Neurosurgery;  Laterality: Left;  Left Lumbar four-five Laminectomy/Diskectomy/Far lateral diskectomy  . SKIN CANCER EXCISION  "several"     "back of my neck; right eye; clavicle; nose"  . SUBDURAL HEMATOMA EVACUATION VIA CRANIOTOMY  2006  . THYROIDECTOMY     medullary carcinoma  . TONSILLECTOMY         Home Medications    Prior to Admission medications   Medication Sig Start Date End Date Taking? Authorizing Provider  apixaban (ELIQUIS) 2.5 MG TABS tablet Take 1 tablet (2.5 mg total) by mouth 2 (two) times daily. 06/25/16   Pleas Koch, NP  carbidopa-levodopa (SINEMET IR) 25-100 MG tablet Take 1.5 tablets by mouth 4 (four) times daily. At 8, 12, 4 PM, and 8 PM 03/05/16   Star Age, MD  carbidopa-levodopa (SINEMET IR) 25-100 MG tablet Take 1.5 tablets by mouth 4 (four) times daily. 10/03/16   Star Age, MD  hydrALAZINE (APRESOLINE) 25 MG tablet TAKE 1 TABLET THREE TIMES DAILY 05/15/16   Pleas Koch, NP  levothyroxine (SYNTHROID, LEVOTHROID) 100 MCG tablet TAKE 1 TABLET EVERY DAY BEFORE BREAKFAST 08/09/16   Pleas Koch, NP  losartan-hydrochlorothiazide [facility]) 100-12.5 MG tablet TAKE 1 TABLET EVERY DAY 08/09/16   Pleas Koch, NP  Multiple Vitamin (MULTIVITAMIN WITH MINERALS) TABS Take 1 tablet by mouth daily. Reported on 08/01/2015    [provider]  Probiotic Product (ALIGN) 4 MG CAPS Take 1 capsule by mouth daily.    [provider]  rasagiline (AZILECT) 1 MG TABS tablet Take 1 tablet (1 mg total) by mouth daily. 03/05/16   Star Age, MD  simvastatin (ZOCOR) 20 MG tablet TAKE 1 TABLET AT BEDTIME 08/09/16   Pleas Koch, NP    Family History Family History  Problem Relation Age of Onset  . Stroke Mother   . Stroke Father   . Heart disease Father   . Alzheimer's disease Brother   . Alzheimer's disease Sister     Social History Social History  Substance Use Topics  . Smoking status: Former Smoker    Packs/day: 0.80    Years: 15.00    Types: Cigarettes    Quit date: 04/15/1965  . Smokeless tobacco: Never Used  . Alcohol use 3.0 oz/week    5 Glasses of wine  per week     Comment: 1 glass of wine every night (6oz glass)     Allergies   Patient has no known allergies.   Review of Systems Review of Systems  Ten systems reviewed and are negative for acute change, except as noted in the HPI.   Physical Exam Updated Vital Signs BP (!) 177/98 (BP Location: Left Arm)   Pulse 76   Temp 98.1 F (36.7 C) (Oral)   Resp 16   Ht 6\' 3"  (1.905 m)   Wt 81.6 kg (180 lb)   SpO2 95%   BMI 22.50 kg/m   Physical Exam  Constitutional: He is oriented  to person, place, and time. He appears well-developed and well-nourished. No distress.  HENT:  Head: Normocephalic and atraumatic.  Eyes: Conjunctivae are normal. No scleral icterus.  Neck: Normal range of motion. Neck supple.  Cardiovascular: Normal rate, regular rhythm and normal heart sounds.   Pulmonary/Chest: Effort normal and breath sounds normal. No respiratory distress.  Abdominal: Soft. There is no tenderness.  Musculoskeletal: He exhibits no edema.  Neurological: He is alert and oriented to person, place, and time.  Tremulous, more so on the right  Skin: Skin is warm and dry. He is not diaphoretic.  Psychiatric: His behavior is normal.  Nursing note and vitals reviewed.    ED Treatments / Results  Labs (all labs ordered are listed, but only abnormal results are displayed) Labs Reviewed  BASIC METABOLIC PANEL  CBC WITH DIFFERENTIAL/PLATELET  BRAIN NATRIURETIC PEPTIDE  I-STAT TROPONIN, ED    EKG  EKG Interpretation None       Radiology No results found.  Procedures .Critical Care Performed by: Margarita Mail Authorized by: Margarita Mail   Critical care provider statement:    Critical care time (minutes):  70   Critical care was necessary to treat or prevent imminent or life-threatening deterioration of the following conditions:  Cardiac failure   Critical care was time spent personally by me on the following activities:  Development of treatment plan with patient  or surrogate, discussions with consultants, evaluation of patient's response to treatment, interpretation of cardiac output measurements, ordering and performing treatments and interventions, ordering and review of laboratory studies, ordering and review of radiographic studies, re-evaluation of patient's condition and review of old charts   (including critical care time)  Medications Ordered in ED Medications - No data to display   Initial Impression / Assessment and Plan / ED Course  I have reviewed the triage vital signs and the nursing notes.  Pertinent labs & imaging results that were available during my care of the patient were reviewed by me and considered in my medical decision making (see chart for details).  Clinical Course as of Dec 05 50  Tue Dec 03, 2016  1927 Patient Troponin Is markedly elevated.  He is on Eliquis  so I have not begun Heparin  Patient is given PO ASA 324. Will consult with cardiology.  Troponin i, poc: (!!) 1.66 [AH]    Clinical Course User Index [AH] Margarita Mail, PA-C    Patient with apparent NSTEMI. He will be admitted by cardiology. Patient is stable through visit and cp free   Final Clinical Impressions(s) / ED Diagnoses   Final diagnoses:  NSTEMI (non-ST elevated myocardial infarction) [facility])    New Prescriptions New Prescriptions   No medications on file     Margarita Mail, PA-C 12/04/16 0103    Little, Wenda Overland, MD 12/05/16 2027

## 2016-12-03 NOTE — Consult Note (Deleted)
Cardiology H&P:   Patient ID: William [name]; [id number]; 1928-09-27   Admit date: 12/03/2016 Date of Consult: 12/03/2016  Primary Care Provider: Pleas Koch, NP Primary Cardiologist: Dr William [name]   Patient Profile:   William [name] is a 83 y.o. male with a hx of PAF who is being seen today for the evaluation of chest pain and elevated troponin at the request of William Flash, RN.  History of Present Illness:   William [name] is a 83 y.o. with h/o parox atrial fibrillation on chronic anticoagulation with Eliquis. Followed by Dr William [name], last seen in 11/2015. The patient has Parkinson disease. No prior h/o CAD. Negative stress test in 07/2014. He went to a kickbox class for patients with parkinson disease today. After exercising for 45 minutes he developed palpitations, chest pressure and SOB as well as weakness. He came to the ER. He is currently chest pain free. He states that this is his first episode like this, denies prior chest pain, SOB, no LE edema, orthopnea, PND. Initial troponins are elevated.   Past Medical History:  Diagnosis Date  . A-fib ([place])   . Arthritis    "right knee" (08/09/2014)  . Basal cell carcinoma   . Benign prostatic hypertrophy with urinary obstruction   . Complication of anesthesia    "difficulty intubation, had to use fiberoptic 2006"  . Difficult intubation   . DJD (degenerative joint disease)   . Gout   . Hx of colonic polyps   . Hyperlipidemia   . Hypertension    sees [name]  . Hypothyroidism   . Kidney tumor    "tumor on cyst on kidney"   . 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 Mini stroke ([place])   . Parkinson disease ([place])   . Pneumonia    hx of in 1952  . Primary skin squamous cell carcinoma   . Subdural hematoma ([place]) 2006  . TIA (transient ischemic attack)    pt does not recall this hx "but may have had one today" (08/09/2014)    Past Surgical History:  Procedure Laterality Date    . APPENDECTOMY    . BACK SURGERY    . CATARACT EXTRACTION W/ INTRAOCULAR LENS  IMPLANT, BILATERAL Bilateral ~ 1998  . CHOLECYSTECTOMY    . CRYOABLATION  07-10-12   "tumor on cyst on my kidney; had an ablation"  . EYE SURGERY    . INGUINAL HERNIA REPAIR Right   . IR GENERIC HISTORICAL  11/09/2015   IR RADIOLOGIST EVAL & MGMT 11/09/2015 William Edouard, MD [facility] INTERV RAD  . IR RADIOLOGIST EVAL & MGMT  08/13/2016  . KIDNEY SURGERY    . KNEE ARTHROSCOPY Right   . LUMBAR LAMINECTOMY/DECOMPRESSION MICRODISCECTOMY  05/14/2012   Procedure: LUMBAR LAMINECTOMY/DECOMPRESSION MICRODISCECTOMY 1 LEVEL;  Surgeon: William Connors, MD;  Location: [place] [facility];  Service: Neurosurgery;  Laterality: Left;  Left Lumbar four-five Laminectomy/Diskectomy/Far lateral diskectomy  . SKIN CANCER EXCISION  "several"   "back of my neck; right eye; clavicle; nose"  . SUBDURAL HEMATOMA EVACUATION VIA CRANIOTOMY  2006  . THYROIDECTOMY     medullary carcinoma  . TONSILLECTOMY      Inpatient Medications:   Allergies:   No Known Allergies  Social History:   Social History   Social History  . Marital status: Widowed    Spouse name: N/A  . Number of children: 3  . Years of education: 15   Occupational History  .  retired    Social History Main Topics  . Smoking status: Former Smoker    Packs/day: 0.80    Years: 15.00    Types: Cigarettes    Quit date: 04/15/1965  . Smokeless tobacco: Never Used  . Alcohol use 3.0 oz/week    5 Glasses of wine per week     Comment: 1 glass of wine every night (6oz glass)  . Drug use: No  . Sexual activity: Yes   Other Topics Concern  . Not on file   Social History Narrative   Married.   Lives at home with significant other.   Retried. 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   Enjoys golfing, yard work, spending time with family.         Family History:    Family History  Problem Relation Age of Onset  . Stroke Mother   . Stroke Father   . Heart disease Father   .  Alzheimer's disease Brother   . Alzheimer's disease Sister      ROS:  Please see the history of present illness.  ROS  All other ROS reviewed and negative.     Physical Exam/Data:   Vitals:   12/03/16 2045 12/03/16 2100 12/03/16 2200 12/03/16 2215  BP: 134/73 140/80 (!) 154/85   Pulse: 60 81  (!) 58  Resp: (!) 23 16  16   Temp:      TempSrc:      SpO2: 95% 93% 97% 97%  Weight:      Height:       No intake or output data in the 24 hours ending 12/03/16 2239 Filed Weights   12/03/16 1730  Weight: 180 lb (81.6 kg)   Body mass index is 22.5 kg/m.  [name]:  Well nourished, well developed, in no acute distress HEENT: normal Lymph: no adenopathy Neck: no JVD Endocrine:  No thryomegaly Vascular: No carotid bruits; FA pulses 2+ bilaterally without bruits  Cardiac:  normal S1, S2; RRR; no murmur  Lungs:  clear to auscultation bilaterally, no wheezing, rhonchi or rales  Abd: soft, nontender, no hepatomegaly  Ext: no edema Musculoskeletal:  No deformities, BUE and BLE strength normal and equal Skin: warm and dry  Neuro:  CNs 2-12 intact, no focal abnormalities noted Psych:  Normal affect   EKG:  The EKG was personally reviewed and demonstrates:  SR, RBBB, unchanged from prior, personally reviewed  Relevant CV Studies:   Laboratory Data:  Chemistry  Recent Labs Lab 12/03/16 1850  NA 139  K 3.6  CL 101  CO2 28  GLUCOSE 97  BUN 22*  CREATININE 1.47*  CALCIUM 8.5*  GFRNONAA 41*  GFRAA 48*  ANIONGAP 10    No results for input(s): PROT, ALBUMIN, AST, ALT, ALKPHOS, BILITOT in the last 168 hours. Hematology  Recent Labs Lab 12/03/16 1850  WBC 6.7  RBC 5.11  HGB 15.3  HCT 47.1  MCV 92.2  MCH 29.9  MCHC 32.5  RDW 14.1  PLT 168   Cardiac EnzymesNo results for input(s): TROPONINI in the last 168 hours.   Recent Labs Lab 12/03/16 1900  TROPIPOC 1.66*    BNP  Recent Labs Lab 12/03/16 1905  BNP 67.8    DDimer No results for input(s): DDIMER in  the last 168 hours.  Radiology/Studies:  Dg Chest 2 View  Result Date: 12/03/2016 CLINICAL DATA:  History of AFib.  Palpitations. EXAM: CHEST  2 VIEW COMPARISON:  CT of the chest 11/09/2015 FINDINGS: Cardiomediastinal silhouette is  normal. Mediastinal contours appear intact. There is no evidence of pleural effusion or pneumothorax. No lobar consolidation. Ill-defined peribronchial airspace opacities in her the right costophrenic angle. Osseous structures are without acute abnormality. Soft tissues are grossly normal. IMPRESSION: No active cardiopulmonary disease. Ill-defined peribronchial airspace opacities likely correlate to previously demonstrated chronic airspace consolidation. Electronically Signed   By: Fidela Salisbury M.D.   On: 12/03/2016 19:30    Assessment and Plan:   1. NSTEMI  - Start Heparin drip, the patient took Eliquis at 6 pm, he wont be able to undergo a cath tomorrow, start Heparin drip in the am  - Continue to cycle troponin and ECGs  - start ASA 81 mg po daily, continue simvastatin, no BB as bradycardic at baseline  - left heart cath on Thursday 8/22  2. HTN - continue home losartan/HCTZ  3. HLP- on simvastatin  4. Parkinson disease - continue carbi-levodopa  Signed, Ena Dawley, MD  12/03/2016 10:39 PM

## 2016-12-03 NOTE — ED Notes (Signed)
Patient transported to X-ray 

## 2016-12-03 NOTE — H&P (Signed)
Cardiology Consultation:   Patient ID: William [name]; [id number]; [birth year]   Admit date: 12/03/2016 Date of Consult: 12/03/2016  Primary Care Provider: Pleas Koch, NP Primary Cardiologist: Dr Percival Spanish   Patient Profile:   William [name] is a 83 y.o. male with a hx of PAF who is being seen today for the evaluation of chest pain and elevated troponin at the request of William Flash, RN.  History of Present Illness:   [name] is a 83 y.o. with h/o parox atrial fibrillation on chronic anticoagulation with Eliquis. Followed by Dr Percival Spanish, last seen in 11/2015. The patient has Parkinson disease. No prior h/o CAD. Negative stress test in 07/2014. He went to a kickbox class for patients with parkinson disease today. After exercising for 45 minutes he developed palpitations, chest pressure and SOB as well as weakness. He came to the ER. He is currently chest pain free. He states that this is his first episode like this, denies prior chest pain, SOB, no LE edema, orthopnea, PND. Initial troponins are elevated.   Past Medical History:  Diagnosis Date  . A-fib ([place])   . Arthritis    "right knee" (08/09/2014)  . Basal cell carcinoma   . Benign prostatic hypertrophy with urinary obstruction   . Complication of anesthesia    "difficulty intubation, had to use fiberoptic 2006"  . Difficult intubation   . DJD (degenerative joint disease)   . Gout   . Hx of colonic polyps   . Hyperlipidemia   . Hypertension    sees [name]  . Hypothyroidism   . Kidney tumor    "tumor on cyst on kidney"   . 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 Mini stroke ([place])   . Parkinson disease ([place])   . Pneumonia    hx of in 1952  . Primary skin squamous cell carcinoma   . Subdural hematoma ([place]) 2006  . TIA (transient ischemic attack)    pt does not recall this hx "but may have had one today" (08/09/2014)    Past Surgical History:  Procedure  Laterality Date  . APPENDECTOMY    . BACK SURGERY    . CATARACT EXTRACTION W/ INTRAOCULAR LENS  IMPLANT, BILATERAL Bilateral ~ 1998  . CHOLECYSTECTOMY    . CRYOABLATION  07-10-12   "tumor on cyst on my kidney; had an ablation"  . EYE SURGERY    . INGUINAL HERNIA REPAIR Right   . IR GENERIC HISTORICAL  11/09/2015   IR RADIOLOGIST EVAL & MGMT 11/09/2015 [name], MD [facility] INTERV RAD  . IR RADIOLOGIST EVAL & MGMT  08/13/2016  . KIDNEY SURGERY    . KNEE ARTHROSCOPY Right   . LUMBAR LAMINECTOMY/DECOMPRESSION MICRODISCECTOMY  05/14/2012   Procedure: LUMBAR LAMINECTOMY/DECOMPRESSION MICRODISCECTOMY 1 LEVEL;  Surgeon: [name], MD;  Location: [facility];  Service: Neurosurgery;  Laterality: Left;  Left Lumbar four-five Laminectomy/Diskectomy/Far lateral diskectomy  . SKIN CANCER EXCISION  "several"   "back of my neck; right eye; clavicle; nose"  . SUBDURAL HEMATOMA EVACUATION VIA CRANIOTOMY  2006  . THYROIDECTOMY     medullary carcinoma  . TONSILLECTOMY      Inpatient Medications:   Allergies:   No Known Allergies  Social History:   Social History   Social History  . Marital status: Widowed    Spouse name: N/A  . Number of children: 3  . Years of education: 15   Occupational History  .  retired    Social History Main Topics  . Smoking status: Former Smoker    Packs/day: 0.80    Years: 15.00    Types: Cigarettes    Quit date: 04/15/1965  . Smokeless tobacco: Never Used  . Alcohol use 3.0 oz/week    5 Glasses of wine per week     Comment: 1 glass of wine every night (6oz glass)  . Drug use: No  . Sexual activity: Yes   Other Topics Concern  . Not on file   Social History Narrative   Married.   Lives at home with significant other.   Retried. 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   Enjoys golfing, yard work, spending time with family.         Family History:    Family History  Problem Relation Age of Onset  . Stroke Mother   . Stroke Father   . Heart  disease Father   . Alzheimer's disease Brother   . Alzheimer's disease Sister      ROS:  Please see the history of present illness.  ROS  All other ROS reviewed and negative.     Physical Exam/Data:   Vitals:   12/03/16 2045 12/03/16 2100 12/03/16 2200 12/03/16 2215  BP: 134/73 140/80 (!) 154/85   Pulse: 60 81  (!) 58  Resp: (!) 23 16  16   Temp:      TempSrc:      SpO2: 95% 93% 97% 97%  Weight:      Height:       No intake or output data in the 24 hours ending 12/03/16 2239 Filed Weights   12/03/16 1730  Weight: 180 lb (81.6 kg)   Body mass index is 22.5 kg/m.  General:  Well nourished, well developed, in no acute distress HEENT: normal Lymph: no adenopathy Neck: no JVD Endocrine:  No thryomegaly Vascular: No carotid bruits; FA pulses 2+ bilaterally without bruits  Cardiac:  normal S1, S2; RRR; no murmur  Lungs:  clear to auscultation bilaterally, no wheezing, rhonchi or rales  Abd: soft, nontender, no hepatomegaly  Ext: no edema Musculoskeletal:  No deformities, BUE and BLE strength normal and equal Skin: warm and dry  Neuro:  CNs 2-12 intact, no focal abnormalities noted Psych:  Normal affect   EKG:  The EKG was personally reviewed and demonstrates:  SR, RBBB, unchanged from prior, personally reviewed  Relevant CV Studies:   Laboratory Data:  Chemistry  Recent Labs Lab 12/03/16 1850  NA 139  K 3.6  CL 101  CO2 28  GLUCOSE 97  BUN 22*  CREATININE 1.47*  CALCIUM 8.5*  GFRNONAA 41*  GFRAA 48*  ANIONGAP 10    No results for input(s): PROT, ALBUMIN, AST, ALT, ALKPHOS, BILITOT in the last 168 hours. Hematology  Recent Labs Lab 12/03/16 1850  WBC 6.7  RBC 5.11  HGB 15.3  HCT 47.1  MCV 92.2  MCH 29.9  MCHC 32.5  RDW 14.1  PLT 168   Cardiac EnzymesNo results for input(s): TROPONINI in the last 168 hours.   Recent Labs Lab 12/03/16 1900  TROPIPOC 1.66*    BNP  Recent Labs Lab 12/03/16 1905  BNP 67.8    DDimer No results for  input(s): DDIMER in the last 168 hours.  Radiology/Studies:  Dg Chest 2 View  Result Date: 12/03/2016 CLINICAL DATA:  History of AFib.  Palpitations. EXAM: CHEST  2 VIEW COMPARISON:  CT of the chest 11/09/2015 FINDINGS: Cardiomediastinal silhouette is  normal. Mediastinal contours appear intact. There is no evidence of pleural effusion or pneumothorax. No lobar consolidation. Ill-defined peribronchial airspace opacities in her the right costophrenic angle. Osseous structures are without acute abnormality. Soft tissues are grossly normal. IMPRESSION: No active cardiopulmonary disease. Ill-defined peribronchial airspace opacities likely correlate to previously demonstrated chronic airspace consolidation. Electronically Signed   By: Fidela Salisbury M.D.   On: 12/03/2016 19:30    Assessment and Plan:   1. NSTEMI  - Start Heparin drip, the patient took Eliquis at 6 pm, he wont be able to undergo a cath tomorrow, start Heparin drip in the am  - Continue to cycle troponin and ECGs  - start ASA 81 mg po daily, continue simvastatin, no BB as bradycardic at baseline  - left heart cath on Thursday 8/22  2. HTN - continue home losartan/HCTZ  3. HLP- on simvastatin  4. Parkinson disease - continue carbi-levodopa  Signed, Ena Dawley, MD  12/03/2016 10:39 PM

## 2016-12-03 NOTE — ED Triage Notes (Signed)
Per EMS, pt at a boxing class and began feeling palpitations, hx a-fib, denies chest pain/shortness of breath. Hx parkinson's.

## 2016-12-03 NOTE — Progress Notes (Signed)
ANTICOAGULATION CONSULT NOTE - Initial Consult  Pharmacy Consult for Heparin Indication: chest pain/ACS  No Known Allergies  Patient Measurements: Height: 6\' 3"  (190.5 cm) Weight: 180 lb (81.6 kg) IBW/kg (Calculated) : 84.5  Vital Signs: Temp: 98.1 F (36.7 C) (08/21 1729) Temp Source: Oral (08/21 1729) BP: 154/99 (08/21 1800) Pulse Rate: 73 (08/21 1800)  Labs:  Recent Labs  12/03/16 1850  HGB 15.3  HCT 47.1  PLT 168  CREATININE 1.47*    Estimated Creatinine Clearance: 41.6 mL/min (A) (by C-G formula based on SCr of 1.47 mg/dL (H)).   Medical History: Past Medical History:  Diagnosis Date  . A-fib ([place])   . Arthritis    "right knee" (08/09/2014)  . Basal cell carcinoma   . Benign prostatic hypertrophy with urinary obstruction   . Complication of anesthesia    "difficulty intubation, had to use fiberoptic 2006"  . Difficult intubation   . DJD (degenerative joint disease)   . Gout   . Hx of colonic polyps   . Hyperlipidemia   . Hypertension    sees [name]  . Hypothyroidism   . Kidney tumor    "tumor on cyst on kidney"   . 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 Mini stroke ([place])   . Parkinson disease ([place])   . Pneumonia    hx of in 1952  . Primary skin squamous cell carcinoma   . Subdural hematoma ([place]) 2006  . TIA (transient ischemic attack)    pt does not recall this hx "but may have had one today" (08/09/2014)    Medications:   (Not in a hospital admission)  Assessment: 25 YOM here with shortness of breath. Pharmacy consulted to start IV heparin for ACS. Initial troponins are elevated. H/H and Plt wnl. Patient is on apixaban at home for h/o Afib. He took his last dose this evening at 1800  Goal of Therapy:  Heparin level 0.3-0.7 units/ml Monitor platelets by anticoagulation protocol: Yes   Plan:  -Hold heparin till tomorrow AM as patient took Eliquis dose this evening  -Monitor daily HL, CBC and s/s of  bleeding  Albertina Parr, PharmD., BCPS Clinical Pharmacist Pager [phone]

## 2016-12-04 ENCOUNTER — Inpatient Hospital Stay (HOSPITAL_COMMUNITY): Payer: Medicare HMO

## 2016-12-04 ENCOUNTER — Encounter (HOSPITAL_COMMUNITY): Payer: Self-pay | Admitting: Physician Assistant

## 2016-12-04 DIAGNOSIS — Z7901 Long term (current) use of anticoagulants: Secondary | ICD-10-CM

## 2016-12-04 DIAGNOSIS — E7800 Pure hypercholesterolemia, unspecified: Secondary | ICD-10-CM

## 2016-12-04 DIAGNOSIS — I351 Nonrheumatic aortic (valve) insufficiency: Secondary | ICD-10-CM

## 2016-12-04 DIAGNOSIS — N183 Chronic kidney disease, stage 3 (moderate): Secondary | ICD-10-CM

## 2016-12-04 HISTORY — DX: Long term (current) use of anticoagulants: Z79.01

## 2016-12-04 LAB — ECHOCARDIOGRAM COMPLETE
Height: 75 in
WEIGHTICAEL: 2884.8 [oz_av]

## 2016-12-04 LAB — APTT: aPTT: 99 seconds — ABNORMAL HIGH (ref 24–36)

## 2016-12-04 LAB — HEPARIN LEVEL (UNFRACTIONATED): Heparin Unfractionated: 1.1 IU/mL — ABNORMAL HIGH (ref 0.30–0.70)

## 2016-12-04 MED ORDER — ATORVASTATIN CALCIUM 80 MG PO TABS
80.0000 mg | ORAL_TABLET | Freq: Every day | ORAL | Status: DC
  Administered 2016-12-04 – 2016-12-05 (×2): 80 mg via ORAL
  Filled 2016-12-04 (×2): qty 1

## 2016-12-04 MED ORDER — RASAGILINE MESYLATE 1 MG PO TABS
1.0000 mg | ORAL_TABLET | Freq: Every day | ORAL | Status: DC
  Administered 2016-12-04 – 2016-12-05 (×2): 1 mg via ORAL
  Filled 2016-12-04 (×2): qty 1

## 2016-12-04 MED ORDER — SODIUM CHLORIDE 0.9 % WEIGHT BASED INFUSION
1.0000 mL/kg/h | INTRAVENOUS | Status: DC
  Administered 2016-12-04: 1 mL/kg/h via INTRAVENOUS

## 2016-12-04 MED ORDER — HEPARIN (PORCINE) IN NACL 100-0.45 UNIT/ML-% IJ SOLN
1000.0000 [IU]/h | INTRAMUSCULAR | Status: DC
  Administered 2016-12-05: 1150 [IU]/h via INTRAVENOUS
  Filled 2016-12-04: qty 250

## 2016-12-04 MED ORDER — LEVOTHYROXINE SODIUM 100 MCG PO TABS
100.0000 ug | ORAL_TABLET | Freq: Every day | ORAL | Status: DC
  Administered 2016-12-04 – 2016-12-05 (×2): 100 ug via ORAL
  Filled 2016-12-04 (×2): qty 1

## 2016-12-04 MED ORDER — HYDRALAZINE HCL 50 MG PO TABS
50.0000 mg | ORAL_TABLET | Freq: Three times a day (TID) | ORAL | Status: DC
  Filled 2016-12-04 (×2): qty 1

## 2016-12-04 MED ORDER — ADULT MULTIVITAMIN W/MINERALS CH
1.0000 | ORAL_TABLET | Freq: Every day | ORAL | Status: DC
  Administered 2016-12-04 – 2016-12-05 (×2): 1 via ORAL
  Filled 2016-12-04 (×2): qty 1

## 2016-12-04 MED ORDER — SODIUM CHLORIDE 0.9% FLUSH: 3.0000 mL | Freq: Two times a day (BID) | INTRAVENOUS | Status: DC

## 2016-12-04 MED ORDER — SODIUM CHLORIDE 0.9% FLUSH: 3.0000 mL | INTRAVENOUS | Status: DC | PRN

## 2016-12-04 MED ORDER — HEPARIN (PORCINE) IN NACL 100-0.45 UNIT/ML-% IJ SOLN
1200.0000 [IU]/h | INTRAMUSCULAR | Status: DC
  Administered 2016-12-04: 1200 [IU]/h via INTRAVENOUS
  Filled 2016-12-04: qty 250

## 2016-12-04 MED ORDER — CARBIDOPA-LEVODOPA 25-100 MG PO TABS
1.5000 | ORAL_TABLET | Freq: Four times a day (QID) | ORAL | Status: DC
  Administered 2016-12-04 – 2016-12-05 (×6): 1.5 via ORAL
  Filled 2016-12-04 (×7): qty 2

## 2016-12-04 MED ORDER — ASPIRIN 81 MG PO CHEW
81.0000 mg | CHEWABLE_TABLET | ORAL | Status: AC
  Administered 2016-12-05: 81 mg via ORAL
  Filled 2016-12-04: qty 1

## 2016-12-04 MED ORDER — ALIGN 4 MG PO CAPS: 1.0000 | ORAL_CAPSULE | Freq: Every day | ORAL | Status: DC

## 2016-12-04 MED ORDER — SACCHAROMYCES BOULARDII 250 MG PO CAPS
250.0000 mg | ORAL_CAPSULE | Freq: Every day | ORAL | Status: DC
  Administered 2016-12-04 – 2016-12-05 (×2): 250 mg via ORAL
  Filled 2016-12-04 (×2): qty 1

## 2016-12-04 MED ORDER — SODIUM CHLORIDE 0.9 % IV SOLN: 250.0000 mL | INTRAVENOUS | Status: DC | PRN

## 2016-12-04 NOTE — Consult Note (Signed)
[facility] CM Primary Care Navigator  12/04/2016  William [name] September 17, 1928 [id number]   Went in to see patient at the bedside to identify possible discharge needs but staff is in the room doing a procedure (2D Echo) at the moment.  Will attempt to meet with patient againat another time when available in the room.      Addendum:  Went back to see patient at the bedside after a procedure to identify possible discharge needs and met with wife William [name]) and his two sons. Patient reports he felt "heart racing", weakness and light headedness that led to this admission. Patientendorses Alma Friendly, NP with [employer] [facility] his primary care provider.   Patient shared using [facility] Order service, [facility] and [name] to [facility] without difficulty.   Patient ismanaging his medicationswith wife's assistance using "pill box" system filled every 2 weeks.   Patient reports driving prior to admission but wife will be providing transportation to hisdoctors'appointments after discharge as stated.  Patient's wife is hisprimary caregiver at home.  Anticipated discharge plan is home according to wife.  Patient and family voiced understanding to callprimary care provider's officewhen hegets home,to schedulea post discharge follow-upwithin a week or sooner if needed. Patient letter (with PCP's contact number) was provided as a reminder.  Explained to patient and family regarding[facility] CM services available for health management at home but denied any current needs or concerns at this time.  Patient states EMMI Calls offeredfor follow-up at home is not needed since wife is assisting with care, monitoring and managing health issues at home.   Patient and wife statesunderstandingto seek referral from primary care provider to [facility] care management if deemed necessaryfor services in the  future.  [facility] care management information provided for future needs that may arise.    For questions, please contact:  Dannielle Huh, BSN, RN- [facility] Primary Care Navigator  Telephone: [phone] [place]

## 2016-12-04 NOTE — Care Management Note (Signed)
Case Management Note  Patient Details  Name: William [name] MRN: [id number] Date of Birth: 1928-08-13  Subjective/Objective:                 Independent patient from home with spouse admitted w with h/o parox atrial fibrillation on chronic anticoagulation with Eliquis. Followed by Dr Percival Spanish, last seen in 11/2015. The patient has Parkinson disease. No prior h/o CAD. Negative stress test in 07/2014. He went to a kickbox class for patients with parkinson disease today. After exercising for 45 minutes he developed palpitations, chest pressure and SOB as well as weakness. He came to the ER. He is currently chest pain free. He states that this is his first episode like this, denies prior chest pain, SOB, no LE edema, orthopnea, PND. Initial troponins are elevated.   Action/Plan:  CM will continue to follow for DC planning.  Expected Discharge Date:                  Expected Discharge Plan:  Home/Self Care  In-House Referral:     Discharge planning Services  CM Consult  Post Acute Care Choice:    Choice offered to:     DME Arranged:    DME Agency:     HH Arranged:    HH Agency:     Status of Service:  In process, will continue to follow  If discussed at Long Length of Stay Meetings, dates discussed:    Additional Comments:  Carles Collet, RN 12/04/2016, 3:29 PM

## 2016-12-04 NOTE — Progress Notes (Signed)
  Echocardiogram 2D Echocardiogram has been performed.  Bobbye Charleston 12/04/2016, 3:26 PM

## 2016-12-04 NOTE — Progress Notes (Signed)
Progress Note  Patient Name: William [name] Date of Encounter: 12/04/2016  Primary Cardiologist: Dr Percival Spanish  Subjective   No CP, no SOB overnight, no ?'s about cath.   Inpatient Medications    Scheduled Meds: . aspirin EC  81 mg Oral Daily  . carbidopa-levodopa  1 tablet Oral TID  . hydrALAZINE  25 mg Oral TID  . levothyroxine  100 mcg Oral QAC breakfast  . losartan  100 mg Oral Daily  . rasagiline  1 mg Oral Daily  . simvastatin  20 mg Oral q1800   Continuous Infusions: . heparin 1,200 Units/hr (12/04/16 0600)   PRN Meds: acetaminophen, nitroGLYCERIN, ondansetron (ZOFRAN) IV   Vital Signs    Vitals:   12/03/16 2300 12/03/16 2330 12/04/16 0503 12/04/16 0644  BP: (!) 148/85 (!) 154/92 (!) 157/99 (!) 156/90  Pulse: (!) 54 (!) 53  65  Resp: 13 12    Temp:    98.6 F (37 C)  TempSrc:      SpO2: 96% 97%  94%  Weight:    180 lb 4.8 oz (81.8 kg)  Height:        Intake/Output Summary (Last 24 hours) at 12/04/16 0743 Last data filed at 12/04/16 0700  Gross per 24 hour  Intake               27 ml  Output              550 ml  Net             -523 ml   Filed Weights   12/03/16 1730 12/04/16 0644  Weight: 180 lb (81.6 kg) 180 lb 4.8 oz (81.8 kg)    Telemetry    SR, sinus brady to high 40s, RBBB, PVCs and rare pairs - Personally Reviewed  ECG    SR, RBBB, ?LAE - Personally Reviewed  Physical Exam   GEN: No acute distress.   Neck: No JVD Cardiac: RRR, soft murmur, no rubs, or gallops.  Respiratory: Clear to auscultation bilaterally. GI: Soft, nontender, non-distended  MS: No edema; No deformity. Neuro:  Nonfocal  Psych: Normal affect   Labs    Chemistry  Recent Labs Lab 12/03/16 1850  NA 139  K 3.6  CL 101  CO2 28  GLUCOSE 97  BUN 22*  CREATININE 1.47*  CALCIUM 8.5*  GFRNONAA 41*  GFRAA 48*  ANIONGAP 10     Hematology  Recent Labs Lab 12/03/16 1850  WBC 6.7  RBC 5.11  HGB 15.3  HCT 47.1  MCV 92.2  MCH 29.9  MCHC 32.5    RDW 14.1  PLT 168    Cardiac Enzymes  Recent Labs Lab 12/03/16 2220  TROPONINI 8.33*     Recent Labs Lab 12/03/16 1900  TROPIPOC 1.66*     BNP  Recent Labs Lab 12/03/16 1905  BNP 67.8     Radiology    Dg Chest 2 View  Result Date: 12/03/2016 CLINICAL DATA:  History of AFib.  Palpitations. EXAM: CHEST  2 VIEW COMPARISON:  CT of the chest 11/09/2015 FINDINGS: Cardiomediastinal silhouette is normal. Mediastinal contours appear intact. There is no evidence of pleural effusion or pneumothorax. No lobar consolidation. Ill-defined peribronchial airspace opacities in her the right costophrenic angle. Osseous structures are without acute abnormality. Soft tissues are grossly normal. IMPRESSION: No active cardiopulmonary disease. Ill-defined peribronchial airspace opacities likely correlate to previously demonstrated chronic airspace consolidation. Electronically Signed   By: Linwood Dibbles.D.  On: 12/03/2016 19:30    Cardiac Studies   Cath 08/23, will ck echo  Patient Profile     83 y.o. male w/ hx PAF on Eliquis, HTN, HLD, Parkinson's, thyroid CA, TIA, SDH 2006, who was admitted 08/21 for CP, elevated troponin. Cath planned 08/23, after Eliquis washout.  Assessment & Plan    Principal Problem: 1.   NSTEMI (non-ST elevated myocardial infarction) ([place]) - cath once Eliquis washout completed, tomorrow planned - no more CP/SOB - on ASA, change to high-dose statin, no BB due to bradycardia, on ARB - ck echo to minimize dye use and get EF - pt on board 1st case, orders written  Active Problems: 2.  Essential hypertension - BP not well-controlled, on home rx except HCTZ 12.5 mg qd - will increase hydralazine to 50 mg tid  3.   Chronic kidney disease - recheck BMET in am - start pre-cath hydration tonight  4.  PAF (paroxysmal atrial fibrillation) (HCC) - maintaining SR   5.  HLD (hyperlipidemia) - ck lipids in am  6.  Chronic anticoagulation - Eliquis on  hold, on heparin - CHADS2VASC= 6 (CAD, HTN, CVA x 2, age x 2)   Signed, Lenoard Aden  12/04/2016, 7:43 AM

## 2016-12-04 NOTE — Progress Notes (Signed)
ANTICOAGULATION CONSULT NOTE - FOLLOW UP    HL = 1.1 (goal 0.3 - 0.7 units/mL) APTT = 99 (goal 66 - 102 sec) Heparin dosing weight = 82 kg   Assessment: 88 YOM with history of Afib on Eliquis PTA, last dose on 12/03/16 at 1830.  Patient was transitioned to IV heparin for ACS with plan for cath tomorrow.  Heparin level is elevated due to Eliquis' effect.  Currently using aPTT to assist with heparin dosing.  APTT is therapeutic and toward the high end of normal. No bleeding reported.   Plan: - Reduce heparin gtt slightly to 1150 units/hr - F/U AM labs    Lyndzie Zentz D. Mina Marble, PharmD, BCPS 12/04/2016, 4:59 PM

## 2016-12-05 ENCOUNTER — Telehealth: Payer: Self-pay | Admitting: Cardiology

## 2016-12-05 ENCOUNTER — Encounter (HOSPITAL_COMMUNITY)
Admission: EM | Disposition: A | Discharge status: Home / Self Care | Payer: Self-pay | Source: Home / Self Care | Attending: Cardiology

## 2016-12-05 ENCOUNTER — Encounter (HOSPITAL_COMMUNITY): Payer: Self-pay | Admitting: Cardiology

## 2016-12-05 DIAGNOSIS — I2510 Atherosclerotic heart disease of native coronary artery without angina pectoris: Secondary | ICD-10-CM

## 2016-12-05 DIAGNOSIS — I421 Obstructive hypertrophic cardiomyopathy: Secondary | ICD-10-CM

## 2016-12-05 DIAGNOSIS — G903 Multi-system degeneration of the autonomic nervous system: Secondary | ICD-10-CM

## 2016-12-05 HISTORY — PX: LEFT HEART CATH AND CORONARY ANGIOGRAPHY: CATH118249

## 2016-12-05 LAB — APTT: aPTT: 110 seconds — ABNORMAL HIGH (ref 24–36)

## 2016-12-05 LAB — COMPREHENSIVE METABOLIC PANEL
ALT: 5 U/L — ABNORMAL LOW (ref 17–63)
AST: 34 U/L (ref 15–41)
Albumin: 3.8 g/dL (ref 3.5–5.0)
Alkaline Phosphatase: 51 U/L (ref 38–126)
Anion gap: 7 (ref 5–15)
BUN: 21 mg/dL — ABNORMAL HIGH (ref 6–20)
CO2: 31 mmol/L (ref 22–32)
Calcium: 8.4 mg/dL — ABNORMAL LOW (ref 8.9–10.3)
Chloride: 101 mmol/L (ref 101–111)
Creatinine, Ser: 1.47 mg/dL — ABNORMAL HIGH (ref 0.61–1.24)
GFR calc Af Amer: 48 mL/min — ABNORMAL LOW (ref >60)
GFR calc non Af Amer: 41 mL/min — ABNORMAL LOW (ref >60)
Glucose, Bld: 95 mg/dL (ref 65–99)
Potassium: 3.6 mmol/L (ref 3.5–5.1)
Sodium: 139 mmol/L (ref 135–145)
Total Bilirubin: 0.9 mg/dL (ref 0.3–1.2)
Total Protein: 6.2 g/dL — ABNORMAL LOW (ref 6.5–8.1)

## 2016-12-05 LAB — CBC
HCT: 43.7 % (ref 39.0–52.0)
Hemoglobin: 13.5 g/dL (ref 13.0–17.0)
MCH: 28.9 pg (ref 26.0–34.0)
MCHC: 30.9 g/dL (ref 30.0–36.0)
MCV: 93.6 fL (ref 78.0–100.0)
Platelets: 142 10*3/uL — ABNORMAL LOW (ref 150–400)
RBC: 4.67 MIL/uL (ref 4.22–5.81)
RDW: 15 % (ref 11.5–15.5)
WBC: 5.7 10*3/uL (ref 4.0–10.5)

## 2016-12-05 LAB — HEPARIN LEVEL (UNFRACTIONATED): Heparin Unfractionated: 0.96 IU/mL — ABNORMAL HIGH (ref 0.30–0.70)

## 2016-12-05 LAB — LIPID PANEL
Cholesterol: 116 mg/dL (ref 0–200)
HDL: 46 mg/dL (ref >40)
LDL Cholesterol: 42 mg/dL (ref 0–99)
Total CHOL/HDL Ratio: 2.5 RATIO
Triglycerides: 139 mg/dL (ref <150)
VLDL: 28 mg/dL (ref 0–40)

## 2016-12-05 SURGERY — LEFT HEART CATH AND CORONARY ANGIOGRAPHY
Anesthesia: LOCAL

## 2016-12-05 MED ORDER — LIDOCAINE HCL (PF) 1 % IJ SOLN
INTRAMUSCULAR | Status: AC
  Filled 2016-12-05: qty 30

## 2016-12-05 MED ORDER — ALPRAZOLAM 0.25 MG PO TABS
0.2500 mg | ORAL_TABLET | Freq: Once | ORAL | Status: AC
  Administered 2016-12-05: 0.25 mg via ORAL
  Filled 2016-12-05: qty 1

## 2016-12-05 MED ORDER — IOPAMIDOL (ISOVUE-370) INJECTION 76%
INTRAVENOUS | Status: DC | PRN
  Administered 2016-12-05: 55 mL via INTRA_ARTERIAL

## 2016-12-05 MED ORDER — HEPARIN (PORCINE) IN NACL 2-0.9 UNIT/ML-% IJ SOLN
INTRAMUSCULAR | Status: AC
  Filled 2016-12-05: qty 500

## 2016-12-05 MED ORDER — APIXABAN 2.5 MG PO TABS: 2.5000 mg | ORAL_TABLET | Freq: Two times a day (BID) | ORAL | Status: DC

## 2016-12-05 MED ORDER — SODIUM CHLORIDE 0.9 % WEIGHT BASED INFUSION
1.0000 mL/kg/h | INTRAVENOUS | Status: AC
  Administered 2016-12-05: 1 mL/kg/h via INTRAVENOUS

## 2016-12-05 MED ORDER — ATORVASTATIN CALCIUM 40 MG PO TABS: 80.0000 mg | ORAL_TABLET | Freq: Every day | ORAL | 12 refills | Status: DC

## 2016-12-05 MED ORDER — HYDRALAZINE HCL 20 MG/ML IJ SOLN
10.0000 mg | Freq: Once | INTRAMUSCULAR | Status: AC
  Administered 2016-12-05: 10 mg via INTRAVENOUS
  Filled 2016-12-05: qty 1

## 2016-12-05 MED ORDER — LIDOCAINE HCL (PF) 1 % IJ SOLN
INTRAMUSCULAR | Status: DC | PRN
  Administered 2016-12-05: 15 mL
  Administered 2016-12-05: 2 mL

## 2016-12-05 MED ORDER — HYDRALAZINE HCL 25 MG PO TABS: 25.0000 mg | ORAL_TABLET | Freq: Three times a day (TID) | ORAL | 3 refills | Status: DC | PRN

## 2016-12-05 MED ORDER — IOPAMIDOL (ISOVUE-370) INJECTION 76%
INTRAVENOUS | Status: AC
  Filled 2016-12-05: qty 100

## 2016-12-05 MED ORDER — SODIUM CHLORIDE 0.9 % IV SOLN: 250.0000 mL | INTRAVENOUS | Status: DC | PRN

## 2016-12-05 MED ORDER — MIDAZOLAM HCL 2 MG/2ML IJ SOLN
INTRAMUSCULAR | Status: AC
  Filled 2016-12-05: qty 2

## 2016-12-05 MED ORDER — HEPARIN (PORCINE) IN NACL 2-0.9 UNIT/ML-% IJ SOLN
INTRAMUSCULAR | Status: AC | PRN
  Administered 2016-12-05: 1000 mL

## 2016-12-05 MED ORDER — VERAPAMIL HCL 2.5 MG/ML IV SOLN
INTRAVENOUS | Status: AC
  Filled 2016-12-05: qty 2

## 2016-12-05 MED ORDER — NITROGLYCERIN 1 MG/10 ML FOR IR/CATH LAB
INTRA_ARTERIAL | Status: DC | PRN
  Administered 2016-12-05: 200 ug via INTRA_ARTERIAL

## 2016-12-05 MED ORDER — AMLODIPINE BESYLATE 2.5 MG PO TABS
2.5000 mg | ORAL_TABLET | Freq: Every day | ORAL | Status: DC
  Administered 2016-12-05: 2.5 mg via ORAL
  Filled 2016-12-05: qty 1

## 2016-12-05 MED ORDER — HEPARIN SODIUM (PORCINE) 5000 UNIT/ML IJ SOLN: 5000.0000 [IU] | Freq: Three times a day (TID) | INTRAMUSCULAR | Status: DC

## 2016-12-05 MED ORDER — NITROGLYCERIN 1 MG/10 ML FOR IR/CATH LAB
INTRA_ARTERIAL | Status: AC
  Filled 2016-12-05: qty 10

## 2016-12-05 MED ORDER — HEPARIN SODIUM (PORCINE) 1000 UNIT/ML IJ SOLN
INTRAMUSCULAR | Status: AC
  Filled 2016-12-05: qty 1

## 2016-12-05 MED ORDER — SODIUM CHLORIDE 0.9% FLUSH: 3.0000 mL | INTRAVENOUS | Status: DC | PRN

## 2016-12-05 MED ORDER — LOSARTAN POTASSIUM 100 MG PO TABS: 100.0000 mg | ORAL_TABLET | Freq: Every day | ORAL | 11 refills | Status: DC

## 2016-12-05 MED ORDER — NITROGLYCERIN 0.4 MG SL SUBL: 0.4000 mg | SUBLINGUAL_TABLET | SUBLINGUAL | 12 refills | Status: DC | PRN

## 2016-12-05 MED ORDER — AMLODIPINE BESYLATE 2.5 MG PO TABS: 2.5000 mg | ORAL_TABLET | Freq: Every day | ORAL | 6 refills | Status: DC

## 2016-12-05 MED ORDER — MIDAZOLAM HCL 2 MG/2ML IJ SOLN
INTRAMUSCULAR | Status: DC | PRN
  Administered 2016-12-05: 1 mg via INTRAVENOUS

## 2016-12-05 MED ORDER — VERAPAMIL HCL 2.5 MG/ML IV SOLN
INTRAVENOUS | Status: DC | PRN
  Administered 2016-12-05: 10 mL via INTRA_ARTERIAL

## 2016-12-05 MED ORDER — SODIUM CHLORIDE 0.9% FLUSH: 3.0000 mL | Freq: Two times a day (BID) | INTRAVENOUS | Status: DC

## 2016-12-05 SURGICAL SUPPLY — 12 items
CATH IMPULSE 5F ANG/FL3.5 (CATHETERS) ×1 IMPLANT
DEVICE RAD COMP TR BAND LRG (VASCULAR PRODUCTS) ×1 IMPLANT
GLIDESHEATH SLEND SS 6F .021 (SHEATH) ×1 IMPLANT
GUIDEWIRE INQWIRE 1.5J.035X260 (WIRE) IMPLANT
INQWIRE 1.5J .035X260CM (WIRE) ×2
KIT HEART LEFT (KITS) ×2 IMPLANT
PACK CARDIAC CATHETERIZATION (CUSTOM PROCEDURE TRAY) ×2 IMPLANT
SHEATH PINNACLE 5F 10CM (SHEATH) ×1 IMPLANT
TRANSDUCER W/STOPCOCK (MISCELLANEOUS) ×2 IMPLANT
TUBING CIL FLEX 10 FLL-RA (TUBING) ×2 IMPLANT
WIRE EMERALD 3MM-J .035X150CM (WIRE) ×1 IMPLANT
WIRE HI TORQ VERSACORE-J 145CM (WIRE) ×1 IMPLANT

## 2016-12-05 NOTE — Progress Notes (Signed)
Spoke to [employer], he stated he would bring up abdominal binder.  A11 sent for TEDs.

## 2016-12-05 NOTE — Discharge Instructions (Signed)
PLEASE REMEMBER TO BRING ALL OF YOUR MEDICATIONS TO EACH OF YOUR FOLLOW-UP OFFICE VISITS.  PLEASE ATTEND ALL SCHEDULED FOLLOW-UP APPOINTMENTS.   Activity: Increase activity slowly as tolerated. You may shower, but no soaking baths (or swimming) for 1 week. No driving for 2 days. No lifting over 5 lbs for 1 week. No sexual activity for 1 week.   You May Return to Work: in 1 week (if applicable)  Wound Care: You may wash cath site gently with soap and water. Keep cath site clean and dry. If you notice pain, swelling, bleeding or pus at your cath site, please call [phone].    Cardiac Cath Site Care Refer to this sheet in the next few weeks. These instructions provide you with information on caring for yourself after your procedure. Your caregiver may also give you more specific instructions. Your treatment has been planned according to current medical practices, but problems sometimes occur. Call your caregiver if you have any problems or questions after your procedure. HOME CARE INSTRUCTIONS  You may shower 24 hours after the procedure. Remove the bandage (dressing) and gently wash the site with plain soap and water. Gently pat the site dry.   Do not apply powder or lotion to the site.   Do not sit in a bathtub, swimming pool, or whirlpool for 5 to 7 days.   No bending, squatting, or lifting anything over 10 pounds (4.5 kg) as directed by your caregiver.   Inspect the site at least twice daily.   Do not drive home if you are discharged the same day of the procedure. Have someone else drive you.   You may drive 24 hours after the procedure unless otherwise instructed by your caregiver.  What to expect:  Any bruising will usually fade within 1 to 2 weeks.   Blood that collects in the tissue (hematoma) may be painful to the touch. It should usually decrease in size and tenderness within 1 to 2 weeks.  SEEK IMMEDIATE MEDICAL CARE IF:  You have unusual pain at the site or down the  affected limb.   You have redness, warmth, swelling, or pain at the site.   You have drainage (other than a small amount of blood on the dressing).   You have chills.   You have a fever or persistent symptoms for more than 72 hours.   You have a fever and your symptoms suddenly get worse.   Your leg becomes pale, cool, tingly, or numb.   You have heavy bleeding from the site. Hold pressure on the site.  Document Released: 05/04/2010 Document Revised: 03/21/2011 Document Reviewed: 05/04/2010 [facility] Patient Information 2012 [place].   Heart-Healthy Eating Plan Heart-healthy meal planning includes:  Limiting unhealthy fats.  Increasing healthy fats.  Making other small dietary changes.  You may need to talk with your doctor or a diet specialist (dietitian) to create an eating plan that is right for you. What types of fat should I choose?  Choose healthy fats. These include olive oil and canola oil, flaxseeds, walnuts, almonds, and seeds.  Eat more omega-3 fats. These include salmon, mackerel, sardines, tuna, flaxseed oil, and ground flaxseeds. Try to eat fish at least twice each week.  Limit saturated fats. ? Saturated fats are often found in animal products, such as meats, butter, and cream. ? Plant sources of saturated fats include palm oil, palm kernel oil, and coconut oil.  Avoid foods with partially hydrogenated oils in them. These include stick margarine, some tub  margarines, cookies, crackers, and other baked goods. These contain trans fats. What general guidelines do I need to follow?  Check food labels carefully. Identify foods with trans fats or high amounts of saturated fat.  Fill one half of your plate with vegetables and green salads. Eat 4-5 servings of vegetables per day. A serving of vegetables is: ? 1 cup of raw leafy vegetables. ?  cup of raw or cooked cut-up vegetables. ?  cup of vegetable juice.  Fill one fourth of your plate with whole  grains. Look for the word "whole" as the first word in the ingredient list.  Fill one fourth of your plate with lean protein foods.  Eat 4-5 servings of fruit per day. A serving of fruit is: ? One medium whole fruit. ?  cup of dried fruit. ?  cup of fresh, frozen, or canned fruit. ?  cup of 100% fruit juice.  Eat more foods that contain soluble fiber. These include apples, broccoli, carrots, beans, peas, and barley. Try to get 20-30 g of fiber per day.  Eat more home-cooked food. Eat less restaurant, buffet, and fast food.  Limit or avoid alcohol.  Limit foods high in starch and sugar.  Avoid fried foods.  Avoid frying your food. Try baking, boiling, grilling, or broiling it instead. You can also reduce fat by: ? Removing the skin from poultry. ? Removing all visible fats from meats. ? Skimming the fat off of stews, soups, and gravies before serving them. ? Steaming vegetables in water or broth.  Lose weight if you are overweight.  Eat 4-5 servings of nuts, legumes, and seeds per week: ? One serving of dried beans or legumes equals  cup after being cooked. ? One serving of nuts equals 1 ounces. ? One serving of seeds equals  ounce or one tablespoon.  You may need to keep track of how much salt or sodium you eat. This is especially true if you have high blood pressure. Talk with your doctor or dietitian to get more information. What foods can I eat? Grains Breads, including [place], white, pita, wheat, raisin, rye, oatmeal, and [place]. Tortillas that are neither fried nor made with lard or trans fat. Low-fat rolls, including hotdog and hamburger buns and English muffins. Biscuits. Muffins. Waffles. Pancakes. Light popcorn. Whole-grain cereals. Flatbread. Melba toast. Pretzels. Breadsticks. Rusks. Low-fat snacks. Low-fat crackers, including oyster, saltine, matzo, graham, animal, and rye. Rice and pasta, including brown rice and pastas that are made with whole  wheat. Vegetables All vegetables. Fruits All fruits, but limit coconut. Meats and Other Protein Sources Lean, well-trimmed beef, veal, pork, and lamb. Chicken and [place] without skin. All fish and shellfish. Wild duck, rabbit, pheasant, and venison. Egg whites or low-cholesterol egg substitutes. Dried beans, peas, lentils, and tofu. Seeds and most nuts. Dairy Low-fat or nonfat cheeses, including ricotta, string, and mozzarella. Skim or 1% milk that is liquid, powdered, or evaporated. Buttermilk that is made with low-fat milk. Nonfat or low-fat yogurt. Beverages Mineral water. Diet carbonated beverages. Sweets and Desserts Sherbets and fruit ices. Honey, jam, marmalade, jelly, and syrups. Meringues and gelatins. Pure sugar candy, such as hard candy, jelly beans, gumdrops, mints, marshmallows, and small amounts of dark chocolate. [employer] Eat all sweets and desserts in moderation. Fats and Oils Nonhydrogenated (trans-free) margarines. Vegetable oils, including soybean, sesame, sunflower, olive, peanut, safflower, corn, canola, and cottonseed. Salad dressings or mayonnaise made with a vegetable oil. Limit added fats and oils that you use for cooking,  baking, salads, and as spreads. Other Cocoa powder. Coffee and tea. All seasonings and condiments. The items listed above may not be a complete list of recommended foods or beverages. Contact your dietitian for more options. What foods are not recommended? Grains Breads that are made with saturated or trans fats, oils, or whole milk. Croissants. Butter rolls. Cheese breads. Sweet rolls. Donuts. Buttered popcorn. Chow mein noodles. High-fat crackers, such as cheese or butter crackers. Meats and Other Protein Sources Fatty meats, such as hotdogs, short ribs, sausage, spareribs, bacon, rib eye roast or steak, and mutton. High-fat deli meats, such as salami and bologna. Caviar. Domestic duck and goose. Organ meats, such as kidney, liver,  sweetbreads, and heart. Dairy Cream, sour cream, cream cheese, and creamed cottage cheese. Whole-milk cheeses, including blue (bleu), Monte[place] Jack, [place], [place], [place], [place], Swiss, cheddar, [place], and [place]. Whole or 2% milk that is liquid, evaporated, or condensed. Whole buttermilk. Cream sauce or high-fat cheese sauce. Yogurt that is made from whole milk. Beverages Regular sodas and juice drinks with added sugar. Sweets and Desserts Frosting. Pudding. Cookies. Cakes other than angel food cake. Candy that has milk chocolate or white chocolate, hydrogenated fat, butter, coconut, or unknown ingredients. Buttered syrups. Full-fat ice cream or ice cream drinks. Fats and Oils Gravy that has suet, meat fat, or shortening. Cocoa butter, hydrogenated oils, palm oil, coconut oil, palm kernel oil. These can often be found in baked products, candy, fried foods, nondairy creamers, and whipped toppings. Solid fats and shortenings, including bacon fat, salt pork, lard, and butter. Nondairy cream substitutes, such as coffee creamers and sour cream substitutes. Salad dressings that are made of unknown oils, cheese, or sour cream. The items listed above may not be a complete list of foods and beverages to avoid. Contact your dietitian for more information. This information is not intended to replace advice given to you by your health care provider. Make sure you discuss any questions you have with your health care provider. Document Released: 10/01/2011 Document Revised: 09/07/2015 Document Reviewed: 09/23/2013 Elsevier Interactive Patient Education  [employer].

## 2016-12-05 NOTE — Discharge Summary (Signed)
Discharge Summary    Patient ID: William [name],  MRN: [id number], DOB/AGE: November 13, 1928 83 y.o.  Admit date: 12/03/2016 Discharge date: 12/05/2016  Primary Care Provider: Pleas Koch Primary Cardiologist: Dr Percival Spanish  Discharge Diagnoses    Principal Problem:   NSTEMI (non-ST elevated myocardial infarction) [facility]) Active Problems:   Essential hypertension   Chronic kidney disease   PAF (paroxysmal atrial fibrillation) (HCC)   HLD (hyperlipidemia)   Chronic anticoagulation  Allergies No Known Allergies  Diagnostic Studies/Procedures    2D echo 12/04/2016 Study Conclusions - Left ventricle: Apical cavity obliteration in systole Given septal thickness can consider cardiac MRI to r/o non obstructive hypertrophic cardiomyopathy if clinically indicated. The cavity size was normal. Wall thickness was increased in a pattern of severe LVH. Systolic function was normal. The estimated ejection fraction was in the range of 60% to 65%. Doppler parameters are consistent with both elevated ventricular end-diastolic filling pressure and elevated left atrial filling pressure. - Aortic valve: There was mild regurgitation. - Left atrium: The atrium was mildly dilated. - Atrial septum: No defect or patent foramen ovale was identified.  Cardiac cath 12/05/2016 Conclusion    Dist LAD lesion, 50 %stenosed.  1st Sept lesion, 99 %stenosed.  LV end diastolic pressure is moderately elevated.  1. Single vessel obstructive CAD. There is subtotal occlusion of a small sub-branch of the first septal perforator. Otherwise mild nonobstructive disease. 2. Moderately elevated LVEDP  Plan: medical therapy.    _____________   History of Present Illness     83 y.o. male w/ hx PAF on Eliquis, HTN, HLD, Parkinson's, thyroid CA, TIA, SDH 2006, who was admitted 08/21 for CP, elevated troponin. Cath 08/23, after Eliquis washout.   Hospital Course     Consultants:  None   Mr Selman was pain-free in the ER. He was not on a BB, as he has baseline bradycardia. He was changed from simvastatin 20 mg to a high-dose statin (Lipitor 40 mg). He was continued on his home doses of Sinemet and losartan/HCTZ.   His Eliquis was held and heparin was started after 12 hours. CHADS2VASC= 6 (CAD, HTN, CVA x 2, age x 2). Because of the Eliquis, he is not on ASA.  He maintained SR during his hospital stay. He has chronic airspace consolidation on his CXR, no new changes.  His EF was preserved on echo. He was noted to have severe LVH - this could be due to episodic marked hypertension.  No mention of SAM or LVOT dynamic obstruction. Do not fee we should pursue workup further for HOCM Dx (cardiac MRI) as patient is not a candidate for ICD given advanced age and comorbidities. Perhaps offspring should consider having baseline echo to evaluate.   His renal function was abnormal, he was started on moderate hydration pre-cath. He was taken to the cath lab on 08/23, results are above. Medical therapy is recommended for CAD. He is to get an early BMET next week to follow up his renal function.  His BP was elevated at times, also low at times. There is concern that he has autonomic dysfunction due to the Parkinson's dz. He is to wear compression stockings and an abdominal binder. He had been taking prn hydralazine, continue this. He will be started on low-dose amlodipine to hopefully stabilize his BP. If this does not work, mestinon may be needed.   On 08/23 pm, Mr Santucci was ambulating at baseline. No further inpatient workup was indicated and he is considered  stable for discharge, to follow up as an outpatient.  _____________  Discharge Vitals Blood pressure (!) 148/93, pulse 69, temperature 98.1 F (36.7 C), temperature source Oral, resp. rate 14, height 6\' 3"  (1.905 m), weight 180 lb 4.8 oz (81.8 kg), SpO2 96 %.  Filed Weights   12/03/16 1730 12/04/16 0644  Weight: 180 lb  (81.6 kg) 180 lb 4.8 oz (81.8 kg)    Labs & Radiologic Studies    CBC  Recent Labs  12/03/16 1850 12/05/16 0207  WBC 6.7 5.7  NEUTROABS 5.1  --   HGB 15.3 13.5  HCT 47.1 43.7  MCV 92.2 93.6  PLT 168 235*   Basic Metabolic Panel  Recent Labs  12/03/16 1850 12/05/16 0207  NA 139 139  K 3.6 3.6  CL 101 101  CO2 28 31  GLUCOSE 97 95  BUN 22* 21*  CREATININE 1.47* 1.47*  CALCIUM 8.5* 8.4*   Liver Function Tests  Recent Labs  12/05/16 0207  AST 34  ALT 5*  ALKPHOS 51  BILITOT 0.9  PROT 6.2*  ALBUMIN 3.8   Cardiac Enzymes  Recent Labs  12/03/16 2220  TROPONINI 8.33*   Fasting Lipid Panel  Recent Labs  12/05/16 0207  CHOL 116  HDL 46  LDLCALC 42  TRIG 139  CHOLHDL 2.5    _____________  Dg Chest 2 View  Result Date: 12/03/2016 CLINICAL DATA:  History of AFib.  Palpitations. EXAM: CHEST  2 VIEW COMPARISON:  CT of the chest 11/09/2015 FINDINGS: Cardiomediastinal silhouette is normal. Mediastinal contours appear intact. There is no evidence of pleural effusion or pneumothorax. No lobar consolidation. Ill-defined peribronchial airspace opacities in her the right costophrenic angle. Osseous structures are without acute abnormality. Soft tissues are grossly normal. IMPRESSION: No active cardiopulmonary disease. Ill-defined peribronchial airspace opacities likely correlate to previously demonstrated chronic airspace consolidation. Electronically Signed   By: Fidela Salisbury M.D.   On: 12/03/2016 19:30   Disposition   Pt is being discharged home today in good condition.  Follow-up Plans & Appointments    Follow-up Information    Erlene Quan, PA-C Follow up on 12/12/2016.   Specialties:  Cardiology, Radiology Why:  Lab work at office visit. Please arrive at 11:15 am for an 11:30 am appt. Contact information: [address] [place] [address] [phone]          Discharge Instructions    Diet - low sodium heart healthy     Complete by:  As directed    Increase activity slowly    Complete by:  As directed       Discharge Medications   Current Discharge Medication List    START taking these medications   Details  amLODipine (NORVASC) 2.5 MG tablet Take 1 tablet (2.5 mg total) by mouth daily. Qty: 30 tablet, Refills: 6    atorvastatin (LIPITOR) 40 MG tablet Take 2 tablets (80 mg total) by mouth daily at 6 PM. Qty: 30 tablet, Refills: 12    losartan (COZAAR) 100 MG tablet Take 1 tablet (100 mg total) by mouth daily. Qty: 30 tablet, Refills: 11    nitroGLYCERIN (NITROSTAT) 0.4 MG SL tablet Place 1 tablet (0.4 mg total) under the tongue every 5 (five) minutes x 3 doses as needed for chest pain. Qty: 25 tablet, Refills: 12      CONTINUE these medications which have CHANGED   Details  hydrALAZINE (APRESOLINE) 25 MG tablet Take 1 tablet (25 mg total) by mouth 3 (three)  times daily as needed. Qty: 90 tablet, Refills: 3   Associated Diagnoses: Essential hypertension      CONTINUE these medications which have NOT CHANGED   Details  apixaban (ELIQUIS) 2.5 MG TABS tablet Take 1 tablet (2.5 mg total) by mouth 2 (two) times daily. Qty: 180 tablet, Refills: 3   Associated Diagnoses: Chronic atrial fibrillation (HCC)    carbidopa-levodopa (SINEMET IR) 25-100 MG tablet Take 1.5 tablets by mouth 4 (four) times daily. At 8, 12, 4 PM, and 8 PM Qty: 360 tablet, Refills: 3   Associated Diagnoses: [name] (HCC)    levothyroxine (SYNTHROID, LEVOTHROID) 100 MCG tablet TAKE 1 TABLET EVERY DAY BEFORE BREAKFAST Qty: 90 tablet, Refills: 1   Associated Diagnoses: Hypothyroidism    Multiple Vitamin (MULTIVITAMIN WITH MINERALS) TABS Take 1 tablet by mouth daily. Reported on 08/01/2015    Probiotic Product (ALIGN) 4 MG CAPS Take 1 capsule by mouth daily.    rasagiline (AZILECT) 1 MG TABS tablet Take 1 tablet (1 mg total) by mouth daily. Qty: 90 tablet, Refills: 3   Associated Diagnoses: [name]  ([place])      STOP taking these medications     losartan-hydrochlorothiazide (HYZAAR) 100-12.5 MG tablet      simvastatin (ZOCOR) 20 MG tablet          Aspirin prescribed at discharge?  No: Eliquis High Intensity Statin Prescribed? (Lipitor 40-80mg  or Crestor 20-40mg ): Yes Beta Blocker Prescribed? No: bradycardia For EF <40%, was ACEI/ARB Prescribed? Yes ADP Receptor Inhibitor Prescribed? (i.e. Plavix etc.-Includes Medically Managed Patients): No: no PCI, Eliquis For EF <40%, Aldosterone Inhibitor Prescribed? No: n/a Was EF assessed during THIS hospitalization? Yes Was Cardiac Rehab II ordered? (Included Medically managed Patients): No: age/frailty   Outstanding Labs/Studies   none  Duration of Discharge Encounter   Greater than 30 minutes including physician time.  Jonetta Speak NP 12/05/2016, 3:36 PM

## 2016-12-05 NOTE — Progress Notes (Addendum)
Progress Note  Patient Name: William [name] Date of Encounter: 12/05/2016  Primary Cardiologist: Dr. Percival Spanish  Subjective   Just back from cath with medical management recommended.  BP markedly elevated this am and go Hydralazine 10mg  IV with improvement.  BP was in the 778'E systolic yesterday and patient refused Hydralazine - he takes it PRN at home for HTN  Inpatient Medications    Scheduled Meds: . aspirin EC  81 mg Oral Daily  . atorvastatin  80 mg Oral q1800  . carbidopa-levodopa  1.5 tablet Oral QID  . heparin  5,000 Units Subcutaneous Q8H  . hydrALAZINE  50 mg Oral TID  . levothyroxine  100 mcg Oral QAC breakfast  . losartan  100 mg Oral Daily  . multivitamin with minerals  1 tablet Oral Daily  . rasagiline  1 mg Oral Daily  . saccharomyces boulardii  250 mg Oral Daily  . sodium chloride flush  3 mL Intravenous Q12H   Continuous Infusions: . sodium chloride    . sodium chloride 1 mL/kg/hr (12/05/16 0856)   PRN Meds: sodium chloride, acetaminophen, nitroGLYCERIN, ondansetron (ZOFRAN) IV, sodium chloride flush   Vital Signs    Vitals:   12/05/16 0855 12/05/16 0900 12/05/16 0905 12/05/16 0941  BP: (!) 144/64 139/80 135/66 140/80  Pulse: 61 (!) 59 (!) 57 63  Resp: (!) 0 15 12 11   Temp:      TempSrc:      SpO2: 94% 93% 93% 97%  Weight:      Height:        Intake/Output Summary (Last 24 hours) at 12/05/16 0952 Last data filed at 12/05/16 0602  Gross per 24 hour  Intake           1172.8 ml  Output              600 ml  Net            572.8 ml   Filed Weights   12/03/16 1730 12/04/16 0644  Weight: 180 lb (81.6 kg) 180 lb 4.8 oz (81.8 kg)    Telemetry    NSR - Personally Reviewed  ECG    No new EKG to review - Personally Reviewed  Physical Exam   GEN: No acute distress.   Neck: No JVD Cardiac: RRR, no murmurs, rubs, or gallops.  Respiratory: Clear to auscultation bilaterally. GI: Soft, nontender, non-distended  MS: No edema; No  deformity. Neuro:  Nonfocal  Psych: Normal affect   Labs    Chemistry Recent Labs Lab 12/03/16 1850 12/05/16 0207  NA 139 139  K 3.6 3.6  CL 101 101  CO2 28 31  GLUCOSE 97 95  BUN 22* 21*  CREATININE 1.47* 1.47*  CALCIUM 8.5* 8.4*  PROT  --  6.2*  ALBUMIN  --  3.8  AST  --  34  ALT  --  5*  ALKPHOS  --  51  BILITOT  --  0.9  GFRNONAA 41* 41*  GFRAA 48* 48*  ANIONGAP 10 7     Hematology Recent Labs Lab 12/03/16 1850 12/05/16 0207  WBC 6.7 5.7  RBC 5.11 4.67  HGB 15.3 13.5  HCT 47.1 43.7  MCV 92.2 93.6  MCH 29.9 28.9  MCHC 32.5 30.9  RDW 14.1 15.0  PLT 168 142*    Cardiac Enzymes Recent Labs Lab 12/03/16 2220  TROPONINI 8.33*    Recent Labs Lab 12/03/16 1900  TROPIPOC 1.66*     BNP Recent Labs Lab  12/03/16 1905  BNP 67.8     DDimer No results for input(s): DDIMER in the last 168 hours.   Radiology    Dg Chest 2 View  Result Date: 12/03/2016 CLINICAL DATA:  History of AFib.  Palpitations. EXAM: CHEST  2 VIEW COMPARISON:  CT of the chest 11/09/2015 FINDINGS: Cardiomediastinal silhouette is normal. Mediastinal contours appear intact. There is no evidence of pleural effusion or pneumothorax. No lobar consolidation. Ill-defined peribronchial airspace opacities in her the right costophrenic angle. Osseous structures are without acute abnormality. Soft tissues are grossly normal. IMPRESSION: No active cardiopulmonary disease. Ill-defined peribronchial airspace opacities likely correlate to previously demonstrated chronic airspace consolidation. Electronically Signed   By: Fidela Salisbury M.D.   On: 12/03/2016 19:30    Cardiac Studies   2D echo 12/04/2016 Study Conclusions  - Left ventricle: Apical cavity obliteration in systole Given   septal thickness can consider cardiac MRI to r/o non obstructive   hypertrophic cardiomyopathy   if clinically indicated. The cavity size was normal. Wall   thickness was increased in a pattern of severe  LVH. Systolic   function was normal. The estimated ejection fraction was in the   range of 60% to 65%. Doppler parameters are consistent with both   elevated ventricular end-diastolic filling pressure and elevated   left atrial filling pressure. - Aortic valve: There was mild regurgitation. - Left atrium: The atrium was mildly dilated. - Atrial septum: No defect or patent foramen ovale was identified.  Cardiac cath 12/05/2016 Conclusion     Dist LAD lesion, 50 %stenosed.  1st Sept lesion, 99 %stenosed.  LV end diastolic pressure is moderately elevated.   1. Single vessel obstructive CAD. There is subtotal occlusion of a small sub-branch of the first septal perforator. Otherwise mild nonobstructive disease. 2. Moderately elevated LVEDP  Plan: medical therapy.      Patient Profile     83 y.o. male w/ hx PAF on Eliquis, HTN, HLD, Parkinson's, thyroid CA, TIA, SDH 2006, who was admitted 08/21 for CP, elevated troponin. Cath planned 08/23, after Eliquis washout.  Assessment & Plan      1.   NSTEMI (non-ST elevated myocardial infarction) ([place]) - cath yesterday showed 50% distal LAD and 99% septal P1.  LVEDP noted in conclusion to be elevated but the LVEDP measurement was normal at 67mmHg.  Medical management recommended.   - continue on ASA,  high-dose statin and ARB.   - no BB due to bradycardia - 2D echo showed normal LVF with EF 60-65% with severe LVH and suggestive of ?HOCM but he also has significant HTN - would avoid nitrates which can drop preload and exacerbate increased gradient in setting of ? HOCM  2.  Essential hypertension - BP poorly controlled this am at 200/174mmHg.  His SBP ranged from 91-140mmHg yesterday so PRN Hydralazine was not given.   - his wife says that he has wide swings in his BP (likely has autonomic dysfunction from underlying Parkinson's) - Will add low dose amlodipine 2.5mg  daily to try to get BP better controlled.   - will not restart diuretic  as he may have HOCM and had LVEDP of 53mmHg at cath.    3.   Chronic kidney disease - creatinine stable after pre cath hydration at 1.47 (appears at baseline)  4.  PAF (paroxysmal atrial fibrillation) (HCC) - maintaining SR - SB on tele - no BB due to bradycardia - Restart Eliquis tomorrow for CHADS2VASC score of 6.  5.  HLD (  hyperlipidemia) - LDL at goal at 42 - continue high dose statin  6.  Dysautonomia likely secondary to Parkinson's - Wide fluctuations in BP - will add compression hose and abdominal binder for low BPs. - would avoid Florinef or midodrine due to episodic markedly elevated BP - may need to consider addition of Pyridostigmine as outpt if continues to have wide swings in BP  7.  Possible HOCM - noted on echo to have severe LVH - this could be due to episodic marked hypertension.  No mention of SAM or LVOT dynamic obstruction.   - would not pursue workup further for HOCM Dx (cardiac MRI) as patient is not a candidate for ICD given advanced age and comorbidities.  - would consider having offspring get baseline echo to evaluate.   If BP stable later this afternoon will discharge home with early followup with Dr. Percival Spanish in the office. He will need a followup BMET on Monday to make sure creatinine stable post cath.  Signed, Fransico Him, MD  12/05/2016, 9:52 AM

## 2016-12-05 NOTE — Interval H&P Note (Signed)
History and Physical Interval Note:  12/05/2016 7:44 AM  William [name]  has presented today for surgery, with the diagnosis of nstemi  The various methods of treatment have been discussed with the patient and family. After consideration of risks, benefits and other options for treatment, the patient has consented to  Procedure(s): LEFT HEART CATH AND CORONARY ANGIOGRAPHY (N/A) as a surgical intervention .  The patient's history has been reviewed, patient examined, no change in status, stable for surgery.  I have reviewed the patient's chart and labs.  Questions were answered to the patient's satisfaction.   Cath Lab Visit (complete for each Cath Lab visit)  Clinical Evaluation Leading to the Procedure:   ACS: Yes.    Non-ACS:    Anginal Classification: CCS IV  Anti-ischemic medical therapy: Minimal Therapy (1 class of medications)  Non-Invasive Test Results: No non-invasive testing performed  Prior CABG: No previous CABG        Collier Salina [facility] 12/05/2016 7:44 AM

## 2016-12-05 NOTE — Telephone Encounter (Signed)
New message     TOC appt with Kerin Ransom 8/30 1130a

## 2016-12-05 NOTE — Progress Notes (Addendum)
ANTICOAGULATION CONSULT NOTE - FOLLOW UP  HL = 0.96 (goal 0.3 - 0.7 units/mL) APTT = 110 (goal 66 - 102 sec) Heparin dosing weight = 82 kg  Assessment: 88 YOM with history of Afib on Eliquis PTA, last dose on 12/03/16 at 1830.  Patient was transitioned to IV heparin for ACS with plan for cath today.  Heparin level is elevated due to effect of Eliquis. Currently using aPTT to assist with heparin dosing.  APTT is supratherapeutic at 110 and drawn away from gtt per RN. Hgb and plts downtrending, however, no bleeding reported.  Plan: Reduce heparin gtt to 1000 units/hr Heparin level and aPTT in 8 hrs Daily heparin level, aPTT, and CBC  Monitor for s/s bleeding F/u cath/cardiology plan   Argie Ramming, PharmD Clinical Pharmacist 12/05/16 4:36 AM

## 2016-12-05 NOTE — H&P (View-Only) (Signed)
Progress Note  Patient Name: William [name] Date of Encounter: 12/04/2016  Primary Cardiologist: Dr Percival Spanish  Subjective   No CP, no SOB overnight, no ?'s about cath.   Inpatient Medications    Scheduled Meds: . aspirin EC  81 mg Oral Daily  . carbidopa-levodopa  1 tablet Oral TID  . hydrALAZINE  25 mg Oral TID  . levothyroxine  100 mcg Oral QAC breakfast  . losartan  100 mg Oral Daily  . rasagiline  1 mg Oral Daily  . simvastatin  20 mg Oral q1800   Continuous Infusions: . heparin 1,200 Units/hr (12/04/16 0600)   PRN Meds: acetaminophen, nitroGLYCERIN, ondansetron (ZOFRAN) IV   Vital Signs    Vitals:   12/03/16 2300 12/03/16 2330 12/04/16 0503 12/04/16 0644  BP: (!) 148/85 (!) 154/92 (!) 157/99 (!) 156/90  Pulse: (!) 54 (!) 53  65  Resp: 13 12    Temp:    98.6 F (37 C)  TempSrc:      SpO2: 96% 97%  94%  Weight:    180 lb 4.8 oz (81.8 kg)  Height:        Intake/Output Summary (Last 24 hours) at 12/04/16 0743 Last data filed at 12/04/16 0700  Gross per 24 hour  Intake               27 ml  Output              550 ml  Net             -523 ml   Filed Weights   12/03/16 1730 12/04/16 0644  Weight: 180 lb (81.6 kg) 180 lb 4.8 oz (81.8 kg)    Telemetry    SR, sinus brady to high 40s, RBBB, PVCs and rare pairs - Personally Reviewed  ECG    SR, RBBB, ?LAE - Personally Reviewed  Physical Exam   GEN: No acute distress.   Neck: No JVD Cardiac: RRR, soft murmur, no rubs, or gallops.  Respiratory: Clear to auscultation bilaterally. GI: Soft, nontender, non-distended  MS: No edema; No deformity. Neuro:  Nonfocal  Psych: Normal affect   Labs    Chemistry  Recent Labs Lab 12/03/16 1850  NA 139  K 3.6  CL 101  CO2 28  GLUCOSE 97  BUN 22*  CREATININE 1.47*  CALCIUM 8.5*  GFRNONAA 41*  GFRAA 48*  ANIONGAP 10     Hematology  Recent Labs Lab 12/03/16 1850  WBC 6.7  RBC 5.11  HGB 15.3  HCT 47.1  MCV 92.2  MCH 29.9  MCHC 32.5    RDW 14.1  PLT 168    Cardiac Enzymes  Recent Labs Lab 12/03/16 2220  TROPONINI 8.33*     Recent Labs Lab 12/03/16 1900  TROPIPOC 1.66*     BNP  Recent Labs Lab 12/03/16 1905  BNP 67.8     Radiology    Dg Chest 2 View  Result Date: 12/03/2016 CLINICAL DATA:  History of AFib.  Palpitations. EXAM: CHEST  2 VIEW COMPARISON:  CT of the chest 11/09/2015 FINDINGS: Cardiomediastinal silhouette is normal. Mediastinal contours appear intact. There is no evidence of pleural effusion or pneumothorax. No lobar consolidation. Ill-defined peribronchial airspace opacities in her the right costophrenic angle. Osseous structures are without acute abnormality. Soft tissues are grossly normal. IMPRESSION: No active cardiopulmonary disease. Ill-defined peribronchial airspace opacities likely correlate to previously demonstrated chronic airspace consolidation. Electronically Signed   By: Linwood Dibbles.D.  On: 12/03/2016 19:30    Cardiac Studies   Cath 08/23, will ck echo  Patient Profile     83 y.o. male w/ hx PAF on Eliquis, HTN, HLD, Parkinson's, thyroid CA, TIA, SDH 2006, who was admitted 08/21 for CP, elevated troponin. Cath planned 08/23, after Eliquis washout.  Assessment & Plan    Principal Problem: 1.   NSTEMI (non-ST elevated myocardial infarction) ([place]) - cath once Eliquis washout completed, tomorrow planned - no more CP/SOB - on ASA, change to high-dose statin, no BB due to bradycardia, on ARB - ck echo to minimize dye use and get EF - pt on board 1st case, orders written  Active Problems: 2.  Essential hypertension - BP not well-controlled, on home rx except HCTZ 12.5 mg qd - will increase hydralazine to 50 mg tid  3.   Chronic kidney disease - recheck BMET in am - start pre-cath hydration tonight  4.  PAF (paroxysmal atrial fibrillation) (HCC) - maintaining SR   5.  HLD (hyperlipidemia) - ck lipids in am  6.  Chronic anticoagulation - Eliquis on  hold, on heparin - CHADS2VASC= 6 (CAD, HTN, CVA x 2, age x 2)   Signed, Lenoard Aden  12/04/2016, 7:43 AM

## 2016-12-05 NOTE — Progress Notes (Signed)
Orthopedic Tech Progress Note Patient Details:  William [name] 1928/08/27 [id number]  Ortho Devices Type of Ortho Device: Abdominal binder Ortho Device/Splint Location: abdomen Ortho Device/Splint Interventions: [name], [name] 12/05/2016, 1:53 PM

## 2016-12-05 NOTE — Telephone Encounter (Signed)
New message    TOC appt made per Rosaria Ferries for 8/30 at 230pm with Ellen Henri.

## 2016-12-05 NOTE — Progress Notes (Signed)
Bleeding noted at right radial cath site.  Soaked 2x2 dressing post trip to bathroom.  Pressure held for 5 minutes.  Pressure dressing reapplied.  Spoke to [employer], PA new order received to keep patient  For 2 hours, monitor site and re check BP.

## 2016-12-05 NOTE — Progress Notes (Signed)
Site area: rt groin fa sheath Site Prior to Removal:  Level 0 Pressure Applied For: 20 minutes Manual:   yes Patient Status During Pull:  stable Post Pull Site:  Level  0 Post Pull Instructions Given:  yes Post Pull Pulses Present: palpable Dressing Applied:  Gauze and tegaderm Bedrest begins @ 4888 Comments:

## 2016-12-05 NOTE — Progress Notes (Signed)
Right radial cath site oozing blood again.  Cath lab called. Mancel Bale, NP came to assess.  She held pressure and applied coband dressing.  Stated ok to discharge at 6 pm if no bleeding.

## 2016-12-06 NOTE — Telephone Encounter (Signed)
See other telephone note. [address] is also trying to call the patient for [facility]

## 2016-12-06 NOTE — Telephone Encounter (Signed)
1st attempted [facility] Call: I left a message on both the pt's home and cell phones asking him to return my call.

## 2016-12-09 NOTE — Telephone Encounter (Signed)
Patient contacted regarding discharge from [facility] on 12/05/16.  Patient understands to follow up with provider Kerin Ransom, PA-c on 12/12/16 at 11:30 at our [facility] office. Patient understands discharge instructions? yes Patient understands medications and regiment? yes Patient understands to bring all medications to this visit?   The pt states that he is well at this time and is without any complaints.

## 2016-12-12 ENCOUNTER — Ambulatory Visit (INDEPENDENT_AMBULATORY_CARE_PROVIDER_SITE_OTHER): Payer: Medicare HMO | Admitting: Cardiology

## 2016-12-12 ENCOUNTER — Ambulatory Visit: Payer: Medicare HMO | Admitting: Cardiology

## 2016-12-12 ENCOUNTER — Encounter: Payer: Self-pay | Admitting: Cardiology

## 2016-12-12 VITALS — BP 100/60 | HR 52 | Ht 76.0 in | Wt 178.4 lb

## 2016-12-12 DIAGNOSIS — Z7901 Long term (current) use of anticoagulants: Secondary | ICD-10-CM

## 2016-12-12 DIAGNOSIS — G20 Parkinson's disease: Secondary | ICD-10-CM

## 2016-12-12 DIAGNOSIS — I480 Paroxysmal atrial fibrillation: Secondary | ICD-10-CM

## 2016-12-12 DIAGNOSIS — I10 Essential (primary) hypertension: Secondary | ICD-10-CM

## 2016-12-12 DIAGNOSIS — I2510 Atherosclerotic heart disease of native coronary artery without angina pectoris: Secondary | ICD-10-CM

## 2016-12-12 DIAGNOSIS — N183 Chronic kidney disease, stage 3 unspecified: Secondary | ICD-10-CM

## 2016-12-12 DIAGNOSIS — E7849 Other hyperlipidemia: Secondary | ICD-10-CM

## 2016-12-12 DIAGNOSIS — E784 Other hyperlipidemia: Secondary | ICD-10-CM | POA: Diagnosis not present

## 2016-12-12 DIAGNOSIS — I214 Non-ST elevation (NSTEMI) myocardial infarction: Secondary | ICD-10-CM | POA: Diagnosis not present

## 2016-12-12 DIAGNOSIS — Z79899 Other long term (current) drug therapy: Secondary | ICD-10-CM

## 2016-12-12 DIAGNOSIS — I5031 Acute diastolic (congestive) heart failure: Secondary | ICD-10-CM

## 2016-12-12 DIAGNOSIS — I5030 Unspecified diastolic (congestive) heart failure: Secondary | ICD-10-CM | POA: Insufficient documentation

## 2016-12-12 MED ORDER — ATORVASTATIN CALCIUM 40 MG PO TABS: 40.0000 mg | ORAL_TABLET | Freq: Every day | ORAL | 12 refills | Status: DC

## 2016-12-12 NOTE — Assessment & Plan Note (Signed)
Chronic autonomic dysfunction contributing to labile B/P

## 2016-12-12 NOTE — Assessment & Plan Note (Signed)
12/03/16- here for initial post hospital visit

## 2016-12-12 NOTE — Assessment & Plan Note (Signed)
On Lipitor-LDL 49

## 2016-12-12 NOTE — Progress Notes (Signed)
12/12/2016 William [name]   10/20/1928  [id number]  Primary Physician Pleas Koch, NP Primary Cardiologist: Dr Percival Spanish  HPI:  83 y.o.malew/ hx PAF on Eliquis, labile HTN, HLD, Parkinson's, CRI-3, and RBBB who was admitted 12/03/16 for palpitations and elevated B/P and was found to have an elevated troponin POC- 1.66. The pt was at a Parkinson's exercise class on the morning of admission when he noted tachycardia. He went home and his wife took his B/P which was elevated. They waited 10 minutes to see if it would come down ( he has long history of labile B/P). When it didn't they went to the ED. The pt denies he had any chest pain or SOB. In the ED he was in NSR, his BNP was normal. His POC was elevated and subsequent Troponin I was also elevated-8.33. He was admitted as a NSTEMI. Cath done 8/23 revealed 99% septal perforator and 50% LAD. His LVEDP was elevated at cath. Echo done 12/04/16 showed an EF of 60-65% with severe LVH and elevated LVEDP and LA pressure.   The plan is for medical Rx and he was discharged 12/05/16 and is in the office today for follow up. At discharge the pt and wife were told he needed to avoid high B/P. Unfortunately this has been a chronic problem. Ms Landstrom had B/P readings from home that show systolic B/P's as high as 180. He takes Hydralazine PRN and has been doing this for years. His B/P in the office by me was 277 systolic. They have had their B/P cuff checked against the office cuff in the past. Overall he is doing well, no chest pain, no SOB, and no syncope.    Current Outpatient Prescriptions  Medication Sig Dispense Refill  . amLODipine (NORVASC) 2.5 MG tablet Take 1 tablet (2.5 mg total) by mouth daily. 30 tablet 6  . apixaban (ELIQUIS) 2.5 MG TABS tablet Take 1 tablet (2.5 mg total) by mouth 2 (two) times daily. 180 tablet 3  . atorvastatin (LIPITOR) 40 MG tablet Take 1 tablet (40 mg total) by mouth daily at 6 PM. 30 tablet 12  .  carbidopa-levodopa (SINEMET IR) 25-100 MG tablet Take 1.5 tablets by mouth 4 (four) times daily. At 8, 12, 4 PM, and 8 PM 360 tablet 3  . hydrALAZINE (APRESOLINE) 25 MG tablet Take 1 tablet (25 mg total) by mouth 3 (three) times daily as needed. 90 tablet 3  . levothyroxine (SYNTHROID, LEVOTHROID) 100 MCG tablet TAKE 1 TABLET EVERY DAY BEFORE BREAKFAST 90 tablet 1  . losartan (COZAAR) 100 MG tablet Take 1 tablet (100 mg total) by mouth daily. 30 tablet 11  . Multiple Vitamin (MULTIVITAMIN WITH MINERALS) TABS Take 1 tablet by mouth daily. Reported on 08/01/2015    . nitroGLYCERIN (NITROSTAT) 0.4 MG SL tablet Place 1 tablet (0.4 mg total) under the tongue every 5 (five) minutes x 3 doses as needed for chest pain. 25 tablet 12  . Probiotic Product (ALIGN) 4 MG CAPS Take 1 capsule by mouth daily.    . rasagiline (AZILECT) 1 MG TABS tablet Take 1 tablet (1 mg total) by mouth daily. 90 tablet 3   No current facility-administered medications for this visit.     No Known Allergies  Past Medical History:  Diagnosis Date  . A-fib ([place])   . Arthritis    "right knee" (08/09/2014)  . Basal cell carcinoma   . Benign prostatic hypertrophy with urinary obstruction   . Chronic anticoagulation 12/04/2016  .  Complication of anesthesia    "difficulty intubation, had to use fiberoptic 2006"  . Difficult intubation   . DJD (degenerative joint disease)   . Gout   . Hx of colonic polyps   . Hyperlipidemia   . Hypertension    sees [name]  . Hypothyroidism   . Kidney tumor    "tumor on cyst on kidney"   . 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 Mini stroke ([place])   . Parkinson disease ([place])   . Pneumonia    hx of in 1952  . Primary skin squamous cell carcinoma   . Subdural hematoma ([place]) 2006  . TIA (transient ischemic attack)    pt does not recall this hx "but may have had one today" (08/09/2014)    Social History   Social History  . Marital status: Widowed     Spouse name: N/A  . Number of children: 3  . Years of education: 15   Occupational History  . retired    Social History Main Topics  . Smoking status: Former Smoker    Packs/day: 0.80    Years: 15.00    Types: Cigarettes    Quit date: 04/15/1965  . Smokeless tobacco: Never Used  . Alcohol use 3.0 oz/week    5 Glasses of wine per week     Comment: 1 glass of wine every night (6oz glass)  . Drug use: No  . Sexual activity: Yes   Other Topics Concern  . Not on file   Social History Narrative   Married.   Lives at home with significant other.   Retried. 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   Enjoys golfing, yard work, spending time with family.          Family History  Problem Relation Age of Onset  . Stroke Mother   . Stroke Father   . Heart disease Father   . Alzheimer's disease Brother   . Alzheimer's disease Sister      Review of Systems: General: negative for chills, fever, night sweats or weight changes.  Cardiovascular: negative for chest pain, dyspnea on exertion, edema, orthopnea, palpitations, paroxysmal nocturnal dyspnea or shortness of breath Dermatological: negative for rash Respiratory: negative for cough or wheezing Urologic: negative for hematuria Abdominal: negative for nausea, vomiting, diarrhea, bright red blood per rectum, melena, or hematemesis Neurologic: negative for visual changes, syncope, or dizziness All other systems reviewed and are otherwise negative except as noted above.    Blood pressure 100/60, pulse (!) 52, height 6\' 4"  (1.93 m), weight 178 lb 6.4 oz (80.9 kg).  General appearance: alert, cooperative and no distress Neck: no carotid bruit and no JVD Lungs: clear to auscultation bilaterally Heart: regular rate and rhythm Extremities: no edema Skin: Skin color, texture, turgor normal. No rashes or lesions Neurologic: Grossly normal, resting tremor  EKG NSR, SB-52, RBBB  ASSESSMENT AND PLAN:    NSTEMI (non-ST elevated  myocardial infarction) ([place]) 12/03/16- here for initial post hospital visit  Diastolic CHF ([place]) Elevated LVEDP at cath but no symptoms of CHF  CAD (coronary artery disease), native coronary artery 99% septal perf, 50% LAD 12/05/16  Essential hypertension Labile B/P- this has been a chronic issue  HLD (hyperlipidemia) On Lipitor-LDL 57  Long term current use of anticoagulant therapy Eliquis 2.5 mg BID  PAF (paroxysmal atrial fibrillation) (HCC) It sounds like he may have had an episode of PAF that prompted his admission 12/03/16  Parkinson disease, symptomatic ([place]) Chronic autonomic dysfunction contributing to labile B/P   PLAN  Mr Hyun has Parkinson's with significant autonomic dysfunction and chronically labile B/P. I suggested they continue the current regimen and will arrange for a f/u in the B/P clinic with our pharmacist. There may be some new medication options but the question will be affordability.   I ordered a BMP to f/u his SCr after cath.   He is OK to resume his Parkinson's exercise program. .   Kerin Ransom PA-C 12/12/2016 1:23 PM

## 2016-12-12 NOTE — Assessment & Plan Note (Signed)
Eliquis 2.5mg BID.  

## 2016-12-12 NOTE — Assessment & Plan Note (Signed)
It sounds like he may have had an episode of PAF that prompted his admission 12/03/16

## 2016-12-12 NOTE — Assessment & Plan Note (Signed)
Elevated LVEDP at cath

## 2016-12-12 NOTE — Patient Instructions (Addendum)
Medication Instructions: DECREASE the Atorvastatin (Lipitor) to 40 mg tablet daily.   If you need a refill on your cardiac medications before your next appointment, please call your pharmacy.   Labwork: Your physician recommends that you have the following lab today: BMP   Follow-Up: Your physician wants you to follow-up with the our pharmacy clinic to help monitor your blood pressure Erasmo Downer and the HTN clinic)  Your physician recommends that you schedule a follow-up appointment in: 3 months with Dr. Percival Spanish.     Thank you for choosing [facility]!!

## 2016-12-12 NOTE — Assessment & Plan Note (Signed)
99% septal perf, 50% LAD 12/05/16

## 2016-12-12 NOTE — Assessment & Plan Note (Signed)
Labile B/P- this has been a chronic issue

## 2016-12-13 LAB — BASIC METABOLIC PANEL
BUN/Creatinine Ratio: 17 (ref 10–24)
BUN: 26 mg/dL (ref 8–27)
CO2: 25 mmol/L (ref 20–29)
Calcium: 8.8 mg/dL (ref 8.6–10.2)
Chloride: 99 mmol/L (ref 96–106)
Creatinine, Ser: 1.5 mg/dL — ABNORMAL HIGH (ref 0.76–1.27)
GFR calc Af Amer: 48 mL/min/{1.73_m2} — ABNORMAL LOW (ref >59)
GFR calc non Af Amer: 42 mL/min/{1.73_m2} — ABNORMAL LOW (ref >59)
Glucose: 86 mg/dL (ref 65–99)
Potassium: 4.7 mmol/L (ref 3.5–5.2)
Sodium: 144 mmol/L (ref 134–144)

## 2016-12-18 ENCOUNTER — Ambulatory Visit (INDEPENDENT_AMBULATORY_CARE_PROVIDER_SITE_OTHER): Payer: Medicare HMO | Admitting: Pharmacist Clinician (PhC)/ Clinical Pharmacy Specialist

## 2016-12-18 DIAGNOSIS — I10 Essential (primary) hypertension: Secondary | ICD-10-CM | POA: Diagnosis not present

## 2016-12-18 NOTE — Progress Notes (Signed)
12/18/2016 [name] October 24, 1928 [id number]   HPI:  William [name] is a 83 y.o. male patient of Dr Percival Spanish, with a PMH below who presents today for hypertension clinic evaluation.   He was seen by Kerin Ransom last week with concerns of chronic labile hypertension.  His medical history is significant for PAF (on Eliquis), chronic renal insufficiency (stage 3), RBBB and [name].  He was admitted [facility] on Aug 21 with a NSTEMI and discharged 2 days later.  He is feeling well today and has no complaints other than his varying blood pressure readings.    Patient is here today with his second wife, and they report that he has had labile blood pressure for at least the past 8-10 years.  He was previously followed by Dr. Lenna Gilford and was fairly well controlled with losartan hctz and hydralazine prn.  They report worsening of his readings in the past several months.  He reports no orthostatic symptoms, arises slowly from bed in the mornings and generally feels fine.  His wife notes that when his BP gets "high" he tends to get red in the face, and that is when she will check readings.  He was given compression stockings and an abdominal binder on discharge, but the binder is several inches too small to get around his abdomen.  He has not used either product to date.    Blood Pressure Goal:  150/80 (due to Parkinson's, age and wide range of current home readings)  Current Medications:  Amlodipine 2.5 mg qam  Losartan 100 mg qam  Hydralazine 25 mg prn SBP > 180 (takes 2-4 doses per day)   Family Hx:  Father had stroke at 71 (died); mother from [place] at 57  Sister 24, brother 71 both from [place]  2 of 3 sons with hypertension (both in 16's)  Social Hx:  No tobacco, quit 52 years ago; glass of wine most nights (red); 1 coffee/tea daily Diet:  Mostly home cooked meals (esp since MI); no added salt; mostly chicken and fish, occasional beef (taco); fresh fruits throughout  summer; stir fry veggies with olive oil; enjoys sweets - ice cream, cookies;   Exercise:  [employer] was twice weekly (strenuous therapy designed for Parkinson's patients to help with mobility, balance and switching from task to task), stopped at MI; some stretching, stationary bike at home  Home BP readings:  Home cuff 81 years old Medline brand, read within 5 points of office cuff.  Also has wrist monitor, that read about 20 points higher systolic.   Has been checking home BP 4 times per day.  Morning and nighttime readings are markedly elevated (AM avg 201/102; PM 164/91), however mid-day readings can vary from 462-703 systolic.    Intolerances:   NKDA  Estimated Creatinine Clearance: 40.5 mL/min (A) (by C-G formula based on SCr of 1.5 mg/dL (H)).  Wt Readings from Last 3 Encounters:  12/12/16 178 lb 6.4 oz (80.9 kg)  12/04/16 180 lb 4.8 oz (81.8 kg)  08/26/16 178 lb (80.7 kg)   BP Readings from Last 3 Encounters:  12/18/16 128/66  12/12/16 100/60  12/05/16 (!) 151/85   Pulse Readings from Last 3 Encounters:  12/18/16 (!) 48  12/12/16 (!) 52  12/05/16 69    Current Outpatient Prescriptions  Medication Sig Dispense Refill  . amLODipine (NORVASC) 2.5 MG tablet Take 1 tablet (2.5 mg total) by mouth daily. 30 tablet 6  . apixaban (ELIQUIS) 2.5 MG TABS  tablet Take 1 tablet (2.5 mg total) by mouth 2 (two) times daily. 180 tablet 3  . atorvastatin (LIPITOR) 40 MG tablet Take 1 tablet (40 mg total) by mouth daily at 6 PM. 30 tablet 12  . carbidopa-levodopa (SINEMET IR) 25-100 MG tablet Take 1.5 tablets by mouth 4 (four) times daily. At 8, 12, 4 PM, and 8 PM 360 tablet 3  . hydrALAZINE (APRESOLINE) 25 MG tablet Take 1 tablet (25 mg total) by mouth 3 (three) times daily as needed. 90 tablet 3  . levothyroxine (SYNTHROID, LEVOTHROID) 100 MCG tablet TAKE 1 TABLET EVERY DAY BEFORE BREAKFAST 90 tablet 1  . losartan (COZAAR) 100 MG tablet Take 1 tablet (100 mg total) by mouth daily.  30 tablet 11  . Multiple Vitamin (MULTIVITAMIN WITH MINERALS) TABS Take 1 tablet by mouth daily. Reported on 08/01/2015    . nitroGLYCERIN (NITROSTAT) 0.4 MG SL tablet Place 1 tablet (0.4 mg total) under the tongue every 5 (five) minutes x 3 doses as needed for chest pain. 25 tablet 12  . Probiotic Product (ALIGN) 4 MG CAPS Take 1 capsule by mouth daily.    . rasagiline (AZILECT) 1 MG TABS tablet Take 1 tablet (1 mg total) by mouth daily. 90 tablet 3   No current facility-administered medications for this visit.     No Known Allergies  Past Medical History:  Diagnosis Date  . A-fib ([place])   . Arthritis    "right knee" (08/09/2014)  . Basal cell carcinoma   . Benign prostatic hypertrophy with urinary obstruction   . Chronic anticoagulation 12/04/2016  . Complication of anesthesia    "difficulty intubation, had to use fiberoptic 2006"  . Difficult intubation   . DJD (degenerative joint disease)   . Gout   . Hx of colonic polyps   . Hyperlipidemia   . Hypertension    sees [name]  . Hypothyroidism   . Kidney tumor    "tumor on cyst on kidney"   . 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 Mini stroke ([place])   . Parkinson disease ([place])   . Pneumonia    hx of in 1952  . Primary skin squamous cell carcinoma   . Subdural hematoma ([place]) 2006  . TIA (transient ischemic attack)    pt does not recall this hx "but may have had one today" (08/09/2014)    Blood pressure 128/66, pulse (!) 48.  Standing  Pressure 120/64  Essential hypertension Patient with long-standing history of labile blood pressures.  While the readings vary by almost 100 points throughout the day, he doesn't have any symptomatic issues and the variances are not related to positional change.  I think the first goal is to get his overall blood pressure down without dropping the low end systolic readings < 973.  I will increase the amlodipine to 5 mg and have him move it to evenings.  He  will continue with the losartan each morning for now.  I have asked that they check his blood pressure twice daily and take the hydralazine for systolic readings > 532.   They can also check his pressure if he becomes red faced or otherwise has concerns.  They have our direct number to call should he develop problems with low readings.  Will see him back in a month for follow up.     Tommy Medal PharmD CPP [facility]

## 2016-12-18 NOTE — Patient Instructions (Addendum)
Return for a a follow up appointment on October 2   Your blood pressure today is 128/62  Check your blood pressure at home daily (if able) and keep record of the readings.  Take your BP meds as follows:  Increase amlodipine to 5 mg and take at bedtime  Continue with losartan each morning  Take hydralazine 25 mg up to 3-4 times per day for systolic pressure > 542  (check if red-faced or feeling symptomatic)   Bring all of your record of home blood pressures to your next appointment.  Exercise as you're able, try to walk approximately 30 minutes per day.  Keep salt intake to a minimum, especially watch canned and prepared boxed foods.  Eat more fresh fruits and vegetables and fewer canned items.  Avoid eating in fast food restaurants.    HOW TO TAKE YOUR BLOOD PRESSURE: . Rest 5 minutes before taking your blood pressure. .  Don't smoke or drink caffeinated beverages for at least 30 minutes before. . Take your blood pressure before (not after) you eat. . Sit comfortably with your back supported and both feet on the floor (don't cross your legs). . Elevate your arm to heart level on a table or a desk. . Use the proper sized cuff. It should fit smoothly and snugly around your bare upper arm. There should be enough room to slip a fingertip under the cuff. The bottom edge of the cuff should be 1 inch above the crease of the elbow. . Ideally, take 3 measurements at one sitting and record the average.

## 2016-12-18 NOTE — Assessment & Plan Note (Signed)
Patient with long-standing history of labile blood pressures.  While the readings vary by almost 100 points throughout the day, he doesn't have any symptomatic issues and the variances are not related to positional change.  I think the first goal is to get his overall blood pressure down without dropping the low end systolic readings < 109.  I will increase the amlodipine to 5 mg and have him move it to evenings.  He will continue with the losartan each morning for now.  I have asked that they check his blood pressure twice daily and take the hydralazine for systolic readings > 323.   They can also check his pressure if he becomes red faced or otherwise has concerns.  They have our direct number to call should he develop problems with low readings.  Will see him back in a month for follow up.

## 2016-12-19 ENCOUNTER — Telehealth: Payer: Self-pay | Admitting: Cardiology

## 2016-12-19 MED ORDER — AMLODIPINE BESYLATE 5 MG PO TABS: 5.0000 mg | ORAL_TABLET | Freq: Every day | ORAL | 3 refills | Status: DC

## 2016-12-19 NOTE — Telephone Encounter (Signed)
Refill sent to the pharmacy electronically.  

## 2016-12-19 NOTE — Telephone Encounter (Signed)
New Message    Pt c/o medication issue:  1. Name of Medication:  amLODipine (NORVASC) 2.5 MG tablet Take 1 tablet (2.5 mg total) by mouth daily.     2. How are you currently taking this medication (dosage and times per day)?  Pt states medication was increased to 5mg ?   3. Are you having a reaction (difficulty breathing--STAT)? no 4. What is your medication issue?  If medication was increased to 5mg  they will need a new prescription for the increase

## 2016-12-23 ENCOUNTER — Other Ambulatory Visit: Payer: Self-pay | Admitting: Dermatology

## 2016-12-23 DIAGNOSIS — L570 Actinic keratosis: Secondary | ICD-10-CM | POA: Diagnosis not present

## 2016-12-23 DIAGNOSIS — C44719 Basal cell carcinoma of skin of left lower limb, including hip: Secondary | ICD-10-CM | POA: Diagnosis not present

## 2016-12-31 ENCOUNTER — Encounter: Payer: Self-pay | Admitting: Primary Care

## 2016-12-31 ENCOUNTER — Encounter: Payer: Self-pay | Admitting: Neurology

## 2016-12-31 ENCOUNTER — Ambulatory Visit (INDEPENDENT_AMBULATORY_CARE_PROVIDER_SITE_OTHER): Payer: Medicare HMO | Admitting: Neurology

## 2016-12-31 ENCOUNTER — Ambulatory Visit (INDEPENDENT_AMBULATORY_CARE_PROVIDER_SITE_OTHER): Payer: Medicare HMO | Admitting: Primary Care

## 2016-12-31 ENCOUNTER — Telehealth: Payer: Self-pay | Admitting: Pharmacist Clinician (PhC)/ Clinical Pharmacy Specialist

## 2016-12-31 VITALS — BP 98/57 | HR 47 | Ht 76.0 in | Wt 178.0 lb

## 2016-12-31 VITALS — BP 144/64 | HR 53 | Temp 98.1°F | Ht 76.0 in | Wt 179.1 lb

## 2016-12-31 DIAGNOSIS — Z23 Encounter for immunization: Secondary | ICD-10-CM | POA: Diagnosis not present

## 2016-12-31 DIAGNOSIS — I10 Essential (primary) hypertension: Secondary | ICD-10-CM | POA: Diagnosis not present

## 2016-12-31 DIAGNOSIS — I480 Paroxysmal atrial fibrillation: Secondary | ICD-10-CM | POA: Diagnosis not present

## 2016-12-31 DIAGNOSIS — G20 Parkinson's disease: Secondary | ICD-10-CM

## 2016-12-31 DIAGNOSIS — E890 Postprocedural hypothyroidism: Secondary | ICD-10-CM

## 2016-12-31 DIAGNOSIS — E784 Other hyperlipidemia: Secondary | ICD-10-CM

## 2016-12-31 DIAGNOSIS — I952 Hypotension due to drugs: Secondary | ICD-10-CM | POA: Diagnosis not present

## 2016-12-31 DIAGNOSIS — I214 Non-ST elevation (NSTEMI) myocardial infarction: Secondary | ICD-10-CM | POA: Diagnosis not present

## 2016-12-31 DIAGNOSIS — E7849 Other hyperlipidemia: Secondary | ICD-10-CM

## 2016-12-31 DIAGNOSIS — E039 Hypothyroidism, unspecified: Secondary | ICD-10-CM

## 2016-12-31 DIAGNOSIS — I2510 Atherosclerotic heart disease of native coronary artery without angina pectoris: Secondary | ICD-10-CM | POA: Diagnosis not present

## 2016-12-31 DIAGNOSIS — N183 Chronic kidney disease, stage 3 unspecified: Secondary | ICD-10-CM

## 2016-12-31 MED ORDER — HYDRALAZINE HCL 10 MG PO TABS: ORAL_TABLET | ORAL | 0 refills | Status: DC

## 2016-12-31 NOTE — Assessment & Plan Note (Signed)
Creatinine is stable per baseline. Continue to avoid nephrotoxic agents. Continue losartan for renal protection. Continue aggressive blood pressure control.

## 2016-12-31 NOTE — Patient Instructions (Addendum)
We will keep your Sinemet the same. Please talk to your pharm D next time about potentially lowering the hydralazine and whether or not you should check BP every single day and multiple times a day.  Your BP dropped quite a bit today, by nearly 892 points systolic after you took the hydralazine this morning.  Continue exercising at home and we will forego the boxing class.   You can try Melatonin at night for sleep: take 1 mg to 3 mg, one to 2 hours before your bedtime. You can go up to 5 mg if needed. It is over the counter and comes in pill form, chewable form and spray, if you prefer.

## 2016-12-31 NOTE — Assessment & Plan Note (Signed)
Recent lipids unremarkable. Continue atorvastatin 40 mg.

## 2016-12-31 NOTE — Patient Instructions (Addendum)
Complete lab work prior to leaving today. I will notify you of your results once received.   We've decreased the hydralazine to 10 mg. Take one tablet 2-4 times daily for systolic blood pressure readings above 170. May repeat in 1 hour if no decrease in blood pressure. Please discuss this with the pharmacist.   Follow up in 6 months for re-evaluation or sooner if needed.  It was a pleasure to see you today!

## 2016-12-31 NOTE — Assessment & Plan Note (Signed)
Has had dizziness after taking hydralazine. Significant drop in blood pressure after taking hydralazine this morning, this was noted during his neurology appointment. Will reduce hydralazine to 10 mg 3 times a day when necessary for systolic blood pressure greater than 170 (parameters referenced by blood pressure clinic).   Will notify blood pressure clinic of changes. He and his wife will continue to monitor blood pressure and follow-up with the blood pressure clinic.

## 2016-12-31 NOTE — Assessment & Plan Note (Signed)
Repeat TSH pending today. Continue levothyroxine 100 mcg for now.

## 2016-12-31 NOTE — Progress Notes (Signed)
Subjective:    Patient ID: William [name], male    DOB: Aug 29, 1928, 83 y.o.   MRN: [id number]  HPI  William [name] is an 83 year old male who presents today for hospital follow up. He has a history of [name], paroxysmal atrial fibrillation, TIA, hypertension, hyperlipidemia, hypothyroidism.   He presented to [facility] on 12/[facility]/16 with a chief complaint of shortness of breath, palpitations, and dizziness. He was participating in a rock steady boxing class just prior to symptoms. He drove himself home and his wife called EMS for further evaluation. During his stay in the emergency department he was chest pain free, but noted to have elevated troponin levels with hypertension. He was admitted for further evaluation and treatment for NSTEMI.  During his hospital stay he was treated with IV Heparin. Troponin level trended upward so the decision was made to proceed with cardiac catheterization. Catheterization showed single vessel CAD and severe LVEDP, no intervention was made during procedure. No beta blocker was added given bradycardia. His blood pressure was difficult to control so his hydralazine was increased to 50 mg TID orally, then later transitioned to IV hydralazine. Amlodipine 2.5 mg was added to his outpatient regimen. He underwent echocardiogram with normal LVF with EF of 60-65% but severe LVH. His simvastatin was changed to atorvastatin 40 mg. He was discharged home on 12/05/16 with instructions for cardiology and PCP follow up.  Since discharge he's been evaluated by cardiology and neurology. He's being monitored in the blood pressure clinic for labile blood pressures that are presumed secondary to [name] with autonomic dysfunction. His amlodipine has recently increased to 5 mg and is instructed to take at bedtime. Losartan to be continued every morning (HCTZ removed), hydralazine reduced to 25 mg BID-QID PRN for SBP >170. He was evaluated by his neurologist today who  didn't recommend any medication changes for Parkinson's treatment as they increased his Sinemet dose last visit.   This morning his blood pressure was above goal at 196/107, he took hydralazine 25 mg with a drop to 98/56 during his neurology appointment. He did feel lightheaded with position changes. It was recommended his hydralazine be reduced. His BP in the office today is 144/64. He has resumed Eliquis 2.5 mg. His morning blood pressures are running 155-188/80's90's and evening blood pressures are running 170-180's/90's. The have noticed a a decrease in readings since taking Amlodipine at bedtime. He's been stretching and doing home exercises, gradually increasing strength. He denies chest pain, shortness of breath, dizziness now.    Review of Systems  Eyes: Negative for visual disturbance.  Respiratory: Negative for shortness of breath.   Cardiovascular: Negative for chest pain.  Neurological: Negative for dizziness, weakness and light-headedness.       Past Medical History:  Diagnosis Date  . A-fib ([place])   . Arthritis    "right knee" (08/09/2014)  . Basal cell carcinoma   . Benign prostatic hypertrophy with urinary obstruction   . Chronic anticoagulation 12/04/2016  . Complication of anesthesia    "difficulty intubation, had to use fiberoptic 2006"  . Difficult intubation   . DJD (degenerative joint disease)   . Gout   . Hx of colonic polyps   . Hyperlipidemia   . Hypertension    sees [name]  . Hypothyroidism   . Kidney tumor    "tumor on cyst on kidney"   . 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 Mini stroke ([place])   .  Parkinson disease ([place])   . Pneumonia    hx of in 1952  . Primary skin squamous cell carcinoma   . Subdural hematoma ([place]) 2006  . TIA (transient ischemic attack)    pt does not recall this hx "but may have had one today" (08/09/2014)     Social History   Social History  . Marital status: Widowed    Spouse name:  N/A  . Number of children: 3  . Years of education: 15   Occupational History  . retired    Social History Main Topics  . Smoking status: Former Smoker    Packs/day: 0.80    Years: 15.00    Types: Cigarettes    Quit date: 04/15/1965  . Smokeless tobacco: Never Used  . Alcohol use 3.0 oz/week    5 Glasses of wine per week     Comment: 1 glass of wine every night (6oz glass)  . Drug use: No  . Sexual activity: Yes   Other Topics Concern  . Not on file   Social History Narrative   Married.   Lives at home with significant other.   Retried. 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   Enjoys golfing, yard work, spending time with family.         Past Surgical History:  Procedure Laterality Date  . APPENDECTOMY    . BACK SURGERY    . CATARACT EXTRACTION W/ INTRAOCULAR LENS  IMPLANT, BILATERAL Bilateral ~ 1998  . CHOLECYSTECTOMY    . CRYOABLATION  07-10-12   "tumor on cyst on my kidney; had an ablation"  . EYE SURGERY    . INGUINAL HERNIA REPAIR Right   . IR GENERIC HISTORICAL  11/09/2015   IR RADIOLOGIST EVAL & MGMT 11/09/2015 [name], MD [facility] INTERV RAD  . IR RADIOLOGIST EVAL & MGMT  08/13/2016  . KIDNEY SURGERY    . KNEE ARTHROSCOPY Right   . LEFT HEART CATH AND CORONARY ANGIOGRAPHY N/A 12/05/2016   Procedure: LEFT HEART CATH AND CORONARY ANGIOGRAPHY;  Surgeon: [name], [name], MD;  Location: [place] [facility];  Service: Cardiovascular;  Laterality: N/A;  . LUMBAR LAMINECTOMY/DECOMPRESSION MICRODISCECTOMY  05/14/2012   Procedure: LUMBAR LAMINECTOMY/DECOMPRESSION MICRODISCECTOMY 1 LEVEL;  Surgeon: [name], MD;  Location: [place] [facility];  Service: Neurosurgery;  Laterality: Left;  Left Lumbar four-five Laminectomy/Diskectomy/Far lateral diskectomy  . SKIN CANCER EXCISION  "several"   "back of my neck; right eye; clavicle; nose"  . SUBDURAL HEMATOMA EVACUATION VIA CRANIOTOMY  2006  . THYROIDECTOMY     medullary carcinoma  . TONSILLECTOMY      Family History    Problem Relation Age of Onset  . Stroke Mother   . Stroke Father   . Heart disease Father   . Alzheimer's disease Brother   . Alzheimer's disease Sister     No Known Allergies  Current Outpatient Prescriptions on File Prior to Visit  Medication Sig Dispense Refill  . amLODipine (NORVASC) 5 MG tablet Take 1 tablet (5 mg total) by mouth daily. 90 tablet 3  . apixaban (ELIQUIS) 2.5 MG TABS tablet Take 1 tablet (2.5 mg total) by mouth 2 (two) times daily. 180 tablet 3  . atorvastatin (LIPITOR) 40 MG tablet Take 1 tablet (40 mg total) by mouth daily at 6 PM. 30 tablet 12  . carbidopa-levodopa (SINEMET IR) 25-100 MG tablet Take 1.5 tablets by mouth 4 (four) times daily. At 8, 12, 4 PM, and 8 PM 360 tablet 3  .  levothyroxine (SYNTHROID, LEVOTHROID) 100 MCG tablet TAKE 1 TABLET EVERY DAY BEFORE BREAKFAST 90 tablet 1  . losartan (COZAAR) 100 MG tablet Take 1 tablet (100 mg total) by mouth daily. 30 tablet 11  . Multiple Vitamin (MULTIVITAMIN WITH MINERALS) TABS Take 1 tablet by mouth daily. Reported on 08/01/2015    . nitroGLYCERIN (NITROSTAT) 0.4 MG SL tablet Place 1 tablet (0.4 mg total) under the tongue every 5 (five) minutes x 3 doses as needed for chest pain. 25 tablet 12  . Probiotic Product (ALIGN) 4 MG CAPS Take 1 capsule by mouth daily.    . rasagiline (AZILECT) 1 MG TABS tablet Take 1 tablet (1 mg total) by mouth daily. 90 tablet 3   No current facility-administered medications on file prior to visit.     BP (!) 144/64   Pulse (!) 53   Temp 98.1 F (36.7 C) (Oral)   Ht 6\' 4"  (1.93 m)   Wt 179 lb 1.9 oz (81.2 kg)   SpO2 92%   BMI 21.80 kg/m    Objective:   Physical Exam  Constitutional: He is oriented to person, place, and time. He appears well-nourished.  Neck: Neck supple.  Cardiovascular: Normal rate and regular rhythm.   Pulmonary/Chest: Effort normal and breath sounds normal.  Musculoskeletal:  Upper and lower extremity strength equal bilaterally.   Neurological: He  is alert and oriented to person, place, and time.  Skin: Skin is warm and dry.  Several healing skin patches to lower extremities from dermatology treatment last week.          Assessment & Plan:

## 2016-12-31 NOTE — Assessment & Plan Note (Signed)
No intervention with recent catheterization.  Continue hypertension control, continue statin.

## 2016-12-31 NOTE — Assessment & Plan Note (Signed)
Rate and rhythm regular today. Sinus bradycardia. Continue Eliquis.

## 2016-12-31 NOTE — Telephone Encounter (Signed)
Returned call to wife.  Pt woke today with BP 197/107. Took regular medications as well as hydralazine 25 mg (for systolic > 119).  About 2 hours later at neurologist office BP was 98/57.    Was then advised by PCP to drop hydralazine to 10 mg, but pt could cut 25 mg tabs in half for now.   Spoke with wife.  Agreed to have him cut hydralazine tabs to 12.5 mg for now.  Will have him also check BP about 2 hours after breakfast daily to see how much, if any, drop in BP with meals.   Wife voiced understanding, will contact again in a few days with questions/concerns.

## 2016-12-31 NOTE — Progress Notes (Signed)
Subjective:    Patient ID: William [name] is a 83 y.o. male.  HPI     Interim history:  William [name] is an 83 year old right-handed gentleman with an underlying medical history of right TIA in January 2003, SDH (s/p left craniotomy in August 2006), hypertension, hypothyroidism, thyroid cancer, partial onset seizures (off Dilantin), lumbar spine disease, status post lower back surgery at L4-5 in January 2014, renal tumor, status post kidney tumor ablative surgery in March 2014, who presents for followup consultation of his right-sided predominant [name]. He is accompanied by William [name] (longterm GF) again today. I last saw him on 06/27/2016, at which time he reported doing well, he had a trip to [place] in [place] planned for springtime. He had no recent falls. He was supposed to be on a smaller dose of Eliquis, namely 2.5 mg twice a day. He continued to participate in the boxing class for [name] and felt that they were helpful.  Today, 12/31/2016 (all dictated new, as well as above notes, some dictation done in note pad or Word, outside of chart, may appear as copied):  He reports doing okay. She has some intermittent lightheadedness. Today at 7:30 in the morning his blood pressure was rather high, 196/107, he took hydralazine and currently his blood pressure is rather low, has dropped by almost 100 points in the systolic number. He does have lightheadedness upon standing. He has an appointment in a couple of weeks with the Pharm.D. through cardiology and follow-up with cardiology in November. Unfortunately, he suffered a interim heart attack and was hospitalized in August for this. I reviewed his discharge summary, he was in the hospital from 12/03/2016 through 12/05/2016. He was diagnosed with non-STEMI, managed conservatively. He was at the rock steady boxing at the time of his chest pain, he was noted by the trainer to not look well. He went home and [name] called 911. He is  doing well. He had a heart cath and did not need any intervention. He has put the boxing class on hold. He is trying to do exercises at home.    The patient's allergies, current medications, family history, past medical history, past social history, past surgical history and problem list were reviewed and updated as appropriate.   Previously (copied from previous notes for reference):   I saw him on 03/05/2016, at which time he reported doing okay. He was during his stay active, was participating in [place] steady boxing, which he felt was helpful. He was avoiding strenuous parts of the exercise. He was interested in participating in a new drug study at [facility]. This was to investigate apomorphine sublingual to decrease off time. He was on Azilect once daily. He was taking Sinemet 4 times a day. He was trying to hydrate well, reported no recent falls. I suggested he increase the Sinemet to 1-1/2 pills 4 times a day. I suggested he continue with generic Azilect once daily.    I saw him on 10/26/2015, at which time he reported doing quite well, had some stumbling episodes, no actual falls, was trying to drink enough water and was still physically active. He had signed up for rock steady boxing classes but William [name] noted that he would get too exhausted.   I saw him on 06/22/2015, at which time he reported doing fairly well. He had finished outpatient OT, PT, and ST. Unfortunately, he did take a fall in the garage couple of months prior as he was coming in from the graduate, stepped  backwards on the stairs and slipped off falling backwards, landed on his behind, head struck the car, he denied loss of consciousness or headache or bruise and no sequelae were reported. He was having rails installed in the garage entrance. Was still trying to stay active, playing golf. We mutually agreed to continue with Sinemet 4 times a day and Azilect once daily. We talked about fall risk and gait safety and fall precautions.    I saw him on 02/23/2015, at which time he reported doing fairly well. He was playing golf regularly. His right knee was bothering him. He had no recent falls. He was trying to hydrate well enough. He had no new issues with A. fib and no new complaints otherwise. I noted right knee swelling. He was advised to follow-up with his primary care physician for this. We mutually agreed to keep his Parkinson's medications the same.    I saw him on 11/01/2014 at which time he reported a recent diagnosis with A. fib. He was admitted to the hospital in April. I reviewed the hospital records from 07/17/2014 through 07/18/2014. He was started on Xarelto. He was then re-admitted on 08/09/14 to 08/10/14 due to altered mental status and slurring of speech and was suspected to have a TIA. He was seen by neuro in consultation and a baby aspirin was added to Xarelto. He presented back to the ER on 08/18/14. He a CTH wo contrast on  08/18/2014 , which was negative for any acute changes. He was seen by the Neurologist in the emergency room and it was felt that a full TIA workup was not necessary at the time. He had presented with hypertension and some slurring of speech. He was switched from Xarelto to Eliquis, because [facility]  He endorsed recent stressors, including the recent passing of his younger brother a week prior with advanced Alzheimer's disease.    He had an MRI brain and MRA head on 08/09/14: MRI HEAD: No acute intracranial process, specifically no acute ischemia. Involutional changes. Mild white matter changes suggest chronic small vessel ischemic disease. MRA HEAD: No acute large vessel occlusion or high-grade stenosis. Mid grade stenosis RIGHT P2/3 segment.    I suggested we continue with Sinemet at 4 times a day. He was advised to continue with Azilect as well. He was advised to drink more water. In the interim, we restarted outpatient physical therapy, occupational therapy and speech  therapy.   I saw him on 04/29/2014, at which time he reported doing well overall but he was more fatigued and had some excessive daytime somnolence. Memory was stable. His mood was stable. He noted some blood pressure fluctuations. Sometimes he had a dull headache and sometimes he felt lightheaded. He had some anxiety over his lady friend's health, as she had fallen and broken rib. He continued to walk regularly and played golf. He had no recent falls. I asked him to continue with his medications, Azilect once daily and Sinemet 4 times a day.   I saw him on 10/28/2013, at which time he reported doing well. In particular, had no cognitive complaints, no mood issues, and continued to play golf 3 times a week. He was driving well. I suggested an increase in his levodopa to 1 pill 4 times a day of the 25-100 milligrams strength. He had [facility] seeing a [facility] primary [facility] and had seen a [facility] neurologist as well.      I saw him   on 12/07/2012, at which time I felt that he was doing well on Azilect and levodopa. He called in December 2014 with problems with blood pressure fluctuations. He was wondering if this came from the [place] but I did not think it was due to the Azilect per se. I was reluctant to take him off of it.    I first met him on 06/03/2012 and he previously followed by Dr. Morene Antu. He has had primarily right-sided symptoms with regards to his Parkinson's, diagnosed in 2010 with symptoms dating back to late 2009 or early 2010. He had briefly tried Mirapex but was taken off d/t hypotension. L spine MRI in June 2013 showed renal cysts, degenerative joint disease most prominent at L4-5. He tried acupuncture. His MRI of the lumbar spine showed abnormalities with his kidney with a cyst and a tumor and he had ablative surgery on 07/10/12 and had a FU CT done. He had lower back surgery on 05/14/12.  I saw him back on 09/03/2012 and I suggested starting Azilect. He has been tolerating it well and both  he and his girlfriend felt that he did better with it in terms of dexterity and fine motor control. He had some hypotension and lightheadedness and reduced his BP medication. He has been having some issue with gout.     His Past Medical History Is Significant For: Past Medical History:  Diagnosis Date  . A-fib ([place])   . Arthritis    "right knee" (08/09/2014)  . Basal cell carcinoma   . Benign prostatic hypertrophy with urinary obstruction   . Chronic anticoagulation 12/04/2016  . Complication of anesthesia    "difficulty intubation, had to use fiberoptic 2006"  . Difficult intubation   . DJD (degenerative joint disease)   . Gout   . Hx of colonic polyps   . Hyperlipidemia   . Hypertension    sees [name]  . Hypothyroidism   . Kidney tumor    "tumor on cyst on kidney"   . 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 Mini stroke ([place])   . Parkinson disease ([place])   . Pneumonia    hx of in 1952  . Primary skin squamous cell carcinoma   . Subdural hematoma ([place]) 2006  . TIA (transient ischemic attack)    pt does not recall this hx "but may have had one today" (08/09/2014)    His Past Surgical History Is Significant For: Past Surgical History:  Procedure Laterality Date  . APPENDECTOMY    . BACK SURGERY    . CATARACT EXTRACTION W/ INTRAOCULAR LENS  IMPLANT, BILATERAL Bilateral ~ 1998  . CHOLECYSTECTOMY    . CRYOABLATION  07-10-12   "tumor on cyst on my kidney; had an ablation"  . EYE SURGERY    . INGUINAL HERNIA REPAIR Right   . IR GENERIC HISTORICAL  11/09/2015   IR RADIOLOGIST EVAL & MGMT 11/09/2015 [name], MD [facility] INTERV RAD  . IR RADIOLOGIST EVAL & MGMT  08/13/2016  . KIDNEY SURGERY    . KNEE ARTHROSCOPY Right   . LEFT HEART CATH AND CORONARY ANGIOGRAPHY N/A 12/05/2016   Procedure: LEFT HEART CATH AND CORONARY ANGIOGRAPHY;  Surgeon: [name], [name], MD;  Location: [facility];  Service: Cardiovascular;  Laterality: N/A;  .  LUMBAR LAMINECTOMY/DECOMPRESSION MICRODISCECTOMY  05/14/2012   Procedure: LUMBAR LAMINECTOMY/DECOMPRESSION MICRODISCECTOMY 1 LEVEL;  Surgeon: [name], MD;  Location: [facility];  Service: Neurosurgery;  Laterality: Left;  Left Lumbar four-five Laminectomy/Diskectomy/Far lateral diskectomy  . SKIN CANCER EXCISION  "several"   "back of my neck; right eye; clavicle; nose"  . SUBDURAL HEMATOMA EVACUATION VIA CRANIOTOMY  2006  . THYROIDECTOMY     medullary carcinoma  . TONSILLECTOMY      His Family History Is Significant For: Family History  Problem Relation Age of Onset  . Stroke Mother   . Stroke Father   . Heart disease Father   . Alzheimer's disease Brother   . Alzheimer's disease Sister     His Social History Is Significant For: Social History   Social History  . Marital status: Widowed    Spouse name: N/A  . Number of children: 3  . Years of education: 15   Occupational History  . retired    Social History Main Topics  . Smoking status: Former Smoker    Packs/day: 0.80    Years: 15.00    Types: Cigarettes    Quit date: 04/15/1965  . Smokeless tobacco: Never Used  . Alcohol use 3.0 oz/week    5 Glasses of wine per week     Comment: 1 glass of wine every night (6oz glass)  . Drug use: No  . Sexual activity: Yes   Other Topics Concern  . None   Social History Narrative   Married.   Lives at home with significant other.   Retried. 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   Enjoys golfing, yard work, spending time with family.         His Allergies Are:  No Known Allergies:   His Current Medications Are:  Outpatient Encounter Prescriptions as of 12/31/2016  Medication Sig  . amLODipine (NORVASC) 5 MG tablet Take 1 tablet (5 mg total) by mouth daily.  Marland Kitchen apixaban (ELIQUIS) 2.5 MG TABS tablet Take 1 tablet (2.5 mg total) by mouth 2 (two) times daily.  Marland Kitchen atorvastatin (LIPITOR) 40 MG tablet Take 1 tablet (40 mg total) by mouth daily at 6 PM.  .  carbidopa-levodopa (SINEMET IR) 25-100 MG tablet Take 1.5 tablets by mouth 4 (four) times daily. At 8, 12, 4 PM, and 8 PM  . hydrALAZINE (APRESOLINE) 25 MG tablet Take 1 tablet (25 mg total) by mouth 3 (three) times daily as needed.  Marland Kitchen levothyroxine (SYNTHROID, LEVOTHROID) 100 MCG tablet TAKE 1 TABLET EVERY DAY BEFORE BREAKFAST  . losartan (COZAAR) 100 MG tablet Take 1 tablet (100 mg total) by mouth daily.  . Multiple Vitamin (MULTIVITAMIN WITH MINERALS) TABS Take 1 tablet by mouth daily. Reported on 08/01/2015  . nitroGLYCERIN (NITROSTAT) 0.4 MG SL tablet Place 1 tablet (0.4 mg total) under the tongue every 5 (five) minutes x 3 doses as needed for chest pain.  . Probiotic Product (ALIGN) 4 MG CAPS Take 1 capsule by mouth daily.  . rasagiline (AZILECT) 1 MG TABS tablet Take 1 tablet (1 mg total) by mouth daily.   No facility-administered encounter medications on file as of 12/31/2016.   :  Review of Systems:  Out of a complete 14 point review of systems, all are reviewed and negative with the exception of these symptoms as listed below: Review of Systems  Neurological:       Pt presents today to follow up on his PD. Pt reports that his BP has been fluctuating. His BP this morning was 190s/100s and he took a hydralazine and now his BP is lower.    Objective:  Neurological Exam  Physical Exam Physical  Examination:   Vitals:   12/31/16 0937  BP: (!) 98/57  Pulse: (!) 47    General Examination: The patient is a very pleasant 83 y.o. male in no acute distress. He appears well-developed and well-nourished and well groomed. Mild Lightheadedness upon standing.  HEENT: Normocephalic, atraumatic, pupils are equal, round and reactive to light and accommodation. Extraocular tracking shows mild saccadic breakdown. Speech is moderately hypophonic and mildly dysarthric. Hearing is grossly intact. Oropharynx exam reveals mild mouth dryness, tongue protrudes centrally and palate elevates symmetrically.  No other abnormal findings, he has moderate nuchal rigidity and decrease in passive range of motion.  Chest: Clear to auscultation without wheezing, rhonchi or crackles noted.  Heart: S1+S2+0, regular and normal without murmurs, rubs or gallops noted.   Abdomen: Soft, non-tender and non-distended with normal bowel sounds appreciated on auscultation.  Extremities: There is no pitting edema in the distal lower extremities bilaterally. Pedal pulses are intact.  Skin: Warm and dry without trophic changes noted. Mild bruising on hands.  Musculoskeletal: exam reveals no obvious joint deformities, tenderness or joint swelling or erythema.   Neurologically:  Mental status: The patient is awake, alert and oriented in all 4 spheres. His immediate and remote memory, attention, language skills and fund of knowledge are appropriate. There is no evidence of aphasia, agnosia, apraxia or anomia. Speech is clear with normal prosody and enunciation. Thought process is linear. Mood is normal and affect is normal.  Cranial nerves II - XII are as described above under HEENT exam.  Motor exam: Normal bulk, and strength for age. He has increased tone in the right more than left upper and lower extremities. He has an intermittent resting tremor in both upper extremities. Fine motor skills are moderately impaired on the right and slightly better on the left. Reflexes are 1+ throughout. He stands up with mild difficulty and posture is a little more stooped today. He walks with decreased stride length and decrease pace, decreased arm swing and more insecurely today. Sensory exam is intact to light touch. Cerebellar testing shows no additional concern for dysmetria or intention tremor or gait ataxia.  Assessment and Plan:    In summary, William [name] is a very pleasant 83 year old male with An underlying medical history of right TIA in January 2003, SDH (s/p left craniotomy in August 2006), hypertension,  hypothyroidism, thyroid cancer, partial onset seizures (off Dilantin), lumbar spine disease, status post lower back surgery at L4-5 in January 2014, renal tumor, status post kidney tumor ablative surgery in March 2014, who presents for followup consultation of his right-sided predominant [name], Complicated by a recent heart attack, more frailty with time, advancing age. He was diagnosed in 2010, symptoms date back to late 2009. He has remained fairly stable over the years but had some progression with time as expected. He benefited from an increase in his Sinemet last time. I suggested we keep his medications the same, he  fills his prescriptions at the [place]. He he had a low blood pressure values today, lower than is typical and also took hydralazine this morning after noticing a very high blood pressure. He is advised to discuss with his cardiologist and Pharm.D. with her he could take a smaller dose of hydralazine as he drops quite significantly after taking it. Part of this problem may be autonomic dysfunction in the context of [name]. For low blood pressure values he is advised to use compression socks and stay well-hydrated. He did not need prescriptions today.  I suggested a four-month recheck with me. I answered all their questions today and the patient and William [name] for in agreement.  I spent 25 minutes in total face-to-face time with the patient, more than 50% of which was spent in counseling and coordination of care, reviewing test results, reviewing medication and discussing or reviewing the diagnosis of PD, its prognosis and treatment options. Pertinent laboratory and imaging test results that were available during this visit with the patient were reviewed by me and considered in my medical decision making (see chart for details).

## 2016-12-31 NOTE — Addendum Note (Signed)
Addended by: Jacqualin Combes on: 12/31/2016 05:45 PM   Modules accepted: Orders

## 2016-12-31 NOTE — Assessment & Plan Note (Signed)
Recent follow-up with neurologist this morning, no changes to medications. Note reviewed.

## 2016-12-31 NOTE — Assessment & Plan Note (Signed)
Admitted and treated for NSTEMI on 12/03/16. Underwent cardiac catheterization, no further intervention during procedure. Left ventricular end-diastolic pressures were markedly elevated, history of labile hypertension.  Overall doing well, no chest pain. Has had dizziness after taking hydralazine. Significant drop in blood pressure after taking hydralazine this morning, this was noted during his neurology appointment. Will reduce hydralazine to 10 mg 3 times a day when necessary for systolic blood pressure greater than 170 (parameters referenced by blood pressure clinic).   Discussed to advance activity as tolerated gradually. Continue home stretching and exercises. Continue atorvastatin, hypertension management.  All Hospital notes, imaging, labs reviewed.

## 2017-01-01 LAB — TSH: TSH: 2.78 u[IU]/mL (ref 0.35–4.50)

## 2017-01-06 ENCOUNTER — Telehealth: Payer: Self-pay | Admitting: Neurology

## 2017-01-06 DIAGNOSIS — G20 Parkinson's disease: Secondary | ICD-10-CM

## 2017-01-06 MED ORDER — CARBIDOPA-LEVODOPA 25-100 MG PO TABS: 1.5000 | ORAL_TABLET | Freq: Four times a day (QID) | ORAL | 3 refills | Status: DC

## 2017-01-06 NOTE — Telephone Encounter (Signed)
Pt calling for a refill of carbidopa-levodopa (SINEMET IR) 25-100 MG tablet.  Pt using  [facility] - [place], [place]. [phone] (Phone) [phone] (Fax)

## 2017-01-06 NOTE — Telephone Encounter (Signed)
Sinemet refilled, sent to [facility]. Received a receipt of confirmation.

## 2017-01-06 NOTE — Addendum Note (Signed)
Addended by: Lester [place]ville A on: 01/06/2017 02:47 PM   Modules accepted: Orders

## 2017-01-14 ENCOUNTER — Encounter: Payer: Self-pay | Admitting: Pharmacist Clinician (PhC)/ Clinical Pharmacy Specialist

## 2017-01-14 ENCOUNTER — Ambulatory Visit (INDEPENDENT_AMBULATORY_CARE_PROVIDER_SITE_OTHER): Payer: Medicare HMO | Admitting: Pharmacist Clinician (PhC)/ Clinical Pharmacy Specialist

## 2017-01-14 DIAGNOSIS — I10 Essential (primary) hypertension: Secondary | ICD-10-CM | POA: Diagnosis not present

## 2017-01-14 DIAGNOSIS — E7849 Other hyperlipidemia: Secondary | ICD-10-CM | POA: Diagnosis not present

## 2017-01-14 MED ORDER — SIMVASTATIN 40 MG PO TABS: 40.0000 mg | ORAL_TABLET | Freq: Every day | ORAL | 6 refills | Status: DC

## 2017-01-14 NOTE — Patient Instructions (Signed)
We will see if we can get your insurance to cover the 24 hour BP monitor.  If so, they will give it to you at our [facility] office.  I will call you late next week with that information.  Your blood pressure today is 172/84  Check your blood pressure at home twice daily and keep record of the readings.  Take your BP meds as follows:  Take 10 mg hydralazine every night unless BP is < 130/60   Stop atorvastatin.  Wait about 1 week and start your simvastatin at 40 mg each night.  If this causes you to have hip pain, please stop and let me know at our next meeting   Bring all of your meds, your BP cuff and your record of home blood pressures to your next appointment.  Exercise as you're able, try to walk approximately 30 minutes per day.  Keep salt intake to a minimum, especially watch canned and prepared boxed foods.  Eat more fresh fruits and vegetables and fewer canned items.  Avoid eating in fast food restaurants.    HOW TO TAKE YOUR BLOOD PRESSURE: . Rest 5 minutes before taking your blood pressure. .  Don't smoke or drink caffeinated beverages for at least 30 minutes before. . Take your blood pressure before (not after) you eat. . Sit comfortably with your back supported and both feet on the floor (don't cross your legs). . Elevate your arm to heart level on a table or a desk. . Use the proper sized cuff. It should fit smoothly and snugly around your bare upper arm. There should be enough room to slip a fingertip under the cuff. The bottom edge of the cuff should be 1 inch above the crease of the elbow. . Ideally, take 3 measurements at one sitting and record the average.

## 2017-01-14 NOTE — Assessment & Plan Note (Signed)
Patient noting joint pain in his hips since starting atorvastatin 40 mg.  Have asked him to d/c for now and restart simvastatin at a higher dose (40 mg) after several days without medication.  He is to let us know if he can tolerate that dose.  If so we will repeat lipid labs in 8-12 weeks

## 2017-01-14 NOTE — Assessment & Plan Note (Signed)
Patient with varied blood pressures, over 100 points difference between highs and lows.   For now I will have him continue his current regimen, with the addition of hydralazine 10 mg each night at bedtime.  He will also continue to take 10 mg prn for BP > 479 systolic.  I am going to look into a 24 hour BP monitor to see if we can get a better picture of his daily variations.  We could consider Northera to help with BP drops, however I would like to learn more about the concern for patients with ischemic heart disease before prescribing.  I also worry about risk of increased supine hypertension, as his early morning readings seem to be the highest.    Will call patient next week once I've determined if we can get the 24 hr cuff covered by his insurance.

## 2017-01-14 NOTE — Progress Notes (Signed)
01/14/2017 [name] 05/26/1928 [id number]   HPI:  William [name] is a 83 y.o. male patient of Dr Percival Spanish, with a PMH below who presents today for hypertension clinic evaluation.   He was seen by Kerin Ransom last week with concerns of chronic labile hypertension.  His medical history is significant for PAF (on Eliquis), chronic renal insufficiency (stage 3), RBBB and [name].  He was admitted [facility] on Aug 21 with a NSTEMI and discharged 2 days later.  He is feeling well today and has no complaints other than his varying blood pressure readings.    Today he returns with more home BP readings.  Since his last visit we cut the prn hydralazine dose from 25 mg to 10 mg, because he was getting to many orthostatic readings.  Now that he is on just 10 mg prn, he has only had one reading at < 956 systolic and 15 or 36 readings were < 213 systolic (our current goal).   Admits to occasional dizziness or orthostatic symptoms, but is very careful to change positions slowly to avoid problems.    He does note today that he has been having hip pain and believes it is because of the atorvastatin.  He would like to go back on the simvastatin.   Blood Pressure Goal:  150/80 (due to Parkinson's, age and wide range of current home readings)  Current Medications:  Amlodipine 5 mg qpm  Losartan 100 mg qam  Hydralazine 10 mg prn SBP > 180 (takes 2-4 doses per day)   Family Hx:  Father had stroke at 16 (died); mother from [place] at 71  Sister 32, brother 86 both from [place]  2 of 3 sons with hypertension (both in 72's)  Social Hx:  No tobacco, quit 52 years ago; glass of wine most nights (red); 1 coffee/tea daily Diet:  Mostly home cooked meals (esp since MI); no added salt; mostly chicken and fish, occasional beef (taco); fresh fruits throughout summer; stir fry veggies with olive oil; enjoys sweets - ice cream, cookies;   Exercise:  [employer] was twice weekly  (strenuous therapy designed for Parkinson's patients to help with mobility, balance and switching from task to task), stopped at MI; some stretching, stationary bike at home  Home BP readings:  Home cuff 81 years old Medline brand, read within 5 points of office cuff.  Readings still varied from 78/55 to 199/114.  Average was 158/87, but first reading of the day average was 176/95.  Wife has also noted that his pressure drops significantly after meals.   Intolerances:   NKDA  CrCl cannot be calculated (Patient's most recent lab result is older than the maximum 21 days allowed.).  Wt Readings from Last 3 Encounters:  12/31/16 179 lb 1.9 oz (81.2 kg)  12/31/16 178 lb (80.7 kg)  12/12/16 178 lb 6.4 oz (80.9 kg)   BP Readings from Last 3 Encounters:  12/31/16 (!) 144/64  12/31/16 (!) 98/57  12/18/16 128/66   Pulse Readings from Last 3 Encounters:  12/31/16 (!) 53  12/31/16 (!) 47  12/18/16 (!) 48    Current Outpatient Prescriptions  Medication Sig Dispense Refill  . amLODipine (NORVASC) 5 MG tablet Take 1 tablet (5 mg total) by mouth daily. 90 tablet 3  . apixaban (ELIQUIS) 2.5 MG TABS tablet Take 1 tablet (2.5 mg total) by mouth 2 (two) times daily. 180 tablet 3  . carbidopa-levodopa (SINEMET IR) 25-100 MG tablet Take 1.5  tablets by mouth 4 (four) times daily. At 8, 12, 4 PM, and 8 PM 360 tablet 3  . hydrALAZINE (APRESOLINE) 10 MG tablet Take 1 tablet by mouth up to three times daily for systolic blood pressure readings above 170. 90 tablet 0  . levothyroxine (SYNTHROID, LEVOTHROID) 100 MCG tablet TAKE 1 TABLET EVERY DAY BEFORE BREAKFAST 90 tablet 1  . losartan (COZAAR) 100 MG tablet Take 1 tablet (100 mg total) by mouth daily. 30 tablet 11  . Multiple Vitamin (MULTIVITAMIN WITH MINERALS) TABS Take 1 tablet by mouth daily. Reported on 08/01/2015    . nitroGLYCERIN (NITROSTAT) 0.4 MG SL tablet Place 1 tablet (0.4 mg total) under the tongue every 5 (five) minutes x 3 doses as needed for  chest pain. 25 tablet 12  . Probiotic Product (ALIGN) 4 MG CAPS Take 1 capsule by mouth daily.    . rasagiline (AZILECT) 1 MG TABS tablet Take 1 tablet (1 mg total) by mouth daily. 90 tablet 3  . simvastatin (ZOCOR) 40 MG tablet Take 1 tablet (40 mg total) by mouth at bedtime. 30 tablet 6   No current facility-administered medications for this visit.     No Known Allergies  Past Medical History:  Diagnosis Date  . A-fib ([place])   . Arthritis    "right knee" (08/09/2014)  . Basal cell carcinoma   . Benign prostatic hypertrophy with urinary obstruction   . Chronic anticoagulation 12/04/2016  . Complication of anesthesia    "difficulty intubation, had to use fiberoptic 2006"  . Difficult intubation   . DJD (degenerative joint disease)   . Gout   . Hx of colonic polyps   . Hyperlipidemia   . Hypertension    sees [name]  . Hypothyroidism   . Kidney tumor    "tumor on cyst on kidney"   . 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 Mini stroke ([place])   . Parkinson disease ([place])   . Pneumonia    hx of in 1952  . Primary skin squamous cell carcinoma   . Subdural hematoma ([place]) 2006  . TIA (transient ischemic attack)    pt does not recall this hx "but may have had one today" (08/09/2014)    There were no vitals taken for this visit.  Standing  Pressure 120/64  Essential hypertension Patient with varied blood pressures, over 100 points difference between highs and lows.   For now I will have him continue his current regimen, with the addition of hydralazine 10 mg each night at bedtime.  He will also continue to take 10 mg prn for BP > 573 systolic.  I am going to look into a 24 hour BP monitor to see if we can get a better picture of his daily variations.  We could consider Northera to help with BP drops, however I would like to learn more about the concern for patients with ischemic heart disease before prescribing.  I also worry about risk of increased  supine hypertension, as his early morning readings seem to be the highest.    Will call patient next week once I've determined if we can get the 24 hr cuff covered by his insurance.   HLD (hyperlipidemia) Patient noting joint pain in his hips since starting atorvastatin 40 mg.  Have asked him to d/c for now and restart simvastatin at a higher dose (40 mg) after several days without medication.  He is to let us know if  he can tolerate that dose.  If so we will repeat lipid labs in 8-12 weeks   Tommy Medal PharmD CPP [place]

## 2017-02-03 ENCOUNTER — Other Ambulatory Visit: Payer: Self-pay | Admitting: Primary Care

## 2017-02-03 DIAGNOSIS — E039 Hypothyroidism, unspecified: Secondary | ICD-10-CM

## 2017-02-05 DIAGNOSIS — Z85828 Personal history of other malignant neoplasm of skin: Secondary | ICD-10-CM | POA: Diagnosis not present

## 2017-02-05 DIAGNOSIS — L905 Scar conditions and fibrosis of skin: Secondary | ICD-10-CM | POA: Diagnosis not present

## 2017-02-05 DIAGNOSIS — Z961 Presence of intraocular lens: Secondary | ICD-10-CM | POA: Diagnosis not present

## 2017-02-05 DIAGNOSIS — H524 Presbyopia: Secondary | ICD-10-CM | POA: Diagnosis not present

## 2017-02-13 ENCOUNTER — Telehealth: Payer: Self-pay | Admitting: Pharmacist Clinician (PhC)/ Clinical Pharmacy Specialist

## 2017-02-13 NOTE — Telephone Encounter (Signed)
Spoke with William [name] - patient having good BP during day after meals, down to 240'X systolic.  However at night and in am still getting readings consistently 190/100 range.  Take amlodipine 5 mg, losartan 100 mg and hydralazine 10 mg prn.  Has been taking hydralazine most nights.    Will start paperwork/prior authorization for Northera.  In the meantime advised patient to increase hydralazine to 20 mg nightly and let us know if there is any improvement in his BP

## 2017-02-17 ENCOUNTER — Telehealth: Payer: Self-pay | Admitting: Pharmacist Clinician (PhC)/ Clinical Pharmacy Specialist

## 2017-02-17 NOTE — Telephone Encounter (Signed)
Spoke with wife - she notes that his BP has not improved any since adding amlodipine and wants to d/c it for now.    Agreed that he can hold it for now, and they are going to divide his losartan to 50 mg bid.  She believes his BP drops are more from the medications "dumping" into his system.   Explained that they can try this, but if he continues to run 583-094 systolic we may need to add it back in.    Also suggested that we may need to try midodrine at meals to help with drops (usually to 100-120 after meals).  They don't want to try this just yet.

## 2017-02-24 ENCOUNTER — Telehealth: Payer: Self-pay | Admitting: Pharmacist Clinician (PhC)/ Clinical Pharmacy Specialist

## 2017-02-24 NOTE — Telephone Encounter (Signed)
Patient significant other [name]) called,  BP still running as high as 903 systolic at night and in am, however has stopped dropping so low after meals.  Previously was going to 009 systolic after meals, however this last week only dropped to approx 233 systolic.     Nothera is a consideration should his BP start to drop post-prandial again, however insurance would like trial of midodrine first.  Because he has not had problems this week, [name] is hesitant to try.   For now he is to increase the amlodipine to 7.5 mg daily at 6 pm and take 20 mg hydralazine each evening and 10 mg each morning.  He should only hold for BP <007 systolic.   [name] voiced understanding that it can take several days to see the effect of dose changes of amlodipine.

## 2017-03-03 DIAGNOSIS — M109 Gout, unspecified: Secondary | ICD-10-CM | POA: Insufficient documentation

## 2017-03-03 DIAGNOSIS — E785 Hyperlipidemia, unspecified: Secondary | ICD-10-CM | POA: Insufficient documentation

## 2017-03-03 DIAGNOSIS — D49519 Neoplasm of unspecified behavior of unspecified kidney: Secondary | ICD-10-CM | POA: Insufficient documentation

## 2017-03-03 DIAGNOSIS — G20 Parkinson's disease: Secondary | ICD-10-CM | POA: Insufficient documentation

## 2017-03-03 DIAGNOSIS — E039 Hypothyroidism, unspecified: Secondary | ICD-10-CM | POA: Insufficient documentation

## 2017-03-03 DIAGNOSIS — I639 Cerebral infarction, unspecified: Secondary | ICD-10-CM | POA: Insufficient documentation

## 2017-03-03 DIAGNOSIS — J189 Pneumonia, unspecified organism: Secondary | ICD-10-CM | POA: Insufficient documentation

## 2017-03-03 DIAGNOSIS — C4492 Squamous cell carcinoma of skin, unspecified: Secondary | ICD-10-CM | POA: Insufficient documentation

## 2017-03-03 DIAGNOSIS — M1990 Unspecified osteoarthritis, unspecified site: Secondary | ICD-10-CM | POA: Insufficient documentation

## 2017-03-03 DIAGNOSIS — Z86010 Personal history of colonic polyps: Secondary | ICD-10-CM | POA: Insufficient documentation

## 2017-03-03 DIAGNOSIS — I10 Essential (primary) hypertension: Secondary | ICD-10-CM | POA: Insufficient documentation

## 2017-03-03 DIAGNOSIS — G459 Transient cerebral ischemic attack, unspecified: Secondary | ICD-10-CM | POA: Insufficient documentation

## 2017-03-03 DIAGNOSIS — T8859XA Other complications of anesthesia, initial encounter: Secondary | ICD-10-CM | POA: Insufficient documentation

## 2017-03-03 DIAGNOSIS — G20A1 Parkinson's disease without dyskinesia, without mention of fluctuations: Secondary | ICD-10-CM | POA: Insufficient documentation

## 2017-03-03 DIAGNOSIS — C4491 Basal cell carcinoma of skin, unspecified: Secondary | ICD-10-CM | POA: Insufficient documentation

## 2017-03-03 DIAGNOSIS — T884XXA Failed or difficult intubation, initial encounter: Secondary | ICD-10-CM | POA: Insufficient documentation

## 2017-03-03 DIAGNOSIS — T4145XA Adverse effect of unspecified anesthetic, initial encounter: Secondary | ICD-10-CM | POA: Insufficient documentation

## 2017-03-03 DIAGNOSIS — I4891 Unspecified atrial fibrillation: Secondary | ICD-10-CM | POA: Insufficient documentation

## 2017-03-13 NOTE — Progress Notes (Signed)
Cardiology Office Note   Date:  03/14/2017   ID:  [name], DOB Nov 25, 1928, MRN [id number]  PCP:  Pleas Koch, NP  Cardiologist:   Minus Breeding, MD   Chief Complaint  Patient presents with  . Dizziness      History of Present Illness: William [name] is a 83 y.o. male who presents for evaluation of atrial fibrillation.  He has had paroxysmal atrial fibrillation. He's had a TIA as well.  He was admitted in August of this year with hypertension and elevated troponin.  Cardiac catheterization demonstrated 99% septal perforator and 50% LAD stenosis.  His ejection fraction was well preserved.  He did have an elevated EDP.  He was managed medically.  We have seen him for med titration and has had some labile blood pressures.  This has been exacerbated by his [name] and dysautonomia.  At the last phone call he was having significant hypertension but it was dropping considerably after eating.  His amlodipine was increased  and at 6 PM he was told to take 20 mg of hydralazine.  He was to take 10 mg in the morning holding if his systolic blood pressure was less than 140.  He returns for follow-up of this.    There are some trends including his blood pressure may be going up when he drinks a glass of red wine at night.  He also has a low blood pressure when he eats.  He will know when he is lightheaded that his blood pressure is low.  He turns red when the BP is high.  who was admitted 12/03/16 for palpitations and elevated B/P and was found to have an elevated troponin POC- 1.66. The pt was at a Parkinson's exercise class on the morning of admission when he noted tachycardia. He went home and his wife took his B/P which was elevated. They waited 10 minutes to see if it would come down ( he has long history of labile B/P). When it didn't they went to the ED. The pt denies he had any chest pain or SOB. In the ED he was in NSR, his BNP was normal. His POC was elevated and  subsequent Troponin I was also elevated-8.33. He was admitted as a NSTEMI. Cath done 8/23 revealed 99% septal perforator and 50% LAD. His LVEDP was elevated at cath. Echo done 12/04/16 showed an EF of 60-65% with severe LVH and elevated LVEDP and LA pressure.   I saw him last year.  Since I last saw him he has done well.  The patient denies any new symptoms such as chest discomfort, neck or arm discomfort. There has been no new shortness of breath, PND or orthopnea. There have been no reported palpitations, presyncope or syncope.  He is doing some boxing to help with his Parkinsons.  They do note that with this activity at times his heart rate goes up but it comes back down quickly.    Past Medical History:  Diagnosis Date  . A-fib ([place])   . Arthritis    "right knee" (08/09/2014)  . Basal cell carcinoma   . Benign prostatic hypertrophy with urinary obstruction   . Chronic anticoagulation 12/04/2016  . Complication of anesthesia    "difficulty intubation, had to use fiberoptic 2006"  . Difficult intubation   . DJD (degenerative joint disease)   . Gout   . Hx of colonic polyps   . Hyperlipidemia   . Hypertension    sees  [name]  . Hypothyroidism   . Kidney tumor    "tumor on cyst on kidney"   . 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 Mini stroke ([place])   . Parkinson disease ([place])   . Pneumonia    hx of in 1952  . Primary skin squamous cell carcinoma   . Subdural hematoma ([place]) 2006  . TIA (transient ischemic attack)    pt does not recall this hx "but may have had one today" (08/09/2014)    Past Surgical History:  Procedure Laterality Date  . APPENDECTOMY    . BACK SURGERY    . CATARACT EXTRACTION W/ INTRAOCULAR LENS  IMPLANT, BILATERAL Bilateral ~ 1998  . CHOLECYSTECTOMY    . CRYOABLATION  07-10-12   "tumor on cyst on my kidney; had an ablation"  . EYE SURGERY    . INGUINAL HERNIA REPAIR Right   . IR GENERIC HISTORICAL  11/09/2015   IR  RADIOLOGIST EVAL & MGMT 11/09/2015 [name], MD [facility] INTERV RAD  . IR RADIOLOGIST EVAL & MGMT  08/13/2016  . KIDNEY SURGERY    . KNEE ARTHROSCOPY Right   . LEFT HEART CATH AND CORONARY ANGIOGRAPHY N/A 12/05/2016   Procedure: LEFT HEART CATH AND CORONARY ANGIOGRAPHY;  Surgeon: [name], [name], MD;  Location: [facility];  Service: Cardiovascular;  Laterality: N/A;  . LUMBAR LAMINECTOMY/DECOMPRESSION MICRODISCECTOMY  05/14/2012   Procedure: LUMBAR LAMINECTOMY/DECOMPRESSION MICRODISCECTOMY 1 LEVEL;  Surgeon: [name], MD;  Location: [facility];  Service: Neurosurgery;  Laterality: Left;  Left Lumbar four-five Laminectomy/Diskectomy/Far lateral diskectomy  . SKIN CANCER EXCISION  "several"   "back of my neck; right eye; clavicle; nose"  . SUBDURAL HEMATOMA EVACUATION VIA CRANIOTOMY  2006  . THYROIDECTOMY     medullary carcinoma  . TONSILLECTOMY       Current Outpatient Medications  Medication Sig Dispense Refill  . amLODipine (NORVASC) 5 MG tablet Take by mouth. Pt takes 7.5 mg daily at 6 pm    . apixaban (ELIQUIS) 2.5 MG TABS tablet Take 1 tablet (2.5 mg total) by mouth 2 (two) times daily. 180 tablet 3  . carbidopa-levodopa (SINEMET IR) 25-100 MG tablet Take 1.5 tablets by mouth 4 (four) times daily. At 8, 12, 4 PM, and 8 PM 360 tablet 3  . hydrALAZINE (APRESOLINE) 10 MG tablet Take 1 tablet by mouth up to three times daily for systolic blood pressure readings above 170. 90 tablet 0  . levothyroxine (SYNTHROID, LEVOTHROID) 100 MCG tablet TAKE 1 TABLET EVERY DAY BEFORE BREAKFAST 90 tablet 1  . losartan (COZAAR) 100 MG tablet Take 1 tablet (100 mg total) by mouth daily. 30 tablet 11  . Multiple Vitamin (MULTIVITAMIN WITH MINERALS) TABS Take 1 tablet by mouth daily. Reported on 08/01/2015    . nitroGLYCERIN (NITROSTAT) 0.4 MG SL tablet Place 1 tablet (0.4 mg total) under the tongue every 5 (five) minutes x 3 doses as needed for chest pain. 25 tablet 12  . Probiotic Product  (ALIGN) 4 MG CAPS Take 1 capsule by mouth daily.    . rasagiline (AZILECT) 1 MG TABS tablet Take 1 tablet (1 mg total) by mouth daily. 90 tablet 3  . simvastatin (ZOCOR) 40 MG tablet Take 1 tablet (40 mg total) by mouth at bedtime. 30 tablet 6   No current facility-administered medications for this visit.     Allergies:   Patient has no known allergies.    ROS:  Please  see the history of present illness.   Otherwise, review of systems are positive for none.   All other systems are reviewed and negative.    PHYSICAL EXAM: VS:  BP 127/69   Pulse (!) 55   SpO2 98%  , BMI There is no height or weight on file to calculate BMI.  GENERAL:  Well appearing NECK:  No jugular venous distention, waveform within normal limits, carotid upstroke brisk and symmetric, no bruits, no thyromegaly LUNGS:  Clear to auscultation bilaterally CHEST:  Unremarkable HEART:  PMI not displaced or sustained,S1 and S2 within normal limits, no S3, no S4, no clicks, no rubs, soft paicla systolic murmur, no diastolic murmurs ABD:  Flat, positive bowel sounds normal in frequency in pitch, no bruits, no rebound, no guarding, no midline pulsatile mass, no hepatomegaly, no splenomegaly EXT:  2 plus pulses throughout, no edema, no cyanosis no clubbing NEURO:  Resting tremor.     EKG: NA  Recent Labs: 12/03/2016: B Natriuretic Peptide 67.8 12/05/2016: ALT 5; Hemoglobin 13.5; Platelets 142 12/12/2016: BUN 26; Creatinine, Ser 1.50; Potassium 4.7; Sodium 144 12/31/2016: TSH 2.78    Lipid Panel    Component Value Date/Time   CHOL 116 12/05/2016 0207   TRIG 139 12/05/2016 0207   TRIG 101 03/25/2006 0935   HDL 46 12/05/2016 0207   CHOLHDL 2.5 12/05/2016 0207   VLDL 28 12/05/2016 0207   LDLCALC 42 12/05/2016 0207      Wt Readings from Last 3 Encounters:  12/31/16 179 lb 1.9 oz (81.2 kg)  12/31/16 178 lb (80.7 kg)  12/12/16 178 lb 6.4 oz (80.9 kg)      Other studies Reviewed: Additional studies/ records that  were reviewed today include:    Office records. Review of the above records demonstrates:      ASSESSMENT AND PLAN:  ATRIAL FIB:  The patient has a CHA2DS2 - VASc score of 5 with a risk of stroke of 6.7%.  No change in therapy.   THORACIC AORTIC ENLARGEMENT:  I will schedule an appt with Dr. Lenna Gilford to see if he needs further imaging of a pulmonary nodule that has been noted on a past CT.  I am not considering repeat imaging of the thoracic aorta which was 4 cm.  I will consider further imaging in the future.   HTN:  His meds will continue as listed.  We talked about PRN dosing of the hydralazine.    CAD:  The patient has no new sypmtoms.  No further cardiovascular testing is indicated.  We will continue with aggressive risk reduction and meds as listed.    CHRONIC DIASTOLIC HF: He seems to be euvolemic.  No change in therapy.      Current medicines are reviewed at length with the patient today.  The patient does not have concerns regarding medicines.  The following changes have been made:  None  Labs/ tests ordered today include: None  Orders Placed This Encounter  Procedures  . Ambulatory referral to Pulmonology     Disposition:   FU with me in 6 months.   I will have him follow in the Hypertension Clinic.    Signed, Minus Breeding, MD  03/14/2017 11:23 AM    [facility]

## 2017-03-14 ENCOUNTER — Encounter: Payer: Self-pay | Admitting: Cardiology

## 2017-03-14 ENCOUNTER — Ambulatory Visit: Payer: Medicare HMO | Admitting: Cardiology

## 2017-03-14 VITALS — BP 127/69 | HR 55

## 2017-03-14 DIAGNOSIS — I712 Thoracic aortic aneurysm, without rupture, unspecified: Secondary | ICD-10-CM | POA: Insufficient documentation

## 2017-03-14 DIAGNOSIS — R42 Dizziness and giddiness: Secondary | ICD-10-CM

## 2017-03-14 DIAGNOSIS — I5032 Chronic diastolic (congestive) heart failure: Secondary | ICD-10-CM

## 2017-03-14 DIAGNOSIS — I2510 Atherosclerotic heart disease of native coronary artery without angina pectoris: Secondary | ICD-10-CM

## 2017-03-14 DIAGNOSIS — I480 Paroxysmal atrial fibrillation: Secondary | ICD-10-CM | POA: Diagnosis not present

## 2017-03-14 MED ORDER — LISINOPRIL 2.5 MG PO TABS: 2.5000 mg | ORAL_TABLET | Freq: Every day | ORAL | 3 refills | Status: DC

## 2017-03-14 MED ORDER — AMLODIPINE BESYLATE 5 MG PO TABS: 5.0000 mg | ORAL_TABLET | Freq: Every day | ORAL | 3 refills | Status: DC

## 2017-03-14 NOTE — Patient Instructions (Signed)
Medication Instructions:  Continue current medications  If you need a refill on your cardiac medications before your next appointment, please call your pharmacy.  Labwork: None Ordered   Testing/Procedures: None Ordered  Follow-Up: Your physician wants you to follow-up in: 2 Months with Erasmo Downer for Hypertension.   You have been referred to Dr Teressa Lower Pulmonologist   Thank you for choosing [facility] at [facility]!!

## 2017-03-17 ENCOUNTER — Other Ambulatory Visit: Payer: Self-pay | Admitting: Primary Care

## 2017-03-17 DIAGNOSIS — I10 Essential (primary) hypertension: Secondary | ICD-10-CM

## 2017-03-17 NOTE — Telephone Encounter (Signed)
Please send request to cardiology.

## 2017-03-17 NOTE — Telephone Encounter (Signed)
Ok to refill? Electronically refill request for hydrALAZINE (APRESOLINE) 10 MG tablet  Last prescribed and seen on 12/31/2016.

## 2017-03-21 ENCOUNTER — Other Ambulatory Visit: Payer: Self-pay | Admitting: Pharmacist

## 2017-03-21 MED ORDER — AMLODIPINE BESYLATE 2.5 MG PO TABS: 2.5000 mg | ORAL_TABLET | Freq: Every day | ORAL | 3 refills | Status: DC

## 2017-03-21 NOTE — Telephone Encounter (Signed)
Amlodipine dose changed to 7.5mg  daily.   Rx Amlodipine 5mg  daily sent to take with amlodipine 2.5mg  = 7.5mg  daily  Rx for lisinopril 2.5mg  sent in error -   Erroneous Rx discontinued, correct amlodipine rx sent to prefer pharmacy.   Patient instructions clarified as well.

## 2017-03-27 ENCOUNTER — Encounter: Payer: Self-pay | Admitting: Pulmonary Disease

## 2017-03-27 ENCOUNTER — Ambulatory Visit: Payer: Medicare HMO | Admitting: Pulmonary Disease

## 2017-03-27 VITALS — BP 130/72 | HR 45 | Temp 97.5°F | Ht 76.0 in | Wt 180.0 lb

## 2017-03-27 DIAGNOSIS — I7091 Generalized atherosclerosis: Secondary | ICD-10-CM

## 2017-03-27 DIAGNOSIS — I480 Paroxysmal atrial fibrillation: Secondary | ICD-10-CM | POA: Diagnosis not present

## 2017-03-27 DIAGNOSIS — G20 Parkinson's disease: Secondary | ICD-10-CM

## 2017-03-27 DIAGNOSIS — Z7901 Long term (current) use of anticoagulants: Secondary | ICD-10-CM | POA: Diagnosis not present

## 2017-03-27 DIAGNOSIS — R9389 Abnormal findings on diagnostic imaging of other specified body structures: Secondary | ICD-10-CM

## 2017-03-27 DIAGNOSIS — G459 Transient cerebral ischemic attack, unspecified: Secondary | ICD-10-CM | POA: Diagnosis not present

## 2017-03-27 DIAGNOSIS — R911 Solitary pulmonary nodule: Secondary | ICD-10-CM | POA: Diagnosis not present

## 2017-03-27 DIAGNOSIS — I10 Essential (primary) hypertension: Secondary | ICD-10-CM | POA: Diagnosis not present

## 2017-03-27 NOTE — Patient Instructions (Signed)
Today we updated your med list in our EPIC system...    Continue your current medications the same...  We will schedule a follow up CT chest scan to compare to the previous scans...    We will contact you w/ the results when available...     We will determine at that time when to plan our next follow up visit...  Call for any questions.Marland KitchenMarland Kitchen

## 2017-03-28 ENCOUNTER — Encounter: Payer: Self-pay | Admitting: Pulmonary Disease

## 2017-03-28 NOTE — Progress Notes (Signed)
Subjective:    Patient ID: William [name], male    DOB: 03-15-1929, 83 y.o.   MRN: [id number]  HPI 83 y/o WM here for a follow up visit... he has multiple medical problems as noted below... followed for HBP, Carotid vasc dis w/ hx of TIA & prev subdural hematoma, Chol, & prev surg for medullary thyroid carcinoma... his wife passed away from metastatic breast cancer 11/09... 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  LABS 1/14 preop in EPIC> Chems- ok & Cr=1.3-1.5;  CBC- ok  LABS 8/14:  FLP- at goals on Simva20;  Chems- ok x Cr=1.5;  CBC- wnl;  TSH=0.54 & FreeT4=1.28 (0.6-1.60)...  CT Abd showed further contraction of the cryoablation zone within the lower part of the right kidney, no enhancement, bilat renal cysts, chr right UPJ stenosis; Incidentally noted [place], mult splenic granuloma, diffuse Ao atherosclerosis & dilatation of infrarenal Ao to 3.3cm, DJD in lumbar spine...   LABS 9/15:  FLP- at goals on Simva20;  Chems- ok x Cr=1.4;  CBC- wnl;  TSH=0.54;  FreeT3 & FreeT4- wnl...  ~  April 26, 2014:  97mo ROV & add-on appt requested for HBP> he has been well controlled on Cardura8- 1/2 daily and Hyzaar100-12.5; over the last week his home BP checks were in the 200/100 range and William [name] notes that "he feels it"; pt states that his head feels heavy- denies CP, palpit, SOB, edema; I note that wt is up 7# to 186# today, he denies eating salt; BP here today= 132/84 & they didn't bring his cuff; we decided to check CXR (norm heart size, NAD) and BMet (ok x HCO3=36, Cr=1.4); we decided to incr  Cardura to 8mg /d and f/u 52mo; get on diet & gety wt back down; he will call in the interim for concerns...    We reviewed prob list, meds, xrays and labs> see below for updates >>   CXR 1/16 showed norm heart size, clear hyperinflated lungs, stable mediastinal calcif, NAD...  LABS 1/16:  Chems- ok x HCO3=36 Cr=1.4.Marland KitchenMarland Kitchen PLAN>>  We decided to incr his Cardura to 8mg /d; he will continue the Hyzaar & monitor BP at home, ROV 16mo, call for questions...   ~  July 22, 2014:  76mo ROV & post hospital check> William [name] was Adm 4/3 - 07/18/14 by [facility] w/ AFib & RVR, after waking up weak, felt "funny", BP was low; In ER found AFib/ rvr, no CP, & he converted to NSR w/ Cardizem drip; CHA2DS2-VASc score= 5 for age>75, hx of TIA, male, HBP & started on Xarelto15/d (due to hx subdural in past)... Note: mult other changes to is meds during this brief hosp> he goes to the [facility] for his med refills...    HBP> on Losar25 & Cardizem60Bid; BP=120/70 today & denies CP, palpit, SOB, edema... Continue these new meds...    PAF> adm 4/16 w/ PAF &rvr; converted spont to NSR & meds adjusted, disch on Xarelto15/d...    ASPVD> off ASA 81 now since Xarelto started; denies cerebral ischemic symptoms etc; IR found ~  4cm AAA on f/u CTAbd 4/16 & he has appt w/ DrBrabham...Marland KitchenMarland KitchenMarland Kitchen    Chol> on Lip20 now, Fish Oil & diet rx; FLP 9/15 (on Simva20) shows TChol 131, TG 72, HDL 50, LDL 67    Hx medullary thyroid cancer> on Synthroid100 now; followed by [facility] for CCS; TFTs were wnl on Levothy150 w/ TSH 9/15 = 0.54 & FreeT3=3.0 (2.3-4.2) & FreeT4= 1.31 (0.6-1.60); dose decr to 164mcg/d by Cards due to PAF 4/16.    GI- Divertics, HxPolyps> on Align; last colon 2007 by DrSamLeB showed divertics, hems, no polyps & f/u suggested 77yrs but now >82y/o...    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    DJD, LBP> off Vicodin, on Tylenol; he developed LBP after lifting a mattress 5/13; eval by DrBrooks & DrHirsh w/ MRI reported to show HNP at L4-5 (w/ myelopathy & radiculopathy); DrHirsh did LumbarLam 1/14 w/ decompression L4 & L5 w/ diskectomy=> improved...    Neuro- TIA, Hx subdural hemtoma, Tremor/Parkinson's> now followed by DrAthar for Neuro on Sinemet25-100Qid & Azilect1mg /d- seen 04/2014 & he reports stable...    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  CXR 4/16 showed norm heart size, clear lungs, mult calcif mediastinal LNs (old gran dis), NAD...   2DEcho 4/16 showed mild LVH, norm LVF=60-65% & no regional wall motion abn, Gr1DD, valves OK, LA=106mm, mod RV dil...   LABS 4/16:  Reviewed- Cr=1.3-1.6, sl elev troponins, TSH=0.532...  IMP/ PLAN>>  Brief episode of PAF, converted spont to NSR, started on Xarelto15 for elev risk (but has hx SDH as well) and mult other meds adjusted as noted;  He is feeling better overall & awaiting f/u appt w/ Cards- DrHochrein;  He had f/u [facility]Abd by [facility] & his cryoablation zone lower pole right kidney is further sl improved but there was a 4cm AAA & he has appt w/ DrBrabham to follow this... He will need f/u FLP, BMet, & TSH on return...  ~  Aug 23, 2014:  3mo ROV & add-on for HBP & to review recent events> William [name] was [facility] 4/26 - 08/10/14 by [facility] w/ TIA after presenting w/ transient aphasia & confusion; he was already on Xarelto15 (due to PAF w/ CHADS-VASc=5 but prev hx subdural hematoma); CT Brain w/o acute changes (mild atrophy & sm vessel dis), subseq MRI/MRA was also neg w/o infarct or large vessel occlusion or stenosis (prev left craniotomy, right P2/3 mid grade stenosis); seen by Neurology &  they rec adding ASA81/d;  He was also bradycardic & his Cardizem was stopped & ultimately Cozaar dose increased...  He had Cards f/u DrHochrein 4/29 & returned to the ER 5/5 w/ elevBP and ?slurring (vs his regular parkinson's voice)=>  His Xarelto & ASA were continued and Cozaar changed to Hyzaar100-12.5.Marland KitchenMarland Kitchen    HBP> on Hyzaar100-12.5 & off Cardizem; BP=180/100 today & later down to 148/80; denies CP, palpit, SOB, edema; BP at home >200 & we decided to add HYDRALAZINE 25Tid.    PAF> adm 4/16 w/ PAF &rvr; converted spont to NSR & meds adjusted, disch on Xarelto15/d, holding NSR.Marland KitchenMarland Kitchen  ASPVD> back on ASA 81 & Xarelto since his TIA 4/16; denies further cerebral ischemic symptoms etc; IR found ~4cm AAA on f/u CTAbd 4/16 & he has appt w/ DrBrabham......    Neuro- TIA, Hx subdural hemtoma, Tremor/Parkinson's> now followed by DrAthar for Neuro on Sinemet25-100Qid but they stopped Azilect1mg /d since [place] read that it can cause elevBP...    Hx medullary thyroid cancer> on Synthroid100 now; followed by [facility] for [facility]CS; TFTs were wnl on Levothy150 w/ TSH 9/15 = 0.54 & FreeT3=3.0 (2.3-4.2) & FreeT4= 1.31 (0.6-1.60); dose decr to 163mcg/d by Cards due to PAF-4/16 We reviewed prob list, meds, xrays and labs> see below for updates >>   LABS 5/16:  TSH=2.94 PLAN>>  William [name] will continue the Hyzaar100-12.5 & add Hydralazine25tid while watching his BP;  They stopped his Azilect since [place] read that prolonged use can cause incrBP- he is asked to check w/ Neuro;  ROV 29mo to f/u BP on meds... TSH is wnl on Synthroid100- continue same.  ~  November 22, 2014:  14mo ROV & follow up of his BP- last OV we added Hydralazine25Tid to his regimen of Hyzaar100-12.5; tolerated well & BP is improved at 142/76, recheck 130/70-  w/o CP, palpit, dizzy, edema=> William [name] has esablished Primary Care closer to home [facility]) at [facility] office w/ Dr. Eliezer Lofts & Rayburn Go => their notes are reviewed...    ~  November 30, 2015:  1 year ROV &  pulmonary follow up visit requested by DrYamagata>  William [name] has been followed regularly by [facility] due to his right renal oncocytoma treated w/ cryoablation [id number];  Serial CT scans of the abd have shown post ablation changes w/ contraction of the cryoablation zone in lower pole of right kidney, but a small area of nodular scarring was noted in the medial RLL gutter as far back as 2014-15 (other incidental findings on the CT Abd included s/pGB & mild CBD dilatation, duodenal divertic, mult calcif splenic granulomas, atherosclerotic change & mild aneursymal ectasia of the infrarenal AA~3.3cm, bilat renal cysts, and multilevel DDD in spine);  CT Abd 07/27/15 showed enlargement of this nodular scarring in the medical right base and a dedicated CT Chest done 11/09/15 showed norm heart size, calcif plaque in the Ao & coronaries (asc Ao ~4cm), calcif mediastinal & right hilar nodes, area of nodularity & pleural based consolidation over the post-medial RLL w/ assoc bronchiectatic changes (sl larger that 07/2015); Radiology favors a post infectious/ inflammatory process but neoplasm can't be excluded...     William [name] has a remote smoking hx- smoked up to 1ppd spanning his 21s into his 21s for a 10 pack-yr hx but he quit 50+ yrs ago;  He denies exposure to asbestos or silica dusts;  From the symptomatic standpoint he denies cough, sputum, hemoptysis, SOB/change in DOE, CP, etc;  He has been doing boxing therapy for his Parkinson's dis & improved...  EXAM shows Afeb, VSS, O2sat=98% on RA;  HEENT- neg, mallampati2;  Chest- clear w/o w/r/r;  Heart- RR gr1/6SEM w/o r/g;  Abd- soft, nontender, neg;  Ext- neg w/o c/c/e;  Neuro- parkinson's, no focal neuro deficits...   LABS8/2017>  Chems- wnl x Cr=1.49;  CBC- wnl w/ Hg=15.2;  Sed=10...  PET-CT scan> ordered and pending... IMP/PLAN>>  William [name] is 83 y/o w/ mult co-morbidities (Parkinsons, ASHD, PAF on Xarelto, ASPVD w/ prev TIA, hx medullary thyroid ca);  These would surely  compromise any aggressive work-up/ treatment for this peripheral lung abnormality in a relatively asymptomatic individual;  In any event he would like to pursue further evaluation- Labs are unrevealing & PET scan is the next step=> pending...   ADDENDUM>>  PET done 12/06/15>  Low level hypermetabolism corresponding to the subpleural RLL opac assoc w/ bronchiectasis & fluid filled bronchi- strongly favoring infectious/ inflamm etiology including chr aspiration (neoplasm felt to be considerably less likely)... Other incidental findings on prior CT scans.  ADDENDUM>>  CT Abd 08/08/16>>  IMPRESSION: 1. Stable exam. No changes in the ablation scar suggests recurrent disease. No metastatic disease in the abdomen. 2. Several tiny hypervascular foci in the liver parenchyma, stable and most consistent with benign process such as flash filling cavernoma or vascular malformation. Attention on follow-up suggested. 3. No change right hydronephrosis with imaging features consistent with UPJ obstruction. 4. Stable bilateral renal cysts. 5. Infrarenal abdominal aortic aneurysm. Recommend followup by ultrasound in 2 years. This recommendation follows ACR consensus guidelines: White Paper of the ACR Incidental Findings Committee II on Vascular Findings. J Am Coll Radiol 2013; 10:789-794. 6. Stable intra and extrahepatic biliary duct dilatation. 7. Airspace disease posterior right lower lobe seen on previous exams has almost completely resolved. There is 1 residual area that has a 14 mm somewhat nodular component and attention to this area on follow-up imaging is recommended.   ~  March 27, 2017:  48mo ROV & pulm recheck>  William [name] was set up to see me for this follow up visit by DrHochrein due to pt's prev abn CT scans w/ a small area of nodular scarring was noted in the medial RLL gutter as far back as 2014-15; CT Abd 07/27/15 showed enlargement of this nodular scarring in the medical right base and a dedicated CT Chest  done 11/09/15 showed the area of nodularity & pleural based consolidation over the post-medial RLL w/ assoc bronchiectatic changes (sl larger that 07/2015); Radiology favors a post infectious/ inflammatory process but neoplasm can't be excluded; we proceeded w/ a PET scan done 12/06/15 showing low level hypermetabolism corresponding to the subpleural RLL opac assoc w/ bronchiectasis & fluid filled bronchi- strongly favoring infectious/ inflamm etiology including chr aspiration (neoplasm felt to be considerably less likely); this was corroberated by a subseq CT Abd done 08/08/16 revealing airspace disease posterior right lower lobe seen on previous exams has almost completely resolved (there is 1 residual area that has a 14 mm somewhat nodular component and attention to this area on follow-up imaging is recommended)=> it is for this reason that he returns today, I pointed out that the CT Abd showed this area in the right medical lung base very well & it is markedly improved on the 07/2016 CT Abd but radiology indicated that f/u imaging was needed => we have ordered the repeat CT Chest...    From the pulmonary standpoint William [name] is stable- states his breathing is OK, notes sl cough, denies sput or hemoptysis, denies SOB, he remained active w/ boxing therapy for his parkinson's dis until 11/2016 & now on a lighter exerc program...    EXAM shows Afeb, VSS x bradycardia, O2sat=95% on RA;  HEENT- neg, mallampati2;  Chest- clear w/o w/r/r;  Heart- RR gr1/6SEM w/o r/g;  Abd- soft, nontender, neg;  Ext- neg w/o c/c/e;  Neuro- parkinson's tremor, no focal neuro deficits...   CT Chest 04/04/17 (independently reviewed by me in the PACS system) >> IMPRESSION:   1. Interval decrease in size of subpleural consolidation within the medial right lower lobe most suggestive of improving/resolving infectious/inflammatory process. 2. Ascending thoracic aorta is  dilated measuring 4.4 cm. Ascending thoracic aortic aneurysm. Recommend  semi-annual imaging followup by CTA or MRA and referral to cardiothoracic surgery if not already obtained. This recommendation follows 2010 ACCF/AHA/AATS/ACR/ASA/SCA/SCAI/SIR/STS/SVM Guidelines for the Diagnosis and Management of Patients With Thoracic Aortic Disease.Circulation. 2010; 121: S923-R007. IMP/PLAN>>  William [name] is reassured regarding this CT abnormality, likely infectious/inflamm & poss related to his swallowing difficulty, aspiration, etc; DrHochrein is following his thoracic aorta & copy of the scan was forwarded to him; it appears to me that this area is adequately imaged via CT Abd and CT Chest so we can follow it from the standpoint of his f/u scans to re-assess his renal lesion from [facility] and f/u scans to check his aneurysm from [place]...       NOTE:  >50% of this 49min rov was spent in counseling & coordination of care...          Problem List:  ABNORMAL CT Chest w/ area of subpleural consolidation in RLL medially => see above...  HYPERTENSION (ICD-401.9) - controlled on HYZAAR 100/25 daily, & CARDURA 8mg - taking 1/2 daily... ~  2/13:  BP= 122/60 & he denies CP, palpit, SOB, edema, etc... ~  8/13:  BP= 140/80 & he remains largely asymptomatic... ~  CXR 1/14 showed normal heart size, ectatic & calcif Ao, low lying diaph, clear lungs, NAD... ~  EKG 1/14 showed NSR, rate61,  Poor R prog V1-3, NSSTTWA... ~  2/14: on Hyzaar100-12.5 & Cardura8; BP=138/80 today & denies CP, palpit, SOB, edema. ~  3/15: on Hyzaar100-12.5 & Cardura8-1/2; BP=120/58 today & he remains asymptomatic... ~  9/15: on Hyzaar100-12.5 & Cardura8-1/2; BP=110/70 today & denies CP, palpit, SOB, edema... ~  1/16: he presented w/ BP up to 200/100 at home; it was 132/84 here & we decided to incr Cardura to 8mg /d, recheck 58mo... ~  CXR 1/16 showed norm heart size, clear hyperinflated lungs, stable mediastinal calcif, NAD ~  4/16:  He was hosp this month w/ PAF> now on [place]; BP=120/70 today &  denies CP, palpit, SOB, edema; subseq Cardizem stopped due to bradycardia & Losar incr to 50mg /d; then this was changed to Hyzaar100-12.5 ~  5/16:  BP still elev at home & office on Hyzaar100-12.5 now; BP= 180/100 & we decided to add HYDRALAZINE 25Tid...   PAF >> he was adm 4/3-07/2014 w/ PAF & rvr, converted w/ Cardizem drip & meds adjusted- disch on Xarelto15 & Cardizem60Bid, holding NSR & doing satis...  ATHEROSCLEROTIC VASCULAR DISEASE (ICD-440.9) - on ASA 81mg /d;  Prev Plavix was discontinued after his subdural hematoma in 2006. ~  Eval 2003 by [facility] for TIA showed cerebrovasc dis w/ R>L PCA stenoses... ~  4/15: f/u CT Abd (f/u cryoablation of renal oncocytoma) also showed 3.3cm AAA being followed... ~  4/16: f/u CT Abd by IR, DrYamagata showed further contraction of the cryoablation defect w/ other changes persisting & AAA now 3.9cm in size=> he's been referred to DrBrabham to establish care...  HYPERLIPIDEMIA (ICD-272.4) - on SIMVASTATIN 40mg -taking 1/2 + FISH OIL 1000mg /d> doing well. ~  FLP 12/07 on Zocor showed TChol 137, HDL 47, LDL 70 ~  FLP 7/09 off med showed TChol 196, TG 95, HDL 49, LDL 128... rec> restart Zocor 40mg . ~  FLP 7/10 showed TChol 135, TG 47, HDL 53, LDL 73... He decr dose to 1/2 tab on his own... ~  [place] 8/11 showed TChol 138, TG 73, HDL 49, LDL 75 ~  FLP 8/12 showed TChol 125, TG 69, HDL 56, LDL  56 ~  FLP 2/13 showed TChol 133, TG 69, HDL 61, LDL 59 ~  FLP 8/13 showed TChol 137, TG 73, HDL 53, LDL 70 ~  FLP 8/14 on Simva20 showed  TChol 128, TG 74, HDL 48, LDL 66 ~  FLP 9/15 on Simva20 showed TChol 131, TG 72, HDL 50, LDL 67  ~  4/16:  Now on Atorva20...  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 ~  labs 7/09 showed TSH= 3.47... on SYNTHROID 261mcg/d ~  labs 7/10 showed TSH= 2.95 ~  labs 8/11 showed TSH= 1.20 ~  Labs 8/12 showed TSH= 0.23 & pt rec to decr the Synthroid200 to  only 1/2 on MWF.Marland Kitchen. ~  Labs 2/13 showed TSH= 2.29 on Synthroid200- taking 1x4d=TThSS and 1/2 x3d=MWF.Marland Kitchen. ~  Labs 8/13 showed TSH= 0.69... On same dose. ~  Labs 8/14 on Synthroid200-taking 1 alt w/ 1/2 qod showed TSH=0.54, Free T4=1.28 ~  3/15: we discussed change in Synthroid to 157mcg/d... ~  Labs 9/15 on Synthroid150 showed TSH = 0.54 & FreeT3=3.0 (2.3-4.2) & FreeT4= 1.31 (0.6-1.60) ~  4/16:  He is now on Synthroid 160mcg/d since hosp for PAF 4/16... TSH on Levothy100= 2.94, continue same.  COLONIC POLYPS (ICD-211.3) - last colonoscopy 1/07 by DrSam showed divertics & hems only... f/u planned 54yrs.  BENIGN PROSTATIC HYPERTROPHY, WITH OBSTRUCTION (ICD-600.01) - he sees DrDavis for Urology once a year... he has a brother who had prostate cancer...  ~  PSA followed by ENIDPOEU yrearly... 7/10 pt reports PSA>5 (was 4.17 here) & Rx w/ Cipro... ~  he had f/u Urology eval DrGrapey 5/11- pt indicates that his PSA was OK... 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 ~  2/14:  followed by DrGrapey & DrYamagata re-imaged the lesion via CT 1/14 w/ growth evident & he has rec Cryoablation=> pending due to need for LLam first... ~  3/14:  he underwent cryoablation of right renal oncocytic tumor by DrYamagata (pt reports painful procedure) ~  5/14:  f/u  visit w/ IR & CT Abd=> 2 left renal cysts & lower pole right renal cyst, cryoablation zone in inferior pole right kidney measures 4x3cm; atherosclerotic changes in Ao, mult splenic granulomata, prom CBD... ~  9/14: f/u visit w/ IR, DrYamagata> s/p percut cryoablation of right renal oncocytic tumor 3/14; doing well, CTshowed expected post ablation changes, no resid or recurrent tumor suspected...  ~  1/15: f/u visit w/ DrGrapey> doing well from the renal tumor standpoint, BPH, LTOS, ED, etc... ~  4/14: he had f/u DrYamagata> doing well, asymptomatic- BUN=17, Cr=1.21, f/u CT showed further retraction of right sided cryoablation defect, no abnormal enhancement, bilat renal cysts... ~  4/15: he had yearly f/u w/ DrYamagata> CT Abd  showed further contraction of the cryoablation zone in the lower pole of the right kidney, no recurrent tumor, stable bilat renal cysts, chr right UPJ stenosis, 3.3cm AAA noted... ~  4/16: he had yearly f/u by IR> CT Abd showed post ablation changes w/ contraction of the cryoablation zone in lower pole of right kidney & no enhancement, mild infrarenal AAA ~4cm & he has appt w/ DrBrabham soon                TRANSIENT ISCHEMIC ATTACK (ICD-435.9) - hx of recurrent right brain TIA's in 2003... initially on ASA + Plavix and the Plavix was stopped after his subdural in 2006... ~  [facility] 4/16 by [facility] w/ TIA> presented w/ aphasia & confusion=> rapidly cleared; Neuro rec adding back ASA81 to his [place]...  Hx of SUBDURAL HEMATOMA (ICD-432.1) - hosp 7/06 w/ left subdural hematoma req craniotomy by DrHirsh for evacuation... no known trauma- he was on ASA/ Plavix and developed a headache... he also take DILANTIN 100mg  Tid now per [facility] (for a partial seizure characterized by aphasia)... ~  7/10: pt tells me he has decreased his Dilantin to Bid and will f/u w/ DrLove. ~  8/11:  we don't have any recent notes from [facility], but pt is off the Dilantin rx.  TREMOR (ICD-781.0) - he notes mild  tremor right hand & arm> eval from Neurology- Gean Quint- indicated prob early [name] and after a period of observation they started Mirapex (stopped due to hypotension), changed to [facility] 2/12> dosed per DrLove. ~  8/11: pt tells me that he has started accupuncture treatments in [place]==> no benefit. ~  2/12:  pt indicates that DrLove stopped the Mirapex in favor of SINEMET 25/100- taking 1/2 Tid. ~  8/12:  Pt tells me he has seen DrScott, Neurology at [facility] (?referred by [facility], ?second opinion), we don't have records, pt reports no change in meds (he considered entering a drug study). ~  He continues to f/u w/ DrLove every 4-46months... ~  8/13:  He indicates that Drlove recently increased the SINEMET 25/100 to 1 tab Tid... ~  8/14:  He had Neuro f/u DrAthar> note reviewed & pt stable on Sinemet Tid + Azilect 1mg /d (added 5/14 by Neuro)...  DEGENERATIVE JOINT DISEASE (ICD-715.90) - he notes some knee discomfort & he takes Aleve & HYDROCODONE as needed... LBP >> injured back lifting mattress 5/13; eval by DrBrooks & DrHirsh w/ abn MRI (done by Ortho at their office) & disc herniation L4-5 by report; Pred w/o help, ESI helped some, using pain Rx & holding off on surg per DrHirsh... ~  1/14: on Vicodin prn; eval by DrBrooks & DrHirsh w/ MRI reported to show HNP at L4-5 (w/ myelopathy & radiculopathy); DrHirsh did LumbarLam 1/14 w/ decompression L4 & L5 w/ discectomy=> improved...  Hx of CARCINOMA, SKIN, SQUAMOUS CELL (ICD-173.9) - s/p removal of skin cancer from his right forearm 11/06 by DrLupton... ~  9/15: he reports another skin cancer removed...  Health Maintenance:  he had PNEUMOVAX vaccine in 1998 & 1/11... gets yearly Flu vaccine every fall... TDAP given 8/11...   Past Surgical History:  Procedure Laterality Date  . APPENDECTOMY    . BACK SURGERY    . CATARACT EXTRACTION W/ INTRAOCULAR LENS  IMPLANT, BILATERAL Bilateral ~ 1998  . CHOLECYSTECTOMY    . CRYOABLATION  07-10-12    "tumor on cyst on my kidney; had an ablation"  . EYE SURGERY    . INGUINAL HERNIA REPAIR Right   .  IR GENERIC HISTORICAL  11/09/2015   IR RADIOLOGIST EVAL & MGMT 11/09/2015 [name], MD [facility] INTER[facility] RAD  . IR RADIOLOGIST EVAL & MGMT  08/13/2016  . KIDNEY SURGERY    . KNEE ARTHROSCOPY Right   . LEFT HEART CATH AND CORONARY ANGIOGRAPHY N/A 12/05/2016   Procedure: LEFT HEART CATH AND CORONARY ANGIOGRAPHY;  Surgeon: [name], [name], MD;  Location: [facility];  Service: Cardiovascular;  Laterality: N/A;  . LUMBAR LAMINECTOMY/DECOMPRESSION MICRODISCECTOMY  05/14/2012   Procedure: LUMBAR LAMINECTOMY/DECOMPRESSION MICRODISCECTOMY 1 LEVEL;  Surgeon: [name], MD;  Location: [facility];  Service: Neurosurgery;  Laterality: Left;  Left Lumbar four-five Laminectomy/Diskectomy/Far lateral diskectomy  . SKIN CANCER EXCISION  "several"   "back of my neck; right eye; clavicle; nose"  . SUBDURAL HEMATOMA EVACUATION VIA CRANIOTOMY  2006  . THYROIDECTOMY     medullary carcinoma  . TONSILLECTOMY      Outpatient Encounter Medications as of 03/27/2017  Medication Sig  . amLODipine (NORVASC) 2.5 MG tablet Take 1 tablet (2.5 mg total) by mouth daily. To take with amlodipine 5mg  for total of 7.5mg  daily  . amLODipine (NORVASC) 5 MG tablet Take 1 tablet (5 mg total) by mouth daily. To take with 2.5 mg to make 7.5 mg daily  . apixaban (ELIQUIS) 2.5 MG TABS tablet Take 1 tablet (2.5 mg total) by mouth 2 (two) times daily.  . carbidopa-levodopa (SINEMET IR) 25-100 MG tablet Take 1.5 tablets by mouth 4 (four) times daily. At 8, 12, 4 PM, and 8 PM  . hydrALAZINE (APRESOLINE) 10 MG tablet Take 1 tablet by mouth up to three times daily for systolic blood pressure readings above 170.  . levothyroxine (SYNTHROID, LEVOTHROID) 100 MCG tablet TAKE 1 TABLET EVERY DAY BEFORE BREAKFAST  . losartan (COZAAR) 100 MG tablet Take 1 tablet (100 mg total) by mouth daily.  . Multiple Vitamin (MULTIVITAMIN WITH  MINERALS) TABS Take 1 tablet by mouth daily. Reported on 08/01/2015  . nitroGLYCERIN (NITROSTAT) 0.4 MG SL tablet Place 1 tablet (0.4 mg total) under the tongue every 5 (five) minutes x 3 doses as needed for chest pain.  . Probiotic Product (ALIGN) 4 MG CAPS Take 1 capsule by mouth daily.  . rasagiline (AZILECT) 1 MG TABS tablet Take 1 tablet (1 mg total) by mouth daily.  . simvastatin (ZOCOR) 40 MG tablet Take 1 tablet (40 mg total) by mouth at bedtime.   No facility-administered encounter medications on file as of 03/27/2017.     No Known Allergies   Current Medications, Allergies, Past Medical History, Past Surgical History, Family History, and Social History were reviewed in [employer] record.    Review of Systems       See HPI - all other systems neg except as noted...       The patient complains of dyspnea on exertion, right hand tremor, and difficulty walking.  The patient denies anorexia, fever, weight loss, weight gain, vision loss, decreased hearing, hoarseness, chest pain, syncope, peripheral edema, prolonged cough, headaches, hemoptysis, abdominal pain, melena, hematochezia, severe indigestion/heartburn, hematuria, incontinence, muscle weakness, suspicious skin lesions, transient blindness, depression, unusual weight change, abnormal bleeding, enlarged lymph nodes, and angioedema.     Objective:   Physical Exam     WD, WN, 83 y/o WM in NAD... GENERAL:  Alert & oriented; pleasant & cooperative... HEENT:  [place]/AT, EOM-wnl, PERRLA, EACs-clear, TMs-wnl, NOSE-clear, THROAT-clear & wnl. NECK:  Supple w/ fairROM; no JVD; normal carotid impulses w/o bruits; thyroid  area scar, no nodules palp; no lymphadenopathy. CHEST:  Clear to P & A; without wheezes/ rales/ or rhonchi heard... HEART:  Regular Rhythm; without murmurs/ rubs/ or gallops detected... ABDOMEN:  Soft & nontender; normal bowel sounds; no organomegaly or masses palpated... BACK:  Scar of LLam  surg... EXT: without deformities, mild arthritic changes; no varicose veins/ +venous insuffic/ no edema. NEURO:  CN's intact;  mod tremor right hand & arm at rest, ?early mask facies, still moves well. DERM:  No lesions noted; no rash etc...  RADIOLOGY DATA:  Reviewed in the EPIC EMR & discussed w/ the patient...  LABORATORY DATA:  Reviewed in the EPIC EMR & discussed w/ the patient...   Assessment & Plan:    Abnormal CT Chest>  area of nodularity & pleural based consolidation over the post-medial RLL w/ assoc bronchiectatic changes => serial scans has shown improvement suggesting infectious/inflamm etiology and this area remains amenable to f/u scans via CT Abd or CT Chest... 03/27/17>  William [name] is reassured regarding this CT abnormality, likely infectious/inflamm & poss related to his swallowing difficulty, aspiration, etc; DrHochrein is following his thoracic aorta & copy of the scan was forwarded to him; it appears to me that this area is adequately imaged via CT Abd and CT Chest so we can follow it from the standpoint of his f/u scans to re-assess his renal lesion from [facility] and f/u scans to check his aneurysm from [facility]...    PAF>> Brief episode of PAF, converted spont to NSR, on Eliquis E followed by DrHochrein...  HBP>  Hx volatile BP changes=> Losartan increased, then changed to Hyzaar; then added HYDRALAZINE 25Tid & continue to monitor BP at home.  PVD- carotids and AAA> on Eliquis- w/o cerebral ischemic symptoms, & he has appt w/ [facility]- DrBrabham...  HYPERLIPID>  On Simva20 now + FLP followed [facility], DrLetvak et al...  Hx Medullary Thyroid Carcinoma>  S/p surg by DrNewman2002, on Synthroid 100mg /d now & TSH = 2.94, continue same.  GI> remote hx polyp, divertics, hems>  Last colon 2007 by by DrSam & suggested f/u 53yrs...  GU>  Followed by DrGrapey now, RENAL LESION found incidentally on MRI Lumbar spine & w/u revealed oncocytoma w/ cryoablation per  [facility] 3/14 & stable on f/u visits/ scans...  NEURO> Hx TIA, Hx SDH, Tremor/ Parkinson's>  Followed by Gean Quint & now DrAthar on Sinemet & Azilect, improved...  DJD, LBP>  He had decompressive LLam 1/14 by DrHirsh & is recovering nicely...  Other medical issues as noted...     Medication List        Accurate as of 03/27/17 11:59 PM. Always use your most recent med list.          ALIGN 4 MG Caps   * amLODipine 5 MG tablet Commonly known as:  NORVASC Take 1 tablet (5 mg total) by mouth daily. To take with 2.5 mg to make 7.5 mg daily   * amLODipine 2.5 MG tablet Commonly known as:  NORVASC Take 1 tablet (2.5 mg total) by mouth daily. To take with amlodipine 5mg  for total of 7.5mg  daily   apixaban 2.5 MG Tabs tablet Commonly known as:  ELIQUIS Take 1 tablet (2.5 mg total) by mouth 2 (two) times daily.   carbidopa-levodopa 25-100 MG tablet Commonly known as:  SINEMET IR Take 1.5 tablets by mouth 4 (four) times daily. At 8, 12, 4 PM, and 8 PM   hydrALAZINE 10 MG tablet Commonly known as:  APRESOLINE Take 1 tablet  by mouth up to three times daily for systolic blood pressure readings above 170.   levothyroxine 100 MCG tablet Commonly known as:  SYNTHROID, LEVOTHROID TAKE 1 TABLET EVERY DAY BEFORE BREAKFAST   losartan 100 MG tablet Commonly known as:  COZAAR Take 1 tablet (100 mg total) by mouth daily.   multivitamin with minerals Tabs tablet   nitroGLYCERIN 0.4 MG SL tablet Commonly known as:  NITROSTAT Place 1 tablet (0.4 mg total) under the tongue every 5 (five) minutes x 3 doses as needed for chest pain.   rasagiline 1 MG Tabs tablet Commonly known as:  AZILECT Take 1 tablet (1 mg total) by mouth daily.   simvastatin 40 MG tablet Commonly known as:  ZOCOR Take 1 tablet (40 mg total) by mouth at bedtime.      * This list has 2 medication(s) that are the same as other medications prescribed for you. Read the directions carefully, and ask your doctor or other  care provider to review them with you.

## 2017-04-04 ENCOUNTER — Ambulatory Visit (INDEPENDENT_AMBULATORY_CARE_PROVIDER_SITE_OTHER)
Admission: RE | Admit: 2017-04-04 | Discharge: 2017-04-04 | Disposition: A | Discharge status: Home / Self Care | Payer: Medicare HMO | Source: Ambulatory Visit | Attending: Pulmonary Disease | Admitting: Pulmonary Disease

## 2017-04-04 DIAGNOSIS — R911 Solitary pulmonary nodule: Secondary | ICD-10-CM

## 2017-04-06 ENCOUNTER — Other Ambulatory Visit: Payer: Self-pay | Admitting: Primary Care

## 2017-04-06 DIAGNOSIS — I10 Essential (primary) hypertension: Secondary | ICD-10-CM

## 2017-04-07 NOTE — Telephone Encounter (Signed)
Ok to refill? Electronically refill request for hydrALAZINE (APRESOLINE) 10 MG tablet  Last prescribed on and seen on 12/31/2016

## 2017-04-07 NOTE — Telephone Encounter (Signed)
Please send to cardiologist. 

## 2017-04-09 MED ORDER — HYDRALAZINE HCL 10 MG PO TABS: ORAL_TABLET | ORAL | 0 refills | Status: DC

## 2017-05-05 ENCOUNTER — Encounter: Payer: Self-pay | Admitting: Neurology

## 2017-05-05 ENCOUNTER — Ambulatory Visit: Payer: Medicare HMO | Admitting: Neurology

## 2017-05-05 DIAGNOSIS — G20 Parkinson's disease: Secondary | ICD-10-CM

## 2017-05-05 MED ORDER — RASAGILINE MESYLATE 1 MG PO TABS: 1.0000 mg | ORAL_TABLET | Freq: Every day | ORAL | 3 refills | Status: DC

## 2017-05-05 MED ORDER — CARBIDOPA-LEVODOPA 25-100 MG PO TABS: 1.5000 | ORAL_TABLET | Freq: Four times a day (QID) | ORAL | 3 refills | Status: DC

## 2017-05-05 NOTE — Patient Instructions (Addendum)
Your exam is fairly stable, nevertheless, your fall is is real! Let's keep your medications the same. I renewed your prescriptions.

## 2017-05-05 NOTE — Progress Notes (Signed)
Subjective:    Patient ID: William [name] is a 84 y.o. male.  HPI    Interim history:   William [name] is an 84 year old right-handed gentleman with an underlying medical history of right TIA in January 2003, SDH (s/p left craniotomy in August 2006), hypertension, hypothyroidism, thyroid cancer, partial onset seizures (off Dilantin), lumbar spine disease, status post lower back surgery at L4-5 in January 2014, renal tumor, status post kidney tumor ablative surgery in March 2014, who presents for followup consultation of his right-sided predominant [name]. He is accompanied by William [name] (longterm GF) again today. I last saw him on 12/31/2016, at which time he reported intermittent lightheadedness. He had some blood pressure fluctuations with really high values at times. He was hospitalized in August 2018 for a non-STEMI. He had significant orthostatic hypotension during the office visit. He was advised to talk to his providers about reducing his blood pressure medication. He was advised to not continue with the boxing classes for [name] but exercise within his limitations at home. He was advised to try melatonin at night for sleep issues.  Today, 05/05/2017 (all dictated new, as well as above notes, some dictation done in note pad or Word, outside of chart, may appear as copied):  He reports doing well. No falls, but has had some close calls, no recent illness, will be moving with William [name] into a condo by the end of this year or close to early next year. He feels well, exercise has been a little bit less due to weather issues. He has not resumed the boxing class since his non-STEMI. We mutually agreed to have him work on less strenuous exercise, walking and so forth. They will have access to the gym and pull at [facility] as they are new residents soon. Both he and William [name] are planning to take advantage of that access. Blood pressure has been more stable. He had a follow-up with  cardiology at the end of November and I reviewed the note. He was also referred for a spot on the lung to pulmonology. He had a follow-up CT chest which showed improved findings actually.  The patient's allergies, current medications, family history, past medical history, past social history, past surgical history and problem list were reviewed and updated as appropriate.    Previously (copied from previous notes for reference):   I saw him on 06/27/2016, at which time he reported doing well, he had a trip to [place] in [place] planned for springtime. He had no recent falls. He was supposed to be on a smaller dose of Eliquis, namely 2.5 mg twice a day. He continued to participate in the boxing class for [name] and felt that they were helpful.    I saw him on 03/05/2016, at which time he reported doing okay. He was during his stay active, was participating in [place] steady boxing, which he felt was helpful. He was avoiding strenuous parts of the exercise. He was interested in participating in a new drug study at [facility]. This was to investigate apomorphine sublingual to decrease off time. He was on Azilect once daily. He was taking Sinemet 4 times a day. He was trying to hydrate well, reported no recent falls. I suggested he increase the Sinemet to 1-1/2 pills 4 times a day. I suggested he continue with generic Azilect once daily.   I saw him on 10/26/2015, at which time he reported doing quite well, had some stumbling episodes, no actual falls, was trying to  drink enough water and was still physically active. He had signed up for rock steady boxing classes but William [name] noted that he would get too exhausted.   I saw him on 06/22/2015, at which time he reported doing fairly well. He had finished outpatient OT, PT, and ST. Unfortunately, he did take a fall in the garage couple of months prior as he was coming in from the graduate, stepped backwards on the stairs and slipped off falling  backwards, landed on his behind, head struck the car, he denied loss of consciousness or headache or bruise and no sequelae were reported. He was having rails installed in the garage entrance. Was still trying to stay active, playing golf. We mutually agreed to continue with Sinemet 4 times a day and Azilect once daily. We talked about fall risk and gait safety and fall precautions.   I saw him on 02/23/2015, at which time he reported doing fairly well. He was playing golf regularly. His right knee was bothering him. He had no recent falls. He was trying to hydrate well enough. He had no new issues with A. fib and no new complaints otherwise. I noted right knee swelling. He was advised to follow-up with his primary care physician for this. We mutually agreed to keep his Parkinson's medications the same.    I saw him on 11/01/2014 at which time he reported a recent diagnosis with A. fib. He was admitted to the hospital in April. I reviewed the hospital records from 07/17/2014 through 07/18/2014. He was started on Xarelto. He was then re-admitted on 08/09/14 to 08/10/14 due to altered mental status and slurring of speech and was suspected to have a TIA. He was seen by neuro in consultation and a baby aspirin was added to Xarelto. He presented back to the ER on 08/18/14. He a [place] wo contrast on  08/18/2014 , which was negative for any acute changes. He was seen by the Neurologist in the emergency room and it was felt that a full TIA workup was not necessary at the time. He had presented with hypertension and some slurring of speech. He was switched from Xarelto to Eliquis, because the Xarelto is not on formulary at the [place].  He endorsed recent stressors, including the recent passing of his younger brother a week prior with advanced Alzheimer's disease.    He had an MRI brain and MRA head on 08/09/14: MRI HEAD: No acute intracranial process, specifically no acute ischemia. Involutional changes. Mild white matter changes  suggest chronic small vessel ischemic disease. MRA HEAD: No acute large vessel occlusion or high-grade stenosis. Mid grade stenosis RIGHT P2/3 segment.    I suggested we continue with Sinemet at 4 times a day. He was advised to continue with Azilect as well. He was advised to drink more water. In the interim, we restarted outpatient physical therapy, occupational therapy and speech therapy.   I saw him on 04/29/2014, at which time he reported doing well overall but he was more fatigued and had some excessive daytime somnolence. Memory was stable. His mood was stable. He noted some blood pressure fluctuations. Sometimes he had a dull headache and sometimes he felt lightheaded. He had some anxiety over his lady friend's health, as she had fallen and broken rib. He continued to walk regularly and played golf. He had no recent falls. I asked him to continue with his medications, Azilect once daily and Sinemet 4 times a day.   I saw him on 10/28/2013, at which  time he reported doing well. In particular, had no cognitive complaints, no mood issues, and continued to play golf 3 times a week. He was driving well. I suggested an increase in his levodopa to 1 pill 4 times a day of the 25-100 milligrams strength. [facility] and had seen a [place] neurologist as well.      I saw him on 12/07/2012, at which time I felt that he was doing well on Azilect and levodopa. He called in December 2014 with problems with blood pressure fluctuations. He was wondering if this came from the [place] but I did not think it was due to the Azilect per se. I was reluctant to take him off of it.    I first met him on 06/03/2012 and he previously followed by Dr. Morene Antu. He has had primarily right-sided symptoms with regards to his Parkinson's, diagnosed in 2010 with symptoms dating back to late 2009 or early 2010. He had briefly tried Mirapex but was taken off d/t hypotension. L spine MRI in June  2013 showed renal cysts, degenerative joint disease most prominent at L4-5. He tried acupuncture. His MRI of the lumbar spine showed abnormalities with his kidney with a cyst and a tumor and he had ablative surgery on 07/10/12 and had a FU CT done. He had lower back surgery on 05/14/12.  I saw him back on 09/03/2012 and I suggested starting Azilect. He has been tolerating it well and both he and his girlfriend felt that he did better with it in terms of dexterity and fine motor control. He had some hypotension and lightheadedness and reduced his BP medication. He has been having some issue with gout.      His Past Medical History Is Significant For: Past Medical History:  Diagnosis Date  . A-fib ([place])   . Arthritis    "right knee" (08/09/2014)  . Basal cell carcinoma   . Benign prostatic hypertrophy with urinary obstruction   . Chronic anticoagulation 12/04/2016  . Complication of anesthesia    "difficulty intubation, had to use fiberoptic 2006"  . Difficult intubation   . DJD (degenerative joint disease)   . Gout   . Hx of colonic polyps   . Hyperlipidemia   . Hypertension    sees [name]  . Hypothyroidism   . Kidney tumor    "tumor on cyst on kidney"   . 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 Mini stroke ([place])   . Parkinson disease ([place])   . Pneumonia    hx of in 1952  . Primary skin squamous cell carcinoma   . Subdural hematoma ([place]) 2006  . TIA (transient ischemic attack)    pt does not recall this hx "but may have had one today" (08/09/2014)    His Past Surgical History Is Significant For: Past Surgical History:  Procedure Laterality Date  . APPENDECTOMY    . BACK SURGERY    . CATARACT EXTRACTION W/ INTRAOCULAR LENS  IMPLANT, BILATERAL Bilateral ~ 1998  . CHOLECYSTECTOMY    . CRYOABLATION  07-10-12   "tumor on cyst on my kidney; had an ablation"  . EYE SURGERY    . INGUINAL HERNIA REPAIR Right   . IR GENERIC HISTORICAL  11/09/2015   IR  RADIOLOGIST EVAL & MGMT 11/09/2015 [name], MD [facility] INTERV RAD  . IR RADIOLOGIST EVAL & MGMT  08/13/2016  . KIDNEY SURGERY    .  KNEE ARTHROSCOPY Right   . LEFT HEART CATH AND CORONARY ANGIOGRAPHY N/A 12/05/2016   Procedure: LEFT HEART CATH AND CORONARY ANGIOGRAPHY;  Surgeon: [name], [name], MD;  Location: [place] CV LAB;  Service: Cardiovascular;  Laterality: N/A;  . LUMBAR LAMINECTOMY/DECOMPRESSION MICRODISCECTOMY  05/14/2012   Procedure: LUMBAR LAMINECTOMY/DECOMPRESSION MICRODISCECTOMY 1 LEVEL;  Surgeon: [name], MD;  Location: [place] NEURO ORS;  Service: Neurosurgery;  Laterality: Left;  Left Lumbar four-five Laminectomy/Diskectomy/Far lateral diskectomy  . SKIN CANCER EXCISION  "several"   "back of my neck; right eye; clavicle; nose"  . SUBDURAL HEMATOMA EVACUATION VIA CRANIOTOMY  2006  . THYROIDECTOMY     medullary carcinoma  . TONSILLECTOMY      His Family History Is Significant For: Family History  Problem Relation Age of Onset  . Stroke Mother   . Stroke Father   . Heart disease Father   . Alzheimer's disease Brother   . Alzheimer's disease Sister     His Social History Is Significant For: Social History   Socioeconomic History  . Marital status: Widowed    Spouse name: None  . Number of children: 3  . Years of education: 54  . Highest education level: None  Social Needs  . Financial resource strain: None  . Food insecurity - worry: None  . Food insecurity - inability: None  . Transportation needs - medical: None  . Transportation needs - non-medical: None  Occupational History  . Occupation: retired  Tobacco Use  . Smoking status: Former Smoker    Packs/day: 0.80    Years: 15.00    Pack years: 12.00    Types: Cigarettes    Last attempt to quit: 04/15/1965    Years since quitting: 52.0  . Smokeless tobacco: Never Used  Substance and Sexual Activity  . Alcohol use: Yes    Alcohol/week: 3.0 oz    Types: 5 Glasses of wine per week    Comment: 1  glass of wine every night (6oz glass)  . Drug use: No  . Sexual activity: Yes  Other Topics Concern  . None  Social History Narrative   Married.   Lives at home with significant other.   Retried. 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   Enjoys golfing, yard work, spending time with family.      His Allergies Are:  No Known Allergies:   His Current Medications Are:  Outpatient Encounter Medications as of 05/05/2017  Medication Sig  . amLODipine (NORVASC) 2.5 MG tablet Take 1 tablet (2.5 mg total) by mouth daily. To take with amlodipine 15m for total of 7.566mdaily  . amLODipine (NORVASC) 5 MG tablet Take 1 tablet (5 mg total) by mouth daily. To take with 2.5 mg to make 7.5 mg daily  . apixaban (ELIQUIS) 2.5 MG TABS tablet Take 1 tablet (2.5 mg total) by mouth 2 (two) times daily.  . carbidopa-levodopa (SINEMET IR) 25-100 MG tablet Take 1.5 tablets by mouth 4 (four) times daily. At 8, 12, 4 PM, and 8 PM  . hydrALAZINE (APRESOLINE) 10 MG tablet Take 1 tablet by mouth up to three times daily for systolic blood pressure readings above 170.  . levothyroxine (SYNTHROID, LEVOTHROID) 100 MCG tablet TAKE 1 TABLET EVERY DAY BEFORE BREAKFAST  . losartan (COZAAR) 100 MG tablet Take 1 tablet (100 mg total) by mouth daily.  . Multiple Vitamin (MULTIVITAMIN WITH MINERALS) TABS Take 1 tablet by mouth daily. Reported on 08/01/2015  . nitroGLYCERIN (NITROSTAT) 0.4 MG SL tablet  Place 1 tablet (0.4 mg total) under the tongue every 5 (five) minutes x 3 doses as needed for chest pain.  . Probiotic Product (ALIGN) 4 MG CAPS Take 1 capsule by mouth daily.  . rasagiline (AZILECT) 1 MG TABS tablet Take 1 tablet (1 mg total) by mouth daily.  . simvastatin (ZOCOR) 40 MG tablet Take 1 tablet (40 mg total) by mouth at bedtime.   No facility-administered encounter medications on file as of 05/05/2017.   :  Review of Systems:  Out of a complete 14 point review of systems, all are reviewed and negative with the  exception of these symptoms as listed below: Review of Systems  Neurological:       Pt presents today to discuss his PD. Pt says that he is doing well. Pt has not had any falls but he has stumbled and caught himself on occasions.    Objective:  Neurological Exam  Physical Exam Physical Examination:   Vitals:   05/05/17 0920  BP: 121/71  Pulse: (!) 46    General Examination: The patient is a very pleasant 84 y.o. male in no acute distress. He appears well-developed and well-nourished and well groomed. Good spirits.   HEENT:Normocephalic, atraumatic, pupils are equal, round and reactive to light and accommodation. Extraocular trackingshows mild saccadic breakdown. Speech is moderately hypophonic and mildly dysarthric. Hearing is grossly intact. Oropharynx exam reveals mild mouth dryness, tongue protrudes centrally and palate elevates symmetrically. No other abnormal findings, he has moderate nuchal rigidity and decrease in passive range of motion.  Chest:Clear to auscultation without wheezing, rhonchi or crackles noted.  Heart:S1+S2+0, regular and normal without murmurs, rubs or gallops noted.   Abdomen:Soft, non-tender and non-distended with normal bowel sounds appreciated on auscultation.  Extremities:There is nopitting edema in the distal lower extremities bilaterally.   Skin: Warm and dry without trophic changes noted. Mild bruising on hands, stable, chronic.  Musculoskeletal: exam reveals no obvious joint deformities, tenderness or joint swelling or erythema.   Neurologically:  Mental status: The patient is awake, alert and oriented in all 4 spheres. Hisimmediate and remote memory, attention, language skills and fund of knowledge are appropriate. There is no evidence of aphasia, agnosia, apraxia or anomia. Speech is clear with normal prosody and enunciation. Thought process is linear. Mood is normaland affect is normal.  Cranial nerves II - XII are as described  above under HEENT exam.  Motor exam: Normal bulk, and strength for age. He has increased tone in the right more than left upper and lower extremities. He has an intermittent resting tremor in both upper extremities. Fine motor skills are moderately impaired on the right and slightly better on the left. Reflexes are 1+ throughout. He stands up with mild difficulty and pushes himself up. Posture is good today. He walks with decreased stride length and decrease pace, slightly decreased arm swing bilaterally but overall walks better today. Sensory exam is intact to light touch. Cerebellar testing shows no additional concern for dysmetria or intention tremor or gait ataxia.  Assessment and Plan:   In summary, William Ivey Broughtonis a very pleasant 84 year old malewith an underlying medical history of right TIA in January 2003, SDH (s/p left craniotomy in August 2006), hypertension, hypothyroidism, thyroid cancer, partial onset seizures (off Dilantin), lumbar spine disease, status-post lower back surgery at L4-5 in January 2014, renal tumor, status post kidney tumor ablative surgery in March 2014, who presents for followup consultation of his right-sided predominant [name], complicated by a recent heart  attack, more frailty with time, advancing age and likely some autonomic dysregulation and fall risk, thankfully no recent falls. He was diagnosed with [name] in 2010, symptoms date back to late 2009. He has remained fairly stable over the years but had some progression with time as expected. He benefited from an increase in his Sinemet, which is currently 1-1/2 pills 4 times a day and he also is on generic Azilect once daily. I renewed his prescriptions for 90 days supply and we will fax the prescriptions to the [place]. We talked about fall risk. We talked about healthy lifestyle in maintaining an exercise routine. He is typically quite good without. He looks well today. I suggested a  four-month recheck, sooner if needed. I answered all their questions today and the patient and William [name] were in agreement.  I spent 25 minutes in total face-to-face time with the patient, more than 50% of which was spent in counseling and coordination of care, reviewing test results, reviewing medication and discussing or reviewing the diagnosis of PD, its prognosis and treatment options. Pertinent laboratory and imaging test results that were available during this visit with the patient were reviewed by me and considered in my medical decision making (see chart for details).

## 2017-05-07 ENCOUNTER — Other Ambulatory Visit: Payer: Self-pay | Admitting: Dermatology

## 2017-05-07 DIAGNOSIS — L905 Scar conditions and fibrosis of skin: Secondary | ICD-10-CM | POA: Diagnosis not present

## 2017-05-07 DIAGNOSIS — Z85828 Personal history of other malignant neoplasm of skin: Secondary | ICD-10-CM | POA: Diagnosis not present

## 2017-05-07 DIAGNOSIS — C44722 Squamous cell carcinoma of skin of right lower limb, including hip: Secondary | ICD-10-CM | POA: Diagnosis not present

## 2017-05-12 DIAGNOSIS — R69 Illness, unspecified: Secondary | ICD-10-CM | POA: Diagnosis not present

## 2017-05-15 ENCOUNTER — Ambulatory Visit: Payer: Medicare HMO | Admitting: Pharmacist

## 2017-05-15 VITALS — BP 118/68 | HR 47

## 2017-05-15 DIAGNOSIS — I10 Essential (primary) hypertension: Secondary | ICD-10-CM | POA: Diagnosis not present

## 2017-05-15 DIAGNOSIS — I482 Chronic atrial fibrillation, unspecified: Secondary | ICD-10-CM

## 2017-05-15 MED ORDER — SIMVASTATIN 40 MG PO TABS: 40.0000 mg | ORAL_TABLET | Freq: Every day | ORAL | 1 refills | Status: DC

## 2017-05-15 MED ORDER — APIXABAN 2.5 MG PO TABS: 2.5000 mg | ORAL_TABLET | Freq: Two times a day (BID) | ORAL | 4 refills | Status: AC

## 2017-05-15 MED ORDER — AMLODIPINE BESYLATE 5 MG PO TABS
5.0000 mg | ORAL_TABLET | Freq: Every day | ORAL | 3 refills | Status: DC
Start: 2017-05-15 — End: 2017-05-19

## 2017-05-15 MED ORDER — LOSARTAN POTASSIUM 100 MG PO TABS
100.0000 mg | ORAL_TABLET | Freq: Every day | ORAL | 3 refills | Status: DC
Start: 2017-05-15 — End: 2017-05-19

## 2017-05-15 MED ORDER — AMLODIPINE BESYLATE 2.5 MG PO TABS: 2.5000 mg | ORAL_TABLET | Freq: Every day | ORAL | 3 refills | Status: DC

## 2017-05-15 NOTE — Progress Notes (Signed)
HPI:  William [name] is a 84 y.o. male patient of Dr Percival Spanish, with a PMH below who presents today for hypertension clinic follow up.   His medical history is significant for PAF (on Eliquis), chronic renal insufficiency (stage 3), RBBB, labile hypertension, and [name].  He was admitted to [facility] on Aug 21 with a NSTEMI and discharged 2 days later.   Since his last visit we changed his amodipine 5mg  every evening and 2.5mg  at bedtime. Reports feeling better and greatly improved.  Reports ONLY 1 episode of dizziness after on standing but denies falls or any other symptoms.   Blood Pressure Goal:  150/80 (due to Parkinson's, age and wide range of current home readings)  Current Medications:  Amlodipine 5mg  at 6pm and 2.5mg  at 11pm  Losartan 100 mg every morning  Hydralazine 10 mg prn SBP > 180 (none this month)   Family Hx:  Father had stroke at 30 (died); mother from [place] at 87  Sister 9, brother 76 both from [place]  2 of 3 sons with hypertension (both in 4's)  Social Hx:  No tobacco, quit 52 years ago; glass of wine most nights (red); 1 coffee/tea daily Diet:  Mostly home cooked meals (esp since MI); no added salt; mostly chicken and fish, occasional beef (taco); fresh fruits throughout summer; stir fry veggies with olive oil; enjoys sweets - ice cream, cookies;   Exercise:  Some stretching, stationary bike at home  Home BP readings:  19 readings; average 132/76 (pulse 45-58 bpm)  Noted 2 BP readings at low 180 - patient has hydral;azine PRN for this  ONLY 2 reading with systolic BP < 048G but asymptomatic  Home cuff 84 years old Medline brand, read within 5 points of office cuff.    Intolerances:   NKDA  CrCl cannot be calculated (Patient's most recent lab result is older than the maximum 21 days allowed.).  Wt Readings from Last 3 Encounters:  05/05/17 185 lb (83.9 kg)  03/27/17 180 lb (81.6 kg)  12/31/16 179 lb 1.9 oz (81.2 kg)   BP Readings  from Last 3 Encounters:  05/15/17 118/68  05/05/17 121/71  03/27/17 130/72   Pulse Readings from Last 3 Encounters:  05/15/17 (!) 47  05/05/17 (!) 46  03/27/17 (!) 45    Current Outpatient Medications  Medication Sig Dispense Refill  . amLODipine (NORVASC) 2.5 MG tablet Take 1 tablet (2.5 mg total) by mouth daily. To take with amlodipine 5mg  for total of 7.5mg  daily 90 tablet 3  . amLODipine (NORVASC) 5 MG tablet Take 1 tablet (5 mg total) by mouth daily. To take with 2.5 mg to make 7.5 mg daily 90 tablet 3  . apixaban (ELIQUIS) 2.5 MG TABS tablet Take 1 tablet (2.5 mg total) by mouth 2 (two) times daily. 60 tablet 4  . carbidopa-levodopa (SINEMET IR) 25-100 MG tablet Take 1.5 tablets by mouth 4 (four) times daily. At 8, 12, 4 PM, and 8 PM 540 tablet 3  . hydrALAZINE (APRESOLINE) 10 MG tablet Take 1 tablet by mouth up to three times daily for systolic blood pressure readings above 170. 90 tablet 0  . levothyroxine (SYNTHROID, LEVOTHROID) 100 MCG tablet TAKE 1 TABLET EVERY DAY BEFORE BREAKFAST 90 tablet 1  . losartan (COZAAR) 100 MG tablet Take 1 tablet (100 mg total) by mouth daily. 90 tablet 3  . Multiple Vitamin (MULTIVITAMIN WITH MINERALS) TABS Take 1 tablet by mouth daily. Reported on 08/01/2015    .  nitroGLYCERIN (NITROSTAT) 0.4 MG SL tablet Place 1 tablet (0.4 mg total) under the tongue every 5 (five) minutes x 3 doses as needed for chest pain. 25 tablet 12  . Probiotic Product (ALIGN) 4 MG CAPS Take 1 capsule by mouth daily.    . rasagiline (AZILECT) 1 MG TABS tablet Take 1 tablet (1 mg total) by mouth daily. 90 tablet 3  . simvastatin (ZOCOR) 40 MG tablet Take 1 tablet (40 mg total) by mouth at bedtime. 90 tablet 1   No current facility-administered medications for this visit.     Past Medical History:  Diagnosis Date  . A-fib ([place])   . Arthritis    "right knee" (08/09/2014)  . Basal cell carcinoma   . Benign prostatic hypertrophy with urinary obstruction   . Chronic  anticoagulation 12/04/2016  . Complication of anesthesia    "difficulty intubation, had to use fiberoptic 2006"  . Difficult intubation   . DJD (degenerative joint disease)   . Gout   . Hx of colonic polyps   . Hyperlipidemia   . Hypertension    sees [name]  . Hypothyroidism   . Kidney tumor    "tumor on cyst on kidney"   . 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 Mini stroke ([place])   . Parkinson disease ([place])   . Pneumonia    hx of in 1952  . Primary skin squamous cell carcinoma   . Subdural hematoma ([place]) 2006  . TIA (transient ischemic attack)    pt does not recall this hx "but may have had one today" (08/09/2014)    Blood pressure 118/68, pulse (!) 47, SpO2 97 %.  Standing  Pressure 120/64  Essential hypertension Blood pressure remain well controlled in office but highly variable at home due to neurological disease.  Patient reports feeling better since last change in BP medication about 4 weeks ago. He denies issues with orthostatic pressures and reports only 4 readings above goal of 150/90. He has hydralazine 10mg   available  to use as needed for BP > 180 but denies need during last 30 days. Will continue current therapy without changes today and follow up in 8 weeks.   Joanna Borawski Rodriguez-Guzman PharmD, BCPS, [place] [address] [place] [address] 05/16/2017 3:00 PM

## 2017-05-15 NOTE — Patient Instructions (Addendum)
Return for a follow up appointment in 8 weeks  Check your blood pressure at home daily (if able) and keep record of the readings.  Take your BP meds as follows: *Continue all medication without changes**  Bring all of your meds, your BP cuff and your record of home blood pressures to your next appointment.  Exercise as you're able, try to walk approximately 30 minutes per day.  Keep salt intake to a minimum, especially watch canned and prepared boxed foods.  Eat more fresh fruits and vegetables and fewer canned items.  Avoid eating in fast food restaurants.    HOW TO TAKE YOUR BLOOD PRESSURE: . Rest 5 minutes before taking your blood pressure. .  Don't smoke or drink caffeinated beverages for at least 30 minutes before. . Take your blood pressure before (not after) you eat. . Sit comfortably with your back supported and both feet on the floor (don't cross your legs). . Elevate your arm to heart level on a table or a desk. . Use the proper sized cuff. It should fit smoothly and snugly around your bare upper arm. There should be enough room to slip a fingertip under the cuff. The bottom edge of the cuff should be 1 inch above the crease of the elbow. . Ideally, take 3 measurements at one sitting and record the average.

## 2017-05-16 ENCOUNTER — Encounter: Payer: Self-pay | Admitting: Pharmacist

## 2017-05-16 NOTE — Assessment & Plan Note (Signed)
Blood pressure remain well controlled in office but highly variable at home due to neurological disease.  Patient reports feeling better since last change in BP medication about 4 weeks ago. He denies issues with orthostatic pressures and reports only 4 readings above goal of 150/90. He has hydralazine 10mg   available  to use as needed for BP > 180 but denies need during last 30 days. Will continue current therapy without changes today and follow up in 8 weeks.

## 2017-05-19 ENCOUNTER — Ambulatory Visit (INDEPENDENT_AMBULATORY_CARE_PROVIDER_SITE_OTHER): Payer: Medicare HMO | Admitting: Primary Care

## 2017-05-19 ENCOUNTER — Encounter: Payer: Self-pay | Admitting: Primary Care

## 2017-05-19 VITALS — BP 120/58 | HR 50 | Temp 98.0°F | Ht 76.0 in | Wt 184.8 lb

## 2017-05-19 DIAGNOSIS — E039 Hypothyroidism, unspecified: Secondary | ICD-10-CM

## 2017-05-19 DIAGNOSIS — M7021 Olecranon bursitis, right elbow: Secondary | ICD-10-CM | POA: Diagnosis not present

## 2017-05-19 DIAGNOSIS — I10 Essential (primary) hypertension: Secondary | ICD-10-CM

## 2017-05-19 DIAGNOSIS — E7849 Other hyperlipidemia: Secondary | ICD-10-CM

## 2017-05-19 MED ORDER — AMLODIPINE BESYLATE 5 MG PO TABS: 5.0000 mg | ORAL_TABLET | Freq: Every day | ORAL | 3 refills | Status: DC

## 2017-05-19 MED ORDER — LOSARTAN POTASSIUM 100 MG PO TABS: 100.0000 mg | ORAL_TABLET | Freq: Every day | ORAL | 3 refills | Status: DC

## 2017-05-19 MED ORDER — HYDRALAZINE HCL 10 MG PO TABS: ORAL_TABLET | ORAL | 0 refills | Status: DC

## 2017-05-19 MED ORDER — LEVOTHYROXINE SODIUM 100 MCG PO TABS: ORAL_TABLET | ORAL | 1 refills | Status: DC

## 2017-05-19 MED ORDER — AMLODIPINE BESYLATE 2.5 MG PO TABS: 2.5000 mg | ORAL_TABLET | Freq: Every day | ORAL | 3 refills | Status: DC

## 2017-05-19 MED ORDER — SIMVASTATIN 40 MG PO TABS: 40.0000 mg | ORAL_TABLET | Freq: Every day | ORAL | 1 refills | Status: DC

## 2017-05-19 NOTE — Patient Instructions (Signed)
Please notify me if you develop redness, increased swelling, pain to the site.   Refrain from heavy lifting for a few days.  It was a pleasure to see you today!

## 2017-05-19 NOTE — Progress Notes (Signed)
Subjective:    Patient ID: William [name], male    DOB: December 14, 1928, 84 y.o.   MRN: [id number]  HPI  Mr. Bernasconi is an 84 year old male who presents today with a chief complaint of elbow swelling. The swelling is located to the right elbow for which he first noticed Wednesday last week after lifting heavy tile. He then went to the gym several days later. Since then he's noticed gradual swelling. He denies pain, warmth, fevers, fatigue.   He is also switching to the mail order pharmacy and is needing new prescriptions.  Review of Systems  Constitutional: Negative for chills and fever.  Musculoskeletal:       Right elbow swelling.  Skin: Negative for color change.       Past Medical History:  Diagnosis Date  . A-fib ([place])   . Arthritis    "right knee" (08/09/2014)  . Basal cell carcinoma   . Benign prostatic hypertrophy with urinary obstruction   . Chronic anticoagulation 12/04/2016  . Complication of anesthesia    "difficulty intubation, had to use fiberoptic 2006"  . Difficult intubation   . DJD (degenerative joint disease)   . Gout   . Hx of colonic polyps   . Hyperlipidemia   . Hypertension    sees [name]  . Hypothyroidism   . Kidney tumor    "tumor on cyst on kidney"   . 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 Mini stroke ([place])   . Parkinson disease ([place])   . Pneumonia    hx of in 1952  . Primary skin squamous cell carcinoma   . Subdural hematoma ([place]) 2006  . TIA (transient ischemic attack)    pt does not recall this hx "but may have had one today" (08/09/2014)     Social History   Socioeconomic History  . Marital status: Widowed    Spouse name: Not on file  . Number of children: 3  . Years of education: 25  . Highest education level: Not on file  Social Needs  . Financial resource strain: Not on file  . Food insecurity - worry: Not on file  . Food insecurity - inability: Not on file  . Transportation needs -  medical: Not on file  . Transportation needs - non-medical: Not on file  Occupational History  . Occupation: retired  Tobacco Use  . Smoking status: Former Smoker    Packs/day: 0.80    Years: 15.00    Pack years: 12.00    Types: Cigarettes    Last attempt to quit: 04/15/1965    Years since quitting: 52.1  . Smokeless tobacco: Never Used  Substance and Sexual Activity  . Alcohol use: Yes    Alcohol/week: 3.0 oz    Types: 5 Glasses of wine per week    Comment: 1 glass of wine every night (6oz glass)  . Drug use: No  . Sexual activity: Yes  Other Topics Concern  . Not on file  Social History Narrative   Married.   Lives at home with significant other.   Retried. 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   Enjoys golfing, yard work, spending time with family.      Past Surgical History:  Procedure Laterality Date  . APPENDECTOMY    . BACK SURGERY    . CATARACT EXTRACTION W/ INTRAOCULAR LENS  IMPLANT, BILATERAL Bilateral ~ 1998  . CHOLECYSTECTOMY    . CRYOABLATION  07-10-12   "tumor on cyst on my kidney; had an ablation"  . EYE SURGERY    . INGUINAL HERNIA REPAIR Right   . IR GENERIC HISTORICAL  11/09/2015   IR RADIOLOGIST EVAL & MGMT 11/09/2015 [name], MD [facility] INTERV RAD  . IR RADIOLOGIST EVAL & MGMT  08/13/2016  . KIDNEY SURGERY    . KNEE ARTHROSCOPY Right   . LEFT HEART CATH AND CORONARY ANGIOGRAPHY N/A 12/05/2016   Procedure: LEFT HEART CATH AND CORONARY ANGIOGRAPHY;  Surgeon: [name], [name], MD;  Location: [facility];  Service: Cardiovascular;  Laterality: N/A;  . LUMBAR LAMINECTOMY/DECOMPRESSION MICRODISCECTOMY  05/14/2012   Procedure: LUMBAR LAMINECTOMY/DECOMPRESSION MICRODISCECTOMY 1 LEVEL;  Surgeon: [name], MD;  Location: [facility];  Service: Neurosurgery;  Laterality: Left;  Left Lumbar four-five Laminectomy/Diskectomy/Far lateral diskectomy  . SKIN CANCER EXCISION  "several"   "back of my neck; right eye; clavicle; nose"  . SUBDURAL HEMATOMA  EVACUATION VIA CRANIOTOMY  2006  . THYROIDECTOMY     medullary carcinoma  . TONSILLECTOMY      Family History  Problem Relation Age of Onset  . Stroke Mother   . Stroke Father   . Heart disease Father   . Alzheimer's disease Brother   . Alzheimer's disease Sister     No Known Allergies  Current Outpatient Medications on File Prior to Visit  Medication Sig Dispense Refill  . apixaban (ELIQUIS) 2.5 MG TABS tablet Take 1 tablet (2.5 mg total) by mouth 2 (two) times daily. 60 tablet 4  . carbidopa-levodopa (SINEMET IR) 25-100 MG tablet Take 1.5 tablets by mouth 4 (four) times daily. At 8, 12, 4 PM, and 8 PM 540 tablet 3  . Multiple Vitamin (MULTIVITAMIN WITH MINERALS) TABS Take 1 tablet by mouth daily. Reported on 08/01/2015    . nitroGLYCERIN (NITROSTAT) 0.4 MG SL tablet Place 1 tablet (0.4 mg total) under the tongue every 5 (five) minutes x 3 doses as needed for chest pain. 25 tablet 12  . Probiotic Product (ALIGN) 4 MG CAPS Take 1 capsule by mouth daily.    . rasagiline (AZILECT) 1 MG TABS tablet Take 1 tablet (1 mg total) by mouth daily. 90 tablet 3   No current facility-administered medications on file prior to visit.     BP (!) 120/58   Pulse (!) 50   Temp 98 F (36.7 C) (Oral)   Ht 6\' 4"  (1.93 m)   Wt 184 lb 12 oz (83.8 kg)   SpO2 97%   BMI 22.49 kg/m    Objective:   Physical Exam  Constitutional: He appears well-nourished.  Neck: Neck supple.  Cardiovascular: Normal rate.  Pulmonary/Chest: Effort normal and breath sounds normal.  Musculoskeletal:  Moderate swelling in circular pattern to right olecranon, appears to be olecranon bursitis.   Skin: Skin is warm and dry. No erythema.          Assessment & Plan:  Olecranon Bursitis:  Swelling to right elbow x 5 days. Exam today with classic presentation for olecranon bursitis. Consent signed. Site prepped with betadine solution. Pain Ease use for analgesia. Aspirated 17 cc of synovial fluid (dark yellow)  from site with 21 gauge needle. Cleansed site again, bandage applied. Discussed home care instructions. Discussed to notify for s/s of infection.  Patient tolerated well.  Pleas Koch, NP

## 2017-05-27 ENCOUNTER — Telehealth: Payer: Self-pay | Admitting: *Deleted

## 2017-05-27 NOTE — Telephone Encounter (Signed)
Representative from [employer] left a msg on the refill vm requesting clarification on patients amlodipine dose as they have an rx for 2.5 mg as well as 5 mg. Please call [phone] and provide reference [id number]. Thanks, MI

## 2017-05-28 NOTE — Telephone Encounter (Signed)
Patient takes 7.5 mg total each day, in divided doses.  Pharmacy aware and sending both strengths.  Patient aware

## 2017-05-29 ENCOUNTER — Encounter: Payer: Self-pay | Admitting: Internal Medicine

## 2017-05-29 ENCOUNTER — Ambulatory Visit: Payer: Self-pay | Admitting: *Deleted

## 2017-05-29 ENCOUNTER — Ambulatory Visit (INDEPENDENT_AMBULATORY_CARE_PROVIDER_SITE_OTHER): Payer: Medicare HMO | Admitting: Internal Medicine

## 2017-05-29 ENCOUNTER — Telehealth: Payer: Self-pay

## 2017-05-29 VITALS — BP 122/84 | HR 56 | Temp 97.6°F | Wt 182.0 lb

## 2017-05-29 DIAGNOSIS — M7021 Olecranon bursitis, right elbow: Secondary | ICD-10-CM | POA: Diagnosis not present

## 2017-05-29 NOTE — Telephone Encounter (Signed)
Pt's wife, [name], called stating that her husband's right elbow is leaking and has not gotten better after it was aspirated on 05/19/17; she says that it started leaking clear fluid 05/28/17; nurse triage initiated and recommendations made per protocol for pt to see physician within 24 hours; pt's states that they have a busy day tomorrow; they are offered and accepted appointment with Webb Silversmith today at 52; pt's wife verbalizes understanding Reason for Disposition . Very swollen joint  Answer Assessment - Initial Assessment Questions 1. LOCATION: "Where is the swelling?" (e.g., left, right, both elbows)     right 2. SIZE and DESCRIPTION: "What does the swelling look like?" (e.g., entire elbow, localized)     Entire elbow 3. ONSET: "When did the swelling start?" "Does it come and go, or is it there all the time?"     A week before 05/19/17 4. SETTING: "Has there been any recent work, exercise or other activity that involved that part of the body?"      Not used much since 05/19/17 MDs appointment 5. AGGRAVATING FACTORS: "What makes the elbow swelling worse?" (e.g., work, sports activities)     no 6. ASSOCIATED SYMPTOMS: "Is there any pain or redness?"     no 7. OTHER SYMPTOMS: "Do you have any other symptoms?" (e.g., fever)     Leaking clear fluid 8. PREGNANCY: "Is there any chance you are pregnant?" "When was your last menstrual period?"     n/a  Protocols used: ELBOW SWELLING-A-AH

## 2017-05-29 NOTE — Telephone Encounter (Signed)
Noted, discussed case with Rollene Fare.

## 2017-05-29 NOTE — Telephone Encounter (Signed)
Will send to PCP, but otherwise I will see him today at 2 o'clock

## 2017-05-29 NOTE — Telephone Encounter (Signed)
Copied from [place]. Topic: Inquiry >> May 29, 2017  8:59 AM Corie Chiquito, [place] wrote: Reason for CRM: Patient wife called to let Ms.Carlis Abbott know that her husband elbow is still swollen and it is now leaking.Stated that he isn't in any pain and there is no fever. She would like to speak with her or her nurse about this. She can be reached at [phone]  >> May 29, 2017  9:48 AM Helene Shoe, LPN wrote: Pt already has appt today at 2 PM with Avie Echevaria NP.

## 2017-05-29 NOTE — Patient Instructions (Signed)
Bursitis Bursitis is when the fluid-filled sac (bursa) that covers and protects a joint is swollen (inflamed). Bursitis is most common near joints, especially the knees, elbows, hips, and shoulders. Follow these instructions at home:  Take medicines only as told by your doctor.  If you were prescribed an antibiotic medicine, finish it all even if you start to feel better.  Rest the affected area as told by your doctor. ? Keep the area raised up. ? Avoid doing things that make the pain worse.  Apply ice to the injured area: ? Place ice in a plastic bag. ? Place a towel between your skin and the bag. ? Leave the ice on for 20 minutes, 2-3 times a day.  Use splints, braces, pads, or walking aids as told by your doctor.  Keep all follow-up visits as told by your doctor. This is important. Contact a doctor if:  You have more pain with home care.  You have a fever.  You have chills. This information is not intended to replace advice given to you by your health care provider. Make sure you discuss any questions you have with your health care provider. Document Released: 09/19/2009 Document Revised: 09/07/2015 Document Reviewed: 06/21/2013 Elsevier Interactive Patient Education  2018 Elsevier Inc.  

## 2017-06-02 ENCOUNTER — Telehealth: Payer: Self-pay | Admitting: Primary Care

## 2017-06-02 MED ORDER — PREDNISONE 10 MG PO TABS: ORAL_TABLET | ORAL | 0 refills | Status: DC

## 2017-06-02 NOTE — Telephone Encounter (Signed)
Copied from [place]. Topic: Quick Communication - See Telephone Encounter >> Jun 02, 2017 10:36 AM Synthia Innocent wrote: CRM for notification. See Telephone encounter for: patient was seen on 05/29/17, states a steroid was to be called in this week. [facility] [address]. Please advise  06/02/17.

## 2017-06-02 NOTE — Telephone Encounter (Signed)
Pred Taper sent to [facility]

## 2017-06-02 NOTE — Addendum Note (Signed)
Addended by: Jearld Fenton on: 06/02/2017 11:22 AM   Modules accepted: Orders

## 2017-06-04 ENCOUNTER — Encounter: Payer: Self-pay | Admitting: Internal Medicine

## 2017-06-04 NOTE — Progress Notes (Signed)
Subjective:    Patient ID: William [name], male    DOB: 1928-07-14, 84 y.o.   MRN: [id number]  HPI  Pt presents to the clinic today with c/o right elbow swelling. This started 2-3 weeks ago. He saw Allie Bossier, NP for the same. She removed some of the fluid from the bursa. He reports it reaccumulated within 2 days. It is not painful and it does not impair his range of motion at all. He did notice some clear discharge from it yesterday. He denies redness or warmth. He has not tried anything OTC for this.  Review of Systems   Past Medical History:  Diagnosis Date  . A-fib ([place])   . Arthritis    "right knee" (08/09/2014)  . Basal cell carcinoma   . Benign prostatic hypertrophy with urinary obstruction   . Chronic anticoagulation 12/04/2016  . Complication of anesthesia    "difficulty intubation, had to use fiberoptic 2006"  . Difficult intubation   . DJD (degenerative joint disease)   . Gout   . Hx of colonic polyps   . Hyperlipidemia   . Hypertension    sees [name]  . Hypothyroidism   . Kidney tumor    "tumor on cyst on kidney"   . 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 Mini stroke ([place])   . Parkinson disease ([place])   . Pneumonia    hx of in 1952  . Primary skin squamous cell carcinoma   . Subdural hematoma ([place]) 2006  . TIA (transient ischemic attack)    pt does not recall this hx "but may have had one today" (08/09/2014)    Current Outpatient Medications  Medication Sig Dispense Refill  . amLODipine (NORVASC) 2.5 MG tablet Take 1 tablet (2.5 mg total) by mouth daily. To take with amlodipine 5mg  for total of 7.5mg  daily 90 tablet 3  . amLODipine (NORVASC) 5 MG tablet Take 1 tablet (5 mg total) by mouth daily. To take with 2.5 mg to make 7.5 mg daily 90 tablet 3  . apixaban (ELIQUIS) 2.5 MG TABS tablet Take 1 tablet (2.5 mg total) by mouth 2 (two) times daily. 60 tablet 4  . carbidopa-levodopa (SINEMET IR) 25-100 MG tablet Take 1.5  tablets by mouth 4 (four) times daily. At 8, 12, 4 PM, and 8 PM 540 tablet 3  . hydrALAZINE (APRESOLINE) 10 MG tablet Take 1 tablet by mouth up to three times daily for systolic blood pressure readings above 170. 90 tablet 0  . levothyroxine (SYNTHROID, LEVOTHROID) 100 MCG tablet Take 1 tablet my mouth on an empty stomach with a full glass of water. 90 tablet 1  . losartan (COZAAR) 100 MG tablet Take 1 tablet (100 mg total) by mouth daily. 90 tablet 3  . Multiple Vitamin (MULTIVITAMIN WITH MINERALS) TABS Take 1 tablet by mouth daily. Reported on 08/01/2015    . nitroGLYCERIN (NITROSTAT) 0.4 MG SL tablet Place 1 tablet (0.4 mg total) under the tongue every 5 (five) minutes x 3 doses as needed for chest pain. 25 tablet 12  . Probiotic Product (ALIGN) 4 MG CAPS Take 1 capsule by mouth daily.    . rasagiline (AZILECT) 1 MG TABS tablet Take 1 tablet (1 mg total) by mouth daily. 90 tablet 3  . simvastatin (ZOCOR) 40 MG tablet Take 1 tablet (40 mg total) by mouth at bedtime. 90 tablet 1  . predniSONE (DELTASONE) 10 MG tablet Take 3 tabs on days  1-3, take 2 tabs on days 4-6, take 1 tab on days 7-9 18 tablet 0   No current facility-administered medications for this visit.     No Known Allergies  Family History  Problem Relation Age of Onset  . Stroke Mother   . Stroke Father   . Heart disease Father   . Alzheimer's disease Brother   . Alzheimer's disease Sister     Social History   Socioeconomic History  . Marital status: Widowed    Spouse name: Not on file  . Number of children: 3  . Years of education: 44  . Highest education level: Not on file  Social Needs  . Financial resource strain: Not on file  . Food insecurity - worry: Not on file  . Food insecurity - inability: Not on file  . Transportation needs - medical: Not on file  . Transportation needs - non-medical: Not on file  Occupational History  . Occupation: retired  Tobacco Use  . Smoking status: Former Smoker    Packs/day:  0.80    Years: 15.00    Pack years: 12.00    Types: Cigarettes    Last attempt to quit: 04/15/1965    Years since quitting: 52.1  . Smokeless tobacco: Never Used  Substance and Sexual Activity  . Alcohol use: Yes    Alcohol/week: 3.0 oz    Types: 5 Glasses of wine per week    Comment: 1 glass of wine every night (6oz glass)  . Drug use: No  . Sexual activity: Yes  Other Topics Concern  . Not on file  Social History Narrative   Married.   Lives at home with significant other.   Retried. 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   Enjoys golfing, yard work, spending time with family.       Constitutional: Denies fever, malaise, fatigue, headache or abrupt weight changes.  Musculoskeletal: Pt reports right elbow swelling. Denies decrease in range of motion, difficulty with gait, muscle pain or joint pain.    No other specific complaints in a complete review of systems (except as listed in HPI above).   Objective:   Physical Exam  BP 122/84   Pulse (!) 56   Temp 97.6 F (36.4 C) (Oral)   Wt 182 lb (82.6 kg)   SpO2 100%   BMI 22.15 kg/m  Wt Readings from Last 3 Encounters:  05/29/17 182 lb (82.6 kg)  05/19/17 184 lb 12 oz (83.8 kg)  05/05/17 185 lb (83.9 kg)    General: Appears his stated age, in NAD. Musculoskeletal: Normal flexion, extension and rotation of the right elbow. Olecranon bursitis noted of right elbow. No redness or warmth noted.  BMET    Component Value Date/Time   NA 144 12/12/2016 1228   K 4.7 12/12/2016 1228   CL 99 12/12/2016 1228   CO2 25 12/12/2016 1228   GLUCOSE 86 12/12/2016 1228   GLUCOSE 95 12/05/2016 0207   GLUCOSE 96 03/25/2006 0935   BUN 26 12/12/2016 1228   CREATININE 1.50 (H) 12/12/2016 1228   CREATININE 1.49 (H) 11/28/2015 1451   CALCIUM 8.8 12/12/2016 1228   GFRNONAA 42 (L) 12/12/2016 1228   GFRNONAA 38 (L) 11/03/2015 1633   GFRAA 48 (L) 12/12/2016 1228   GFRAA 44 (L) 11/03/2015 1633    Lipid Panel     Component Value  Date/Time   CHOL 116 12/05/2016 0207   TRIG 139 12/05/2016 0207   TRIG 101 03/25/2006 0935   HDL  46 12/05/2016 0207   CHOLHDL 2.5 12/05/2016 0207   VLDL 28 12/05/2016 0207   LDLCALC 42 12/05/2016 0207    CBC    Component Value Date/Time   WBC 5.7 12/05/2016 0207   RBC 4.67 12/05/2016 0207   HGB 13.5 12/05/2016 0207   HCT 43.7 12/05/2016 0207   PLT 142 (L) 12/05/2016 0207   MCV 93.6 12/05/2016 0207   MCH 28.9 12/05/2016 0207   MCHC 30.9 12/05/2016 0207   RDW 15.0 12/05/2016 0207   LYMPHSABS 1.0 12/03/2016 1850   MONOABS 0.5 12/03/2016 1850   EOSABS 0.1 12/03/2016 1850   BASOSABS 0.0 12/03/2016 1850    Hgb A1C Lab Results  Component Value Date   HGBA1C 5.5 08/10/2014            Assessment & Plan:   Right Olecranon Bursitis:  No need to remove more fluid No indication to inject with steroid Will try compression with ACE wrap eRx for Pred Taper x 9 days to help decrease inflammation  If no improvement, consider making an appt with Dr. Lum Babe, NP

## 2017-07-07 DIAGNOSIS — L578 Other skin changes due to chronic exposure to nonionizing radiation: Secondary | ICD-10-CM | POA: Diagnosis not present

## 2017-07-07 DIAGNOSIS — L819 Disorder of pigmentation, unspecified: Secondary | ICD-10-CM | POA: Diagnosis not present

## 2017-07-07 DIAGNOSIS — Z85828 Personal history of other malignant neoplasm of skin: Secondary | ICD-10-CM | POA: Diagnosis not present

## 2017-07-07 DIAGNOSIS — L570 Actinic keratosis: Secondary | ICD-10-CM | POA: Diagnosis not present

## 2017-07-07 DIAGNOSIS — L218 Other seborrheic dermatitis: Secondary | ICD-10-CM | POA: Diagnosis not present

## 2017-07-10 ENCOUNTER — Ambulatory Visit (INDEPENDENT_AMBULATORY_CARE_PROVIDER_SITE_OTHER): Payer: Medicare HMO | Admitting: Pharmacist

## 2017-07-10 VITALS — BP 118/78 | HR 48 | Ht 76.0 in | Wt 180.0 lb

## 2017-07-10 DIAGNOSIS — I10 Essential (primary) hypertension: Secondary | ICD-10-CM | POA: Diagnosis not present

## 2017-07-10 NOTE — Patient Instructions (Signed)
Return for a follow up appointment in 8 weeks  Check your blood pressure at home daily (if able) and keep record of the readings.  Take your BP meds as follows: *CONTINUE taking all medication as prescribed*  Bring all of your meds, your BP cuff and your record of home blood pressures to your next appointment.  Exercise as you're able, try to walk approximately 30 minutes per day.  Keep salt intake to a minimum, especially watch canned and prepared boxed foods.  Eat more fresh fruits and vegetables and fewer canned items.  Avoid eating in fast food restaurants.    HOW TO TAKE YOUR BLOOD PRESSURE: . Rest 5 minutes before taking your blood pressure. .  Don't smoke or drink caffeinated beverages for at least 30 minutes before. . Take your blood pressure before (not after) you eat. . Sit comfortably with your back supported and both feet on the floor (don't cross your legs). . Elevate your arm to heart level on a table or a desk. . Use the proper sized cuff. It should fit smoothly and snugly around your bare upper arm. There should be enough room to slip a fingertip under the cuff. The bottom edge of the cuff should be 1 inch above the crease of the elbow. . Ideally, take 3 measurements at one sitting and record the average.

## 2017-07-10 NOTE — Progress Notes (Signed)
HPI:  William [name] is a 84 y.o. male patient of Dr Percival Spanish, with a PMH below who presents today for hypertension clinic follow up.   His medical history is significant for PAF (on Eliquis), chronic renal insufficiency (stage 3), RBBB, labile hypertension, and [name].  He was admitted to [facility] on Aug 21 with a NSTEMI and discharged 2 days later.   Since his last visit we changed his amodipine 5mg  every late morning and 2.5mg  every evening. Reports feeling better and greatly improved. Decreased dizziness and no falls since last visit . Also reports not taking much PRN hydralazine anymore. Noted some drops in BP right after breakfast but nothing under 903'Y systolic.  Blood Pressure Goal:  150/80 (due to Parkinson's, age and wide range of current home readings)  Current Medications:  Amlodipine 5mg  at 11pm and 2.5mg  at 6pm  Losartan 100 mg every morning  Hydralazine 10 mg prn SBP > 180 (none this month)   Family History:  Father had stroke at 49 (died); mother from [place] at 52  Sister 70, brother 36 both from [place]  2 of 3 sons with hypertension (both in 80's)  Social History:  No tobacco, quit 52 years ago; glass of wine most nights (red); 1 coffee/tea daily  Diet:  Mostly home cooked meals (esp since MI); no added salt; mostly chicken and fish, occasional beef (taco); fresh fruits throughout summer; stir fry veggies with olive oil; enjoys sweets - ice cream, cookies;   Exercise:  Some stretching, stationary bike at home  Home BP readings:  No home readings provided for this visit  Home cuff 84 years old Medline brand, read within 5 points of office cuff.    Intolerances:   NKDA  Wt Readings from Last 3 Encounters:  07/10/17 180 lb (81.6 kg)  05/29/17 182 lb (82.6 kg)  05/19/17 184 lb 12 oz (83.8 kg)   BP Readings from Last 3 Encounters:  07/10/17 118/78  05/29/17 122/84  05/19/17 (!) 120/58   Pulse Readings from Last 3 Encounters:  07/10/17 (!)  48  05/29/17 (!) 56  05/19/17 (!) 50    Current Outpatient Medications  Medication Sig Dispense Refill  . amLODipine (NORVASC) 2.5 MG tablet Take 1 tablet (2.5 mg total) by mouth daily. To take with amlodipine 5mg  for total of 7.5mg  daily 90 tablet 3  . amLODipine (NORVASC) 5 MG tablet Take 1 tablet (5 mg total) by mouth daily. To take with 2.5 mg to make 7.5 mg daily 90 tablet 3  . apixaban (ELIQUIS) 2.5 MG TABS tablet Take 1 tablet (2.5 mg total) by mouth 2 (two) times daily. 60 tablet 4  . carbidopa-levodopa (SINEMET IR) 25-100 MG tablet Take 1.5 tablets by mouth 4 (four) times daily. At 8, 12, 4 PM, and 8 PM 540 tablet 3  . hydrALAZINE (APRESOLINE) 10 MG tablet Take 1 tablet by mouth up to three times daily for systolic blood pressure readings above 170. 90 tablet 0  . levothyroxine (SYNTHROID, LEVOTHROID) 100 MCG tablet Take 1 tablet my mouth on an empty stomach with a full glass of water. 90 tablet 1  . losartan (COZAAR) 100 MG tablet Take 1 tablet (100 mg total) by mouth daily. 90 tablet 3  . Multiple Vitamin (MULTIVITAMIN WITH MINERALS) TABS Take 1 tablet by mouth daily. Reported on 08/01/2015    . nitroGLYCERIN (NITROSTAT) 0.4 MG SL tablet Place 1 tablet (0.4 mg total) under the tongue every 5 (five) minutes x 3  doses as needed for chest pain. 25 tablet 12  . predniSONE (DELTASONE) 10 MG tablet Take 3 tabs on days 1-3, take 2 tabs on days 4-6, take 1 tab on days 7-9 18 tablet 0  . Probiotic Product (ALIGN) 4 MG CAPS Take 1 capsule by mouth daily.    . rasagiline (AZILECT) 1 MG TABS tablet Take 1 tablet (1 mg total) by mouth daily. 90 tablet 3  . simvastatin (ZOCOR) 40 MG tablet Take 1 tablet (40 mg total) by mouth at bedtime. 90 tablet 1   No current facility-administered medications for this visit.     Past Medical History:  Diagnosis Date  . A-fib ([place])   . Arthritis    "right knee" (08/09/2014)  . Basal cell carcinoma   . Benign prostatic hypertrophy with urinary obstruction     . Chronic anticoagulation 12/04/2016  . Complication of anesthesia    "difficulty intubation, had to use fiberoptic 2006"  . Difficult intubation   . DJD (degenerative joint disease)   . Gout   . Hx of colonic polyps   . Hyperlipidemia   . Hypertension    sees [name]  . Hypothyroidism   . Kidney tumor    "tumor on cyst on kidney"   . 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 Mini stroke ([place])   . Parkinson disease ([place])   . Pneumonia    hx of in 1952  . Primary skin squamous cell carcinoma   . Subdural hematoma ([place]) 2006  . TIA (transient ischemic attack)    pt does not recall this hx "but may have had one today" (08/09/2014)    Blood pressure 118/78, pulse (!) 48, height 6\' 4"  (1.93 m), weight 180 lb (81.6 kg), SpO2 97 %.    Essential hypertension Blood pressure still appropriate and patient reports improvement in dizziness. Will continue current therapy without changes and monitor BP 2-3x/week. Plan to follow up with HTN clinic in 8 weeks, but patient was encouraged to contact clininc if BP drops after meals become more prominent and/or frequent.    Carle Fenech Rodriguez-Guzman PharmD, BCPS, [place] [id number] 07/11/2017 8:19 AM

## 2017-07-11 ENCOUNTER — Encounter: Payer: Self-pay | Admitting: Pharmacist

## 2017-07-11 NOTE — Assessment & Plan Note (Signed)
Blood pressure still appropriate and patient reports improvement in dizziness. Will continue current therapy without changes and monitor BP 2-3x/week. Plan to follow up with HTN clinic in 8 weeks, but patient was encouraged to contact clininc if BP drops after meals become more prominent and/or frequent.

## 2017-07-24 IMAGING — CT CT ABDOMEN WO/W CM
3 of 13 series · 10 of 46 positions shown, 16 images · IV contrast (ISOVUE 300)
Comparison: PET-CT 12/06/2015. Abdomen CT 07/27/2015. Abdomen CT
07/19/2014.

CLINICAL DATA: History of right renal cryoablation for neoplasm
07/10/2012

EXAM:
CT ABDOMEN WITHOUT AND WITH CONTRAST
TECHNIQUE: Multidetector CT imaging of the abdomen was performed following the
standard protocol before and following the bolus administration of
intravenous contrast.
CONTRAST:  1 [id number] IOPAMIDOL (SSHL5L-9ZZ) INJECTION 61%

[Series 3: coronal pre · coronal · non-contrast · 0.53mm/px · 1 of 82 slices shown, 2 images]
[im 41/82  soft-tissue]
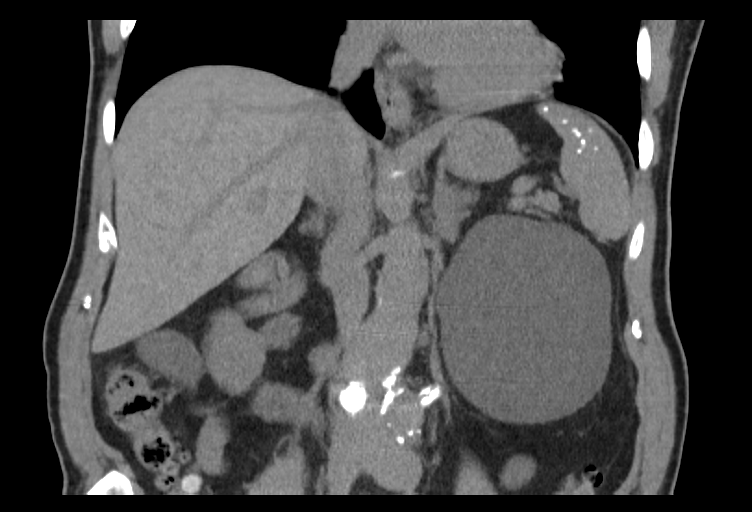
[im 41/82  bone]
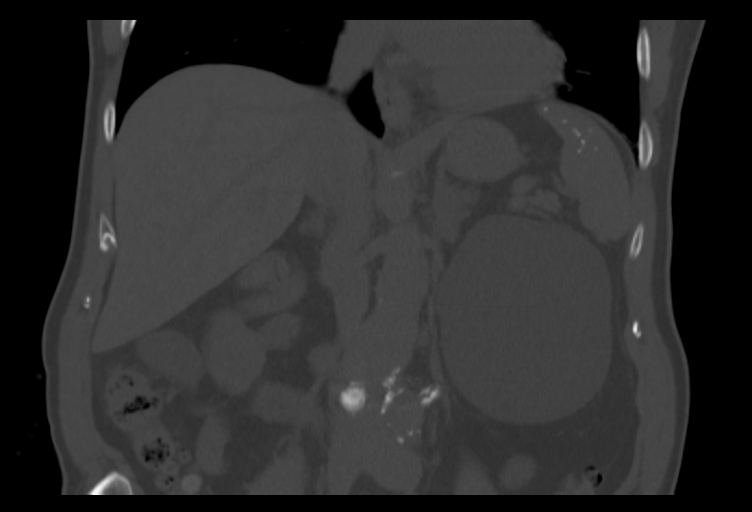

[Series 6: axial arterial · axial · arterial · 0.72mm/px · z∈[-186,-18]mm · 6 of 80 slices shown, 11 images]
[im 12/80  soft-tissue]
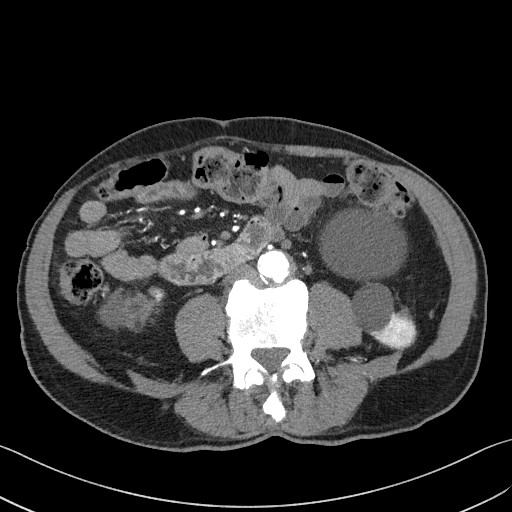
[im 12/80  bone]
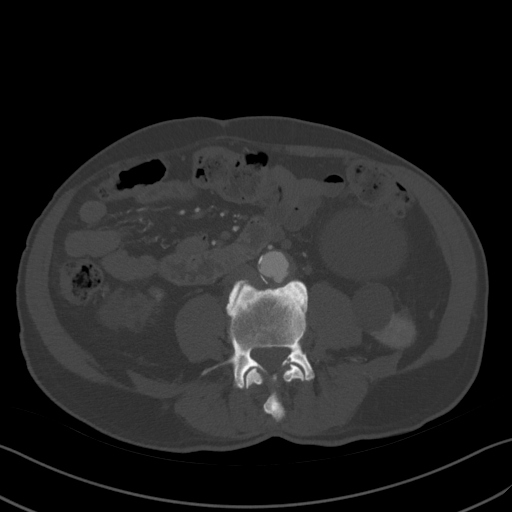
[im 23/80  soft-tissue]
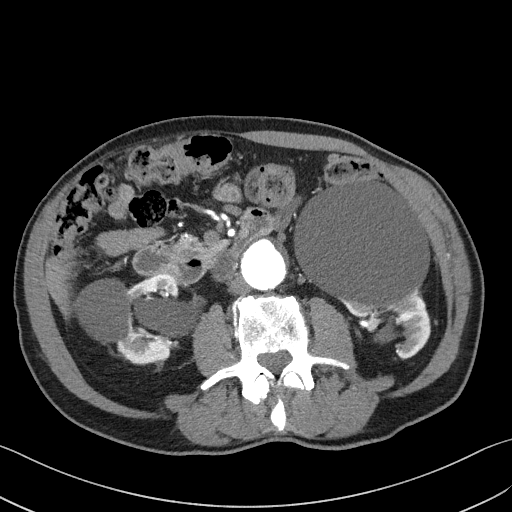
[im 34/80  soft-tissue]
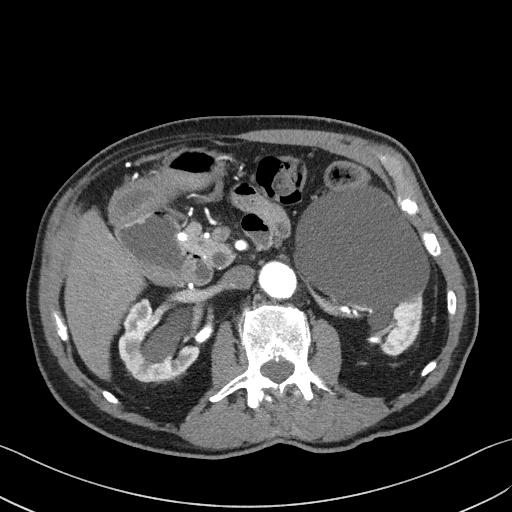
[im 34/80  lung]
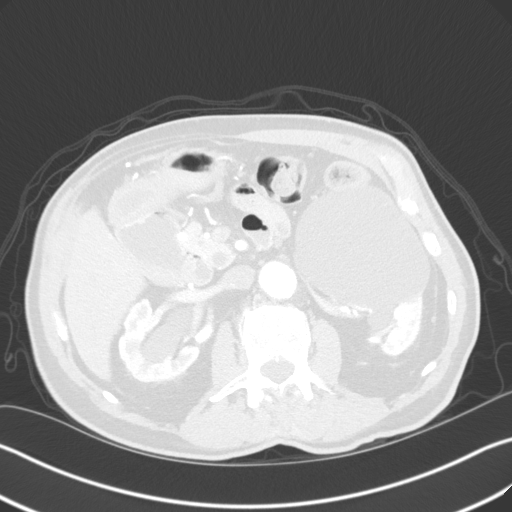
[im 46/80  soft-tissue]
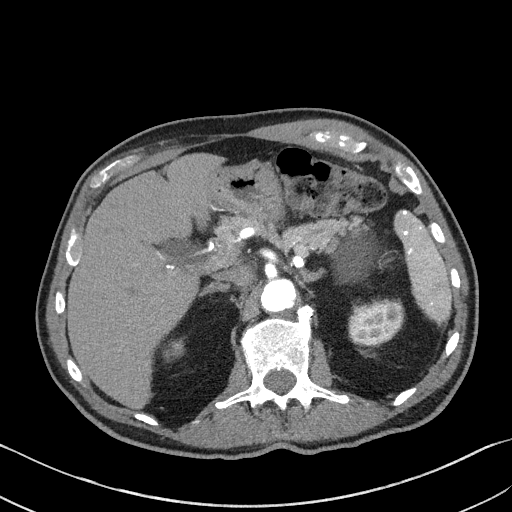
[im 46/80  lung]
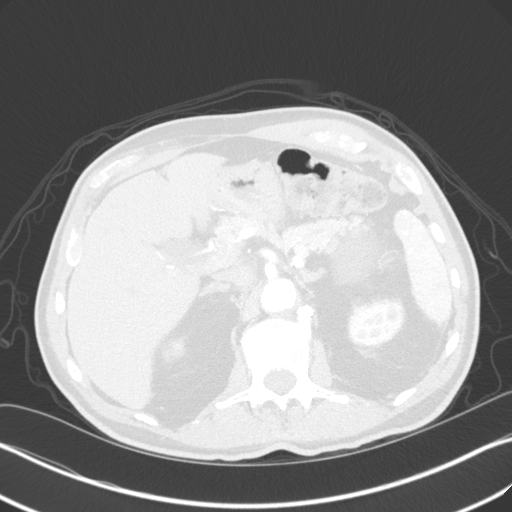
[im 57/80  soft-tissue]
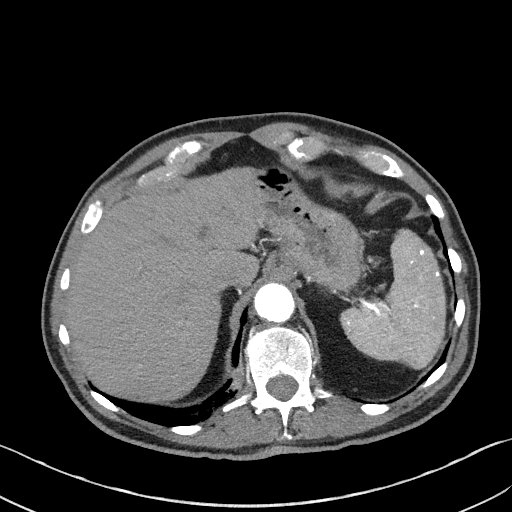
[im 57/80  lung]
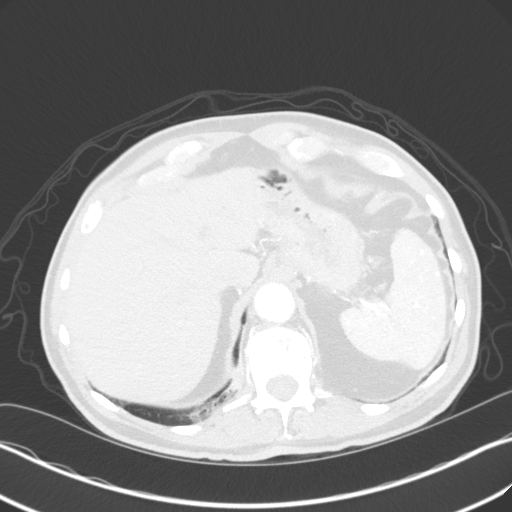
[im 68/80  soft-tissue]
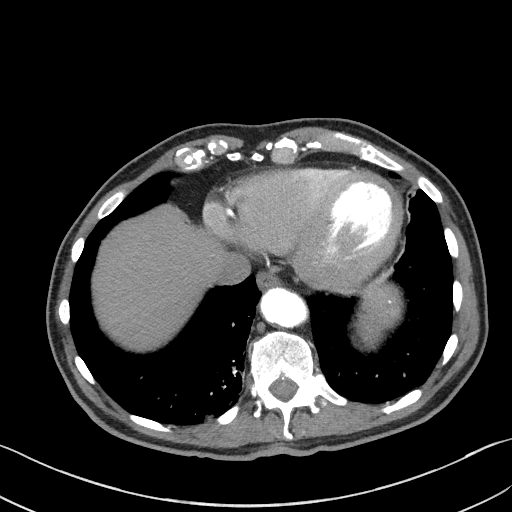
[im 68/80  lung]
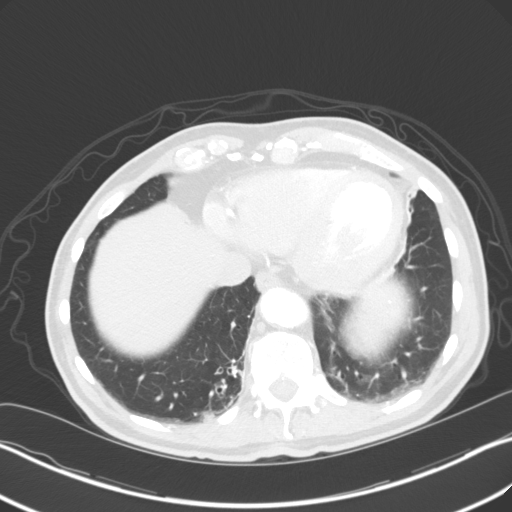

[Series 10: axial nephro · axial · 0.72mm/px · z∈[-186,-120]mm · 3 of 80 slices shown]
[im 12/80  soft-tissue]
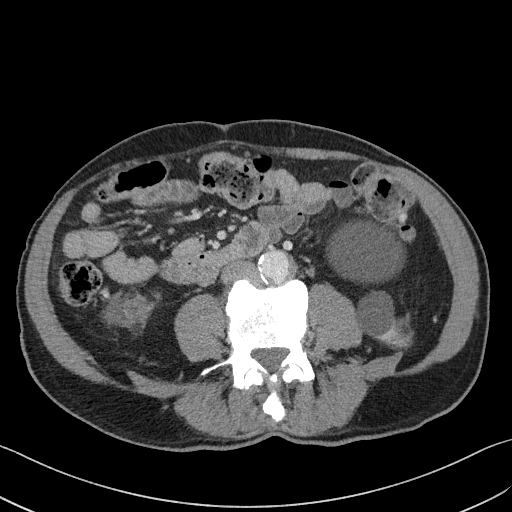
[im 23/80  soft-tissue]
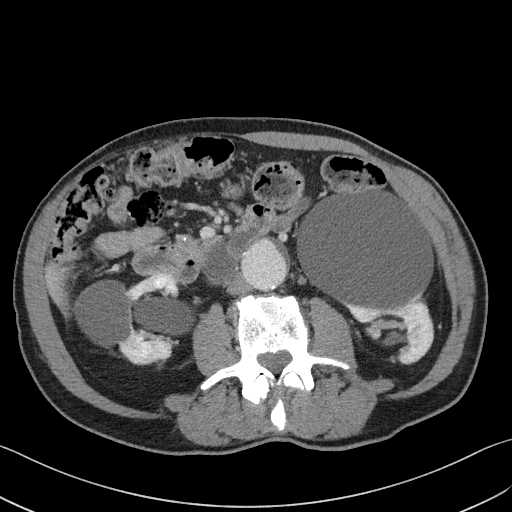
[im 34/80  soft-tissue]
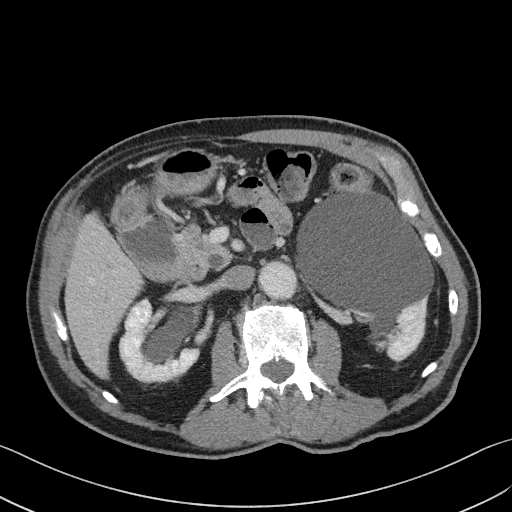

[10 of 46 positions shown; findings below may reference images not displayed]

FINDINGS: Lower chest: The posterior right lower lobe airspace disease seen on
previous PET-CT has nearly resolved in the interval. There are some
scattered small areas of focal airspace consolidation posterior
right lower lobe, in the deep costophrenic sulcus and 1 of these
areas has a somewhat nodular configuration measuring 14 mm.
Associated bronchiectasis and bronchial wall thickening.

Hepatobiliary: 7 mm hypervascular focus in the subcapsular medial
segment left liver (image 17 series 6) unchanged in the interval
since 07/19/2014 exam. 8 mm hypervascular lesion posterior right
liver (image 29 series 6) is stable since 07/19/2014. Another
hypervascular lesion in the posterior right liver (image 25 series
6) measures 7 mm was present but very subtle on 07/19/2014

Stable appearance intra and extrahepatic biliary duct dilatation.
Extrahepatic common bile duct tracks along a duodenal diverticulum
as before.

Pancreas: No focal mass lesion. No dilatation of the main duct. No
intraparenchymal cyst. No peripancreatic edema.

Spleen: Calcified granulomata.

Adrenals/Urinary Tract: No adrenal nodule or mass.

Stable ablation defect extreme inferior pole right kidney. No new or
progressive soft tissue in this region and no unexpected
enhancement. Bilateral renal cysts are stable including a dominant
exophytic 9.9 cm cyst at the interpolar region left kidney. Right
hydronephrosis is stable with dilated right renal pelvis but no
associated hydroureter, compatible with UPJ obstruction. No left
hydroureteronephrosis.

Stomach/Bowel: Stomach is nondistended. No gastric wall thickening.
No evidence of outlet obstruction. Duodenum is normally positioned
as is the ligament of Treitz. Multiple large duodenal diverticuli
are evident. Visualize small bowel loops and colonic segments of the
abdomen are unremarkable except for scattered diverticular disease
in the colon.

Vascular/Lymphatic: Infrarenal abdominal aortic aneurysm not
substantially changed at 3.8 cm maximum diameter today compared to
3.7 cm previously. There is no gastrohepatic or hepatoduodenal
ligament lymphadenopathy. No intraperitoneal or retroperitoneal
lymphadenopathy.

Other: No intraperitoneal free fluid.

Musculoskeletal: Bone windows reveal no worrisome lytic or sclerotic
osseous lesions.
IMPRESSION: 1. Stable exam. No changes in the ablation scar suggests recurrent
disease. No metastatic disease in the abdomen.
2. Several tiny hypervascular foci in the liver parenchyma, stable
and most consistent with benign process such as flash filling
cavernoma or vascular malformation. Attention on follow-up
suggested.
3. No change right hydronephrosis with imaging features consistent
with UPJ obstruction.
4. Stable bilateral renal cysts.
5. Infrarenal abdominal aortic aneurysm. Recommend followup by
ultrasound in 2 years. This recommendation follows ACR consensus
guidelines: White Paper of the ACR Incidental Findings Committee II
on Vascular Findings. [HOSPITAL] 6586; [DATE].
6. Stable intra and extrahepatic biliary duct dilatation.
7. Airspace disease posterior right lower lobe seen on previous
exams has almost completely resolved. There is 1 residual area that
has a 14 mm somewhat nodular component and attention to this area on
follow-up imaging is recommended.

## 2017-07-28 DIAGNOSIS — E785 Hyperlipidemia, unspecified: Secondary | ICD-10-CM | POA: Diagnosis not present

## 2017-07-28 DIAGNOSIS — G20 Parkinson's disease: Secondary | ICD-10-CM | POA: Diagnosis not present

## 2017-07-28 DIAGNOSIS — K5900 Constipation, unspecified: Secondary | ICD-10-CM | POA: Diagnosis not present

## 2017-07-28 DIAGNOSIS — Z85828 Personal history of other malignant neoplasm of skin: Secondary | ICD-10-CM | POA: Diagnosis not present

## 2017-07-28 DIAGNOSIS — Z823 Family history of stroke: Secondary | ICD-10-CM | POA: Diagnosis not present

## 2017-07-28 DIAGNOSIS — I10 Essential (primary) hypertension: Secondary | ICD-10-CM | POA: Diagnosis not present

## 2017-07-28 DIAGNOSIS — E039 Hypothyroidism, unspecified: Secondary | ICD-10-CM | POA: Diagnosis not present

## 2017-07-28 DIAGNOSIS — Z8249 Family history of ischemic heart disease and other diseases of the circulatory system: Secondary | ICD-10-CM | POA: Diagnosis not present

## 2017-07-28 DIAGNOSIS — Z7901 Long term (current) use of anticoagulants: Secondary | ICD-10-CM | POA: Diagnosis not present

## 2017-07-28 DIAGNOSIS — I4891 Unspecified atrial fibrillation: Secondary | ICD-10-CM | POA: Diagnosis not present

## 2017-08-11 DIAGNOSIS — L570 Actinic keratosis: Secondary | ICD-10-CM | POA: Diagnosis not present

## 2017-08-11 DIAGNOSIS — Z85828 Personal history of other malignant neoplasm of skin: Secondary | ICD-10-CM | POA: Diagnosis not present

## 2017-08-11 DIAGNOSIS — L819 Disorder of pigmentation, unspecified: Secondary | ICD-10-CM | POA: Diagnosis not present

## 2017-08-11 DIAGNOSIS — L218 Other seborrheic dermatitis: Secondary | ICD-10-CM | POA: Diagnosis not present

## 2017-08-11 DIAGNOSIS — L578 Other skin changes due to chronic exposure to nonionizing radiation: Secondary | ICD-10-CM | POA: Diagnosis not present

## 2017-08-24 NOTE — Progress Notes (Signed)
Cardiology Office Note   Date:  08/25/2017   ID:  William [name], DOB [birth year], MRN [id number]  PCP:  Pleas Koch, NP  Cardiologist:   Minus Breeding, MD    Chief Complaint  Patient presents with  . Atrial Fibrillation      History of Present Illness: William [name] is a 84 y.o. male who presents for evaluation of atrial fibrillation.  He has had paroxysmal atrial fibrillation. He's had a TIA as well.  He was admitted in August of last year with hypertension and elevated troponin.  Cardiac catheterization demonstrated 99% septal perforator and 50% LAD stenosis.  His ejection fraction was well preserved.  He did have an elevated EDP.  He was managed medically.  We have seen him for med titration and has had some labile blood pressures.  This has been exacerbated by his [name] and dysautonomia.     Since I saw him he says his blood pressures been reasonably well controlled and still a little labile but much better than it was.  He has not needed any hydralazine.  He is trying to remain active and is limited by his Parkinson's.  They will soon be moving to [place].  He hopes to participate in activities there.  The patient denies any new symptoms such as chest discomfort, neck or arm discomfort. There has been no new shortness of breath, PND or orthopnea. There have been no reported palpitations, presyncope or syncope.    Past Medical History:  Diagnosis Date  . A-fib ([place])   . Arthritis    "right knee" (08/09/2014)  . Basal cell carcinoma   . Benign prostatic hypertrophy with urinary obstruction   . Chronic anticoagulation 12/04/2016  . Complication of anesthesia    "difficulty intubation, had to use fiberoptic 2006"  . Difficult intubation   . DJD (degenerative joint disease)   . Gout   . Hx of colonic polyps   . Hyperlipidemia   . Hypertension    sees [name]  . Hypothyroidism   . Kidney tumor    "tumor on cyst on kidney"   .  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 Mini stroke ([place])   . Parkinson disease ([place])   . Pneumonia    hx of in 1952  . Primary skin squamous cell carcinoma   . Subdural hematoma ([place]) 2006  . TIA (transient ischemic attack)    pt does not recall this hx "but may have had one today" (08/09/2014)    Past Surgical History:  Procedure Laterality Date  . APPENDECTOMY    . BACK SURGERY    . CATARACT EXTRACTION W/ INTRAOCULAR LENS  IMPLANT, BILATERAL Bilateral ~ 1998  . CHOLECYSTECTOMY    . CRYOABLATION  07-10-12   "tumor on cyst on my kidney; had an ablation"  . EYE SURGERY    . INGUINAL HERNIA REPAIR Right   . IR GENERIC HISTORICAL  11/09/2015   IR RADIOLOGIST EVAL & MGMT 11/09/2015 [name], MD [facility] INTERV RAD  . IR RADIOLOGIST EVAL & MGMT  08/13/2016  . KIDNEY SURGERY    . KNEE ARTHROSCOPY Right   . LEFT HEART CATH AND CORONARY ANGIOGRAPHY N/A 12/05/2016   Procedure: LEFT HEART CATH AND CORONARY ANGIOGRAPHY;  Surgeon: [name], [name], MD;  Location: [facility];  Service: Cardiovascular;  Laterality: N/A;  . LUMBAR LAMINECTOMY/DECOMPRESSION MICRODISCECTOMY  05/14/2012   Procedure: LUMBAR LAMINECTOMY/DECOMPRESSION MICRODISCECTOMY 1  LEVEL;  Surgeon: [name], MD;  Location: [facility];  Service: Neurosurgery;  Laterality: Left;  Left Lumbar four-five Laminectomy/Diskectomy/Far lateral diskectomy  . SKIN CANCER EXCISION  "several"   "back of my neck; right eye; clavicle; nose"  . SUBDURAL HEMATOMA EVACUATION VIA CRANIOTOMY  2006  . THYROIDECTOMY     medullary carcinoma  . TONSILLECTOMY       Current Outpatient Medications  Medication Sig Dispense Refill  . amLODipine (NORVASC) 2.5 MG tablet Take 1 tablet (2.5 mg total) by mouth daily. To take with amlodipine 5mg  for total of 7.5mg  daily 90 tablet 3  . amLODipine (NORVASC) 5 MG tablet Take 1 tablet (5 mg total) by mouth daily. To take with 2.5 mg to make 7.5 mg daily 90 tablet 3  .  apixaban (ELIQUIS) 2.5 MG TABS tablet Take 1 tablet (2.5 mg total) by mouth 2 (two) times daily. 60 tablet 4  . carbidopa-levodopa (SINEMET IR) 25-100 MG tablet Take 1.5 tablets by mouth 4 (four) times daily. At 8, 12, 4 PM, and 8 PM 540 tablet 3  . hydrALAZINE (APRESOLINE) 10 MG tablet Take 1 tablet by mouth up to three times daily for systolic blood pressure readings above 170. 90 tablet 0  . levothyroxine (SYNTHROID, LEVOTHROID) 100 MCG tablet Take 1 tablet my mouth on an empty stomach with a full glass of water. 90 tablet 1  . losartan (COZAAR) 100 MG tablet Take 1 tablet (100 mg total) by mouth daily. 90 tablet 3  . Multiple Vitamin (MULTIVITAMIN WITH MINERALS) TABS Take 1 tablet by mouth daily. Reported on 08/01/2015    . nitroGLYCERIN (NITROSTAT) 0.4 MG SL tablet Place 1 tablet (0.4 mg total) under the tongue every 5 (five) minutes x 3 doses as needed for chest pain. 25 tablet 12  . Probiotic Product (ALIGN) 4 MG CAPS Take 1 capsule by mouth daily.    . rasagiline (AZILECT) 1 MG TABS tablet Take 1 tablet (1 mg total) by mouth daily. 90 tablet 3  . simvastatin (ZOCOR) 40 MG tablet Take 40 mg by mouth daily.     No current facility-administered medications for this visit.     Allergies:   Patient has no known allergies.    ROS:  Please see the history of present illness.   Otherwise, review of systems are positive for none.   All other systems are reviewed and negative.    PHYSICAL EXAM: VS:  BP (!) 98/52   Pulse 91   Ht 6\' 4"  (1.93 m)   Wt 179 lb (81.2 kg)   SpO2 95%   BMI 21.79 kg/m  , BMI Body mass index is 21.79 kg/m.  GENERAL:  Well appearing for his age NECK:  No jugular venous distention, waveform within normal limits, carotid upstroke brisk and symmetric, no bruits, no thyromegaly LUNGS:  Clear to auscultation bilaterally CHEST:  Unremarkable HEART:  PMI not displaced or sustained,S1 and S2 within normal limits, no S3, no S4, no clicks, no rubs, no murmurs ABD:  Flat,  positive bowel sounds normal in frequency in pitch, no bruits, no rebound, no guarding, no midline pulsatile mass, no hepatomegaly, no splenomegaly EXT:  2 plus pulses throughout, no edema, no cyanosis no clubbing NEURO:  Slight tremor   EKG: None  Recent Labs: 12/03/2016: B Natriuretic Peptide 67.8 12/05/2016: ALT 5; Hemoglobin 13.5; Platelets 142 12/12/2016: BUN 26; Creatinine, Ser 1.50; Potassium 4.7; Sodium 144 12/31/2016: TSH 2.78    Lipid Panel    Component  Value Date/Time   CHOL 116 12/05/2016 0207   TRIG 139 12/05/2016 0207   TRIG 101 03/25/2006 0935   HDL 46 12/05/2016 0207   CHOLHDL 2.5 12/05/2016 0207   VLDL 28 12/05/2016 0207   LDLCALC 42 12/05/2016 0207      Wt Readings from Last 3 Encounters:  08/25/17 179 lb (81.2 kg)  07/10/17 180 lb (81.6 kg)  05/29/17 182 lb (82.6 kg)      Other studies Reviewed: Additional studies/ records that were reviewed today include:    Office records. Review of the above records demonstrates:      ASSESSMENT AND PLAN:  ATRIAL FIB:  The patient has a CHA2DS2 - VASc score of 5 with a risk of stroke of 6.7%. He has had no symptomatic paroxysms of this.  No change in therapy.   THORACIC AORTIC ENLARGEMENT:    This wasa 4.4 cm on CT in Dec.  I will follow this up with repeat CT in the future.   I will follow this with repeat imaging in the future.  He is seeing vascular surgery soon to have follow up of an AAA.   HTN:  The blood pressure is at target. No change in medications is indicated. We will continue with therapeutic lifestyle changes (TLC).  CAD:   The patient has no new sypmtoms.  No further cardiovascular testing is indicated.  We will continue with aggressive risk reduction and meds as listed.  CHRONIC DIASTOLIC HF: He seems to be euvolemic.  No change in therapy.     Current medicines are reviewed at length with the patient today.  The patient does not have concerns regarding medicines.  The following changes have  been made:  None3  Labs/ tests ordered today include: None  No orders of the defined types were placed in this encounter.    Disposition:   FU with me in 6 months.    Signed, Minus Breeding, MD  08/25/2017 11:23 AM    [facility]

## 2017-08-25 ENCOUNTER — Ambulatory Visit: Payer: Medicare HMO | Admitting: Cardiology

## 2017-08-25 ENCOUNTER — Encounter: Payer: Self-pay | Admitting: Cardiology

## 2017-08-25 VITALS — BP 98/52 | HR 91 | Ht 76.0 in | Wt 179.0 lb

## 2017-08-25 DIAGNOSIS — I480 Paroxysmal atrial fibrillation: Secondary | ICD-10-CM

## 2017-08-25 DIAGNOSIS — I2510 Atherosclerotic heart disease of native coronary artery without angina pectoris: Secondary | ICD-10-CM

## 2017-08-25 DIAGNOSIS — I5032 Chronic diastolic (congestive) heart failure: Secondary | ICD-10-CM | POA: Diagnosis not present

## 2017-08-25 DIAGNOSIS — I712 Thoracic aortic aneurysm, without rupture, unspecified: Secondary | ICD-10-CM

## 2017-08-25 DIAGNOSIS — I10 Essential (primary) hypertension: Secondary | ICD-10-CM

## 2017-08-25 NOTE — Patient Instructions (Signed)

## 2017-08-28 ENCOUNTER — Encounter: Payer: Self-pay | Admitting: Family

## 2017-08-28 ENCOUNTER — Ambulatory Visit (HOSPITAL_COMMUNITY)
Admission: RE | Admit: 2017-08-28 | Discharge: 2017-08-28 | Disposition: A | Discharge status: Home / Self Care | Payer: Medicare HMO | Source: Ambulatory Visit | Attending: Family | Admitting: Family

## 2017-08-28 ENCOUNTER — Ambulatory Visit (INDEPENDENT_AMBULATORY_CARE_PROVIDER_SITE_OTHER): Payer: Medicare HMO | Admitting: Family

## 2017-08-28 VITALS — BP 136/79 | HR 42 | Temp 96.6°F | Resp 16 | Ht 76.0 in | Wt 177.0 lb

## 2017-08-28 DIAGNOSIS — I714 Abdominal aortic aneurysm, without rupture, unspecified: Secondary | ICD-10-CM

## 2017-08-28 NOTE — Patient Instructions (Signed)
Abdominal Aortic Aneurysm Blood pumps away from the heart through tubes (blood vessels) called arteries. Aneurysms are weak or damaged places in the wall of an artery. It bulges out like a balloon. An abdominal aortic aneurysm happens in the main artery of the body (aorta). It can burst or tear, causing bleeding inside the body. This is an emergency. It needs treatment right away. What are the causes? The exact cause is unknown. Things that could cause this problem include:  Fat and other substances building up in the lining of a tube.  Swelling of the walls of a blood vessel.  Certain tissue diseases.  Belly (abdominal) trauma.  An infection in the main artery of the body.  What increases the risk? There are things that make it more likely for you to have an aneurysm. These include:  Being over the age of 82 years old.  Having high blood pressure (hypertension).  Being a male.  Being white.  Being very overweight (obese).  Having a family history of aneurysm.  Using tobacco products.  What are the signs or symptoms? Symptoms depend on the size of the aneurysm and how fast it grows. There may not be symptoms. If symptoms occur, they can include:  Pain (belly, side, lower back, or groin).  Feeling full after eating a small amount of food.  Feeling sick to your stomach (nauseous), throwing up (vomiting), or both.  Feeling a lump in your belly that feels like it is beating (pulsating).  Feeling like you will pass out (faint).  How is this treated?  Medicine to control blood pressure and pain.  Imaging tests to see if the aneurysm gets bigger.  Surgery. How is this prevented? To lessen your chance of getting this condition:  Stop smoking. Stop chewing tobacco.  Limit or avoid alcohol.  Keep your blood pressure, blood sugar, and cholesterol within normal limits.  Eat less salt.  Eat foods low in saturated fats and cholesterol. These are found in animal and  whole dairy products.  Eat more fiber. Fiber is found in whole grains, vegetables, and fruits.  Keep a healthy weight.  Stay active and exercise often.  This information is not intended to replace advice given to you by your health care provider. Make sure you discuss any questions you have with your health care provider. Document Released: 07/27/2012 Document Revised: 09/07/2015 Document Reviewed: 05/01/2012 Elsevier Interactive Patient Education  2017 [employer]  

## 2017-08-28 NOTE — Progress Notes (Signed)
[facility] [place]   CC: Follow up Abdominal Aortic Aneurysm  History of Present Illness  William [name] is a 84 y.o. (07/05/1928) male patient of Dr. Trula Slade who has been treated by Dr. 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 His CTA for follow up revealed a 3.9cm AAA. Pt states Dr. 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 Angelina Pih told him).  .  Pt denies any abdominal pain, he has a history of back surgery, no new back pain.   He denies claudication symptoms with walking; he has curtailed his walking as his [name] has made him feel more wobbly.  He smoked for about 10 years, quit in the 1960's.   In April or May or 2016 he was in the ER for atrial fibrillation. His HTN medications have been tweaked for better control. He follows up with Dr. Percival Spanish for this. He also has [name] and he states that he was on a medication for this that could cause hypertension over time. He has discontinued this medication and he states his blood pressure has been under better control, but wife states his blood pressure has a tendency to drop.     He also had a TIA in April or May of 2016 in which he states he was reading an article on the computer and couldn't comprehend what he was reading. When he was trying to speak, he was unable to and 911 was called. By the time the responder arrived, his symptoms had resolved.  Pt and wife deny any subsequent TIA or stroke.   Dr. Percival Spanish, his cardiologist, indicates in his progress note from 08-25-17, that he will request a follow up chest CT to monitor 4.4 cm thoracic ascending aortic dilatation.   He is on a statin for his cholesterol. He is on an ARB for his HTN. He is also on an aspirin. He  does take Eliquis due to his history of Afib, states he has not has any subsequent known atrial fib.  Pt Diabetic: No Pt smoker: former smoker, quit about 1967, started smoking about age 53    Past Medical History:  Diagnosis Date  . A-fib (Newborn)   . Arthritis    "right knee" (08/09/2014)  . Basal cell carcinoma   . Benign prostatic hypertrophy with urinary obstruction   . Chronic anticoagulation 12/04/2016  . Complication of anesthesia    "difficulty intubation, had to use fiberoptic 2006"  . Difficult intubation   . DJD (degenerative joint disease)   . Gout   . Hx of colonic polyps   . Hyperlipidemia   . Hypertension    sees [name]  . Hypothyroidism   . Kidney tumor    "tumor on cyst on kidney"   . 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 Mini stroke ([employer])   . Parkinson disease ([place])   . Pneumonia    hx of in 1952  . Primary skin squamous cell carcinoma   . Subdural hematoma ([place]) 2006  . TIA (transient ischemic attack)    pt does not recall this hx "but may have had one today" (08/09/2014)   Past Surgical History:  Procedure Laterality Date  . APPENDECTOMY    . BACK SURGERY    .  CATARACT EXTRACTION W/ INTRAOCULAR LENS  IMPLANT, BILATERAL Bilateral ~ 1998  . CHOLECYSTECTOMY    . CRYOABLATION  07-10-12   "tumor on cyst on my kidney; had an ablation"  . EYE SURGERY    . INGUINAL HERNIA REPAIR Right   . IR GENERIC HISTORICAL  11/09/2015   IR RADIOLOGIST EVAL & MGMT 11/09/2015 [name], MD [facility] INTERV RAD  . IR RADIOLOGIST EVAL & MGMT  08/13/2016  . KIDNEY SURGERY    . KNEE ARTHROSCOPY Right   . LEFT HEART CATH AND CORONARY ANGIOGRAPHY N/A 12/05/2016   Procedure: LEFT HEART CATH AND CORONARY ANGIOGRAPHY;  Surgeon: [name], [name], MD;  Location: [facility];  Service: Cardiovascular;  Laterality: N/A;  . LUMBAR LAMINECTOMY/DECOMPRESSION MICRODISCECTOMY  05/14/2012   Procedure: LUMBAR LAMINECTOMY/DECOMPRESSION  MICRODISCECTOMY 1 LEVEL;  Surgeon: [name], MD;  Location: [facility];  Service: Neurosurgery;  Laterality: Left;  Left Lumbar four-five Laminectomy/Diskectomy/Far lateral diskectomy  . SKIN CANCER EXCISION  "several"   "back of my neck; right eye; clavicle; nose"  . SUBDURAL HEMATOMA EVACUATION VIA CRANIOTOMY  2006  . THYROIDECTOMY     medullary carcinoma  . TONSILLECTOMY     Social History Social History   Socioeconomic History  . Marital status: Widowed    Spouse name: Not on file  . Number of children: 3  . Years of education: 52  . Highest education level: Not on file  Occupational History  . Occupation: retired  Scientific laboratory technician  . Financial resource strain: Not on file  . Food insecurity:    Worry: Not on file    Inability: Not on file  . Transportation needs:    Medical: Not on file    Non-medical: Not on file  Tobacco Use  . Smoking status: Former Smoker    Packs/day: 0.80    Years: 15.00    Pack years: 12.00    Types: Cigarettes    Last attempt to quit: 04/15/1965    Years since quitting: 52.4  . Smokeless tobacco: Never Used  Substance and Sexual Activity  . Alcohol use: Yes    Alcohol/week: 3.0 oz    Types: 5 Glasses of wine per week    Comment: 1 glass of wine every night (6oz glass)  . Drug use: No  . Sexual activity: Yes  Lifestyle  . Physical activity:    Days per week: Not on file    Minutes per session: Not on file  . Stress: Not on file  Relationships  . Social connections:    Talks on phone: Not on file    Gets together: Not on file    Attends religious service: Not on file    Active member of club or organization: Not on file    Attends meetings of clubs or organizations: Not on file    Relationship status: Not on file  . Intimate partner violence:    Fear of current or ex partner: Not on file    Emotionally abused: Not on file    Physically abused: Not on file    Forced sexual activity: Not on file  Other Topics Concern  . Not on  file  Social History Narrative   Married.   Lives at home with significant other.   Retried. 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   Enjoys golfing, yard work, spending time with family.     Family History Family History  Problem Relation Age of Onset  . Stroke Mother   .  Stroke Father   . Heart disease Father   . Alzheimer's disease Brother   . Alzheimer's disease Sister     Current Outpatient Medications on File Prior to Visit  Medication Sig Dispense Refill  . amLODipine (NORVASC) 2.5 MG tablet Take 1 tablet (2.5 mg total) by mouth daily. To take with amlodipine 5mg  for total of 7.5mg  daily 90 tablet 3  . amLODipine (NORVASC) 5 MG tablet Take 1 tablet (5 mg total) by mouth daily. To take with 2.5 mg to make 7.5 mg daily 90 tablet 3  . apixaban (ELIQUIS) 2.5 MG TABS tablet Take 1 tablet (2.5 mg total) by mouth 2 (two) times daily. 60 tablet 4  . carbidopa-levodopa (SINEMET IR) 25-100 MG tablet Take 1.5 tablets by mouth 4 (four) times daily. At 8, 12, 4 PM, and 8 PM 540 tablet 3  . hydrALAZINE (APRESOLINE) 10 MG tablet Take 1 tablet by mouth up to three times daily for systolic blood pressure readings above 170. 90 tablet 0  . levothyroxine (SYNTHROID, LEVOTHROID) 100 MCG tablet Take 1 tablet my mouth on an empty stomach with a full glass of water. 90 tablet 1  . losartan (COZAAR) 100 MG tablet Take 1 tablet (100 mg total) by mouth daily. 90 tablet 3  . Multiple Vitamin (MULTIVITAMIN WITH MINERALS) TABS Take 1 tablet by mouth daily. Reported on 08/01/2015    . nitroGLYCERIN (NITROSTAT) 0.4 MG SL tablet Place 1 tablet (0.4 mg total) under the tongue every 5 (five) minutes x 3 doses as needed for chest pain. 25 tablet 12  . Probiotic Product (ALIGN) 4 MG CAPS Take 1 capsule by mouth daily.    . rasagiline (AZILECT) 1 MG TABS tablet Take 1 tablet (1 mg total) by mouth daily. 90 tablet 3  . simvastatin (ZOCOR) 40 MG tablet Take 40 mg by mouth daily.     No current facility-administered  medications on file prior to visit.    No Known Allergies  ROS: See HPI for pertinent positives and negatives.  Physical Examination  Vitals:   08/28/17 0934  BP: 136/79  Pulse: (!) 42  Resp: 16  Temp: (!) 96.6 F (35.9 C)  TempSrc: Oral  SpO2: 97%  Weight: 177 lb (80.3 kg)  Height: 6\' 4"  (1.93 m)   Body mass index is 21.55 kg/m.  General: WDWN male in NAD  Gait: Normal  HEENT: WNL, normocephalic  Pulmonary: normal non-labored breathing, CTAB Cardiac: RRR, no detected murmurs, rubs or gallops; no carotid bruits  Abdomen: soft, NT, no palpable masses; well healed cholecystectomy scar.  Skin: without rashes, without ulcers   Vascular Exam/Pulses:   Right  Left   Radial  2+ palpable 2+ palpable  Femoral  3+ palpable 3+ palpable  Popliteal  3+ palpable 3+ palpable   DP  Not  palpable not palpable  PT  Unable to palpate  Unable to palpate    Extremities: without ischemic changes, without Gangrene , without cellulitis; without open wounds; brisk capillary refill in all toes.  Musculoskeletal: no muscle wasting or atrophy. Enlarged and prominent base of right great toe metatarsal joint (pt reports site of gout exacerbations).  Skin: No rashes, no ulcers, no cellulitis.   Neurologic: CN 2-12 intact, Pain and light touch intact in extremities are intact, Motor exam as listed above. Moderate tremor of right hand.  Psychiatric: Normal thought content, mood appropriate to clinical situation.    DATA  AAA Duplex (08/28/2017):  Previous size: 3.8 cm by CT (Date: 08-08-16)  Current size:  3.8 cm (Date: 08/28/17); Right CIA: 1.3 cm; Left CIA: 1.4 cm  08-08-16 CTA abd/pelvis requested by Dr. Kathlene Cote: Infrarenal abdominal aortic aneurysm not substantially changed at 3.8 cm maximum diameter today compared to 3.7 cm previously.    04-04-17 chest CT, follow up pulmonary nodule, requested by Dr. Lenna Gilford IMPRESSION: 1. Interval decrease in size of  subpleural consolidation within the medial right lower lobe most suggestive of improving/resolving infectious/inflammatory process. 2. Ascending thoracic aorta is dilated measuring 4.4 cm. Ascending thoracic aortic aneurysm. Recommend semi-annual imaging followup by CTA or MRA and referral to cardiothoracic surgery if not already obtained. This recommendation follows 2010 ACCF/AHA/AATS/ACR/ASA/SCA/SCAI/SIR/STS/SVM Guidelines for the Diagnosis and Management of Patients With Thoracic Aortic Disease. Circulation. 2010; 121: G017-C944   09-26-15 bilateral popliteal artery duplex: No popliteal artery aneurysms.  June 2017 ABI's Normal bilateral ABI's with all triphasic wavefroms    Medical Decision Making  The patient is a 84 y.o. male with an asymptomatic small AAA with no increase in size, 3.8 cm today by duplex, 3.8 cm by CT  On 08-08-16.   Pt states Dr. 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 Angelina Pih told him).    He has prominent bilateral popliteal pulses, denies claudication symptoms with walking.  Bilateral popliteal artery Duplex on 09-26-15 indicates no focal popliteal aneurysm visualized bilaterally.   Dr. Percival Spanish is monitoring 4.4 cm thoracic ascending aortic dilatation with chest CT.    PLAN:   Based on this patient's exam and diagnostic studies, the patient will follow up in 2 years  with the following studies: AAA duplex.       Consideration for repair of AAA would be made when the size is 5.0 cm, growth > 1 cm/yr, and symptomatic status.         The patient was given information about AAA including signs, symptoms, treatment, and how to minimize the risk of enlargement and rupture of aneurysms.    I emphasized the importance of maximal medical management including strict control of  blood pressure, blood glucose, and lipid levels, antiplatelet agents, obtaining regular exercise, and continued  cessation of smoking.   The patient was advised to call 911 should the patient experience sudden onset abdominal or back pain.   Thank you for allowing [place] to participate in this patient's care.  Clemon Chambers, RN, MSN, FNP-C [facility] [place] Office: [facility] Physician: Oneida Alar  08/28/2017, 9:52 AM

## 2017-09-01 ENCOUNTER — Other Ambulatory Visit (HOSPITAL_COMMUNITY): Payer: Medicare HMO

## 2017-09-01 ENCOUNTER — Ambulatory Visit: Payer: Medicare HMO | Admitting: Family

## 2017-09-02 ENCOUNTER — Ambulatory Visit: Payer: Medicare HMO | Admitting: Neurology

## 2017-09-02 ENCOUNTER — Encounter: Payer: Self-pay | Admitting: Neurology

## 2017-09-02 VITALS — BP 95/60 | HR 48 | Ht 76.0 in | Wt 179.0 lb

## 2017-09-02 DIAGNOSIS — I480 Paroxysmal atrial fibrillation: Secondary | ICD-10-CM

## 2017-09-02 DIAGNOSIS — G20 Parkinson's disease: Secondary | ICD-10-CM | POA: Diagnosis not present

## 2017-09-02 DIAGNOSIS — I952 Hypotension due to drugs: Secondary | ICD-10-CM | POA: Diagnosis not present

## 2017-09-02 NOTE — Patient Instructions (Addendum)
We should keep your medication for PD the same. Increasing your levodopa in future should be done with real caution due to side effect concerns: low blood pressure, lightheadedness, involuntary movements.  Please drink more water.

## 2017-09-02 NOTE — Progress Notes (Signed)
Subjective:    Patient ID: William [name] is a 84 y.o. male.  HPI     Interim history:   Mr. Demetriou is an 84 year old right-handed gentleman with an underlying medical history of right TIA in January 2003, SDH (s/p left craniotomy in August 2006), hypertension, hypothyroidism, thyroid cancer, partial onset seizures (off Dilantin), lumbar spine disease, status post lower back surgery at L4-5 in January 2014, renal tumor, status post kidney tumor ablative surgery in March 2014, who presents for followup consultation of his right-sided predominant [name]. He is accompanied by [name] (longterm GF) again today. I last saw him on 05/05/2017, at which time he reported doing quite well, no recent falls or recent illness. They were planning to move into a retirement community by the end of this year. He was trying to exercise regularly. Blood pressure was stable. He had seen pulmonology for a lung nodule which was stable. I suggested we continue with his current medication regimen.  Today, 09/02/2017 (all dictated new, as well as above notes, some dictation done in note pad or Word, outside of chart, may appear as copied):   He reports doing well, they have recently consolidated their household and are planning to move in the first part of next year. He has felt stable, he admits that he does not always drink enough water, has not been as active as far as exercise goes. He had a recent checkup with his cardiologist, blood pressure was noted to be low at the time as well. He does not feel lightheaded today. He only had 1 cup of water so far today however.   The patient's allergies, current medications, family history, past medical history, past social history, past surgical history and problem list were reviewed and updated as appropriate.    Previously (copied from previous notes for reference):    I saw him on 12/31/2016, at which time he reported intermittent lightheadedness. He had  some blood pressure fluctuations with really high values at times. He was hospitalized in August 2018 for a non-STEMI. He had significant orthostatic hypotension during the office visit. He was advised to talk to his providers about reducing his blood pressure medication. He was advised to not continue with the boxing classes for [name] but exercise within his limitations at home. He was advised to try melatonin at night for sleep issues.     I saw him on 06/27/2016, at which time he reported doing well, he had a trip to [place] in [place] planned for springtime. He had no recent falls. He was supposed to be on a smaller dose of Eliquis, namely 2.5 mg twice a day. He continued to participate in the boxing class for [name] and felt that they were helpful.   I saw him on 03/05/2016, at which time he reported doing okay. He was during his stay active, was participating in [employer] steady boxing, which he felt was helpful. He was avoiding strenuous parts of the exercise. He was interested in participating in a new drug study at [facility]. This was to investigate apomorphine sublingual to decrease off time. He was on Azilect once daily. He was taking Sinemet 4 times a day. He was trying to hydrate well, reported no recent falls. I suggested he increase the Sinemet to 1-1/2 pills 4 times a day. I suggested he continue with generic Azilect once daily.   I saw him on 10/26/2015, at which time he reported doing quite well, had some stumbling episodes, no  actual falls, was trying to drink enough water and was still physically active. He had signed up for rock steady boxing classes but [name] noted that he would get too exhausted.   I saw him on 06/22/2015, at which time he reported doing fairly well. He had finished outpatient OT, PT, and ST. Unfortunately, he did take a fall in the garage couple of months prior as he was coming in from the graduate, stepped backwards on the stairs and slipped  off falling backwards, landed on his behind, head struck the car, he denied loss of consciousness or headache or bruise and no sequelae were reported. He was having rails installed in the garage entrance. Was still trying to stay active, playing golf. We mutually agreed to continue with Sinemet 4 times a day and Azilect once daily. We talked about fall risk and gait safety and fall precautions.   I saw him on 02/23/2015, at which time he reported doing fairly well. He was playing golf regularly. His right knee was bothering him. He had no recent falls. He was trying to hydrate well enough. He had no new issues with A. fib and no new complaints otherwise. I noted right knee swelling. He was advised to follow-up with his primary care physician for this. We mutually agreed to keep his Parkinson's medications the same.    I saw him on 11/01/2014 at which time he reported a recent diagnosis with A. fib. He was admitted to the hospital in April. I reviewed the hospital records from 07/17/2014 through 07/18/2014. He was started on Xarelto. He was then re-admitted on 08/09/14 to 08/10/14 due to altered mental status and slurring of speech and was suspected to have a TIA. He was seen by neuro in consultation and a baby aspirin was added to Xarelto. He presented back to the ER on 08/18/14. He a [place] wo contrast on  08/18/2014 , which was negative for any acute changes. He was seen by the Neurologist in the emergency room and it was felt that a full TIA workup was not necessary at the time. He had presented with hypertension and some slurring of speech. He was switched from Xarelto to Eliquis, because the Xarelto is not on formulary at the [place].  He endorsed recent stressors, including the recent passing of his younger brother a week prior with advanced Alzheimer's disease.    He had an MRI brain and MRA head on 08/09/14: MRI HEAD: No acute intracranial process, specifically no acute ischemia. Involutional changes. Mild white  matter changes suggest chronic small vessel ischemic disease. MRA HEAD: No acute large vessel occlusion or high-grade stenosis. Mid grade stenosis RIGHT P2/3 segment.    I suggested we continue with Sinemet at 4 times a day. He was advised to continue with Azilect as well. He was advised to drink more water. In the interim, we restarted outpatient physical therapy, occupational therapy and speech therapy.   I saw him on 04/29/2014, at which time he reported doing well overall but he was more fatigued and had some excessive daytime somnolence. Memory was stable. His mood was stable. He noted some blood pressure fluctuations. Sometimes he had a dull headache and sometimes he felt lightheaded. He had some anxiety over his lady friend's health, as she had fallen and broken rib. He continued to walk regularly and played golf. He had no recent falls. I asked him to continue with his medications, Azilect once daily and Sinemet 4 times a day.   I saw  him on 10/28/2013, at which time he reported doing well. In particular, had no cognitive complaints, no mood issues, and continued to play golf 3 times a week. He was driving well. I suggested an increase in his levodopa to 1 pill 4 times a day of the 25-100 milligrams strength. [facility] and had seen a [place] neurologist as well.      I saw him on 12/07/2012, at which time I felt that he was doing well on Azilect and levodopa. He called in December 2014 with problems with blood pressure fluctuations. He was wondering if this came from the [place] but I did not think it was due to the Azilect per se. I was reluctant to take him off of it.    I first met him on 06/03/2012 and he previously followed by Dr. Morene Antu. He has had primarily right-sided symptoms with regards to his Parkinson's, diagnosed in 2010 with symptoms dating back to late 2009 or early 2010. He had briefly tried Mirapex but was taken off d/t hypotension. L spine  MRI in June 2013 showed renal cysts, degenerative joint disease most prominent at L4-5. He tried acupuncture. His MRI of the lumbar spine showed abnormalities with his kidney with a cyst and a tumor and he had ablative surgery on 07/10/12 and had a FU CT done. He had lower back surgery on 05/14/12.  I saw him back on 09/03/2012 and I suggested starting Azilect. He has been tolerating it well and both he and his girlfriend felt that he did better with it in terms of dexterity and fine motor control. He had some hypotension and lightheadedness and reduced his BP medication. He has been having some issue with gout.      His Past Medical History Is Significant For: Past Medical History:  Diagnosis Date  . A-fib ([place])   . Arthritis    "right knee" (08/09/2014)  . Basal cell carcinoma   . Benign prostatic hypertrophy with urinary obstruction   . Chronic anticoagulation 12/04/2016  . Complication of anesthesia    "difficulty intubation, had to use fiberoptic 2006"  . Difficult intubation   . DJD (degenerative joint disease)   . Gout   . Hx of colonic polyps   . Hyperlipidemia   . Hypertension    sees [name]  . Hypothyroidism   . Kidney tumor    "tumor on cyst on kidney"   . 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 Mini stroke ([place])   . Parkinson disease ([place])   . Pneumonia    hx of in 1952  . Primary skin squamous cell carcinoma   . Subdural hematoma ([place]) 2006  . TIA (transient ischemic attack)    pt does not recall this hx "but may have had one today" (08/09/2014)    His Past Surgical History Is Significant For: Past Surgical History:  Procedure Laterality Date  . APPENDECTOMY    . BACK SURGERY    . CATARACT EXTRACTION W/ INTRAOCULAR LENS  IMPLANT, BILATERAL Bilateral ~ 1998  . CHOLECYSTECTOMY    . CRYOABLATION  07-10-12   "tumor on cyst on my kidney; had an ablation"  . EYE SURGERY    . INGUINAL HERNIA REPAIR Right   . IR GENERIC HISTORICAL   11/09/2015   IR RADIOLOGIST EVAL & MGMT 11/09/2015 [name], MD [facility] INTERV RAD  . IR RADIOLOGIST EVAL & MGMT  08/13/2016  .  KIDNEY SURGERY    . KNEE ARTHROSCOPY Right   . LEFT HEART CATH AND CORONARY ANGIOGRAPHY N/A 12/05/2016   Procedure: LEFT HEART CATH AND CORONARY ANGIOGRAPHY;  Surgeon: [name], [name], MD;  Location: [facility];  Service: Cardiovascular;  Laterality: N/A;  . LUMBAR LAMINECTOMY/DECOMPRESSION MICRODISCECTOMY  05/14/2012   Procedure: LUMBAR LAMINECTOMY/DECOMPRESSION MICRODISCECTOMY 1 LEVEL;  Surgeon: [name], MD;  Location: [facility];  Service: Neurosurgery;  Laterality: Left;  Left Lumbar four-five Laminectomy/Diskectomy/Far lateral diskectomy  . SKIN CANCER EXCISION  "several"   "back of my neck; right eye; clavicle; nose"  . SUBDURAL HEMATOMA EVACUATION VIA CRANIOTOMY  2006  . THYROIDECTOMY     medullary carcinoma  . TONSILLECTOMY      His Family History Is Significant For: Family History  Problem Relation Age of Onset  . Stroke Mother   . Stroke Father   . Heart disease Father   . Alzheimer's disease Brother   . Alzheimer's disease Sister     His Social History Is Significant For: Social History   Socioeconomic History  . Marital status: Widowed    Spouse name: Not on file  . Number of children: 3  . Years of education: 35  . Highest education level: Not on file  Occupational History  . Occupation: retired  Scientific laboratory technician  . Financial resource strain: Not on file  . Food insecurity:    Worry: Not on file    Inability: Not on file  . Transportation needs:    Medical: Not on file    Non-medical: Not on file  Tobacco Use  . Smoking status: Former Smoker    Packs/day: 0.80    Years: 15.00    Pack years: 12.00    Types: Cigarettes    Last attempt to quit: 04/15/1965    Years since quitting: 52.4  . Smokeless tobacco: Never Used  Substance and Sexual Activity  . Alcohol use: Yes    Alcohol/week: 3.0 oz    Types: 5 Glasses of  wine per week    Comment: 1 glass of wine every night (6oz glass)  . Drug use: No  . Sexual activity: Yes  Lifestyle  . Physical activity:    Days per week: Not on file    Minutes per session: Not on file  . Stress: Not on file  Relationships  . Social connections:    Talks on phone: Not on file    Gets together: Not on file    Attends religious service: Not on file    Active member of club or organization: Not on file    Attends meetings of clubs or organizations: Not on file    Relationship status: Not on file  Other Topics Concern  . Not on file  Social History Narrative   Married.   Lives at home with significant other.   Retried. 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   Enjoys golfing, yard work, spending time with family.      His Allergies Are:  No Known Allergies:   His Current Medications Are:  Outpatient Encounter Medications as of 09/02/2017  Medication Sig  . amLODipine (NORVASC) 2.5 MG tablet Take 1 tablet (2.5 mg total) by mouth daily. To take with amlodipine 47m for total of 7.556mdaily  . amLODipine (NORVASC) 5 MG tablet Take 1 tablet (5 mg total) by mouth daily. To take with 2.5 mg to make 7.5 mg daily  . apixaban (ELIQUIS) 2.5 MG TABS tablet Take  1 tablet (2.5 mg total) by mouth 2 (two) times daily.  . carbidopa-levodopa (SINEMET IR) 25-100 MG tablet Take 1.5 tablets by mouth 4 (four) times daily. At 8, 12, 4 PM, and 8 PM  . hydrALAZINE (APRESOLINE) 10 MG tablet Take 1 tablet by mouth up to three times daily for systolic blood pressure readings above 170.  . levothyroxine (SYNTHROID, LEVOTHROID) 100 MCG tablet Take 1 tablet my mouth on an empty stomach with a full glass of water.  Marland Kitchen losartan (COZAAR) 100 MG tablet Take 1 tablet (100 mg total) by mouth daily.  . Multiple Vitamin (MULTIVITAMIN WITH MINERALS) TABS Take 1 tablet by mouth daily. Reported on 08/01/2015  . nitroGLYCERIN (NITROSTAT) 0.4 MG SL tablet Place 1 tablet (0.4 mg total) under the tongue every 5  (five) minutes x 3 doses as needed for chest pain.  . Probiotic Product (ALIGN) 4 MG CAPS Take 1 capsule by mouth daily.  . rasagiline (AZILECT) 1 MG TABS tablet Take 1 tablet (1 mg total) by mouth daily.  . simvastatin (ZOCOR) 40 MG tablet Take 40 mg by mouth daily.   No facility-administered encounter medications on file as of 09/02/2017.   :  Review of Systems:  Out of a complete 14 point review of systems, all are reviewed and negative with the exception of these symptoms as listed below: Review of Systems  Neurological:       Pt presents today to discuss his PD. Pt denies any recent falls.    Objective:  Neurological Exam  Physical Exam Physical Examination:   Vitals:   09/02/17 1024  BP: 95/60  Pulse: (!) 48   General Examination: The patient is a very pleasant 84 y.o. male in no acute distress. He appears well-developed and well-nourished and well groomed. Good spirits, denies lightheadedness. Does admit that he only had 1 cup of water so far today.  HEENT:Normocephalic, atraumatic, pupils are equal, round and reactive to light and accommodation. Extraocular trackingshows mild saccadic breakdown. Speech is moderately hypophonic and mildly to moderately dysarthric. Hearing is grossly intact. Oropharynx exam revealsmoderate mouth dryness, tongue protrudes centrally and palate elevates symmetrically. No other abnormal findings, he has moderate to severe nuchal rigidity and decrease in passive range of motion.  Chest:Clear to auscultation without wheezing, rhonchi or crackles noted.  Heart:S1+S2+0, regular and normal without murmurs, rubs or gallops noted.   Abdomen:Soft, non-tender and non-distended with normal bowel sounds appreciated on auscultation.  Extremities:There is nopitting edema in the distal lower extremities bilaterally.   Skin: Warm and dry without trophic changes noted. Mild bruising on hands, stable, chronic.  Musculoskeletal: exam reveals no  obvious joint deformities, tenderness or joint swelling or erythema.   Neurologically:  Mental status: The patient is awake, alert and oriented in all 4 spheres. Hisimmediate and remote memory, attention, language skills and fund of knowledge are appropriate. There is no evidence of aphasia, agnosia, apraxia or anomia. Speech is clear with normal prosody and enunciation. Thought process is linear. Mood is normaland affect is normal.  Cranial nerves II - XII are as described above under HEENT exam.  Motor exam: Normal bulk, and strength for age. He has increased tone in the right more than left upper and lower extremities. He has an intermittent resting tremor in both upper extremities, right more than left. Fine motor skills are moderately impaired on the right andslightly better on theleft. He has mild intermittent dyskinesias, mostly on the right side and some cocontraction. Reflexes are 1+ throughout. He stands  up with mild difficulty and pushes himself up. Posture is good today. He walks with decreased stride length and decrease pace, slightly decreased arm swing bilaterally, right more than left. Sensory exam is intact to light touch. Cerebellar testing shows no additional concern for dysmetria or intention tremor or gait ataxia.  Assessment and Plan:   In summary, Ariel Wingrove Broughtonis a very pleasant 84 year old malewith an underlying medical history of right TIA in January 2003, SDH (s/p left craniotomy in August 2006), hypertension, hypothyroidism, thyroid cancer, partial onset seizures (off Dilantin), lumbar spine disease, status-post lower back surgery at L4-5 in January 2014, renal tumor, status post kidney tumor ablative surgery in March 2014, who presents for followup consultation of his advanced right-sided predominant [name], complicated by a hypertension, history of heart attack, overall challenges secondary to frailty with time, advancing age and likely some degree of  autonomic dysregulation, fall risk without recent falls thankfully. He was diagnosed with [name] in 2010, symptoms date back to late 2009. He has remained fairly stable over the years but had some progression with time, as is expected. He benefited from an increase in his Sinemet, which is currently 1-1/2 pills 4 times a day and he also is on generic Azilect once daily.we mutually agreed to keep his regimen the same. Any further increase of levodopa therapy should be done with caution as he has low blood pressure and also has developed intermittent dyskinesias. He did not need a refill on his prescription for Sinemet and Azilect. I suggested a 4 month follow-up, sooner if needed. He is advised to stay better hydrated with water and continue with regular exercise.

## 2017-09-04 ENCOUNTER — Ambulatory Visit (INDEPENDENT_AMBULATORY_CARE_PROVIDER_SITE_OTHER): Payer: Medicare HMO | Admitting: Pharmacist Clinician (PhC)/ Clinical Pharmacy Specialist

## 2017-09-04 DIAGNOSIS — I10 Essential (primary) hypertension: Secondary | ICD-10-CM | POA: Diagnosis not present

## 2017-09-04 NOTE — Assessment & Plan Note (Signed)
Patient with hypertension and orthostatic hypotension, has been mostly stable for past few months.  Does still have noticeable hypotensive episode once or twice a week, usually about 1-2 hours after breakfast.  Will resolve with cold water and sitting.    Will not make any changes to medications at this time.  Patient should continue with current regimen, and home BP checks no more than 3-4 times per week.  Should he become symptomatically hypotensive or find a need for regular hydralazine use, they can call for advice.

## 2017-09-04 NOTE — Patient Instructions (Signed)
Call Davi Kroon/Raquel if you have any worries or concerns:  [phone]  Your blood pressure today is 102/60  Check your blood pressure at home 3-4 times per week and keep record of the readings.  Take your BP meds as follows:  Continue with all your current medications  Bring all of your meds, your BP cuff and your record of home blood pressures to your next appointment.  Exercise as you're able, try to walk approximately 30 minutes per day.  Keep salt intake to a minimum, especially watch canned and prepared boxed foods.  Eat more fresh fruits and vegetables and fewer canned items.  Avoid eating in fast food restaurants.    HOW TO TAKE YOUR BLOOD PRESSURE: . Rest 5 minutes before taking your blood pressure. .  Don't smoke or drink caffeinated beverages for at least 30 minutes before. . Take your blood pressure before (not after) you eat. . Sit comfortably with your back supported and both feet on the floor (don't cross your legs). . Elevate your arm to heart level on a table or a desk. . Use the proper sized cuff. It should fit smoothly and snugly around your bare upper arm. There should be enough room to slip a fingertip under the cuff. The bottom edge of the cuff should be 1 inch above the crease of the elbow. . Ideally, take 3 measurements at one sitting and record the average.

## 2017-09-04 NOTE — Progress Notes (Signed)
HPI:  William [name] is a 84 y.o. male patient of Dr Percival Spanish, with a PMH below who presents today for hypertension clinic follow up.   His medical history is significant for PAF (on Eliquis), chronic renal insufficiency (stage 3), RBBB, labile hypertension, and [name].  He was admitted to [facility] on Aug 21 with a NSTEMI and discharged 2 days later.   Since his last visit we changed his amodipine 5mg  every late morning and 2.5mg  every evening. Reports feeling better and greatly improved. Decreased dizziness and no falls since last visit . Also reports not taking much PRN hydralazine anymore. Noted some drops in BP right after breakfast but nothing under 790'W systolic.   Gets hypotensive about twice weekly, usually after breakfast, will sit, drink water then feels fine after 10 + minutes.  He reports no changes in medications and has not had a pressure high enough to need the hydralazine for several months. They are currently trying to downsize and have kept busy with that, in hopes of moving to [place] in January.     Blood Pressure Goal:  150/80 (due to Parkinson's, age and wide range of current home readings)  Current Medications:  Amlodipine 5mg  at 11pm and 2.5mg  at 6pm  Losartan 100 mg every morning  Hydralazine 10 mg prn SBP > 180 (none this month)   Family History:  Father had stroke at 15 (died); mother from [place] at 56  Sister 99, brother 59 both from [place]  2 of 3 sons with hypertension (both in 105's)  Social History:  No tobacco, quit 52 years ago; glass of wine most nights (red); 1 coffee/tea daily  Diet:  Mostly home cooked meals (esp since MI); no added salt; mostly chicken and fish, occasional beef (taco); fresh fruits throughout summer; stir fry veggies with olive oil; enjoys sweets - ice cream, cookies;   Exercise:  Some stretching, stationary bike at home; gave up on the Parkinsons exercise class  Home BP readings:  Home cuff has read  accurate to office in the past.  Did not bring readings today, but states occasional readings to 409 systolic.  Nothing higher than that.    Intolerances:   NKDA  Wt Readings from Last 3 Encounters:  09/02/17 179 lb (81.2 kg)  08/28/17 177 lb (80.3 kg)  08/25/17 179 lb (81.2 kg)   BP Readings from Last 3 Encounters:  09/04/17 102/60  09/02/17 95/60  08/28/17 136/79   Pulse Readings from Last 3 Encounters:  09/04/17 (!) 45  09/02/17 (!) 48  08/28/17 (!) 42    Current Outpatient Medications  Medication Sig Dispense Refill  . amLODipine (NORVASC) 2.5 MG tablet Take 1 tablet (2.5 mg total) by mouth daily. To take with amlodipine 5mg  for total of 7.5mg  daily 90 tablet 3  . amLODipine (NORVASC) 5 MG tablet Take 1 tablet (5 mg total) by mouth daily. To take with 2.5 mg to make 7.5 mg daily 90 tablet 3  . apixaban (ELIQUIS) 2.5 MG TABS tablet Take 1 tablet (2.5 mg total) by mouth 2 (two) times daily. 60 tablet 4  . carbidopa-levodopa (SINEMET IR) 25-100 MG tablet Take 1.5 tablets by mouth 4 (four) times daily. At 8, 12, 4 PM, and 8 PM 540 tablet 3  . hydrALAZINE (APRESOLINE) 10 MG tablet Take 1 tablet by mouth up to three times daily for systolic blood pressure readings above 170. 90 tablet 0  . levothyroxine (SYNTHROID, LEVOTHROID) 100 MCG tablet Take 1  tablet my mouth on an empty stomach with a full glass of water. 90 tablet 1  . losartan (COZAAR) 100 MG tablet Take 1 tablet (100 mg total) by mouth daily. 90 tablet 3  . Multiple Vitamin (MULTIVITAMIN WITH MINERALS) TABS Take 1 tablet by mouth daily. Reported on 08/01/2015    . nitroGLYCERIN (NITROSTAT) 0.4 MG SL tablet Place 1 tablet (0.4 mg total) under the tongue every 5 (five) minutes x 3 doses as needed for chest pain. 25 tablet 12  . Probiotic Product (ALIGN) 4 MG CAPS Take 1 capsule by mouth daily.    . rasagiline (AZILECT) 1 MG TABS tablet Take 1 tablet (1 mg total) by mouth daily. 90 tablet 3  . simvastatin (ZOCOR) 40 MG tablet  Take 40 mg by mouth daily.     No current facility-administered medications for this visit.     Past Medical History:  Diagnosis Date  . A-fib ([place])   . Arthritis    "right knee" (08/09/2014)  . Basal cell carcinoma   . Benign prostatic hypertrophy with urinary obstruction   . Chronic anticoagulation 12/04/2016  . Complication of anesthesia    "difficulty intubation, had to use fiberoptic 2006"  . Difficult intubation   . DJD (degenerative joint disease)   . Gout   . Hx of colonic polyps   . Hyperlipidemia   . Hypertension    sees [name]  . Hypothyroidism   . Kidney tumor    "tumor on cyst on kidney"   . 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 Mini stroke ([place])   . Parkinson disease ([place])   . Pneumonia    hx of in 1952  . Primary skin squamous cell carcinoma   . Subdural hematoma ([place]) 2006  . TIA (transient ischemic attack)    pt does not recall this hx "but may have had one today" (08/09/2014)    Blood pressure 102/60, pulse (!) 45.    Essential hypertension Patient with hypertension and orthostatic hypotension, has been mostly stable for past few months.  Does still have noticeable hypotensive episode once or twice a week, usually about 1-2 hours after breakfast.  Will resolve with cold water and sitting.    Will not make any changes to medications at this time.  Patient should continue with current regimen, and home BP checks no more than 3-4 times per week.  Should he become symptomatically hypotensive or find a need for regular hydralazine use, they can call for advice.    Tommy Medal PharmD CPP [facility] [address] [place] [address] 09/04/2017 11:10 AM

## 2017-09-30 ENCOUNTER — Telehealth: Payer: Self-pay | Admitting: Pharmacist Clinician (PhC)/ Clinical Pharmacy Specialist

## 2017-09-30 NOTE — Telephone Encounter (Signed)
Significant other called, had questions about his BP  Returned call, they have noticed a pattern of low blood pressures after breakfast for the past couple of weeks

## 2017-09-30 NOTE — Telephone Encounter (Signed)
Questions about his pattern of daily blood pressure:  They have noticed that he is usually quite hypertensive when he gets up in the morning, but after breakfast was dropping about 100 points.  Would go from 212-248'G systolic to 50-037.  Then pressure would slowly creep up thru the day, mostly staying in the 130-150 range.   For 3 days now patient has not taken his losartan 100 mg in the mornings.  His after breakfast pressure has not dropped quite as much (110-130) and he feels better.  However his afternoon pressures are now starting to rise accordingly.    I suggested that he take only 1/2 of his losartan in the mornings and see how low his pressure drops after breakfast.  He can then take the other 1/2 tablet at lunch time.  They are heading on vacation this Saturday, so will speak with him again on Friday.

## 2017-11-10 DIAGNOSIS — R69 Illness, unspecified: Secondary | ICD-10-CM | POA: Diagnosis not present

## 2017-11-30 ENCOUNTER — Other Ambulatory Visit: Payer: Self-pay

## 2017-11-30 DIAGNOSIS — I10 Essential (primary) hypertension: Secondary | ICD-10-CM

## 2017-12-01 ENCOUNTER — Telehealth: Payer: Self-pay | Admitting: Neurology

## 2017-12-01 DIAGNOSIS — G20 Parkinson's disease: Secondary | ICD-10-CM

## 2017-12-01 MED ORDER — HYDRALAZINE HCL 10 MG PO TABS: ORAL_TABLET | ORAL | 0 refills | Status: DC

## 2017-12-01 MED ORDER — CARBIDOPA-LEVODOPA 25-100 MG PO TABS: 1.5000 | ORAL_TABLET | Freq: Four times a day (QID) | ORAL | 3 refills | Status: DC

## 2017-12-01 NOTE — Telephone Encounter (Addendum)
I called pt. He needs his sinemet RX sent to the [facility] [place]. They do not have the updated RX so he has run out of his sinemet. They have that pt is to take 1 tablet QID. Dr. Rexene Alberts has signed pt's RX and I have faxed it to the [facility]. Received a receipt of confirmation.

## 2017-12-01 NOTE — Telephone Encounter (Signed)
Patient requesting a new Rx sent to the [facility]  tell# [id number]. He says the dosage is wrong on the old Rx.

## 2017-12-01 NOTE — Addendum Note (Signed)
Addended by: Lester Geddes A on: 12/01/2017 01:18 PM   Modules accepted: Orders

## 2017-12-08 ENCOUNTER — Telehealth: Payer: Self-pay | Admitting: Cardiology

## 2017-12-08 NOTE — Telephone Encounter (Signed)
° ° °  1. What dental office are you calling from? Dr Gae Bon  2. What is your office phone number? [phone]  3. What is your fax number? [phone]  4. What type of procedure is the patient having performed? Crown and 1 extraction  5. What date is procedure scheduled or is the patient there now? 12/10/17  6. What is your question (ex. Antibiotics prior to procedure, holding medication-we need to know how long dentist wants pt to hold med)? Eliquis

## 2017-12-08 NOTE — Telephone Encounter (Signed)
Patient has been made aware and verbalized his understanding.

## 2017-12-08 NOTE — Telephone Encounter (Signed)
Pt takes Eliquis for afib with CHADS2VASc score of 7 (age x2, HTN, CAD, CHF, stroke). Recommend continuing Eliquis due to single dental extraction and elevated cardiac risk.

## 2017-12-09 ENCOUNTER — Telehealth: Payer: Self-pay | Admitting: Cardiology

## 2017-12-09 NOTE — Telephone Encounter (Signed)
New Message:    *The pt has been notified that he can stay on his medication but the dentist office needs something in writing. The pt's appt is 8:00 tomorrow morning.  1. What dental office are you calling from? Dr Gae Bon  2. What is your office phone number? [phone]  3. What is your fax number? [phone]  4. What type of procedure is the patient having performed? Crown and 1 extraction  5. What date is procedure scheduled or is the patient there now? 12/10/17  6. What is your question (ex. Antibiotics prior to procedure, holding medication-we need to know how long dentist wants pt to hold med)? Eliquis

## 2017-12-09 NOTE — Telephone Encounter (Signed)
[facility] [phone] called the fax was not rec'd. She said the person that normally handles those was out of the office. She is not sure what happened but please fax again. She confirmed the fax #  [phone] to be correct.

## 2017-12-09 NOTE — Telephone Encounter (Signed)
I have refaxed the sinemet RX to the [place] [facility] at the fax number provided. Received a receipt of confirmation.

## 2017-12-10 ENCOUNTER — Telehealth: Payer: Self-pay | Admitting: Cardiology

## 2017-12-10 DIAGNOSIS — R69 Illness, unspecified: Secondary | ICD-10-CM | POA: Diagnosis not present

## 2017-12-10 NOTE — Telephone Encounter (Signed)
New Message:   Dental office is calling about clearance

## 2017-12-10 NOTE — Telephone Encounter (Signed)
Received a call from [facility] with Dr.Walrond's office.Megan Supple RPH advised to continue Eliquis for 1 tooth extraction.Note faxed to Otila Kluver at fax # [phone].

## 2017-12-11 NOTE — Telephone Encounter (Signed)
    Per the Pharmacist:  Pt takes Eliquis for afib with CHADS2VASc score of 7 (age x2, HTN, CAD, CHF, stroke). Recommend continuing Eliquis due to single dental extraction and elevated cardiac risk.  1. Dr Gae Bon  2. What is your office phone number?[phone]  3. What is your fax number?[phone]

## 2017-12-27 ENCOUNTER — Other Ambulatory Visit: Payer: Self-pay | Admitting: Primary Care

## 2017-12-27 DIAGNOSIS — E039 Hypothyroidism, unspecified: Secondary | ICD-10-CM

## 2018-01-01 ENCOUNTER — Telehealth: Payer: Self-pay | Admitting: Primary Care

## 2018-01-01 NOTE — Telephone Encounter (Signed)
Copied from [place] [id number]. Topic: Quick Communication - See Telephone Encounter >> Jan 01, 2018  1:36 PM Gardiner Ramus wrote: CRM for notification. See Telephone encounter for: 01/01/18. Deatra Canter calling from [facility] stated that she needed some clarification on medication levothyroxine (SYNTHROID, LEVOTHROID) 100 MCG tablet [[id number]] related to frequency. Please advise reference# [id number]

## 2018-01-05 ENCOUNTER — Ambulatory Visit: Payer: Medicare HMO | Admitting: Neurology

## 2018-01-05 ENCOUNTER — Telehealth: Payer: Self-pay

## 2018-01-05 ENCOUNTER — Encounter: Payer: Self-pay | Admitting: Neurology

## 2018-01-05 VITALS — BP 108/62 | HR 49 | Ht 76.0 in | Wt 183.0 lb

## 2018-01-05 DIAGNOSIS — G20 Parkinson's disease: Secondary | ICD-10-CM | POA: Diagnosis not present

## 2018-01-05 MED ORDER — RASAGILINE MESYLATE 1 MG PO TABS: 1.0000 mg | ORAL_TABLET | Freq: Every day | ORAL | 3 refills | Status: DC

## 2018-01-05 NOTE — Telephone Encounter (Signed)
Copied from [place] [id number]. Topic: Inquiry >> Jan 05, 2018  4:26 PM Reyne Dumas L wrote: Reason for CRM:   Pt thinks he is due for his next Thyroid blood test.  Pt wants to know when he should have this done.

## 2018-01-05 NOTE — Progress Notes (Signed)
Faxed RX for azilect to [facility], [phone]. Received a receipt of confirmation.

## 2018-01-05 NOTE — Progress Notes (Signed)
Subjective:    Patient ID: William [name] is a 84 y.o. male.  HPI     Interim history:   William [name] is an 84 year old right-handed gentleman with an underlying medical history of right TIA in January 2003, SDH (s/p left craniotomy in August 2006), hypertension, hypothyroidism, thyroid cancer, partial onset seizures (off Dilantin), lumbar spine disease, status post lower back surgery at L4-5 in January 2014, renal tumor, status post kidney tumor ablative surgery in March 2014, who presents for followup consultation of his right-sided predominant [name]. He is accompanied by [name] again today. I last saw him on 09/02/2017, at which time he was quite stable motor-wise, he had some stressors including regarding [name]' health and they were trying to consolidate their households with a plan to move into a retirement community condo by next year. He was advised to continue with generic Azilect once daily and Sinemet generic 1-1/2 pills 4 times a day. He was encouraged to hydrate better with water.  Today, 01/05/2018 (all dictated new, as well as above notes, some dictation done in note pad or Word, outside of chart, may appear as copied):    He reports having had a recent fall. He was in the garage and was able to halfway hold onto the door. He scraped his left elbow, he felt that his right knee gave out. He has had some issues with right knee pain and sometimes wears a Velcro brace which seems to help. He did not have a brace on at the time. He landed on his behind, no major bruising but did scrape his left elbow and it bled longer because he is on blood thinner. Generally speaking he feels quite well, stress is still they are what with there house consolidation and downsizing project. They can visit their new home soon for a walk through. Plan is to move in their first quarter of next year. He tries to exercise. He does not currently pay golf as he does not feel he has the stamina. He  tries to hydrate well with water. He tries to be consistent with his Sinemet regimen on a 4 hourly basis.   The patient's allergies, current medications, family history, past medical history, past social history, past surgical history and problem list were reviewed and updated as appropriate.    Previously (copied from previous notes for reference):   I saw him on 05/05/2017, at which time he reported doing quite well, no recent falls or recent illness. They were planning to move into a retirement community by the end of this year. He was trying to exercise regularly. Blood pressure was stable. He had seen pulmonology for a lung nodule which was stable. I suggested we continue with his current medication regimen.        I saw him on 12/31/2016, at which time he reported intermittent lightheadedness. He had some blood pressure fluctuations with really high values at times. He was hospitalized in August 2018 for a non-STEMI. He had significant orthostatic hypotension during the office visit. He was advised to talk to his providers about reducing his blood pressure medication. He was advised to not continue with the boxing classes for [name] but exercise within his limitations at home. He was advised to try melatonin at night for sleep issues.     I saw him on 06/27/2016, at which time he reported doing well, he had a trip to [place] in [place] planned for springtime. He had no recent falls. He was supposed  to be on a smaller dose of Eliquis, namely 2.5 mg twice a day. He continued to participate in the boxing class for [name] and felt that they were helpful.   I saw him on 03/05/2016, at which time he reported doing okay. He was during his stay active, was participating in [employer] steady boxing, which he felt was helpful. He was avoiding strenuous parts of the exercise. He was interested in participating in a new drug study at [facility]. This was to investigate apomorphine  sublingual to decrease off time. He was on Azilect once daily. He was taking Sinemet 4 times a day. He was trying to hydrate well, reported no recent falls. I suggested he increase the Sinemet to 1-1/2 pills 4 times a day. I suggested he continue with generic Azilect once daily.   I saw him on 10/26/2015, at which time he reported doing quite well, had some stumbling episodes, no actual falls, was trying to drink enough water and was still physically active. He had signed up for rock steady boxing classes but [name] noted that he would get too exhausted.   I saw him on 06/22/2015, at which time he reported doing fairly well. He had finished outpatient OT, PT, and ST. Unfortunately, he did take a fall in the garage couple of months prior as he was coming in from the graduate, stepped backwards on the stairs and slipped off falling backwards, landed on his behind, head struck the car, he denied loss of consciousness or headache or bruise and no sequelae were reported. He was having rails installed in the garage entrance. Was still trying to stay active, playing golf. We mutually agreed to continue with Sinemet 4 times a day and Azilect once daily. We talked about fall risk and gait safety and fall precautions.   I saw him on 02/23/2015, at which time he reported doing fairly well. He was playing golf regularly. His right knee was bothering him. He had no recent falls. He was trying to hydrate well enough. He had no new issues with A. fib and no new complaints otherwise. I noted right knee swelling. He was advised to follow-up with his primary care physician for this. We mutually agreed to keep his Parkinson's medications the same.    I saw him on 11/01/2014 at which time he reported a recent diagnosis with A. fib. He was admitted to the hospital in April. I reviewed the hospital records from 07/17/2014 through 07/18/2014. He was started on Xarelto. He was then re-admitted on 08/09/14 to 08/10/14 due to altered  mental status and slurring of speech and was suspected to have a TIA. He was seen by neuro in consultation and a baby aspirin was added to Xarelto. He presented back to the ER on 08/18/14. He a Haliimaile wo contrast on  08/18/2014 , which was negative for any acute changes. He was seen by the Neurologist in the emergency room and it was felt that a full TIA workup was not necessary at the time. He had presented with hypertension and some slurring of speech. He was switched from Xarelto to Eliquis, because the Xarelto is not on formulary at the [place].  He endorsed recent stressors, including the recent passing of his younger brother a week prior with advanced Alzheimer's disease.    He had an MRI brain and MRA head on 08/09/14: MRI HEAD: No acute intracranial process, specifically no acute ischemia. Involutional changes. Mild white matter changes suggest chronic small vessel ischemic disease.  MRA HEAD: No acute large vessel occlusion or high-grade stenosis. Mid grade stenosis RIGHT P2/3 segment.    I suggested we continue with Sinemet at 4 times a day. He was advised to continue with Azilect as well. He was advised to drink more water. In the interim, we restarted outpatient physical therapy, occupational therapy and speech therapy.   I saw him on 04/29/2014, at which time he reported doing well overall but he was more fatigued and had some excessive daytime somnolence. Memory was stable. His mood was stable. He noted some blood pressure fluctuations. Sometimes he had a dull headache and sometimes he felt lightheaded. He had some anxiety over his lady friend's health, as she had fallen and broken rib. He continued to walk regularly and played golf. He had no recent falls. I asked him to continue with his medications, Azilect once daily and Sinemet 4 times a day.   I saw him on 10/28/2013, at which time he reported doing well. In particular, had no cognitive complaints, no mood issues, and continued to play golf 3 times  a week. He was driving well. I suggested an increase in his levodopa to 1 pill 4 times a day of the 25-100 milligrams strength. [facility] and had seen a [place] neurologist as well.      I saw him on 12/07/2012, at which time I felt that he was doing well on Azilect and levodopa. He called in December 2014 with problems with blood pressure fluctuations. He was wondering if this came from the [place] but I did not think it was due to the Azilect per se. I was reluctant to take him off of it.    I first met him on 06/03/2012 and he previously followed by Dr. Morene Antu. He has had primarily right-sided symptoms with regards to his Parkinson's, diagnosed in 2010 with symptoms dating back to late 2009 or early 2010. He had briefly tried Mirapex but was taken off d/t hypotension. L spine MRI in June 2013 showed renal cysts, degenerative joint disease most prominent at L4-5. He tried acupuncture. His MRI of the lumbar spine showed abnormalities with his kidney with a cyst and a tumor and he had ablative surgery on 07/10/12 and had a FU CT done. He had lower back surgery on 05/14/12.  I saw him back on 09/03/2012 and I suggested starting Azilect. He has been tolerating it well and both he and his girlfriend felt that he did better with it in terms of dexterity and fine motor control. He had some hypotension and lightheadedness and reduced his BP medication. He has been having some issue with gout.       His Past Medical History Is Significant For: Past Medical History:  Diagnosis Date  . A-fib ([place])   . Arthritis    "right knee" (08/09/2014)  . Basal cell carcinoma   . Benign prostatic hypertrophy with urinary obstruction   . Chronic anticoagulation 12/04/2016  . Complication of anesthesia    "difficulty intubation, had to use fiberoptic 2006"  . Difficult intubation   . DJD (degenerative joint disease)   . Gout   . Hx of colonic polyps   . Hyperlipidemia   .  Hypertension    sees [name]  . Hypothyroidism   . Kidney tumor    "tumor on cyst on kidney"   . 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 Mini stroke (  [place])   . Parkinson disease ([place])   . Pneumonia    hx of in 1952  . Primary skin squamous cell carcinoma   . Subdural hematoma ([place]) 2006  . TIA (transient ischemic attack)    pt does not recall this hx "but may have had one today" (08/09/2014)    His Past Surgical History Is Significant For: Past Surgical History:  Procedure Laterality Date  . APPENDECTOMY    . BACK SURGERY    . CATARACT EXTRACTION W/ INTRAOCULAR LENS  IMPLANT, BILATERAL Bilateral ~ 1998  . CHOLECYSTECTOMY    . CRYOABLATION  07-10-12   "tumor on cyst on my kidney; had an ablation"  . EYE SURGERY    . INGUINAL HERNIA REPAIR Right   . IR GENERIC HISTORICAL  11/09/2015   IR RADIOLOGIST EVAL & MGMT 11/09/2015 [name], MD [facility] INTERV RAD  . IR RADIOLOGIST EVAL & MGMT  08/13/2016  . KIDNEY SURGERY    . KNEE ARTHROSCOPY Right   . LEFT HEART CATH AND CORONARY ANGIOGRAPHY N/A 12/05/2016   Procedure: LEFT HEART CATH AND CORONARY ANGIOGRAPHY;  Surgeon: [name], [name], MD;  Location: [facility];  Service: Cardiovascular;  Laterality: N/A;  . LUMBAR LAMINECTOMY/DECOMPRESSION MICRODISCECTOMY  05/14/2012   Procedure: LUMBAR LAMINECTOMY/DECOMPRESSION MICRODISCECTOMY 1 LEVEL;  Surgeon: [name], MD;  Location: [facility];  Service: Neurosurgery;  Laterality: Left;  Left Lumbar four-five Laminectomy/Diskectomy/Far lateral diskectomy  . SKIN CANCER EXCISION  "several"   "back of my neck; right eye; clavicle; nose"  . SUBDURAL HEMATOMA EVACUATION VIA CRANIOTOMY  2006  . THYROIDECTOMY     medullary carcinoma  . TONSILLECTOMY      His Family History Is Significant For: Family History  Problem Relation Age of Onset  . Stroke Mother   . Stroke Father   . Heart disease Father   . Alzheimer's disease Brother   . Alzheimer's  disease Sister     His Social History Is Significant For: Social History   Socioeconomic History  . Marital status: Widowed    Spouse name: Not on file  . Number of children: 3  . Years of education: 76  . Highest education level: Not on file  Occupational History  . Occupation: retired  Scientific laboratory technician  . Financial resource strain: Not on file  . Food insecurity:    Worry: Not on file    Inability: Not on file  . Transportation needs:    Medical: Not on file    Non-medical: Not on file  Tobacco Use  . Smoking status: Former Smoker    Packs/day: 0.80    Years: 15.00    Pack years: 12.00    Types: Cigarettes    Last attempt to quit: 04/15/1965    Years since quitting: 52.7  . Smokeless tobacco: Never Used  Substance and Sexual Activity  . Alcohol use: Yes    Alcohol/week: 5.0 standard drinks    Types: 5 Glasses of wine per week    Comment: 1 glass of wine every night (6oz glass)  . Drug use: No  . Sexual activity: Yes  Lifestyle  . Physical activity:    Days per week: Not on file    Minutes per session: Not on file  . Stress: Not on file  Relationships  . Social connections:    Talks on phone: Not on file    Gets together: Not on file    Attends religious service: Not on file  Active member of club or organization: Not on file    Attends meetings of clubs or organizations: Not on file    Relationship status: Not on file  Other Topics Concern  . Not on file  Social History Narrative   Married.   Lives at home with significant other.   Retried. 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   Enjoys golfing, yard work, spending time with family.      His Allergies Are:  No Known Allergies:   His Current Medications Are:  Outpatient Encounter Medications as of 01/05/2018  Medication Sig  . amLODipine (NORVASC) 2.5 MG tablet Take 1 tablet (2.5 mg total) by mouth daily. To take with amlodipine 60m for total of 7.575mdaily  . amLODipine (NORVASC) 5 MG tablet Take 1  tablet (5 mg total) by mouth daily. To take with 2.5 mg to make 7.5 mg daily  . apixaban (ELIQUIS) 2.5 MG TABS tablet Take 1 tablet (2.5 mg total) by mouth 2 (two) times daily.  . carbidopa-levodopa (SINEMET IR) 25-100 MG tablet Take 1.5 tablets by mouth 4 (four) times daily. At 8, 12, 4 PM, and 8 PM  . hydrALAZINE (APRESOLINE) 10 MG tablet Take 1 tablet by mouth up to three times daily for systolic blood pressure readings above 170.  . levothyroxine (SYNTHROID, LEVOTHROID) 100 MCG tablet TAKE 1 TABLET ON AN EMPTY  STOMACH WITH A FULL GLASS  OF WATER  . losartan (COZAAR) 100 MG tablet Take 1 tablet (100 mg total) by mouth daily. (Patient taking differently: Take 50 mg by mouth 2 (two) times daily. )  . Multiple Vitamin (MULTIVITAMIN WITH MINERALS) TABS Take 1 tablet by mouth daily. Reported on 08/01/2015  . nitroGLYCERIN (NITROSTAT) 0.4 MG SL tablet Place 1 tablet (0.4 mg total) under the tongue every 5 (five) minutes x 3 doses as needed for chest pain.  . Probiotic Product (ALIGN) 4 MG CAPS Take 1 capsule by mouth daily.  . rasagiline (AZILECT) 1 MG TABS tablet Take 1 tablet (1 mg total) by mouth daily.  . simvastatin (ZOCOR) 40 MG tablet Take 40 mg by mouth daily.   No facility-administered encounter medications on file as of 01/05/2018.   :  Review of Systems:  Out of a complete 14 point review of systems, all are reviewed and negative with the exception of these symptoms as listed below: Review of Systems  Neurological:       Pt presents today to discuss his PD. Pt reports a fall recently. Pt reports that his BP has been more stable.    Objective:  Neurological Exam  Physical Exam Physical Examination:   Vitals:   01/05/18 1033  BP: 108/62  Pulse: (!) 49    General Examination: The patient is a very pleasant [age].o. male in no acute distress. He appears well-developed and well-nourished and well groomed. Good spirits.   HEENT:Normocephalic, atraumatic, pupils are equal, round  and reactive to light and accommodation. Extraocular trackingshows mild saccadic breakdown. Speech is moderately hypophonic and mildly to moderately dysarthric. Hearing is grossly intact, perhaps mildly impaired. Oropharynx exam revealsmoderate mouth dryness, tongue protrudes centrally and palate elevates symmetrically. No other abnormal findings, he has moderate to severe nuchal rigidity and decrease in passive range of motion.  Chest:Clear to auscultation without wheezing, rhonchi or crackles noted.  Heart:S1+S2+0, regular and normal without murmurs, rubs or gallops noted.   Abdomen:Soft, non-tender and non-distended with normal bowel sounds appreciated on auscultation.  Extremities:There is nopitting edema in the  distal lower extremities bilaterally.   Skin: Warm and dry without trophic changes noted. Mild bruising on hands, stable, chronic.  Musculoskeletal: exam reveals no obvious joint deformities, tenderness or joint swelling or redness. Some R knee discomfort. Scrape and healing bruise L elbow.   Neurologically:  Mental status: The patient is awake, alert and oriented in all 4 spheres. Hisimmediate and remote memory, attention, language skills and fund of knowledge are appropriate. There is no evidence of aphasia, agnosia, apraxia or anomia. Speech is clear with normal prosody and enunciation. Thought process is linear. Mood is normaland affect is normal.  Cranial nerves II - XII are as described above under HEENT exam.  Motor exam: thin muscle bulk globally, good strength for age. He has increased tone in the right more than left upper and lower extremities. He has an intermittent resting tremor in both upper extremities, right more than left. Fine motor skills are moderately impaired on the right andslightly better on theleft. He has mild intermittent dyskinesias, mostly on the right side and some cocontraction. Reflexes are 1+ throughout. He stands up with mild  difficulty andpushes himself up. Posture is stable, mildly stooped for age.He walks with decreased stride length and decrease pace,slightly decreased arm swing bilaterally, right more than left. Sensory exam is intact to light touch. Cerebellar testing shows no additional concern for dysmetria or intention tremor or gait ataxia.  Assessment and Plan:   In summary, William Gopaul Broughtonis a very pleasant 84 year old malewithan underlying medical history of right TIA in January 2003, SDH (s/p left craniotomy in August 2006), hypertension, hypothyroidism, thyroid cancer, partial onset seizures (off Dilantin), lumbar spine disease, status-post lower back surgery at L4-5 in January 2014, renal tumor, status post kidney tumor ablative surgery in March 2014, who presents for followup consultation of his advanced right-sided predominant [name],complicated by some autonomic dysregulation, history of falls, and overall muscular deconditioning and advancing age. His blood pressure is on the lower end but stable, heart rate in the high 40s and lower 50s typically in stable.he is currently on generic Azilect 1 mg once daily and generic Sinemet 1-1/2 pills 4 times a day. He is advised to continue his medications at the same doses and times. He gets his prescriptions from the [place], I will update his Azilect prescription. His Sinemet prescription is up to date. He is advised to continue with exercises regularly, try to use his right knee brace more consistently as it seems to help and also add some more strength training for his upper and lower body. I suggested a 4 month follow-up, sooner if needed. He is advised to stay well hydrated with water and continue with regular exercise, stay well rested and we talked about fall risk and fall prevention again today. I spent 25 minutes in total face-to-face time with the patient, more than 50% of which was spent in counseling and coordination of care, reviewing test  results, reviewing medication and discussing or reviewing the diagnosis of PD, its prognosis and treatment options. Pertinent laboratory and imaging test results that were available during this visit with the patient were reviewed by me and considered in my medical decision making (see chart for details).

## 2018-01-05 NOTE — Telephone Encounter (Signed)
It looks like he's due for CPE/MWV. He can have labs done with [facility]. Please schedule for this at his convenience.

## 2018-01-05 NOTE — Patient Instructions (Signed)
Your exam looks good, I am very pleased. Please continue your exercise regimen, keep your meds the same. If possible, add more strength training for your arms and legs. You don't have to lift heavy weights of course. Just a little more resistance training would help to build more muscle strength. Take advantage of the gym. Stay well hydrated and rested. I will fax the Azilect Rx to the [place].

## 2018-01-05 NOTE — Telephone Encounter (Signed)
Pt last seen for thyroid FU and TSH done on 12/31/16. No future appt scheduled. Does pt need lab appt only or do you want lab appt and then office FU?

## 2018-01-06 DIAGNOSIS — R69 Illness, unspecified: Secondary | ICD-10-CM | POA: Diagnosis not present

## 2018-01-06 DIAGNOSIS — E039 Hypothyroidism, unspecified: Secondary | ICD-10-CM

## 2018-01-06 NOTE — Telephone Encounter (Signed)
Spoken to [facility] and confirm correct direction.

## 2018-01-06 NOTE — Telephone Encounter (Signed)
FYI

## 2018-01-09 ENCOUNTER — Ambulatory Visit (INDEPENDENT_AMBULATORY_CARE_PROVIDER_SITE_OTHER): Payer: Medicare HMO

## 2018-01-09 VITALS — BP 98/62 | HR 45 | Temp 97.6°F | Ht 75.0 in | Wt 180.2 lb

## 2018-01-09 DIAGNOSIS — E039 Hypothyroidism, unspecified: Secondary | ICD-10-CM | POA: Diagnosis not present

## 2018-01-09 DIAGNOSIS — Z23 Encounter for immunization: Secondary | ICD-10-CM | POA: Diagnosis not present

## 2018-01-09 DIAGNOSIS — Z0000 Encounter for general adult medical examination without abnormal findings: Secondary | ICD-10-CM | POA: Diagnosis not present

## 2018-01-09 LAB — TSH: TSH: 2.54 u[IU]/mL (ref 0.35–4.50)

## 2018-01-09 NOTE — Progress Notes (Signed)
PCP notes:   Health maintenance:  Flu vaccine - administered  Abnormal screenings:   Hearing - failed  Hearing Screening   125Hz  250Hz  500Hz  1000Hz  2000Hz  3000Hz  4000Hz  6000Hz  8000Hz   Right ear:   40 0 40  0    Left ear:   40 40 40  0     Fall risk - hx of single fall Fall Risk  01/09/2018 01/05/2018 09/02/2017 06/20/2016 03/05/2016  Falls in the past year? Yes Yes No No Yes  Comment "knee buckled" under patient and he fell; laceration to left elbow - - - -  Number falls in past yr: 1 1 - - 1  Injury with Fall? Yes Yes - - No  Risk Factor Category  High Fall Risk High Fall Risk - - -  Risk for fall due to : Impaired balance/gait Impaired balance/gait - - -  Follow up - Education provided - - -   Patient concerns:   None  Nurse concerns:  None  Next PCP appt:   01/14/2018 @ 0940

## 2018-01-09 NOTE — Patient Instructions (Addendum)
[name] , Thank you for taking time to come for your Medicare Wellness Visit. I appreciate your ongoing commitment to your health goals. Please review the following plan we discussed and let me know if I can assist you in the future.   These are the goals we discussed: Goals    . Increase physical activity     Starting 01/09/2018, I will continue to exercise for 60 minutes 3 days per week.       This is a list of the screening recommended for you and due dates:  Health Maintenance  Topic Date Due  . Tetanus Vaccine  12/01/2019  . Flu Shot  Completed  . Pneumonia vaccines  Completed   Preventive Care for Adults  A healthy lifestyle and preventive care can promote health and wellness. Preventive health guidelines for adults include the following key practices.  . A routine yearly physical is a good way to check with your health care provider about your health and preventive screening. It is a chance to share any concerns and updates on your health and to receive a thorough exam.  . Visit your dentist for a routine exam and preventive care every 6 months. Brush your teeth twice a day and floss once a day. Good oral hygiene prevents tooth decay and gum disease.  . The frequency of eye exams is based on your age, health, family medical history, use  of contact lenses, and other factors. Follow your health care provider's recommendations for frequency of eye exams.  . Eat a healthy diet. Foods like vegetables, fruits, whole grains, low-fat dairy products, and lean protein foods contain the nutrients you need without too many calories. Decrease your intake of foods high in solid fats, added sugars, and salt. Eat the right amount of calories for you. Get information about a proper diet from your health care provider, if necessary.  . Regular physical exercise is one of the most important things you can do for your health. Most adults should get at least 150 minutes of moderate-intensity  exercise (any activity that increases your heart rate and causes you to sweat) each week. In addition, most adults need muscle-strengthening exercises on 2 or more days a week.  Silver Sneakers may be a benefit available to you. To determine eligibility, you may visit the website: www.silversneakers.com or contact program at (936) 245-6301 Mon-Fri between 8AM-8PM.   . Maintain a healthy weight. The body mass index (BMI) is a screening tool to identify possible weight problems. It provides an estimate of body fat based on height and weight. Your health care provider can find your BMI and can help you achieve or maintain a healthy weight.   For adults 20 years and older: ? A BMI below 18.5 is considered underweight. ? A BMI of 18.5 to 24.9 is normal. ? A BMI of 25 to 29.9 is considered overweight. ? A BMI of 30 and above is considered obese.   . Maintain normal blood lipids and cholesterol levels by exercising and minimizing your intake of saturated fat. Eat a balanced diet with plenty of fruit and vegetables. Blood tests for lipids and cholesterol should begin at age 56 and be repeated every 5 years. If your lipid or cholesterol levels are high, you are over 50, or you are at high risk for heart disease, you may need your cholesterol levels checked more frequently. Ongoing high lipid and cholesterol levels should be treated with medicines if diet and exercise are not working.  Marland Kitchen  If you smoke, find out from your health care provider how to quit. If you do not use tobacco, please do not start.  . If you choose to drink alcohol, please do not consume more than 2 drinks per day. One drink is considered to be 12 ounces (355 mL) of beer, 5 ounces (148 mL) of wine, or 1.5 ounces (44 mL) of liquor.  . If you are 35-34 years old, ask your health care provider if you should take aspirin to prevent strokes.  . Use sunscreen. Apply sunscreen liberally and repeatedly throughout the day. You should seek shade  when your shadow is shorter than you. Protect yourself by wearing long sleeves, pants, a wide-brimmed hat, and sunglasses year round, whenever you are outdoors.  . Once a month, do a whole body skin exam, using a mirror to look at the skin on your back. Tell your health care provider of new moles, moles that have irregular borders, moles that are larger than a pencil eraser, or moles that have changed in shape or color.

## 2018-01-09 NOTE — Progress Notes (Signed)
Subjective:   William [name] is a 84 y.o. male who presents for Medicare Annual/Subsequent preventive examination.  Review of Systems:  N/A Cardiac Risk Factors include: advanced age (>42men, >57 women);male gender;dyslipidemia;hypertension     Objective:    Vitals: BP 98/62 (BP Location: Left Arm, Patient Position: Sitting, Cuff Size: Normal)   Pulse (!) 45   Temp 97.6 F (36.4 C) (Oral)   Ht 6\' 3"  (1.905 m) Comment: shoes  Wt 180 lb 4 oz (81.8 kg)   SpO2 96%   BMI 22.53 kg/m   Body mass index is 22.53 kg/m.  Advanced Directives 01/09/2018 12/03/2016 08/26/2016 06/20/2016 08/29/2015 03/02/2015 02/28/2015  Does Patient Have a Medical Advance Directive? Yes Yes Yes Yes Yes Yes Yes  Type of Paramedic of [employer] [facility];Living will [place];Living will [place];Living will [place];Living will Living will;Healthcare Power of [place]  Does patient want to make changes to medical advance directive? - No - Patient declined - - No - Patient declined No - Patient declined No - Patient declined  Copy of [place] in Chart? No - copy requested No - copy requested No - copy requested Yes No - copy requested No - copy requested No - copy requested  Would patient like information on creating a medical advance directive? - - - - - - -  Pre-existing out of facility DNR order (yellow form or pink MOST form) - - - - - - -    Tobacco Social History   Tobacco Use  Smoking Status Former Smoker  . Packs/day: 0.80  . Years: 15.00  . Pack years: 12.00  . Types: Cigarettes  . Last attempt to quit: 04/15/1965  . Years since quitting: 52.7  Smokeless Tobacco Never Used     Counseling given: No   Clinical Intake:     Pain Score: 0-No pain                 Past Medical History:  Diagnosis Date  . A-fib ([place])   . Arthritis    "right knee" (08/09/2014)  . Basal cell carcinoma   . Benign prostatic hypertrophy with urinary obstruction   . Chronic anticoagulation 12/04/2016  . Complication of anesthesia    "difficulty intubation, had to use fiberoptic 2006"  . Difficult intubation   . DJD (degenerative joint disease)   . Gout   . Hx of colonic polyps   . Hyperlipidemia   . Hypertension    sees [name]  . Hypothyroidism   . Kidney tumor    "tumor on cyst on kidney"   . 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 Mini stroke ([place])   . Parkinson disease ([place])   . Pneumonia    hx of in 1952  . Primary skin squamous cell carcinoma   . Subdural hematoma ([place]) 2006  . TIA (transient ischemic attack)    pt does not recall this hx "but may have had one today" (08/09/2014)   Past Surgical History:  Procedure Laterality Date  . APPENDECTOMY    . BACK SURGERY    . CATARACT EXTRACTION W/ INTRAOCULAR LENS  IMPLANT, BILATERAL Bilateral ~ 1998  . CHOLECYSTECTOMY    . CRYOABLATION  07-10-12   "tumor on cyst on my kidney; had an ablation"  . EYE SURGERY    . INGUINAL HERNIA REPAIR Right   .  IR GENERIC HISTORICAL  11/09/2015   IR RADIOLOGIST EVAL & MGMT 11/09/2015 [name], MD [facility] INTERV RAD  . IR RADIOLOGIST EVAL & MGMT  08/13/2016  . KIDNEY SURGERY    . KNEE ARTHROSCOPY Right   . LEFT HEART CATH AND CORONARY ANGIOGRAPHY N/A 12/05/2016   Procedure: LEFT HEART CATH AND CORONARY ANGIOGRAPHY;  Surgeon: [name], [name], MD;  Location: [facility];  Service: Cardiovascular;  Laterality: N/A;  . LUMBAR LAMINECTOMY/DECOMPRESSION MICRODISCECTOMY  05/14/2012   Procedure: LUMBAR LAMINECTOMY/DECOMPRESSION MICRODISCECTOMY 1 LEVEL;  Surgeon: [name], MD;  Location: [facility];  Service: Neurosurgery;  Laterality: Left;  Left Lumbar four-five Laminectomy/Diskectomy/Far lateral diskectomy  . SKIN CANCER EXCISION  "several"   "back of my neck; right eye; clavicle; nose"  . SUBDURAL  HEMATOMA EVACUATION VIA CRANIOTOMY  2006  . THYROIDECTOMY     medullary carcinoma  . TONSILLECTOMY     Family History  Problem Relation Age of Onset  . Stroke Mother   . Stroke Father   . Heart disease Father   . Alzheimer's disease Brother   . Alzheimer's disease Sister    Social History   Socioeconomic History  . Marital status: Widowed    Spouse name: Not on file  . Number of children: 3  . Years of education: 33  . Highest education level: Not on file  Occupational History  . Occupation: retired  Scientific laboratory technician  . Financial resource strain: Not on file  . Food insecurity:    Worry: Not on file    Inability: Not on file  . Transportation needs:    Medical: Not on file    Non-medical: Not on file  Tobacco Use  . Smoking status: Former Smoker    Packs/day: 0.80    Years: 15.00    Pack years: 12.00    Types: Cigarettes    Last attempt to quit: 04/15/1965    Years since quitting: 52.7  . Smokeless tobacco: Never Used  Substance and Sexual Activity  . Alcohol use: Yes    Alcohol/week: 5.0 standard drinks    Types: 5 Glasses of wine per week    Comment: 1 glass of wine every night (6oz glass)  . Drug use: No  . Sexual activity: Yes  Lifestyle  . Physical activity:    Days per week: Not on file    Minutes per session: Not on file  . Stress: Not on file  Relationships  . Social connections:    Talks on phone: Not on file    Gets together: Not on file    Attends religious service: Not on file    Active member of club or organization: Not on file    Attends meetings of clubs or organizations: Not on file    Relationship status: Not on file  Other Topics Concern  . Not on file  Social History Narrative   Married.   Lives at home with significant other.   Retried. 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   Enjoys golfing, yard work, spending time with family.      Outpatient Encounter Medications as of 01/09/2018  Medication Sig  . amLODipine (NORVASC) 2.5 MG  tablet Take 1 tablet (2.5 mg total) by mouth daily. To take with amlodipine 5mg  for total of 7.5mg  daily  . amLODipine (NORVASC) 5 MG tablet Take 1 tablet (5 mg total) by mouth daily. To take with 2.5 mg to make 7.5 mg daily  . apixaban (ELIQUIS) 2.5  MG TABS tablet Take 1 tablet (2.5 mg total) by mouth 2 (two) times daily.  . carbidopa-levodopa (SINEMET IR) 25-100 MG tablet Take 1.5 tablets by mouth 4 (four) times daily. At 8, 12, 4 PM, and 8 PM  . hydrALAZINE (APRESOLINE) 10 MG tablet Take 1 tablet by mouth up to three times daily for systolic blood pressure readings above 170.  . levothyroxine (SYNTHROID, LEVOTHROID) 100 MCG tablet TAKE 1 TABLET ON AN EMPTY  STOMACH WITH A FULL GLASS  OF WATER  . losartan (COZAAR) 100 MG tablet Take 1 tablet (100 mg total) by mouth daily. (Patient taking differently: Take 50 mg by mouth 2 (two) times daily. )  . Multiple Vitamin (MULTIVITAMIN WITH MINERALS) TABS Take 1 tablet by mouth daily. Reported on 08/01/2015  . nitroGLYCERIN (NITROSTAT) 0.4 MG SL tablet Place 1 tablet (0.4 mg total) under the tongue every 5 (five) minutes x 3 doses as needed for chest pain.  . Probiotic Product (ALIGN) 4 MG CAPS Take 1 capsule by mouth daily.  . rasagiline (AZILECT) 1 MG TABS tablet Take 1 tablet (1 mg total) by mouth daily.  . simvastatin (ZOCOR) 40 MG tablet Take 40 mg by mouth daily.   No facility-administered encounter medications on file as of 01/09/2018.     Activities of Daily Living In your present state of health, do you have any difficulty performing the following activities: 01/09/2018  Hearing? N  Vision? N  Difficulty concentrating or making decisions? N  Walking or climbing stairs? N  Dressing or bathing? N  Doing errands, shopping? N  Preparing Food and eating ? N  Using the Toilet? N  In the past six months, have you accidently leaked urine? Y  Do you have problems with loss of bowel control? N  Managing your Medications? N  Managing your Finances? N   Housekeeping or managing your Housekeeping? N  Some recent data might be hidden    Patient Care Team: Pleas Koch, NP as PCP - General (Nurse Practitioner) Minus Breeding, MD as Consulting Physician (Cardiology) [name], MD as Consulting Physician (Interventional Radiology)   Assessment:   This is a routine wellness examination for [place].   Hearing Screening   125Hz  250Hz  500Hz  1000Hz  2000Hz  3000Hz  4000Hz  6000Hz  8000Hz   Right ear:   40 0 40  0    Left ear:   40 40 40  0    Vision Screening Comments: Vision exam in Nov 2018 with Dr. Gershon Crane    Exercise Activities and Dietary recommendations Current Exercise Habits: Home exercise routine, Type of exercise: Other - see comments;treadmill(bicycling, resistance training), Time (Minutes): 60, Frequency (Times/Week): 3, Weekly Exercise (Minutes/Week): 180, Exercise limited by: None identified  Goals    . Increase physical activity     Starting 01/09/2018, I will continue to exercise for 60 minutes 3 days per week.       Fall Risk Fall Risk  01/09/2018 01/05/2018 09/02/2017 06/20/2016 03/05/2016  Falls in the past year? Yes Yes No No Yes  Comment "knee buckled" under patient and he fell; laceration to left elbow - - - -  Number falls in past yr: 1 1 - - 1  Injury with Fall? Yes Yes - - No  Risk Factor Category  High Fall Risk High Fall Risk - - -  Risk for fall due to : Impaired balance/gait Impaired balance/gait - - -  Follow up - Education provided - - -   Depression Screen PHQ 2/9 Scores 01/09/2018 06/20/2016  06/15/2015 12/21/2013  PHQ - 2 Score 0 0 0 0  PHQ- 9 Score 0 - - -    Cognitive Function MMSE - Mini Mental State Exam 01/09/2018 06/20/2016  Orientation to time 5 5  Orientation to Place 5 5  Registration 3 3  Attention/ Calculation 0 0  Recall 3 2  Recall-comments - pt was unable to recall 1 of 3 words  Language- name 2 objects 0 0  Language- repeat 1 1  Language- follow 3 step command 3 3  Language- read &  follow direction 0 0  Write a sentence 0 0  Copy design 0 0  Total score 20 19     PLEASE NOTE: A Mini-Cog screen was completed. Maximum score is 20. A value of 0 denotes this part of [place] MMSE was not completed or the patient failed this part of the Mini-Cog screening.   Mini-Cog Screening Orientation to Time - Max 5 pts Orientation to Place - Max 5 pts Registration - Max 3 pts Recall - Max 3 pts Language Repeat - Max 1 pts Language Follow 3 Step Command - Max 3 pts     Immunization History  Administered Date(s) Administered  . Influenza Split 02/04/2011, 01/28/2012  . Influenza Whole 02/05/2006, 01/06/2008, 01/27/2009, 01/31/2010  . Influenza,inj,Quad PF,6+ Mos 02/23/2013, 12/21/2013, 01/24/2015, 12/26/2015, 12/31/2016, 01/09/2018  . Pneumococcal Conjugate-13 06/20/2016  . Pneumococcal Polysaccharide-23 04/15/1996, 05/09/2009  . Td 04/15/1998, 11/30/2009  . Zoster 12/14/2013   Screening Tests Health Maintenance  Topic Date Due  . TETANUS/TDAP  12/01/2019  . INFLUENZA VACCINE  Completed  . PNA vac Low Risk Adult  Completed       Plan:     I have personally reviewed, addressed, and noted the following in the patient's chart:  A. Medical and social history B. Use of alcohol, tobacco or illicit drugs  C. Current medications and supplements D. Functional ability and status E.  Nutritional status F.  Physical activity G. Advance directives H. List of other physicians I.  Hospitalizations, surgeries, and ER visits in previous 12 months J.  [place] to include hearing, vision, cognitive, depression L. Referrals and appointments - none  In addition, I have reviewed and discussed with patient certain preventive protocols, quality metrics, and best practice recommendations. A written personalized care plan for preventive services as well as general preventive health recommendations were provided to patient.  See attached scanned questionnaire for additional  information.   Signed,   Lindell Noe, MHA, BS, LPN Health Coach

## 2018-01-11 DIAGNOSIS — E039 Hypothyroidism, unspecified: Secondary | ICD-10-CM

## 2018-01-11 NOTE — Progress Notes (Signed)
I reviewed health advisor's note, was available for consultation, and agree with documentation and plan.  

## 2018-01-12 MED ORDER — LEVOTHYROXINE SODIUM 100 MCG PO TABS: ORAL_TABLET | ORAL | 0 refills | Status: DC

## 2018-01-14 ENCOUNTER — Ambulatory Visit (INDEPENDENT_AMBULATORY_CARE_PROVIDER_SITE_OTHER): Payer: Medicare HMO | Admitting: Primary Care

## 2018-01-14 ENCOUNTER — Encounter: Payer: Self-pay | Admitting: Primary Care

## 2018-01-14 VITALS — BP 100/62 | HR 55 | Temp 98.0°F | Ht 75.0 in | Wt 180.0 lb

## 2018-01-14 DIAGNOSIS — I712 Thoracic aortic aneurysm, without rupture, unspecified: Secondary | ICD-10-CM

## 2018-01-14 DIAGNOSIS — I214 Non-ST elevation (NSTEMI) myocardial infarction: Secondary | ICD-10-CM | POA: Diagnosis not present

## 2018-01-14 DIAGNOSIS — N400 Enlarged prostate without lower urinary tract symptoms: Secondary | ICD-10-CM

## 2018-01-14 DIAGNOSIS — N183 Chronic kidney disease, stage 3 unspecified: Secondary | ICD-10-CM

## 2018-01-14 DIAGNOSIS — E7849 Other hyperlipidemia: Secondary | ICD-10-CM

## 2018-01-14 DIAGNOSIS — I10 Essential (primary) hypertension: Secondary | ICD-10-CM

## 2018-01-14 DIAGNOSIS — I5031 Acute diastolic (congestive) heart failure: Secondary | ICD-10-CM | POA: Diagnosis not present

## 2018-01-14 DIAGNOSIS — E890 Postprocedural hypothyroidism: Secondary | ICD-10-CM | POA: Diagnosis not present

## 2018-01-14 DIAGNOSIS — G20 Parkinson's disease: Secondary | ICD-10-CM

## 2018-01-14 DIAGNOSIS — Z0000 Encounter for general adult medical examination without abnormal findings: Secondary | ICD-10-CM | POA: Diagnosis not present

## 2018-01-14 DIAGNOSIS — I480 Paroxysmal atrial fibrillation: Secondary | ICD-10-CM | POA: Diagnosis not present

## 2018-01-14 MED ORDER — LOSARTAN POTASSIUM 100 MG PO TABS: 50.0000 mg | ORAL_TABLET | Freq: Two times a day (BID) | ORAL | 3 refills | Status: DC

## 2018-01-14 NOTE — Assessment & Plan Note (Signed)
Immunizations UTD. No colon cancer screening needed due to age. Encouraged a healthy diet with regular exercise. Exam unremarkable. Labs pending, [facility]. He will send reports. Follow up in 1 year for CPE.

## 2018-01-14 NOTE — Patient Instructions (Signed)
Please message me if you need labs drawn. If the [facility] does [facility] labs will you send me a copy?  Continue exercising. You should be getting 150 minutes of exercise weekly.  Continue to eat plenty of vegetables, fruit, whole grains, lean protein.  Ensure you are consuming 64 ounces of water daily.  I'll see you in one year for your annual exam or sooner if needed. It was a pleasure to see you today!   Preventive Care 48 Years and Older, Male Preventive care refers to lifestyle choices and visits with your health care provider that can promote health and wellness. What does preventive care include?  A yearly physical exam. This is also called an annual well check.  Dental exams once or twice a year.  Routine eye exams. Ask your health care provider how often you should have your eyes checked.  Personal lifestyle choices, including: ? Daily care of your teeth and gums. ? Regular physical activity. ? Eating a healthy diet. ? Avoiding tobacco and drug use. ? Limiting alcohol use. ? Practicing safe sex. ? Taking low doses of aspirin every day. ? Taking vitamin and mineral supplements as recommended by your health care provider. What happens during an annual well check? The services and screenings done by your health care provider during your annual well check will depend on your age, overall health, lifestyle risk factors, and family history of disease. Counseling Your health care provider may ask you questions about your:  Alcohol use.  Tobacco use.  Drug use.  Emotional well-being.  Home and relationship well-being.  Sexual activity.  Eating habits.  History of falls.  Memory and ability to understand (cognition).  Work and work Statistician.  Screening You may have the following tests or measurements:  Height, weight, and BMI.  Blood pressure.  Lipid and cholesterol levels. These may be checked every 5 years, or more frequently if you are over 57 years  old.  Skin check.  Lung cancer screening. You may have this screening every year starting at age 19 if you have a 30-pack-year history of smoking and currently smoke or have quit within the past 15 years.  Fecal occult blood test (FOBT) of the stool. You may have this test every year starting at age 10.  Flexible sigmoidoscopy or colonoscopy. You may have a sigmoidoscopy every 5 years or a colonoscopy every 10 years starting at age 56.  Prostate cancer screening. Recommendations will vary depending on your family history and other risks.  Hepatitis C blood test.  Hepatitis B blood test.  Sexually transmitted disease (STD) testing.  Diabetes screening. This is done by checking your blood sugar (glucose) after you have not eaten for a while (fasting). You may have this done every 1-3 years.  Abdominal aortic aneurysm (AAA) screening. You may need this if you are a current or former smoker.  Osteoporosis. You may be screened starting at age 24 if you are at high risk.  Talk with your health care provider about your test results, treatment options, and if necessary, the need for more tests. Vaccines Your health care provider may recommend certain vaccines, such as:  Influenza vaccine. This is recommended every year.  Tetanus, diphtheria, and acellular pertussis (Tdap, Td) vaccine. You may need a Td booster every 10 years.  Varicella vaccine. You may need this if you have not been vaccinated.  Zoster vaccine. You may need this after age 62.  Measles, mumps, and rubella (MMR) vaccine. You may need at least  one dose of MMR if you were born in 1957 or later. You may also need a second dose.  Pneumococcal 13-valent conjugate (PCV13) vaccine. One dose is recommended after age 71.  Pneumococcal polysaccharide (PPSV23) vaccine. One dose is recommended after age 24.  Meningococcal vaccine. You may need this if you have certain conditions.  Hepatitis A vaccine. You may need this if you  have certain conditions or if you travel or work in places where you may be exposed to hepatitis A.  Hepatitis B vaccine. You may need this if you have certain conditions or if you travel or work in places where you may be exposed to hepatitis B.  Haemophilus influenzae type b (Hib) vaccine. You may need this if you have certain risk factors.  Talk to your health care provider about which screenings and vaccines you need and how often you need them. This information is not intended to replace advice given to you by your health care provider. Make sure you discuss any questions you have with your health care provider. Document Released: 04/28/2015 Document Revised: 12/20/2015 Document Reviewed: 01/31/2015 Elsevier Interactive Patient Education  [employer].

## 2018-01-14 NOTE — Assessment & Plan Note (Signed)
Rate and rhythm regular, sinus bradycardia. Continue Eliquis and BP control.

## 2018-01-14 NOTE — Assessment & Plan Note (Signed)
Patient to get repeat labs drawn at the [place] in two days. He will send lab reports.

## 2018-01-14 NOTE — Assessment & Plan Note (Signed)
Overall asymptomatic, some incontinence when he can't use fine motor skills to unbutton pants.

## 2018-01-14 NOTE — Progress Notes (Signed)
Subjective:    Patient ID: William [name], male    DOB: [birth year], 84 y.o.   MRN: [id number]  HPI  William [name] is an 84 year old male who presents today for complete physical.  Immunizations: -Tetanus: Completed in 2011 -Influenza: Completed this season -Pneumonia: Completed in 2018 -Shingles: Completed in 2015  Diet: He endorses a fair diet. Breakfast: Fast food once weekly, cereal with fruit, toast, oatmeal Lunch: Sandwich, soup Dinner: Meat, vegetable, starch, restaurants once weekly  Snacks: Trail mix, nuts, cheese, peanut butter graham crackers Desserts: Cookies, ice cream. Daily. Beverages: Wine, water, unsweet tea  Exercise: He plans on starting Yoga, is exercising once weekly.  Eye exam: Completed in 2019 Dental exam: Completes regularly  BP Readings from Last 3 Encounters:  01/14/18 100/62  01/09/18 98/62  01/05/18 108/62     Review of Systems  Constitutional: Negative for unexpected weight change.  HENT: Negative for rhinorrhea.   Respiratory: Negative for cough and shortness of breath.   Cardiovascular: Negative for chest pain.  Gastrointestinal: Negative for constipation and diarrhea.  Genitourinary: Negative for difficulty urinating.  Musculoskeletal: Negative for myalgias.  Skin: Negative for rash.  Allergic/Immunologic: Negative for environmental allergies.  Neurological: Negative for dizziness, numbness and headaches.  Psychiatric/Behavioral: The patient is not nervous/anxious.        Past Medical History:  Diagnosis Date  . A-fib ([place])   . Arthritis    "right knee" (08/09/2014)  . Basal cell carcinoma   . Benign prostatic hypertrophy with urinary obstruction   . Chronic anticoagulation 12/04/2016  . Complication of anesthesia    "difficulty intubation, had to use fiberoptic 2006"  . Difficult intubation   . DJD (degenerative joint disease)   . Gout   . Hx of colonic polyps   . Hyperlipidemia   . Hypertension    sees [name]   . Hypothyroidism   . Kidney tumor    "tumor on cyst on kidney"   . 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 Mini stroke ([place])   . Parkinson disease ([place])   . Pneumonia    hx of in 1952  . Primary skin squamous cell carcinoma   . Subdural hematoma ([place]) 2006  . TIA (transient ischemic attack)    pt does not recall this hx "but may have had one today" (08/09/2014)     Social History   Socioeconomic History  . Marital status: Widowed    Spouse name: Not on file  . Number of children: 3  . Years of education: 12  . Highest education level: Not on file  Occupational History  . Occupation: retired  Scientific laboratory technician  . Financial resource strain: Not on file  . Food insecurity:    Worry: Not on file    Inability: Not on file  . Transportation needs:    Medical: Not on file    Non-medical: Not on file  Tobacco Use  . Smoking status: Former Smoker    Packs/day: 0.80    Years: 15.00    Pack years: 12.00    Types: Cigarettes    Last attempt to quit: 04/15/1965    Years since quitting: 52.7  . Smokeless tobacco: Never Used  Substance and Sexual Activity  . Alcohol use: Yes    Alcohol/week: 5.0 standard drinks    Types: 5 Glasses of wine per week    Comment: 1 glass of wine every night (6oz glass)  . Drug use:  No  . Sexual activity: Yes  Lifestyle  . Physical activity:    Days per week: Not on file    Minutes per session: Not on file  . Stress: Not on file  Relationships  . Social connections:    Talks on phone: Not on file    Gets together: Not on file    Attends religious service: Not on file    Active member of club or organization: Not on file    Attends meetings of clubs or organizations: Not on file    Relationship status: Not on file  . Intimate partner violence:    Fear of current or ex partner: Not on file    Emotionally abused: Not on file    Physically abused: Not on file    Forced sexual activity: Not on file  Other Topics Concern   . Not on file  Social History Narrative   Married.   Lives at home with significant other.   Retried. 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   Enjoys golfing, yard work, spending time with family.      Past Surgical History:  Procedure Laterality Date  . APPENDECTOMY    . BACK SURGERY    . CATARACT EXTRACTION W/ INTRAOCULAR LENS  IMPLANT, BILATERAL Bilateral ~ 1998  . CHOLECYSTECTOMY    . CRYOABLATION  07-10-12   "tumor on cyst on my kidney; had an ablation"  . EYE SURGERY    . INGUINAL HERNIA REPAIR Right   . IR GENERIC HISTORICAL  11/09/2015   IR RADIOLOGIST EVAL & MGMT 11/09/2015 [name], MD [facility] INTERV RAD  . IR RADIOLOGIST EVAL & MGMT  08/13/2016  . KIDNEY SURGERY    . KNEE ARTHROSCOPY Right   . LEFT HEART CATH AND CORONARY ANGIOGRAPHY N/A 12/05/2016   Procedure: LEFT HEART CATH AND CORONARY ANGIOGRAPHY;  Surgeon: [name], [name], MD;  Location: [place] [facility];  Service: Cardiovascular;  Laterality: N/A;  . LUMBAR LAMINECTOMY/DECOMPRESSION MICRODISCECTOMY  05/14/2012   Procedure: LUMBAR LAMINECTOMY/DECOMPRESSION MICRODISCECTOMY 1 LEVEL;  Surgeon: [name], MD;  Location: [place] [facility];  Service: Neurosurgery;  Laterality: Left;  Left Lumbar four-five Laminectomy/Diskectomy/Far lateral diskectomy  . SKIN CANCER EXCISION  "several"   "back of my neck; right eye; clavicle; nose"  . SUBDURAL HEMATOMA EVACUATION VIA CRANIOTOMY  2006  . THYROIDECTOMY     medullary carcinoma  . TONSILLECTOMY      Family History  Problem Relation Age of Onset  . Stroke Mother   . Stroke Father   . Heart disease Father   . Alzheimer's disease Brother   . Alzheimer's disease Sister     No Known Allergies  Current Outpatient Medications on File Prior to Visit  Medication Sig Dispense Refill  . amLODipine (NORVASC) 2.5 MG tablet Take 1 tablet (2.5 mg total) by mouth daily. To take with amlodipine 5mg  for total of 7.5mg  daily 90 tablet 3  . amLODipine (NORVASC) 5 MG tablet Take 1  tablet (5 mg total) by mouth daily. To take with 2.5 mg to make 7.5 mg daily 90 tablet 3  . apixaban (ELIQUIS) 2.5 MG TABS tablet Take 1 tablet (2.5 mg total) by mouth 2 (two) times daily. 60 tablet 4  . carbidopa-levodopa (SINEMET IR) 25-100 MG tablet Take 1.5 tablets by mouth 4 (four) times daily. At 8, 12, 4 PM, and 8 PM 540 tablet 3  . hydrALAZINE (APRESOLINE) 10 MG tablet Take 1 tablet by mouth up to three times  daily for systolic blood pressure readings above 170. 90 tablet 0  . levothyroxine (SYNTHROID, LEVOTHROID) 100 MCG tablet TAKE 1 TABLET BY MOUTH ON AN EMPTY  STOMACH WITH A FULL GLASS  OF WATER DAILY 90 tablet 0  . Multiple Vitamin (MULTIVITAMIN WITH MINERALS) TABS Take 1 tablet by mouth daily. Reported on 08/01/2015    . nitroGLYCERIN (NITROSTAT) 0.4 MG SL tablet Place 1 tablet (0.4 mg total) under the tongue every 5 (five) minutes x 3 doses as needed for chest pain. 25 tablet 12  . Probiotic Product (ALIGN) 4 MG CAPS Take 1 capsule by mouth daily.    . rasagiline (AZILECT) 1 MG TABS tablet Take 1 tablet (1 mg total) by mouth daily. 90 tablet 3  . simvastatin (ZOCOR) 40 MG tablet Take 40 mg by mouth daily.     No current facility-administered medications on file prior to visit.     BP 100/62   Pulse (!) 55   Temp 98 F (36.7 C) (Oral)   Ht 6\' 3"  (1.905 m)   Wt 180 lb (81.6 kg)   SpO2 97%   BMI 22.50 kg/m    Objective:   Physical Exam  Constitutional: He is oriented to person, place, and time. He appears well-nourished.  HENT:  Mouth/Throat: No oropharyngeal exudate.  Eyes: Pupils are equal, round, and reactive to light. EOM are normal.  Neck: Neck supple. No thyromegaly present.  Cardiovascular: Normal rate and regular rhythm.  Respiratory: Effort normal and breath sounds normal.  GI: Soft. Bowel sounds are normal. There is no tenderness.  Musculoskeletal: Normal range of motion.  Neurological: He is alert and oriented to person, place, and time.  Skin: Skin is warm  and dry.  Psychiatric: He has a normal mood and affect.           Assessment & Plan:

## 2018-01-14 NOTE — Assessment & Plan Note (Signed)
Appears euvolemic today. Asymptomatic.  

## 2018-01-14 NOTE — Assessment & Plan Note (Signed)
Overall doing well, plans on starting yoga soon. Appears comfortable in the office today. Following with neurology. Continue current regimen.

## 2018-01-14 NOTE — Assessment & Plan Note (Signed)
Recent repeat TSH stable, continue levothyroxine 100 mcg daily.

## 2018-01-14 NOTE — Assessment & Plan Note (Signed)
Asymptomatic. Continue statin and BP control.

## 2018-01-14 NOTE — Assessment & Plan Note (Signed)
Stable today, history of labile readings. Continue current regimen per cardiology.

## 2018-01-14 NOTE — Assessment & Plan Note (Signed)
Repeat screening in May 2019 and is unchanged from prior screening. Continue to monitor annually.

## 2018-01-14 NOTE — Assessment & Plan Note (Signed)
Repeat lipids pending for [facility] visit in two days ago.

## 2018-01-19 ENCOUNTER — Encounter: Payer: Self-pay | Admitting: Primary Care

## 2018-02-05 DIAGNOSIS — Z961 Presence of intraocular lens: Secondary | ICD-10-CM | POA: Diagnosis not present

## 2018-02-05 DIAGNOSIS — H524 Presbyopia: Secondary | ICD-10-CM | POA: Diagnosis not present

## 2018-02-05 DIAGNOSIS — H52203 Unspecified astigmatism, bilateral: Secondary | ICD-10-CM | POA: Diagnosis not present

## 2018-02-05 DIAGNOSIS — H5213 Myopia, bilateral: Secondary | ICD-10-CM | POA: Diagnosis not present

## 2018-02-10 DIAGNOSIS — C44529 Squamous cell carcinoma of skin of other part of trunk: Secondary | ICD-10-CM | POA: Diagnosis not present

## 2018-02-10 DIAGNOSIS — Z85828 Personal history of other malignant neoplasm of skin: Secondary | ICD-10-CM | POA: Diagnosis not present

## 2018-02-10 DIAGNOSIS — L989 Disorder of the skin and subcutaneous tissue, unspecified: Secondary | ICD-10-CM | POA: Diagnosis not present

## 2018-02-10 DIAGNOSIS — D485 Neoplasm of uncertain behavior of skin: Secondary | ICD-10-CM | POA: Diagnosis not present

## 2018-02-10 DIAGNOSIS — C44329 Squamous cell carcinoma of skin of other parts of face: Secondary | ICD-10-CM | POA: Diagnosis not present

## 2018-02-10 DIAGNOSIS — L819 Disorder of pigmentation, unspecified: Secondary | ICD-10-CM | POA: Diagnosis not present

## 2018-02-10 DIAGNOSIS — L570 Actinic keratosis: Secondary | ICD-10-CM | POA: Diagnosis not present

## 2018-02-20 NOTE — Telephone Encounter (Signed)
Pt was seen on 01/14/18 with Gentry Fitz NP for annual.

## 2018-02-22 NOTE — Progress Notes (Signed)
Cardiology Office Note   Date:  02/23/2018   ID:  William [name], DOB February 07, 1929, MRN [id number]  PCP:  Pleas Koch, NP  Cardiologist:   Minus Breeding, MD     Chief Complaint  Patient presents with  . Atrial Fibrillation      History of Present Illness: William [name] is a 84 y.o. male who presents for evaluation of atrial fibrillation.  He has had paroxysmal atrial fibrillation. He's had a TIA as well.  He was admitted in August of last year with hypertension and elevated troponin.  Cardiac catheterization demonstrated 99% septal perforator and 50% LAD stenosis.  His ejection fraction was well preserved.  He did have an elevated EDP.  He was managed medically.  We have seen him for med titration and has had some labile blood pressures.  This has been exacerbated by his [name] and dysautonomia.   He presents for follow up.    He says he is doing very well.  He is very pleased with his blood pressure.  He is taking with our Hypertension Clinic.  He says his blood pressures at best it has been in years.  He does not have any orthostatic symptoms.  He denies any presyncope or syncope.  He said no chest pressure, neck or arm discomfort.  He had no palpitations or shortness of breath.  He is having a melanoma and squamous cell cancer removed from his back.  They are getting ready to move to [place].  Past Medical History:  Diagnosis Date  . A-fib ([place])   . Arthritis    "right knee" (08/09/2014)  . Basal cell carcinoma   . Benign prostatic hypertrophy with urinary obstruction   . Chronic anticoagulation 12/04/2016  . Complication of anesthesia    "difficulty intubation, had to use fiberoptic 2006"  . Difficult intubation   . DJD (degenerative joint disease)   . Gout   . Hx of colonic polyps   . Hyperlipidemia   . Hypertension    sees [name]  . Hypothyroidism   . Kidney tumor    "tumor on cyst on kidney"   . Malignant neoplasm of  thyroid gland (HCC)    medullary carcinoma  . Mini stroke ([place])   . Parkinson disease ([place])   . Pneumonia    hx of in 1952  . Primary skin squamous cell carcinoma   . Subdural hematoma ([place]) 2006  . TIA (transient ischemic attack)    pt does not recall this hx "but may have had one today" (08/09/2014)    Past Surgical History:  Procedure Laterality Date  . APPENDECTOMY    . BACK SURGERY    . CATARACT EXTRACTION W/ INTRAOCULAR LENS  IMPLANT, BILATERAL Bilateral ~ 1998  . CHOLECYSTECTOMY    . CRYOABLATION  07-10-12   "tumor on cyst on my kidney; had an ablation"  . EYE SURGERY    . INGUINAL HERNIA REPAIR Right   . IR GENERIC HISTORICAL  11/09/2015   IR RADIOLOGIST EVAL & MGMT 11/09/2015 [name], MD [facility] INTERV RAD  . IR RADIOLOGIST EVAL & MGMT  08/13/2016  . KIDNEY SURGERY    . KNEE ARTHROSCOPY Right   . LEFT HEART CATH AND CORONARY ANGIOGRAPHY N/A 12/05/2016   Procedure: LEFT HEART CATH AND CORONARY ANGIOGRAPHY;  Surgeon: [name], [name], MD;  Location: [place] [facility];  Service: Cardiovascular;  Laterality: N/A;  . LUMBAR LAMINECTOMY/DECOMPRESSION MICRODISCECTOMY  05/14/2012   Procedure:  LUMBAR LAMINECTOMY/DECOMPRESSION MICRODISCECTOMY 1 LEVEL;  Surgeon: [name], MD;  Location: [place] [facility];  Service: Neurosurgery;  Laterality: Left;  Left Lumbar four-five Laminectomy/Diskectomy/Far lateral diskectomy  . SKIN CANCER EXCISION  "several"   "back of my neck; right eye; clavicle; nose"  . SUBDURAL HEMATOMA EVACUATION VIA CRANIOTOMY  2006  . THYROIDECTOMY     medullary carcinoma  . TONSILLECTOMY       Current Outpatient Medications  Medication Sig Dispense Refill  . amLODipine (NORVASC) 2.5 MG tablet Take 1 tablet (2.5 mg total) by mouth daily. To take with amlodipine 5mg  for total of 7.5mg  daily 90 tablet 3  . amLODipine (NORVASC) 5 MG tablet Take 1 tablet (5 mg total) by mouth daily. To take with 2.5 mg to make 7.5 mg daily 90 tablet 3  . apixaban (ELIQUIS) 2.5 MG  TABS tablet Take 1 tablet (2.5 mg total) by mouth 2 (two) times daily. 60 tablet 4  . carbidopa-levodopa (SINEMET IR) 25-100 MG tablet Take 1.5 tablets by mouth 4 (four) times daily. At 8, 12, 4 PM, and 8 PM 540 tablet 3  . hydrALAZINE (APRESOLINE) 10 MG tablet Take 1 tablet by mouth up to three times daily for systolic blood pressure readings above 170. 90 tablet 0  . levothyroxine (SYNTHROID, LEVOTHROID) 100 MCG tablet TAKE 1 TABLET BY MOUTH ON AN EMPTY  STOMACH WITH A FULL GLASS  OF WATER DAILY 90 tablet 0  . losartan (COZAAR) 100 MG tablet Take 50 mg by mouth daily at 2 PM.    . Multiple Vitamin (MULTIVITAMIN WITH MINERALS) TABS Take 1 tablet by mouth daily. Reported on 08/01/2015    . nitroGLYCERIN (NITROSTAT) 0.4 MG SL tablet Place 1 tablet (0.4 mg total) under the tongue every 5 (five) minutes x 3 doses as needed for chest pain. 25 tablet 12  . Probiotic Product (ALIGN) 4 MG CAPS Take 1 capsule by mouth daily.    . rasagiline (AZILECT) 1 MG TABS tablet Take 1 tablet (1 mg total) by mouth daily. 90 tablet 3  . simvastatin (ZOCOR) 40 MG tablet Take 40 mg by mouth daily.     No current facility-administered medications for this visit.     Allergies:   Patient has no known allergies.    ROS:  Please see the history of present illness.   Otherwise, review of systems are positive for none.   All other systems are reviewed and negative.    PHYSICAL EXAM: VS:  BP 120/82   Pulse (!) 53   Ht 6\' 3"  (1.905 m)   Wt 180 lb 3.2 oz (81.7 kg)   BMI 22.52 kg/m  , BMI Body mass index is 22.52 kg/m.  GENERAL:  Well appearing for his age.   NECK:  No jugular venous distention, waveform within normal limits, carotid upstroke brisk and symmetric, no bruits, no thyromegaly LUNGS:  Clear to auscultation bilaterally CHEST:  Unremarkable HEART:  PMI not displaced or sustained,S1 and S2 within normal limits, no S3, no S4, no clicks, no rubs, no murmurs ABD:  Flat, positive bowel sounds normal in frequency  in pitch, no bruits, no rebound, no guarding, no midline pulsatile mass, no hepatomegaly, no splenomegaly EXT:  2 plus pulses throughout, no edema, no cyanosis no clubbing NEURO:  Resting tremor.   EKG: Sinus bradycardia, rate 53, axis within normal limits, right bundle branch block, no change from previous. 02/23/2018  Recent Labs: 01/09/2018: TSH 2.54    Lipid Panel    Component  Value Date/Time   CHOL 116 12/05/2016 0207   TRIG 139 12/05/2016 0207   TRIG 101 03/25/2006 0935   HDL 46 12/05/2016 0207   CHOLHDL 2.5 12/05/2016 0207   VLDL 28 12/05/2016 0207   LDLCALC 42 12/05/2016 0207      Wt Readings from Last 3 Encounters:  02/23/18 180 lb 3.2 oz (81.7 kg)  01/14/18 180 lb (81.6 kg)  01/09/18 180 lb 4 oz (81.8 kg)      Other studies Reviewed: Additional studies/ records that were reviewed today include:    Labs Review of the above records demonstrates:   See elsewhere.     ASSESSMENT AND PLAN:  ATRIAL FIB:  The patient has a CHA2DS2 - VASc score of 5 with a risk of stroke of 6.7%. He tolerates anticoagulation.  No change in therapy.   THORACIC AORTIC ENLARGEMENT:    This was 4.4 cm on CT in Dec.  We had a long discussion today.  We talked about quality of life versus quality of life and whether he would ever want to have open chest surgical repair of his aorta.  He does not think he would ever want this.  And therefore we have agreed that we will not follow-up his aorta with a CT angiogram.  HTN:  The blood pressure is finally at target and less labile.  Continue the current therapies.  He has rarely had to use PRN hydralazine.  CAD:   The patient has no new symptoms.  No change in therapy.  CHRONIC DIASTOLIC HF: He seems to be euvolemic.  Euvolemic.  No change in therapy.  No change in therapy.   CKD II: Creatinine is slightly elevated at 1.5.  He will continue the meds as listed.   Current medicines are reviewed at length with the patient today.  The patient  does not have concerns regarding medicines.  The following changes have been made:    Labs/ tests ordered today include:   Orders Placed This Encounter  Procedures  . CBC  . Basic Metabolic Panel (BMET)  . EKG 12-Lead     Disposition:   FU with me in 12 months.    Signed, Minus Breeding, MD  02/23/2018 12:20 PM    [facility]

## 2018-02-23 ENCOUNTER — Encounter: Payer: Self-pay | Admitting: Cardiology

## 2018-02-23 ENCOUNTER — Ambulatory Visit: Payer: Medicare HMO | Admitting: Cardiology

## 2018-02-23 VITALS — BP 120/82 | HR 53 | Ht 75.0 in | Wt 180.2 lb

## 2018-02-23 DIAGNOSIS — I2510 Atherosclerotic heart disease of native coronary artery without angina pectoris: Secondary | ICD-10-CM

## 2018-02-23 DIAGNOSIS — I712 Thoracic aortic aneurysm, without rupture, unspecified: Secondary | ICD-10-CM

## 2018-02-23 DIAGNOSIS — I480 Paroxysmal atrial fibrillation: Secondary | ICD-10-CM

## 2018-02-23 DIAGNOSIS — I5032 Chronic diastolic (congestive) heart failure: Secondary | ICD-10-CM

## 2018-02-23 DIAGNOSIS — N182 Chronic kidney disease, stage 2 (mild): Secondary | ICD-10-CM | POA: Diagnosis not present

## 2018-02-23 LAB — CBC
HEMATOCRIT: 44 % (ref 37.5–51.0)
Hemoglobin: 14.4 g/dL (ref 13.0–17.7)
MCH: 29.2 pg (ref 26.6–33.0)
MCHC: 32.7 g/dL (ref 31.5–35.7)
MCV: 89 fL (ref 79–97)
Platelets: 174 10*3/uL (ref 150–450)
RBC: 4.93 x10E6/uL (ref 4.14–5.80)
RDW: 12.9 % (ref 12.3–15.4)
WBC: 5.1 10*3/uL (ref 3.4–10.8)

## 2018-02-23 LAB — BASIC METABOLIC PANEL
BUN/Creatinine Ratio: 13 (ref 10–24)
BUN: 17 mg/dL (ref 8–27)
CO2: 28 mmol/L (ref 20–29)
Calcium: 8.6 mg/dL (ref 8.6–10.2)
Chloride: 101 mmol/L (ref 96–106)
Creatinine, Ser: 1.33 mg/dL — ABNORMAL HIGH (ref 0.76–1.27)
GFR calc Af Amer: 55 mL/min/{1.73_m2} — ABNORMAL LOW (ref >59)
GFR, EST NON AFRICAN AMERICAN: 48 mL/min/{1.73_m2} — AB (ref >59)
GLUCOSE: 98 mg/dL (ref 65–99)
POTASSIUM: 4.2 mmol/L (ref 3.5–5.2)
SODIUM: 145 mmol/L — AB (ref 134–144)

## 2018-02-23 NOTE — Patient Instructions (Signed)
Medication Instructions:  Continue current medications  If you need a refill on your cardiac medications before your next appointment, please call your pharmacy.  Labwork: CBC and BMP Today HERE IN OUR OFFICE AT [facility]  Take the provided lab slips with you to the lab for your blood draw.   You will NOT need to fast   If you have labs (blood work) drawn today and your tests are completely normal, you will receive your results only by: Marland Kitchen MyChart Message (if you have MyChart) OR . A paper copy in the mail If you have any lab test that is abnormal or we need to change your treatment, we will call you to review the results.  Testing/Procedures: None Ordered  Follow-Up: You will need a follow up appointment in 1 Year.  Please call our office 2 months in advance([phone]) to schedule the (1 Year) appointment.  You may see  DR Percival Spanish, or one of the following Advanced Practice Providers on your designated Care Team:    . Jory Sims, DNP, ANP . Rhonda Barrett, PA-C  . Kerin Ransom, [place]  . Almyra Deforest, PA-C . Fabian Sharp, PA-C  At [facility], you and your health needs are our priority.  As part of our continuing mission to provide you with exceptional heart care, we have created designated Provider Care Teams.  These Care Teams include your primary Cardiologist (physician) and Advanced Practice Providers (APPs -  Physician Assistants and Nurse Practitioners) who all work together to provide you with the care you need, when you need it.   Thank you for choosing [facility] at [facility]!!

## 2018-02-24 DIAGNOSIS — C44329 Squamous cell carcinoma of skin of other parts of face: Secondary | ICD-10-CM | POA: Diagnosis not present

## 2018-03-03 ENCOUNTER — Other Ambulatory Visit: Payer: Self-pay | Admitting: Cardiology

## 2018-03-03 DIAGNOSIS — E7849 Other hyperlipidemia: Secondary | ICD-10-CM

## 2018-03-03 DIAGNOSIS — L905 Scar conditions and fibrosis of skin: Secondary | ICD-10-CM | POA: Diagnosis not present

## 2018-03-03 DIAGNOSIS — D0359 Melanoma in situ of other part of trunk: Secondary | ICD-10-CM | POA: Diagnosis not present

## 2018-03-17 DIAGNOSIS — D485 Neoplasm of uncertain behavior of skin: Secondary | ICD-10-CM | POA: Diagnosis not present

## 2018-03-17 DIAGNOSIS — C44319 Basal cell carcinoma of skin of other parts of face: Secondary | ICD-10-CM | POA: Diagnosis not present

## 2018-03-17 DIAGNOSIS — L570 Actinic keratosis: Secondary | ICD-10-CM | POA: Diagnosis not present

## 2018-04-01 DIAGNOSIS — L570 Actinic keratosis: Secondary | ICD-10-CM | POA: Diagnosis not present

## 2018-04-09 DIAGNOSIS — E039 Hypothyroidism, unspecified: Secondary | ICD-10-CM

## 2018-04-10 MED ORDER — LEVOTHYROXINE SODIUM 100 MCG PO TABS: ORAL_TABLET | ORAL | 1 refills | Status: DC

## 2018-04-23 ENCOUNTER — Telehealth: Payer: Self-pay | Admitting: Primary Care

## 2018-04-23 NOTE — Telephone Encounter (Signed)
Forms received for William [name]. We need to print off his last physical exam from October 2019. We also need to attach current medication list, past medical history, past surgical history, CBC, CMP, UA.   He may have to come in to provide a UA as we do not have a current one on file. He also needs a TB skin test 6 weeks prior to move in.  Form placed in Chan's inbox.

## 2018-04-27 DIAGNOSIS — I10 Essential (primary) hypertension: Secondary | ICD-10-CM | POA: Diagnosis not present

## 2018-04-27 DIAGNOSIS — I2510 Atherosclerotic heart disease of native coronary artery without angina pectoris: Secondary | ICD-10-CM | POA: Diagnosis not present

## 2018-04-27 DIAGNOSIS — Z7722 Contact with and (suspected) exposure to environmental tobacco smoke (acute) (chronic): Secondary | ICD-10-CM | POA: Diagnosis not present

## 2018-04-27 DIAGNOSIS — Z7901 Long term (current) use of anticoagulants: Secondary | ICD-10-CM | POA: Diagnosis not present

## 2018-04-27 DIAGNOSIS — R32 Unspecified urinary incontinence: Secondary | ICD-10-CM | POA: Diagnosis not present

## 2018-04-27 DIAGNOSIS — I4891 Unspecified atrial fibrillation: Secondary | ICD-10-CM | POA: Diagnosis not present

## 2018-04-27 DIAGNOSIS — E039 Hypothyroidism, unspecified: Secondary | ICD-10-CM | POA: Diagnosis not present

## 2018-04-27 DIAGNOSIS — G20 Parkinson's disease: Secondary | ICD-10-CM | POA: Diagnosis not present

## 2018-04-27 DIAGNOSIS — I252 Old myocardial infarction: Secondary | ICD-10-CM | POA: Diagnosis not present

## 2018-04-27 DIAGNOSIS — E785 Hyperlipidemia, unspecified: Secondary | ICD-10-CM | POA: Diagnosis not present

## 2018-04-28 ENCOUNTER — Ambulatory Visit (INDEPENDENT_AMBULATORY_CARE_PROVIDER_SITE_OTHER): Payer: Medicare HMO | Admitting: *Deleted

## 2018-04-28 DIAGNOSIS — Z111 Encounter for screening for respiratory tuberculosis: Secondary | ICD-10-CM | POA: Diagnosis not present

## 2018-04-28 NOTE — Progress Notes (Signed)
Per orders of Alma Friendly, NP, TB skin test placed by Tammi Sou. Patient tolerated injection well.

## 2018-04-28 NOTE — Telephone Encounter (Signed)
TB skin test placed and OV and other info placed with forms, pt will come back on Thursday to get TB read and leave UA then, pt couldn't leave a sample at nurse visit today

## 2018-04-30 ENCOUNTER — Other Ambulatory Visit (INDEPENDENT_AMBULATORY_CARE_PROVIDER_SITE_OTHER): Payer: Medicare HMO

## 2018-04-30 DIAGNOSIS — R350 Frequency of micturition: Secondary | ICD-10-CM

## 2018-04-30 LAB — POCT URINALYSIS DIPSTICK
Bilirubin, UA: NEGATIVE
Glucose, UA: NEGATIVE
KETONES UA: NEGATIVE
LEUKOCYTES UA: NEGATIVE
NITRITE UA: NEGATIVE
PROTEIN UA: NEGATIVE
RBC UA: NEGATIVE
SPEC GRAV UA: 1.01 (ref 1.010–1.025)
Urobilinogen, UA: 0.2 E.U./dL
pH, UA: 7 (ref 5.0–8.0)

## 2018-04-30 LAB — TB SKIN TEST: TB SKIN TEST: NEGATIVE

## 2018-05-07 ENCOUNTER — Ambulatory Visit: Payer: Medicare HMO | Admitting: Neurology

## 2018-05-07 ENCOUNTER — Encounter: Payer: Self-pay | Admitting: Neurology

## 2018-05-07 VITALS — BP 110/68 | HR 64 | Ht 75.0 in | Wt 182.0 lb

## 2018-05-07 DIAGNOSIS — G20 Parkinson's disease: Secondary | ICD-10-CM | POA: Diagnosis not present

## 2018-05-07 NOTE — Progress Notes (Signed)
Subjective:    Patient ID: William [name] is a 85 y.o. male.  HPI     Interim history:   William [name] is an 85 year old right-handed gentleman with an underlying medical history of right TIA in January 2003, SDH (s/p left craniotomy in August 2006), hypertension, hypothyroidism, thyroid cancer, partial onset seizures (off Dilantin), lumbar spine disease, status post lower back surgery at L4-5 in January 2014, renal tumor, status post kidney tumor ablative surgery in March 2014, who presents for followup consultation of his right-sided predominant [name]. He is accompanied by William [name] again today. I last saw him on 01/05/2018, at which time he reported a recent fall. He scraped his left elbow. He has had some right-sided knee pain and felt like his knee had given out. Otherwise, he was doing rather well. We mutually agreed to keep his medication regimen the same. She was advised to add some more strength training to his exercise regimen and stay well-hydrated.  Today, 05/07/2018 (all dictated new, as well as above notes, some dictation done in note pad or Word, outside of chart, may appear as copied):    He reports no recent falls, doing well, meds through the [place], not always exercising on a regular basis, they are hoping to move in April, will have more closer access to the gym and the pool, has access now, but has to drive there.   The patient's allergies, current medications, family history, past medical history, past social history, past surgical history and problem list were reviewed and updated as appropriate.    Previously (copied from previous notes for reference):   I saw him on 09/02/2017, at which time he was quite stable motor-wise, he had some stressors including regarding William [name]' health and they were trying to consolidate their households with a plan to move into a retirement community condo by next year. He was advised to continue with generic Azilect once daily and  Sinemet generic 1-1/2 pills 4 times a day. He was encouraged to hydrate better with water.   I saw him on 05/05/2017, at which time he reported doing quite well, no recent falls or recent illness. They were planning to move into a retirement community by the end of this year. He was trying to exercise regularly. Blood pressure was stable. He had seen pulmonology for a lung nodule which was stable. I suggested we continue with his current medication regimen.   I saw him on 12/31/2016, at which time he reported intermittent lightheadedness. He had some blood pressure fluctuations with really high values at times. He was hospitalized in August 2018 for a non-STEMI. He had significant orthostatic hypotension during the office visit. He was advised to talk to his providers about reducing his blood pressure medication. He was advised to not continue with the boxing classes for [name] but exercise within his limitations at home. He was advised to try melatonin at night for sleep issues.    I saw him on 06/27/2016, at which time he reported doing well, he had a trip to [place] in [place] planned for springtime. He had no recent falls. He was supposed to be on a smaller dose of Eliquis, namely 2.5 mg twice a day. He continued to participate in the boxing class for [name] and felt that they were helpful.   I saw him on 03/05/2016, at which time he reported doing okay. He was during his stay active, was participating in [place] steady boxing, which he felt was helpful. He  was avoiding strenuous parts of the exercise. He was interested in participating in a new drug study at [facility] [address]. This was to investigate apomorphine sublingual to decrease off time. He was on Azilect once daily. He was taking Sinemet 4 times a day. He was trying to hydrate well, reported no recent falls. I suggested he increase the Sinemet to 1-1/2 pills 4 times a day. I suggested he continue with generic Azilect once  daily.   I saw him on 10/26/2015, at which time he reported doing quite well, had some stumbling episodes, no actual falls, was trying to drink enough water and was still physically active. He had signed up for rock steady boxing classes but William [name] noted that he would get too exhausted.   I saw him on 06/22/2015, at which time he reported doing fairly well. He had finished outpatient OT, PT, and ST. Unfortunately, he did take a fall in the garage couple of months prior as he was coming in from the graduate, stepped backwards on the stairs and slipped off falling backwards, landed on his behind, head struck the car, he denied loss of consciousness or headache or bruise and no sequelae were reported. He was having rails installed in the garage entrance. Was still trying to stay active, playing golf. We mutually agreed to continue with Sinemet 4 times a day and Azilect once daily. We talked about fall risk and gait safety and fall precautions.   I saw him on 02/23/2015, at which time he reported doing fairly well. He was playing golf regularly. His right knee was bothering him. He had no recent falls. He was trying to hydrate well enough. He had no new issues with A. fib and no new complaints otherwise. I noted right knee swelling. He was advised to follow-up with his primary care physician for this. We mutually agreed to keep his Parkinson's medications the same.    I saw him on 11/01/2014 at which time he reported a recent diagnosis with A. fib. He was admitted to the hospital in April. I reviewed the hospital records from 07/17/2014 through 07/18/2014. He was started on Xarelto. He was then re-admitted on 08/09/14 to 08/10/14 due to altered mental status and slurring of speech and was suspected to have a TIA. He was seen by neuro in consultation and a baby aspirin was added to Xarelto. He presented back to the ER on 08/18/14. He a [place] wo contrast on  08/18/2014 , which was negative for any acute changes. He was  seen by the Neurologist in the emergency room and it was felt that a full TIA workup was not necessary at the time. He had presented with hypertension and some slurring of speech. He was switched from Xarelto to Eliquis, because the Xarelto is not on formulary at the [place].  He endorsed recent stressors, including the recent passing of his younger brother a week prior with advanced Alzheimer's disease.    He had an MRI brain and MRA head on 08/09/14: MRI HEAD: No acute intracranial process, specifically no acute ischemia. Involutional changes. Mild white matter changes suggest chronic small vessel ischemic disease. MRA HEAD: No acute large vessel occlusion or high-grade stenosis. Mid grade stenosis RIGHT P2/3 segment.    I suggested we continue with Sinemet at 4 times a day. He was advised to continue with Azilect as well. He was advised to drink more water. In the interim, we restarted outpatient physical therapy, occupational therapy and speech therapy.   I saw  him on 04/29/2014, at which time he reported doing well overall but he was more fatigued and had some excessive daytime somnolence. Memory was stable. His mood was stable. He noted some blood pressure fluctuations. Sometimes he had a dull headache and sometimes he felt lightheaded. He had some anxiety over his lady friend's health, as she had fallen and broken rib. He continued to walk regularly and played golf. He had no recent falls. I asked him to continue with his medications, Azilect once daily and Sinemet 4 times a day.   I saw him on 10/28/2013, at which time he reported doing well. In particular, had no cognitive complaints, no mood issues, and continued to play golf 3 times a week. He was driving well. I suggested an increase in his levodopa to 1 pill 4 times a day of the 25-100 milligrams strength. [facility] and had seen a [place] neurologist as well.    I saw him on 12/07/2012, at which time I felt that  he was doing well on Azilect and levodopa. He called in December 2014 with problems with blood pressure fluctuations. He was wondering if this came from the [place] but I did not think it was due to the Azilect per se. I was reluctant to take him off of it.    I first met him on 06/03/2012 and he previously followed by Dr. Morene Antu. He has had primarily right-sided symptoms with regards to his Parkinson's, diagnosed in 2010 with symptoms dating back to late 2009 or early 2010. He had briefly tried Mirapex but was taken off d/t hypotension. L spine MRI in June 2013 showed renal cysts, degenerative joint disease most prominent at L4-5. He tried acupuncture. His MRI of the lumbar spine showed abnormalities with his kidney with a cyst and a tumor and he had ablative surgery on 07/10/12 and had a FU CT done. He had lower back surgery on 05/14/12.  I saw him back on 09/03/2012 and I suggested starting Azilect. He has been tolerating it well and both he and his girlfriend felt that he did better with it in terms of dexterity and fine motor control. He had some hypotension and lightheadedness and reduced his BP medication. He has been having some issue with gout.   His Past Medical History Is Significant For: Past Medical History:  Diagnosis Date  . A-fib ([place])   . Arthritis    "right knee" (08/09/2014)  . Basal cell carcinoma   . Benign prostatic hypertrophy with urinary obstruction   . Chronic anticoagulation 12/04/2016  . Complication of anesthesia    "difficulty intubation, had to use fiberoptic 2006"  . Difficult intubation   . DJD (degenerative joint disease)   . Gout   . Hx of colonic polyps   . Hyperlipidemia   . Hypertension    sees [name]  . Hypothyroidism   . Kidney tumor    "tumor on cyst on kidney"   . 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 Mini stroke ([place])   . Parkinson disease ([place])   . Pneumonia    hx of in 1952  . Primary skin squamous cell  carcinoma   . Subdural hematoma ([place]) 2006  . TIA (transient ischemic attack)    pt does not recall this hx "but may have had one today" (08/09/2014)    His Past Surgical History Is Significant For: Past Surgical History:  Procedure  Laterality Date  . APPENDECTOMY    . BACK SURGERY    . CATARACT EXTRACTION W/ INTRAOCULAR LENS  IMPLANT, BILATERAL Bilateral ~ 1998  . CHOLECYSTECTOMY    . CRYOABLATION  07-10-12   "tumor on cyst on my kidney; had an ablation"  . EYE SURGERY    . INGUINAL HERNIA REPAIR Right   . IR GENERIC HISTORICAL  11/09/2015   IR RADIOLOGIST EVAL & MGMT 11/09/2015 [name], MD [facility] INTERV RAD  . IR RADIOLOGIST EVAL & MGMT  08/13/2016  . KIDNEY SURGERY    . KNEE ARTHROSCOPY Right   . LEFT HEART CATH AND CORONARY ANGIOGRAPHY N/A 12/05/2016   Procedure: LEFT HEART CATH AND CORONARY ANGIOGRAPHY;  Surgeon: [name], [name], MD;  Location: [facility];  Service: Cardiovascular;  Laterality: N/A;  . LUMBAR LAMINECTOMY/DECOMPRESSION MICRODISCECTOMY  05/14/2012   Procedure: LUMBAR LAMINECTOMY/DECOMPRESSION MICRODISCECTOMY 1 LEVEL;  Surgeon: [name], MD;  Location: [facility];  Service: Neurosurgery;  Laterality: Left;  Left Lumbar four-five Laminectomy/Diskectomy/Far lateral diskectomy  . SKIN CANCER EXCISION  "several"   "back of my neck; right eye; clavicle; nose"  . SUBDURAL HEMATOMA EVACUATION VIA CRANIOTOMY  2006  . THYROIDECTOMY     medullary carcinoma  . TONSILLECTOMY      His Family History Is Significant For: Family History  Problem Relation Age of Onset  . Stroke Mother   . Stroke Father   . Heart disease Father   . Alzheimer's disease Brother   . Alzheimer's disease Sister     His Social History Is Significant For: Social History   Socioeconomic History  . Marital status: Widowed    Spouse name: Not on file  . Number of children: 3  . Years of education: 32  . Highest education level: Not on file  Occupational History  .  Occupation: retired  Scientific laboratory technician  . Financial resource strain: Not on file  . Food insecurity:    Worry: Not on file    Inability: Not on file  . Transportation needs:    Medical: Not on file    Non-medical: Not on file  Tobacco Use  . Smoking status: Former Smoker    Packs/day: 0.80    Years: 15.00    Pack years: 12.00    Types: Cigarettes    Last attempt to quit: 04/15/1965    Years since quitting: 53.0  . Smokeless tobacco: Never Used  Substance and Sexual Activity  . Alcohol use: Yes    Alcohol/week: 5.0 standard drinks    Types: 5 Glasses of wine per week    Comment: 1 glass of wine every night (6oz glass)  . Drug use: No  . Sexual activity: Yes  Lifestyle  . Physical activity:    Days per week: Not on file    Minutes per session: Not on file  . Stress: Not on file  Relationships  . Social connections:    Talks on phone: Not on file    Gets together: Not on file    Attends religious service: Not on file    Active member of club or organization: Not on file    Attends meetings of clubs or organizations: Not on file    Relationship status: Not on file  Other Topics Concern  . Not on file  Social History Narrative   Married.   Lives at home with significant other.   Retried. 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   Enjoys golfing, yard work,  spending time with family.      His Allergies Are:  No Known Allergies:   His Current Medications Are:  Outpatient Encounter Medications as of 05/07/2018  Medication Sig  . amLODipine (NORVASC) 2.5 MG tablet Take 1 tablet (2.5 mg total) by mouth daily. To take with amlodipine 57m for total of 7.532mdaily  . amLODipine (NORVASC) 5 MG tablet Take 1 tablet (5 mg total) by mouth daily. To take with 2.5 mg to make 7.5 mg daily  . apixaban (ELIQUIS) 2.5 MG TABS tablet Take 1 tablet (2.5 mg total) by mouth 2 (two) times daily.  . carbidopa-levodopa (SINEMET IR) 25-100 MG tablet Take 1.5 tablets by mouth 4 (four) times daily. At 8,  12, 4 PM, and 8 PM  . hydrALAZINE (APRESOLINE) 10 MG tablet Take 1 tablet by mouth up to three times daily for systolic blood pressure readings above 170.  . levothyroxine (SYNTHROID, LEVOTHROID) 100 MCG tablet TAKE 1 TABLET BY MOUTH ON AN EMPTY  STOMACH WITH A FULL GLASS  OF WATER DAILY  . losartan (COZAAR) 100 MG tablet Take 50 mg by mouth daily at 2 PM.  . Multiple Vitamin (MULTIVITAMIN WITH MINERALS) TABS Take 1 tablet by mouth daily. Reported on 08/01/2015  . nitroGLYCERIN (NITROSTAT) 0.4 MG SL tablet Place 1 tablet (0.4 mg total) under the tongue every 5 (five) minutes x 3 doses as needed for chest pain.  . Probiotic Product (ALIGN) 4 MG CAPS Take 1 capsule by mouth daily.  . rasagiline (AZILECT) 1 MG TABS tablet Take 1 tablet (1 mg total) by mouth daily.  . simvastatin (ZOCOR) 40 MG tablet TAKE 1 TABLET AT BEDTIME   No facility-administered encounter medications on file as of 05/07/2018.   :  Review of Systems:  Out of a complete 14 point review of systems, all are reviewed and negative with the exception of these symptoms as listed below: Review of Systems  Neurological:       Pt presents today to discuss his PD. Pt reports that he is doing well. Pt does not report any falls.    Objective:  Neurological Exam  Physical Exam Physical Examination:   Vitals:   05/07/18 1037  BP: 110/68  Pulse: 64    General Examination: The patient is a very pleasant [age].o. male in no acute distress. He appears well-developed and well-nourished and well groomed.   HEENT:Normocephalic, atraumatic, pupils are equal, round and reactive to light and accommodation. Extraocular trackingshows mild saccadic breakdown. Speech is moderately hypophonic and mildly to moderatelydysarthric, but stable. Hearing is grossly intact to mildly impaired. Oropharynx exam revealsmoderatemouth dryness, tongue protrudes centrally and palate elevates symmetrically. No other abnormal findings, he has moderateto  severenuchal rigidity and decrease in passive range of motion.  Chest:Clear to auscultation without wheezing, rhonchi or crackles noted.  Heart:S1+S2+0, regular and normal without murmurs, rubs or gallops noted.   Abdomen:Soft, non-tender and non-distended with normal bowel sounds appreciated on auscultation.  Extremities:There is nopitting edema in the distal lower extremities bilaterally.   Skin: Warm and dry without trophic changes noted. Mild bruising on hands, stable, chronic.  Musculoskeletal: exam reveals no obvious joint deformities, tenderness or joint swelling or redness. Some R knee discomfort, stable.  Neurologically:  Mental status: The patient is awake, alert and oriented in all 4 spheres. Hisimmediate and remote memory, attention, language skills and fund of knowledge are appropriate. There is no evidence of aphasia, agnosia, apraxia or anomia. Speech is clear with normal prosody and enunciation.  Thought process is linear. Mood is normaland affect is normal.  Cranial nerves II - XII are as described above under HEENT exam.  Motor exam: thin muscle bulk globally, good strength for age. He has increased tone in the right more than left upper and lower extremities. He has an intermittent resting tremor in both upper extremities, right more than left, more consistent on the right, intermittent on the left.Fine motor skills are moderately impaired on the right andslightly better on theleft. He has mild intermittent dyskinesias, mostly on the right side and some cocontraction.Reflexes are 1+ throughout. He stands up with mild difficulty andpushes himself up. Posture is stable, mildly stooped for age.He walks with decreased stride length and decrease pace,decreased arm swing bilaterally, right more than left. Sensory exam is intact to light touch. Cerebellar testing shows no additional concern for dysmetria or intention tremor or gait ataxia.  Assessment and Plan:    In summary, William Trevino Broughtonis a very pleasant 85 year old malewithan underlying medical history of right TIA in January 2003, SDH (s/p left craniotomy in August 2006), hypertension, hypothyroidism, thyroid cancer, partial onset seizures (off Dilantin), lumbar spine disease, status-post lower back surgery at L4-5 in January 2014, renal tumor, status post kidney tumor ablative surgery in March 2014, who presents for followup consultation of hisadvancedright-sided predominant [name],complicated by some autonomic dysregulation, history of falls, and overall muscular deconditioning and advancing age. His blood pressure has been on the lower end, but stable, heart rate in the high 40s and lower 50s typically stable. He is currently on generic Azilect 1 mg once daily and generic Sinemet 1-1/2 pills 4 times a day. He is advised to continue his medications at the same doses and times. He gets his prescriptions from the [place], does not need any refills today. He is advised to continue with exercises regularly,and stay well hydrated, well rested. I suggested a 6 month follow-up, sooner if needed. I answered all their questions today and the patient and William [name] were in agreement. I spent 25 minutes in total face-to-face time with the patient, more than 50% of which was spent in counseling and coordination of care, reviewing test results, reviewing medication and discussing or reviewing the diagnosis of PD, its prognosis and treatment options. Pertinent laboratory and imaging test results that were available during this visit with the patient were reviewed by me and considered in my medical decision making (see chart for details).

## 2018-05-07 NOTE — Patient Instructions (Addendum)
We will keep your medication the same, Azilect 1 mg daily and the Sinemet 1.5 pills 4 times a day, both through the [place].   You are doing well, keep up the good work! Good luck with your move!

## 2018-05-19 DIAGNOSIS — R69 Illness, unspecified: Secondary | ICD-10-CM | POA: Diagnosis not present

## 2018-06-08 ENCOUNTER — Other Ambulatory Visit: Payer: Self-pay | Admitting: Cardiology

## 2018-06-08 DIAGNOSIS — I10 Essential (primary) hypertension: Secondary | ICD-10-CM

## 2018-06-10 DIAGNOSIS — C4401 Basal cell carcinoma of skin of lip: Secondary | ICD-10-CM | POA: Diagnosis not present

## 2018-06-22 DIAGNOSIS — L570 Actinic keratosis: Secondary | ICD-10-CM | POA: Diagnosis not present

## 2018-06-22 DIAGNOSIS — Z85828 Personal history of other malignant neoplasm of skin: Secondary | ICD-10-CM | POA: Diagnosis not present

## 2018-06-22 DIAGNOSIS — L578 Other skin changes due to chronic exposure to nonionizing radiation: Secondary | ICD-10-CM | POA: Diagnosis not present

## 2018-06-30 DIAGNOSIS — R69 Illness, unspecified: Secondary | ICD-10-CM | POA: Diagnosis not present

## 2018-08-04 DIAGNOSIS — R1312 Dysphagia, oropharyngeal phase: Secondary | ICD-10-CM | POA: Diagnosis not present

## 2018-08-20 ENCOUNTER — Telehealth: Payer: Self-pay | Admitting: Pharmacist

## 2018-08-20 NOTE — Telephone Encounter (Signed)
Living @ [address]  Will like to clarify appropriate time to take BP medication.  Losartan at 2pm  Amlodipine after supper/bedtime  Patient will monitor BP 2-3 times per week and call back if adjustment needed.

## 2018-09-10 ENCOUNTER — Encounter: Payer: Self-pay | Admitting: Primary Care

## 2018-09-10 ENCOUNTER — Ambulatory Visit (INDEPENDENT_AMBULATORY_CARE_PROVIDER_SITE_OTHER): Payer: Medicare HMO | Admitting: Primary Care

## 2018-09-10 ENCOUNTER — Other Ambulatory Visit: Payer: Self-pay

## 2018-09-10 DIAGNOSIS — G20 Parkinson's disease: Secondary | ICD-10-CM | POA: Diagnosis not present

## 2018-09-10 DIAGNOSIS — I10 Essential (primary) hypertension: Secondary | ICD-10-CM | POA: Diagnosis not present

## 2018-09-10 DIAGNOSIS — G20A1 Parkinson's disease without dyskinesia, without mention of fluctuations: Secondary | ICD-10-CM

## 2018-09-10 NOTE — Progress Notes (Signed)
Subjective:    Patient ID: William [name], male    DOB: 01/13/1929, 85 y.o.   MRN: [id number]  HPI  Mr. Garrelts is an 85 year old male with a history of [name], hypertension, TIA, paroxysmal atrial fibrilation, CHF, hypothyroidism who presents today with a chief complaint of feeling off balance.   Over the last several months he's noticed difficulty and feeling off balance when rising from a seated position, especially when seated in a soft/non supportive chair.  He will rock back and forth when attempting to stand before he can take successfully rise and take the first step.  He's not able to exercise as the gyms are closed from Covid-19. He recently moved to a retirement community and plans on using their pool soon.  BP Readings from Last 3 Encounters:  09/10/18 112/64  05/07/18 110/68  02/23/18 120/82   His BP is stable and is checking at home regularly, he is taking losartan 50 mg at 2 pm and amlodipine 7.5 mg at 6 pm. He's not had to use the hydralazine or his other 50 mg of losartan. He denies dizziness, chest pain, shortness of breath. He is due to see his neurologist soon.   He has a form with him today requesting a discount on a mechanical lift chair. He believes this will allow him to rise with more stability without rocking and potentially falling.   Review of Systems  Respiratory: Negative for shortness of breath.   Cardiovascular: Negative for chest pain.  Neurological: Negative for dizziness.       Unsteady gait       Past Medical History:  Diagnosis Date  . A-fib ([place])   . Arthritis    "right knee" (08/09/2014)  . Basal cell carcinoma   . Benign prostatic hypertrophy with urinary obstruction   . Chronic anticoagulation 12/04/2016  . Complication of anesthesia    "difficulty intubation, had to use fiberoptic 2006"  . Difficult intubation   . DJD (degenerative joint disease)   . Gout   . Hx of colonic polyps   . Hyperlipidemia   . Hypertension     sees [name]  . Hypothyroidism   . Kidney tumor    "tumor on cyst on kidney"   . 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 Mini stroke ([place])   . Parkinson disease ([place])   . Pneumonia    hx of in 1952  . Primary skin squamous cell carcinoma   . Subdural hematoma ([place]) 2006  . TIA (transient ischemic attack)    pt does not recall this hx "but may have had one today" (08/09/2014)     Social History   Socioeconomic History  . Marital status: Widowed    Spouse name: Not on file  . Number of children: 3  . Years of education: 1  . Highest education level: Not on file  Occupational History  . Occupation: retired  Scientific laboratory technician  . Financial resource strain: Not on file  . Food insecurity:    Worry: Not on file    Inability: Not on file  . Transportation needs:    Medical: Not on file    Non-medical: Not on file  Tobacco Use  . Smoking status: Former Smoker    Packs/day: 0.80    Years: 15.00    Pack years: 12.00    Types: Cigarettes    Last attempt to quit: 04/15/1965    Years since  quitting: 53.4  . Smokeless tobacco: Never Used  Substance and Sexual Activity  . Alcohol use: Yes    Alcohol/week: 5.0 standard drinks    Types: 5 Glasses of wine per week    Comment: 1 glass of wine every night (6oz glass)  . Drug use: No  . Sexual activity: Yes  Lifestyle  . Physical activity:    Days per week: Not on file    Minutes per session: Not on file  . Stress: Not on file  Relationships  . Social connections:    Talks on phone: Not on file    Gets together: Not on file    Attends religious service: Not on file    Active member of club or organization: Not on file    Attends meetings of clubs or organizations: Not on file    Relationship status: Not on file  . Intimate partner violence:    Fear of current or ex partner: Not on file    Emotionally abused: Not on file    Physically abused: Not on file    Forced sexual activity: Not on file   Other Topics Concern  . Not on file  Social History Narrative   Married.   Lives at home with significant other.   Retried. 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   Enjoys golfing, yard work, spending time with family.      Past Surgical History:  Procedure Laterality Date  . APPENDECTOMY    . BACK SURGERY    . CATARACT EXTRACTION W/ INTRAOCULAR LENS  IMPLANT, BILATERAL Bilateral ~ 1998  . CHOLECYSTECTOMY    . CRYOABLATION  07-10-12   "tumor on cyst on my kidney; had an ablation"  . EYE SURGERY    . INGUINAL HERNIA REPAIR Right   . IR GENERIC HISTORICAL  11/09/2015   IR RADIOLOGIST EVAL & MGMT 11/09/2015 [name], MD [facility] INTERV RAD  . IR RADIOLOGIST EVAL & MGMT  08/13/2016  . KIDNEY SURGERY    . KNEE ARTHROSCOPY Right   . LEFT HEART CATH AND CORONARY ANGIOGRAPHY N/A 12/05/2016   Procedure: LEFT HEART CATH AND CORONARY ANGIOGRAPHY;  Surgeon: [name], [name], MD;  Location: [facility];  Service: Cardiovascular;  Laterality: N/A;  . LUMBAR LAMINECTOMY/DECOMPRESSION MICRODISCECTOMY  05/14/2012   Procedure: LUMBAR LAMINECTOMY/DECOMPRESSION MICRODISCECTOMY 1 LEVEL;  Surgeon: [name], MD;  Location: [facility];  Service: Neurosurgery;  Laterality: Left;  Left Lumbar four-five Laminectomy/Diskectomy/Far lateral diskectomy  . SKIN CANCER EXCISION  "several"   "back of my neck; right eye; clavicle; nose"  . SUBDURAL HEMATOMA EVACUATION VIA CRANIOTOMY  2006  . THYROIDECTOMY     medullary carcinoma  . TONSILLECTOMY      Family History  Problem Relation Age of Onset  . Stroke Mother   . Stroke Father   . Heart disease Father   . Alzheimer's disease Brother   . Alzheimer's disease Sister     No Known Allergies  Current Outpatient Medications on File Prior to Visit  Medication Sig Dispense Refill  . amLODipine (NORVASC) 2.5 MG tablet TAKE 1 TABLET DAILY TO TAKEWITH AMLODIPINE 5MG  FOR    TOTAL OF 7.5MG  DAILY 90 tablet 2  . amLODipine (NORVASC) 5 MG tablet TAKE 1  TABLET DAILY TO TAKEWITH 2.5MG  TO MAKE 7.5MG    DAILY 90 tablet 2  . apixaban (ELIQUIS) 2.5 MG TABS tablet Take 1 tablet (2.5 mg total) by mouth 2 (two) times daily. 60 tablet 4  .  carbidopa-levodopa (SINEMET IR) 25-100 MG tablet Take 1.5 tablets by mouth 4 (four) times daily. At 8, 12, 4 PM, and 8 PM 540 tablet 3  . hydrALAZINE (APRESOLINE) 10 MG tablet Take 1 tablet by mouth up to three times daily for systolic blood pressure readings above 170. 90 tablet 0  . levothyroxine (SYNTHROID, LEVOTHROID) 100 MCG tablet TAKE 1 TABLET BY MOUTH ON AN EMPTY  STOMACH WITH A FULL GLASS  OF WATER DAILY 90 tablet 1  . losartan (COZAAR) 100 MG tablet Take 50 mg by mouth daily at 2 PM.    . Multiple Vitamin (MULTIVITAMIN WITH MINERALS) TABS Take 1 tablet by mouth daily. Reported on 08/01/2015    . nitroGLYCERIN (NITROSTAT) 0.4 MG SL tablet Place 1 tablet (0.4 mg total) under the tongue every 5 (five) minutes x 3 doses as needed for chest pain. 25 tablet 12  . Probiotic Product (ALIGN) 4 MG CAPS Take 1 capsule by mouth daily.    . rasagiline (AZILECT) 1 MG TABS tablet Take 1 tablet (1 mg total) by mouth daily. 90 tablet 3  . simvastatin (ZOCOR) 40 MG tablet TAKE 1 TABLET AT BEDTIME 90 tablet 1   No current facility-administered medications on file prior to visit.     BP 112/64   Pulse 78   Temp 97.9 F (36.6 C) (Tympanic)   Ht 6\' 3"  (1.905 m)   Wt 175 lb 8 oz (79.6 kg)   SpO2 97%   BMI 21.94 kg/m    Objective:   Physical Exam  Constitutional: He is oriented to person, place, and time. He appears well-nourished.  Cardiovascular: Normal rate and regular rhythm.  Respiratory: Effort normal and breath sounds normal.  Musculoskeletal:     Comments: He rose pretty well from our firm office chair, he did have slight instability when first standing but was able to ambulate decently.   Neurological: He is alert and oriented to person, place, and time.  Skin: Skin is dry.           Assessment & Plan:

## 2018-09-10 NOTE — Patient Instructions (Signed)
Continue to monitor your blood pressure.  Be careful when rising from a seated position.  I will complete your form as discussed.  It was a pleasure to see you today!

## 2018-09-10 NOTE — Assessment & Plan Note (Signed)
Difficulty rising from a soft seated position such as sofa, recliner, etc. He does appear to have deconditioned which is likely due to lack of gym access.   Agree that a lift chair would be a safer option and agree to complete the form as requested. We will mail his form once complete.

## 2018-09-10 NOTE — Assessment & Plan Note (Signed)
Stable in the office today, continue current regimen. 

## 2018-09-14 ENCOUNTER — Other Ambulatory Visit: Payer: Self-pay | Admitting: Cardiology

## 2018-09-14 DIAGNOSIS — E7849 Other hyperlipidemia: Secondary | ICD-10-CM

## 2018-09-17 DIAGNOSIS — M79671 Pain in right foot: Secondary | ICD-10-CM | POA: Diagnosis not present

## 2018-09-17 DIAGNOSIS — L84 Corns and callosities: Secondary | ICD-10-CM | POA: Diagnosis not present

## 2018-09-17 DIAGNOSIS — B351 Tinea unguium: Secondary | ICD-10-CM | POA: Diagnosis not present

## 2018-09-17 DIAGNOSIS — M79672 Pain in left foot: Secondary | ICD-10-CM | POA: Diagnosis not present

## 2018-09-29 ENCOUNTER — Other Ambulatory Visit: Payer: Self-pay | Admitting: Primary Care

## 2018-09-29 DIAGNOSIS — E039 Hypothyroidism, unspecified: Secondary | ICD-10-CM

## 2018-10-14 ENCOUNTER — Ambulatory Visit: Payer: Medicare HMO | Admitting: Neurology

## 2018-10-22 DIAGNOSIS — L905 Scar conditions and fibrosis of skin: Secondary | ICD-10-CM | POA: Diagnosis not present

## 2018-10-22 DIAGNOSIS — Z85828 Personal history of other malignant neoplasm of skin: Secondary | ICD-10-CM | POA: Diagnosis not present

## 2018-10-22 DIAGNOSIS — L570 Actinic keratosis: Secondary | ICD-10-CM | POA: Diagnosis not present

## 2018-10-22 DIAGNOSIS — C4442 Squamous cell carcinoma of skin of scalp and neck: Secondary | ICD-10-CM | POA: Diagnosis not present

## 2018-11-04 ENCOUNTER — Ambulatory Visit: Payer: Medicare HMO | Admitting: Neurology

## 2018-11-04 ENCOUNTER — Encounter: Payer: Self-pay | Admitting: Neurology

## 2018-11-04 ENCOUNTER — Other Ambulatory Visit: Payer: Self-pay

## 2018-11-04 VITALS — BP 92/62 | HR 45 | Ht 76.0 in | Wt 178.0 lb

## 2018-11-04 DIAGNOSIS — R498 Other voice and resonance disorders: Secondary | ICD-10-CM | POA: Diagnosis not present

## 2018-11-04 DIAGNOSIS — G20 Parkinson's disease: Secondary | ICD-10-CM | POA: Diagnosis not present

## 2018-11-04 DIAGNOSIS — H9190 Unspecified hearing loss, unspecified ear: Secondary | ICD-10-CM | POA: Diagnosis not present

## 2018-11-04 DIAGNOSIS — R5381 Other malaise: Secondary | ICD-10-CM

## 2018-11-04 MED ORDER — RASAGILINE MESYLATE 1 MG PO TABS: 1.0000 mg | ORAL_TABLET | Freq: Every day | ORAL | 3 refills | Status: DC

## 2018-11-04 MED ORDER — CARBIDOPA-LEVODOPA 25-100 MG PO TABS: 1.5000 | ORAL_TABLET | Freq: Four times a day (QID) | ORAL | 3 refills | Status: DC

## 2018-11-04 NOTE — Progress Notes (Signed)
Subjective:    Patient ID: William [name] is a 85 y.o. male.  HPI     Interim history:   Mr. Mazur is an 85 year old right-handed gentleman with an underlying medical history of right TIA in January 2003, SDH (s/p left craniotomy in August 2006), hypertension, hypothyroidism, thyroid cancer, partial onset seizures (off Dilantin), lumbar spine disease, status post lower back surgery at L4-5 in January 2014, renal tumor, status post kidney tumor ablative surgery in March 2014, who presents for followup consultation of his right-sided predominant [name]. He is accompanied by his life partner, [name] again today. I last saw him on 05/07/2018, at which time he reported feeling fairly well and stable.  No recent falls reported.  I suggested we continue with Azilect once daily and Sinemet 1-1/2 pills 4 times a day, prescriptions typically are through the [place].   Today, 11/04/2018 (all dictated new, as well as above notes, some dictation done in note pad or Word, outside of chart, may appear as copied):    He reports generally feeling stable.  He has had some spells of weakness and not feeling as good with his mobility, [name] reports that he also seemed depressed and anxious at the time.  They have moved into their retirement community condo in April 2020 successfully, they have settled in and like their new living arrangement.  They are able to eat in the dining room when they make reservations for them.  He also has access to a salt water pool if they make reservations.  They have talked to the local physical therapist through [facility] and would like to pursue PT, OT and speech therapy through them.  The program is run by Trena Platt, [place] [facility], phone number [phone], fax number [phone].  Thankfully, he has not fallen.  He tries to hydrate well.   [name] reports that when they took A vacation to the beach with the family for about a week in  June, he felt much improved.  His speech is more softer and sometimes difficult to understand.  He seems to be more hard of hearing as well.  He has not had his hearing evaluated.   The patient's allergies, current medications, family history, past medical history, past social history, past surgical history and problem list were reviewed and updated as appropriate.    Previously (copied from previous notes for reference):    I saw him on 01/05/2018, at which time he reported a recent fall. He scraped his left elbow. He has had some right-sided knee pain and felt like his knee had given out. Otherwise, he was doing rather well. We mutually agreed to keep his medication regimen the same. She was advised to add some more strength training to his exercise regimen and stay well-hydrated.    I saw him on 09/02/2017, at which time he was quite stable motor-wise, he had some stressors including regarding [name]' health and they were trying to consolidate their households with a plan to move into a retirement community condo by next year. He was advised to continue with generic Azilect once daily and Sinemet generic 1-1/2 pills 4 times a day. He was encouraged to hydrate better with water.   I saw him on 05/05/2017, at which time he reported doing quite well, no recent falls or recent illness. They were planning to move into a retirement community by the end of this year. He was trying to exercise regularly. Blood pressure was stable. He  had seen pulmonology for a lung nodule which was stable. I suggested we continue with his current medication regimen.   I saw him on 12/31/2016, at which time he reported intermittent lightheadedness. He had some blood pressure fluctuations with really high values at times. He was hospitalized in August 2018 for a non-STEMI. He had significant orthostatic hypotension during the office visit. He was advised to talk to his providers about reducing his blood pressure medication. He  was advised to not continue with the boxing classes for [name] but exercise within his limitations at home. He was advised to try melatonin at night for sleep issues.    I saw him on 06/27/2016, at which time he reported doing well, he had a trip to [place] in [place] planned for springtime. He had no recent falls. He was supposed to be on a smaller dose of Eliquis, namely 2.5 mg twice a day. He continued to participate in the boxing class for [name] and felt that they were helpful.   I saw him on 03/05/2016, at which time he reported doing okay. He was during his stay active, was participating in [place] steady boxing, which he felt was helpful. He was avoiding strenuous parts of the exercise. He was interested in participating in a new drug study at [facility]. This was to investigate apomorphine sublingual to decrease off time. He was on Azilect once daily. He was taking Sinemet 4 times a day. He was trying to hydrate well, reported no recent falls. I suggested he increase the Sinemet to 1-1/2 pills 4 times a day. I suggested he continue with generic Azilect once daily.   I saw him on 10/26/2015, at which time he reported doing quite well, had some stumbling episodes, no actual falls, was trying to drink enough water and was still physically active. He had signed up for rock steady boxing classes but [name] noted that he would get too exhausted.   I saw him on 06/22/2015, at which time he reported doing fairly well. He had finished outpatient OT, PT, and ST. Unfortunately, he did take a fall in the garage couple of months prior as he was coming in from the graduate, stepped backwards on the stairs and slipped off falling backwards, landed on his behind, head struck the car, he denied loss of consciousness or headache or bruise and no sequelae were reported. He was having rails installed in the garage entrance. Was still trying to stay active, playing golf. We mutually agreed to  continue with Sinemet 4 times a day and Azilect once daily. We talked about fall risk and gait safety and fall precautions.   I saw him on 02/23/2015, at which time he reported doing fairly well. He was playing golf regularly. His right knee was bothering him. He had no recent falls. He was trying to hydrate well enough. He had no new issues with A. fib and no new complaints otherwise. I noted right knee swelling. He was advised to follow-up with his primary care physician for this. We mutually agreed to keep his Parkinson's medications the same.    I saw him on 11/01/2014 at which time he reported a recent diagnosis with A. fib. He was admitted to the hospital in April. I reviewed the hospital records from 07/17/2014 through 07/18/2014. He was started on Xarelto. He was then re-admitted on 08/09/14 to 08/10/14 due to altered mental status and slurring of speech and was suspected to have a TIA. He was seen by  neuro in consultation and a baby aspirin was added to Xarelto. He presented back to the ER on 08/18/14. He a [place] wo contrast on  08/18/2014 , which was negative for any acute changes. He was seen by the Neurologist in the emergency room and it was felt that a full TIA workup was not necessary at the time. He had presented with hypertension and some slurring of speech. He was switched from Xarelto to Eliquis, because the Xarelto is not on formulary at the [place].  He endorsed recent stressors, including the recent passing of his younger brother a week prior with advanced Alzheimer's disease.    He had an MRI brain and MRA head on 08/09/14: MRI HEAD: No acute intracranial process, specifically no acute ischemia. Involutional changes. Mild white matter changes suggest chronic small vessel ischemic disease. MRA HEAD: No acute large vessel occlusion or high-grade stenosis. Mid grade stenosis RIGHT P2/3 segment.    I suggested we continue with Sinemet at 4 times a day. He was advised to continue with Azilect as well.  He was advised to drink more water. In the interim, we restarted outpatient physical therapy, occupational therapy and speech therapy.   I saw him on 04/29/2014, at which time he reported doing well overall but he was more fatigued and had some excessive daytime somnolence. Memory was stable. His mood was stable. He noted some blood pressure fluctuations. Sometimes he had a dull headache and sometimes he felt lightheaded. He had some anxiety over his lady friend's health, as she had fallen and broken rib. He continued to walk regularly and played golf. He had no recent falls. I asked him to continue with his medications, Azilect once daily and Sinemet 4 times a day.   I saw him on 10/28/2013, at which time he reported doing well. In particular, had no cognitive complaints, no mood issues, and continued to play golf 3 times a week. He was driving well. I suggested an increase in his levodopa to 1 pill 4 times a day of the 25-100 milligrams strength. [facility] and had seen a [place] neurologist as well.    I saw him on 12/07/2012, at which time I felt that he was doing well on Azilect and levodopa. He called in December 2014 with problems with blood pressure fluctuations. He was wondering if this came from the [place] but I did not think it was due to the Azilect per se. I was reluctant to take him off of it.    I first met him on 06/03/2012 and he previously followed by Dr. Morene Antu. He has had primarily right-sided symptoms with regards to his Parkinson's, diagnosed in 2010 with symptoms dating back to late 2009 or early 2010. He had briefly tried Mirapex but was taken off d/t hypotension. L spine MRI in June 2013 showed renal cysts, degenerative joint disease most prominent at L4-5. He tried acupuncture. His MRI of the lumbar spine showed abnormalities with his kidney with a cyst and a tumor and he had ablative surgery on 07/10/12 and had a FU CT done. He had lower back  surgery on 05/14/12.  I saw him back on 09/03/2012 and I suggested starting Azilect. He has been tolerating it well and both he and his girlfriend felt that he did better with it in terms of dexterity and fine motor control. He had some hypotension and lightheadedness and reduced his BP medication. He has been having some issue with  gout.   His Past Medical History Is Significant For: Past Medical History:  Diagnosis Date  . A-fib ([place])   . Arthritis    "right knee" (08/09/2014)  . Basal cell carcinoma   . Benign prostatic hypertrophy with urinary obstruction   . Chronic anticoagulation 12/04/2016  . Complication of anesthesia    "difficulty intubation, had to use fiberoptic 2006"  . Difficult intubation   . DJD (degenerative joint disease)   . Gout   . Hx of colonic polyps   . Hyperlipidemia   . Hypertension    sees [name]  . Hypothyroidism   . Kidney tumor    "tumor on cyst on kidney"   . 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 Mini stroke ([place])   . Parkinson disease ([place])   . Pneumonia    hx of in 1952  . Primary skin squamous cell carcinoma   . Subdural hematoma ([place]) 2006  . TIA (transient ischemic attack)    pt does not recall this hx "but may have had one today" (08/09/2014)    His Past Surgical History Is Significant For: Past Surgical History:  Procedure Laterality Date  . APPENDECTOMY    . BACK SURGERY    . CATARACT EXTRACTION W/ INTRAOCULAR LENS  IMPLANT, BILATERAL Bilateral ~ 1998  . CHOLECYSTECTOMY    . CRYOABLATION  07-10-12   "tumor on cyst on my kidney; had an ablation"  . EYE SURGERY    . INGUINAL HERNIA REPAIR Right   . IR GENERIC HISTORICAL  11/09/2015   IR RADIOLOGIST EVAL & MGMT 11/09/2015 [name], MD [facility] INTERV RAD  . IR RADIOLOGIST EVAL & MGMT  08/13/2016  . KIDNEY SURGERY    . KNEE ARTHROSCOPY Right   . LEFT HEART CATH AND CORONARY ANGIOGRAPHY N/A 12/05/2016   Procedure: LEFT HEART CATH AND CORONARY  ANGIOGRAPHY;  Surgeon: [name], [name], MD;  Location: [place] [facility];  Service: Cardiovascular;  Laterality: N/A;  . LUMBAR LAMINECTOMY/DECOMPRESSION MICRODISCECTOMY  05/14/2012   Procedure: LUMBAR LAMINECTOMY/DECOMPRESSION MICRODISCECTOMY 1 LEVEL;  Surgeon: [name], MD;  Location: [facility];  Service: Neurosurgery;  Laterality: Left;  Left Lumbar four-five Laminectomy/Diskectomy/Far lateral diskectomy  . SKIN CANCER EXCISION  "several"   "back of my neck; right eye; clavicle; nose"  . SUBDURAL HEMATOMA EVACUATION VIA CRANIOTOMY  2006  . THYROIDECTOMY     medullary carcinoma  . TONSILLECTOMY      His Family History Is Significant For: Family History  Problem Relation Age of Onset  . Stroke Mother   . Stroke Father   . Heart disease Father   . Alzheimer's disease Brother   . Alzheimer's disease Sister     His Social History Is Significant For: Social History   Socioeconomic History  . Marital status: Widowed    Spouse name: Not on file  . Number of children: 3  . Years of education: 38  . Highest education level: Not on file  Occupational History  . Occupation: retired  Scientific laboratory technician  . Financial resource strain: Not on file  . Food insecurity    Worry: Not on file    Inability: Not on file  . Transportation needs    Medical: Not on file    Non-medical: Not on file  Tobacco Use  . Smoking status: Former Smoker    Packs/day: 0.80    Years: 15.00    Pack years: 12.00    Types:  Cigarettes    Quit date: 04/15/1965    Years since quitting: 53.5  . Smokeless tobacco: Never Used  Substance and Sexual Activity  . Alcohol use: Yes    Alcohol/week: 5.0 standard drinks    Types: 5 Glasses of wine per week    Comment: 1 glass of wine every night (6oz glass)  . Drug use: No  . Sexual activity: Yes  Lifestyle  . Physical activity    Days per week: Not on file    Minutes per session: Not on file  . Stress: Not on file  Relationships  . Social Product manager on phone: Not on file    Gets together: Not on file    Attends religious service: Not on file    Active member of club or organization: Not on file    Attends meetings of clubs or organizations: Not on file    Relationship status: Not on file  Other Topics Concern  . Not on file  Social History Narrative   Married.   Lives at home with significant other.   Retried. 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   Enjoys golfing, yard work, spending time with family.      His Allergies Are:  No Known Allergies:   His Current Medications Are:  Outpatient Encounter Medications as of 11/04/2018  Medication Sig  . amLODipine (NORVASC) 2.5 MG tablet TAKE 1 TABLET DAILY TO TAKEWITH AMLODIPINE 5MG FOR    TOTAL OF 7.5MG DAILY  . amLODipine (NORVASC) 5 MG tablet TAKE 1 TABLET DAILY TO TAKEWITH 2.5MG TO MAKE 7.5MG   DAILY  . apixaban (ELIQUIS) 2.5 MG TABS tablet Take 1 tablet (2.5 mg total) by mouth 2 (two) times daily.  . carbidopa-levodopa (SINEMET IR) 25-100 MG tablet Take 1.5 tablets by mouth 4 (four) times daily. At 8, 12, 4 PM, and 8 PM  . hydrALAZINE (APRESOLINE) 10 MG tablet Take 1 tablet by mouth up to three times daily for systolic blood pressure readings above 170.  . levothyroxine (SYNTHROID) 100 MCG tablet TAKE 1 TABLET DAILY ON AN  EMPTY STOMACH WITH A FULL  GLASS OF WATER  . losartan (COZAAR) 100 MG tablet Take 50 mg by mouth daily at 2 PM.  . Multiple Vitamin (MULTIVITAMIN WITH MINERALS) TABS Take 1 tablet by mouth daily. Reported on 08/01/2015  . nitroGLYCERIN (NITROSTAT) 0.4 MG SL tablet Place 1 tablet (0.4 mg total) under the tongue every 5 (five) minutes x 3 doses as needed for chest pain.  . Probiotic Product (ALIGN) 4 MG CAPS Take 1 capsule by mouth daily.  . rasagiline (AZILECT) 1 MG TABS tablet Take 1 tablet (1 mg total) by mouth daily.  . simvastatin (ZOCOR) 40 MG tablet TAKE 1 TABLET AT BEDTIME   No facility-administered encounter medications on file as of 11/04/2018.    :  Review of Systems:  Out of a complete 14 point review of systems, all are reviewed and negative with the exception of these symptoms as listed below: Review of Systems  Neurological:       Pt presents today to discuss his PD. Pt feels that he is doing well. His BP seems to be lower during the day.    Objective:  Neurological Exam  Physical Exam Physical Examination:   Vitals:   11/04/18 1403  BP: 92/62  Pulse: (!) 45    General Examination: The patient is a very pleasant 85 y.o. male in no acute distress. He appears well-developed  and well-nourished and very well groomed. Mildly deconditioned appearing. Seems to have lost a little bit of weight.  HEENT:Normocephalic, atraumatic, pupils are equal, round and reactive to light and accommodation. Extraocular trackingshows mild saccadic breakdown. Speech is moderately hypophonic and mildly to moderatelydysarthric, but stable. Hearing is impaired. Oropharynx exam revealsmoderatemouth dryness, tongue protrudes centrally and palate elevates symmetrically. No other abnormal findings, he has moderateto severenuchal rigidity and decrease in passive range of motion.  Chest:Clear to auscultation without wheezing, rhonchi or crackles noted.  Heart:S1+S2+0, regular and normal without murmurs, rubs or gallops noted.   Abdomen:Soft, non-tender and non-distended with normal bowel sounds appreciated on auscultation.  Extremities:There is nopitting edema in the distal lower extremities bilaterally.   Skin: Warm and dry without trophic changes noted. Mild bruising on hands, stable, chronic.  Musculoskeletal: exam reveals no obvious joint deformities, tenderness or joint swelling orredness. Some R knee discomfort, stable.  Neurologically:  Mental status: The patient is awake, alert and oriented in all 4 spheres. Hisimmediate and remote memory, attention, language skills and fund of knowledge are appropriate. There is no  evidence of aphasia, agnosia, apraxia or anomia. Speech is clear with normal prosody and enunciation. Thought process is linear. Mood is normaland affect is normal.  Cranial nerves II - XII are as described above under HEENT exam.  Motor exam:thin muscle bulk globally, average strength for age is about 61/5.He has increased tone in the right more than left upper and lower extremities. He has an intermittent resting tremor in both upper extremities.Fine motor skills are moderately impaired on the right andslightly better on theleft. He has mild intermittent dyskinesias, mostly on the right side and some cocontraction.Reflexes are 1+ throughout. He stands up with mild difficulty andpushes himself up. Posture ismildly stooped for age.He walks with decreased stride length and decrease pace,decreased arm swing bilaterally, right more than left. More insecurity with turns today. Sensory exam is intact to light touch. Cerebellar testing shows no additional concern for dysmetria or intention tremor or gait ataxia.  Assessment and Plan:   In summary, Evon Lopezperez Broughtonis a very pleasant 85 year old malewithan underlying medical history of right TIA in January 2003, SDH (s/p left craniotomy in August 2006), hypertension, hypothyroidism, thyroid cancer, partial onset seizures (off Dilantin), lumbar spine disease, status-post lower back surgery at L4-5 in January 2014, renal tumor, status post kidney tumor ablative surgery in March 2014, who presents for followup consultation of hisadvancedright-sided predominant [name],complicated bysome autonomic dysregulation, history of falls, and overall muscular deconditioning and advancing age and balance issues. His blood pressure has been on the lower end, but stable, heart rate in the high 40s and lower 50s, typically stable. He is currently on generic Azilect 1 mg once daily and generic Sinemet 1-1/2 pills 4 times a day. He is advised to continue  his medications at the same doses and times. He gets his prescriptions from the [place], I did his refills today. He is advised to continue with exercises regularly,and stay well hydrated. We will pursue therapy including PT, OT, and ST through [facility], phone [phone], fax [phone]. I suggested a 4 month follow-up, sooner if needed. He is advised to get his hearing checked out. I answered all their questions today and the patient and [name] were in agreement.  I spent 20 minutes in total face-to-face time with the patient, more than 50% of which was spent in counseling and coordination of care, reviewing test results, reviewing medication and discussing or reviewing the diagnosis  of PD, its prognosis and treatment options. Pertinent laboratory and imaging test results that were available during this visit with the patient were reviewed by me and considered in my medical decision making (see chart for details).

## 2018-11-04 NOTE — Patient Instructions (Addendum)
Your exam is stable, let's continue with your current medications.  I will make a referral to physical, occupational and speech therapy through [facility]. I renewed your prescriptions, we will fax to the [place] as usual.Please try to stay well-hydrated with water.Follow-up in 4 months

## 2018-11-04 NOTE — Progress Notes (Signed)
RXs for azilect and sinemet faxed to [place] at [phone]. Received a receipt of confirmation.

## 2018-11-05 ENCOUNTER — Ambulatory Visit: Payer: Medicare HMO | Admitting: Neurology

## 2018-11-06 DIAGNOSIS — G20 Parkinson's disease: Secondary | ICD-10-CM | POA: Diagnosis not present

## 2018-11-06 DIAGNOSIS — R69 Illness, unspecified: Secondary | ICD-10-CM | POA: Diagnosis not present

## 2018-11-10 ENCOUNTER — Encounter: Payer: Self-pay | Admitting: Neurology

## 2018-11-12 DIAGNOSIS — M6281 Muscle weakness (generalized): Secondary | ICD-10-CM | POA: Diagnosis not present

## 2018-11-12 DIAGNOSIS — G20 Parkinson's disease: Secondary | ICD-10-CM | POA: Diagnosis not present

## 2018-11-12 DIAGNOSIS — R278 Other lack of coordination: Secondary | ICD-10-CM | POA: Diagnosis not present

## 2018-11-18 DIAGNOSIS — R278 Other lack of coordination: Secondary | ICD-10-CM | POA: Diagnosis not present

## 2018-11-18 DIAGNOSIS — G20 Parkinson's disease: Secondary | ICD-10-CM | POA: Diagnosis not present

## 2018-11-18 DIAGNOSIS — R498 Other voice and resonance disorders: Secondary | ICD-10-CM | POA: Diagnosis not present

## 2018-11-18 DIAGNOSIS — R471 Dysarthria and anarthria: Secondary | ICD-10-CM | POA: Diagnosis not present

## 2018-11-18 DIAGNOSIS — M6281 Muscle weakness (generalized): Secondary | ICD-10-CM | POA: Diagnosis not present

## 2018-11-19 DIAGNOSIS — M6281 Muscle weakness (generalized): Secondary | ICD-10-CM | POA: Diagnosis not present

## 2018-11-19 DIAGNOSIS — R471 Dysarthria and anarthria: Secondary | ICD-10-CM | POA: Diagnosis not present

## 2018-11-19 DIAGNOSIS — G20 Parkinson's disease: Secondary | ICD-10-CM | POA: Diagnosis not present

## 2018-11-19 DIAGNOSIS — R278 Other lack of coordination: Secondary | ICD-10-CM | POA: Diagnosis not present

## 2018-11-19 DIAGNOSIS — R498 Other voice and resonance disorders: Secondary | ICD-10-CM | POA: Diagnosis not present

## 2018-11-20 DIAGNOSIS — R471 Dysarthria and anarthria: Secondary | ICD-10-CM | POA: Diagnosis not present

## 2018-11-20 DIAGNOSIS — G20 Parkinson's disease: Secondary | ICD-10-CM | POA: Diagnosis not present

## 2018-11-20 DIAGNOSIS — R498 Other voice and resonance disorders: Secondary | ICD-10-CM | POA: Diagnosis not present

## 2018-11-20 DIAGNOSIS — R278 Other lack of coordination: Secondary | ICD-10-CM | POA: Diagnosis not present

## 2018-11-20 DIAGNOSIS — M6281 Muscle weakness (generalized): Secondary | ICD-10-CM | POA: Diagnosis not present

## 2018-11-21 ENCOUNTER — Other Ambulatory Visit: Payer: Self-pay | Admitting: Cardiology

## 2018-11-25 DIAGNOSIS — G20 Parkinson's disease: Secondary | ICD-10-CM | POA: Diagnosis not present

## 2018-11-25 DIAGNOSIS — R498 Other voice and resonance disorders: Secondary | ICD-10-CM | POA: Diagnosis not present

## 2018-11-25 DIAGNOSIS — M6281 Muscle weakness (generalized): Secondary | ICD-10-CM | POA: Diagnosis not present

## 2018-11-25 DIAGNOSIS — R471 Dysarthria and anarthria: Secondary | ICD-10-CM | POA: Diagnosis not present

## 2018-11-25 DIAGNOSIS — R278 Other lack of coordination: Secondary | ICD-10-CM | POA: Diagnosis not present

## 2018-11-25 DIAGNOSIS — R69 Illness, unspecified: Secondary | ICD-10-CM | POA: Diagnosis not present

## 2018-11-26 ENCOUNTER — Telehealth: Payer: Self-pay

## 2018-11-26 DIAGNOSIS — R498 Other voice and resonance disorders: Secondary | ICD-10-CM | POA: Diagnosis not present

## 2018-11-26 DIAGNOSIS — G20 Parkinson's disease: Secondary | ICD-10-CM | POA: Diagnosis not present

## 2018-11-26 DIAGNOSIS — R471 Dysarthria and anarthria: Secondary | ICD-10-CM | POA: Diagnosis not present

## 2018-11-26 DIAGNOSIS — M6281 Muscle weakness (generalized): Secondary | ICD-10-CM | POA: Diagnosis not present

## 2018-11-26 DIAGNOSIS — R278 Other lack of coordination: Secondary | ICD-10-CM | POA: Diagnosis not present

## 2018-11-26 NOTE — Telephone Encounter (Signed)
Called asking for kristin or raquel needs a call back regarding advice but didn't say what for and they also need a losartan refill

## 2018-11-26 NOTE — Telephone Encounter (Signed)
Patient requesting follow up with Dr Percival Spanish.for hypotension follow up

## 2018-11-27 DIAGNOSIS — R278 Other lack of coordination: Secondary | ICD-10-CM | POA: Diagnosis not present

## 2018-11-27 DIAGNOSIS — R471 Dysarthria and anarthria: Secondary | ICD-10-CM | POA: Diagnosis not present

## 2018-11-27 DIAGNOSIS — G20 Parkinson's disease: Secondary | ICD-10-CM | POA: Diagnosis not present

## 2018-11-27 DIAGNOSIS — M6281 Muscle weakness (generalized): Secondary | ICD-10-CM | POA: Diagnosis not present

## 2018-11-27 DIAGNOSIS — R498 Other voice and resonance disorders: Secondary | ICD-10-CM | POA: Diagnosis not present

## 2018-12-01 DIAGNOSIS — R278 Other lack of coordination: Secondary | ICD-10-CM | POA: Diagnosis not present

## 2018-12-01 DIAGNOSIS — R498 Other voice and resonance disorders: Secondary | ICD-10-CM | POA: Diagnosis not present

## 2018-12-01 DIAGNOSIS — M6281 Muscle weakness (generalized): Secondary | ICD-10-CM | POA: Diagnosis not present

## 2018-12-01 DIAGNOSIS — G20 Parkinson's disease: Secondary | ICD-10-CM | POA: Diagnosis not present

## 2018-12-01 DIAGNOSIS — R471 Dysarthria and anarthria: Secondary | ICD-10-CM | POA: Diagnosis not present

## 2018-12-02 DIAGNOSIS — R471 Dysarthria and anarthria: Secondary | ICD-10-CM | POA: Diagnosis not present

## 2018-12-02 DIAGNOSIS — G20 Parkinson's disease: Secondary | ICD-10-CM | POA: Diagnosis not present

## 2018-12-02 DIAGNOSIS — R498 Other voice and resonance disorders: Secondary | ICD-10-CM | POA: Diagnosis not present

## 2018-12-02 DIAGNOSIS — R278 Other lack of coordination: Secondary | ICD-10-CM | POA: Diagnosis not present

## 2018-12-02 DIAGNOSIS — M6281 Muscle weakness (generalized): Secondary | ICD-10-CM | POA: Diagnosis not present

## 2018-12-04 DIAGNOSIS — R471 Dysarthria and anarthria: Secondary | ICD-10-CM | POA: Diagnosis not present

## 2018-12-04 DIAGNOSIS — G20 Parkinson's disease: Secondary | ICD-10-CM | POA: Diagnosis not present

## 2018-12-04 DIAGNOSIS — M6281 Muscle weakness (generalized): Secondary | ICD-10-CM | POA: Diagnosis not present

## 2018-12-04 DIAGNOSIS — R278 Other lack of coordination: Secondary | ICD-10-CM | POA: Diagnosis not present

## 2018-12-04 DIAGNOSIS — R498 Other voice and resonance disorders: Secondary | ICD-10-CM | POA: Diagnosis not present

## 2018-12-06 NOTE — Progress Notes (Signed)
Cardiology Office Note   Date:  12/07/2018   ID:  William [name], DOB 1928/10/14, MRN [id number]  PCP:  William Koch, NP  Cardiologist:   William Breeding, MD     Chief Complaint  Patient presents with  . Hypotension       History of Present Illness: William [name] is a 85 y.o. male who presents for evaluation of atrial fibrillation.  He has had paroxysmal atrial fibrillation. He's had a TIA as well.  He was admitted in August of last year with hypertension and elevated troponin.  Cardiac catheterization demonstrated 99% septal perforator and 50% LAD stenosis.  His ejection fraction was well preserved.  He did have an elevated EDP.  He was managed medically.  We have seen him for med titration.    Since I last saw him he has been keeping a strict blood pressure diary.  Unfortunately his blood pressures range from the [id number] systolic range to a systolic of 95.  Diastolics are usually trending in the same direction with highs of 101 and lows of 60.  He might feel a little lightheaded when his blood pressure is low.  If his blood pressure is high and sustains high his wife gives him my hydralazine.  They do notice it drop in his blood pressure after he eats his breakfast.  It turns out he also takes his Azilect in the morning and then sees his blood pressure fall soon after.  He is not had any syncope.  They are now living at [facility].  He is limited by his Parkinson's.  He has not had any chest pressure, neck or arm discomfort.  He has had no new shortness of breath, PND or orthopnea.  He has had no weight gain or edema.    Past Medical History:  Diagnosis Date  . A-fib ([place])   . Arthritis    "right knee" (08/09/2014)  . Basal cell carcinoma   . Benign prostatic hypertrophy with urinary obstruction   . Chronic anticoagulation 12/04/2016  . Complication of anesthesia    "difficulty intubation, had to use fiberoptic 2006"  . Difficult intubation   . DJD (degenerative joint  disease)   . Gout   . Hx of colonic polyps   . Hyperlipidemia   . Hypertension    sees [name]  . Hypothyroidism   . Kidney tumor    "tumor on cyst on kidney"   . 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 Mini stroke (William [name])   . Parkinson disease (William [name])   . Pneumonia    hx of in 1952  . Primary skin squamous cell carcinoma   . Subdural hematoma ([place]) 2006  . TIA (transient ischemic attack)    pt does not recall this hx "but may have had one today" (08/09/2014)    Past Surgical History:  Procedure Laterality Date  . APPENDECTOMY    . BACK SURGERY    . CATARACT EXTRACTION W/ INTRAOCULAR LENS  IMPLANT, BILATERAL Bilateral ~ 1998  . CHOLECYSTECTOMY    . CRYOABLATION  07-10-12   "tumor on cyst on my kidney; had an ablation"  . EYE SURGERY    . INGUINAL HERNIA REPAIR Right   . IR GENERIC HISTORICAL  11/09/2015   IR RADIOLOGIST EVAL & MGMT 11/09/2015 William Edouard, MD [facility] INTERV RAD  . IR RADIOLOGIST EVAL & MGMT  08/13/2016  . KIDNEY SURGERY    . KNEE ARTHROSCOPY  Right   . LEFT HEART CATH AND CORONARY ANGIOGRAPHY N/A 12/05/2016   Procedure: LEFT HEART CATH AND CORONARY ANGIOGRAPHY;  Surgeon: [name], [name], MD;  Location: [place] [facility];  Service: Cardiovascular;  Laterality: N/A;  . LUMBAR LAMINECTOMY/DECOMPRESSION MICRODISCECTOMY  05/14/2012   Procedure: LUMBAR LAMINECTOMY/DECOMPRESSION MICRODISCECTOMY 1 LEVEL;  Surgeon: William Connors, MD;  Location: [place] [facility];  Service: Neurosurgery;  Laterality: Left;  Left Lumbar four-five Laminectomy/Diskectomy/Far lateral diskectomy  . SKIN CANCER EXCISION  "several"   "back of my neck; right eye; clavicle; nose"  . SUBDURAL HEMATOMA EVACUATION VIA CRANIOTOMY  2006  . THYROIDECTOMY     medullary carcinoma  . TONSILLECTOMY       Current Outpatient Medications  Medication Sig Dispense Refill  . amLODipine (NORVASC) 2.5 MG tablet TAKE 1 TABLET DAILY TO TAKEWITH AMLODIPINE 5MG  FOR    TOTAL OF 7.5MG   DAILY 90 tablet 2  . amLODipine (NORVASC) 5 MG tablet TAKE 1 TABLET DAILY TO TAKEWITH 2.5MG  TO MAKE 7.5MG    DAILY 90 tablet 2  . apixaban (ELIQUIS) 2.5 MG TABS tablet Take 1 tablet (2.5 mg total) by mouth 2 (two) times daily. 60 tablet 4  . carbidopa-levodopa (SINEMET IR) 25-100 MG tablet Take 1.5 tablets by mouth 4 (four) times daily. At 8, 12, 4 PM, and 8 PM 540 tablet 3  . hydrALAZINE (APRESOLINE) 10 MG tablet Take 1 tablet by mouth up to three times daily for systolic blood pressure readings above 170. 90 tablet 0  . levothyroxine (SYNTHROID) 100 MCG tablet TAKE 1 TABLET DAILY ON AN  EMPTY STOMACH WITH A FULL  GLASS OF WATER 90 tablet 1  . losartan (COZAAR) 100 MG tablet Take 100 mg by mouth daily at 2 PM.     . Multiple Vitamin (MULTIVITAMIN WITH MINERALS) TABS Take 1 tablet by mouth daily. Reported on 08/01/2015    . nitroGLYCERIN (NITROSTAT) 0.4 MG SL tablet Place 1 tablet (0.4 mg total) under the tongue every 5 (five) minutes x 3 doses as needed for chest pain. 25 tablet 12  . Probiotic Product (ALIGN) 4 MG CAPS Take 1 capsule by mouth daily.    . rasagiline (AZILECT) 1 MG TABS tablet Take 1 tablet (1 mg total) by mouth daily. 90 tablet 3  . simvastatin (ZOCOR) 40 MG tablet TAKE 1 TABLET AT BEDTIME 90 tablet 1   No current facility-administered medications for this visit.     Allergies:   Patient has no known allergies.    ROS:  Please see the history of present illness.   Otherwise, review of systems are positive for none.   All other systems are reviewed and negative.    PHYSICAL EXAM: VS:  BP (!) 175/78   Pulse (!) 43   Ht 6\' 4"  (1.93 [name])   Wt 178 lb (80.7 kg)   SpO2 95%   BMI 21.67 kg/[name]  , BMI Body mass index is 21.67 kg/[name].  GENERAL:  Well appearing NECK:  No jugular venous distention, waveform within normal limits, carotid upstroke brisk and symmetric, no bruits, no thyromegaly LUNGS:  Clear to auscultation bilaterally CHEST:  Unremarkable HEART:  PMI not displaced or  sustained,S1 and S2 within normal limits, no S3, no S4, no clicks, no rubs, no murmurs ABD:  Flat, positive bowel sounds normal in frequency in pitch, no bruits, no rebound, no guarding, no midline pulsatile mass, no hepatomegaly, no splenomegaly EXT:  2 plus pulses throughout, no edema, no cyanosis no clubbing NERUO: Resting tremorr.  EKG: NA   Recent Labs: 01/09/2018: TSH 2.54 02/23/2018: BUN 17; Creatinine, Ser 1.33; Hemoglobin 14.4; Platelets 174; Potassium 4.2; Sodium 145    Lipid Panel    Component Value Date/Time   CHOL 116 12/05/2016 0207   TRIG 139 12/05/2016 0207   TRIG 101 03/25/2006 0935   HDL 46 12/05/2016 0207   CHOLHDL 2.5 12/05/2016 0207   VLDL 28 12/05/2016 0207   LDLCALC 42 12/05/2016 0207      Wt Readings from Last 3 Encounters:  12/07/18 178 lb (80.7 kg)  11/04/18 178 lb (80.7 kg)  09/10/18 175 lb 8 oz (79.6 kg)      Other studies Reviewed: Additional studies/ records that were reviewed today include:    Labs Review of the above records demonstrates:   BP as above.      ASSESSMENT AND PLAN:  ATRIAL FIB:  The patient has a CHA2DS2 - VASc score of 5.   No change in therapy.   THORACIC AORTIC ENLARGEMENT:    This was 4.4 cm on CT in Dec.  I will likely follow this with a repeat CT in June of next year.   HTN:  The blood pressure is fluctuating significantly.  It does seem that it goes down after he takes his morning dose of Azilect.  This medication can frequently cause drops in blood pressure that can be significant.  This may certainly contribute.  I have asked him to talk with his neurologist to see if he can start taking this in the evening when he is often having high blood pressure before he goes to bed.  In the meantime his wife will continue the hydralazine as needed sustain systolic above [id number].  If he still having wild fluctuations I might need a 24-hour blood pressure monitor to further manage.   CAD:  The patient has no new sypmtoms.  No  further cardiovascular testing is indicated.  We will continue with aggressive risk reduction and meds as listed.  CHRONIC DIASTOLIC HF:   He seems to be euvolemic.  No change in therapy.  CKD II: Creatinine was slightly lower at 1.33 I reviewed the blood work for this appointment.   Current medicines are reviewed at length with the patient today.  The patient does not have concerns regarding medicines.  The following changes have been made:  None  Labs/ tests ordered today include: None  No orders of the defined types were placed in this encounter.    Disposition:   FU with me in 4 months.    Signed, William Breeding, MD  12/07/2018 5:27 PM    [facility]

## 2018-12-07 ENCOUNTER — Encounter: Payer: Self-pay | Admitting: Neurology

## 2018-12-07 ENCOUNTER — Other Ambulatory Visit: Payer: Self-pay

## 2018-12-07 ENCOUNTER — Ambulatory Visit: Payer: Medicare HMO | Admitting: Cardiology

## 2018-12-07 ENCOUNTER — Encounter: Payer: Self-pay | Admitting: Cardiology

## 2018-12-07 VITALS — BP 175/78 | HR 43 | Ht 76.0 in | Wt 178.0 lb

## 2018-12-07 DIAGNOSIS — I959 Hypotension, unspecified: Secondary | ICD-10-CM | POA: Diagnosis not present

## 2018-12-07 DIAGNOSIS — R498 Other voice and resonance disorders: Secondary | ICD-10-CM | POA: Diagnosis not present

## 2018-12-07 DIAGNOSIS — R278 Other lack of coordination: Secondary | ICD-10-CM | POA: Diagnosis not present

## 2018-12-07 DIAGNOSIS — G20 Parkinson's disease: Secondary | ICD-10-CM | POA: Diagnosis not present

## 2018-12-07 DIAGNOSIS — I5032 Chronic diastolic (congestive) heart failure: Secondary | ICD-10-CM

## 2018-12-07 DIAGNOSIS — I712 Thoracic aortic aneurysm, without rupture, unspecified: Secondary | ICD-10-CM

## 2018-12-07 DIAGNOSIS — R471 Dysarthria and anarthria: Secondary | ICD-10-CM | POA: Diagnosis not present

## 2018-12-07 DIAGNOSIS — I480 Paroxysmal atrial fibrillation: Secondary | ICD-10-CM | POA: Diagnosis not present

## 2018-12-07 DIAGNOSIS — M6281 Muscle weakness (generalized): Secondary | ICD-10-CM | POA: Diagnosis not present

## 2018-12-07 NOTE — Patient Instructions (Addendum)
Medication Instructions:  Your physician recommends that you continue on your current medications as directed. Please refer to the Current Medication list given to you today.  If you need a refill on your cardiac medications before your next appointment, please call your pharmacy.   Lab work: NONE  Testing/Procedures: NONE   Follow-Up: At [facility], you and your health needs are our priority.  As part of our continuing mission to provide you with exceptional heart care, we have created designated Provider Care Teams.  These Care Teams include your primary Cardiologist (physician) and Advanced Practice Providers (APPs -  Physician Assistants and Nurse Practitioners) who all work together to provide you with the care you need, when you need it. You will need a follow up appointment in 4 months.  Please call our office 2 months in advance to schedule this appointment.  You may see DR [facility]  or one of the following Advanced Practice Providers on your designated Care Team:   Rosaria Ferries, PA-C . Jory Sims, DNP, ANP

## 2018-12-08 DIAGNOSIS — G20 Parkinson's disease: Secondary | ICD-10-CM | POA: Diagnosis not present

## 2018-12-08 DIAGNOSIS — R278 Other lack of coordination: Secondary | ICD-10-CM | POA: Diagnosis not present

## 2018-12-08 DIAGNOSIS — R498 Other voice and resonance disorders: Secondary | ICD-10-CM | POA: Diagnosis not present

## 2018-12-08 DIAGNOSIS — M6281 Muscle weakness (generalized): Secondary | ICD-10-CM | POA: Diagnosis not present

## 2018-12-08 DIAGNOSIS — R471 Dysarthria and anarthria: Secondary | ICD-10-CM | POA: Diagnosis not present

## 2018-12-09 DIAGNOSIS — G20 Parkinson's disease: Secondary | ICD-10-CM | POA: Diagnosis not present

## 2018-12-09 DIAGNOSIS — R278 Other lack of coordination: Secondary | ICD-10-CM | POA: Diagnosis not present

## 2018-12-09 DIAGNOSIS — R498 Other voice and resonance disorders: Secondary | ICD-10-CM | POA: Diagnosis not present

## 2018-12-09 DIAGNOSIS — M6281 Muscle weakness (generalized): Secondary | ICD-10-CM | POA: Diagnosis not present

## 2018-12-09 DIAGNOSIS — R471 Dysarthria and anarthria: Secondary | ICD-10-CM | POA: Diagnosis not present

## 2018-12-09 MED ORDER — LOSARTAN POTASSIUM 100 MG PO TABS: 100.0000 mg | ORAL_TABLET | Freq: Every day | ORAL | 3 refills | Status: DC

## 2018-12-10 DIAGNOSIS — G20 Parkinson's disease: Secondary | ICD-10-CM | POA: Diagnosis not present

## 2018-12-10 DIAGNOSIS — R278 Other lack of coordination: Secondary | ICD-10-CM | POA: Diagnosis not present

## 2018-12-10 DIAGNOSIS — R471 Dysarthria and anarthria: Secondary | ICD-10-CM | POA: Diagnosis not present

## 2018-12-10 DIAGNOSIS — R498 Other voice and resonance disorders: Secondary | ICD-10-CM | POA: Diagnosis not present

## 2018-12-10 DIAGNOSIS — M6281 Muscle weakness (generalized): Secondary | ICD-10-CM | POA: Diagnosis not present

## 2018-12-14 DIAGNOSIS — M6281 Muscle weakness (generalized): Secondary | ICD-10-CM | POA: Diagnosis not present

## 2018-12-14 DIAGNOSIS — R471 Dysarthria and anarthria: Secondary | ICD-10-CM | POA: Diagnosis not present

## 2018-12-14 DIAGNOSIS — G20 Parkinson's disease: Secondary | ICD-10-CM | POA: Diagnosis not present

## 2018-12-14 DIAGNOSIS — R498 Other voice and resonance disorders: Secondary | ICD-10-CM | POA: Diagnosis not present

## 2018-12-14 DIAGNOSIS — R278 Other lack of coordination: Secondary | ICD-10-CM | POA: Diagnosis not present

## 2018-12-15 DIAGNOSIS — R471 Dysarthria and anarthria: Secondary | ICD-10-CM | POA: Diagnosis not present

## 2018-12-15 DIAGNOSIS — G20 Parkinson's disease: Secondary | ICD-10-CM | POA: Diagnosis not present

## 2018-12-15 DIAGNOSIS — M6281 Muscle weakness (generalized): Secondary | ICD-10-CM | POA: Diagnosis not present

## 2018-12-15 DIAGNOSIS — R1312 Dysphagia, oropharyngeal phase: Secondary | ICD-10-CM | POA: Diagnosis not present

## 2018-12-15 DIAGNOSIS — R498 Other voice and resonance disorders: Secondary | ICD-10-CM | POA: Diagnosis not present

## 2018-12-15 DIAGNOSIS — R278 Other lack of coordination: Secondary | ICD-10-CM | POA: Diagnosis not present

## 2018-12-16 DIAGNOSIS — G20 Parkinson's disease: Secondary | ICD-10-CM | POA: Diagnosis not present

## 2018-12-16 DIAGNOSIS — R471 Dysarthria and anarthria: Secondary | ICD-10-CM | POA: Diagnosis not present

## 2018-12-16 DIAGNOSIS — R498 Other voice and resonance disorders: Secondary | ICD-10-CM | POA: Diagnosis not present

## 2018-12-16 DIAGNOSIS — R1312 Dysphagia, oropharyngeal phase: Secondary | ICD-10-CM | POA: Diagnosis not present

## 2018-12-16 DIAGNOSIS — R278 Other lack of coordination: Secondary | ICD-10-CM | POA: Diagnosis not present

## 2018-12-16 DIAGNOSIS — M6281 Muscle weakness (generalized): Secondary | ICD-10-CM | POA: Diagnosis not present

## 2018-12-17 DIAGNOSIS — R471 Dysarthria and anarthria: Secondary | ICD-10-CM | POA: Diagnosis not present

## 2018-12-17 DIAGNOSIS — R1312 Dysphagia, oropharyngeal phase: Secondary | ICD-10-CM | POA: Diagnosis not present

## 2018-12-17 DIAGNOSIS — R278 Other lack of coordination: Secondary | ICD-10-CM | POA: Diagnosis not present

## 2018-12-17 DIAGNOSIS — R498 Other voice and resonance disorders: Secondary | ICD-10-CM | POA: Diagnosis not present

## 2018-12-17 DIAGNOSIS — M6281 Muscle weakness (generalized): Secondary | ICD-10-CM | POA: Diagnosis not present

## 2018-12-17 DIAGNOSIS — G20 Parkinson's disease: Secondary | ICD-10-CM | POA: Diagnosis not present

## 2018-12-18 DIAGNOSIS — R278 Other lack of coordination: Secondary | ICD-10-CM | POA: Diagnosis not present

## 2018-12-18 DIAGNOSIS — G20 Parkinson's disease: Secondary | ICD-10-CM | POA: Diagnosis not present

## 2018-12-18 DIAGNOSIS — M6281 Muscle weakness (generalized): Secondary | ICD-10-CM | POA: Diagnosis not present

## 2018-12-18 DIAGNOSIS — R498 Other voice and resonance disorders: Secondary | ICD-10-CM | POA: Diagnosis not present

## 2018-12-18 DIAGNOSIS — R1312 Dysphagia, oropharyngeal phase: Secondary | ICD-10-CM | POA: Diagnosis not present

## 2018-12-18 DIAGNOSIS — R471 Dysarthria and anarthria: Secondary | ICD-10-CM | POA: Diagnosis not present

## 2018-12-22 DIAGNOSIS — M6281 Muscle weakness (generalized): Secondary | ICD-10-CM | POA: Diagnosis not present

## 2018-12-22 DIAGNOSIS — R471 Dysarthria and anarthria: Secondary | ICD-10-CM | POA: Diagnosis not present

## 2018-12-22 DIAGNOSIS — R1312 Dysphagia, oropharyngeal phase: Secondary | ICD-10-CM | POA: Diagnosis not present

## 2018-12-22 DIAGNOSIS — G20 Parkinson's disease: Secondary | ICD-10-CM | POA: Diagnosis not present

## 2018-12-22 DIAGNOSIS — R498 Other voice and resonance disorders: Secondary | ICD-10-CM | POA: Diagnosis not present

## 2018-12-22 DIAGNOSIS — R278 Other lack of coordination: Secondary | ICD-10-CM | POA: Diagnosis not present

## 2018-12-23 DIAGNOSIS — M6281 Muscle weakness (generalized): Secondary | ICD-10-CM | POA: Diagnosis not present

## 2018-12-23 DIAGNOSIS — R278 Other lack of coordination: Secondary | ICD-10-CM | POA: Diagnosis not present

## 2018-12-23 DIAGNOSIS — G20 Parkinson's disease: Secondary | ICD-10-CM | POA: Diagnosis not present

## 2018-12-23 DIAGNOSIS — R498 Other voice and resonance disorders: Secondary | ICD-10-CM | POA: Diagnosis not present

## 2018-12-23 DIAGNOSIS — R471 Dysarthria and anarthria: Secondary | ICD-10-CM | POA: Diagnosis not present

## 2018-12-23 DIAGNOSIS — R1312 Dysphagia, oropharyngeal phase: Secondary | ICD-10-CM | POA: Diagnosis not present

## 2018-12-24 DIAGNOSIS — G20 Parkinson's disease: Secondary | ICD-10-CM | POA: Diagnosis not present

## 2018-12-24 DIAGNOSIS — R1312 Dysphagia, oropharyngeal phase: Secondary | ICD-10-CM | POA: Diagnosis not present

## 2018-12-24 DIAGNOSIS — M6281 Muscle weakness (generalized): Secondary | ICD-10-CM | POA: Diagnosis not present

## 2018-12-24 DIAGNOSIS — R278 Other lack of coordination: Secondary | ICD-10-CM | POA: Diagnosis not present

## 2018-12-24 DIAGNOSIS — R471 Dysarthria and anarthria: Secondary | ICD-10-CM | POA: Diagnosis not present

## 2018-12-24 DIAGNOSIS — R498 Other voice and resonance disorders: Secondary | ICD-10-CM | POA: Diagnosis not present

## 2018-12-25 DIAGNOSIS — R1312 Dysphagia, oropharyngeal phase: Secondary | ICD-10-CM | POA: Diagnosis not present

## 2018-12-25 DIAGNOSIS — R278 Other lack of coordination: Secondary | ICD-10-CM | POA: Diagnosis not present

## 2018-12-25 DIAGNOSIS — R471 Dysarthria and anarthria: Secondary | ICD-10-CM | POA: Diagnosis not present

## 2018-12-25 DIAGNOSIS — R498 Other voice and resonance disorders: Secondary | ICD-10-CM | POA: Diagnosis not present

## 2018-12-25 DIAGNOSIS — G20 Parkinson's disease: Secondary | ICD-10-CM | POA: Diagnosis not present

## 2018-12-25 DIAGNOSIS — M6281 Muscle weakness (generalized): Secondary | ICD-10-CM | POA: Diagnosis not present

## 2018-12-29 DIAGNOSIS — R498 Other voice and resonance disorders: Secondary | ICD-10-CM | POA: Diagnosis not present

## 2018-12-29 DIAGNOSIS — G20 Parkinson's disease: Secondary | ICD-10-CM | POA: Diagnosis not present

## 2018-12-29 DIAGNOSIS — R471 Dysarthria and anarthria: Secondary | ICD-10-CM | POA: Diagnosis not present

## 2018-12-29 DIAGNOSIS — R278 Other lack of coordination: Secondary | ICD-10-CM | POA: Diagnosis not present

## 2018-12-29 DIAGNOSIS — M6281 Muscle weakness (generalized): Secondary | ICD-10-CM | POA: Diagnosis not present

## 2018-12-29 DIAGNOSIS — R1312 Dysphagia, oropharyngeal phase: Secondary | ICD-10-CM | POA: Diagnosis not present

## 2018-12-30 DIAGNOSIS — M6281 Muscle weakness (generalized): Secondary | ICD-10-CM | POA: Diagnosis not present

## 2018-12-30 DIAGNOSIS — R1312 Dysphagia, oropharyngeal phase: Secondary | ICD-10-CM | POA: Diagnosis not present

## 2018-12-30 DIAGNOSIS — R471 Dysarthria and anarthria: Secondary | ICD-10-CM | POA: Diagnosis not present

## 2018-12-30 DIAGNOSIS — G20 Parkinson's disease: Secondary | ICD-10-CM | POA: Diagnosis not present

## 2018-12-30 DIAGNOSIS — R498 Other voice and resonance disorders: Secondary | ICD-10-CM | POA: Diagnosis not present

## 2018-12-30 DIAGNOSIS — R278 Other lack of coordination: Secondary | ICD-10-CM | POA: Diagnosis not present

## 2018-12-31 DIAGNOSIS — R498 Other voice and resonance disorders: Secondary | ICD-10-CM | POA: Diagnosis not present

## 2018-12-31 DIAGNOSIS — R1312 Dysphagia, oropharyngeal phase: Secondary | ICD-10-CM | POA: Diagnosis not present

## 2018-12-31 DIAGNOSIS — R471 Dysarthria and anarthria: Secondary | ICD-10-CM | POA: Diagnosis not present

## 2018-12-31 DIAGNOSIS — M6281 Muscle weakness (generalized): Secondary | ICD-10-CM | POA: Diagnosis not present

## 2018-12-31 DIAGNOSIS — G20 Parkinson's disease: Secondary | ICD-10-CM | POA: Diagnosis not present

## 2018-12-31 DIAGNOSIS — R278 Other lack of coordination: Secondary | ICD-10-CM | POA: Diagnosis not present

## 2019-01-01 DIAGNOSIS — M6281 Muscle weakness (generalized): Secondary | ICD-10-CM | POA: Diagnosis not present

## 2019-01-01 DIAGNOSIS — R1312 Dysphagia, oropharyngeal phase: Secondary | ICD-10-CM | POA: Diagnosis not present

## 2019-01-01 DIAGNOSIS — G20 Parkinson's disease: Secondary | ICD-10-CM | POA: Diagnosis not present

## 2019-01-01 DIAGNOSIS — R498 Other voice and resonance disorders: Secondary | ICD-10-CM | POA: Diagnosis not present

## 2019-01-01 DIAGNOSIS — R278 Other lack of coordination: Secondary | ICD-10-CM | POA: Diagnosis not present

## 2019-01-01 DIAGNOSIS — R471 Dysarthria and anarthria: Secondary | ICD-10-CM | POA: Diagnosis not present

## 2019-01-05 DIAGNOSIS — R1312 Dysphagia, oropharyngeal phase: Secondary | ICD-10-CM | POA: Diagnosis not present

## 2019-01-05 DIAGNOSIS — R471 Dysarthria and anarthria: Secondary | ICD-10-CM | POA: Diagnosis not present

## 2019-01-05 DIAGNOSIS — R278 Other lack of coordination: Secondary | ICD-10-CM | POA: Diagnosis not present

## 2019-01-05 DIAGNOSIS — M6281 Muscle weakness (generalized): Secondary | ICD-10-CM | POA: Diagnosis not present

## 2019-01-05 DIAGNOSIS — R498 Other voice and resonance disorders: Secondary | ICD-10-CM | POA: Diagnosis not present

## 2019-01-05 DIAGNOSIS — G20 Parkinson's disease: Secondary | ICD-10-CM | POA: Diagnosis not present

## 2019-01-06 ENCOUNTER — Other Ambulatory Visit: Payer: Self-pay | Admitting: Primary Care

## 2019-01-06 DIAGNOSIS — R498 Other voice and resonance disorders: Secondary | ICD-10-CM | POA: Diagnosis not present

## 2019-01-06 DIAGNOSIS — M109 Gout, unspecified: Secondary | ICD-10-CM

## 2019-01-06 DIAGNOSIS — R471 Dysarthria and anarthria: Secondary | ICD-10-CM | POA: Diagnosis not present

## 2019-01-06 DIAGNOSIS — I10 Essential (primary) hypertension: Secondary | ICD-10-CM

## 2019-01-06 DIAGNOSIS — M6281 Muscle weakness (generalized): Secondary | ICD-10-CM | POA: Diagnosis not present

## 2019-01-06 DIAGNOSIS — E890 Postprocedural hypothyroidism: Secondary | ICD-10-CM

## 2019-01-06 DIAGNOSIS — E7849 Other hyperlipidemia: Secondary | ICD-10-CM

## 2019-01-06 DIAGNOSIS — R1312 Dysphagia, oropharyngeal phase: Secondary | ICD-10-CM | POA: Diagnosis not present

## 2019-01-06 DIAGNOSIS — G20 Parkinson's disease: Secondary | ICD-10-CM | POA: Diagnosis not present

## 2019-01-06 DIAGNOSIS — R278 Other lack of coordination: Secondary | ICD-10-CM | POA: Diagnosis not present

## 2019-01-07 DIAGNOSIS — M6281 Muscle weakness (generalized): Secondary | ICD-10-CM | POA: Diagnosis not present

## 2019-01-07 DIAGNOSIS — R498 Other voice and resonance disorders: Secondary | ICD-10-CM | POA: Diagnosis not present

## 2019-01-07 DIAGNOSIS — R278 Other lack of coordination: Secondary | ICD-10-CM | POA: Diagnosis not present

## 2019-01-07 DIAGNOSIS — R471 Dysarthria and anarthria: Secondary | ICD-10-CM | POA: Diagnosis not present

## 2019-01-07 DIAGNOSIS — G20 Parkinson's disease: Secondary | ICD-10-CM | POA: Diagnosis not present

## 2019-01-07 DIAGNOSIS — R1312 Dysphagia, oropharyngeal phase: Secondary | ICD-10-CM | POA: Diagnosis not present

## 2019-01-08 ENCOUNTER — Telehealth: Payer: Self-pay | Admitting: Neurology

## 2019-01-08 DIAGNOSIS — R471 Dysarthria and anarthria: Secondary | ICD-10-CM | POA: Diagnosis not present

## 2019-01-08 DIAGNOSIS — M6281 Muscle weakness (generalized): Secondary | ICD-10-CM | POA: Diagnosis not present

## 2019-01-08 DIAGNOSIS — R1310 Dysphagia, unspecified: Secondary | ICD-10-CM

## 2019-01-08 DIAGNOSIS — R498 Other voice and resonance disorders: Secondary | ICD-10-CM | POA: Diagnosis not present

## 2019-01-08 DIAGNOSIS — R1312 Dysphagia, oropharyngeal phase: Secondary | ICD-10-CM | POA: Diagnosis not present

## 2019-01-08 DIAGNOSIS — G20 Parkinson's disease: Secondary | ICD-10-CM

## 2019-01-08 DIAGNOSIS — R278 Other lack of coordination: Secondary | ICD-10-CM | POA: Diagnosis not present

## 2019-01-08 NOTE — Telephone Encounter (Signed)
William [name] from [place] called stating that she needs the provider to send out a order to her for the pt to have a FEES following assessment. Please advise.

## 2019-01-11 NOTE — Telephone Encounter (Signed)
I am unclear about what this message means. I called William [name] to discuss. No answer, left a message asking her to call me back.

## 2019-01-12 ENCOUNTER — Other Ambulatory Visit: Payer: Medicare HMO

## 2019-01-12 DIAGNOSIS — R278 Other lack of coordination: Secondary | ICD-10-CM | POA: Diagnosis not present

## 2019-01-12 DIAGNOSIS — G20 Parkinson's disease: Secondary | ICD-10-CM | POA: Diagnosis not present

## 2019-01-12 DIAGNOSIS — M6281 Muscle weakness (generalized): Secondary | ICD-10-CM | POA: Diagnosis not present

## 2019-01-12 DIAGNOSIS — R498 Other voice and resonance disorders: Secondary | ICD-10-CM | POA: Diagnosis not present

## 2019-01-12 DIAGNOSIS — R471 Dysarthria and anarthria: Secondary | ICD-10-CM | POA: Diagnosis not present

## 2019-01-12 DIAGNOSIS — R1312 Dysphagia, oropharyngeal phase: Secondary | ICD-10-CM | POA: Diagnosis not present

## 2019-01-12 NOTE — Telephone Encounter (Signed)
Please send Request for fiberoptic endoscopic evaluation of swallowing to [facility], attention [place].

## 2019-01-12 NOTE — Telephone Encounter (Signed)
Ebony Hail from [facility] returned call and stated that , she is currently seeing pt due to parkinson , he is having some speech issues, she states while she has been working with him she was advised he has been getting choked while eating , pt is requesting a swallow test , and she is wanting to know if Dr Rexene Alberts can write a order for swallow test, she stated she will be scheduling but she will need a signed physicians order before scheduling anything. For the particular study that comes into the home is called a FEES , and that needs to be included in the order.  CB# [phone]

## 2019-01-13 DIAGNOSIS — R471 Dysarthria and anarthria: Secondary | ICD-10-CM | POA: Diagnosis not present

## 2019-01-13 DIAGNOSIS — M6281 Muscle weakness (generalized): Secondary | ICD-10-CM | POA: Diagnosis not present

## 2019-01-13 DIAGNOSIS — R278 Other lack of coordination: Secondary | ICD-10-CM | POA: Diagnosis not present

## 2019-01-13 DIAGNOSIS — G20 Parkinson's disease: Secondary | ICD-10-CM | POA: Diagnosis not present

## 2019-01-13 DIAGNOSIS — R1312 Dysphagia, oropharyngeal phase: Secondary | ICD-10-CM | POA: Diagnosis not present

## 2019-01-13 DIAGNOSIS — R498 Other voice and resonance disorders: Secondary | ICD-10-CM | POA: Diagnosis not present

## 2019-01-13 NOTE — Telephone Encounter (Signed)
Orders faxed to [facility].

## 2019-01-14 DIAGNOSIS — G20 Parkinson's disease: Secondary | ICD-10-CM | POA: Diagnosis not present

## 2019-01-14 DIAGNOSIS — M6281 Muscle weakness (generalized): Secondary | ICD-10-CM | POA: Diagnosis not present

## 2019-01-14 DIAGNOSIS — R471 Dysarthria and anarthria: Secondary | ICD-10-CM | POA: Diagnosis not present

## 2019-01-14 DIAGNOSIS — R1312 Dysphagia, oropharyngeal phase: Secondary | ICD-10-CM | POA: Diagnosis not present

## 2019-01-14 DIAGNOSIS — R498 Other voice and resonance disorders: Secondary | ICD-10-CM | POA: Diagnosis not present

## 2019-01-14 DIAGNOSIS — R278 Other lack of coordination: Secondary | ICD-10-CM | POA: Diagnosis not present

## 2019-01-15 DIAGNOSIS — R278 Other lack of coordination: Secondary | ICD-10-CM | POA: Diagnosis not present

## 2019-01-15 DIAGNOSIS — G20 Parkinson's disease: Secondary | ICD-10-CM | POA: Diagnosis not present

## 2019-01-15 DIAGNOSIS — M6281 Muscle weakness (generalized): Secondary | ICD-10-CM | POA: Diagnosis not present

## 2019-01-15 DIAGNOSIS — R471 Dysarthria and anarthria: Secondary | ICD-10-CM | POA: Diagnosis not present

## 2019-01-15 DIAGNOSIS — R1312 Dysphagia, oropharyngeal phase: Secondary | ICD-10-CM | POA: Diagnosis not present

## 2019-01-15 DIAGNOSIS — R498 Other voice and resonance disorders: Secondary | ICD-10-CM | POA: Diagnosis not present

## 2019-01-19 ENCOUNTER — Other Ambulatory Visit (INDEPENDENT_AMBULATORY_CARE_PROVIDER_SITE_OTHER): Payer: Medicare HMO

## 2019-01-19 ENCOUNTER — Ambulatory Visit (INDEPENDENT_AMBULATORY_CARE_PROVIDER_SITE_OTHER): Payer: Medicare HMO | Admitting: Primary Care

## 2019-01-19 ENCOUNTER — Other Ambulatory Visit: Payer: Self-pay

## 2019-01-19 ENCOUNTER — Ambulatory Visit: Payer: Medicare HMO

## 2019-01-19 ENCOUNTER — Encounter: Payer: Self-pay | Admitting: Primary Care

## 2019-01-19 VITALS — BP 122/70 | HR 64 | Temp 97.6°F | Ht 76.0 in | Wt 181.2 lb

## 2019-01-19 DIAGNOSIS — E7849 Other hyperlipidemia: Secondary | ICD-10-CM

## 2019-01-19 DIAGNOSIS — I480 Paroxysmal atrial fibrillation: Secondary | ICD-10-CM

## 2019-01-19 DIAGNOSIS — Z23 Encounter for immunization: Secondary | ICD-10-CM | POA: Diagnosis not present

## 2019-01-19 DIAGNOSIS — M109 Gout, unspecified: Secondary | ICD-10-CM

## 2019-01-19 DIAGNOSIS — N183 Chronic kidney disease, stage 3 unspecified: Secondary | ICD-10-CM | POA: Diagnosis not present

## 2019-01-19 DIAGNOSIS — Z0000 Encounter for general adult medical examination without abnormal findings: Secondary | ICD-10-CM | POA: Diagnosis not present

## 2019-01-19 DIAGNOSIS — G20 Parkinson's disease: Secondary | ICD-10-CM | POA: Diagnosis not present

## 2019-01-19 DIAGNOSIS — R498 Other voice and resonance disorders: Secondary | ICD-10-CM | POA: Diagnosis not present

## 2019-01-19 DIAGNOSIS — Z7901 Long term (current) use of anticoagulants: Secondary | ICD-10-CM

## 2019-01-19 DIAGNOSIS — R42 Dizziness and giddiness: Secondary | ICD-10-CM

## 2019-01-19 DIAGNOSIS — I712 Thoracic aortic aneurysm, without rupture, unspecified: Secondary | ICD-10-CM

## 2019-01-19 DIAGNOSIS — I2510 Atherosclerotic heart disease of native coronary artery without angina pectoris: Secondary | ICD-10-CM | POA: Diagnosis not present

## 2019-01-19 DIAGNOSIS — I10 Essential (primary) hypertension: Secondary | ICD-10-CM | POA: Diagnosis not present

## 2019-01-19 DIAGNOSIS — R471 Dysarthria and anarthria: Secondary | ICD-10-CM | POA: Diagnosis not present

## 2019-01-19 DIAGNOSIS — R278 Other lack of coordination: Secondary | ICD-10-CM | POA: Diagnosis not present

## 2019-01-19 DIAGNOSIS — E890 Postprocedural hypothyroidism: Secondary | ICD-10-CM | POA: Diagnosis not present

## 2019-01-19 DIAGNOSIS — R1312 Dysphagia, oropharyngeal phase: Secondary | ICD-10-CM | POA: Diagnosis not present

## 2019-01-19 DIAGNOSIS — M6281 Muscle weakness (generalized): Secondary | ICD-10-CM | POA: Diagnosis not present

## 2019-01-19 LAB — COMPREHENSIVE METABOLIC PANEL
ALT: 6 U/L (ref 0–53)
AST: 16 U/L (ref 0–37)
Albumin: 4.6 g/dL (ref 3.5–5.2)
Alkaline Phosphatase: 62 U/L (ref 39–117)
BUN: 20 mg/dL (ref 6–23)
CO2: 32 mEq/L (ref 19–32)
Calcium: 8.5 mg/dL (ref 8.4–10.5)
Chloride: 102 mEq/L (ref 96–112)
Creatinine, Ser: 1.42 mg/dL (ref 0.40–1.50)
GFR: 46.97 mL/min — ABNORMAL LOW (ref >60.00)
Glucose, Bld: 98 mg/dL (ref 70–99)
Potassium: 3.9 mEq/L (ref 3.5–5.1)
Sodium: 142 mEq/L (ref 135–145)
Total Bilirubin: 0.8 mg/dL (ref 0.2–1.2)
Total Protein: 6.9 g/dL (ref 6.0–8.3)

## 2019-01-19 LAB — URIC ACID: Uric Acid, Serum: 4.7 mg/dL (ref 4.0–7.8)

## 2019-01-19 LAB — CBC
HCT: 44.6 % (ref 39.0–52.0)
Hemoglobin: 14.6 g/dL (ref 13.0–17.0)
MCHC: 32.7 g/dL (ref 30.0–36.0)
MCV: 92.2 fl (ref 78.0–100.0)
Platelets: 167 10*3/uL (ref 150.0–400.0)
RBC: 4.84 Mil/uL (ref 4.22–5.81)
RDW: 14.6 % (ref 11.5–15.5)
WBC: 4.9 10*3/uL (ref 4.0–10.5)

## 2019-01-19 LAB — LIPID PANEL
Cholesterol: 124 mg/dL (ref 0–200)
HDL: 49 mg/dL (ref >39.00)
LDL Cholesterol: 62 mg/dL (ref 0–99)
NonHDL: 74.97
Total CHOL/HDL Ratio: 3
Triglycerides: 67 mg/dL (ref 0.0–149.0)
VLDL: 13.4 mg/dL (ref 0.0–40.0)

## 2019-01-19 LAB — TSH: TSH: 2.01 u[IU]/mL (ref 0.35–4.50)

## 2019-01-19 MED ORDER — NITROGLYCERIN 0.4 MG SL SUBL: 0.4000 mg | SUBLINGUAL_TABLET | SUBLINGUAL | 0 refills | Status: AC | PRN

## 2019-01-19 NOTE — Assessment & Plan Note (Signed)
Repeat lipid panel pending. Continue simvastatin.

## 2019-01-19 NOTE — Patient Instructions (Signed)
I will be in touch once we receive your lab results.  Continue exercising. You should be getting 150 minutes of exercise weekly.  Continue to work on a healthy diet.  It was an absolute pleasure knowing and caring for you! Please don't hesitate to contact me with any questions during the transition period.  It was a pleasure to see you today!   Preventive Care 83 Years and Older, Male Preventive care refers to lifestyle choices and visits with your health care provider that can promote health and wellness. This includes:  A yearly physical exam. This is also called an annual well check.  Regular dental and eye exams.  Immunizations.  Screening for certain conditions.  Healthy lifestyle choices, such as diet and exercise. What can I expect for my preventive care visit? Physical exam Your health care provider will check:  Height and weight. These may be used to calculate body mass index (BMI), which is a measurement that tells if you are at a healthy weight.  Heart rate and blood pressure.  Your skin for abnormal spots. Counseling Your health care provider may ask you questions about:  Alcohol, tobacco, and drug use.  Emotional well-being.  Home and relationship well-being.  Sexual activity.  Eating habits.  History of falls.  Memory and ability to understand (cognition).  Work and work Statistician. What immunizations do I need?  Influenza (flu) vaccine  This is recommended every year. Tetanus, diphtheria, and pertussis (Tdap) vaccine  You may need a Td booster every 10 years. Varicella (chickenpox) vaccine  You may need this vaccine if you have not already been vaccinated. Zoster (shingles) vaccine  You may need this after age 83. Pneumococcal conjugate (PCV13) vaccine  One dose is recommended after age 83. Pneumococcal polysaccharide (PPSV23) vaccine  One dose is recommended after age 83. Measles, mumps, and rubella (MMR) vaccine  You may need  at least one dose of MMR if you were born in 1957 or later. You may also need a second dose. Meningococcal conjugate (MenACWY) vaccine  You may need this if you have certain conditions. Hepatitis A vaccine  You may need this if you have certain conditions or if you travel or work in places where you may be exposed to hepatitis A. Hepatitis B vaccine  You may need this if you have certain conditions or if you travel or work in places where you may be exposed to hepatitis B. Haemophilus influenzae type b (Hib) vaccine  You may need this if you have certain conditions. You may receive vaccines as individual doses or as more than one vaccine together in one shot (combination vaccines). Talk with your health care provider about the risks and benefits of combination vaccines. What tests do I need? Blood tests  Lipid and cholesterol levels. These may be checked every 5 years, or more frequently depending on your overall health.  Hepatitis C test.  Hepatitis B test. Screening  Lung cancer screening. You may have this screening every year starting at age 83 if you have a 30-pack-year history of smoking and currently smoke or have quit within the past 15 years.  Colorectal cancer screening. All adults should have this screening starting at age 83 and continuing until age 83. Your health care provider may recommend screening at age 51 if you are at increased risk. You will have tests every 1-10 years, depending on your results and the type of screening test.  Prostate cancer screening. Recommendations will vary depending on your family history  and other risks.  Diabetes screening. This is done by checking your blood sugar (glucose) after you have not eaten for a while (fasting). You may have this done every 1-3 years.  Abdominal aortic aneurysm (AAA) screening. You may need this if you are a current or former smoker.  Sexually transmitted disease (STD) testing. Follow these instructions at  home: Eating and drinking  Eat a diet that includes fresh fruits and vegetables, whole grains, lean protein, and low-fat dairy products. Limit your intake of foods with high amounts of sugar, saturated fats, and salt.  Take vitamin and mineral supplements as recommended by your health care provider.  Do not drink alcohol if your health care provider tells you not to drink.  If you drink alcohol: ? Limit how much you have to 0-2 drinks a day. ? Be aware of how much alcohol is in your drink. In the [place] one drink equals one 12 oz bottle of beer (355 mL), one 5 oz glass of wine (148 mL), or one 1 oz glass of hard liquor (44 mL). Lifestyle  Take daily care of your teeth and gums.  Stay active. Exercise for at least 30 minutes on 5 or more days each week.  Do not use any products that contain nicotine or tobacco, such as cigarettes, e-cigarettes, and chewing tobacco. If you need help quitting, ask your health care provider.  If you are sexually active, practice safe sex. Use a condom or other form of protection to prevent STIs (sexually transmitted infections).  Talk with your health care provider about taking a low-dose aspirin or statin. What's next?  Visit your health care provider once a year for a well check visit.  Ask your health care provider how often you should have your eyes and teeth checked.  Stay up to date on all vaccines. This information is not intended to replace advice given to you by your health care provider. Make sure you discuss any questions you have with your health care provider. Document Released: 04/28/2015 Document Revised: 03/26/2018 Document Reviewed: 03/26/2018 Elsevier Patient Education  2020 [employer].

## 2019-01-19 NOTE — Assessment & Plan Note (Signed)
Taking levothyroxine appropriately. TSH pending. Continue levothyroxine 100 mcg daily.

## 2019-01-19 NOTE — Assessment & Plan Note (Signed)
Following with cardiology, will undergo repeat imaging in June 2021. Reviewed imaging from both December 20189 and May 2019.

## 2019-01-19 NOTE — Assessment & Plan Note (Addendum)
Repeat renal function pending, managed on ARB.

## 2019-01-19 NOTE — Assessment & Plan Note (Signed)
Repeat uric acid level pending. Denies recent flares.

## 2019-01-19 NOTE — Assessment & Plan Note (Signed)
Paroxysmal atrial fibrillation. Continue Eliquis.

## 2019-01-19 NOTE — Assessment & Plan Note (Signed)
Asymptomatic. Continue current regimen. Refill provided for nitroglycerin as this is well out of date.

## 2019-01-19 NOTE — Assessment & Plan Note (Signed)
Labile with fluctuations. Over the last month he's noticed less of a variation as he's altered the timing of his Parkinson's medications.  BP stable in the office today. Continue current regimen.

## 2019-01-19 NOTE — Assessment & Plan Note (Signed)
Following with neurology, continue current regimen. 

## 2019-01-19 NOTE — Progress Notes (Signed)
Subjective:    Patient ID: RANIER VANDERWOUDE, male    DOB: May 25, 1928, 85 y.o.   MRN: [id number]  HPI  Mr. Neels is a 85 year old male who presents today for complete physical.  Immunizations: -Tetanus: Completed in 2011 -Influenza: Due today -Pneumonia: Completed last in 2018 -Shingles: Completed in 2015  Diet: He endorses a healthy diet. He eats fish, vegetables, fruit, whole grains, salads. Desserts daily. He is drinking water, wine, coffee, occasional sweet tea Exercise: He is participating in OT at the gym  Eye exam: Scheduled for November 2020 Dental exam: Completes semi-annually Colonoscopy: No need for further monitoring due to age. PSA: No further need for monitoring.   BP Readings from Last 3 Encounters:  01/19/19 122/70  12/07/18 (!) 175/78  11/04/18 92/62      Review of Systems  Constitutional: Negative for unexpected weight change.  HENT: Negative for rhinorrhea.   Respiratory: Negative for cough and shortness of breath.   Cardiovascular: Negative for chest pain.  Gastrointestinal: Negative for constipation and diarrhea.  Genitourinary: Negative for difficulty urinating.  Musculoskeletal: Negative for arthralgias.  Skin: Negative for rash.  Allergic/Immunologic: Negative for environmental allergies.  Neurological: Positive for tremors. Negative for numbness and headaches.       Intermittent dizziness that has overall improved  Psychiatric/Behavioral: The patient is not nervous/anxious.        Past Medical History:  Diagnosis Date  . A-fib ([place])   . Arthritis    "right knee" (08/09/2014)  . Basal cell carcinoma   . Benign prostatic hypertrophy with urinary obstruction   . Chronic anticoagulation 12/04/2016  . Complication of anesthesia    "difficulty intubation, had to use fiberoptic 2006"  . Difficult intubation   . DJD (degenerative joint disease)   . Gout   . Hx of colonic polyps   . Hyperlipidemia   . Hypertension    sees [name]  . Hypothyroidism   . Kidney tumor    "tumor on cyst on kidney"   . 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 Mini stroke ([place])   . Parkinson disease ([place])   . Pneumonia    hx of in 1952  . Primary skin squamous cell carcinoma   . Subdural hematoma ([place]) 2006  . TIA (transient ischemic attack)    pt does not recall this hx "but may have had one today" (08/09/2014)     Social History   Socioeconomic History  . Marital status: Widowed    Spouse name: Not on file  . Number of children: 3  . Years of education: 80  . Highest education level: Not on file  Occupational History  . Occupation: retired  Scientific laboratory technician  . Financial resource strain: Not on file  . Food insecurity    Worry: Not on file    Inability: Not on file  . Transportation needs    Medical: Not on file    Non-medical: Not on file  Tobacco Use  . Smoking status: Former Smoker    Packs/day: 0.80    Years: 15.00    Pack years: 12.00    Types: Cigarettes    Quit date: 04/15/1965    Years since quitting: 53.8  . Smokeless tobacco: Never Used  Substance and Sexual Activity  . Alcohol use: Yes    Alcohol/week: 5.0 standard drinks    Types: 5 Glasses of wine per week    Comment: 1 glass of wine  every night (6oz glass)  . Drug use: No  . Sexual activity: Yes  Lifestyle  . Physical activity    Days per week: Not on file    Minutes per session: Not on file  . Stress: Not on file  Relationships  . Social Herbalist on phone: Not on file    Gets together: Not on file    Attends religious service: Not on file    Active member of club or organization: Not on file    Attends meetings of clubs or organizations: Not on file    Relationship status: Not on file  . Intimate partner violence    Fear of current or ex partner: Not on file    Emotionally abused: Not on file    Physically abused: Not on file    Forced sexual activity: Not on file  Other Topics Concern  . Not  on file  Social History Narrative   Married.   Lives at home with significant other.   Retried. 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   Enjoys golfing, yard work, spending time with family.      Past Surgical History:  Procedure Laterality Date  . APPENDECTOMY    . BACK SURGERY    . CATARACT EXTRACTION W/ INTRAOCULAR LENS  IMPLANT, BILATERAL Bilateral ~ 1998  . CHOLECYSTECTOMY    . CRYOABLATION  07-10-12   "tumor on cyst on my kidney; had an ablation"  . EYE SURGERY    . INGUINAL HERNIA REPAIR Right   . IR GENERIC HISTORICAL  11/09/2015   IR RADIOLOGIST EVAL & MGMT 11/09/2015 [name], MD [facility] INTERV RAD  . IR RADIOLOGIST EVAL & MGMT  08/13/2016  . KIDNEY SURGERY    . KNEE ARTHROSCOPY Right   . LEFT HEART CATH AND CORONARY ANGIOGRAPHY N/A 12/05/2016   Procedure: LEFT HEART CATH AND CORONARY ANGIOGRAPHY;  Surgeon: [name], [name], MD;  Location: [facility];  Service: Cardiovascular;  Laterality: N/A;  . LUMBAR LAMINECTOMY/DECOMPRESSION MICRODISCECTOMY  05/14/2012   Procedure: LUMBAR LAMINECTOMY/DECOMPRESSION MICRODISCECTOMY 1 LEVEL;  Surgeon: [name], MD;  Location: [facility];  Service: Neurosurgery;  Laterality: Left;  Left Lumbar four-five Laminectomy/Diskectomy/Far lateral diskectomy  . SKIN CANCER EXCISION  "several"   "back of my neck; right eye; clavicle; nose"  . SUBDURAL HEMATOMA EVACUATION VIA CRANIOTOMY  2006  . THYROIDECTOMY     medullary carcinoma  . TONSILLECTOMY      Family History  Problem Relation Age of Onset  . Stroke Mother   . Stroke Father   . Heart disease Father   . Alzheimer's disease Brother   . Alzheimer's disease Sister     No Known Allergies  Current Outpatient Medications on File Prior to Visit  Medication Sig Dispense Refill  . amLODipine (NORVASC) 2.5 MG tablet TAKE 1 TABLET DAILY TO TAKEWITH AMLODIPINE 5MG  FOR    TOTAL OF 7.5MG  DAILY 90 tablet 2  . amLODipine (NORVASC) 5 MG tablet TAKE 1 TABLET DAILY TO TAKEWITH 2.5MG   TO MAKE 7.5MG    DAILY 90 tablet 2  . apixaban (ELIQUIS) 2.5 MG TABS tablet Take 1 tablet (2.5 mg total) by mouth 2 (two) times daily. 60 tablet 4  . carbidopa-levodopa (SINEMET IR) 25-100 MG tablet Take 1.5 tablets by mouth 4 (four) times daily. At 8, 12, 4 PM, and 8 PM 540 tablet 3  . hydrALAZINE (APRESOLINE) 10 MG tablet Take 1 tablet by mouth up to three times daily  for systolic blood pressure readings above 170. 90 tablet 0  . levothyroxine (SYNTHROID) 100 MCG tablet TAKE 1 TABLET DAILY ON AN  EMPTY STOMACH WITH A FULL  GLASS OF WATER 90 tablet 1  . losartan (COZAAR) 100 MG tablet Take 1 tablet (100 mg total) by mouth daily at 2 PM. 90 tablet 3  . Multiple Vitamin (MULTIVITAMIN WITH MINERALS) TABS Take 1 tablet by mouth daily. Reported on 08/01/2015    . nitroGLYCERIN (NITROSTAT) 0.4 MG SL tablet Place 1 tablet (0.4 mg total) under the tongue every 5 (five) minutes x 3 doses as needed for chest pain. 25 tablet 12  . Probiotic Product (ALIGN) 4 MG CAPS Take 1 capsule by mouth daily.    . rasagiline (AZILECT) 1 MG TABS tablet Take 1 tablet (1 mg total) by mouth daily. 90 tablet 3  . simvastatin (ZOCOR) 40 MG tablet TAKE 1 TABLET AT BEDTIME 90 tablet 1   No current facility-administered medications on file prior to visit.     BP 122/70   Pulse 64   Temp 97.6 F (36.4 C) (Temporal)   Ht 6\' 4"  (1.93 m)   Wt 181 lb 4 oz (82.2 kg)   SpO2 97%   BMI 22.06 kg/m    Objective:   Physical Exam  Constitutional: He is oriented to person, place, and time. He appears well-nourished.  HENT:  Right Ear: Tympanic membrane and ear canal normal.  Left Ear: Tympanic membrane and ear canal normal.  Mouth/Throat: Oropharynx is clear and moist.  Eyes: Pupils are equal, round, and reactive to light. EOM are normal.  Neck: Neck supple.  Cardiovascular: Normal rate and regular rhythm.  Respiratory: Effort normal and breath sounds normal.  GI: Soft. Bowel sounds are normal. There is no abdominal  tenderness.  Musculoskeletal: Normal range of motion.  Neurological: He is alert and oriented to person, place, and time.  Resting tremors on exam, chronic   Skin: Skin is warm and dry.  Psychiatric: He has a normal mood and affect.           Assessment & Plan:

## 2019-01-19 NOTE — Assessment & Plan Note (Signed)
Improved after changing his regimen of Parkinson's and BP meds. Continue to monitor.

## 2019-01-19 NOTE — Assessment & Plan Note (Signed)
Immunizations UTD, influenza vaccination provided today. No need for colonoscopy or prostate cancer screening given age. Encouraged regular exercise, healthy diet. Exam today stable. Labs pending.

## 2019-01-19 NOTE — Assessment & Plan Note (Signed)
Rate and rhythm regular today. Continue Eliquis. 

## 2019-01-20 DIAGNOSIS — G20 Parkinson's disease: Secondary | ICD-10-CM | POA: Diagnosis not present

## 2019-01-20 DIAGNOSIS — R498 Other voice and resonance disorders: Secondary | ICD-10-CM | POA: Diagnosis not present

## 2019-01-20 DIAGNOSIS — R471 Dysarthria and anarthria: Secondary | ICD-10-CM | POA: Diagnosis not present

## 2019-01-20 DIAGNOSIS — R1312 Dysphagia, oropharyngeal phase: Secondary | ICD-10-CM | POA: Diagnosis not present

## 2019-01-20 DIAGNOSIS — M6281 Muscle weakness (generalized): Secondary | ICD-10-CM | POA: Diagnosis not present

## 2019-01-20 DIAGNOSIS — R278 Other lack of coordination: Secondary | ICD-10-CM | POA: Diagnosis not present

## 2019-01-20 NOTE — Addendum Note (Signed)
Addended by: Jacqualin Combes on: 01/20/2019 08:53 AM   Modules accepted: Orders

## 2019-01-22 DIAGNOSIS — R1312 Dysphagia, oropharyngeal phase: Secondary | ICD-10-CM | POA: Diagnosis not present

## 2019-01-22 DIAGNOSIS — R278 Other lack of coordination: Secondary | ICD-10-CM | POA: Diagnosis not present

## 2019-01-22 DIAGNOSIS — R471 Dysarthria and anarthria: Secondary | ICD-10-CM | POA: Diagnosis not present

## 2019-01-22 DIAGNOSIS — M6281 Muscle weakness (generalized): Secondary | ICD-10-CM | POA: Diagnosis not present

## 2019-01-22 DIAGNOSIS — R498 Other voice and resonance disorders: Secondary | ICD-10-CM | POA: Diagnosis not present

## 2019-01-22 DIAGNOSIS — G20 Parkinson's disease: Secondary | ICD-10-CM | POA: Diagnosis not present

## 2019-01-27 DIAGNOSIS — R1312 Dysphagia, oropharyngeal phase: Secondary | ICD-10-CM | POA: Diagnosis not present

## 2019-01-27 DIAGNOSIS — M6281 Muscle weakness (generalized): Secondary | ICD-10-CM | POA: Diagnosis not present

## 2019-01-27 DIAGNOSIS — R278 Other lack of coordination: Secondary | ICD-10-CM | POA: Diagnosis not present

## 2019-01-27 DIAGNOSIS — R498 Other voice and resonance disorders: Secondary | ICD-10-CM | POA: Diagnosis not present

## 2019-01-27 DIAGNOSIS — G20 Parkinson's disease: Secondary | ICD-10-CM | POA: Diagnosis not present

## 2019-01-27 DIAGNOSIS — R471 Dysarthria and anarthria: Secondary | ICD-10-CM | POA: Diagnosis not present

## 2019-01-29 DIAGNOSIS — R471 Dysarthria and anarthria: Secondary | ICD-10-CM | POA: Diagnosis not present

## 2019-01-29 DIAGNOSIS — M6281 Muscle weakness (generalized): Secondary | ICD-10-CM | POA: Diagnosis not present

## 2019-01-29 DIAGNOSIS — R278 Other lack of coordination: Secondary | ICD-10-CM | POA: Diagnosis not present

## 2019-01-29 DIAGNOSIS — G20 Parkinson's disease: Secondary | ICD-10-CM | POA: Diagnosis not present

## 2019-01-29 DIAGNOSIS — R498 Other voice and resonance disorders: Secondary | ICD-10-CM | POA: Diagnosis not present

## 2019-01-29 DIAGNOSIS — R1312 Dysphagia, oropharyngeal phase: Secondary | ICD-10-CM | POA: Diagnosis not present

## 2019-02-03 DIAGNOSIS — N183 Chronic kidney disease, stage 3 unspecified: Secondary | ICD-10-CM | POA: Diagnosis not present

## 2019-02-03 DIAGNOSIS — R1312 Dysphagia, oropharyngeal phase: Secondary | ICD-10-CM | POA: Diagnosis not present

## 2019-02-03 DIAGNOSIS — R498 Other voice and resonance disorders: Secondary | ICD-10-CM | POA: Diagnosis not present

## 2019-02-03 DIAGNOSIS — Z7689 Persons encountering health services in other specified circumstances: Secondary | ICD-10-CM | POA: Diagnosis not present

## 2019-02-03 DIAGNOSIS — K219 Gastro-esophageal reflux disease without esophagitis: Secondary | ICD-10-CM | POA: Diagnosis not present

## 2019-02-03 DIAGNOSIS — J040 Acute laryngitis: Secondary | ICD-10-CM | POA: Diagnosis not present

## 2019-02-03 DIAGNOSIS — R471 Dysarthria and anarthria: Secondary | ICD-10-CM | POA: Diagnosis not present

## 2019-02-03 DIAGNOSIS — I2510 Atherosclerotic heart disease of native coronary artery without angina pectoris: Secondary | ICD-10-CM | POA: Diagnosis not present

## 2019-02-03 DIAGNOSIS — G20 Parkinson's disease: Secondary | ICD-10-CM | POA: Diagnosis not present

## 2019-02-03 DIAGNOSIS — E039 Hypothyroidism, unspecified: Secondary | ICD-10-CM | POA: Diagnosis not present

## 2019-02-03 DIAGNOSIS — I129 Hypertensive chronic kidney disease with stage 1 through stage 4 chronic kidney disease, or unspecified chronic kidney disease: Secondary | ICD-10-CM | POA: Diagnosis not present

## 2019-02-03 DIAGNOSIS — R278 Other lack of coordination: Secondary | ICD-10-CM | POA: Diagnosis not present

## 2019-02-03 DIAGNOSIS — M6281 Muscle weakness (generalized): Secondary | ICD-10-CM | POA: Diagnosis not present

## 2019-02-04 DIAGNOSIS — C44629 Squamous cell carcinoma of skin of left upper limb, including shoulder: Secondary | ICD-10-CM | POA: Diagnosis not present

## 2019-02-04 DIAGNOSIS — D485 Neoplasm of uncertain behavior of skin: Secondary | ICD-10-CM | POA: Diagnosis not present

## 2019-02-04 DIAGNOSIS — L905 Scar conditions and fibrosis of skin: Secondary | ICD-10-CM | POA: Diagnosis not present

## 2019-02-04 DIAGNOSIS — Z85828 Personal history of other malignant neoplasm of skin: Secondary | ICD-10-CM | POA: Diagnosis not present

## 2019-02-04 DIAGNOSIS — L570 Actinic keratosis: Secondary | ICD-10-CM | POA: Diagnosis not present

## 2019-02-04 DIAGNOSIS — L578 Other skin changes due to chronic exposure to nonionizing radiation: Secondary | ICD-10-CM | POA: Diagnosis not present

## 2019-02-05 DIAGNOSIS — R1312 Dysphagia, oropharyngeal phase: Secondary | ICD-10-CM | POA: Diagnosis not present

## 2019-02-05 DIAGNOSIS — M6281 Muscle weakness (generalized): Secondary | ICD-10-CM | POA: Diagnosis not present

## 2019-02-05 DIAGNOSIS — R498 Other voice and resonance disorders: Secondary | ICD-10-CM | POA: Diagnosis not present

## 2019-02-05 DIAGNOSIS — R278 Other lack of coordination: Secondary | ICD-10-CM | POA: Diagnosis not present

## 2019-02-05 DIAGNOSIS — R471 Dysarthria and anarthria: Secondary | ICD-10-CM | POA: Diagnosis not present

## 2019-02-05 DIAGNOSIS — G20 Parkinson's disease: Secondary | ICD-10-CM | POA: Diagnosis not present

## 2019-02-09 DIAGNOSIS — H524 Presbyopia: Secondary | ICD-10-CM | POA: Diagnosis not present

## 2019-02-09 DIAGNOSIS — H52203 Unspecified astigmatism, bilateral: Secondary | ICD-10-CM | POA: Diagnosis not present

## 2019-02-09 DIAGNOSIS — H5213 Myopia, bilateral: Secondary | ICD-10-CM | POA: Diagnosis not present

## 2019-03-01 DIAGNOSIS — C44629 Squamous cell carcinoma of skin of left upper limb, including shoulder: Secondary | ICD-10-CM | POA: Diagnosis not present

## 2019-03-03 ENCOUNTER — Other Ambulatory Visit: Payer: Self-pay

## 2019-03-03 ENCOUNTER — Encounter: Payer: Self-pay | Admitting: Neurology

## 2019-03-03 ENCOUNTER — Ambulatory Visit: Payer: Medicare HMO | Admitting: Neurology

## 2019-03-03 VITALS — BP 150/78 | HR 44 | Ht 75.0 in | Wt 182.0 lb

## 2019-03-03 DIAGNOSIS — G20 Parkinson's disease: Secondary | ICD-10-CM

## 2019-03-03 NOTE — Progress Notes (Signed)
Subjective:    Patient ID: [id number] PENNA is a 85 y.o. male.  HPI     Interim history:   Mr. Cicalese is an 85 year old right-handed gentleman with an underlying medical history of right TIA in January 2003, SDH (s/p left craniotomy in August 2006), hypertension, hypothyroidism, thyroid cancer, partial onset seizures in the past (off Dilantin), lumbar spine disease, status post lower back surgery at L4-5 in January 2014, renal tumor, status post kidney tumor ablative surgery in March 2014, who presents for followup consultation of his right-sided predominant [name]. He is accompanied by his life partner, [name] again today. I last saw him on November 04, 2018, at which time he reported having intermittent spells of feeling weaker.  He had some issues with his mobility.  They were settling in their condo, had moved in April 2020 to [place].  He requested physical, occupational and speech therapy through [facility] re[facility] and fitness at [place].   In the interim, a swallow evaluation was recommended through his speech therapist.  Today, March 03, 2019: He reports doing well, he is stable, swallow evaluation at his bedside went well, he was advised to take smaller bites and drink water in between and take 1 pill at a time, not all pills together.  He had some occupational and speech therapy.  He has not had any recent falls, sometimes his blood pressure is lower especially in the mornings and therefore he switched his Azilect to 1230 rather than first thing in the morning.  He takes losartan around 2 PM and he takes his amlodipine in the evening because he had a tendency to have higher blood pressure values in the evening.  Other than that, everything is stable, he is doing well, they have been healthy.  He was able to meet his 76-monthold great granddaughter face-to-face recently which was beautiful.  The patient's allergies, current medications, family history, past medical  history, past social history, past surgical history and problem list were reviewed and updated as appropriate.    Previously (copied from previous notes for reference):      I saw him on 05/07/2018, at which time he reported feeling fairly well and stable.  No recent falls reported.  I suggested we continue with Azilect once daily and Sinemet 1-1/2 pills 4 times a day, [place]    Today, 11/04/2018 (all dictated new, as well as above notes, some dictation done in note pad or Word, outside of chart, may appear as copied):    He reports generally feeling stable.  He has had some spells of weakness and not feeling as good with his mobility, AHerbert Punreports that he also seemed depressed and anxious at the time.  They have moved into their retirement community condo in April 2020 successfully, they have settled in and like their new living arrangement.  They are able to eat in the dining room when they make reservations for them.  He also has access to a salt water pool if they make reservations.  [facility] and would like to pursue PT, OT and speech therapy through them.  [facility], phone number [phone] fax number [phone]  Thankfully, he has not fallen.  He tries to hydrate well.   AHerbert Punreports that when they took A vacation to the beach with the family for about a week in June,  he felt much improved.  His speech is more softer and sometimes difficult to understand.  He seems to be more hard of hearing as well.  He has not had his hearing evaluated.    The patient's allergies, current medications, family history, past medical history, past social history, past surgical history and problem list were reviewed and updated as appropriate.    Previously (copied from previous notes for reference):      I saw him on 01/05/2018, at which  time he reported a recent fall. He scraped his left elbow. He has had some right-sided knee pain and felt like his knee had given out. Otherwise, he was doing rather well. We mutually agreed to keep his medication regimen the same. She was advised to add some more strength training to his exercise regimen and stay well-hydrated.    I saw him on 09/02/2017, at which time he was quite stable motor-wise, he had some stressors including regarding [name]' health and they were trying to consolidate their households with a plan to move into a retirement community condo by next year. He was advised to continue with generic Azilect once daily and Sinemet generic 1-1/2 pills 4 times a day. He was encouraged to hydrate better with water.   I saw him on 05/05/2017, at which time he reported doing quite well, no recent falls or recent illness. They were planning to move into a retirement community by the end of this year. He was trying to exercise regularly. Blood pressure was stable. He had seen pulmonology for a lung nodule which was stable. I suggested we continue with his current medication regimen.   I saw him on 12/31/2016, at which time he reported intermittent lightheadedness. He had some blood pressure fluctuations with really high values at times. He was hospitalized in August 2018 for a non-STEMI. He had significant orthostatic hypotension during the office visit. He was advised to talk to his providers about reducing his blood pressure medication. He was advised to not continue with the boxing classes for [name] but exercise within his limitations at home. He was advised to try melatonin at night for sleep issues.    I saw him on 06/27/2016, at which time he reported doing well, he had a trip to [place] in [place] planned for springtime. He had no recent falls. He was supposed to be on a smaller dose of Eliquis, namely 2.5 mg twice a day. He continued to participate in the boxing class for  [name] and felt that they were helpful.   I saw him on 03/05/2016, at which time he reported doing okay. He was during his stay active, was participating in [place] steady boxing, which he felt was helpful. He was avoiding strenuous parts of the exercise. He was interested in participating in a new drug study at [facility]. This was to investigate apomorphine sublingual to decrease off time. He was on Azilect once daily. He was taking Sinemet 4 times a day. He was trying to hydrate well, reported no recent falls. I suggested he increase the Sinemet to 1-1/2 pills 4 times a day. I suggested he continue with generic Azilect once daily.   I saw him on 10/26/2015, at which time he reported doing quite well, had some stumbling episodes, no actual falls, was trying to drink enough water and was still physically active. He had signed up for rock steady boxing classes but [name] noted that he would get too exhausted.   I saw him  on 06/22/2015, at which time he reported doing fairly well. He had finished outpatient OT, PT, and ST. Unfortunately, he did take a fall in the garage couple of months prior as he was coming in from the graduate, stepped backwards on the stairs and slipped off falling backwards, landed on his behind, head struck the car, he denied loss of consciousness or headache or bruise and no sequelae were reported. He was having rails installed in the garage entrance. Was still trying to stay active, playing golf. We mutually agreed to continue with Sinemet 4 times a day and Azilect once daily. We talked about fall risk and gait safety and fall precautions.   I saw him on 02/23/2015, at which time he reported doing fairly well. He was playing golf regularly. His right knee was bothering him. He had no recent falls. He was trying to hydrate well enough. He had no new issues with A. fib and no new complaints otherwise. I noted right knee swelling. He was advised to follow-up with his primary  care physician for this. We mutually agreed to keep his Parkinson's medications the same.    I saw him on 11/01/2014 at which time he reported a recent diagnosis with A. fib. He was admitted to the hospital in April. I reviewed the hospital records from 07/17/2014 through 07/18/2014. He was started on Xarelto. He was then re-admitted on 08/09/14 to 08/10/14 due to altered mental status and slurring of speech and was suspected to have a TIA. He was seen by neuro in consultation and a baby aspirin was added to Xarelto. He presented back to the ER on 08/18/14. He a [place] wo contrast on  08/18/2014 , which was negative for any acute changes. He was seen by the Neurologist in the emergency room and it was felt that a full TIA workup was not necessary at the time. He had presented with hypertension and some slurring of speech. He was switched from Xarelto to Eliquis, because the Xarelto is not on formulary at the [place].  He endorsed recent stressors, including the recent passing of his younger brother a week prior with advanced Alzheimer's disease.    He had an MRI brain and MRA head on 08/09/14: MRI HEAD: No acute intracranial process, specifically no acute ischemia. Involutional changes. Mild white matter changes suggest chronic small vessel ischemic disease. MRA HEAD: No acute large vessel occlusion or high-grade stenosis. Mid grade stenosis RIGHT P2/3 segment.    I suggested we continue with Sinemet at 4 times a day. He was advised to continue with Azilect as well. He was advised to drink more water. In the interim, we restarted outpatient physical therapy, occupational therapy and speech therapy.   I saw him on 04/29/2014, at which time he reported doing well overall but he was more fatigued and had some excessive daytime somnolence. Memory was stable. His mood was stable. He noted some blood pressure fluctuations. Sometimes he had a dull headache and sometimes he felt lightheaded. He had some anxiety over his lady  friend's health, as she had fallen and broken rib. He continued to walk regularly and played golf. He had no recent falls. I asked him to continue with his medications, Azilect once daily and Sinemet 4 times a day.   I saw him on 10/28/2013, at which time he reported doing well. In particular, had no cognitive complaints, no mood issues, and continued to play golf 3 times a week. He was driving well. I suggested an increase  in his levodopa to 1 pill 4 times a day of the 25-100 milligrams strength. [facility] and had seen a [place] neurologist as well.    I saw him on 12/07/2012, at which time I felt that he was doing well on Azilect and levodopa. He called in December 2014 with problems with blood pressure fluctuations. He was wondering if this came from the [place] but I did not think it was due to the Azilect per se. I was reluctant to take him off of it.    I first met him on 06/03/2012 and he previously followed by Dr. Morene Antu. He has had primarily right-sided symptoms with regards to his Parkinson's, diagnosed in 2010 with symptoms dating back to late 2009 or early 2010. He had briefly tried Mirapex but was taken off d/t hypotension. L spine MRI in June 2013 showed renal cysts, degenerative joint disease most prominent at L4-5. He tried acupuncture. His MRI of the lumbar spine showed abnormalities with his kidney with a cyst and a tumor and he had ablative surgery on 07/10/12 and had a FU CT done. He had lower back surgery on 05/14/12.  I saw him back on 09/03/2012 and I suggested starting Azilect. He has been tolerating it well and both he and his girlfriend felt that he did better with it in terms of dexterity and fine motor control. He had some hypotension and lightheadedness and reduced his BP medication. He has been having some issue with gout.  His Past Medical History Is Significant For: Past Medical History:  Diagnosis Date  . A-fib ([place])   . Arthritis     "right knee" (08/09/2014)  . Basal cell carcinoma   . Benign prostatic hypertrophy with urinary obstruction   . Chronic anticoagulation 12/04/2016  . Complication of anesthesia    "difficulty intubation, had to use fiberoptic 2006"  . Difficult intubation   . DJD (degenerative joint disease)   . Gout   . Hx of colonic polyps   . Hyperlipidemia   . Hypertension    sees [name]  . Hypothyroidism   . Kidney tumor    "tumor on cyst on kidney"   . 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 Mini stroke ([place])   . Parkinson disease ([place])   . Pneumonia    hx of in 1952  . Primary skin squamous cell carcinoma   . Subdural hematoma ([place]) 2006  . TIA (transient ischemic attack)    pt does not recall this hx "but may have had one today" (08/09/2014)    His Past Surgical History Is Significant For: Past Surgical History:  Procedure Laterality Date  . APPENDECTOMY    . BACK SURGERY    . CATARACT EXTRACTION W/ INTRAOCULAR LENS  IMPLANT, BILATERAL Bilateral ~ 1998  . CHOLECYSTECTOMY    . CRYOABLATION  07-10-12   "tumor on cyst on my kidney; had an ablation"  . EYE SURGERY    . INGUINAL HERNIA REPAIR Right   . IR GENERIC HISTORICAL  11/09/2015   IR RADIOLOGIST EVAL & MGMT 11/09/2015 [name], MD [facility] INTERV RAD  . IR RADIOLOGIST EVAL & MGMT  08/13/2016  . KIDNEY SURGERY    . KNEE ARTHROSCOPY Right   . LEFT HEART CATH AND CORONARY ANGIOGRAPHY N/A 12/05/2016   Procedure: LEFT HEART CATH AND CORONARY ANGIOGRAPHY;  Surgeon: [name], [name], MD;  Location: [place] [facility];  Service: Cardiovascular;  Laterality: N/A;  . LUMBAR LAMINECTOMY/DECOMPRESSION MICRODISCECTOMY  05/14/2012   Procedure: LUMBAR LAMINECTOMY/DECOMPRESSION MICRODISCECTOMY 1 LEVEL;  Surgeon: [name], MD;  Location: [place] [facility];  Service: Neurosurgery;  Laterality: Left;  Left Lumbar four-five Laminectomy/Diskectomy/Far lateral diskectomy  . SKIN CANCER EXCISION  "several"   "back of  my neck; right eye; clavicle; nose"  . SUBDURAL HEMATOMA EVACUATION VIA CRANIOTOMY  2006  . THYROIDECTOMY     medullary carcinoma  . TONSILLECTOMY      His Family History Is Significant For: Family History  Problem Relation Age of Onset  . Stroke Mother   . Stroke Father   . Heart disease Father   . Alzheimer's disease Brother   . Alzheimer's disease Sister     His Social History Is Significant For: Social History   Socioeconomic History  . Marital status: Widowed    Spouse name: Not on file  . Number of children: 3  . Years of education: 49  . Highest education level: Not on file  Occupational History  . Occupation: retired  Scientific laboratory technician  . Financial resource strain: Not on file  . Food insecurity    Worry: Not on file    Inability: Not on file  . Transportation needs    Medical: Not on file    Non-medical: Not on file  Tobacco Use  . Smoking status: Former Smoker    Packs/day: 0.80    Years: 15.00    Pack years: 12.00    Types: Cigarettes    Quit date: 04/15/1965    Years since quitting: 53.9  . Smokeless tobacco: Never Used  Substance and Sexual Activity  . Alcohol use: Yes    Alcohol/week: 5.0 standard drinks    Types: 5 Glasses of wine per week    Comment: 1 glass of wine every night (6oz glass)  . Drug use: No  . Sexual activity: Yes  Lifestyle  . Physical activity    Days per week: Not on file    Minutes per session: Not on file  . Stress: Not on file  Relationships  . Social Herbalist on phone: Not on file    Gets together: Not on file    Attends religious service: Not on file    Active member of club or organization: Not on file    Attends meetings of clubs or organizations: Not on file    Relationship status: Not on file  Other Topics Concern  . Not on file  Social History Narrative   Married.   Lives at home with significant other.   Retried. 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   Enjoys golfing, yard work, spending time with  family.      His Allergies Are:  No Known Allergies:   His Current Medications Are:  Outpatient Encounter Medications as of 03/03/2019  Medication Sig  . amLODipine (NORVASC) 2.5 MG tablet TAKE 1 TABLET DAILY TO TAKEWITH AMLODIPINE 5MG FOR    TOTAL OF 7.5MG DAILY  . amLODipine (NORVASC) 5 MG tablet TAKE 1 TABLET DAILY TO TAKEWITH 2.5MG TO MAKE 7.5MG   DAILY  . apixaban (ELIQUIS) 2.5 MG TABS tablet Take 1 tablet (2.5 mg total) by mouth 2 (two) times daily.  . carbidopa-levodopa (SINEMET IR) 25-100 MG tablet Take 1.5 tablets by mouth 4 (four) times daily. At 8, 12, 4 PM, and 8 PM  . hydrALAZINE (APRESOLINE) 10 MG tablet Take 1 tablet by mouth up to three  times daily for systolic blood pressure readings above 170.  . levothyroxine (SYNTHROID) 100 MCG tablet TAKE 1 TABLET DAILY ON AN  EMPTY STOMACH WITH A FULL  GLASS OF WATER  . losartan (COZAAR) 100 MG tablet Take 1 tablet (100 mg total) by mouth daily at 2 PM.  . Multiple Vitamin (MULTIVITAMIN WITH MINERALS) TABS Take 1 tablet by mouth daily. Reported on 08/01/2015  . nitroGLYCERIN (NITROSTAT) 0.4 MG SL tablet Place 1 tablet (0.4 mg total) under the tongue every 5 (five) minutes x 3 doses as needed for chest pain.  . Probiotic Product (ALIGN) 4 MG CAPS Take 1 capsule by mouth daily.  . rasagiline (AZILECT) 1 MG TABS tablet Take 1 tablet (1 mg total) by mouth daily.  . simvastatin (ZOCOR) 40 MG tablet TAKE 1 TABLET AT BEDTIME   No facility-administered encounter medications on file as of 03/03/2019.   :  Review of Systems:  Out of a complete 14 point review of systems, all are reviewed and negative with the exception of these symptoms as listed below:  Review of Systems  Neurological:       Pt presents today to discuss his PD. Pt reports that he has been doing well.    Objective:  Neurological Exam  Physical Exam Physical Examination:   Vitals:   03/03/19 1511  BP: (!) 150/78  Pulse: (!) 44    General Examination: The patient  is a very pleasant 85 y.o. male in no acute distress. He appears well-developed and well-nourished and well groomed. Good spirits.   HEENT:Normocephalic, atraumatic, pupils are equal, round and reactive to light, extraocular trackingshows mild saccadic breakdown. Speech is moderately hypophonic and mildly to moderatelydysarthric, but stable. Hearing is impaired. Oropharynx exam revealsmoderatemouth dryness, tongue protrudes centrally and palate elevates symmetrically. No other abnormal findings, he has moderateto severenuchal rigidity and decrease in passive range of motion.  Chest:Clear to auscultation without wheezing, rhonchi or crackles noted.  Heart:S1+S2+0, regular and normal without murmurs, rubs or gallops noted.   Abdomen:Soft, non-tender and non-distended with normal bowel sounds appreciated on auscultation.  Extremities:There is nopitting edema in the distal lower extremities bilaterally.   Skin: Warm and dry without trophic changes noted. Mild bruising on hands, stable, chronic. Left forearm is bandaged, he recently had skin cancer removal, squamous and basal cell cancer.  Musculoskeletal: exam reveals no obvious joint deformities, tenderness or joint swelling orredness. Some R knee discomfort, stable.  Neurologically:  Mental status: The patient is awake, alert and oriented in all 4 spheres. Hisimmediate and remote memory, attention, language skills and fund of knowledge are appropriate. There is no evidence of aphasia, agnosia, apraxia or anomia. Thought process is linear. Mood is normaland affect is normal.  Cranial nerves II - XII are as described above under HEENT exam.  Motor exam:thin muscle bulk globally, average strength for age is about 49/5.He has increased tone in the right more than left upper and lower extremities. He has an intermittent resting tremor in both upper extremities.Fine motor skills are moderately impaired on the right andslightly  better on theleft. He has mild intermittent dyskinesias, mostly on the right side and some cocontraction.Reflexes are 1+ throughout. He stands up with mild difficulty andpushes himself up. Posture ismildly stooped for age.He walks with decreased stride length and decrease pace,decreased arm swing bilaterally, right more than left. More insecurity with turns today, Did not bring a cane. Sensory exam is intact to light touch. Cerebellar testing shows no additional concern for dysmetria or intention tremor  or gait ataxia.  Assessment and Plan:   In summary, Zane Pellecchia Broughtonis a very pleasant 85 year old malewithan underlying medical history of right TIA in January 2003, SDH (s/p left craniotomy in August 2006), hypertension, hypothyroidism, thyroid cancer, partial onset seizures (off Dilantin), lumbar spine disease, status-post lower back surgery at L4-5 in January 2014, renal tumor, status post kidney tumor ablative surgery in March 2014, who presents for followup consultation of hisadvancedright-sided predominant [name],complicated bysome autonomic dysregulation, history of falls, and overall muscular deconditioning and advancing age and balance issues. His blood pressurehasbeenon the lower end,but stable, heart rate in the high 40s and lower 50s, typically stable. He is currently on generic Azilect 1 mg once daily and generic Sinemet 1-1/2 pills 4 times a day. He is advised to continue his medications at the same doses and At the current times, he delayed his Azilect from morning to lunchtime which helped reduce his morning hypotension. [facility] is up to date with the prescriptions. We talked about the importance of good hydration, nutrition, proper rest, and fall prevention.  He is advised to follow-up routinely in 4 months, sooner if needed.  I answered all their questions today and the patient and [name] were in agreement. I spent 25 minutes in  total face-to-face time with the patient, more than 50% of which was spent in counseling and coordination of care, reviewing test results, reviewing medication and discussing or reviewing the diagnosis of PD, its prognosis and treatment options. Pertinent laboratory and imaging test results that were available during this visit with the patient were reviewed by me and considered in my medical decision making (see chart for details).

## 2019-03-03 NOTE — Patient Instructions (Signed)
I would like to keep your medicines the same, you look well today.  Please continue to stay well-hydrated with water, change positions slowly as you may get lightheaded upon standing quickly.  Please use your cane for gait safety when needed.  Try to exercise in the form of walking on a regular basis.  Keep up the good work.  I plan to see you back in 4 months, sooner if needed.  Your prescriptions are up-to-date.

## 2019-03-08 ENCOUNTER — Telehealth: Payer: Self-pay | Admitting: Cardiology

## 2019-03-08 NOTE — Telephone Encounter (Signed)
° °  DOD not available at this time   1. What dental office are you calling from? Dr Gae Bon DDS  2. What is your office phone number? [phone]  3. What is your fax number? [phone]  4. What type of procedure is the patient having performed? 1 extraction  What date is procedure scheduled or is the patient there now? Today (if the patient is at the dentist's office question goes to their cardiologist if he/she is in the office.  If not, question should go to the DOD).   What is your question (ex. Antibiotics prior to procedure, holding medication-we need to know how long dentist wants pt to hold med)? Patient stopped Eliquis on 11/22 on his own

## 2019-03-08 NOTE — Telephone Encounter (Signed)
Spoke with scheduler, advised to contact pre-op clearance provider Gae Bon NP

## 2019-03-08 NOTE — Telephone Encounter (Signed)
Follow up  [place] from the dentist office states disregard clearance. They have made the decision to extract tooth now.

## 2019-03-29 ENCOUNTER — Other Ambulatory Visit: Payer: Self-pay | Admitting: Cardiology

## 2019-03-29 DIAGNOSIS — I10 Essential (primary) hypertension: Secondary | ICD-10-CM

## 2019-04-01 DIAGNOSIS — Z7189 Other specified counseling: Secondary | ICD-10-CM | POA: Insufficient documentation

## 2019-04-01 DIAGNOSIS — I5032 Chronic diastolic (congestive) heart failure: Secondary | ICD-10-CM | POA: Insufficient documentation

## 2019-04-01 NOTE — Progress Notes (Signed)
Virtual Visit via Telephone Note   This visit type was conducted due to national recommendations for restrictions regarding the COVID-19 Pandemic (e.g. social distancing) in an effort to limit this patient's exposure and mitigate transmission in our community.  Due to his co-morbid illnesses, this patient is at least at moderate risk for complications without adequate follow up.  This format is felt to be most appropriate for this patient at this time.  The patient did not have access to video technology/had technical difficulties with video requiring transitioning to audio format only (telephone).  All issues noted in this document were discussed and addressed.  No physical exam could be performed with this format.  Please refer to the patient's chart for his  consent to telehealth for [facility].   Date:  04/02/2019   ID:  William [name], DOB August 06, 1928, MRN [id number]  Patient Location: Home Provider Location: Home  PCP:  Pleas Koch, NP  Cardiologist:  Minus Breeding, MD  Electrophysiologist:  None   Evaluation Performed:  Follow-Up Visit  Chief Complaint:  CAD  History of Present Illness:    William [name] is a 85 y.o. male who presents for evaluation of atrial fibrillation.  He has had paroxysmal atrial fibrillation. He's had a TIA as well.  He was admitted in August of 2018 with hypertension and elevated troponin.  Cardiac catheterization demonstrated 99% septal perforator and 50% LAD stenosis.  His ejection fraction was well preserved.  He did have an elevated EDP.  He was managed medically.    Since I last saw him he continues to have some problems with his Parkinson's and mostly bladder issues.  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 The patient denies any new symptoms such as chest discomfort, neck or arm discomfort. There has been no new shortness of breath, PND  or orthopnea. There have been no reported palpitations, presyncope or syncope.  The patient does not have symptoms concerning for COVID-19 infection (fever, chills, cough, or new shortness of breath).    Past Medical History:  Diagnosis Date  . A-fib ([place])   . Arthritis    "right knee" (08/09/2014)  . Basal cell carcinoma   . Benign prostatic hypertrophy with urinary obstruction   . Chronic anticoagulation 12/04/2016  . Complication of anesthesia    "difficulty intubation, had to use fiberoptic 2006"  . Difficult intubation   . DJD (degenerative joint disease)   . Gout   . Hx of colonic polyps   . Hyperlipidemia   . Hypertension    sees [name]  . Hypothyroidism   . Kidney tumor    "tumor on cyst on kidney"   . 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 Mini stroke ([place])   . Parkinson disease ([place])   . Pneumonia    hx of in 1952  . Primary skin squamous cell carcinoma   . Subdural hematoma ([place]) 2006  . TIA (transient ischemic attack)    pt does not recall this hx "but may have had one today" (08/09/2014)   Past Surgical History:  Procedure Laterality Date  . APPENDECTOMY    . BACK SURGERY    . CATARACT EXTRACTION W/ INTRAOCULAR LENS  IMPLANT, BILATERAL Bilateral ~ 1998  . CHOLECYSTECTOMY    . CRYOABLATION  07-10-12   "tumor on cyst  on my kidney; had an ablation"  . EYE SURGERY    . INGUINAL HERNIA REPAIR Right   . IR GENERIC HISTORICAL  11/09/2015   IR RADIOLOGIST EVAL & MGMT 11/09/2015 [name], MD [facility] INTERV RAD  . IR RADIOLOGIST EVAL & MGMT  08/13/2016  . KIDNEY SURGERY    . KNEE ARTHROSCOPY Right   . LEFT HEART CATH AND CORONARY ANGIOGRAPHY N/A 12/05/2016   Procedure: LEFT HEART CATH AND CORONARY ANGIOGRAPHY;  Surgeon: [name], [name], MD;  Location: [facility];  Service: Cardiovascular;  Laterality: N/A;  . LUMBAR LAMINECTOMY/DECOMPRESSION MICRODISCECTOMY  05/14/2012   Procedure: LUMBAR LAMINECTOMY/DECOMPRESSION  MICRODISCECTOMY 1 LEVEL;  Surgeon: [name], MD;  Location: [facility];  Service: Neurosurgery;  Laterality: Left;  Left Lumbar four-five Laminectomy/Diskectomy/Far lateral diskectomy  . SKIN CANCER EXCISION  "several"   "back of my neck; right eye; clavicle; nose"  . SUBDURAL HEMATOMA EVACUATION VIA CRANIOTOMY  2006  . THYROIDECTOMY     medullary carcinoma  . TONSILLECTOMY       Current Meds  Medication Sig  . amLODipine (NORVASC) 2.5 MG tablet TAKE 1 TABLET DAILY TO TAKEWITH AMLODIPINE 5MG  FOR    TOTAL OF 7.5MG  DAILY  . amLODipine (NORVASC) 5 MG tablet TAKE 1 TABLET DAILY TO TAKEWITH 2.5MG  TO MAKE 7.5MG    DAILY  . apixaban (ELIQUIS) 2.5 MG TABS tablet Take 1 tablet (2.5 mg total) by mouth 2 (two) times daily.  . carbidopa-levodopa (SINEMET IR) 25-100 MG tablet Take 1.5 tablets by mouth 4 (four) times daily. At 8, 12, 4 PM, and 8 PM  . hydrALAZINE (APRESOLINE) 10 MG tablet Take 10 mg by mouth as directed. Take 1 tablet by mouth up to three times daily for systolic blood pressure readings above 170.  . levothyroxine (SYNTHROID) 100 MCG tablet TAKE 1 TABLET DAILY ON AN  EMPTY STOMACH WITH A FULL  GLASS OF WATER  . losartan (COZAAR) 100 MG tablet Take 1 tablet (100 mg total) by mouth daily at 2 PM.  . nitroGLYCERIN (NITROSTAT) 0.4 MG SL tablet Place 1 tablet (0.4 mg total) under the tongue every 5 (five) minutes x 3 doses as needed for chest pain.  . Probiotic Product (ALIGN) 4 MG CAPS Take 1 capsule by mouth daily.  . rasagiline (AZILECT) 1 MG TABS tablet Take 1 tablet (1 mg total) by mouth daily.  . simvastatin (ZOCOR) 40 MG tablet TAKE 1 TABLET AT BEDTIME     Allergies:   Patient has no known allergies.   Social History   Tobacco Use  . Smoking status: Former Smoker    Packs/day: 0.80    Years: 15.00    Pack years: 12.00    Types: Cigarettes    Quit date: 04/15/1965    Years since quitting: 54.0  . Smokeless tobacco: Never Used  Substance Use Topics  . Alcohol use: Yes     Alcohol/week: 5.0 standard drinks    Types: 5 Glasses of wine per week    Comment: 1 glass of wine every night (6oz glass)  . Drug use: No     Family Hx: The patient's family history includes Alzheimer's disease in his brother and sister; Heart disease in his father; Stroke in his father and mother.  ROS:   Please see the history of present illness.     All other systems reviewed and are negative.   Prior CV studies:   The following studies were reviewed today:  None  Labs/Other Tests and  Data Reviewed:    EKG:  No ECG reviewed.  Recent Labs: 01/19/2019: ALT 6; BUN 20; Creatinine, Ser 1.42; Hemoglobin 14.6; Platelets 167.0; Potassium 3.9; Sodium 142; TSH 2.01   Recent Lipid Panel Lab Results  Component Value Date/Time   CHOL 124 01/19/2019 09:57 AM   TRIG 67.0 01/19/2019 09:57 AM   TRIG 101 03/25/2006 09:35 AM   HDL 49.00 01/19/2019 09:57 AM   CHOLHDL 3 01/19/2019 09:57 AM   LDLCALC 62 01/19/2019 09:57 AM    Wt Readings from Last 3 Encounters:  04/02/19 175 lb (79.4 kg)  03/03/19 182 lb (82.6 kg)  01/19/19 181 lb 4 oz (82.2 kg)     Objective:    Vital Signs:  BP (!) 148/78   Pulse (!) 56   Ht 6' (1.829 m)   Wt 175 lb (79.4 kg)   BMI 23.73 kg/m    VITAL SIGNS:  reviewed  ASSESSMENT & PLAN:    ATRIAL FIB:  The patient has a CHA2DS2 - VASc score of 5.   No change in therapy.   THORACIC AORTIC ENLARGEMENT:    This was 4.4 cm on CT in Dec 2018.    We decided to manage this conservatively without further imaging.   HTN:  The blood pressure is fluctuating but for the most part well controlled.  No change in therapy.   CAD:   The patient has no new sypmtoms.  No further cardiovascular testing is indicated.  We will continue with aggressive risk reduction and meds as listed.  CHRONIC DIASTOLIC HF:  He seems to be euvolemic by history.  No change in therapy.  CKD II: Creatinine was slightly lower at 1.42     COVID-19 Education: The signs and symptoms  of COVID-19 were discussed with the patient and how to seek care for testing (follow up with PCP or arrange E-visit).  He will take the vaccine when able.  The importance of social distancing was discussed today.  Time:   Today, I have spent 16 minutes with the patient with telehealth technology discussing the above problems.     Medication Adjustments/Labs and Tests Ordered: Current medicines are reviewed at length with the patient today.  Concerns regarding medicines are outlined above.   Tests Ordered: No orders of the defined types were placed in this encounter.   Medication Changes: No orders of the defined types were placed in this encounter.   Follow Up:  In Person in six months.   Signed, Minus Breeding, MD  04/02/2019 2:08 PM    [facility]

## 2019-04-02 ENCOUNTER — Telehealth: Payer: Self-pay

## 2019-04-02 ENCOUNTER — Encounter: Payer: Self-pay | Admitting: Cardiology

## 2019-04-02 ENCOUNTER — Telehealth (INDEPENDENT_AMBULATORY_CARE_PROVIDER_SITE_OTHER): Payer: Medicare HMO | Admitting: Cardiology

## 2019-04-02 VITALS — BP 148/78 | HR 56 | Ht 72.0 in | Wt 175.0 lb

## 2019-04-02 DIAGNOSIS — I10 Essential (primary) hypertension: Secondary | ICD-10-CM

## 2019-04-02 DIAGNOSIS — I129 Hypertensive chronic kidney disease with stage 1 through stage 4 chronic kidney disease, or unspecified chronic kidney disease: Secondary | ICD-10-CM | POA: Diagnosis not present

## 2019-04-02 DIAGNOSIS — N182 Chronic kidney disease, stage 2 (mild): Secondary | ICD-10-CM

## 2019-04-02 DIAGNOSIS — Z7189 Other specified counseling: Secondary | ICD-10-CM | POA: Diagnosis not present

## 2019-04-02 DIAGNOSIS — I712 Thoracic aortic aneurysm, without rupture, unspecified: Secondary | ICD-10-CM

## 2019-04-02 DIAGNOSIS — I5032 Chronic diastolic (congestive) heart failure: Secondary | ICD-10-CM

## 2019-04-02 DIAGNOSIS — I2510 Atherosclerotic heart disease of native coronary artery without angina pectoris: Secondary | ICD-10-CM

## 2019-04-02 DIAGNOSIS — I482 Chronic atrial fibrillation, unspecified: Secondary | ICD-10-CM | POA: Diagnosis not present

## 2019-04-02 NOTE — Patient Instructions (Signed)
Medication Instructions:  Your physician recommends that you continue on your current medications as directed. Please refer to the Current Medication list given to you today.  *If you need a refill on your cardiac medications before your next appointment, please call your pharmacy*  Lab Work: none If you have labs (blood work) drawn today and your tests are completely normal, you will receive your results only by: Marland Kitchen MyChart Message (if you have MyChart) OR . A paper copy in the mail If you have any lab test that is abnormal or we need to change your treatment, we will call you to review the results.  Testing/Procedures: none  Follow-Up: At [facility], you and your health needs are our priority.  As part of our continuing mission to provide you with exceptional heart care, we have created designated Provider Care Teams.  These Care Teams include your primary Cardiologist (physician) and Advanced Practice Providers (APPs -  Physician Assistants and Nurse Practitioners) who all work together to provide you with the care you need, when you need it.  Your next appointment:   6 month(s)  The format for your next appointment:   Either In Person or Virtual  Provider:   Minus Breeding, MD

## 2019-04-02 NOTE — Telephone Encounter (Signed)
Letter including After Visit Summary and any other necessary documents mailed to the patient's address on file ob 04/02/2019.

## 2019-04-05 DIAGNOSIS — M545 Low back pain: Secondary | ICD-10-CM | POA: Diagnosis not present

## 2019-04-05 DIAGNOSIS — L309 Dermatitis, unspecified: Secondary | ICD-10-CM | POA: Diagnosis not present

## 2019-05-04 ENCOUNTER — Telehealth (INDEPENDENT_AMBULATORY_CARE_PROVIDER_SITE_OTHER): Payer: Medicare HMO | Admitting: Primary Care

## 2019-05-04 NOTE — Telephone Encounter (Signed)
William Kraft, do you remember which Dx code used?

## 2019-05-04 NOTE — Telephone Encounter (Signed)
[name] called in requesting the diagnosis code for the prescription for pt's lift chair. She's requesting a call back to discuss this.

## 2019-05-04 NOTE — Telephone Encounter (Signed)
I believe I used "[name]" which is G20. I called patient's wife and provided her with the diagnosis code. I also provided her with CPT code for that visit date of 09/10/18.

## 2019-05-05 DIAGNOSIS — N183 Chronic kidney disease, stage 3 unspecified: Secondary | ICD-10-CM | POA: Diagnosis not present

## 2019-05-05 DIAGNOSIS — E039 Hypothyroidism, unspecified: Secondary | ICD-10-CM | POA: Diagnosis not present

## 2019-05-05 DIAGNOSIS — I129 Hypertensive chronic kidney disease with stage 1 through stage 4 chronic kidney disease, or unspecified chronic kidney disease: Secondary | ICD-10-CM | POA: Diagnosis not present

## 2019-05-05 DIAGNOSIS — I2510 Atherosclerotic heart disease of native coronary artery without angina pectoris: Secondary | ICD-10-CM | POA: Diagnosis not present

## 2019-05-05 DIAGNOSIS — Z0189 Encounter for other specified special examinations: Secondary | ICD-10-CM | POA: Diagnosis not present

## 2019-05-05 NOTE — Telephone Encounter (Signed)
William [name], please call. ICD 10 code of [place] and CPT code of 16109

## 2019-05-05 NOTE — Telephone Encounter (Signed)
Patient's wife called stating that [employer] needs more information and she requested that Anda Kraft or Vallarie Mare call them at [phone]. Patient's wife stated that when they call they will need his member ID # [id number] and date of serice 11/06/18.Marland [employer]'s wife stated that the G20 was a code for an office visit and they stated that they need the 5 digit code for parkinson's. . Patient's wife requested that this information be given to [employer] on their behalf.

## 2019-05-05 NOTE — Addendum Note (Signed)
Addended by: Pleas Koch on: 05/05/2019 01:06 PM   Modules accepted: Level of Service

## 2019-05-06 DIAGNOSIS — Z0000 Encounter for general adult medical examination without abnormal findings: Secondary | ICD-10-CM | POA: Diagnosis not present

## 2019-05-07 NOTE — Telephone Encounter (Signed)
Please call William [name] and explain. See if there's something she can do on her end.

## 2019-05-07 NOTE — Telephone Encounter (Signed)
Please advise. I have called [employer] at the number below. I got transfer 3 times and the final rep I spoke stated that they do not see this request. I got the information below but with all the transfer at sure even got the right department. I have no idea what to do next for patient. I can try again later.

## 2019-05-11 NOTE — Telephone Encounter (Signed)
Spoken to patient's wife and she will contact the insurance herself again.

## 2019-06-02 DIAGNOSIS — R69 Illness, unspecified: Secondary | ICD-10-CM | POA: Diagnosis not present

## 2019-06-18 DIAGNOSIS — G3184 Mild cognitive impairment, so stated: Secondary | ICD-10-CM | POA: Diagnosis not present

## 2019-06-18 DIAGNOSIS — D6869 Other thrombophilia: Secondary | ICD-10-CM | POA: Diagnosis not present

## 2019-06-18 DIAGNOSIS — E785 Hyperlipidemia, unspecified: Secondary | ICD-10-CM | POA: Diagnosis not present

## 2019-06-18 DIAGNOSIS — I10 Essential (primary) hypertension: Secondary | ICD-10-CM | POA: Diagnosis not present

## 2019-06-18 DIAGNOSIS — I4891 Unspecified atrial fibrillation: Secondary | ICD-10-CM | POA: Diagnosis not present

## 2019-06-18 DIAGNOSIS — G20 Parkinson's disease: Secondary | ICD-10-CM | POA: Diagnosis not present

## 2019-06-18 DIAGNOSIS — E039 Hypothyroidism, unspecified: Secondary | ICD-10-CM | POA: Diagnosis not present

## 2019-06-18 DIAGNOSIS — I252 Old myocardial infarction: Secondary | ICD-10-CM | POA: Diagnosis not present

## 2019-06-18 DIAGNOSIS — K5900 Constipation, unspecified: Secondary | ICD-10-CM | POA: Diagnosis not present

## 2019-06-18 DIAGNOSIS — I25119 Atherosclerotic heart disease of native coronary artery with unspecified angina pectoris: Secondary | ICD-10-CM | POA: Diagnosis not present

## 2019-07-05 ENCOUNTER — Ambulatory Visit: Payer: Medicare HMO | Admitting: Neurology

## 2019-07-05 ENCOUNTER — Encounter: Payer: Self-pay | Admitting: Neurology

## 2019-07-05 ENCOUNTER — Other Ambulatory Visit: Payer: Self-pay

## 2019-07-05 VITALS — BP 131/69 | HR 52 | Temp 97.3°F | Ht 75.0 in | Wt 181.0 lb

## 2019-07-05 DIAGNOSIS — G20 Parkinson's disease: Secondary | ICD-10-CM | POA: Diagnosis not present

## 2019-07-05 DIAGNOSIS — R498 Other voice and resonance disorders: Secondary | ICD-10-CM

## 2019-07-05 DIAGNOSIS — R269 Unspecified abnormalities of gait and mobility: Secondary | ICD-10-CM

## 2019-07-05 NOTE — Progress Notes (Signed)
Subjective:    Patient ID: William [name] is a 86 y.o. male.  HPI     Interim history:   William [name] is an 86 year old right-handed gentleman with an underlying medical history of right TIA in January 2003, SDH (s/p left craniotomy in August 2006), hypertension, hypothyroidism, thyroid cancer, partial onset seizures in the past (off Dilantin), lumbar spine disease, status post lower back surgery at L4-5 in January 2014, renal tumor, status post kidney tumor ablative surgery in March 2014, who presents for followup consultation of his right-sided predominant [name]. He is accompanied by his partner, William [name] again today. I last saw him on 03/03/19, at which time he reported feeling stable, he had a recent swallow evaluation at home.  He was taking Azilect once daily around lunchtime rather than first thing in the morning because of lower blood pressure values first thing in the morning.  Today, 07/05/2019: He reports overall doing quite well.  William [name] reports that he seems to be more forgetful.  She has to double check that he took all his meds.  He tends to be a little bit more tremulous around dinnertime.  They are able to sit at the [employer] and she has noticed that he becomes more tremulous and more difficult to understand around that time.  She is wondering if his Sinemet could be increased for at least his afternoon dose.  He has not had any falls.  He tries to stay active, they both got the Covid vaccines without any problems thankfully.    The patient's allergies, current medications, family history, past medical history, past social history, past surgical history and problem list were reviewed and updated as appropriate.    Previously (copied from previous notes for reference):    I saw him on 11/04/18, at which time he reported having intermittent spells of feeling weaker.  He had some issues with his mobility.  They were settling in their condo, had moved in April  2020 to [place].  He requested physical, occupational and speech therapy through [facility] rehab and fitness at [facility].    In the interim, a swallow evaluation was recommended through his speech therapist.     I saw him on 05/07/2018, at which time he reported feeling fairly well and stable.  No recent falls reported.  I suggested we continue with Azilect once daily and Sinemet 1-1/2 pills 4 times a day, prescriptions typically are through the [place].     I saw him on 01/05/2018, at which time he reported a recent fall. He scraped his left elbow. He has had some right-sided knee pain and felt like his knee had given out. Otherwise, he was doing rather well. We mutually agreed to keep his medication regimen the same. She was advised to add some more strength training to his exercise regimen and stay well-hydrated.    I saw him on 09/02/2017, at which time he was quite stable motor-wise, he had some stressors including regarding William [name]' health and they were trying to consolidate their households with a plan to move into a retirement community condo by next year. He was advised to continue with generic Azilect once daily and Sinemet generic 1-1/2 pills 4 times a day. He was encouraged to hydrate better with water.   I saw him on 05/05/2017, at which time he reported doing quite well, no recent falls or recent illness. They were planning to move into a retirement community by the end of this year. He  was trying to exercise regularly. Blood pressure was stable. He had seen pulmonology for a lung nodule which was stable. I suggested we continue with his current medication regimen.   I saw him on 12/31/2016, at which time he reported intermittent lightheadedness. He had some blood pressure fluctuations with really high values at times. He was hospitalized in August 2018 for a non-STEMI. He had significant orthostatic hypotension during the office visit. He was advised to talk to his providers about  reducing his blood pressure medication. He was advised to not continue with the boxing classes for [name] but exercise within his limitations at home. He was advised to try melatonin at night for sleep issues.    I saw him on 06/27/2016, at which time he reported doing well, he had a trip to [place] in [place] planned for springtime. He had no recent falls. He was supposed to be on a smaller dose of Eliquis, namely 2.5 mg twice a day. He continued to participate in the boxing class for [name] and felt that they were helpful.   I saw him on 03/05/2016, at which time he reported doing okay. He was during his stay active, was participating in [place] steady boxing, which he felt was helpful. He was avoiding strenuous parts of the exercise. He was interested in participating in a new drug study at [facility]. This was to investigate apomorphine sublingual to decrease off time. He was on Azilect once daily. He was taking Sinemet 4 times a day. He was trying to hydrate well, reported no recent falls. I suggested he increase the Sinemet to 1-1/2 pills 4 times a day. I suggested he continue with generic Azilect once daily.   I saw him on 10/26/2015, at which time he reported doing quite well, had some stumbling episodes, no actual falls, was trying to drink enough water and was still physically active. He had signed up for rock steady boxing classes but William [name] noted that he would get too exhausted.   I saw him on 06/22/2015, at which time he reported doing fairly well. He had finished outpatient OT, PT, and ST. Unfortunately, he did take a fall in the garage couple of months prior as he was coming in from the graduate, stepped backwards on the stairs and slipped off falling backwards, landed on his behind, head struck the car, he denied loss of consciousness or headache or bruise and no sequelae were reported. He was having rails installed in the garage entrance. Was still trying to stay  active, playing golf. We mutually agreed to continue with Sinemet 4 times a day and Azilect once daily. We talked about fall risk and gait safety and fall precautions.   I saw him on 02/23/2015, at which time he reported doing fairly well. He was playing golf regularly. His right knee was bothering him. He had no recent falls. He was trying to hydrate well enough. He had no new issues with A. fib and no new complaints otherwise. I noted right knee swelling. He was advised to follow-up with his primary care physician for this. We mutually agreed to keep his Parkinson's medications the same.    I saw him on 11/01/2014 at which time he reported a recent diagnosis with A. fib. He was admitted to the hospital in April. I reviewed the hospital records from 07/17/2014 through 07/18/2014. He was started on Xarelto. He was then re-admitted on 08/09/14 to 08/10/14 due to altered mental status and slurring of speech and  was suspected to have a TIA. He was seen by neuro in consultation and a baby aspirin was added to Xarelto. He presented back to the ER on 08/18/14. He a [place] wo contrast on  08/18/2014 , which was negative for any acute changes. He was seen by the Neurologist in the emergency room and it was felt that a full TIA workup was not necessary at the time. He had presented with hypertension and some slurring of speech. He was switched from Xarelto to Eliquis, because the Xarelto is not on formulary at the [place].  He endorsed recent stressors, including the recent passing of his younger brother a week prior with advanced Alzheimer's disease.    He had an MRI brain and MRA head on 08/09/14: MRI HEAD: No acute intracranial process, specifically no acute ischemia. Involutional changes. Mild white matter changes suggest chronic small vessel ischemic disease. MRA HEAD: No acute large vessel occlusion or high-grade stenosis. Mid grade stenosis RIGHT P2/3 segment.    I suggested we continue with Sinemet at 4 times a day. He  was advised to continue with Azilect as well. He was advised to drink more water. In the interim, we restarted outpatient physical therapy, occupational therapy and speech therapy.   I saw him on 04/29/2014, at which time he reported doing well overall but he was more fatigued and had some excessive daytime somnolence. Memory was stable. His mood was stable. He noted some blood pressure fluctuations. Sometimes he had a dull headache and sometimes he felt lightheaded. He had some anxiety over his lady friend's health, as she had fallen and broken rib. He continued to walk regularly and played golf. He had no recent falls. I asked him to continue with his medications, Azilect once daily and Sinemet 4 times a day.   I saw him on 10/28/2013, at which time he reported doing well. In particular, had no cognitive complaints, no mood issues, and continued to play golf 3 times a week. He was driving well. I suggested an increase in his levodopa to 1 pill 4 times a day of the 25-100 milligrams strength. [facility] and had seen a [place] neurologist as well.    I saw him on 12/07/2012, at which time I felt that he was doing well on Azilect and levodopa. He called in December 2014 with problems with blood pressure fluctuations. He was wondering if this came from the [facility] but I did not think it was due to the Azilect per se. I was reluctant to take him off of it.    I first met him on 06/03/2012 and he previously followed by Dr. Morene Antu. He has had primarily right-sided symptoms with regards to his Parkinson's, diagnosed in 2010 with symptoms dating back to late 2009 or early 2010. He had briefly tried Mirapex but was taken off d/t hypotension. L spine MRI in June 2013 showed renal cysts, degenerative joint disease most prominent at L4-5. He tried acupuncture. His MRI of the lumbar spine showed abnormalities with his kidney with a cyst and a tumor and he had ablative surgery on  07/10/12 and had a FU CT done. He had lower back surgery on 05/14/12.  I saw him back on 09/03/2012 and I suggested starting Azilect. He has been tolerating it well and both he and his girlfriend felt that he did better with it in terms of dexterity and fine motor control. He had some hypotension and lightheadedness and reduced  his BP medication. He has been having some issue with gout.   His Past Medical History Is Significant For: Past Medical History:  Diagnosis Date  . A-fib ([place])   . Arthritis    "right knee" (08/09/2014)  . Basal cell carcinoma   . Benign prostatic hypertrophy with urinary obstruction   . Chronic anticoagulation 12/04/2016  . Complication of anesthesia    "difficulty intubation, had to use fiberoptic 2006"  . Difficult intubation   . DJD (degenerative joint disease)   . Gout   . Hx of colonic polyps   . Hyperlipidemia   . Hypertension    sees [name]  . Hypothyroidism   . Kidney tumor    "tumor on cyst on kidney"   . 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 Mini stroke ([place])   . Parkinson disease ([place])   . Pneumonia    hx of in 1952  . Primary skin squamous cell carcinoma   . Subdural hematoma ([place]) 2006  . TIA (transient ischemic attack)    pt does not recall this hx "but may have had one today" (08/09/2014)    His Past Surgical History Is Significant For: Past Surgical History:  Procedure Laterality Date  . APPENDECTOMY    . BACK SURGERY    . CATARACT EXTRACTION W/ INTRAOCULAR LENS  IMPLANT, BILATERAL Bilateral ~ 1998  . CHOLECYSTECTOMY    . CRYOABLATION  07-10-12   "tumor on cyst on my kidney; had an ablation"  . EYE SURGERY    . INGUINAL HERNIA REPAIR Right   . IR GENERIC HISTORICAL  11/09/2015   IR RADIOLOGIST EVAL & MGMT 11/09/2015 [name], MD [facility] INTERV RAD  . IR RADIOLOGIST EVAL & MGMT  08/13/2016  . KIDNEY SURGERY    . KNEE ARTHROSCOPY Right   . LEFT HEART CATH AND CORONARY ANGIOGRAPHY N/A 12/05/2016    Procedure: LEFT HEART CATH AND CORONARY ANGIOGRAPHY;  Surgeon: [name], [name], MD;  Location: [place] CV LAB;  Service: Cardiovascular;  Laterality: N/A;  . LUMBAR LAMINECTOMY/DECOMPRESSION MICRODISCECTOMY  05/14/2012   Procedure: LUMBAR LAMINECTOMY/DECOMPRESSION MICRODISCECTOMY 1 LEVEL;  Surgeon: [name], MD;  Location: [place] NEURO ORS;  Service: Neurosurgery;  Laterality: Left;  Left Lumbar four-five Laminectomy/Diskectomy/Far lateral diskectomy  . SKIN CANCER EXCISION  "several"   "back of my neck; right eye; clavicle; nose"  . SUBDURAL HEMATOMA EVACUATION VIA CRANIOTOMY  2006  . THYROIDECTOMY     medullary carcinoma  . TONSILLECTOMY      His Family History Is Significant For: Family History  Problem Relation Age of Onset  . Stroke Mother   . Stroke Father   . Heart disease Father   . Alzheimer's disease Brother   . Alzheimer's disease Sister     His Social History Is Significant For: Social History   Socioeconomic History  . Marital status: Widowed    Spouse name: Not on file  . Number of children: 3  . Years of education: 58  . Highest education level: Not on file  Occupational History  . Occupation: retired  Tobacco Use  . Smoking status: Former Smoker    Packs/day: 0.80    Years: 15.00    Pack years: 12.00    Types: Cigarettes    Quit date: 04/15/1965    Years since quitting: 54.2  . Smokeless tobacco: Never Used  Substance and Sexual Activity  . Alcohol use: Yes    Alcohol/week: 5.0 standard drinks  Types: 5 Glasses of wine per week    Comment: 1 glass of wine every night (6oz glass)  . Drug use: No  . Sexual activity: Yes  Other Topics Concern  . Not on file  Social History Narrative   Married.   Lives at home with significant other.   Retried. 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   Enjoys golfing, yard work, spending time with family.     Social Determinants of Health   Financial Resource Strain:   . Difficulty of Paying Living Expenses:    Food Insecurity:   . Worried About Charity fundraiser in the Last Year:   . Arboriculturist in the Last Year:   Transportation Needs:   . Film/video editor (Medical):   Marland Kitchen Lack of Transportation (Non-Medical):   Physical Activity:   . Days of Exercise per Week:   . Minutes of Exercise per Session:   Stress:   . Feeling of Stress :   Social Connections:   . Frequency of Communication with Friends and Family:   . Frequency of Social Gatherings with Friends and Family:   . Attends Religious Services:   . Active Member of Clubs or Organizations:   . Attends Archivist Meetings:   Marland Kitchen Marital Status:     His Allergies Are:  No Known Allergies:   His Current Medications Are:  Outpatient Encounter Medications as of 07/05/2019  Medication Sig  . amLODipine (NORVASC) 2.5 MG tablet TAKE 1 TABLET DAILY TO TAKEWITH AMLODIPINE 5MG FOR    TOTAL OF 7.5MG DAILY  . amLODipine (NORVASC) 5 MG tablet TAKE 1 TABLET DAILY TO TAKEWITH 2.5MG TO MAKE 7.5MG   DAILY  . apixaban (ELIQUIS) 2.5 MG TABS tablet Take 1 tablet (2.5 mg total) by mouth 2 (two) times daily.  . carbidopa-levodopa (SINEMET IR) 25-100 MG tablet Take 1.5 tablets by mouth 4 (four) times daily. At 8, 12, 4 PM, and 8 PM  . hydrALAZINE (APRESOLINE) 10 MG tablet Take 10 mg by mouth as directed. Take 1 tablet by mouth up to three times daily for systolic blood pressure readings above 170.  . levothyroxine (SYNTHROID) 100 MCG tablet TAKE 1 TABLET DAILY ON AN  EMPTY STOMACH WITH A FULL  GLASS OF WATER  . losartan (COZAAR) 100 MG tablet Take 1 tablet (100 mg total) by mouth daily at 2 PM.  . nitroGLYCERIN (NITROSTAT) 0.4 MG SL tablet Place 1 tablet (0.4 mg total) under the tongue every 5 (five) minutes x 3 doses as needed for chest pain.  . Probiotic Product (ALIGN) 4 MG CAPS Take 1 capsule by mouth daily.  . rasagiline (AZILECT) 1 MG TABS tablet Take 1 tablet (1 mg total) by mouth daily.  . simvastatin (ZOCOR) 40 MG tablet TAKE 1  TABLET AT BEDTIME   No facility-administered encounter medications on file as of 07/05/2019.  :  Review of Systems:  Out of a complete 14 point review of systems, all are reviewed and negative with the exception of these symptoms as listed below: Review of Systems  Neurological:       Pt presents today for PD follow up.    Objective:  Neurological Exam  Physical Exam Physical Examination:   Vitals:   07/05/19 1118  BP: 131/69  Pulse: (!) 52  Temp: (!) 97.3 F (36.3 C)    General Examination: The patient is a very pleasant 86 y.o. male in no acute distress. He appears well-developed and well-nourished  and well groomed.   HEENT:Normocephalic, atraumatic, pupils are equal, round and reactive to light, extraocular trackingshows mild saccadic breakdown. Speech is moderately hypophonic and mildly to moderatelydysarthric, but stable. Hearing is impaired. Oropharynx exam revealsmoderatemouth dryness, tongue protrudes centrally and palate elevates symmetrically. No other abnormal findings, he has moderateto severenuchal rigidity and decrease in passive range of motion.  Chest:Clear to auscultation without wheezing, rhonchi or crackles noted.  Heart:S1+S2+0, regular and normal without murmurs, rubs or gallops noted.   Abdomen:Soft, non-tender and non-distended with normal bowel sounds appreciated on auscultation.  Extremities:There is nopitting edema in the distal lower extremities bilaterally.   Skin: Warm and dry without trophic changes noted. Mild bruising on hands, stable, chronic.   Musculoskeletal: exam reveals no obvious joint deformities, tenderness or joint swelling orredness.   Neurologically:  Mental status: The patient is awake, alert and oriented in all 4 spheres. Hisimmediate and remote memory, attention, language skills and fund of knowledge are appropriate. There is no evidence of aphasia, agnosia, apraxia or anomia. Thought process is linear. Mood  is normaland affect is normal.  Cranial nerves II - XII are as described above under HEENT exam.  Motor exam:thin muscle bulk globally,average strength for ageis about 4/5.He has increased tone in the right more than left upper and lower extremities. He has a resting tremor in both upper extremities, R>L.Fine motor skills are moderately impaired on the right andslightly better on theleft. He has mild intermittent dyskinesias, mostly on the right side and some cocontraction.Reflexes are 1+ throughout. He stands up with mild difficulty andpushes himself up. Posture ismildly stooped for age.He walks with decreased stride length and decrease pace,decreased arm swing bilaterally, right more than left.More insecurity with turns today, Did not bring a cane.Sensory exam is intact to light touch. Cerebellar testing shows no additional concern for dysmetria or intention tremor or gait ataxia.  Assessment and Plan:   In summary, William Melander Broughtonis a very pleasant 86 year old malewithan underlying medical history of right TIA in January 2003, SDH (s/p left craniotomy in August 2006), hypertension, hypothyroidism, thyroid cancer, partial onset seizures (off Dilantin), lumbar spine disease, status-post lower back surgery at L4-5 in January 2014, renal tumor, status post kidney tumor ablative surgery in March 2014, who presents for followup consultation of hisadvancedright-sided predominant [name],complicated bysome autonomic dysregulation, history of falls, and overall muscular deconditioning and balance issues. His blood pressurehasbeenon the lower end,but stable, heart rate in the lower 50s,typically stable. He is currently on generic Azilect 1 mg once daily and generic Sinemet 1-1/2 pills 4 times a day. We mutually agreed to increase the 4 PM dose to 2 pills and keep the other keep the other 3 doses at 1 1/2 pills each. He gets his prescriptions from the [place]. I advised them to  call for an update next week. If he has benefited from taking the 2 pills at 4PM, I will adjust his prescription. We talked about the importance of good hydration, nutrition, proper rest, and fall prevention.  He is advised to start using his cane when he goes out. He will follow-up routinely in 4 months, sooner if needed.  I answered all their questions today and the patient and William [name] were in agreement. I spent 30 minutes in total face-to-face time and in reviewing records during pre-charting, more than 50% of which was spent in counseling and coordination of care, reviewing test results, reviewing medications and treatment regimen and/or in discussing or reviewing the diagnosis of PD, the prognosis and treatment  options. Pertinent laboratory and imaging test results that were available during this visit with the patient were reviewed by me and considered in my medical decision making (see chart for details).

## 2019-07-05 NOTE — Patient Instructions (Addendum)
As discussed, we will increase your Sinemet slightly, you will continue to take 1-1/2 pills at 830, 1230 and 8:30 PM, but for your 4:30 PM dose you will take 2 pills.  Try this for the next 3 to 5 days and give [place] an update next week.  If you feel like your tremor is better in the early evening hours around your dinnertime, we can consistently have you take 2 pills at 4:30 PM.  Please let me know as I have not yet made an adjustment in your prescription and we would have to fax your prescription to the [place], as you know.   I would like for you to start using your cane for safety when you go out.

## 2019-07-13 ENCOUNTER — Telehealth: Payer: Self-pay | Admitting: Neurology

## 2019-07-13 DIAGNOSIS — G20 Parkinson's disease: Secondary | ICD-10-CM

## 2019-07-13 MED ORDER — CARBIDOPA-LEVODOPA 25-100 MG PO TABS: ORAL_TABLET | ORAL | 3 refills | Status: DC

## 2019-07-13 NOTE — Telephone Encounter (Signed)
I spoke with Dr. Rexene Alberts, RX for sinemet updated. I called pt's GF, [name], per [facility] and advised her of the updated RX for sinemet. Will fax to the [place]. Pt's GF verbalized understanding. Pt had no questions at this time but was encouraged to call back if questions arise.

## 2019-07-13 NOTE — Telephone Encounter (Signed)
Pt wife called to advise that the two carbidopa-levodopa (SINEMET IR) 25-100 MG tablet    in the evening is working well and would like to know if the prescription could be adjusted for that dosing.

## 2019-08-10 DIAGNOSIS — R69 Illness, unspecified: Secondary | ICD-10-CM | POA: Diagnosis not present

## 2019-08-25 DIAGNOSIS — R69 Illness, unspecified: Secondary | ICD-10-CM | POA: Diagnosis not present

## 2019-09-24 DIAGNOSIS — H6122 Impacted cerumen, left ear: Secondary | ICD-10-CM | POA: Diagnosis not present

## 2019-10-28 DIAGNOSIS — M4312 Spondylolisthesis, cervical region: Secondary | ICD-10-CM | POA: Diagnosis not present

## 2019-10-28 DIAGNOSIS — S80212A Abrasion, left knee, initial encounter: Secondary | ICD-10-CM | POA: Diagnosis not present

## 2019-10-28 DIAGNOSIS — S80211A Abrasion, right knee, initial encounter: Secondary | ICD-10-CM | POA: Diagnosis not present

## 2019-10-28 DIAGNOSIS — S50311A Abrasion of right elbow, initial encounter: Secondary | ICD-10-CM | POA: Diagnosis not present

## 2019-10-28 DIAGNOSIS — S199XXA Unspecified injury of neck, initial encounter: Secondary | ICD-10-CM | POA: Diagnosis not present

## 2019-10-28 DIAGNOSIS — Y998 Other external cause status: Secondary | ICD-10-CM | POA: Diagnosis not present

## 2019-10-28 DIAGNOSIS — W1789XA Other fall from one level to another, initial encounter: Secondary | ICD-10-CM | POA: Diagnosis not present

## 2019-10-28 DIAGNOSIS — G9389 Other specified disorders of brain: Secondary | ICD-10-CM | POA: Diagnosis not present

## 2019-10-28 DIAGNOSIS — S0081XA Abrasion of other part of head, initial encounter: Secondary | ICD-10-CM | POA: Diagnosis not present

## 2019-10-28 DIAGNOSIS — I10 Essential (primary) hypertension: Secondary | ICD-10-CM | POA: Diagnosis not present

## 2019-10-28 DIAGNOSIS — S0990XA Unspecified injury of head, initial encounter: Secondary | ICD-10-CM | POA: Diagnosis not present

## 2019-10-28 DIAGNOSIS — Y9301 Activity, walking, marching and hiking: Secondary | ICD-10-CM | POA: Diagnosis not present

## 2019-10-28 DIAGNOSIS — R52 Pain, unspecified: Secondary | ICD-10-CM | POA: Diagnosis not present

## 2019-10-28 DIAGNOSIS — W19XXXA Unspecified fall, initial encounter: Secondary | ICD-10-CM | POA: Diagnosis not present

## 2019-10-28 DIAGNOSIS — I7090 Unspecified atherosclerosis: Secondary | ICD-10-CM | POA: Diagnosis not present

## 2019-10-28 DIAGNOSIS — I6782 Cerebral ischemia: Secondary | ICD-10-CM | POA: Diagnosis not present

## 2019-11-03 DIAGNOSIS — Z0189 Encounter for other specified special examinations: Secondary | ICD-10-CM | POA: Diagnosis not present

## 2019-11-03 DIAGNOSIS — R7309 Other abnormal glucose: Secondary | ICD-10-CM | POA: Diagnosis not present

## 2019-11-04 ENCOUNTER — Ambulatory Visit: Payer: Medicare HMO | Admitting: Neurology

## 2019-11-04 ENCOUNTER — Other Ambulatory Visit: Payer: Self-pay

## 2019-11-04 ENCOUNTER — Encounter: Payer: Self-pay | Admitting: Neurology

## 2019-11-04 VITALS — BP 136/86 | HR 60 | Ht 75.0 in | Wt 181.4 lb

## 2019-11-04 DIAGNOSIS — R5381 Other malaise: Secondary | ICD-10-CM

## 2019-11-04 DIAGNOSIS — G479 Sleep disorder, unspecified: Secondary | ICD-10-CM

## 2019-11-04 DIAGNOSIS — R269 Unspecified abnormalities of gait and mobility: Secondary | ICD-10-CM | POA: Diagnosis not present

## 2019-11-04 DIAGNOSIS — Z9181 History of falling: Secondary | ICD-10-CM

## 2019-11-04 DIAGNOSIS — R7309 Other abnormal glucose: Secondary | ICD-10-CM | POA: Diagnosis not present

## 2019-11-04 DIAGNOSIS — G20 Parkinson's disease: Secondary | ICD-10-CM

## 2019-11-04 DIAGNOSIS — I10 Essential (primary) hypertension: Secondary | ICD-10-CM | POA: Diagnosis not present

## 2019-11-04 MED ORDER — CARBIDOPA-LEVODOPA ER 50-200 MG PO TBCR: 1.0000 | EXTENDED_RELEASE_TABLET | Freq: Every day | ORAL | 3 refills | Status: DC

## 2019-11-04 NOTE — Patient Instructions (Signed)
I am so sorry to hear that you had a fall last week.  I am glad they checked you out any emergency room and that you did not sustain any major injuries.  Please use your cane as needed but also consider using a walker for outside use for better stability.  If need be, I will prescribe a walker for you.  As discussed, we will add Sinemet CR at bedtime, which is a long acting formulation of levodopa.  This is to help you have less tremor at night and better mobility over the night, especially if you have to get up and use the bathroom.

## 2019-11-04 NOTE — Progress Notes (Signed)
Subjective:    Patient ID: William [name] is a 86 y.o. male.  HPI     Interim history:   [name] is an 86 year old right-handed gentleman with an underlying medical history of right TIA in January 2003, SDH (s/p left craniotomy in August 2006), hypertension, hypothyroidism, thyroid cancer, partial onset seizures in the past (off Dilantin), lumbar spine disease, status post lower back surgery at L4-5 in January 2014, renal tumor, status post kidney tumor ablative surgery in March 2014, who presents for followup consultation of his right-sided predominant [name]. He is accompanied by his partner, [name] again today. I last saw him on 07/05/2019 , At which time he reported doing fairly well.  She had noticed more forgetfulness.  We continued his medications at the current dose and timings, with the exception of increasing the 4 PM dose to 2 pills instead of 1-1/2 pills, in the hopes that he would have slightly better symptom control when they would go to the dining hall in the evening for their dinner.  Today, 11/04/2019: He reports doing better, unfortunately, he took a fall on 10/28/2019 and injured his knee and hit his head.  He was in the parking lot at their independent living community and was waiting for the car service person as his battery had died in the car.  He was not in a hurry to go somewhere but was checking the car to have it ready when [name] needed to go to an appointment in the afternoon.  Unfortunately, he was somewhat in a rash when he was trying to walk outside the garage to meet the car service provider and he tripped over the marker for the different parking spots and fell onto the ground.  He did not lose consciousness, he was taken to [facility] ER and had scans.  He had a head CT and cervical spine CT without contrast on 10/28/2019, no acute injuries were seen.  He did not require stitches.  He was up-to-date with his tetanus shots per [name].  She was not there at  the exact time when this all happened as she had gone to another appointment.  He has been using a three-point cane, they also have a walker from her prior need and he has adjusted the handles to his height but does not use any walking aid inside the home.  He continues to have urinary frequency.  He is supposed to see a urologist soon.  He does not sleep well at night, he is more Tremulous at night and has trouble getting out of bed, sometimes she has to help him if he has to go to the bathroom.  He tends to be more sleepy during the day per [facility].    The patient's allergies, current medications, family history, past medical history, past social history, past surgical history and problem list were reviewed and updated as appropriate.    Previously (copied from previous notes for reference):    I saw him on 03/03/19, at which time he reported feeling stable, he had a recent swallow evaluation at home.  He was taking Azilect once daily around lunchtime rather than first thing in the morning because of lower blood pressure values first thing in the morning.     I saw him on 11/04/18, at which time he reported having intermittent spells of feeling weaker.  He had some issues with his mobility.  They were settling in their condo, had moved in April 2020 to [place].  He requested physical, occupational and speech therapy through [facility] rehab and fitness at [facility].    In the interim, a swallow evaluation was recommended through his speech therapist.     I saw him on 05/07/2018, at which time he reported feeling fairly well and stable.  No recent falls reported.  I suggested we continue with Azilect once daily and Sinemet 1-1/2 pills 4 times a day, prescriptions typically are through the [place].     I saw him on 01/05/2018, at which time he reported a recent fall. He scraped his left elbow. He has had some right-sided knee pain and felt like his knee had given out. Otherwise, he was doing rather  well. We mutually agreed to keep his medication regimen the same. She was advised to add some more strength training to his exercise regimen and stay well-hydrated.    I saw him on 09/02/2017, at which time he was quite stable motor-wise, he had some stressors including regarding [name]' health and they were trying to consolidate their households with a plan to move into a retirement community condo by next year. He was advised to continue with generic Azilect once daily and Sinemet generic 1-1/2 pills 4 times a day. He was encouraged to hydrate better with water.   I saw him on 05/05/2017, at which time he reported doing quite well, no recent falls or recent illness. They were planning to move into a retirement community by the end of this year. He was trying to exercise regularly. Blood pressure was stable. He had seen pulmonology for a lung nodule which was stable. I suggested we continue with his current medication regimen.   I saw him on 12/31/2016, at which time he reported intermittent lightheadedness. He had some blood pressure fluctuations with really high values at times. He was hospitalized in August 2018 for a non-STEMI. He had significant orthostatic hypotension during the office visit. He was advised to talk to his providers about reducing his blood pressure medication. He was advised to not continue with the boxing classes for [name] but exercise within his limitations at home. He was advised to try melatonin at night for sleep issues.    I saw him on 06/27/2016, at which time he reported doing well, he had a trip to [place] in [place] planned for springtime. He had no recent falls. He was supposed to be on a smaller dose of Eliquis, namely 2.5 mg twice a day. He continued to participate in the boxing class for [name] and felt that they were helpful.   I saw him on 03/05/2016, at which time he reported doing okay. He was during his stay active, was participating in  [place] steady boxing, which he felt was helpful. He was avoiding strenuous parts of the exercise. He was interested in participating in a new drug study at [facility]. This was to investigate apomorphine sublingual to decrease off time. He was on Azilect once daily. He was taking Sinemet 4 times a day. He was trying to hydrate well, reported no recent falls. I suggested he increase the Sinemet to 1-1/2 pills 4 times a day. I suggested he continue with generic Azilect once daily.   I saw him on 10/26/2015, at which time he reported doing quite well, had some stumbling episodes, no actual falls, was trying to drink enough water and was still physically active. He had signed up for rock steady boxing classes but [name] noted that he would get too exhausted.  I saw him on 06/22/2015, at which time he reported doing fairly well. He had finished outpatient OT, PT, and ST. Unfortunately, he did take a fall in the garage couple of months prior as he was coming in from the graduate, stepped backwards on the stairs and slipped off falling backwards, landed on his behind, head struck the car, he denied loss of consciousness or headache or bruise and no sequelae were reported. He was having rails installed in the garage entrance. Was still trying to stay active, playing golf. We mutually agreed to continue with Sinemet 4 times a day and Azilect once daily. We talked about fall risk and gait safety and fall precautions.   I saw him on 02/23/2015, at which time he reported doing fairly well. He was playing golf regularly. His right knee was bothering him. He had no recent falls. He was trying to hydrate well enough. He had no new issues with A. fib and no new complaints otherwise. I noted right knee swelling. He was advised to follow-up with his primary care physician for this. We mutually agreed to keep his Parkinson's medications the same.    I saw him on 11/01/2014 at which time he reported a recent diagnosis with A.  fib. He was admitted to the hospital in April. I reviewed the hospital records from 07/17/2014 through 07/18/2014. He was started on Xarelto. He was then re-admitted on 08/09/14 to 08/10/14 due to altered mental status and slurring of speech and was suspected to have a TIA. He was seen by neuro in consultation and a baby aspirin was added to Xarelto. He presented back to the ER on 08/18/14. He a Center [facility] contrast on  08/18/2014 , which was negative for any acute changes. He was seen by the Neurologist in the emergency room and it was felt that a full TIA workup was not necessary at the time. He had presented with hypertension and some slurring of speech. He was switched from Xarelto to Eliquis, because the Xarelto is not on formulary at the [place].  He endorsed recent stressors, including the recent passing of his younger brother a week prior with advanced Alzheimer's disease.    He had an MRI brain and MRA head on 08/09/14: MRI HEAD: No acute intracranial process, specifically no acute ischemia. Involutional changes. Mild white matter changes suggest chronic small vessel ischemic disease. MRA HEAD: No acute large vessel occlusion or high-grade stenosis. Mid grade stenosis RIGHT P2/3 segment.    I suggested we continue with Sinemet at 4 times a day. He was advised to continue with Azilect as well. He was advised to drink more water. In the interim, we restarted outpatient physical therapy, occupational therapy and speech therapy.   I saw him on 04/29/2014, at which time he reported doing well overall but he was more fatigued and had some excessive daytime somnolence. Memory was stable. His mood was stable. He noted some blood pressure fluctuations. Sometimes he had a dull headache and sometimes he felt lightheaded. He had some anxiety over his lady friend's health, as she had fallen and broken rib. He continued to walk regularly and played golf. He had no recent falls. I asked him to continue with his medications,  Azilect once daily and Sinemet 4 times a day.   I saw him on 10/28/2013, at which time he reported doing well. In particular, had no cognitive complaints, no mood issues, and continued to play golf 3 times a week. He was driving well. I  suggested an increase in his levodopa to 1 pill 4 times a day of the 25-100 milligrams strength. [facility] and had seen a [place] neurologist as well.    I saw him on 12/07/2012, at which time I felt that he was doing well on Azilect and levodopa. He called in December 2014 with problems with blood pressure fluctuations. He was wondering if this came from the [facility] but I did not think it was due to the Azilect per se. I was reluctant to take him off of it.    I first met him on 06/03/2012 and he previously followed by Dr. Morene Antu. He has had primarily right-sided symptoms with regards to his Parkinson's, diagnosed in 2010 with symptoms dating back to late 2009 or early 2010. He had briefly tried Mirapex but was taken off d/t hypotension. L spine MRI in June 2013 showed renal cysts, degenerative joint disease most prominent at L4-5. He tried acupuncture. His MRI of the lumbar spine showed abnormalities with his kidney with a cyst and a tumor and he had ablative surgery on 07/10/12 and had a FU CT done. He had lower back surgery on 05/14/12.  I saw him back on 09/03/2012 and I suggested starting Azilect. He has been tolerating it well and both he and his girlfriend felt that he did better with it in terms of dexterity and fine motor control. He had some hypotension and lightheadedness and reduced his BP medication. He has been having some issue with gout.     His Past Medical History Is Significant For: Past Medical History:  Diagnosis Date  . A-fib ([place])   . Arthritis    "right knee" (08/09/2014)  . Basal cell carcinoma   . Benign prostatic hypertrophy with urinary obstruction   . Chronic anticoagulation 12/04/2016  . Complication  of anesthesia    "difficulty intubation, had to use fiberoptic 2006"  . Difficult intubation   . DJD (degenerative joint disease)   . Gout   . Hx of colonic polyps   . Hyperlipidemia   . Hypertension    sees [name]  . Hypothyroidism   . Kidney tumor    "tumor on cyst on kidney"   . 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 Mini stroke ([place])   . Parkinson disease ([place])   . Pneumonia    hx of in 1952  . Primary skin squamous cell carcinoma   . Subdural hematoma ([place]) 2006  . TIA (transient ischemic attack)    pt does not recall this hx "but may have had one today" (08/09/2014)    His Past Surgical History Is Significant For: Past Surgical History:  Procedure Laterality Date  . APPENDECTOMY    . BACK SURGERY    . CATARACT EXTRACTION W/ INTRAOCULAR LENS  IMPLANT, BILATERAL Bilateral ~ 1998  . CHOLECYSTECTOMY    . CRYOABLATION  07-10-12   "tumor on cyst on my kidney; had an ablation"  . EYE SURGERY    . INGUINAL HERNIA REPAIR Right   . IR GENERIC HISTORICAL  11/09/2015   IR RADIOLOGIST EVAL & MGMT 11/09/2015 [name], MD [facility] INTERV RAD  . IR RADIOLOGIST EVAL & MGMT  08/13/2016  . KIDNEY SURGERY    . KNEE ARTHROSCOPY Right   . LEFT HEART CATH AND CORONARY ANGIOGRAPHY N/A 12/05/2016   Procedure: LEFT HEART CATH AND CORONARY ANGIOGRAPHY;  Surgeon: [name], [name], MD;  Location: [facility];  Service: Cardiovascular;  Laterality: N/A;  . LUMBAR LAMINECTOMY/DECOMPRESSION MICRODISCECTOMY  05/14/2012   Procedure: LUMBAR LAMINECTOMY/DECOMPRESSION MICRODISCECTOMY 1 LEVEL;  Surgeon: [name], MD;  Location: [facility];  Service: Neurosurgery;  Laterality: Left;  Left Lumbar four-five Laminectomy/Diskectomy/Far lateral diskectomy  . SKIN CANCER EXCISION  "several"   "back of my neck; right eye; clavicle; nose"  . SUBDURAL HEMATOMA EVACUATION VIA CRANIOTOMY  2006  . THYROIDECTOMY     medullary carcinoma  . TONSILLECTOMY      His  Family History Is Significant For: Family History  Problem Relation Age of Onset  . Stroke Mother   . Stroke Father   . Heart disease Father   . Alzheimer's disease Brother   . Alzheimer's disease Sister     His Social History Is Significant For: Social History   Socioeconomic History  . Marital status: Widowed    Spouse name: Not on file  . Number of children: 3  . Years of education: 33  . Highest education level: Not on file  Occupational History  . Occupation: retired  Tobacco Use  . Smoking status: Former Smoker    Packs/day: 0.80    Years: 15.00    Pack years: 12.00    Types: Cigarettes    Quit date: 04/15/1965    Years since quitting: 54.5  . Smokeless tobacco: Never Used  Vaping Use  . Vaping Use: Never used  Substance and Sexual Activity  . Alcohol use: Yes    Alcohol/week: 5.0 standard drinks    Types: 5 Glasses of wine per week    Comment: 1 glass of wine every night (6oz glass)  . Drug use: No  . Sexual activity: Yes  Other Topics Concern  . Not on file  Social History Narrative   Married.   Lives at home with significant other.   Retried. 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   Enjoys golfing, yard work, spending time with family.     Social Determinants of Health   Financial Resource Strain:   . Difficulty of Paying Living Expenses:   Food Insecurity:   . Worried About Charity fundraiser in the Last Year:   . Arboriculturist in the Last Year:   Transportation Needs:   . Film/video editor (Medical):   Marland Kitchen Lack of Transportation (Non-Medical):   Physical Activity:   . Days of Exercise per Week:   . Minutes of Exercise per Session:   Stress:   . Feeling of Stress :   Social Connections:   . Frequency of Communication with Friends and Family:   . Frequency of Social Gatherings with Friends and Family:   . Attends Religious Services:   . Active Member of Clubs or Organizations:   . Attends Archivist Meetings:   Marland Kitchen Marital Status:      His Allergies Are:  No Known Allergies:   His Current Medications Are:  Outpatient Encounter Medications as of 11/04/2019  Medication Sig  . amLODipine (NORVASC) 2.5 MG tablet TAKE 1 TABLET DAILY TO TAKEWITH AMLODIPINE 5MG FOR    TOTAL OF 7.5MG DAILY  . amLODipine (NORVASC) 5 MG tablet TAKE 1 TABLET DAILY TO TAKEWITH 2.5MG TO MAKE 7.5MG   DAILY  . apixaban (ELIQUIS) 2.5 MG TABS tablet Take 1 tablet (2.5 mg total) by mouth 2 (two) times daily.  . carbidopa-levodopa (SINEMET IR) 25-100 MG tablet Take 1.5 tablets three times a day at 8am,  12, and 8 PM. Take 2 tablets at 4pm.  . hydrALAZINE (APRESOLINE) 10 MG tablet Take 10 mg by mouth as directed. Take 1 tablet by mouth up to three times daily for systolic blood pressure readings above 170.  . levothyroxine (SYNTHROID) 100 MCG tablet TAKE 1 TABLET DAILY ON AN  EMPTY STOMACH WITH A FULL  GLASS OF WATER  . losartan (COZAAR) 100 MG tablet Take 1 tablet (100 mg total) by mouth daily at 2 PM.  . nitroGLYCERIN (NITROSTAT) 0.4 MG SL tablet Place 1 tablet (0.4 mg total) under the tongue every 5 (five) minutes x 3 doses as needed for chest pain.  . Probiotic Product (ALIGN) 4 MG CAPS Take 1 capsule by mouth daily.  . rasagiline (AZILECT) 1 MG TABS tablet Take 1 tablet (1 mg total) by mouth daily.  . simvastatin (ZOCOR) 40 MG tablet TAKE 1 TABLET AT BEDTIME   No facility-administered encounter medications on file as of 11/04/2019.  :  Review of Systems:  Out of a complete 14 point review of systems, all are reviewed and negative with the exception of these symptoms as listed below:  Review of Systems  Neurological:       Here for f/u on PD- reports 1 fall since last visit. Reports he was in the parking lot of his assisted living and fell over a parking barrier. Reports he hit his head, and scuffed his elbows and knees up.  Pt was evaluated in the ED no acute findings. Reports feeling sluggish today but improving each day.     Objective:   Neurological Exam  Physical Exam Physical Examination:   Vitals:   11/04/19 1131  BP: (!) 136/86  Pulse: 60  SpO2: 99%    General Examination: The patient is a very pleasant 86 y.o. male in no acute distress. He appears well-developed and well-nourished and well groomed.   HEENT:Normocephalic, atraumatic, pupils are equal, round and reactive to light, extraocular trackingshows mild saccadic breakdown. Speech is moderately hypophonic and moderatelydysarthric, but stable. Hearing is impaired. Oropharynx exam revealsmoderatemouth dryness, tongue protrudes centrally and palate elevates symmetrically. No other abnormal findings, he has moderateto severenuchal rigidity and decrease in passive range of motion. Bruising R forehead.  Chest:Clear to auscultation without wheezing, rhonchi or crackles noted.  Heart:S1+S2+0, regular and normal without murmurs, rubs or gallops noted.   Abdomen:Soft, non-tender and non-distended with normal bowel sounds appreciated on auscultation.  Extremities:There is bandages on the right knee, and also left knee.  Chronic mild bruising noted throughout upper and lower extremities.    Skin: Warm and dry without trophic changes noted. Mild bruising on hands, stable, chronic.   Musculoskeletal: exam reveals no obvious joint deformities, tenderness or joint swelling orredness.   Neurologically:  Mental status: The patient is awake, alert and oriented in all 4 spheres. Hisimmediate and remote memory, attention, language skills and fund of knowledge are appropriate. There is no evidence of aphasia, agnosia, apraxia or anomia. Thought process is linear. Mood is normaland affect is normal.  Cranial nerves II - XII are as described above under HEENT exam.  Motor exam:thin muscle bulk globally,average strength for ageis about 4/5.He has increased tone in the right more than left upper and lower extremities. He has a resting tremor in both upper  extremities, R>L, mild to moderate.Fine motor skills are moderately impaired on the right andslightly better on theleft. He has mild intermittent dyskinesias, mostly on the right side and some cocontraction.He stands up with mild difficulty andpushes himself  up. Posture ismildly stooped for age.He walks with decreased stride length and decrease pace,decreased arm swing bilaterally, using a cane today.  More insecurity noted with turns and needed assistance as he was noted to have slight stumbling with his turns.  Sensory exam is intact to light touch. Cerebellar testing shows no additional concern for dysmetria or intention tremor or gait ataxia.  Assessment and Plan:   In summary, Yojan Paskett Broughtonis a very pleasant 86 year old malewithan underlying medical history of right TIA in January 2003, SDH (s/p left craniotomy in August 2006), hypertension, hypothyroidism, thyroid cancer, partial onset seizures (off Dilantin), lumbar spine disease, status-post lower back surgery at L4-5 in January 2014, renal tumor, status post kidney tumor ablative surgery in March 2014, who presents for followup consultation of hisadvancedright-sided predominant [name],complicated bysome autonomic dysregulation, history of falls, and overall muscular deconditioning and balance issues, sleep disturbance, mild memory issues and a recent fall.  His blood pressure tends to be on the lower end of normal in the mornings and then rises in the afternoons.  He is followed by cardiology.  He continues to take Azilect 1 mg once daily and Sinemet generic 1-1/2 pills 3 times a day and 2 pills at 4 PM.  Today I suggested he add a Sinemet CR at bedtime in the hope that he will have slightly better tremor control overnight and better mobility if he has to get up to use the bathroom at night.  If he ends up sleeping just a little bit better overnight, he may be less sleepy during the day.  We talked about the importance  of fall prevention again today.  He is advised to use his cane but also consider using a walker when he goes outside.  I offered to prescribe a walker but he has 1 at the house that they want to use first. We will send the prescription for Sinemet CR 50-200 mg strength to the [facility] and they mailed the medicine to his house.  He will follow-up routinely in 4 months, sooner if needed. I answered all their questions today and the patient and [name] were in agreement. I spent 40 minutes in total face-to-face time and in reviewing records during pre-charting, more than 50% of which was spent in counseling and coordination of care, reviewing test results, reviewing medications and treatment regimen and/or in discussing or reviewing the diagnosis of PD, the prognosis and treatment options. Pertinent laboratory and imaging test results that were available during this visit with the patient were reviewed by me and considered in my medical decision making (see chart for details).

## 2019-11-09 DIAGNOSIS — S80252A Superficial foreign body, left knee, initial encounter: Secondary | ICD-10-CM | POA: Diagnosis not present

## 2019-11-09 DIAGNOSIS — L089 Local infection of the skin and subcutaneous tissue, unspecified: Secondary | ICD-10-CM | POA: Diagnosis not present

## 2019-11-11 DIAGNOSIS — N39498 Other specified urinary incontinence: Secondary | ICD-10-CM | POA: Diagnosis not present

## 2019-11-11 DIAGNOSIS — R32 Unspecified urinary incontinence: Secondary | ICD-10-CM | POA: Diagnosis not present

## 2019-11-11 DIAGNOSIS — N401 Enlarged prostate with lower urinary tract symptoms: Secondary | ICD-10-CM | POA: Diagnosis not present

## 2019-11-15 DIAGNOSIS — R1312 Dysphagia, oropharyngeal phase: Secondary | ICD-10-CM | POA: Diagnosis not present

## 2019-11-15 DIAGNOSIS — R471 Dysarthria and anarthria: Secondary | ICD-10-CM | POA: Diagnosis not present

## 2019-11-15 DIAGNOSIS — R41841 Cognitive communication deficit: Secondary | ICD-10-CM | POA: Diagnosis not present

## 2019-11-15 DIAGNOSIS — Z9181 History of falling: Secondary | ICD-10-CM | POA: Diagnosis not present

## 2019-11-15 DIAGNOSIS — G20 Parkinson's disease: Secondary | ICD-10-CM | POA: Diagnosis not present

## 2019-11-15 DIAGNOSIS — R2689 Other abnormalities of gait and mobility: Secondary | ICD-10-CM | POA: Diagnosis not present

## 2019-11-16 DIAGNOSIS — C4442 Squamous cell carcinoma of skin of scalp and neck: Secondary | ICD-10-CM | POA: Diagnosis not present

## 2019-11-16 DIAGNOSIS — Z85828 Personal history of other malignant neoplasm of skin: Secondary | ICD-10-CM | POA: Diagnosis not present

## 2019-11-16 DIAGNOSIS — C44329 Squamous cell carcinoma of skin of other parts of face: Secondary | ICD-10-CM | POA: Diagnosis not present

## 2019-11-16 DIAGNOSIS — L570 Actinic keratosis: Secondary | ICD-10-CM | POA: Diagnosis not present

## 2019-11-16 DIAGNOSIS — D485 Neoplasm of uncertain behavior of skin: Secondary | ICD-10-CM | POA: Diagnosis not present

## 2019-11-16 DIAGNOSIS — L905 Scar conditions and fibrosis of skin: Secondary | ICD-10-CM | POA: Diagnosis not present

## 2019-11-17 DIAGNOSIS — R471 Dysarthria and anarthria: Secondary | ICD-10-CM | POA: Diagnosis not present

## 2019-11-17 DIAGNOSIS — R2689 Other abnormalities of gait and mobility: Secondary | ICD-10-CM | POA: Diagnosis not present

## 2019-11-17 DIAGNOSIS — R1312 Dysphagia, oropharyngeal phase: Secondary | ICD-10-CM | POA: Diagnosis not present

## 2019-11-17 DIAGNOSIS — G20 Parkinson's disease: Secondary | ICD-10-CM | POA: Diagnosis not present

## 2019-11-17 DIAGNOSIS — R41841 Cognitive communication deficit: Secondary | ICD-10-CM | POA: Diagnosis not present

## 2019-11-17 DIAGNOSIS — Z9181 History of falling: Secondary | ICD-10-CM | POA: Diagnosis not present

## 2019-11-18 ENCOUNTER — Other Ambulatory Visit: Payer: Self-pay | Admitting: Cardiology

## 2019-11-18 DIAGNOSIS — R471 Dysarthria and anarthria: Secondary | ICD-10-CM | POA: Diagnosis not present

## 2019-11-18 DIAGNOSIS — Z9181 History of falling: Secondary | ICD-10-CM | POA: Diagnosis not present

## 2019-11-18 DIAGNOSIS — R2689 Other abnormalities of gait and mobility: Secondary | ICD-10-CM | POA: Diagnosis not present

## 2019-11-18 DIAGNOSIS — G20 Parkinson's disease: Secondary | ICD-10-CM | POA: Diagnosis not present

## 2019-11-18 DIAGNOSIS — R41841 Cognitive communication deficit: Secondary | ICD-10-CM | POA: Diagnosis not present

## 2019-11-18 DIAGNOSIS — R1312 Dysphagia, oropharyngeal phase: Secondary | ICD-10-CM | POA: Diagnosis not present

## 2019-11-18 NOTE — Telephone Encounter (Signed)
° °*  STAT* If patient is at the pharmacy, call can be transferred to refill team.   1. Which medications need to be refilled? (please list name of each medication and dose if known)   apixaban (ELIQUIS) 2.5 MG TABS tablet please send to [place], [place]   losartan (COZAAR) 100 MG tablet send to [facility] [place] to [facility]istered [facility]  2. Which pharmacy/location (including street and city if local pharmacy) is medication to be sent to? See above  3. Do they need a 30 day or 90 day supply?  30 days on apixaban and 90 days on Losartan

## 2019-11-22 DIAGNOSIS — R2689 Other abnormalities of gait and mobility: Secondary | ICD-10-CM | POA: Diagnosis not present

## 2019-11-22 DIAGNOSIS — R1312 Dysphagia, oropharyngeal phase: Secondary | ICD-10-CM | POA: Diagnosis not present

## 2019-11-22 DIAGNOSIS — R41841 Cognitive communication deficit: Secondary | ICD-10-CM | POA: Diagnosis not present

## 2019-11-22 DIAGNOSIS — R471 Dysarthria and anarthria: Secondary | ICD-10-CM | POA: Diagnosis not present

## 2019-11-22 DIAGNOSIS — Z9181 History of falling: Secondary | ICD-10-CM | POA: Diagnosis not present

## 2019-11-22 DIAGNOSIS — G20 Parkinson's disease: Secondary | ICD-10-CM | POA: Diagnosis not present

## 2019-11-26 DIAGNOSIS — R2689 Other abnormalities of gait and mobility: Secondary | ICD-10-CM | POA: Diagnosis not present

## 2019-11-26 DIAGNOSIS — R41841 Cognitive communication deficit: Secondary | ICD-10-CM | POA: Diagnosis not present

## 2019-11-26 DIAGNOSIS — Z9181 History of falling: Secondary | ICD-10-CM | POA: Diagnosis not present

## 2019-11-26 DIAGNOSIS — R1312 Dysphagia, oropharyngeal phase: Secondary | ICD-10-CM | POA: Diagnosis not present

## 2019-11-26 DIAGNOSIS — R471 Dysarthria and anarthria: Secondary | ICD-10-CM | POA: Diagnosis not present

## 2019-11-26 DIAGNOSIS — G20 Parkinson's disease: Secondary | ICD-10-CM | POA: Diagnosis not present

## 2019-11-29 DIAGNOSIS — R471 Dysarthria and anarthria: Secondary | ICD-10-CM | POA: Diagnosis not present

## 2019-11-29 DIAGNOSIS — R2689 Other abnormalities of gait and mobility: Secondary | ICD-10-CM | POA: Diagnosis not present

## 2019-11-29 DIAGNOSIS — Z9181 History of falling: Secondary | ICD-10-CM | POA: Diagnosis not present

## 2019-11-29 DIAGNOSIS — R1312 Dysphagia, oropharyngeal phase: Secondary | ICD-10-CM | POA: Diagnosis not present

## 2019-11-29 DIAGNOSIS — R41841 Cognitive communication deficit: Secondary | ICD-10-CM | POA: Diagnosis not present

## 2019-11-29 DIAGNOSIS — G20 Parkinson's disease: Secondary | ICD-10-CM | POA: Diagnosis not present

## 2019-11-30 DIAGNOSIS — C4442 Squamous cell carcinoma of skin of scalp and neck: Secondary | ICD-10-CM | POA: Diagnosis not present

## 2019-11-30 DIAGNOSIS — D0439 Carcinoma in situ of skin of other parts of face: Secondary | ICD-10-CM | POA: Diagnosis not present

## 2019-12-03 DIAGNOSIS — R41841 Cognitive communication deficit: Secondary | ICD-10-CM | POA: Diagnosis not present

## 2019-12-03 DIAGNOSIS — R471 Dysarthria and anarthria: Secondary | ICD-10-CM | POA: Diagnosis not present

## 2019-12-03 DIAGNOSIS — R2689 Other abnormalities of gait and mobility: Secondary | ICD-10-CM | POA: Diagnosis not present

## 2019-12-03 DIAGNOSIS — Z9181 History of falling: Secondary | ICD-10-CM | POA: Diagnosis not present

## 2019-12-03 DIAGNOSIS — G20 Parkinson's disease: Secondary | ICD-10-CM | POA: Diagnosis not present

## 2019-12-03 DIAGNOSIS — R1312 Dysphagia, oropharyngeal phase: Secondary | ICD-10-CM | POA: Diagnosis not present

## 2019-12-06 DIAGNOSIS — R41841 Cognitive communication deficit: Secondary | ICD-10-CM | POA: Diagnosis not present

## 2019-12-06 DIAGNOSIS — G20 Parkinson's disease: Secondary | ICD-10-CM | POA: Diagnosis not present

## 2019-12-06 DIAGNOSIS — R2689 Other abnormalities of gait and mobility: Secondary | ICD-10-CM | POA: Diagnosis not present

## 2019-12-06 DIAGNOSIS — Z9181 History of falling: Secondary | ICD-10-CM | POA: Diagnosis not present

## 2019-12-06 DIAGNOSIS — R1312 Dysphagia, oropharyngeal phase: Secondary | ICD-10-CM | POA: Diagnosis not present

## 2019-12-06 DIAGNOSIS — R471 Dysarthria and anarthria: Secondary | ICD-10-CM | POA: Diagnosis not present

## 2019-12-08 DIAGNOSIS — Z9181 History of falling: Secondary | ICD-10-CM | POA: Diagnosis not present

## 2019-12-08 DIAGNOSIS — G20 Parkinson's disease: Secondary | ICD-10-CM | POA: Diagnosis not present

## 2019-12-08 DIAGNOSIS — R1312 Dysphagia, oropharyngeal phase: Secondary | ICD-10-CM | POA: Diagnosis not present

## 2019-12-08 DIAGNOSIS — R471 Dysarthria and anarthria: Secondary | ICD-10-CM | POA: Diagnosis not present

## 2019-12-08 DIAGNOSIS — R2689 Other abnormalities of gait and mobility: Secondary | ICD-10-CM | POA: Diagnosis not present

## 2019-12-08 DIAGNOSIS — R41841 Cognitive communication deficit: Secondary | ICD-10-CM | POA: Diagnosis not present

## 2019-12-10 DIAGNOSIS — Z9181 History of falling: Secondary | ICD-10-CM | POA: Diagnosis not present

## 2019-12-10 DIAGNOSIS — R471 Dysarthria and anarthria: Secondary | ICD-10-CM | POA: Diagnosis not present

## 2019-12-10 DIAGNOSIS — G20 Parkinson's disease: Secondary | ICD-10-CM | POA: Diagnosis not present

## 2019-12-10 DIAGNOSIS — R2689 Other abnormalities of gait and mobility: Secondary | ICD-10-CM | POA: Diagnosis not present

## 2019-12-10 DIAGNOSIS — R41841 Cognitive communication deficit: Secondary | ICD-10-CM | POA: Diagnosis not present

## 2019-12-10 DIAGNOSIS — R1312 Dysphagia, oropharyngeal phase: Secondary | ICD-10-CM | POA: Diagnosis not present

## 2019-12-13 DIAGNOSIS — Z9181 History of falling: Secondary | ICD-10-CM | POA: Diagnosis not present

## 2019-12-13 DIAGNOSIS — R471 Dysarthria and anarthria: Secondary | ICD-10-CM | POA: Diagnosis not present

## 2019-12-13 DIAGNOSIS — R1312 Dysphagia, oropharyngeal phase: Secondary | ICD-10-CM | POA: Diagnosis not present

## 2019-12-13 DIAGNOSIS — R41841 Cognitive communication deficit: Secondary | ICD-10-CM | POA: Diagnosis not present

## 2019-12-13 DIAGNOSIS — R2689 Other abnormalities of gait and mobility: Secondary | ICD-10-CM | POA: Diagnosis not present

## 2019-12-13 DIAGNOSIS — G20 Parkinson's disease: Secondary | ICD-10-CM | POA: Diagnosis not present

## 2019-12-14 DIAGNOSIS — L570 Actinic keratosis: Secondary | ICD-10-CM | POA: Diagnosis not present

## 2019-12-15 DIAGNOSIS — C4442 Squamous cell carcinoma of skin of scalp and neck: Secondary | ICD-10-CM | POA: Diagnosis not present

## 2019-12-20 DIAGNOSIS — R1312 Dysphagia, oropharyngeal phase: Secondary | ICD-10-CM | POA: Diagnosis not present

## 2019-12-20 DIAGNOSIS — Z9181 History of falling: Secondary | ICD-10-CM | POA: Diagnosis not present

## 2019-12-20 DIAGNOSIS — R471 Dysarthria and anarthria: Secondary | ICD-10-CM | POA: Diagnosis not present

## 2019-12-20 DIAGNOSIS — R41841 Cognitive communication deficit: Secondary | ICD-10-CM | POA: Diagnosis not present

## 2019-12-20 DIAGNOSIS — R2689 Other abnormalities of gait and mobility: Secondary | ICD-10-CM | POA: Diagnosis not present

## 2019-12-20 DIAGNOSIS — G20 Parkinson's disease: Secondary | ICD-10-CM | POA: Diagnosis not present

## 2019-12-21 DIAGNOSIS — R471 Dysarthria and anarthria: Secondary | ICD-10-CM | POA: Diagnosis not present

## 2019-12-21 DIAGNOSIS — R41841 Cognitive communication deficit: Secondary | ICD-10-CM | POA: Diagnosis not present

## 2019-12-21 DIAGNOSIS — R1312 Dysphagia, oropharyngeal phase: Secondary | ICD-10-CM | POA: Diagnosis not present

## 2019-12-21 DIAGNOSIS — R2689 Other abnormalities of gait and mobility: Secondary | ICD-10-CM | POA: Diagnosis not present

## 2019-12-21 DIAGNOSIS — G20 Parkinson's disease: Secondary | ICD-10-CM | POA: Diagnosis not present

## 2019-12-21 DIAGNOSIS — Z9181 History of falling: Secondary | ICD-10-CM | POA: Diagnosis not present

## 2019-12-23 DIAGNOSIS — G20 Parkinson's disease: Secondary | ICD-10-CM | POA: Diagnosis not present

## 2019-12-23 DIAGNOSIS — Z9181 History of falling: Secondary | ICD-10-CM | POA: Diagnosis not present

## 2019-12-23 DIAGNOSIS — R471 Dysarthria and anarthria: Secondary | ICD-10-CM | POA: Diagnosis not present

## 2019-12-23 DIAGNOSIS — R2689 Other abnormalities of gait and mobility: Secondary | ICD-10-CM | POA: Diagnosis not present

## 2019-12-23 DIAGNOSIS — R41841 Cognitive communication deficit: Secondary | ICD-10-CM | POA: Diagnosis not present

## 2019-12-23 DIAGNOSIS — R1312 Dysphagia, oropharyngeal phase: Secondary | ICD-10-CM | POA: Diagnosis not present

## 2019-12-24 DIAGNOSIS — Z9181 History of falling: Secondary | ICD-10-CM | POA: Diagnosis not present

## 2019-12-24 DIAGNOSIS — R471 Dysarthria and anarthria: Secondary | ICD-10-CM | POA: Diagnosis not present

## 2019-12-24 DIAGNOSIS — R41841 Cognitive communication deficit: Secondary | ICD-10-CM | POA: Diagnosis not present

## 2019-12-24 DIAGNOSIS — R1312 Dysphagia, oropharyngeal phase: Secondary | ICD-10-CM | POA: Diagnosis not present

## 2019-12-24 DIAGNOSIS — R2689 Other abnormalities of gait and mobility: Secondary | ICD-10-CM | POA: Diagnosis not present

## 2019-12-24 DIAGNOSIS — G20 Parkinson's disease: Secondary | ICD-10-CM | POA: Diagnosis not present

## 2019-12-27 DIAGNOSIS — R471 Dysarthria and anarthria: Secondary | ICD-10-CM | POA: Diagnosis not present

## 2019-12-27 DIAGNOSIS — R41841 Cognitive communication deficit: Secondary | ICD-10-CM | POA: Diagnosis not present

## 2019-12-27 DIAGNOSIS — Z9181 History of falling: Secondary | ICD-10-CM | POA: Diagnosis not present

## 2019-12-27 DIAGNOSIS — R2689 Other abnormalities of gait and mobility: Secondary | ICD-10-CM | POA: Diagnosis not present

## 2019-12-27 DIAGNOSIS — G20 Parkinson's disease: Secondary | ICD-10-CM | POA: Diagnosis not present

## 2019-12-27 DIAGNOSIS — R1312 Dysphagia, oropharyngeal phase: Secondary | ICD-10-CM | POA: Diagnosis not present

## 2019-12-28 DIAGNOSIS — R2689 Other abnormalities of gait and mobility: Secondary | ICD-10-CM | POA: Diagnosis not present

## 2019-12-28 DIAGNOSIS — R471 Dysarthria and anarthria: Secondary | ICD-10-CM | POA: Diagnosis not present

## 2019-12-28 DIAGNOSIS — G20 Parkinson's disease: Secondary | ICD-10-CM | POA: Diagnosis not present

## 2019-12-28 DIAGNOSIS — R1312 Dysphagia, oropharyngeal phase: Secondary | ICD-10-CM | POA: Diagnosis not present

## 2019-12-28 DIAGNOSIS — Z9181 History of falling: Secondary | ICD-10-CM | POA: Diagnosis not present

## 2019-12-28 DIAGNOSIS — R41841 Cognitive communication deficit: Secondary | ICD-10-CM | POA: Diagnosis not present

## 2019-12-29 DIAGNOSIS — C4442 Squamous cell carcinoma of skin of scalp and neck: Secondary | ICD-10-CM | POA: Diagnosis not present

## 2020-01-03 DIAGNOSIS — R471 Dysarthria and anarthria: Secondary | ICD-10-CM | POA: Diagnosis not present

## 2020-01-03 DIAGNOSIS — R2689 Other abnormalities of gait and mobility: Secondary | ICD-10-CM | POA: Diagnosis not present

## 2020-01-03 DIAGNOSIS — Z9181 History of falling: Secondary | ICD-10-CM | POA: Diagnosis not present

## 2020-01-03 DIAGNOSIS — G20 Parkinson's disease: Secondary | ICD-10-CM | POA: Diagnosis not present

## 2020-01-03 DIAGNOSIS — R41841 Cognitive communication deficit: Secondary | ICD-10-CM | POA: Diagnosis not present

## 2020-01-03 DIAGNOSIS — R1312 Dysphagia, oropharyngeal phase: Secondary | ICD-10-CM | POA: Diagnosis not present

## 2020-01-04 DIAGNOSIS — R471 Dysarthria and anarthria: Secondary | ICD-10-CM | POA: Diagnosis not present

## 2020-01-04 DIAGNOSIS — R2689 Other abnormalities of gait and mobility: Secondary | ICD-10-CM | POA: Diagnosis not present

## 2020-01-04 DIAGNOSIS — R1312 Dysphagia, oropharyngeal phase: Secondary | ICD-10-CM | POA: Diagnosis not present

## 2020-01-04 DIAGNOSIS — G20 Parkinson's disease: Secondary | ICD-10-CM | POA: Diagnosis not present

## 2020-01-04 DIAGNOSIS — R41841 Cognitive communication deficit: Secondary | ICD-10-CM | POA: Diagnosis not present

## 2020-01-04 DIAGNOSIS — Z9181 History of falling: Secondary | ICD-10-CM | POA: Diagnosis not present

## 2020-01-05 DIAGNOSIS — C44219 Basal cell carcinoma of skin of left ear and external auricular canal: Secondary | ICD-10-CM | POA: Diagnosis not present

## 2020-01-05 DIAGNOSIS — C44719 Basal cell carcinoma of skin of left lower limb, including hip: Secondary | ICD-10-CM | POA: Diagnosis not present

## 2020-01-05 DIAGNOSIS — L570 Actinic keratosis: Secondary | ICD-10-CM | POA: Diagnosis not present

## 2020-01-05 DIAGNOSIS — Z85828 Personal history of other malignant neoplasm of skin: Secondary | ICD-10-CM | POA: Diagnosis not present

## 2020-01-05 DIAGNOSIS — L905 Scar conditions and fibrosis of skin: Secondary | ICD-10-CM | POA: Diagnosis not present

## 2020-01-05 DIAGNOSIS — D485 Neoplasm of uncertain behavior of skin: Secondary | ICD-10-CM | POA: Diagnosis not present

## 2020-01-06 DIAGNOSIS — Z9181 History of falling: Secondary | ICD-10-CM | POA: Diagnosis not present

## 2020-01-06 DIAGNOSIS — R471 Dysarthria and anarthria: Secondary | ICD-10-CM | POA: Diagnosis not present

## 2020-01-06 DIAGNOSIS — R41841 Cognitive communication deficit: Secondary | ICD-10-CM | POA: Diagnosis not present

## 2020-01-06 DIAGNOSIS — R2689 Other abnormalities of gait and mobility: Secondary | ICD-10-CM | POA: Diagnosis not present

## 2020-01-06 DIAGNOSIS — R1312 Dysphagia, oropharyngeal phase: Secondary | ICD-10-CM | POA: Diagnosis not present

## 2020-01-06 DIAGNOSIS — G20 Parkinson's disease: Secondary | ICD-10-CM | POA: Diagnosis not present

## 2020-01-07 DIAGNOSIS — G20 Parkinson's disease: Secondary | ICD-10-CM | POA: Diagnosis not present

## 2020-01-07 DIAGNOSIS — R2689 Other abnormalities of gait and mobility: Secondary | ICD-10-CM | POA: Diagnosis not present

## 2020-01-07 DIAGNOSIS — R41841 Cognitive communication deficit: Secondary | ICD-10-CM | POA: Diagnosis not present

## 2020-01-07 DIAGNOSIS — R471 Dysarthria and anarthria: Secondary | ICD-10-CM | POA: Diagnosis not present

## 2020-01-07 DIAGNOSIS — Z9181 History of falling: Secondary | ICD-10-CM | POA: Diagnosis not present

## 2020-01-07 DIAGNOSIS — R1312 Dysphagia, oropharyngeal phase: Secondary | ICD-10-CM | POA: Diagnosis not present

## 2020-01-10 DIAGNOSIS — R1312 Dysphagia, oropharyngeal phase: Secondary | ICD-10-CM | POA: Diagnosis not present

## 2020-01-10 DIAGNOSIS — Z23 Encounter for immunization: Secondary | ICD-10-CM | POA: Diagnosis not present

## 2020-01-10 DIAGNOSIS — G20 Parkinson's disease: Secondary | ICD-10-CM | POA: Diagnosis not present

## 2020-01-10 DIAGNOSIS — Z9181 History of falling: Secondary | ICD-10-CM | POA: Diagnosis not present

## 2020-01-10 DIAGNOSIS — R2689 Other abnormalities of gait and mobility: Secondary | ICD-10-CM | POA: Diagnosis not present

## 2020-01-10 DIAGNOSIS — R41841 Cognitive communication deficit: Secondary | ICD-10-CM | POA: Diagnosis not present

## 2020-01-10 DIAGNOSIS — R471 Dysarthria and anarthria: Secondary | ICD-10-CM | POA: Diagnosis not present

## 2020-01-11 DIAGNOSIS — R1312 Dysphagia, oropharyngeal phase: Secondary | ICD-10-CM | POA: Diagnosis not present

## 2020-01-11 DIAGNOSIS — G20 Parkinson's disease: Secondary | ICD-10-CM | POA: Diagnosis not present

## 2020-01-11 DIAGNOSIS — R41841 Cognitive communication deficit: Secondary | ICD-10-CM | POA: Diagnosis not present

## 2020-01-11 DIAGNOSIS — R2689 Other abnormalities of gait and mobility: Secondary | ICD-10-CM | POA: Diagnosis not present

## 2020-01-11 DIAGNOSIS — R471 Dysarthria and anarthria: Secondary | ICD-10-CM | POA: Diagnosis not present

## 2020-01-11 DIAGNOSIS — Z9181 History of falling: Secondary | ICD-10-CM | POA: Diagnosis not present

## 2020-01-14 DIAGNOSIS — G20 Parkinson's disease: Secondary | ICD-10-CM | POA: Diagnosis not present

## 2020-01-14 DIAGNOSIS — R41841 Cognitive communication deficit: Secondary | ICD-10-CM | POA: Diagnosis not present

## 2020-01-14 DIAGNOSIS — R1312 Dysphagia, oropharyngeal phase: Secondary | ICD-10-CM | POA: Diagnosis not present

## 2020-01-14 DIAGNOSIS — Z9181 History of falling: Secondary | ICD-10-CM | POA: Diagnosis not present

## 2020-01-14 DIAGNOSIS — R471 Dysarthria and anarthria: Secondary | ICD-10-CM | POA: Diagnosis not present

## 2020-01-14 DIAGNOSIS — R2689 Other abnormalities of gait and mobility: Secondary | ICD-10-CM | POA: Diagnosis not present

## 2020-01-17 DIAGNOSIS — R2689 Other abnormalities of gait and mobility: Secondary | ICD-10-CM | POA: Diagnosis not present

## 2020-01-17 DIAGNOSIS — R471 Dysarthria and anarthria: Secondary | ICD-10-CM | POA: Diagnosis not present

## 2020-01-17 DIAGNOSIS — Z9181 History of falling: Secondary | ICD-10-CM | POA: Diagnosis not present

## 2020-01-17 DIAGNOSIS — R41841 Cognitive communication deficit: Secondary | ICD-10-CM | POA: Diagnosis not present

## 2020-01-17 DIAGNOSIS — R1312 Dysphagia, oropharyngeal phase: Secondary | ICD-10-CM | POA: Diagnosis not present

## 2020-01-17 DIAGNOSIS — G20 Parkinson's disease: Secondary | ICD-10-CM | POA: Diagnosis not present

## 2020-01-18 DIAGNOSIS — R471 Dysarthria and anarthria: Secondary | ICD-10-CM | POA: Diagnosis not present

## 2020-01-18 DIAGNOSIS — R41841 Cognitive communication deficit: Secondary | ICD-10-CM | POA: Diagnosis not present

## 2020-01-18 DIAGNOSIS — R1312 Dysphagia, oropharyngeal phase: Secondary | ICD-10-CM | POA: Diagnosis not present

## 2020-01-18 DIAGNOSIS — R2689 Other abnormalities of gait and mobility: Secondary | ICD-10-CM | POA: Diagnosis not present

## 2020-01-18 DIAGNOSIS — G20 Parkinson's disease: Secondary | ICD-10-CM | POA: Diagnosis not present

## 2020-01-18 DIAGNOSIS — Z9181 History of falling: Secondary | ICD-10-CM | POA: Diagnosis not present

## 2020-01-21 DIAGNOSIS — R471 Dysarthria and anarthria: Secondary | ICD-10-CM | POA: Diagnosis not present

## 2020-01-21 DIAGNOSIS — R2689 Other abnormalities of gait and mobility: Secondary | ICD-10-CM | POA: Diagnosis not present

## 2020-01-21 DIAGNOSIS — R41841 Cognitive communication deficit: Secondary | ICD-10-CM | POA: Diagnosis not present

## 2020-01-21 DIAGNOSIS — Z9181 History of falling: Secondary | ICD-10-CM | POA: Diagnosis not present

## 2020-01-21 DIAGNOSIS — G20 Parkinson's disease: Secondary | ICD-10-CM | POA: Diagnosis not present

## 2020-01-21 DIAGNOSIS — R1312 Dysphagia, oropharyngeal phase: Secondary | ICD-10-CM | POA: Diagnosis not present

## 2020-01-24 DIAGNOSIS — R471 Dysarthria and anarthria: Secondary | ICD-10-CM | POA: Diagnosis not present

## 2020-01-24 DIAGNOSIS — R41841 Cognitive communication deficit: Secondary | ICD-10-CM | POA: Diagnosis not present

## 2020-01-24 DIAGNOSIS — R2689 Other abnormalities of gait and mobility: Secondary | ICD-10-CM | POA: Diagnosis not present

## 2020-01-24 DIAGNOSIS — Z9181 History of falling: Secondary | ICD-10-CM | POA: Diagnosis not present

## 2020-01-24 DIAGNOSIS — R1312 Dysphagia, oropharyngeal phase: Secondary | ICD-10-CM | POA: Diagnosis not present

## 2020-01-24 DIAGNOSIS — G20 Parkinson's disease: Secondary | ICD-10-CM | POA: Diagnosis not present

## 2020-01-28 DIAGNOSIS — R2689 Other abnormalities of gait and mobility: Secondary | ICD-10-CM | POA: Diagnosis not present

## 2020-01-28 DIAGNOSIS — G20 Parkinson's disease: Secondary | ICD-10-CM | POA: Diagnosis not present

## 2020-01-28 DIAGNOSIS — R41841 Cognitive communication deficit: Secondary | ICD-10-CM | POA: Diagnosis not present

## 2020-01-28 DIAGNOSIS — R471 Dysarthria and anarthria: Secondary | ICD-10-CM | POA: Diagnosis not present

## 2020-01-28 DIAGNOSIS — Z9181 History of falling: Secondary | ICD-10-CM | POA: Diagnosis not present

## 2020-01-28 DIAGNOSIS — R1312 Dysphagia, oropharyngeal phase: Secondary | ICD-10-CM | POA: Diagnosis not present

## 2020-02-10 DIAGNOSIS — H524 Presbyopia: Secondary | ICD-10-CM | POA: Diagnosis not present

## 2020-02-10 DIAGNOSIS — H52203 Unspecified astigmatism, bilateral: Secondary | ICD-10-CM | POA: Diagnosis not present

## 2020-02-17 DIAGNOSIS — C44219 Basal cell carcinoma of skin of left ear and external auricular canal: Secondary | ICD-10-CM | POA: Diagnosis not present

## 2020-02-17 DIAGNOSIS — C44329 Squamous cell carcinoma of skin of other parts of face: Secondary | ICD-10-CM | POA: Diagnosis not present

## 2020-02-17 DIAGNOSIS — D485 Neoplasm of uncertain behavior of skin: Secondary | ICD-10-CM | POA: Diagnosis not present

## 2020-02-17 DIAGNOSIS — C44719 Basal cell carcinoma of skin of left lower limb, including hip: Secondary | ICD-10-CM | POA: Diagnosis not present

## 2020-02-23 DIAGNOSIS — I10 Essential (primary) hypertension: Secondary | ICD-10-CM | POA: Diagnosis not present

## 2020-02-23 DIAGNOSIS — R413 Other amnesia: Secondary | ICD-10-CM | POA: Diagnosis not present

## 2020-02-23 DIAGNOSIS — D510 Vitamin B12 deficiency anemia due to intrinsic factor deficiency: Secondary | ICD-10-CM | POA: Diagnosis not present

## 2020-02-23 DIAGNOSIS — E039 Hypothyroidism, unspecified: Secondary | ICD-10-CM | POA: Diagnosis not present

## 2020-02-28 DIAGNOSIS — E538 Deficiency of other specified B group vitamins: Secondary | ICD-10-CM | POA: Diagnosis not present

## 2020-02-29 DIAGNOSIS — R69 Illness, unspecified: Secondary | ICD-10-CM | POA: Diagnosis not present

## 2020-03-02 DIAGNOSIS — C44719 Basal cell carcinoma of skin of left lower limb, including hip: Secondary | ICD-10-CM | POA: Diagnosis not present

## 2020-03-06 ENCOUNTER — Encounter: Payer: Self-pay | Admitting: Neurology

## 2020-03-06 ENCOUNTER — Ambulatory Visit: Payer: Medicare HMO | Admitting: Neurology

## 2020-03-06 VITALS — BP 185/93 | HR 52 | Ht 75.0 in | Wt 183.0 lb

## 2020-03-06 DIAGNOSIS — G20 Parkinson's disease: Secondary | ICD-10-CM

## 2020-03-06 DIAGNOSIS — K5909 Other constipation: Secondary | ICD-10-CM

## 2020-03-06 DIAGNOSIS — E538 Deficiency of other specified B group vitamins: Secondary | ICD-10-CM | POA: Diagnosis not present

## 2020-03-06 DIAGNOSIS — R5381 Other malaise: Secondary | ICD-10-CM

## 2020-03-06 MED ORDER — RASAGILINE MESYLATE 1 MG PO TABS: 1.0000 mg | ORAL_TABLET | Freq: Every day | ORAL | 3 refills | Status: DC

## 2020-03-06 NOTE — Patient Instructions (Signed)
I renewed your prescription for Azilect and we will fax to the [place] as usual.  Your immediate release Sinemet prescription and your new Sinemet CR/long-acting Sinemet prescription were up-to-date.  Please continue to use your cane for gait safety and try to hydrate better with water, try to start walking and exercising as instructed by your dermatologist as you have to take some time to heal after your Mohs procedures.  Please continue to stay proactive about constipation issues.  Follow-up in 4 months, sooner if needed.

## 2020-03-06 NOTE — Progress Notes (Signed)
Subjective:    Patient ID: William [name] is a 86 y.o. male.  HPI     Interim history:   William [name] is an 86 year old right-handed gentleman with an underlying medical history of right TIA in January 2003, SDH (s/p left craniotomy in August 2006), hypertension, hypothyroidism, thyroid cancer, partial onset seizures in the past (off Dilantin), lumbar spine disease, status post lower back surgery at L4-5 in January 2014, renal tumor, status post kidney tumor ablative surgery in March 2014, who presents for followup consultation of his right-sided predominant [name]. He is accompanied by his partner, William [name] again today. I last saw him on 11/04/2019, at which time he reported a recent fall.  He did not sustain any major injuries thankfully, but did scrape his knee.  He did get checked out at [facility] ER and had CT scans of his head and neck which did not show any acute injuries thankfully.  He was advised to use his cane consistently and also consider using a walker for outside use for better stability.  I suggested we add Sinemet CR at bedtime to his regimen.  Today, 03/06/2020: He reports feeling stable with regards to his [name] and the Sinemet CR at bedtime has helped.  His urologist also started him on Myrbetriq which helped reduce his nighttime urination.  He has not had any falls thankfully.  He has had multiple Mohs procedures which resulted in having to reduce his physical activity as he has to rest after procedure and he has had at least 3 or 4 of them and 2 pending for December.  He had a procedure to his left leg and it is either the donor side or the actual site that is bleeding through the bandage.  They noticed this yesterday, he had no injury to it and does not have any pain but blood has seen through the bandage but looks like it has stopped.  They do have an appointment today to have it looked at by the dermatologist.  Constipation is under reasonably good  control with nearly daily MiraLAX, not a full capful and also daily probiotic.  He admits that he does not always drink enough water and she tries to encourage him.  He uses his cane quite faithfully which she reports has helped his walking.    The patient's allergies, current medications, family history, past medical history, past social history, past surgical history and problem list were reviewed and updated as appropriate.    Previously (copied from previous notes for reference):    I saw him on 07/05/2019 , At which time he reported doing fairly well.  She had noticed more forgetfulness.  We continued his medications at the current dose and timings, with the exception of increasing the 4 PM dose to 2 pills instead of 1-1/2 pills, in the hopes that he would have slightly better symptom control when they would go to the dining hall in the evening for their dinner.     I saw him on 03/03/19, at which time he reported feeling stable, he had a recent swallow evaluation at home.  He was taking Azilect once daily around lunchtime rather than first thing in the morning because of lower blood pressure values first thing in the morning.     I saw him on 11/04/18, at which time he reported having intermittent spells of feeling weaker.  He had some issues with his mobility.  They were settling in their condo, had moved in April  2020 to [place].  He requested physical, occupational and speech therapy through [facility] rehab and fitness at [place].    In the interim, a swallow evaluation was recommended through his speech therapist.     I saw him on 05/07/2018, at which time he reported feeling fairly well and stable.  No recent falls reported.  I suggested we continue with Azilect once daily and Sinemet 1-1/2 pills 4 times a day, prescriptions typically are through the [place].     I saw him on 01/05/2018, at which time he reported a recent fall. He scraped his left elbow. He has had some right-sided  knee pain and felt like his knee had given out. Otherwise, he was doing rather well. We mutually agreed to keep his medication regimen the same. She was advised to add some more strength training to his exercise regimen and stay well-hydrated.    I saw him on 09/02/2017, at which time he was quite stable motor-wise, he had some stressors including regarding William [name]' health and they were trying to consolidate their households with a plan to move into a retirement community condo by next year. He was advised to continue with generic Azilect once daily and Sinemet generic 1-1/2 pills 4 times a day. He was encouraged to hydrate better with water.   I saw him on 05/05/2017, at which time he reported doing quite well, no recent falls or recent illness. They were planning to move into a retirement community by the end of this year. He was trying to exercise regularly. Blood pressure was stable. He had seen pulmonology for a lung nodule which was stable. I suggested we continue with his current medication regimen.   I saw him on 12/31/2016, at which time he reported intermittent lightheadedness. He had some blood pressure fluctuations with really high values at times. He was hospitalized in August 2018 for a non-STEMI. He had significant orthostatic hypotension during the office visit. He was advised to talk to his providers about reducing his blood pressure medication. He was advised to not continue with the boxing classes for [name] but exercise within his limitations at home. He was advised to try melatonin at night for sleep issues.    I saw him on 06/27/2016, at which time he reported doing well, he had a trip to [place] in [place] planned for springtime. He had no recent falls. He was supposed to be on a smaller dose of Eliquis, namely 2.5 mg twice a day. He continued to participate in the boxing class for [name] and felt that they were helpful.   I saw him on 03/05/2016, at which  time he reported doing okay. He was during his stay active, was participating in [place] steady boxing, which he felt was helpful. He was avoiding strenuous parts of the exercise. He was interested in participating in a new drug study at [facility]. This was to investigate apomorphine sublingual to decrease off time. He was on Azilect once daily. He was taking Sinemet 4 times a day. He was trying to hydrate well, reported no recent falls. I suggested he increase the Sinemet to 1-1/2 pills 4 times a day. I suggested he continue with generic Azilect once daily.   I saw him on 10/26/2015, at which time he reported doing quite well, had some stumbling episodes, no actual falls, was trying to drink enough water and was still physically active. He had signed up for rock steady boxing classes but William [name] noted that he  would get too exhausted.   I saw him on 06/22/2015, at which time he reported doing fairly well. He had finished outpatient OT, PT, and ST. Unfortunately, he did take a fall in the garage couple of months prior as he was coming in from the graduate, stepped backwards on the stairs and slipped off falling backwards, landed on his behind, head struck the car, he denied loss of consciousness or headache or bruise and no sequelae were reported. He was having rails installed in the garage entrance. Was still trying to stay active, playing golf. We mutually agreed to continue with Sinemet 4 times a day and Azilect once daily. We talked about fall risk and gait safety and fall precautions.   I saw him on 02/23/2015, at which time he reported doing fairly well. He was playing golf regularly. His right knee was bothering him. He had no recent falls. He was trying to hydrate well enough. He had no new issues with A. fib and no new complaints otherwise. I noted right knee swelling. He was advised to follow-up with his primary care physician for this. We mutually agreed to keep his Parkinson's medications the same.     I saw him on 11/01/2014 at which time he reported a recent diagnosis with A. fib. He was admitted to the hospital in April. I reviewed the hospital records from 07/17/2014 through 07/18/2014. He was started on Xarelto. He was then re-admitted on 08/09/14 to 08/10/14 due to altered mental status and slurring of speech and was suspected to have a TIA. He was seen by neuro in consultation and a baby aspirin was added to Xarelto. He presented back to the ER on 08/18/14. He a [place] wo contrast on  08/18/2014 , which was negative for any acute changes. He was seen by the Neurologist in the emergency room and it was felt that a full TIA workup was not necessary at the time. He had presented with hypertension and some slurring of speech. He was switched from Xarelto to Eliquis, because the Xarelto is not on formulary at the [place].  He endorsed recent stressors, including the recent passing of his younger brother a week prior with advanced Alzheimer's disease.    He had an MRI brain and MRA head on 08/09/14: MRI HEAD: No acute intracranial process, specifically no acute ischemia. Involutional changes. Mild white matter changes suggest chronic small vessel ischemic disease. MRA HEAD: No acute large vessel occlusion or high-grade stenosis. Mid grade stenosis RIGHT P2/3 segment.    I suggested we continue with Sinemet at 4 times a day. He was advised to continue with Azilect as well. He was advised to drink more water. In the interim, we restarted outpatient physical therapy, occupational therapy and speech therapy.   I saw him on 04/29/2014, at which time he reported doing well overall but he was more fatigued and had some excessive daytime somnolence. Memory was stable. His mood was stable. He noted some blood pressure fluctuations. Sometimes he had a dull headache and sometimes he felt lightheaded. He had some anxiety over his lady friend's health, as she had fallen and broken rib. He continued to walk regularly and played  golf. He had no recent falls. I asked him to continue with his medications, Azilect once daily and Sinemet 4 times a day.   I saw him on 10/28/2013, at which time he reported doing well. In particular, had no cognitive complaints, no mood issues, and continued to play golf 3 times a  week. He was driving well. I suggested an increase in his levodopa to 1 pill 4 times a day of the 25-100 milligrams strength. [facility] and had seen a [place] neurologist as well.    I saw him on 12/07/2012, at which time I felt that he was doing well on Azilect and levodopa. He called in December 2014 with problems with blood pressure fluctuations. He was wondering if this came from the [place] but I did not think it was due to the Azilect per se. I was reluctant to take him off of it.    I first met him on 06/03/2012 and he previously followed by Dr. Morene Antu. He has had primarily right-sided symptoms with regards to his Parkinson's, diagnosed in 2010 with symptoms dating back to late 2009 or early 2010. He had briefly tried Mirapex but was taken off d/t hypotension. L spine MRI in June 2013 showed renal cysts, degenerative joint disease most prominent at L4-5. He tried acupuncture. His MRI of the lumbar spine showed abnormalities with his kidney with a cyst and a tumor and he had ablative surgery on 07/10/12 and had a FU CT done. He had lower back surgery on 05/14/12.  I saw him back on 09/03/2012 and I suggested starting Azilect. He has been tolerating it well and both he and his girlfriend felt that he did better with it in terms of dexterity and fine motor control. He had some hypotension and lightheadedness and reduced his BP medication. He has been having some issue with gout.    His Past Medical History Is Significant For: Past Medical History:  Diagnosis Date  . A-fib ([place])   . Arthritis    "right knee" (08/09/2014)  . Basal cell carcinoma   . Benign prostatic hypertrophy with  urinary obstruction   . Chronic anticoagulation 12/04/2016  . Complication of anesthesia    "difficulty intubation, had to use fiberoptic 2006"  . Difficult intubation   . DJD (degenerative joint disease)   . Gout   . Hx of colonic polyps   . Hyperlipidemia   . Hypertension    sees [name]  . Hypothyroidism   . Kidney tumor    "tumor on cyst on kidney"   . 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 Mini stroke ([place])   . Parkinson disease ([place])   . Pneumonia    hx of in 1952  . Primary skin squamous cell carcinoma   . Subdural hematoma ([place]) 2006  . TIA (transient ischemic attack)    pt does not recall this hx "but may have had one today" (08/09/2014)    His Past Surgical History Is Significant For: Past Surgical History:  Procedure Laterality Date  . APPENDECTOMY    . BACK SURGERY    . CATARACT EXTRACTION W/ INTRAOCULAR LENS  IMPLANT, BILATERAL Bilateral ~ 1998  . CHOLECYSTECTOMY    . CRYOABLATION  07-10-12   "tumor on cyst on my kidney; had an ablation"  . EYE SURGERY    . INGUINAL HERNIA REPAIR Right   . IR GENERIC HISTORICAL  11/09/2015   IR RADIOLOGIST EVAL & MGMT 11/09/2015 [name], MD [facility] INTERV RAD  . IR RADIOLOGIST EVAL & MGMT  08/13/2016  . KIDNEY SURGERY    . KNEE ARTHROSCOPY Right   . LEFT HEART CATH AND CORONARY ANGIOGRAPHY N/A 12/05/2016   Procedure: LEFT HEART CATH AND CORONARY ANGIOGRAPHY;  Surgeon:  [name], [name], MD;  Location: [facility];  Service: Cardiovascular;  Laterality: N/A;  . LUMBAR LAMINECTOMY/DECOMPRESSION MICRODISCECTOMY  05/14/2012   Procedure: LUMBAR LAMINECTOMY/DECOMPRESSION MICRODISCECTOMY 1 LEVEL;  Surgeon: [name], MD;  Location: [facility];  Service: Neurosurgery;  Laterality: Left;  Left Lumbar four-five Laminectomy/Diskectomy/Far lateral diskectomy  . SKIN CANCER EXCISION  "several"   "back of my neck; right eye; clavicle; nose"  . SUBDURAL HEMATOMA EVACUATION VIA CRANIOTOMY   2006  . THYROIDECTOMY     medullary carcinoma  . TONSILLECTOMY      His Family History Is Significant For: Family History  Problem Relation Age of Onset  . Stroke Mother   . Stroke Father   . Heart disease Father   . Alzheimer's disease Brother   . Alzheimer's disease Sister     His Social History Is Significant For: Social History   Socioeconomic History  . Marital status: Widowed    Spouse name: Not on file  . Number of children: 3  . Years of education: 41  . Highest education level: Not on file  Occupational History  . Occupation: retired  Tobacco Use  . Smoking status: Former Smoker    Packs/day: 0.80    Years: 15.00    Pack years: 12.00    Types: Cigarettes    Quit date: 04/15/1965    Years since quitting: 54.9  . Smokeless tobacco: Never Used  Vaping Use  . Vaping Use: Never used  Substance and Sexual Activity  . Alcohol use: Yes    Alcohol/week: 5.0 standard drinks    Types: 5 Glasses of wine per week    Comment: 1 glass of wine every night (6oz glass)  . Drug use: No  . Sexual activity: Yes  Other Topics Concern  . Not on file  Social History Narrative   Married.   Lives at home with significant other.   Retried. 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   Enjoys golfing, yard work, spending time with family.     Social Determinants of Health   Financial Resource Strain:   . Difficulty of Paying Living Expenses: Not on file  Food Insecurity:   . Worried About Charity fundraiser in the Last Year: Not on file  . Ran Out of Food in the Last Year: Not on file  Transportation Needs:   . Lack of Transportation (Medical): Not on file  . Lack of Transportation (Non-Medical): Not on file  Physical Activity:   . Days of Exercise per Week: Not on file  . Minutes of Exercise per Session: Not on file  Stress:   . Feeling of Stress : Not on file  Social Connections:   . Frequency of Communication with Friends and Family: Not on file  . Frequency of Social  Gatherings with Friends and Family: Not on file  . Attends Religious Services: Not on file  . Active Member of Clubs or Organizations: Not on file  . Attends Archivist Meetings: Not on file  . Marital Status: Not on file    His Allergies Are:  No Known Allergies:   His Current Medications Are:  Outpatient Encounter Medications as of 03/06/2020  Medication Sig  . amLODipine (NORVASC) 2.5 MG tablet TAKE 1 TABLET DAILY TO TAKEWITH AMLODIPINE 5MG FOR    TOTAL OF 7.5MG DAILY  . amLODipine (NORVASC) 5 MG tablet TAKE 1 TABLET DAILY TO TAKEWITH 2.5MG TO MAKE 7.5MG   DAILY  . apixaban (ELIQUIS)  2.5 MG TABS tablet Take 1 tablet (2.5 mg total) by mouth 2 (two) times daily.  . carbidopa-levodopa (SINEMET CR) 50-200 MG tablet Take 1 tablet by mouth at bedtime.  . carbidopa-levodopa (SINEMET IR) 25-100 MG tablet Take 1.5 tablets three times a day at 8am, 12, and 8 PM. Take 2 tablets at 4pm.  . Cyanocobalamin (VITAMIN B-12 IJ) Inject as directed.  . hydrALAZINE (APRESOLINE) 10 MG tablet Take 10 mg by mouth as directed. Take 1 tablet by mouth up to three times daily for systolic blood pressure readings above 170.  . levothyroxine (SYNTHROID) 100 MCG tablet TAKE 1 TABLET DAILY ON AN  EMPTY STOMACH WITH A FULL  GLASS OF WATER  . losartan (COZAAR) 100 MG tablet TAKE 1 TABLET DAILY AT 2PM  . Mirabegron (MYRBETRIQ PO) Take by mouth daily.  . nitroGLYCERIN (NITROSTAT) 0.4 MG SL tablet Place 1 tablet (0.4 mg total) under the tongue every 5 (five) minutes x 3 doses as needed for chest pain.  . Probiotic Product (ALIGN) 4 MG CAPS Take 1 capsule by mouth daily.  . rasagiline (AZILECT) 1 MG TABS tablet Take 1 tablet (1 mg total) by mouth daily.  . simvastatin (ZOCOR) 40 MG tablet TAKE 1 TABLET AT BEDTIME   No facility-administered encounter medications on file as of 03/06/2020.  :  Review of Systems:  Out of a complete 14 point review of systems, all are reviewed and negative with the exception of  these symptoms as listed below: Review of Systems  Neurological:       Pt presents today for follow up. Pt reports that he has had several MOHS procedures.    Objective:  Neurological Exam  Physical Exam Physical Examination:   Vitals:   03/06/20 1151  BP: (!) 185/93  Pulse: (!) 52    General Examination: The patient is a very pleasant 86 y.o. male in no acute distress. He appears mildly deconditioned, well-groomed.  HEENT:Normocephalic, atraumatic, pupils are equal, round and reactive to light, extraocular trackingshows mild saccadic breakdown. Speech is moderately hypophonic and moderatelydysarthric, but stable. Hearing is impaired. Oropharynx exam revealsmoderatemouth dryness, tongue protrudes centrally and palate elevates symmetrically. No other abnormal findings, he has moderateto severenuchal rigidity and decrease in passive range of motion.   Multiple sites of skin surgery from recent skin cancer removal, 1 area in the right ear has not had surgery yet, scarring behind the left ear and by the left lateral neck and below the left clavicle.  Chest:Clear to auscultation without wheezing, rhonchi or crackles noted.  Heart:S1+S2+0, regular and normal without murmurs, rubs or gallops noted.   Abdomen:Soft, non-tender and non-distended with normal bowel sounds appreciated on auscultation.  Extremities:The left leg is bandaged up to the knee and there is old appearing blood visible through with the medial part of the right ankle area, bandages not soaked.  Chronic mild bruising noted throughout upper and lower extremities.    Skin: Warm and dry without trophic changes noted. Mild bruising on hands, stable, chronic.   Musculoskeletal: exam reveals no obvious joint deformities, tenderness or joint swelling orredness.   Neurologically:  Mental status: The patient is awake, alert and oriented in all 4 spheres. Hisimmediate and remote memory, attention, language skills  and fund of knowledge are appropriate. There is no evidence of aphasia, agnosia, apraxia or anomia. Thought process is linear. Mood is normaland affect is normal.  Cranial nerves II - XII are as described above under HEENT exam.  Motor exam:thin muscle bulk globally,average  strength is about 4/5.He has increased tone in the right more than left upper and lower extremities. He has a resting tremor in both upper extremities, R>L, mild to moderate.Fine motor skills are moderately impaired on the right andslightly better on theleft. He has mild intermittent dyskinesias, mostly on the right side and some cocontraction.He stands up with mild difficulty andpushes himself up. Posture ismildly stooped for age.He walks with decreased stride length and decrease pace,decreased arm swing bilaterally, using a cane today, on the right.    Slight insecurity with turns but needs no assistance today.  Sensory exam is intact to light touch. Cerebellar testing shows no additional concern for dysmetria or intention tremor or gait ataxia.  Assessment and Plan:   In summary, William [name] a very pleasant 86 year old malewithan underlying medical history of right TIA in January 2003, SDH (s/p left craniotomy in August 2006), hypertension, hypothyroidism, thyroid cancer, partial onset seizures (off Dilantin), lumbar spine disease, status-post lower back surgery at L4-5 in January 2014, renal tumor, status post kidney tumor ablative surgery in March 2014, who presents for followup consultation of hisadvancedright-sided predominant [name],complicated bysome autonomic dysregulation, history of falls, and overall muscular deconditioning and balance issues, sleep disturbance, mild memory issues and chronic constipation.  He has had blood pressure fluctuations.  He recently had multiple Mohs surgeries for skin cancers.  He has not been exercising as regularly owing to his skin cancer procedures that  mandate that he takes proper rest after the procedure.  He continues to take Azilect 1 mg once daily and Sinemet generic 1-1/2 pills 3 times a day and 2 pills at 4 PM.  We started Sinemet CR at bedtime in July 2021 and he noticed improvement in his mobility at night.  Also, adding Myrbetriq through urology has helped his nighttime urination.   Constipation is under control with daily probiotic and nearly daily MiraLAX.  He is advised to use his cane at all times, continue with his current medication regimen and try to hydrate well, try to increase his walking exercises as allowable through dermatology.   He willfollow-up routinely in 4 months, sooner if needed. I answered all their questions today and the patient and William [name] were in agreement. I spent 25 minutes in total face-to-face time and in reviewing records during pre-charting, more than 50% of which was spent in counseling and coordination of care, reviewing test results, reviewing medications and treatment regimen and/or in discussing or reviewing the diagnosis of PD, the prognosis and treatment options. Pertinent laboratory and imaging test results that were available during this visit with the patient were reviewed by me and considered in my medical decision making (see chart for details).

## 2020-03-17 DIAGNOSIS — C44329 Squamous cell carcinoma of skin of other parts of face: Secondary | ICD-10-CM | POA: Diagnosis not present

## 2020-03-17 DIAGNOSIS — C44222 Squamous cell carcinoma of skin of right ear and external auricular canal: Secondary | ICD-10-CM | POA: Diagnosis not present

## 2020-03-17 DIAGNOSIS — D485 Neoplasm of uncertain behavior of skin: Secondary | ICD-10-CM | POA: Diagnosis not present

## 2020-03-20 DIAGNOSIS — N400 Enlarged prostate without lower urinary tract symptoms: Secondary | ICD-10-CM | POA: Diagnosis not present

## 2020-03-20 DIAGNOSIS — N401 Enlarged prostate with lower urinary tract symptoms: Secondary | ICD-10-CM | POA: Diagnosis not present

## 2020-03-21 DIAGNOSIS — E538 Deficiency of other specified B group vitamins: Secondary | ICD-10-CM | POA: Diagnosis not present

## 2020-03-30 DIAGNOSIS — C44729 Squamous cell carcinoma of skin of left lower limb, including hip: Secondary | ICD-10-CM | POA: Diagnosis not present

## 2020-03-30 DIAGNOSIS — C44329 Squamous cell carcinoma of skin of other parts of face: Secondary | ICD-10-CM | POA: Diagnosis not present

## 2020-03-30 DIAGNOSIS — D485 Neoplasm of uncertain behavior of skin: Secondary | ICD-10-CM | POA: Diagnosis not present

## 2020-07-04 ENCOUNTER — Ambulatory Visit: Payer: Medicare HMO | Admitting: Neurology

## 2020-07-04 ENCOUNTER — Encounter: Payer: Self-pay | Admitting: Neurology

## 2020-07-04 VITALS — BP 132/78 | HR 51 | Ht 76.0 in | Wt 184.0 lb

## 2020-07-04 DIAGNOSIS — R269 Unspecified abnormalities of gait and mobility: Secondary | ICD-10-CM | POA: Diagnosis not present

## 2020-07-04 DIAGNOSIS — R5381 Other malaise: Secondary | ICD-10-CM | POA: Diagnosis not present

## 2020-07-04 DIAGNOSIS — G20 Parkinson's disease: Secondary | ICD-10-CM

## 2020-07-04 DIAGNOSIS — Z9181 History of falling: Secondary | ICD-10-CM

## 2020-07-04 MED ORDER — CARBIDOPA-LEVODOPA 25-100 MG PO TABS: ORAL_TABLET | ORAL | 3 refills | Status: DC

## 2020-07-04 NOTE — Progress Notes (Signed)
Subjective:    Patient ID: William [name] is a 87 y.o. male.  HPI      Interim history:    William [name] is a 87 year old right-handed gentleman with an underlying medical history of right TIA in January 2003, SDH (s/p left craniotomy in August 2006), hypertension, hypothyroidism, thyroid cancer, partial onset seizures in the past (off Dilantin), lumbar spine disease, status post lower back surgery at L4-5 in January 2014, renal tumor, status post kidney tumor ablative surgery in March 2014, who presents for followup consultation of his right-sided predominant [name]. He is accompanied by his GF/partner, William [name] again today. I last saw him on 03/06/2020, at which time he reported feeling stable.  He had started Myrbetriq per urology which helped reduce his nighttime urination.  He did not have any recent falls.  He had recent Mohs procedures which limited his physical activity.  He had other procedures pending.  He was having reasonably good results with respect to constipation control with MiraLAX.  He was advised to continue with Azilect generic once daily, Sinemet IR and Sinemet CR on his prior regimen.  Today, 07/04/2020: He reports feeling a little better.  He had a fall about a week ago, he fell in the bathroom, felt briefly lightheaded and wanted to grab onto something, he reached for the shower door handle which is not a secure grab bar and in the process of leaning backwards against a wall to brace himself, he shattered the glass shower door.  He did not fall into the shower but ended up cutting himself and several smaller places with the broken glass.  He did not call immediately for help or the did not call 911.  His girlfriend reached out to their neighbor who came immediately and with, and help if they were able to stand back up, he did not lose consciousness or hit his head, he braced his fall by forcing himself to lean against the wall and sliding down, he ended up sitting  closer onto the pile up glass pebbles created by the shattered glass door of the shower.  He had blood quite significantly from a surgical side of his right lower face where he had Mohs surgery the day before.  He typically only holds his Eliquis before the surgery and takes the dose after the surgery, he has had a total of 4-6 additional Mohs surgeries since I saw him last in November.  He is scheduled to have at least 3 more surgeries.  He had at least 3 wounds that were bleeding and several surgical sites that took longer to heal.  He has not been able to exercise on a regular basis since he has had multiple surgeries through the dermatologist.  His girlfriend believes that he has become deconditioned and more frail.    The patient's allergies, current medications, family history, past medical history, past social history, past surgical history and problem list were reviewed and updated as appropriate.    Previously (copied from previous notes for reference):    I saw him on 11/04/2019, at which time he reported a recent fall.  He did not sustain any major injuries thankfully, but did scrape his knee.  He did get checked out at [facility] ER and had CT scans of his head and neck which did not show any acute injuries thankfully.  He was advised to use his cane consistently and also consider using a walker for outside use for better stability.  I suggested we  add Sinemet CR at bedtime to his regimen.       I saw him on 07/05/2019 , At which time he reported doing fairly well.  She had noticed more forgetfulness.  We continued his medications at the current dose and timings, with the exception of increasing the 4 PM dose to 2 pills instead of 1-1/2 pills, in the hopes that he would have slightly better symptom control when they would go to the dining hall in the evening for their dinner.     I saw him on 03/03/19, at which time he reported feeling stable, he had a recent swallow evaluation at home.  He  was taking Azilect once daily around lunchtime rather than first thing in the morning because of lower blood pressure values first thing in the morning.     I saw him on 11/04/18, at which time he reported having intermittent spells of feeling weaker.  He had some issues with his mobility.  They were settling in their condo, had moved in April 2020 to [place].  He requested physical, occupational and speech therapy through [facility] rehab and fitness at [facility].    In the interim, a swallow evaluation was recommended through his speech therapist.     I saw him on 05/07/2018, at which time he reported feeling fairly well and stable.  No recent falls reported.  I suggested we continue with Azilect once daily and Sinemet 1-1/2 pills 4 times a day, prescriptions typically are through the [place].     I saw him on 01/05/2018, at which time he reported a recent fall. He scraped his left elbow. He has had some right-sided knee pain and felt like his knee had given out. Otherwise, he was doing rather well. We mutually agreed to keep his medication regimen the same. She was advised to add some more strength training to his exercise regimen and stay well-hydrated.    I saw him on 09/02/2017, at which time he was quite stable motor-wise, he had some stressors including regarding William [name]' health and they were trying to consolidate their households with a plan to move into a retirement community condo by next year. He was advised to continue with generic Azilect once daily and Sinemet generic 1-1/2 pills 4 times a day. He was encouraged to hydrate better with water.   I saw him on 05/05/2017, at which time he reported doing quite well, no recent falls or recent illness. They were planning to move into a retirement community by the end of this year. He was trying to exercise regularly. Blood pressure was stable. He had seen pulmonology for a lung nodule which was stable. I suggested we continue with his current  medication regimen.   I saw him on 12/31/2016, at which time he reported intermittent lightheadedness. He had some blood pressure fluctuations with really high values at times. He was hospitalized in August 2018 for a non-STEMI. He had significant orthostatic hypotension during the office visit. He was advised to talk to his providers about reducing his blood pressure medication. He was advised to not continue with the boxing classes for [name] but exercise within his limitations at home. He was advised to try melatonin at night for sleep issues.    I saw him on 06/27/2016, at which time he reported doing well, he had a trip to [place] in [place] planned for springtime. He had no recent falls. He was supposed to be on a smaller dose of Eliquis, namely 2.5  mg twice a day. He continued to participate in the boxing class for [name] and felt that they were helpful.   I saw him on 03/05/2016, at which time he reported doing okay. He was during his stay active, was participating in [place] steady boxing, which he felt was helpful. He was avoiding strenuous parts of the exercise. He was interested in participating in a new drug study at [facility]. This was to investigate apomorphine sublingual to decrease off time. He was on Azilect once daily. He was taking Sinemet 4 times a day. He was trying to hydrate well, reported no recent falls. I suggested he increase the Sinemet to 1-1/2 pills 4 times a day. I suggested he continue with generic Azilect once daily.   I saw him on 10/26/2015, at which time he reported doing quite well, had some stumbling episodes, no actual falls, was trying to drink enough water and was still physically active. He had signed up for rock steady boxing classes but William [name] noted that he would get too exhausted.   I saw him on 06/22/2015, at which time he reported doing fairly well. He had finished outpatient OT, PT, and ST. Unfortunately, he did take a fall in the  garage couple of months prior as he was coming in from the graduate, stepped backwards on the stairs and slipped off falling backwards, landed on his behind, head struck the car, he denied loss of consciousness or headache or bruise and no sequelae were reported. He was having rails installed in the garage entrance. Was still trying to stay active, playing golf. We mutually agreed to continue with Sinemet 4 times a day and Azilect once daily. We talked about fall risk and gait safety and fall precautions.   I saw him on 02/23/2015, at which time he reported doing fairly well. He was playing golf regularly. His right knee was bothering him. He had no recent falls. He was trying to hydrate well enough. He had no new issues with A. fib and no new complaints otherwise. I noted right knee swelling. He was advised to follow-up with his primary care physician for this. We mutually agreed to keep his Parkinson's medications the same.    I saw him on 11/01/2014 at which time he reported a recent diagnosis with A. fib. He was admitted to the hospital in April. I reviewed the hospital records from 07/17/2014 through 07/18/2014. He was started on Xarelto. He was then re-admitted on 08/09/14 to 08/10/14 due to altered mental status and slurring of speech and was suspected to have a TIA. He was seen by neuro in consultation and a baby aspirin was added to Xarelto. He presented back to the ER on 08/18/14. He a [place] wo contrast on  08/18/2014 , which was negative for any acute changes. He was seen by the Neurologist in the emergency room and it was felt that a full TIA workup was not necessary at the time. He had presented with hypertension and some slurring of speech. He was switched from Xarelto to Eliquis, because the Xarelto is not on formulary at the [place].  He endorsed recent stressors, including the recent passing of his younger brother a week prior with advanced Alzheimer's disease.    He had an MRI brain and MRA head on  08/09/14: MRI HEAD: No acute intracranial process, specifically no acute ischemia. Involutional changes. Mild white matter changes suggest chronic small vessel ischemic disease. MRA HEAD: No acute large vessel occlusion or high-grade stenosis.  Mid grade stenosis RIGHT P2/3 segment.    I suggested we continue with Sinemet at 4 times a day. He was advised to continue with Azilect as well. He was advised to drink more water. In the interim, we restarted outpatient physical therapy, occupational therapy and speech therapy.   I saw him on 04/29/2014, at which time he reported doing well overall but he was more fatigued and had some excessive daytime somnolence. Memory was stable. His mood was stable. He noted some blood pressure fluctuations. Sometimes he had a dull headache and sometimes he felt lightheaded. He had some anxiety over his lady friend's health, as she had fallen and broken rib. He continued to walk regularly and played golf. He had no recent falls. I asked him to continue with his medications, Azilect once daily and Sinemet 4 times a day.   I saw him on 10/28/2013, at which time he reported doing well. In particular, had no cognitive complaints, no mood issues, and continued to play golf 3 times a week. He was driving well. I suggested an increase in his levodopa to 1 pill 4 times a day of the 25-100 milligrams strength. [facility] and had seen a [place] neurologist as well.    I saw him on 12/07/2012, at which time I felt that he was doing well on Azilect and levodopa. He called in December 2014 with problems with blood pressure fluctuations. He was wondering if this came from the [place] but I did not think it was due to the Azilect per se. I was reluctant to take him off of it.    I first met him on 06/03/2012 and he previously followed by Dr. Morene Antu. He has had primarily right-sided symptoms with regards to his Parkinson's, diagnosed in 2010 with symptoms  dating back to late 2009 or early 2010. He had briefly tried Mirapex but was taken off d/t hypotension. L spine MRI in June 2013 showed renal cysts, degenerative joint disease most prominent at L4-5. He tried acupuncture. His MRI of the lumbar spine showed abnormalities with his kidney with a cyst and a tumor and he had ablative surgery on 07/10/12 and had a FU CT done. He had lower back surgery on 05/14/12.  I saw him back on 09/03/2012 and I suggested starting Azilect. He has been tolerating it well and both he and his girlfriend felt that he did better with it in terms of dexterity and fine motor control. He had some hypotension and lightheadedness and reduced his BP medication. He has been having some issue with gout.   His Past Medical History Is Significant For: Past Medical History:  Diagnosis Date  . A-fib ([place])   . Arthritis    "right knee" (08/09/2014)  . Basal cell carcinoma   . Benign prostatic hypertrophy with urinary obstruction   . Chronic anticoagulation 12/04/2016  . Complication of anesthesia    "difficulty intubation, had to use fiberoptic 2006"  . Difficult intubation   . DJD (degenerative joint disease)   . Gout   . Hx of colonic polyps   . Hyperlipidemia   . Hypertension    sees [name]  . Hypothyroidism   . Kidney tumor    "tumor on cyst on kidney"   . 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 Mini stroke ([place])   . Parkinson disease ([place])   . Pneumonia    hx of in  1952  . Primary skin squamous cell carcinoma   . Subdural hematoma ([place]) 2006  . TIA (transient ischemic attack)    pt does not recall this hx "but may have had one today" (08/09/2014)    His Past Surgical History Is Significant For: Past Surgical History:  Procedure Laterality Date  . APPENDECTOMY    . BACK SURGERY    . CATARACT EXTRACTION W/ INTRAOCULAR LENS  IMPLANT, BILATERAL Bilateral ~ 1998  . CHOLECYSTECTOMY    . CRYOABLATION  07-10-12   "tumor on cyst on  my kidney; had an ablation"  . EYE SURGERY    . INGUINAL HERNIA REPAIR Right   . IR GENERIC HISTORICAL  11/09/2015   IR RADIOLOGIST EVAL & MGMT 11/09/2015 [name], MD [facility] INTERV RAD  . IR RADIOLOGIST EVAL & MGMT  08/13/2016  . KIDNEY SURGERY    . KNEE ARTHROSCOPY Right   . LEFT HEART CATH AND CORONARY ANGIOGRAPHY N/A 12/05/2016   Procedure: LEFT HEART CATH AND CORONARY ANGIOGRAPHY;  Surgeon: [name], [name], MD;  Location: [place] CV LAB;  Service: Cardiovascular;  Laterality: N/A;  . LUMBAR LAMINECTOMY/DECOMPRESSION MICRODISCECTOMY  05/14/2012   Procedure: LUMBAR LAMINECTOMY/DECOMPRESSION MICRODISCECTOMY 1 LEVEL;  Surgeon: [name], MD;  Location: [place] NEURO ORS;  Service: Neurosurgery;  Laterality: Left;  Left Lumbar four-five Laminectomy/Diskectomy/Far lateral diskectomy  . SKIN CANCER EXCISION  "several"   "back of my neck; right eye; clavicle; nose"  . SUBDURAL HEMATOMA EVACUATION VIA CRANIOTOMY  2006  . THYROIDECTOMY     medullary carcinoma  . TONSILLECTOMY      His Family History Is Significant For: Family History  Problem Relation Age of Onset  . Stroke Mother   . Stroke Father   . Heart disease Father   . Alzheimer's disease Brother   . Alzheimer's disease Sister     His Social History Is Significant For: Social History   Socioeconomic History  . Marital status: Widowed    Spouse name: Not on file  . Number of children: 3  . Years of education: 60  . Highest education level: Not on file  Occupational History  . Occupation: retired  Tobacco Use  . Smoking status: Former Smoker    Packs/day: 0.80    Years: 15.00    Pack years: 12.00    Types: Cigarettes    Quit date: 04/15/1965    Years since quitting: 55.2  . Smokeless tobacco: Never Used  Vaping Use  . Vaping Use: Never used  Substance and Sexual Activity  . Alcohol use: Yes    Alcohol/week: 5.0 standard drinks    Types: 5 Glasses of wine per week    Comment: 1 glass of wine every night (6oz  glass)  . Drug use: No  . Sexual activity: Yes  Other Topics Concern  . Not on file  Social History Narrative   Married.   Lives at home with significant other.   Retried. 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   Enjoys golfing, yard work, spending time with family.     Social Determinants of Health   Financial Resource Strain: Not on file  Food Insecurity: Not on file  Transportation Needs: Not on file  Physical Activity: Not on file  Stress: Not on file  Social Connections: Not on file    His Allergies Are:  No Known Allergies:   His Current Medications Are:  Outpatient Encounter Medications as of 07/04/2020  Medication Sig  . amLODipine (NORVASC) 2.5 MG  tablet TAKE 1 TABLET DAILY TO TAKEWITH AMLODIPINE 5MG FOR    TOTAL OF 7.5MG DAILY  . amLODipine (NORVASC) 5 MG tablet TAKE 1 TABLET DAILY TO TAKEWITH 2.5MG TO MAKE 7.5MG   DAILY  . apixaban (ELIQUIS) 2.5 MG TABS tablet Take 1 tablet (2.5 mg total) by mouth 2 (two) times daily.  . carbidopa-levodopa (SINEMET CR) 50-200 MG tablet Take 1 tablet by mouth at bedtime.  . carbidopa-levodopa (SINEMET IR) 25-100 MG tablet Take 1.5 tablets three times a day at 8am, 12, and 8 PM. Take 2 tablets at 4pm.  . Cyanocobalamin (VITAMIN B-12 IJ) Inject as directed.  . hydrALAZINE (APRESOLINE) 10 MG tablet Take 10 mg by mouth as directed. Take 1 tablet by mouth up to three times daily for systolic blood pressure readings above 170.  . levothyroxine (SYNTHROID) 100 MCG tablet TAKE 1 TABLET DAILY ON AN  EMPTY STOMACH WITH A FULL  GLASS OF WATER  . losartan (COZAAR) 100 MG tablet TAKE 1 TABLET DAILY AT 2PM  . Mirabegron (MYRBETRIQ PO) Take by mouth daily.  . nitroGLYCERIN (NITROSTAT) 0.4 MG SL tablet Place 1 tablet (0.4 mg total) under the tongue every 5 (five) minutes x 3 doses as needed for chest pain.  . Probiotic Product (ALIGN) 4 MG CAPS Take 1 capsule by mouth daily.  . rasagiline (AZILECT) 1 MG TABS tablet Take 1 tablet (1 mg total) by mouth  daily.  . simvastatin (ZOCOR) 40 MG tablet TAKE 1 TABLET AT BEDTIME   No facility-administered encounter medications on file as of 07/04/2020.  :  Review of Systems:  Out of a complete 14 point review of systems, all are reviewed and negative with the exception of these symptoms as listed below: Review of Systems  Neurological:       Here for f/u on PD. Pt reports a bad fall that took placed 1 week ago. He sts he was in the bathroom when he fell backwards against the wall and ended up going through the shower door. The right side took the brunt of the fall. Pt reports he was not taken to the ED or checkout by [employer] physician. Pt sts the cuts he sustained were not deep and did not need stiches. Pt reports this is the only fall that took place since his last visit.  Reports tremors are worse since his last visit as well.      Objective:  Neurological Exam  Physical Exam Physical Examination:   Vitals:   07/04/20 1104  BP: 132/78  Pulse: (!) 51    General Examination: The patient is a very pleasant 87 y.o. male in no acute distress. He appears more frail and deconditioned.  HEENT:Normocephalic, atraumatic, but with swelling and healing wound right lower face from a recent surgery last week.  He also has postsurgical changes to his left ear. Pupils are equal, round and reactive to light, extraocular trackingshows moderate saccadic breakdown, moderate speech difficulty noted including moderate dysarthria and hypophonia.  Hearing is mildly impaired, airway examination shows stable findings. Moderateto severenuchal rigidity and decrease in passive range of motion.Multiple sites of prior skin surgeries, scarring behind the left ear and by the left lateral neck and below the left clavicle.  Chest:Clear to auscultation without wheezing, rhonchi or crackles noted.  Heart:S1+S2+0, regular and normal without murmurs, rubs or gallops noted.   Abdomen:Soft, non-tender and  non-distended.  Extremities:The left leg has a smaller bandaged area on the shin.  There is a healing wound underneath.  Chronic mild bruising noted throughout upper and lower extremities.   Skin: Warm and dry without trophic changes noted. Mild bruising on hands, stable, chronic.  Also recent healed cut right lateral palm.  Musculoskeletal: exam reveals no obvious joint deformities, tenderness or joint swelling orredness.   Neurologically:  Mental status: The patient is awake, alert and oriented in all 4 spheres. Hisimmediate and remote memory, attention, language skills and fund of knowledge are appropriate. There is no evidence of aphasia, agnosia, apraxia or anomia. Thought process is linear. Mood is normaland affect is normal.  Cranial nerves II - XII are as described above under HEENT exam.  Motor exam:thin muscle bulk globally,average strength is about 4/5.He has increased tone in the right more than left upper and lower extremities. He has a resting tremor in both upper extremities, R>L, more on the moderate side today.  Fine motor skills are moderately impaired on the right andslightly better on theleft. He has mild intermittent dyskinesias, mostly on the right side and some cocontraction.He stands up with mild difficulty andpushes himself up. Posture ismore stooped today.  He walks with decreased stride length and decrease pace,decreased arm swing bilaterally,using a cane today, on the right.   Slight insecurity with turns but needs no assistance today.  Sensory exam is intact to light touch. Cerebellar testing shows no additional concern for dysmetria or intention tremor or gait ataxia.  Assessment and Plan:   In summary, William Dubin Broughtonis a very pleasant 88 year old malewithan underlying medical history of right TIA in January 2003, SDH (s/p left craniotomy in August 2006), hypertension, hypothyroidism, thyroid cancer, partial onset seizures (off Dilantin),  lumbar spine disease, status-post lower back surgery at L4-5 in January 2014, renal tumor, status post kidney tumor ablative surgery in March 2014, who presents for followup consultation of hisadvancedright-sided predominant [name],complicated bysome autonomic dysregulation, history of falls, and overall muscular deconditioning and balance issues, sleep disturbance, mild memory issues and chronic constipation.  He has had blood pressure fluctuations.  He recently had multiple Mohs surgeries for skin cancers in the past several months.  He had a recent fall last week, the day after he had surgery and had some residual bleeding in the evening.  He is supposed to only hold his Eliquis morning dose and take it after the surgery.  They are advised to double check with the cardiologist regarding his Eliquis.  Furthermore, they also are advised to discuss further Mohs surgery with a dermatologist.  Unfortunately, he has not been able to exercise because he has had multiple surgeries and at times difficulty with wound healing. He is advised that he should never take a fall lightly.  He is strongly advised to seek immediate medical attention should he have a fall is serious is the 1 last week.  He is at high risk for injuries given his advanced age, advanced [name], prior history of subdural hematoma, being on a blood thinner. He continues to take Azilect 1 mg once daily and Sinemet generic 1-1/2 pills 3 times a day and 2 pills at 4 PM.  We started Sinemet CR at bedtime in July 2021 and he noticed improvement in his mobility at night.  Also, adding Myrbetriq through urology has helped his nighttime urination.  He has an appointment with his urologist, he is wondering if he can increase the Myrbetriq.  Constipation is under control with daily probiotic and nearly daily MiraLAX.  He is advised to use his cane at all times, continue with  his current medication regimen and try to hydrate well, try  to increase his walking exercises as allowable through dermatology.   He willfollow-up routinely in this clinic in 4 months, sooner if needed. I answered all their questions today and the patient and William [name] were in agreement. I spent 30 minutes in total face-to-face time and in reviewing records during pre-charting, more than 50% of which was spent in counseling and coordination of care, reviewing test results, reviewing medications and treatment regimen and/or in discussing or reviewing the diagnosis of PD, the prognosis and treatment options. Pertinent laboratory and imaging test results that were available during this visit with the patient were reviewed by me and considered in my medical decision making (see chart for details).

## 2020-07-04 NOTE — Patient Instructions (Addendum)
I am glad to hear that you are doing a little better.  However, if you ever fall, you are at high risk for injuries because of multiple issues including advanced age, [name], having had a subdural hematoma before, being on a blood thinner, having had deconditioning.  You need to call immediately for help and need to get checked out immediately in an emergency room setting or at least by EMS.  Please talk to your cardiologist about holding your Eliquis or skipping at least 1 dose when you have scheduled Mohs surgeries.  It sounds like you had an extensive bleed after your last week's surgery and this may have contributed to your lightheadedness, likely low blood pressure values and fall in the bathroom.  Because of your [name] you are already at risk for low blood pressure and blood pressure fluctuations.  Please also discuss with your dermatologist these recent issues when you go next week for checkup and when you plan your next Mohs surgery.  We will maintain your current medications for your [name] and follow-up routinely in 3 to 4 months in this clinic.

## 2020-07-13 ENCOUNTER — Ambulatory Visit: Payer: Medicare HMO | Admitting: Cardiology

## 2020-07-19 NOTE — Progress Notes (Signed)
Cardiology Office Note   Date:  07/20/2020   ID:  William [name], DOB 1928/08/28, MRN [id number]  PCP:  [name], [name]  Cardiologist:   Minus Breeding, MD   Chief Complaint  Patient presents with  . Atrial Fibrillation      History of Present Illness: William [name] is a 87 y.o. male who presents for evaluation of CAD.   Cardiac catheterization demonstrated 99% septal perforator and 50% LAD stenosis.  His ejection fraction was well preserved.  He did have an elevated EDP.  He was managed medically.    Since I last saw him he has had multiple Mohs surgeries.  The patient denies any new symptoms such as chest discomfort, neck or arm discomfort. There has been no new shortness of breath, PND or orthopnea. There have been no reported palpitations, presyncope or syncope.      Past Medical History:  Diagnosis Date  . A-fib ([place])   . Arthritis    "right knee" (08/09/2014)  . Basal cell carcinoma   . Benign prostatic hypertrophy with urinary obstruction   . Chronic anticoagulation 12/04/2016  . Complication of anesthesia    "difficulty intubation, had to use fiberoptic 2006"  . Difficult intubation   . DJD (degenerative joint disease)   . Gout   . Hx of colonic polyps   . Hyperlipidemia   . Hypertension    sees [name]  . Hypothyroidism   . Kidney tumor    "tumor on cyst on kidney"   . 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 Mini stroke ([place])   . Parkinson disease ([place])   . Pneumonia    hx of in 1952  . Primary skin squamous cell carcinoma   . Subdural hematoma ([place]) 2006  . TIA (transient ischemic attack)    pt does not recall this hx "but may have had one today" (08/09/2014)    Past Surgical History:  Procedure Laterality Date  . APPENDECTOMY    . BACK SURGERY    . CATARACT EXTRACTION W/ INTRAOCULAR LENS  IMPLANT, BILATERAL Bilateral ~ 1998  . CHOLECYSTECTOMY    . CRYOABLATION  07-10-12   "tumor on cyst on  my kidney; had an ablation"  . EYE SURGERY    . INGUINAL HERNIA REPAIR Right   . IR GENERIC HISTORICAL  11/09/2015   IR RADIOLOGIST EVAL & MGMT 11/09/2015 [name], MD [facility] INTERV RAD  . IR RADIOLOGIST EVAL & MGMT  08/13/2016  . KIDNEY SURGERY    . KNEE ARTHROSCOPY Right   . LEFT HEART CATH AND CORONARY ANGIOGRAPHY N/A 12/05/2016   Procedure: LEFT HEART CATH AND CORONARY ANGIOGRAPHY;  Surgeon: [name], [name], MD;  Location: [facility];  Service: Cardiovascular;  Laterality: N/A;  . LUMBAR LAMINECTOMY/DECOMPRESSION MICRODISCECTOMY  05/14/2012   Procedure: LUMBAR LAMINECTOMY/DECOMPRESSION MICRODISCECTOMY 1 LEVEL;  Surgeon: [name], MD;  Location: [facility];  Service: Neurosurgery;  Laterality: Left;  Left Lumbar four-five Laminectomy/Diskectomy/Far lateral diskectomy  . SKIN CANCER EXCISION  "several"   "back of my neck; right eye; clavicle; nose"  . SUBDURAL HEMATOMA EVACUATION VIA CRANIOTOMY  2006  . THYROIDECTOMY     medullary carcinoma  . TONSILLECTOMY       Current Outpatient Medications  Medication Sig Dispense Refill  . amLODipine (NORVASC) 2.5 MG tablet TAKE 1 TABLET DAILY TO TAKEWITH AMLODIPINE 5MG  FOR    TOTAL OF 7.5MG  DAILY 90 tablet  2  . amLODipine (NORVASC) 5 MG tablet TAKE 1 TABLET DAILY TO TAKEWITH 2.5MG  TO MAKE 7.5MG    DAILY 90 tablet 2  . apixaban (ELIQUIS) 2.5 MG TABS tablet Take 1 tablet (2.5 mg total) by mouth 2 (two) times daily. 60 tablet 4  . carbidopa-levodopa (SINEMET CR) 50-200 MG tablet Take 1 tablet by mouth at bedtime. 90 tablet 3  . carbidopa-levodopa (SINEMET IR) 25-100 MG tablet Take 1.5 tablets three times a day at 8am, 12, and 8 PM. Take 2 tablets at 4pm. 585 tablet 3  . Cyanocobalamin (VITAMIN B-12 IJ) Inject as directed.    . hydrALAZINE (APRESOLINE) 10 MG tablet Take 10 mg by mouth as directed. Take 1 tablet by mouth up to three times daily for systolic blood pressure readings above 170.    . levothyroxine (SYNTHROID) 100 MCG  tablet TAKE 1 TABLET DAILY ON AN  EMPTY STOMACH WITH A FULL  GLASS OF WATER 90 tablet 1  . losartan (COZAAR) 100 MG tablet TAKE 1 TABLET DAILY AT 2PM 90 tablet 3  . mirabegron ER (MYRBETRIQ) 50 MG TB24 tablet Take 1 tablet by mouth daily.    . nitroGLYCERIN (NITROSTAT) 0.4 MG SL tablet Place 1 tablet (0.4 mg total) under the tongue every 5 (five) minutes x 3 doses as needed for chest pain. 25 tablet 0  . Probiotic Product (ALIGN) 4 MG CAPS Take 1 capsule by mouth daily.    . rasagiline (AZILECT) 1 MG TABS tablet Take 1 tablet (1 mg total) by mouth daily. 90 tablet 3  . simvastatin (ZOCOR) 40 MG tablet TAKE 1 TABLET AT BEDTIME 90 tablet 1   No current facility-administered medications for this visit.    Allergies:   Patient has no known allergies.    ROS:  Please see the history of present illness.   Otherwise, review of systems are positive for none.   All other systems are reviewed and negative.    PHYSICAL EXAM: VS:  BP (!) 148/82 (BP Location: Left Arm, Patient Position: Sitting, Cuff Size: Normal)   Pulse 60   Ht 6\' 3"  (1.905 m)   Wt 183 lb 9.6 oz (83.3 kg)   BMI 22.95 kg/m  , BMI Body mass index is 22.95 kg/m. GENERAL:  Well appearing NECK:  No jugular venous distention, waveform within normal limits, carotid upstroke brisk and symmetric, no bruits, no thyromegaly LUNGS:  Clear to auscultation bilaterally CHEST:  Unremarkable HEART:  PMI not displaced or sustained,S1 and S2 within normal limits, no S3, no S4, no clicks, no rubs, no murmurs ABD:  Flat, positive bowel sounds normal in frequency in pitch, no bruits, no rebound, no guarding, no midline pulsatile mass, no hepatomegaly, no splenomegaly EXT:  2 plus pulses throughout, no edema, no cyanosis no clubbing  EKG:  EKG is ordered today. The ekg ordered today demonstrates sinus rhythm, rate 60, right bundle branch block, no acute ST-T wave changes.   Recent Labs: No results found for requested labs within last 8760 hours.     Lipid Panel    Component Value Date/Time   CHOL 124 01/19/2019 0957   TRIG 67.0 01/19/2019 0957   TRIG 101 03/25/2006 0935   HDL 49.00 01/19/2019 0957   CHOLHDL 3 01/19/2019 0957   VLDL 13.4 01/19/2019 0957   LDLCALC 62 01/19/2019 0957      Wt Readings from Last 3 Encounters:  07/20/20 183 lb 9.6 oz (83.3 kg)  07/04/20 184 lb (83.5 kg)  03/06/20 183 lb (83  kg)      Other studies Reviewed: Additional studies/ records that were reviewed today include: None. Review of the above records demonstrates:  Please see elsewhere in the note.     ASSESSMENT AND PLAN:  ATRIAL FIB: The patient has a CHA2DS2 - VASc score of 5.  He has no symptomatic paroxysms.  He is not having problem with anticoagulation.  No change in therapy.   THORACIC AORTIC ENLARGEMENT: This was 4.4 cm on CT in Dec 2018.     We decided to manage this conservatively.  No further therapy.  No further imaging.   HTN: The blood pressure is  nicely controlled after working with our pharmacist for several months.  They were very complementary of our pharmacy service.   CAD: The patient has no new sypmtoms.  No further cardiovascular testing is indicated.  We will continue with aggressive risk reduction and meds as listed.  CHRONIC DIASTOLIC HF:  Seems to be euvolemic.  No change in therapy.  CKD II: Creatininewas 1.45.  This is stable.  No change in therapy.    Current medicines are reviewed at length with the patient today.  The patient does not have concerns regarding medicines.  The following changes have been made:  no change  Labs/ tests ordered today include: None  Orders Placed This Encounter  Procedures  . EKG 12-Lead     Disposition:   FU with me in one year.     Signed, Minus Breeding, MD  07/20/2020 12:42 PM    [facility]

## 2020-07-20 ENCOUNTER — Encounter: Payer: Self-pay | Admitting: Cardiology

## 2020-07-20 ENCOUNTER — Ambulatory Visit: Payer: Medicare HMO | Admitting: Cardiology

## 2020-07-20 ENCOUNTER — Other Ambulatory Visit: Payer: Self-pay

## 2020-07-20 VITALS — BP 148/82 | HR 60 | Ht 75.0 in | Wt 183.6 lb

## 2020-07-20 DIAGNOSIS — I712 Thoracic aortic aneurysm, without rupture, unspecified: Secondary | ICD-10-CM

## 2020-07-20 DIAGNOSIS — I480 Paroxysmal atrial fibrillation: Secondary | ICD-10-CM | POA: Diagnosis not present

## 2020-07-20 DIAGNOSIS — I5032 Chronic diastolic (congestive) heart failure: Secondary | ICD-10-CM | POA: Diagnosis not present

## 2020-07-20 DIAGNOSIS — I10 Essential (primary) hypertension: Secondary | ICD-10-CM

## 2020-07-20 NOTE — Patient Instructions (Signed)
Medication Instructions:  Continue current medications  *If you need a refill on your cardiac medications before your next appointment, please call your pharmacy*   Lab Work: None Ordered   Testing/Procedures: None Ordered   Follow-Up: At [facility], you and your health needs are our priority.  As part of our continuing mission to provide you with exceptional heart care, we have created designated Provider Care Teams.  These Care Teams include your primary Cardiologist (physician) and Advanced Practice Providers (APPs -  Physician Assistants and Nurse Practitioners) who all work together to provide you with the care you need, when you need it.  We recommend signing up for the patient portal called "MyChart".  Sign up information is provided on this After Visit Summary.  MyChart is used to connect with patients for Virtual Visits (Telemedicine).  Patients are able to view lab/test results, encounter notes, upcoming appointments, etc.  Non-urgent messages can be sent to your provider as well.   To learn more about what you can do with MyChart, go to [email or web address].    Your next appointment:   1 year(s)  The format for your next appointment:   In Person  Provider:   You may see James Hochrein, MD or one of the following Advanced Practice Providers on your designated Care Team:    Rhonda Barrett, PA-C  Kathryn Lawrence, DNP, ANP     

## 2020-08-30 ENCOUNTER — Emergency Department (HOSPITAL_COMMUNITY): Payer: Medicare HMO

## 2020-08-30 ENCOUNTER — Other Ambulatory Visit: Payer: Self-pay

## 2020-08-30 ENCOUNTER — Encounter (HOSPITAL_COMMUNITY): Payer: Self-pay | Admitting: *Deleted

## 2020-08-30 ENCOUNTER — Emergency Department (HOSPITAL_COMMUNITY)
Admission: EM | Admit: 2020-08-30 | Discharge: 2020-08-30 | Disposition: A | Discharge status: Home / Self Care | Payer: Medicare HMO | Attending: Emergency Medicine | Admitting: Emergency Medicine

## 2020-08-30 DIAGNOSIS — I130 Hypertensive heart and chronic kidney disease with heart failure and stage 1 through stage 4 chronic kidney disease, or unspecified chronic kidney disease: Secondary | ICD-10-CM | POA: Diagnosis not present

## 2020-08-30 DIAGNOSIS — I2510 Atherosclerotic heart disease of native coronary artery without angina pectoris: Secondary | ICD-10-CM | POA: Diagnosis not present

## 2020-08-30 DIAGNOSIS — E039 Hypothyroidism, unspecified: Secondary | ICD-10-CM | POA: Diagnosis not present

## 2020-08-30 DIAGNOSIS — Z79899 Other long term (current) drug therapy: Secondary | ICD-10-CM | POA: Diagnosis not present

## 2020-08-30 DIAGNOSIS — G20 Parkinson's disease: Secondary | ICD-10-CM | POA: Insufficient documentation

## 2020-08-30 DIAGNOSIS — S0083XA Contusion of other part of head, initial encounter: Secondary | ICD-10-CM | POA: Diagnosis not present

## 2020-08-30 DIAGNOSIS — N182 Chronic kidney disease, stage 2 (mild): Secondary | ICD-10-CM | POA: Diagnosis not present

## 2020-08-30 DIAGNOSIS — W01198A Fall on same level from slipping, tripping and stumbling with subsequent striking against other object, initial encounter: Secondary | ICD-10-CM | POA: Insufficient documentation

## 2020-08-30 DIAGNOSIS — Z87891 Personal history of nicotine dependence: Secondary | ICD-10-CM | POA: Insufficient documentation

## 2020-08-30 DIAGNOSIS — Z85850 Personal history of malignant neoplasm of thyroid: Secondary | ICD-10-CM | POA: Insufficient documentation

## 2020-08-30 DIAGNOSIS — S0990XA Unspecified injury of head, initial encounter: Secondary | ICD-10-CM | POA: Diagnosis present

## 2020-08-30 DIAGNOSIS — I509 Heart failure, unspecified: Secondary | ICD-10-CM | POA: Diagnosis not present

## 2020-08-30 DIAGNOSIS — Z7901 Long term (current) use of anticoagulants: Secondary | ICD-10-CM | POA: Diagnosis not present

## 2020-08-30 DIAGNOSIS — Z85828 Personal history of other malignant neoplasm of skin: Secondary | ICD-10-CM | POA: Diagnosis not present

## 2020-08-30 NOTE — ED Notes (Addendum)
TRN Breken Nazari and Santiago Glad at bedside, arrival 1622. Pt arrives after a fall with hematoma to forehead and abrasion to right knee. EDP Hong at bedside, states no trauma labs/IV access at this time. Plan for XRAY/CT only at this time. Assisted with CT, no consults at this time.

## 2020-08-30 NOTE — ED Provider Notes (Signed)
Emergency Medicine Provider Triage Evaluation Note  CURREN MOHRMANN , a 87 y.o. male  was evaluated in triage.  Pt complains of fall, on Eliquis. Was getting up off the couch to answer the phone but "started to move before my feet were ready." Reports prior brain bleed 25 years ago, has frequent falls. Incident occurred about 30 min PTA. Reports pain in the head and right knee. Hit head on ground.   Review of Systems  Positive: Headache, knee pain Negative: LOC  Physical Exam  There were no vitals taken for this visit. Gen:   Awake, no distress   Resp:  Normal effort  MSK:   Moves extremities without difficulty  Other:  Contusion to right forehead, abrasion to right knee  Medical Decision Making  Medically screening exam initiated at 4:19 PM.  Appropriate orders placed.  HANAD LEINO was informed that the remainder of the evaluation will be completed by another provider, this initial triage assessment does not replace that evaluation, and the importance of remaining in the ED until their evaluation is complete.  Level 2 trauma activated for fall on Eliquis.    Tacy Learn, PA-C 08/30/20 1619    Quintella Reichert, MD 08/31/20 [phone]

## 2020-08-30 NOTE — ED Triage Notes (Signed)
Pt reports getting up from the couch and tripping. Pt landed on his knee and then hit head on floor. Has hematoma to right forehead. Pt is on eliquis.

## 2020-08-30 NOTE — ED Notes (Signed)
Patient Alert and oriented to baseline. Stable and ambulatory to baseline. Patient verbalized understanding of the discharge instructions.  Patient belongings were taken by the patient.   

## 2020-08-30 NOTE — ED Provider Notes (Signed)
[facility] EMERGENCY DEPARTMENT Provider Note   CSN: [id number] Arrival date & time: 08/30/20  1608     History Chief Complaint  Patient presents with  . Fall    William [name] is a 87 y.o. male.  Pt complains of fall, on Eliquis. Was getting up off the couch to answer the phone but "started to move before my feet were ready." Reports prior brain bleed 25 years ago, has frequent falls. Incident occurred about 30 min PTA. Reports pain in the head and right knee. Hit head on ground.         Past Medical History:  Diagnosis Date  . A-fib ([place])   . Arthritis    "right knee" (08/09/2014)  . Basal cell carcinoma   . Benign prostatic hypertrophy with urinary obstruction   . Chronic anticoagulation 12/04/2016  . Complication of anesthesia    "difficulty intubation, had to use fiberoptic 2006"  . Difficult intubation   . DJD (degenerative joint disease)   . Gout   . Hx of colonic polyps   . Hyperlipidemia   . Hypertension    sees [name]  . Hypothyroidism   . Kidney tumor    "tumor on cyst on kidney"   . 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 Mini stroke ([place])   . Parkinson disease ([place])   . Pneumonia    hx of in 1952  . Primary skin squamous cell carcinoma   . Subdural hematoma ([place]) 2006  . TIA (transient ischemic attack)    pt does not recall this hx "but may have had one today" (08/09/2014)    Patient Active Problem List   Diagnosis Date Noted  . Chronic congestive heart failure with left ventricular diastolic dysfunction ([employer]) 04/01/2019  . Educated about COVID-19 virus infection 04/01/2019  . CKD (chronic kidney disease), stage II 02/23/2018  . Preventative health care 01/14/2018  . Thoracic aortic aneurysm without rupture ([place]) 03/14/2017  . Dizziness 03/14/2017  . Primary skin squamous cell carcinoma   . Parkinson disease ([place])   . Kidney tumor   . Hx of colonic polyps   . Gout   . DJD  (degenerative joint disease)   . Complication of anesthesia   . Basal cell carcinoma   . Diastolic CHF ([place]) 41/93/7902  . CAD (coronary artery disease), native coronary artery 12/12/2016  . Chronic anticoagulation 12/04/2016  . NSTEMI (non-ST elevated myocardial infarction) ([place]) 12/03/2016  . Long term current use of anticoagulant therapy 12/01/2015  . Abnormal CT of the chest 11/29/2015  . Medicare annual wellness visit, subsequent 06/15/2015  . Effusion of right knee 02/23/2015  . HLD (hyperlipidemia)   . TIA (transient ischemic attack) 08/09/2014  . CRI (chronic renal insufficiency), stage 3 (moderate) ([place]) 08/09/2014  . PAF (paroxysmal atrial fibrillation) ([place]) 08/09/2014  . Right renal mass   . Diverticulosis of colon without hemorrhage 06/16/2013  . Post-operative hypothyroidism 06/16/2013  . Renal oncocytoma 06/09/2012  . Parkinson disease, symptomatic ([place]) 06/06/2011  . CARCINOMA, SKIN, SQUAMOUS CELL 12/10/2007  . MALIGNANT NEOPLASM OF THYROID GLAND 12/10/2007  . Benign prostatic hyperplasia 12/10/2007  . Essential hypertension 04/22/2007  . Generalized atherosclerosis 04/22/2007  . Osteoarthritis 04/22/2007  . Subdural hematoma ([place]) 04/15/2004    Past Surgical History:  Procedure Laterality Date  . APPENDECTOMY    . BACK SURGERY    . CATARACT EXTRACTION W/ INTRAOCULAR LENS  IMPLANT, BILATERAL Bilateral ~ 1998  . CHOLECYSTECTOMY    .  CRYOABLATION  07-10-12   "tumor on cyst on my kidney; had an ablation"  . EYE SURGERY    . INGUINAL HERNIA REPAIR Right   . IR GENERIC HISTORICAL  11/09/2015   IR RADIOLOGIST EVAL & MGMT 11/09/2015 [name], MD [facility] INTERV RAD  . IR RADIOLOGIST EVAL & MGMT  08/13/2016  . KIDNEY SURGERY    . KNEE ARTHROSCOPY Right   . LEFT HEART CATH AND CORONARY ANGIOGRAPHY N/A 12/05/2016   Procedure: LEFT HEART CATH AND CORONARY ANGIOGRAPHY;  Surgeon: [name], [name], MD;  Location: [facility];  Service: Cardiovascular;  Laterality:  N/A;  . LUMBAR LAMINECTOMY/DECOMPRESSION MICRODISCECTOMY  05/14/2012   Procedure: LUMBAR LAMINECTOMY/DECOMPRESSION MICRODISCECTOMY 1 LEVEL;  Surgeon: [name], MD;  Location: [facility];  Service: Neurosurgery;  Laterality: Left;  Left Lumbar four-five Laminectomy/Diskectomy/Far lateral diskectomy  . SKIN CANCER EXCISION  "several"   "back of my neck; right eye; clavicle; nose"  . SUBDURAL HEMATOMA EVACUATION VIA CRANIOTOMY  2006  . THYROIDECTOMY     medullary carcinoma  . TONSILLECTOMY         Family History  Problem Relation Age of Onset  . Stroke Mother   . Stroke Father   . Heart disease Father   . Alzheimer's disease Brother   . Alzheimer's disease Sister     Social History   Tobacco Use  . Smoking status: Former Smoker    Packs/day: 0.80    Years: 15.00    Pack years: 12.00    Types: Cigarettes    Quit date: 04/15/1965    Years since quitting: 55.4  . Smokeless tobacco: Never Used  Vaping Use  . Vaping Use: Never used  Substance Use Topics  . Alcohol use: Yes    Alcohol/week: 5.0 standard drinks    Types: 5 Glasses of wine per week    Comment: 1 glass of wine every night (6oz glass)  . Drug use: No    Home Medications Prior to Admission medications   Medication Sig Start Date End Date Taking? Authorizing Provider  amLODipine (NORVASC) 2.5 MG tablet TAKE 1 TABLET DAILY TO TAKEWITH AMLODIPINE 5MG  FOR    TOTAL OF 7.5MG  DAILY 06/09/18   Minus Breeding, MD  amLODipine (NORVASC) 5 MG tablet TAKE 1 TABLET DAILY TO TAKEWITH 2.5MG  TO MAKE 7.5MG    DAILY 03/29/19   Minus Breeding, MD  apixaban (ELIQUIS) 2.5 MG TABS tablet Take 1 tablet (2.5 mg total) by mouth 2 (two) times daily. 05/15/17   Minus Breeding, MD  carbidopa-levodopa (SINEMET CR) 50-200 MG tablet Take 1 tablet by mouth at bedtime. 11/04/19   Star Age, MD  carbidopa-levodopa (SINEMET IR) 25-100 MG tablet Take 1.5 tablets three times a day at 8am, 12, and 8 PM. Take 2 tablets at 4pm. 07/04/20   Star Age, MD  Cyanocobalamin (VITAMIN B-12 IJ) Inject as directed.    [provider]  hydrALAZINE (APRESOLINE) 10 MG tablet Take 10 mg by mouth as directed. Take 1 tablet by mouth up to three times daily for systolic blood pressure readings above 170.    [provider]  levothyroxine (SYNTHROID) 100 MCG tablet TAKE 1 TABLET DAILY ON AN  EMPTY STOMACH WITH A FULL  GLASS OF WATER 09/30/18   Pleas Koch, NP  losartan (COZAAR) 100 MG tablet TAKE 1 TABLET DAILY AT 2PM 11/18/19   Minus Breeding, MD  mirabegron ER (MYRBETRIQ) 50 MG TB24 tablet Take 1 tablet by mouth daily. 07/18/20   [provider]  nitroGLYCERIN (NITROSTAT) 0.4 MG SL tablet Place 1 tablet (0.4 mg total) under the tongue every 5 (five) minutes x 3 doses as needed for chest pain. 01/19/19   Pleas Koch, NP  Probiotic Product (ALIGN) 4 MG CAPS Take 1 capsule by mouth daily.    [provider]  rasagiline (AZILECT) 1 MG TABS tablet Take 1 tablet (1 mg total) by mouth daily. 03/06/20   Star Age, MD  simvastatin (ZOCOR) 40 MG tablet TAKE 1 TABLET AT BEDTIME 09/14/18   Minus Breeding, MD    Allergies    Patient has no known allergies.  Review of Systems   Review of Systems  Constitutional: Negative for fever.  HENT: Negative for ear pain and sore throat.   Eyes: Negative for pain.  Respiratory: Negative for cough.   Cardiovascular: Negative for chest pain.  Gastrointestinal: Negative for abdominal pain.  Genitourinary: Negative for flank pain.  Musculoskeletal: Negative for back pain.  Skin: Negative for color change and rash.  Neurological: Negative for syncope.  All other systems reviewed and are negative.   Physical Exam Updated Vital Signs BP (!) 195/107   Pulse 68   Temp 98 F (36.7 C) (Oral)   Resp 14   Ht 6\' 3"  (1.905 m)   Wt 82.6 kg   SpO2 96%   BMI 22.75 kg/m   Physical Exam Constitutional:      General: He is not in acute distress.    Appearance: He is  well-developed.  HENT:     Head: Normocephalic.     Comments: Right frontal temporal contusion noted mild tenderness present.  No laceration noted.    Nose: Nose normal.  Eyes:     Extraocular Movements: Extraocular movements intact.  Cardiovascular:     Rate and Rhythm: Normal rate.  Pulmonary:     Effort: Pulmonary effort is normal.  Musculoskeletal:        General: No swelling or deformity. Normal range of motion.     Comments: No C or T or L-spine Step-offs or tenderness noted.  Skin:    Coloration: Skin is not jaundiced.  Neurological:     Mental Status: He is alert. Mental status is at baseline.     ED Results / Procedures / Treatments   Labs (all labs ordered are listed, but only abnormal results are displayed) Labs Reviewed - No data to display  EKG None  Radiology DG Knee 2 Views Right  Result Date: 08/30/2020 CLINICAL DATA:  Right knee pain after fall. EXAM: RIGHT KNEE - 1-2 VIEW COMPARISON:  None. FINDINGS: No evidence of fracture, dislocation, or joint effusion. Moderate narrowing of the medial and lateral joint spaces are noted. Soft tissues are unremarkable. IMPRESSION: Moderate degenerative joint disease.  No acute abnormality seen. Electronically Signed   By: Marijo Conception M.D.   On: 08/30/2020 16:52   CT Head Wo Contrast  Result Date: 08/30/2020 CLINICAL DATA:  Recent fall with headaches and neck pain, initial encounter EXAM: CT HEAD WITHOUT CONTRAST CT CERVICAL SPINE WITHOUT CONTRAST TECHNIQUE: Multidetector CT imaging of the head and cervical spine was performed following the standard protocol without intravenous contrast. Multiplanar CT image reconstructions of the cervical spine were also generated. COMPARISON:  08/18/2014 FINDINGS: CT HEAD FINDINGS Brain: No evidence of acute infarction, hemorrhage, hydrocephalus, extra-axial collection or mass lesion/mass effect. Mild atrophic changes and chronic white matter ischemic changes are noted. Vascular: No  hyperdense vessel or unexpected calcification. Skull: Defect is noted in  the left parietal region consistent with prior craniotomy and subdural evacuation. Sinuses/Orbits: No acute finding. Other: None CT CERVICAL SPINE FINDINGS Alignment: Within normal limits. Skull base and vertebrae: 7 cervical segments are well visualized. Vertebral body height is well maintained. Multilevel facet hypertrophic changes and osteophytic changes are seen. Disc space narrowing is noted worst at C5-6 and C6-7. No acute fracture or acute facet abnormality is noted. Soft tissues and spinal canal: Small fluid attenuation is not noted in the subcutaneous tissues of the left posterior neck likely representing a sebaceous cyst. Upper chest: Visualized lung apices are unremarkable. Other: None IMPRESSION: CT of the head: Chronic atrophic and ischemic changes without acute abnormality. Changes of prior left craniotomy. CT of the cervical spine: Multilevel degenerative change without acute abnormality. Electronically Signed   By: Inez Catalina M.D.   On: 08/30/2020 17:09   CT Cervical Spine Wo Contrast  Result Date: 08/30/2020 CLINICAL DATA:  Recent fall with headaches and neck pain, initial encounter EXAM: CT HEAD WITHOUT CONTRAST CT CERVICAL SPINE WITHOUT CONTRAST TECHNIQUE: Multidetector CT imaging of the head and cervical spine was performed following the standard protocol without intravenous contrast. Multiplanar CT image reconstructions of the cervical spine were also generated. COMPARISON:  08/18/2014 FINDINGS: CT HEAD FINDINGS Brain: No evidence of acute infarction, hemorrhage, hydrocephalus, extra-axial collection or mass lesion/mass effect. Mild atrophic changes and chronic white matter ischemic changes are noted. Vascular: No hyperdense vessel or unexpected calcification. Skull: Defect is noted in the left parietal region consistent with prior craniotomy and subdural evacuation. Sinuses/Orbits: No acute finding. Other: None CT  CERVICAL SPINE FINDINGS Alignment: Within normal limits. Skull base and vertebrae: 7 cervical segments are well visualized. Vertebral body height is well maintained. Multilevel facet hypertrophic changes and osteophytic changes are seen. Disc space narrowing is noted worst at C5-6 and C6-7. No acute fracture or acute facet abnormality is noted. Soft tissues and spinal canal: Small fluid attenuation is not noted in the subcutaneous tissues of the left posterior neck likely representing a sebaceous cyst. Upper chest: Visualized lung apices are unremarkable. Other: None IMPRESSION: CT of the head: Chronic atrophic and ischemic changes without acute abnormality. Changes of prior left craniotomy. CT of the cervical spine: Multilevel degenerative change without acute abnormality. Electronically Signed   By: Inez Catalina M.D.   On: 08/30/2020 17:09    Procedures .Critical Care E&M Performed by: Luna Fuse, MD  Critical care provider statement:    Critical care time (minutes):  30   Critical care time was exclusive of:  Separately billable procedures and treating other patients   Critical care was necessary to treat or prevent imminent or life-threatening deterioration of the following conditions:  Trauma After initial E/M assessment, critical care services were subsequently performed that were exclusive of separately billable procedures or treatment.       Medications Ordered in ED Medications - No data to display  ED Course  I have reviewed the triage vital signs and the nursing notes.  Pertinent labs & imaging results that were available during my care of the patient were reviewed by me and considered in my medical decision making (see chart for details).    MDM Rules/Calculators/A&P                          Patient presented as a trauma activation and I was rushed to the patient's bedside.  Imaging negative for acute fracture or acute bleed.  Patient declined pain  medications  here.  Will be discharged home in stable condition.  Advise follow-up with his doctor within the week.  Advising immediate return for headache new numbness weakness or any additional concerns.     Final Clinical Impression(s) / ED Diagnoses Final diagnoses:  Injury of head, initial encounter    Rx / DC Orders ED Discharge Orders    None       Luna Fuse, MD 08/30/20 1735

## 2020-08-30 NOTE — Discharge Instructions (Addendum)
Call your primary care doctor or specialist as discussed in the next 2-3 days.   Return immediately back to the ER if:  Your symptoms worsen within the next 12-24 hours. You develop new symptoms such as new fevers, persistent vomiting, new pain, shortness of breath, or new weakness or numbness, or if you have any other concerns.  

## 2020-08-30 NOTE — ED Notes (Signed)
Per MD [place], pt ok to take home carbidopa levodopa dose.

## 2020-08-30 NOTE — ED Notes (Signed)
Patient transported to CT 

## 2020-09-06 ENCOUNTER — Other Ambulatory Visit: Payer: Self-pay

## 2020-09-06 ENCOUNTER — Emergency Department (HOSPITAL_COMMUNITY): Payer: Medicare HMO

## 2020-09-06 ENCOUNTER — Emergency Department (HOSPITAL_COMMUNITY)
Admission: EM | Admit: 2020-09-06 | Discharge: 2020-09-06 | Disposition: A | Discharge status: Home / Self Care | Payer: Medicare HMO | Attending: Emergency Medicine | Admitting: Emergency Medicine

## 2020-09-06 DIAGNOSIS — N182 Chronic kidney disease, stage 2 (mild): Secondary | ICD-10-CM | POA: Diagnosis not present

## 2020-09-06 DIAGNOSIS — Z23 Encounter for immunization: Secondary | ICD-10-CM | POA: Diagnosis not present

## 2020-09-06 DIAGNOSIS — Y92009 Unspecified place in unspecified non-institutional (private) residence as the place of occurrence of the external cause: Secondary | ICD-10-CM | POA: Insufficient documentation

## 2020-09-06 DIAGNOSIS — Z85850 Personal history of malignant neoplasm of thyroid: Secondary | ICD-10-CM | POA: Insufficient documentation

## 2020-09-06 DIAGNOSIS — I4891 Unspecified atrial fibrillation: Secondary | ICD-10-CM | POA: Insufficient documentation

## 2020-09-06 DIAGNOSIS — G20 Parkinson's disease: Secondary | ICD-10-CM | POA: Diagnosis not present

## 2020-09-06 DIAGNOSIS — E039 Hypothyroidism, unspecified: Secondary | ICD-10-CM | POA: Insufficient documentation

## 2020-09-06 DIAGNOSIS — Z85828 Personal history of other malignant neoplasm of skin: Secondary | ICD-10-CM | POA: Diagnosis not present

## 2020-09-06 DIAGNOSIS — W01198A Fall on same level from slipping, tripping and stumbling with subsequent striking against other object, initial encounter: Secondary | ICD-10-CM | POA: Diagnosis not present

## 2020-09-06 DIAGNOSIS — S0990XA Unspecified injury of head, initial encounter: Secondary | ICD-10-CM | POA: Diagnosis present

## 2020-09-06 DIAGNOSIS — Z87891 Personal history of nicotine dependence: Secondary | ICD-10-CM | POA: Insufficient documentation

## 2020-09-06 DIAGNOSIS — Z7901 Long term (current) use of anticoagulants: Secondary | ICD-10-CM | POA: Diagnosis not present

## 2020-09-06 DIAGNOSIS — I5030 Unspecified diastolic (congestive) heart failure: Secondary | ICD-10-CM | POA: Diagnosis not present

## 2020-09-06 DIAGNOSIS — S61411A Laceration without foreign body of right hand, initial encounter: Secondary | ICD-10-CM | POA: Insufficient documentation

## 2020-09-06 DIAGNOSIS — Z79899 Other long term (current) drug therapy: Secondary | ICD-10-CM | POA: Diagnosis not present

## 2020-09-06 DIAGNOSIS — W19XXXA Unspecified fall, initial encounter: Secondary | ICD-10-CM

## 2020-09-06 DIAGNOSIS — I2510 Atherosclerotic heart disease of native coronary artery without angina pectoris: Secondary | ICD-10-CM | POA: Diagnosis not present

## 2020-09-06 DIAGNOSIS — I130 Hypertensive heart and chronic kidney disease with heart failure and stage 1 through stage 4 chronic kidney disease, or unspecified chronic kidney disease: Secondary | ICD-10-CM | POA: Diagnosis not present

## 2020-09-06 DIAGNOSIS — S0181XA Laceration without foreign body of other part of head, initial encounter: Secondary | ICD-10-CM | POA: Insufficient documentation

## 2020-09-06 MED ORDER — TETANUS-DIPHTH-ACELL PERTUSSIS 5-2.5-18.5 LF-MCG/0.5 IM SUSY
0.5000 mL | PREFILLED_SYRINGE | Freq: Once | INTRAMUSCULAR | Status: AC
  Administered 2020-09-06: 0.5 mL via INTRAMUSCULAR
  Filled 2020-09-06: qty 0.5

## 2020-09-06 NOTE — ED Provider Notes (Signed)
[facility] EMERGENCY DEPARTMENT Provider Note   CSN: [id number] Arrival date & time: 09/06/20  1248     History Chief Complaint  Patient presents with  . level 2   . Fall    DOMONIQUE BROUILLARD is a 87 y.o. male.  HPI   87 year old male with past medical history of [name], atrial fibrillation anticoagulated presents to the emergency department as a level 2 trauma for fall on thinners.  Patient was at home, he was leaning over and reaching for the TV remote which fell on the ground which caused him to fall forward.  He hit the right side of his head on the ground and caught himself with his right hand sustaining a skin tear.  No loss of consciousness.  Patient was helped up and brought in for evaluation.  He currently has no complaints. No syncope.  Past Medical History:  Diagnosis Date  . A-fib ([place])   . Arthritis    "right knee" (08/09/2014)  . Basal cell carcinoma   . Benign prostatic hypertrophy with urinary obstruction   . Chronic anticoagulation 12/04/2016  . Complication of anesthesia    "difficulty intubation, had to use fiberoptic 2006"  . Difficult intubation   . DJD (degenerative joint disease)   . Gout   . Hx of colonic polyps   . Hyperlipidemia   . Hypertension    sees [name]  . Hypothyroidism   . Kidney tumor    "tumor on cyst on kidney"   . 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 Mini stroke ([place])   . Parkinson disease ([place])   . Pneumonia    hx of in 1952  . Primary skin squamous cell carcinoma   . Subdural hematoma ([place]) 2006  . TIA (transient ischemic attack)    pt does not recall this hx "but may have had one today" (08/09/2014)    Patient Active Problem List   Diagnosis Date Noted  . Chronic congestive heart failure with left ventricular diastolic dysfunction ([place]) 04/01/2019  . Educated about COVID-19 virus infection 04/01/2019  . CKD (chronic kidney disease), stage II  02/23/2018  . Preventative health care 01/14/2018  . Thoracic aortic aneurysm without rupture ([place]) 03/14/2017  . Dizziness 03/14/2017  . Primary skin squamous cell carcinoma   . Parkinson disease ([place])   . Kidney tumor   . Hx of colonic polyps   . Gout   . DJD (degenerative joint disease)   . Complication of anesthesia   . Basal cell carcinoma   . Diastolic CHF ([place]) 59/56/3875  . CAD (coronary artery disease), native coronary artery 12/12/2016  . Chronic anticoagulation 12/04/2016  . NSTEMI (non-ST elevated myocardial infarction) ([place]) 12/03/2016  . Long term current use of anticoagulant therapy 12/01/2015  . Abnormal CT of the chest 11/29/2015  . Medicare annual wellness visit, subsequent 06/15/2015  . Effusion of right knee 02/23/2015  . HLD (hyperlipidemia)   . TIA (transient ischemic attack) 08/09/2014  . CRI (chronic renal insufficiency), stage 3 (moderate) ([place]) 08/09/2014  . PAF (paroxysmal atrial fibrillation) ([place]) 08/09/2014  . Right renal mass   . Diverticulosis of colon without hemorrhage 06/16/2013  . Post-operative hypothyroidism 06/16/2013  . Renal oncocytoma 06/09/2012  . Parkinson disease, symptomatic ([place]) 06/06/2011  . CARCINOMA, SKIN, SQUAMOUS CELL 12/10/2007  . MALIGNANT NEOPLASM OF THYROID GLAND 12/10/2007  . Benign prostatic hyperplasia 12/10/2007  . Essential hypertension 04/22/2007  . Generalized atherosclerosis 04/22/2007  .  Osteoarthritis 04/22/2007  . Subdural hematoma ([place]) 04/15/2004    Past Surgical History:  Procedure Laterality Date  . APPENDECTOMY    . BACK SURGERY    . CATARACT EXTRACTION W/ INTRAOCULAR LENS  IMPLANT, BILATERAL Bilateral ~ 1998  . CHOLECYSTECTOMY    . CRYOABLATION  07-10-12   "tumor on cyst on my kidney; had an ablation"  . EYE SURGERY    . INGUINAL HERNIA REPAIR Right   . IR GENERIC HISTORICAL  11/09/2015   IR RADIOLOGIST EVAL & MGMT 11/09/2015 [name], MD [facility] INTERV RAD  . IR RADIOLOGIST EVAL & MGMT   08/13/2016  . KIDNEY SURGERY    . KNEE ARTHROSCOPY Right   . LEFT HEART CATH AND CORONARY ANGIOGRAPHY N/A 12/05/2016   Procedure: LEFT HEART CATH AND CORONARY ANGIOGRAPHY;  Surgeon: [name], [name], MD;  Location: [facility];  Service: Cardiovascular;  Laterality: N/A;  . LUMBAR LAMINECTOMY/DECOMPRESSION MICRODISCECTOMY  05/14/2012   Procedure: LUMBAR LAMINECTOMY/DECOMPRESSION MICRODISCECTOMY 1 LEVEL;  Surgeon: [name], MD;  Location: [facility];  Service: Neurosurgery;  Laterality: Left;  Left Lumbar four-five Laminectomy/Diskectomy/Far lateral diskectomy  . SKIN CANCER EXCISION  "several"   "back of my neck; right eye; clavicle; nose"  . SUBDURAL HEMATOMA EVACUATION VIA CRANIOTOMY  2006  . THYROIDECTOMY     medullary carcinoma  . TONSILLECTOMY         Family History  Problem Relation Age of Onset  . Stroke Mother   . Stroke Father   . Heart disease Father   . Alzheimer's disease Brother   . Alzheimer's disease Sister     Social History   Tobacco Use  . Smoking status: Former Smoker    Packs/day: 0.80    Years: 15.00    Pack years: 12.00    Types: Cigarettes    Quit date: 04/15/1965    Years since quitting: 55.4  . Smokeless tobacco: Never Used  Vaping Use  . Vaping Use: Never used  Substance Use Topics  . Alcohol use: Yes    Alcohol/week: 5.0 standard drinks    Types: 5 Glasses of wine per week    Comment: 1 glass of wine every night (6oz glass)  . Drug use: No    Home Medications Prior to Admission medications   Medication Sig Start Date End Date Taking? Authorizing Provider  amLODipine (NORVASC) 2.5 MG tablet TAKE 1 TABLET DAILY TO TAKEWITH AMLODIPINE 5MG  FOR    TOTAL OF 7.5MG  DAILY 06/09/18   Minus Breeding, MD  amLODipine (NORVASC) 5 MG tablet TAKE 1 TABLET DAILY TO TAKEWITH 2.5MG  TO MAKE 7.5MG    DAILY 03/29/19   Minus Breeding, MD  apixaban (ELIQUIS) 2.5 MG TABS tablet Take 1 tablet (2.5 mg total) by mouth 2 (two) times daily. 05/15/17   Minus Breeding, MD  carbidopa-levodopa (SINEMET CR) 50-200 MG tablet Take 1 tablet by mouth at bedtime. 11/04/19   Star Age, MD  carbidopa-levodopa (SINEMET IR) 25-100 MG tablet Take 1.5 tablets three times a day at 8am, 12, and 8 PM. Take 2 tablets at 4pm. 07/04/20   Star Age, MD  Cyanocobalamin (VITAMIN B-12 IJ) Inject as directed.    [provider]  hydrALAZINE (APRESOLINE) 10 MG tablet Take 10 mg by mouth as directed. Take 1 tablet by mouth up to three times daily for systolic blood pressure readings above 170.    [provider]  levothyroxine (SYNTHROID) 100 MCG tablet TAKE 1 TABLET DAILY ON AN  EMPTY STOMACH WITH  A FULL  GLASS OF WATER 09/30/18   Pleas Koch, NP  losartan (COZAAR) 100 MG tablet TAKE 1 TABLET DAILY AT 2PM 11/18/19   Minus Breeding, MD  mirabegron ER (MYRBETRIQ) 50 MG TB24 tablet Take 1 tablet by mouth daily. 07/18/20   [provider]  nitroGLYCERIN (NITROSTAT) 0.4 MG SL tablet Place 1 tablet (0.4 mg total) under the tongue every 5 (five) minutes x 3 doses as needed for chest pain. 01/19/19   Pleas Koch, NP  Probiotic Product (ALIGN) 4 MG CAPS Take 1 capsule by mouth daily.    [provider]  rasagiline (AZILECT) 1 MG TABS tablet Take 1 tablet (1 mg total) by mouth daily. 03/06/20   Star Age, MD  simvastatin (ZOCOR) 40 MG tablet TAKE 1 TABLET AT BEDTIME 09/14/18   Minus Breeding, MD    Allergies    Patient has no known allergies.  Review of Systems   Review of Systems  Constitutional: Negative for fever.  HENT: Negative for congestion.   Eyes: Negative for visual disturbance.  Respiratory: Negative for shortness of breath.   Cardiovascular: Negative for chest pain.  Gastrointestinal: Negative for abdominal pain.  Musculoskeletal: Negative for back pain and neck pain.  Skin: Positive for wound.  Neurological: Negative for headaches.  Psychiatric/Behavioral:       Baseline dementia    Physical Exam Updated Vital  Signs BP (!) 197/83 (BP Location: Left Arm)   Pulse (!) 59   Temp 98.8 F (37.1 C)   Resp 13   SpO2 99%   Physical Exam Vitals and nursing note reviewed.  Constitutional:      Appearance: Normal appearance.  HENT:     Head: Normocephalic.     Mouth/Throat:     Mouth: Mucous membranes are moist.  Eyes:     Pupils: Pupils are equal, round, and reactive to light.  Cardiovascular:     Rate and Rhythm: Normal rate.  Pulmonary:     Effort: Pulmonary effort is normal. No respiratory distress.  Abdominal:     Palpations: Abdomen is soft.     Tenderness: There is no abdominal tenderness.  Musculoskeletal:     Cervical back: No rigidity or tenderness.  Skin:    General: Skin is warm.     Comments: Right palm skin tear, bleeding controlled.  2 separate linear superficial lacerations to the right lateral forehead, bleeding controlled  Neurological:     Mental Status: He is alert and oriented to person, place, and time. Mental status is at baseline.  Psychiatric:        Mood and Affect: Mood normal.     ED Results / Procedures / Treatments   Labs (all labs ordered are listed, but only abnormal results are displayed) Labs Reviewed - No data to display  EKG None  Radiology No results found.  Procedures .Marland KitchenLaceration Repair  Date/Time: 09/06/2020 4:20 PM Performed by: Lorelle Gibbs, DO Authorized by: Lorelle Gibbs, DO   Consent:    Consent obtained:  Verbal   Consent given by:  Patient   Risks discussed:  Infection Universal protocol:    Patient identity confirmed:  Arm band Anesthesia:    Anesthesia method:  None Laceration details:    Location:  Face   Face location:  Forehead   Length (cm):  3 Treatment:    Area cleansed with:  Saline Skin repair:    Repair method:  Tissue adhesive Approximation:    Approximation:  Close Repair type:  Repair type:  Simple     Medications Ordered in ED Medications - No data to display  ED Course  I have  reviewed the triage vital signs and the nursing notes.  Pertinent labs & imaging results that were available during my care of the patient were reviewed by me and considered in my medical decision making (see chart for details).    MDM Rules/Calculators/A&P                          87 year old male presents emergency department as a level 2 trauma, fall on thinners.  He had a mechanical fall at home where he sustained a right hand and right head injury.  Head CT and cervical spine CT are negative, right hand x-ray is negative.  He has a small skin tear on the right palm, no indication for closure, hand has been dressed.  Tetanus was updated.  2 small forehead lacerations were repaired with Dermabond.  Patient is otherwise baseline.  His spouse is at bedside and happy to take him from the hospital.  Patient will be discharged and treated as an outpatient.  Discharge plan and strict return to ED precautions discussed, patient verbalizes understanding and agreement.  Final Clinical Impression(s) / ED Diagnoses Final diagnoses:  None    Rx / DC Orders ED Discharge Orders    None       Lorelle Gibbs, DO 09/06/20 1621

## 2020-09-06 NOTE — ED Notes (Signed)
Patient transported to CT 

## 2020-09-06 NOTE — Progress Notes (Signed)
Orthopedic Tech Progress Note Patient Details:  [name] 04/15/1929 [id number] Level 2 trauma Patient ID: [name], male   DOB: 1928-05-09, 87 y.o.   MRN: [id number]   [name] 09/06/2020, 1:19 PM

## 2020-09-06 NOTE — ED Triage Notes (Signed)
Fall on thinners-head lac-Level 2 activated

## 2020-09-06 NOTE — Discharge Instructions (Addendum)
You have been seen and discharged from the emergency department.  Your tetanus was updated at this visit.  Your CAT scan imaging was negative, your right hand x-ray was negative.  Keep the right hand skin tear dressed for the next 48 hours.  Skin glue was placed to your forehead lacerations.  This will naturally dissolve, please see attached instructions on wound care.  Follow-up with your primary provider for reevaluation and further care. Take home medications as prescribed. If you have any worsening symptoms or further concerns for your health please return to an emergency department for further evaluation.

## 2020-09-06 NOTE — ED Provider Notes (Signed)
Emergency Medicine Provider Triage Evaluation Note  William [name] , a 87 y.o. male  was evaluated in triage.  Pt complains of fall that was mechanical in nature. Did sustain head trauma but denies loc. Is on eliquis.  Review of Systems  Positive: Head injury Negative: No loc  Physical Exam  There were no vitals taken for this visit. Gen:   Awake, no distress   Resp:  Normal effort  MSK:   Moves extremities without difficulty  Other:  Clear speech, laceration to right forehead and abrasion to right hand. No cspine ttp.  Medical Decision Making  Medically screening exam initiated at 12:52 PM.  Appropriate orders placed.  William [name] was informed that the remainder of the evaluation will be completed by another provider, this initial triage assessment does not replace that evaluation, and the importance of remaining in the ED until their evaluation is complete.    Bishop Dublin 09/06/20 1256    Isla Pence, MD 09/07/20 1654

## 2020-10-23 ENCOUNTER — Ambulatory Visit: Payer: Medicare HMO | Admitting: Neurology

## 2020-10-30 ENCOUNTER — Telehealth: Payer: Self-pay | Admitting: Neurology

## 2020-10-30 NOTE — Telephone Encounter (Signed)
Better see patient first before adjusting his medications, you can put him on our nurse practitioner schedule, whoever has opening soon, I do not mind supervised NP while Dr. Rexene Alberts is not at office.

## 2020-10-30 NOTE — Telephone Encounter (Signed)
Pt's wife called has some questions, says her husband's mobility has declined. Wife is requesting a call back.

## 2020-10-30 NOTE — Telephone Encounter (Signed)
Spoke to wife of pt. He had appt scheduled last week, but due to Dr. Rexene Alberts called out ill, they rescheduled not until 12/2020.  Pt is having some freezing spells (worsening)  taking sinemet 25/100tab 1.5 tabs TID (8-12-8PM) takes 2 tabs at 1600. Also sinemet 50/200CR 1 tab at bedtime.  He is having freezing spells early am, in night and in afternoon (sporadic).  Recommendations?  Relayed Dr. Rexene Alberts out of office, would ask WID and see may defer to when Dr. Rexene Alberts returns.  She appreicated call back.

## 2020-10-31 NOTE — Telephone Encounter (Signed)
I called pt and wife.  He is having freezing spells (gets up at 5-6 am, then goes back to sleep then prior to his am dose 0900) that is when he may have notable spell.  I offered to see NP 11-01-20 at 1130, but there family is coming in town and she feels that they cannot make that.  Appreciated this though. I relayed will send message to Dr. Rexene Alberts and see what she recommends.  His next appt is in 12/21/2020 1400.  This will be next week.

## 2020-11-06 NOTE — Telephone Encounter (Signed)
Please call patient or his wife back. He can try to increase his morning Sinemet dose to 2 pills.  He is currently taking 1-1/2 pills 3 times a day and for the 4 PM dose he takes 2 pills.  He can increase the morning dose for now to 2 pills to see if this helps with his morning freezing spells.

## 2020-11-06 NOTE — Telephone Encounter (Addendum)
I called the pt's SO ( Ms. Vernie Shanks) ok per DRP and advised of Dr. Guadelupe Sabin recommendation. She was a agreeable to this. I did advised of common side effects from increasing the Carb/levo dosage ( namely, sleepiness, nausea, dyskinesia and hallucinations). She verbalized understanding and she will keep the f/u scheduled for the patient for September.

## 2020-11-14 ENCOUNTER — Telehealth: Payer: Self-pay | Admitting: Neurology

## 2020-11-14 NOTE — Telephone Encounter (Signed)
I really do not have any additional recommendations other than perhaps getting checked out by his primary care for blood pressure fluctuations and keeping a log.  If he consistently has low blood pressure values in the mornings, he may have to reduce his Sinemet back to 1-1/2 pills at 8 AM, 12 and 8 PM and he can continue with the 2 pills at 4 PM as before.

## 2020-11-14 NOTE — Telephone Encounter (Signed)
Spoke with patient's significant other.  She stated that twice since the Sinemet was increased he has had 2 episodes of low blood pressure upon standing.  Once was last week when he jumped up after a meal and the second time was today upon standing.  He does have a blood pressure machine and she took his blood pressure when he had the episode today however she cannot locate the value but she did state it was very low.  He has not fallen.  She was able to lower him to his knees.  She stated he is fine now and his blood pressure was recently 158/79 with heart rate 46.  She states his heart rate is normally supposed to be around 50-52.  Earlier this morning she took his blood pressure and states it was 202/153 but after a few minutes of sitting still and drinking water it decreased to 123/73.  She states he has a new machine and she is unsure if the high blood pressure was actually accurate because he was not having any symptoms at that time.  She has reassured me that he has not fallen and hit his head as she understands if this were to happen he needs to call 911 to have EMS evaluate him.  She believes the Sinemet dose was increased too much on her part.  We discussed that Dr. Rexene Alberts had only increased the morning 8 AM dose to 2 tablets.  The 12 PM and 8 PM should continue at 1.5 tablets and 4 p.m. is 2 tablets.  The significant other stated she had given him 2 tablets at 12 PM in addition to the others as scheduled.  She also wanted it mentioned that the patient has a burst blood vessel in his right eye worse yesterday than today.  No vision problems.  She understands if he has another episode we recommend she call 911 for evaluation.  I let her know I will send a message to Dr. Rexene Alberts for further recommendation.

## 2020-11-14 NOTE — Telephone Encounter (Signed)
Pt's significant other called stating that the pt's BP is pretty low and is needing to discuss with RN.

## 2020-11-14 NOTE — Telephone Encounter (Signed)
I spoke with William [name] again and let her know that Dr Rexene Alberts reviewed our phone call and has advised to keep a log of pt's BP and review with PCP. Also I spoke with Dr Rexene Alberts and she has advised for now to continue the current Sinemet IR schedule. I reviewed this once more with William [name]: 2 tablets at 8 AM, 1.5 tablets at 12 PM, 2 tablets at 4 pm, and 1.5 tablets at 8 PM. She let me know that she had already made an appt for pt to see PCP and she plans to have him see cardiology as well because he has not seen them in awhile. She verbalized appreciation for the call.

## 2020-11-27 ENCOUNTER — Telehealth: Payer: Self-pay | Admitting: Cardiology

## 2020-11-27 MED ORDER — LOSARTAN POTASSIUM 100 MG PO TABS: ORAL_TABLET | ORAL | 3 refills | Status: DC

## 2020-11-27 NOTE — Telephone Encounter (Signed)
*  STAT* If patient is at the pharmacy, call can be transferred to refill team.   1. Which medications need to be refilled? (please list name of each medication and dose if known)  losartan (COZAAR) 100 MG tablet  2. Which pharmacy/location (including street and city if local pharmacy) is medication to be sent to? [facility] [address] [place], [place] AT [facility] to [facility]istered [employer] [facility]  3. Do they need a 30 day or 90 day supply? 90 with refills

## 2020-12-21 ENCOUNTER — Other Ambulatory Visit: Payer: Self-pay

## 2020-12-21 ENCOUNTER — Encounter: Payer: Self-pay | Admitting: Neurology

## 2020-12-21 ENCOUNTER — Ambulatory Visit: Payer: Medicare HMO | Admitting: Neurology

## 2020-12-21 VITALS — BP 174/81 | HR 57 | Ht 76.0 in | Wt 181.4 lb

## 2020-12-21 DIAGNOSIS — R269 Unspecified abnormalities of gait and mobility: Secondary | ICD-10-CM | POA: Diagnosis not present

## 2020-12-21 DIAGNOSIS — R5381 Other malaise: Secondary | ICD-10-CM

## 2020-12-21 DIAGNOSIS — M25461 Effusion, right knee: Secondary | ICD-10-CM

## 2020-12-21 DIAGNOSIS — G20 Parkinson's disease: Secondary | ICD-10-CM

## 2020-12-21 DIAGNOSIS — M25561 Pain in right knee: Secondary | ICD-10-CM

## 2020-12-21 DIAGNOSIS — I998 Other disorder of circulatory system: Secondary | ICD-10-CM

## 2020-12-21 DIAGNOSIS — Z9181 History of falling: Secondary | ICD-10-CM

## 2020-12-21 NOTE — Patient Instructions (Addendum)
It was nice to see you again today.  I am glad to hear that you have been doing a little better lately.  I think physical therapy is going to help you.   As discussed, I would like to see if your blood pressure holds a little bit steadier and does not drop significantly around 10 AM if we went back to the Sinemet 1-1/2 pills at 8 AM.  Please reduce it for the next couple of weeks back to 1-1/2 pills at 8 AM instead of 2 pills at 8 AM.  Check your blood pressure around 10 AM and keep a log.    Please also discuss with your cardiologist if there is a possibility of splitting of your amlodipine.  You have experienced some high blood pressure values before bedtime.  I wonder if you could take your amlodipine 5 mg strength at 6 PM and another 2.5 mg around 8 PM.  You could check your blood pressure before bedtime around 9 PM and keep a log.  Please continue to take your Parkinson's medications otherwise as scheduled.  We will keep you on the Azilect and your Sinemet CR and keep the Sinemet immediate release the same with the exception of the above change.  Please let us know by phone call in a couple of weeks.  Please follow-up routinely in this clinic in about 4 months.  Please use your cane consistently and monitor your right knee pain and swelling.  You may need to see your primary care or orthopedic doctor for your right knee swelling.  There may be a little bit of fluid in the knee as well.

## 2020-12-21 NOTE — Progress Notes (Signed)
Subjective:    Patient ID: William [name] is a 87 y.o. male.  HPI    Interim history:   [name] is a 87 year old right-handed gentleman with an underlying medical history of right TIA in January 2003, SDH (s/p left craniotomy in August 2006), hypertension, hypothyroidism, thyroid cancer, partial onset seizures in the past (off Dilantin), lumbar spine disease, status post lower back surgery at L4-5 in January 2014, renal tumor, status post kidney tumor ablative surgery in March 2014, who presents for followup consultation of his right-sided predominant [name]. He is accompanied by his GF/partner, [name] again today. I last saw him on 07/04/2020, at which time we talked about his fall in the bathroom.  He had become lightheaded and fell through the glass shower door which shattered.  He did not get immediately checked out and they did not call 911.  He has had multiple Mohs surgeries through dermatology.  We talked about the importance of fall prevention and his risk for injuries.  He was advised to continue with his Parkinson's medications. He had interim emergency room visits.  On 08/30/2020 he presented to [facility] ED after a fall.  I reviewed the emergency room records.  He had a head CT and cervical spine CT without contrast on 08/30/2020 and I reviewed the results: IMPRESSION: CT of the head: Chronic atrophic and ischemic changes without acute abnormality.   Changes of prior left craniotomy.   CT of the cervical spine: Multilevel degenerative change without acute abnormality.  He presented to the emergency room on 09/06/2020 after a fall.  He sustained a right hand skin tear.  I reviewed the emergency room records.  He was treated for small forehead lacerations as well as a skin tear on the right palm.  He had a head CT and cervical spine CT without contrast on 09/06/2020 and I reviewed the results: IMPRESSION: No acute abnormality head or cervical spine.    Atrophy and chronic microvascular ischemic change.   Cervical spondylosis.  [name] called in the interim in July requesting a increase in the Sinemet.  She called in early August 2022 because of significant blood pressure fluctuations, particularly low values in the mornings.  I suggested we consider reducing his morning Sinemet dose back to 1-1/2 pills rather than 2 pills at 8 AM.  Today, 12/21/2020: He reports feeling okay, feels quite stable at this time.  Has not had any recent falls other than in May.  He is more careful, uses his cane more consistently.  He has had right knee pain and some swelling, has been using a soft elastic brace.  He also started physical therapy through [facility] about a week ago and it is going well.  He will have physical therapy about twice a week for now.  He still has significant blood pressure fluctuation.  [name] brought a log and I reviewed it.  He has had lower blood pressure values in the late morning.  He has gone down to 80s or 90s over higher 50s and 60s.  He has higher values sometimes at bedtime she reports.  She does not always keep a log but has been consistently measuring his blood pressure about twice daily.  Of note, he takes his losartan around 2 PM and at 6 PM he takes Norvasc 5 mg +2.5 mg.  He has hydralazine for as needed use, 10 mg up to 3 times daily as needed but if at all he takes it only once.  He tries to hydrate well with water, drinks a cup of coffee in the morning, takes his 8 AM dose of medication and then often goes back to sleep and gets up around 930 a.m.  He has breakfast around 10 AM and lunch around 2 PM typically.  They typically go to the dining hall.  He has had a couple of near falls and she was able to assist him.  Sometimes he just gets up too quickly.  He still has pending skin cancer surgeries.  The neck surgery is planned for October.   The patient's allergies, current medications, family history, past medical history, past  social history, past surgical history and problem list were reviewed and updated as appropriate.    Previously (copied from previous notes for reference):    I saw him on 03/06/2020, at which time he reported feeling stable.  He had started Myrbetriq per urology which helped reduce his nighttime urination.  He did not have any recent falls.  He had recent Mohs procedures which limited his physical activity.  He had other procedures pending.  He was having reasonably good results with respect to constipation control with MiraLAX.  He was advised to continue with Azilect generic once daily, Sinemet IR and Sinemet CR on his prior regimen.       I saw him on 11/04/2019, at which time he reported a recent fall.  He did not sustain any major injuries thankfully, but did scrape his knee.  He did get checked out at [facility] ER and had CT scans of his head and neck which did not show any acute injuries thankfully.  He was advised to use his cane consistently and also consider using a walker for outside use for better stability.  I suggested we add Sinemet CR at bedtime to his regimen.       I saw him on 07/05/2019 , At which time he reported doing fairly well.  She had noticed more forgetfulness.  We continued his medications at the current dose and timings, with the exception of increasing the 4 PM dose to 2 pills instead of 1-1/2 pills, in the hopes that he would have slightly better symptom control when they would go to the dining hall in the evening for their dinner.     I saw him on 03/03/19, at which time he reported feeling stable, he had a recent swallow evaluation at home.  He was taking Azilect once daily around lunchtime rather than first thing in the morning because of lower blood pressure values first thing in the morning.     I saw him on 11/04/18, at which time he reported having intermittent spells of feeling weaker.  He had some issues with his mobility.  They were settling in their condo,  had moved in April 2020 to [place].  He requested physical, occupational and speech therapy through [facility] at [facility].    In the interim, a swallow evaluation was recommended through his speech therapist.     I saw him on 05/07/2018, at which time he reported feeling fairly well and stable.  No recent falls reported.  I suggested we continue with Azilect once daily and Sinemet 1-1/2 pills 4 times a day, prescriptions typically are through the [place].     I saw him on 01/05/2018, at which time he reported a recent fall. He scraped his left elbow. He has had some right-sided knee pain and felt like his knee had  given out. Otherwise, he was doing rather well. We mutually agreed to keep his medication regimen the same. She was advised to add some more strength training to his exercise regimen and stay well-hydrated.    I saw him on 09/02/2017, at which time he was quite stable motor-wise, he had some stressors including regarding [name]' health and they were trying to consolidate their households with a plan to move into a retirement community condo by next year. He was advised to continue with generic Azilect once daily and Sinemet generic 1-1/2 pills 4 times a day. He was encouraged to hydrate better with water.   I saw him on 05/05/2017, at which time he reported doing quite well, no recent falls or recent illness. They were planning to move into a retirement community by the end of this year. He was trying to exercise regularly. Blood pressure was stable. He had seen pulmonology for a lung nodule which was stable. I suggested we continue with his current medication regimen.   I saw him on 12/31/2016, at which time he reported intermittent lightheadedness. He had some blood pressure fluctuations with really high values at times. He was hospitalized in August 2018 for a non-STEMI. He had significant orthostatic hypotension during the office visit. He was advised to talk to his  providers about reducing his blood pressure medication. He was advised to not continue with the boxing classes for [name] but exercise within his limitations at home. He was advised to try melatonin at night for sleep issues.    I saw him on 06/27/2016, at which time he reported doing well, he had a trip to [place] in [place] planned for springtime. He had no recent falls. He was supposed to be on a smaller dose of Eliquis, namely 2.5 mg twice a day. He continued to participate in the boxing class for [name] and felt that they were helpful.   I saw him on 03/05/2016, at which time he reported doing okay. He was during his stay active, was participating in [place] steady boxing, which he felt was helpful. He was avoiding strenuous parts of the exercise. He was interested in participating in a new drug study at [facility]. This was to investigate apomorphine sublingual to decrease off time. He was on Azilect once daily. He was taking Sinemet 4 times a day. He was trying to hydrate well, reported no recent falls. I suggested he increase the Sinemet to 1-1/2 pills 4 times a day. I suggested he continue with generic Azilect once daily.   I saw him on 10/26/2015, at which time he reported doing quite well, had some stumbling episodes, no actual falls, was trying to drink enough water and was still physically active. He had signed up for rock steady boxing classes but [name] noted that he would get too exhausted.   I saw him on 06/22/2015, at which time he reported doing fairly well. He had finished outpatient OT, PT, and ST. Unfortunately, he did take a fall in the garage couple of months prior as he was coming in from the graduate, stepped backwards on the stairs and slipped off falling backwards, landed on his behind, head struck the car, he denied loss of consciousness or headache or bruise and no sequelae were reported. He was having rails installed in the garage entrance. Was still  trying to stay active, playing golf. We mutually agreed to continue with Sinemet 4 times a day and Azilect once daily. We talked about fall risk  and gait safety and fall precautions.   I saw him on 02/23/2015, at which time he reported doing fairly well. He was playing golf regularly. His right knee was bothering him. He had no recent falls. He was trying to hydrate well enough. He had no new issues with A. fib and no new complaints otherwise. I noted right knee swelling. He was advised to follow-up with his primary care physician for this. We mutually agreed to keep his Parkinson's medications the same.    I saw him on 11/01/2014 at which time he reported a recent diagnosis with A. fib. He was admitted to the hospital in April. I reviewed the hospital records from 07/17/2014 through 07/18/2014. He was started on Xarelto. He was then re-admitted on 08/09/14 to 08/10/14 due to altered mental status and slurring of speech and was suspected to have a TIA. He was seen by neuro in consultation and a baby aspirin was added to Xarelto. He presented back to the ER on 08/18/14. He a [place] wo contrast on  08/18/2014 , which was negative for any acute changes. He was seen by the Neurologist in the emergency room and it was felt that a full TIA workup was not necessary at the time. He had presented with hypertension and some slurring of speech. He was switched from Xarelto to Eliquis, because the Xarelto is not on formulary at the [place].  He endorsed recent stressors, including the recent passing of his younger brother a week prior with advanced Alzheimer's disease.    He had an MRI brain and MRA head on 08/09/14: MRI HEAD: No acute intracranial process, specifically no acute ischemia. Involutional changes. Mild white matter changes suggest chronic small vessel ischemic disease. MRA HEAD: No acute large vessel occlusion or high-grade stenosis. Mid grade stenosis RIGHT P2/3 segment.    I suggested we continue with Sinemet at 4  times a day. He was advised to continue with Azilect as well. He was advised to drink more water. In the interim, we restarted outpatient physical therapy, occupational therapy and speech therapy.   I saw him on 04/29/2014, at which time he reported doing well overall but he was more fatigued and had some excessive daytime somnolence. Memory was stable. His mood was stable. He noted some blood pressure fluctuations. Sometimes he had a dull headache and sometimes he felt lightheaded. He had some anxiety over his lady friend's health, as she had fallen and broken rib. He continued to walk regularly and played golf. He had no recent falls. I asked him to continue with his medications, Azilect once daily and Sinemet 4 times a day.   I saw him on 10/28/2013, at which time he reported doing well. In particular, had no cognitive complaints, no mood issues, and continued to play golf 3 times a week. He was driving well. I suggested an increase in his levodopa to 1 pill 4 times a day of the 25-100 milligrams strength. [facility] and had seen a [place] neurologist as well.    I saw him on 12/07/2012, at which time I felt that he was doing well on Azilect and levodopa. He called in December 2014 with problems with blood pressure fluctuations. He was wondering if this came from the [place] but I did not think it was due to the Azilect per se. I was reluctant to take him off of it.    I first met him on 06/03/2012 and he previously followed  by Dr. Morene Antu. He has had primarily right-sided symptoms with regards to his Parkinson's, diagnosed in 2010 with symptoms dating back to late 2009 or early 2010. He had briefly tried Mirapex but was taken off d/t hypotension. L spine MRI in June 2013 showed renal cysts, degenerative joint disease most prominent at L4-5. He tried acupuncture. His MRI of the lumbar spine showed abnormalities with his kidney with a cyst and a tumor and he had  ablative surgery on 07/10/12 and had a FU CT done. He had lower back surgery on 05/14/12.  I saw him back on 09/03/2012 and I suggested starting Azilect. He has been tolerating it well and both he and his girlfriend felt that he did better with it in terms of dexterity and fine motor control. He had some hypotension and lightheadedness and reduced his BP medication. He has been having some issue with gout.   His Past Medical History Is Significant For: Past Medical History:  Diagnosis Date   A-fib [facility])    Arthritis    "right knee" (08/09/2014)   Basal cell carcinoma    Benign prostatic hypertrophy with urinary obstruction    Chronic anticoagulation 1/65/5374   Complication of anesthesia    "difficulty intubation, had to use fiberoptic 2006"   Difficult intubation    DJD (degenerative joint disease)    Gout    Hx of colonic polyps    Hyperlipidemia    Hypertension    sees [name]   Hypothyroidism    Kidney tumor    "tumor on cyst on kidney"    Malignant neoplasm of thyroid gland ([place])    medullary carcinoma   Mini stroke ([place])    Parkinson disease ([place])    Pneumonia    hx of in 1952   Primary skin squamous cell carcinoma    Subdural hematoma ([place]) 2006   TIA (transient ischemic attack)    pt does not recall this hx "but may have had one today" (08/09/2014)    His Past Surgical History Is Significant For: Past Surgical History:  Procedure Laterality Date   APPENDECTOMY     BACK SURGERY     CATARACT EXTRACTION W/ INTRAOCULAR LENS  IMPLANT, BILATERAL Bilateral ~ [place]     CRYOABLATION  07-10-12   "tumor on cyst on my kidney; had an ablation"   EYE SURGERY     INGUINAL HERNIA REPAIR Right    IR GENERIC HISTORICAL  11/09/2015   IR RADIOLOGIST EVAL & MGMT 11/09/2015 [name], MD [facility] INTERV RAD   IR RADIOLOGIST EVAL & MGMT  08/13/2016   KIDNEY SURGERY     KNEE ARTHROSCOPY Right    LEFT HEART CATH AND CORONARY ANGIOGRAPHY N/A 12/05/2016   Procedure:  LEFT HEART CATH AND CORONARY ANGIOGRAPHY;  Surgeon: [name], [name], MD;  Location: [facility];  Service: Cardiovascular;  Laterality: N/A;   LUMBAR LAMINECTOMY/DECOMPRESSION MICRODISCECTOMY  05/14/2012   Procedure: LUMBAR LAMINECTOMY/DECOMPRESSION MICRODISCECTOMY 1 LEVEL;  Surgeon: [name], MD;  Location: [facility];  Service: Neurosurgery;  Laterality: Left;  Left Lumbar four-five Laminectomy/Diskectomy/Far lateral diskectomy   SKIN CANCER EXCISION  "several"   "back of my neck; right eye; clavicle; nose"   SUBDURAL HEMATOMA EVACUATION VIA CRANIOTOMY  2006   THYROIDECTOMY     medullary carcinoma   TONSILLECTOMY      His Family History Is Significant For: Family History  Problem Relation Age of Onset   Stroke Mother  Stroke Father    Heart disease Father    Alzheimer's disease Sister    Alzheimer's disease Brother    [name] Neg Hx     His Social History Is Significant For: Social History   Socioeconomic History   Marital status: Widowed    Spouse name: Not on file   Number of children: 3   Years of education: 15   Highest education level: Not on file  Occupational History   Occupation: retired  Tobacco Use   Smoking status: Former    Packs/day: 0.80    Years: 15.00    Pack years: 12.00    Types: Cigarettes    Quit date: 04/15/1965    Years since quitting: 55.7   Smokeless tobacco: Never  Vaping Use   Vaping Use: Never used  Substance and Sexual Activity   Alcohol use: Yes    Alcohol/week: 5.0 standard drinks    Types: 5 Glasses of wine per week    Comment: 1 glass of wine every night (6oz glass)   Drug use: No   Sexual activity: Yes  Other Topics Concern   Not on file  Social History Narrative   Married.   Lives at home with significant other.   Retried. 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   Enjoys golfing, yard work, spending time with family.     Social Determinants of Health   Financial Resource Strain: Not on file  Food  Insecurity: Not on file  Transportation Needs: Not on file  Physical Activity: Not on file  Stress: Not on file  Social Connections: Not on file    His Allergies Are:  No Known Allergies:   His Current Medications Are:  Outpatient Encounter Medications as of 12/21/2020  Medication Sig   amLODipine (NORVASC) 2.5 MG tablet TAKE 1 TABLET DAILY TO TAKEWITH AMLODIPINE 5MG FOR    TOTAL OF 7.5MG DAILY   amLODipine (NORVASC) 5 MG tablet TAKE 1 TABLET DAILY TO TAKEWITH 2.5MG TO MAKE 7.5MG   DAILY   apixaban (ELIQUIS) 2.5 MG TABS tablet Take 1 tablet (2.5 mg total) by mouth 2 (two) times daily.   carbidopa-levodopa (SINEMET CR) 50-200 MG tablet Take 1 tablet by mouth at bedtime. (Patient taking differently: Take 1 tablet by mouth 2 (two) times daily. Pt takes 2 tablets in am 2 tablets at 4 . 1.5 at noon 1.5 8pm)   carbidopa-levodopa (SINEMET IR) 25-100 MG tablet Take 1.5 tablets three times a day at 8am, 12, and 8 PM. Take 2 tablets at 4pm.   Cyanocobalamin (VITAMIN B-12 IJ) Inject as directed.   hydrALAZINE (APRESOLINE) 10 MG tablet Take 10 mg by mouth as directed. Take 1 tablet by mouth up to three times daily for systolic blood pressure readings above 170.   levothyroxine (SYNTHROID) 100 MCG tablet TAKE 1 TABLET DAILY ON AN  EMPTY STOMACH WITH A FULL  GLASS OF WATER   losartan (COZAAR) 100 MG tablet TAKE 1 TABLET DAILY AT 2PM   mirabegron ER (MYRBETRIQ) 50 MG TB24 tablet Take 1 tablet by mouth daily.   nitroGLYCERIN (NITROSTAT) 0.4 MG SL tablet Place 1 tablet (0.4 mg total) under the tongue every 5 (five) minutes x 3 doses as needed for chest pain.   Probiotic Product (ALIGN) 4 MG CAPS Take 1 capsule by mouth daily.   rasagiline (AZILECT) 1 MG TABS tablet Take 1 tablet (1 mg total) by mouth daily.   simvastatin (ZOCOR) 40 MG tablet TAKE 1 TABLET AT BEDTIME  No facility-administered encounter medications on file as of 12/21/2020.  :  Review of Systems:  Out of a complete 14 point review of  systems, all are reviewed and negative with the exception of these symptoms as listed below:  Review of Systems  Neurological:        Pt is here for follow up visit for parkinson's .Wife had concerns about BP . Pt has no falls since last visit . Pt has no concerns at this time. Pt wife states pt has been getting more exercise .    Objective:  Neurological Exam  Physical Exam Physical Examination:   Vitals:   12/21/20 1411  BP: (!) 174/81  Pulse: (!) 57    General Examination: The patient is a very pleasant 87 y.o. male in no acute distress. He appears frail, well-groomed, and in good spirits.  He has a single-point cane with him today.   HEENT: Normocephalic, atraumatic, postsurgical changes to his left ear. Pupils are equal, round and reactive to light, extraocular tracking shows moderate saccadic breakdown, moderate speech difficulty noted including moderate dysarthria and hypophonia.  Hearing is mildly impaired, airway examination shows stable findings. Moderate to severe nuchal rigidity and decrease in passive range of motion. Multiple sites of prior skin surgeries, scarring behind the left ear and by the left lateral neck and below the left clavicle.  Moderate mouth dryness noted.   Chest: Clear to auscultation without wheezing, rhonchi or crackles noted.   Heart: S1+S2+0, regular and normal without murmurs, rubs or gallops noted.    Abdomen: Soft, non-tender and non-distended.   Extremities: The shin areas show some scarring and eschar.  Chronic mild bruising noted throughout upper and lower extremities.     Skin: Warm and dry without trophic changes noted. Mild bruising on hands, stable, chronic.  Also recent healed cut right lateral palm.   Musculoskeletal: exam reveals mild medial right knee swelling, could be some fluid collection.   Neurologically:  Mental status: The patient is awake, alert and oriented in all 4 spheres. His immediate and remote memory, attention,  language skills and fund of knowledge are appropriate. There is no evidence of aphasia, agnosia, apraxia or anomia. Thought process is linear. Mood is normal and affect is normal.  Cranial nerves II - XII are as described above under HEENT exam.  Motor exam: thin muscle bulk globally, global strength is about 4/5. He has increased tone in the right more than left upper and lower extremities. He has a resting tremor in both upper extremities, R>L, more on the moderate side today.  Fine motor skills are moderately impaired on the right and slightly better on the left. He has no significant dyskinesias today.  He stands up with mild difficulty and pushes himself up. Posture is more stooped today.  He walks with decreased stride length and decrease pace, decreased arm swing bilaterally, using a cane today, on the right.  Insecurity with turns but needs no assistance today.  Sensory exam is intact to light touch. Cerebellar testing shows no additional concern for dysmetria or intention tremor or gait ataxia.   Assessment and Plan:    In summary, AMEEN MOSTAFA is a very pleasant 87 year old male with an underlying complex medical history of right TIA in January 2003, SDH (s/p left craniotomy in August 2006), hypertension, hypothyroidism, thyroid cancer, partial onset seizures (off Dilantin), lumbar spine disease, status-post lower back surgery at L4-5 in January 2014, renal tumor, status post kidney tumor ablative surgery in  March 2014, who presents for followup consultation of his advanced right-sided predominant [name], complicated by blood pressure fluctuation, likely in part due to autonomic dysregulation, history of falls, and overall muscular deconditioning and balance issues, sleep disturbance, mild memory issues and chronic constipation.  He has a remote history of dream enactment behavior.  He has had multiple skin cancer surgeries.  He has had more blood pressure fluctuations.  We  increased his Sinemet morning dose to 2 pills recently but he may have had some blood pressure drop from it.  We mutually agreed to go back to 1-1/2 pills at 8 AM, he takes 2 pills at 4 PM and 1-1/2 pills otherwise.  He is also advised to talk to his cardiologist about potentially splitting up his amlodipine dose.  He takes 7.5 mg at 6 PM.  He has had higher blood pressure values right before bedtime.  He takes his Cozaar at 2 PM in the afternoon.  He is advised to check with cardiology and see if it would help to take 5 mg of amlodipine at 6 PM and another 2.5 mg at around 8 PM.  I am not sure if it will make a big difference.  Nevertheless, [name] has been keeping a log.  From my end of things, he will go back down on the 8 AM dose of levodopa to 1.5 pills.  He did not need a refill on his prescriptions today.  He is advised to continue with his Azilect and Sinemet CR as is.  He has been in physical therapy.  He is advised to use his cane at all times and monitor his right knee pain and swelling.  There may be a little bit of fluid in it.  He is advised to consider seeing his primary care or orthopedic specialist for this.  We talked about the importance of fall prevention and maintaining a healthy lifestyle.  He is at high risk for injuries given his advanced age, advanced [name], prior history of subdural hematoma, being on a blood thinner. He continues to take Azilect 1 mg once daily and will again take Sinemet generic 1-1/2 pills 3 times a day and 2 pills at 4 PM.  We started Sinemet CR at bedtime in July 2021 and he noticed improvement in his mobility at night.  Also, adding Myrbetriq through urology has helped his nighttime urination.  He has skin cancer surgeries planned for October 2022.  Generally speaking, his constipation is under control with daily probiotic and nearly daily MiraLAX. He will follow-up routinely in this clinic in 4 months, sooner if needed.  I answered all their questions  today and the patient and [name] were in agreement.  I spent 40 minutes in total face-to-face time and in reviewing records during pre-charting, more than 50% of which was spent in counseling and coordination of care, reviewing test results, reviewing medications and treatment regimen and/or in discussing or reviewing the diagnosis of PD, the prognosis and treatment options. Pertinent laboratory and imaging test results that were available during this visit with the patient were reviewed by me and considered in my medical decision making (see chart for details).

## 2020-12-29 ENCOUNTER — Telehealth: Payer: Self-pay | Admitting: Neurology

## 2020-12-29 NOTE — Telephone Encounter (Signed)
Pt's sgo called wanting to inform provider that after a few ups and downs with the pt's BP the went back to giving the pt the medications at 2pm and 6pm and his BP is under control now and it has been this way for over a week. If there are any other questions please call her back at her mobile number.

## 2021-01-01 NOTE — Telephone Encounter (Signed)
Noted  

## 2021-01-01 NOTE — Telephone Encounter (Signed)
Noted, thank you

## 2021-01-16 NOTE — Telephone Encounter (Signed)
Pt's sgo other called again needing to speak to an RN or provider regarding the BP drops that continue to happen.

## 2021-01-17 NOTE — Telephone Encounter (Signed)
I called pts SO [name], I relayed Dr. Guadelupe Sabin recommendations about decreasing the sinemet 25/100 to where he takes 1.5 tablet qid (slight decrease at the 1600 dose).  She will f/u with cardiology about Bp meds regimen. Then also connect with surgeon relating to [place] upcoming surgery concerning his BP.  I relayed no mention of azilect change for now.  Make small increment changes and see how he adjusts. Moved up appt for NP visit and labs for this week , will forward results to [place] for FYI.  She appreciated call back.

## 2021-01-17 NOTE — Telephone Encounter (Signed)
I called wife of patient.  He is having some episodes of low blood pressure over the last 3 days.66/49, 83/77, 68/42 which was yesterday.  She is questioning if taking carbidopa 1-1/2 tablets of the 25/100 tabs along with the Azilect if that is could be the issue?  He is taking the  carbidopa levodopa 50/200mg  at bedtime and the 25/100 CL 1.5 tabs 8-12-8pm, 2 tabs  4p. He is also sleeping more, taking tylenol for pain in his back R side.   I relayed that needs to hydrate.  (He has been drinking less).  Also has [place] procedure next week on leg, would you recommend holding off until Bp more stablized.  I instructed to contact dermatology but be glad to get her input.  She stated that due to bp low did not give the losartan but then had to hydralzaine to bring down later on.  I stated that she needed to consult cardiology about his Bp med amlodipine and there recommendation.

## 2021-01-17 NOTE — Telephone Encounter (Signed)
Please advise them that they would have to check with the surgeon if he stable for surgery.  I would recommend to follow-up with cardiology.  They will need to find out if his blood pressure medication regimen needs to be altered.  We can also reduce the Sinemet down to 1 pill 4 times a day eventually if need be.  For now, I recommend he take Sinemet 1-1/2 pills 4 times a day (just a slight reduction from currently taking 2 pills at 4 PM).

## 2021-03-20 ENCOUNTER — Telehealth: Payer: Self-pay | Admitting: Neurology

## 2021-03-20 NOTE — Telephone Encounter (Signed)
I called pt and pt had his MOHS surgery, had fall (hit head had staples) spent 2 wks in [facility].  Has been home now for a week.  He is taking CL 1.5tabs (25/100) QID.  8-12-4-8 then 50/200mg  at bedtime.  Bp drops 1 hour after taking CL 0800 and 1200 (75/51 and 77/54) cannot walk, freezes. Bp meds amlodipine 7.5mg  am, losartan 100mg  afternoon. Taking as ordered per pcp. Hydrazaline as needed.  He drinks glass water in am prior to getting up.  Stops drinking after 6p due to getting up to restroom during night.  I relayed that Dr. Rexene Alberts out of office , due back hopefully tomorrow. Will send message and will call her back/  next appt is 04-25-21 scheduled.

## 2021-03-20 NOTE — Telephone Encounter (Signed)
Pt;s wife Delfin Edis (on [name]) hour and half after taking carbidopa-levodopa (SINEMET CR) 50-200 MG tablet his pressure drops. He can not walk until symptom passes. Did not go the ER. Would like a call from the nurse.

## 2021-03-21 NOTE — Telephone Encounter (Signed)
I spoke with William [name] and discussed the new recommendations from Dr Rexene Alberts as noted below. William [name] took notes. Pt will take 1 tablet of sinemet 25-100 5 times daily (every 3 hours) with a proposed schedule of 8 AM, 11 AM, 2 PM, 5 PM, and 8 PM. He can take the Sinemet long acting at bedtime as before. William [name] stated his bedtime is between 9 and 10 PM. Her questions were answered and she verbalized appreciation. She will plan to f/u as scheduled on 04/25/21 but will call sooner if she has any patient concerns.

## 2021-03-21 NOTE — Telephone Encounter (Signed)
I think we have to reduce the individual dose of Sinemet due to blood pressure drop and increase the frequency to every 3 hours.  Please ask patient to take Sinemet 25-100 mg strength 1 pill 5 times a day at 8, 11, 2 PM, 5 PM, and 8 PM daily.  He can take that long-acting Sinemet at bedtime as before.

## 2021-03-26 NOTE — Telephone Encounter (Signed)
Spoke with William [name] and discussed, per Dr Rexene Alberts, that she would rather the patient keep his Sinemet regimen the same at this time. With the patient's blood pressure being very brittle it will be dangerous if the blood pressure goes too low.  William [name] verbalized understanding.  She stated we would keep the regimen as is and she will continue to monitor.  She will reach back out before his next appointment if needed.  She mentioned that sometimes when his tremors are really bad when she tries to take his blood pressure it may not be an accurate reading but she waits for his arm to relax before taking again. She stated right now he is very steady. She was appreciative of the call back.

## 2021-03-26 NOTE — Telephone Encounter (Signed)
If his blood pressure is stable, I would rather he keep his Sinemet regimen the same.  I would not like to make any changes at this time.  His blood pressure is very brittle and it will be dangerous if the BP goes too low.

## 2021-03-26 NOTE — Telephone Encounter (Signed)
Pt's wife is asking for a call to discuss how re: the change in the Sinemet pt's blood pressure is better but the tremors are much worse.  Wife states the tremors come and go but are much worse in the evening and sometimes at night.  Wife states when tremors are bad it makes it hard for an accurate blood pressure reading.  Please call.

## 2021-04-25 ENCOUNTER — Ambulatory Visit: Payer: Medicare HMO | Admitting: Neurology

## 2021-04-25 ENCOUNTER — Other Ambulatory Visit: Payer: Self-pay

## 2021-04-25 ENCOUNTER — Encounter: Payer: Self-pay | Admitting: Neurology

## 2021-04-25 VITALS — BP 141/74 | HR 51 | Ht 76.0 in | Wt 179.0 lb

## 2021-04-25 DIAGNOSIS — R498 Other voice and resonance disorders: Secondary | ICD-10-CM

## 2021-04-25 DIAGNOSIS — Z9181 History of falling: Secondary | ICD-10-CM | POA: Diagnosis not present

## 2021-04-25 DIAGNOSIS — G20 Parkinson's disease: Secondary | ICD-10-CM

## 2021-04-25 DIAGNOSIS — K5909 Other constipation: Secondary | ICD-10-CM | POA: Diagnosis not present

## 2021-04-25 DIAGNOSIS — R269 Unspecified abnormalities of gait and mobility: Secondary | ICD-10-CM

## 2021-04-25 MED ORDER — CARBIDOPA-LEVODOPA ER 50-200 MG PO TBCR: 1.0000 | EXTENDED_RELEASE_TABLET | Freq: Every day | ORAL | 3 refills | Status: DC

## 2021-04-25 MED ORDER — CARBIDOPA-LEVODOPA 25-100 MG PO TABS: 1.0000 | ORAL_TABLET | Freq: Every day | ORAL | 3 refills | Status: DC

## 2021-04-25 NOTE — Progress Notes (Signed)
Subjective:    Patient ID: William [name] is a 88 y.o. male.  HPI    Interim history:   William [name] is a 88 year old right-handed gentleman with an underlying complex medical history of right TIA in January 2003, SDH (s/p left craniotomy in August 2006), hypertension, hypothyroidism, thyroid cancer, partial onset seizures in the past (off Dilantin), lumbar spine disease (s/p L spine surgery at L4-5 in January 2014), renal tumor (s/p ablative surgery in March 2014), who presents for followup consultation of his right-sided predominant [name], complicated by physical decline, constipation, recurrent falls. He is accompanied by his GF/partner, William [name] again today. I last saw him on 12/21/2020, at which time he felt fairly stable.  He had a fall in May 2022 and no recent falls.  His blood pressure was fluctuating quite a bit, he had quite a few lower blood pressure values in the late mornings.  He was advised to reduce his 8 AM Sinemet dose from 2 pills to 1-1/2 pills, continue with 2 pills at 4 PM and 1-1/2 pills otherwise.  He was advised to talk to his cardiologist about his amlodipine and blood pressure management.  We had several phone interactions in the interim because of low blood pressure values primarily.  In December 2022 I advised him to reduce his Sinemet to 1 pill 5 times a day and continue with the CR at bedtime and Azilect once daily.  Today, 04/25/2021: He reports doing better.  Blood pressure has been more stable per William [name].  She feels that reducing the Sinemet dose has helped.  He has had more tremor intermittently but it is tolerable.  He has not had any recent falls other than his significant fall in October.  His appetite is good, she tries to encourage fluid intake.  He did not have to have any recent skin cancer surgery either.  Last biopsy in the distal left leg healed up quite well and more quickly than his biopsies before.  Had a very nice 89st birthday a couple days  ago.  He did go to the assisted living portion of the [facility] for rehab for about 3 weeks.  It was harder to get his medications on time at the time.  She reports that it was quite stressful for her.  They have help at the house, a gentleman, for about 4 hours each morning through home home instead.  Of note, he presented to the emergency room in [facility] on 02/09/2021 after a fall.  I reviewed the emergency room records.  He sustained a scalp laceration that required repair.  He had a head CT without contrast and cervical spine CT without contrast on 02/09/2021 and I reviewed the results: IMPRESSION:  Atrophy with small vessel chronic ischemic changes of deep cerebral  white matter.   No acute intracranial abnormalities.   Prior LEFT frontoparietal craniotomy.    IMPRESSION:  Multilevel degenerative disc and facet disease changes of the  cervical spine.   4.3 mm of retrolisthesis C5-C6, unchanged.   No acute cervical spine abnormalities.       The patient's allergies, current medications, family history, past medical history, past social history, past surgical history and problem list were reviewed and updated as appropriate.    Previously (copied from previous notes for reference):    I saw him on 07/04/2020, at which time we talked about his fall in the bathroom.  He had become lightheaded and fell through the glass shower door which shattered.  He did not get immediately checked out and they did not call 911.  He has had multiple Mohs surgeries through dermatology.  We talked about the importance of fall prevention and his risk for injuries.  He was advised to continue with his Parkinson's medications. He had interim emergency room visits.  On 08/30/2020 he presented to [facility] ED after a fall.  I reviewed the emergency room records.  He had a head CT and cervical spine CT without contrast on 08/30/2020 and I reviewed the results: IMPRESSION: CT of the head:  Chronic atrophic and ischemic changes without acute abnormality.   Changes of prior left craniotomy.   CT of the cervical spine: Multilevel degenerative change without acute abnormality.   He presented to the emergency room on 09/06/2020 after a fall.  He sustained a right hand skin tear.  I reviewed the emergency room records.  He was treated for small forehead lacerations as well as a skin tear on the right palm.   He had a head CT and cervical spine CT without contrast on 09/06/2020 and I reviewed the results: IMPRESSION: No acute abnormality head or cervical spine.   Atrophy and chronic microvascular ischemic change.   Cervical spondylosis.   William [name] called in the interim in July requesting a increase in the Sinemet.  She called in early August 2022 because of significant blood pressure fluctuations, particularly low values in the mornings.  I suggested we consider reducing his morning Sinemet dose back to 1-1/2 pills rather than 2 pills at 8 AM.       I saw him on 03/06/2020, at which time he reported feeling stable.  He had started Myrbetriq per urology which helped reduce his nighttime urination.  He did not have any recent falls.  He had recent Mohs procedures which limited his physical activity.  He had other procedures pending.  He was having reasonably good results with respect to constipation control with MiraLAX.  He was advised to continue with Azilect generic once daily, Sinemet IR and Sinemet CR on his prior regimen.       I saw him on 11/04/2019, at which time he reported a recent fall.  He did not sustain any major injuries thankfully, but did scrape his knee.  He did get checked out at [facility] ER and had CT scans of his head and neck which did not show any acute injuries thankfully.  He was advised to use his cane consistently and also consider using a walker for outside use for better stability.  I suggested we add Sinemet CR at bedtime to his regimen.       I saw him  on 07/05/2019 , At which time he reported doing fairly well.  She had noticed more forgetfulness.  We continued his medications at the current dose and timings, with the exception of increasing the 4 PM dose to 2 pills instead of 1-1/2 pills, in the hopes that he would have slightly better symptom control when they would go to the dining hall in the evening for their dinner.     I saw him on 03/03/19, at which time he reported feeling stable, he had a recent swallow evaluation at home.  He was taking Azilect once daily around lunchtime rather than first thing in the morning because of lower blood pressure values first thing in the morning.     I saw him on 11/04/18, at which time he reported having intermittent spells of feeling weaker.  He had some issues with his mobility.  They were settling in their condo, had moved in April 2020 to [place].  He requested physical, occupational and speech therapy through [facility] rehab and fitness at [facility].    In the interim, a swallow evaluation was recommended through his speech therapist.     I saw him on 05/07/2018, at which time he reported feeling fairly well and stable.  No recent falls reported.  I suggested we continue with Azilect once daily and Sinemet 1-1/2 pills 4 times a day, prescriptions typically are through the [place].     I saw him on 01/05/2018, at which time he reported a recent fall. He scraped his left elbow. He has had some right-sided knee pain and felt like his knee had given out. Otherwise, he was doing rather well. We mutually agreed to keep his medication regimen the same. She was advised to add some more strength training to his exercise regimen and stay well-hydrated.    I saw him on 09/02/2017, at which time he was quite stable motor-wise, he had some stressors including regarding William [name]' health and they were trying to consolidate their households with a plan to move into a retirement community condo by next year. He was  advised to continue with generic Azilect once daily and Sinemet generic 1-1/2 pills 4 times a day. He was encouraged to hydrate better with water.   I saw him on 05/05/2017, at which time he reported doing quite well, no recent falls or recent illness. They were planning to move into a retirement community by the end of this year. He was trying to exercise regularly. Blood pressure was stable. He had seen pulmonology for a lung nodule which was stable. I suggested we continue with his current medication regimen.   I saw him on 12/31/2016, at which time he reported intermittent lightheadedness. He had some blood pressure fluctuations with really high values at times. He was hospitalized in August 2018 for a non-STEMI. He had significant orthostatic hypotension during the office visit. He was advised to talk to his providers about reducing his blood pressure medication. He was advised to not continue with the boxing classes for [name] but exercise within his limitations at home. He was advised to try melatonin at night for sleep issues.    I saw him on 06/27/2016, at which time he reported doing well, he had a trip to [place] in [place] planned for springtime. He had no recent falls. He was supposed to be on a smaller dose of Eliquis, namely 2.5 mg twice a day. He continued to participate in the boxing class for [name] and felt that they were helpful.   I saw him on 03/05/2016, at which time he reported doing okay. He was during his stay active, was participating in [place] steady boxing, which he felt was helpful. He was avoiding strenuous parts of the exercise. He was interested in participating in a new drug study at [facility]. This was to investigate apomorphine sublingual to decrease off time. He was on Azilect once daily. He was taking Sinemet 4 times a day. He was trying to hydrate well, reported no recent falls. I suggested he increase the Sinemet to 1-1/2 pills 4 times a day.  I suggested he continue with generic Azilect once daily.   I saw him on 10/26/2015, at which time he reported doing quite well, had some stumbling episodes, no actual falls, was trying to drink enough water and  was still physically active. He had signed up for rock steady boxing classes but William [name] noted that he would get too exhausted.   I saw him on 06/22/2015, at which time he reported doing fairly well. He had finished outpatient OT, PT, and ST. Unfortunately, he did take a fall in the garage couple of months prior as he was coming in from the graduate, stepped backwards on the stairs and slipped off falling backwards, landed on his behind, head struck the car, he denied loss of consciousness or headache or bruise and no sequelae were reported. He was having rails installed in the garage entrance. Was still trying to stay active, playing golf. We mutually agreed to continue with Sinemet 4 times a day and Azilect once daily. We talked about fall risk and gait safety and fall precautions.   I saw him on 02/23/2015, at which time he reported doing fairly well. He was playing golf regularly. His right knee was bothering him. He had no recent falls. He was trying to hydrate well enough. He had no new issues with A. fib and no new complaints otherwise. I noted right knee swelling. He was advised to follow-up with his primary care physician for this. We mutually agreed to keep his Parkinson's medications the same.    I saw him on 11/01/2014 at which time he reported a recent diagnosis with A. fib. He was admitted to the hospital in April. I reviewed the hospital records from 07/17/2014 through 07/18/2014. He was started on Xarelto. He was then re-admitted on 08/09/14 to 08/10/14 due to altered mental status and slurring of speech and was suspected to have a TIA. He was seen by neuro in consultation and a baby aspirin was added to Xarelto. He presented back to the ER on 08/18/14. He a [place] wo contrast on  08/18/2014 ,  which was negative for any acute changes. He was seen by the Neurologist in the emergency room and it was felt that a full TIA workup was not necessary at the time. He had presented with hypertension and some slurring of speech. He was switched from Xarelto to Eliquis, because the Xarelto is not on formulary at the [place].  He endorsed recent stressors, including the recent passing of his younger brother a week prior with advanced Alzheimer's disease.    He had an MRI brain and MRA head on 08/09/14: MRI HEAD: No acute intracranial process, specifically no acute ischemia. Involutional changes. Mild white matter changes suggest chronic small vessel ischemic disease. MRA HEAD: No acute large vessel occlusion or high-grade stenosis. Mid grade stenosis RIGHT P2/3 segment.    I suggested we continue with Sinemet at 4 times a day. He was advised to continue with Azilect as well. He was advised to drink more water. In the interim, we restarted outpatient physical therapy, occupational therapy and speech therapy.   I saw him on 04/29/2014, at which time he reported doing well overall but he was more fatigued and had some excessive daytime somnolence. Memory was stable. His mood was stable. He noted some blood pressure fluctuations. Sometimes he had a dull headache and sometimes he felt lightheaded. He had some anxiety over his lady friend's health, as she had fallen and broken rib. He continued to walk regularly and played golf. He had no recent falls. I asked him to continue with his medications, Azilect once daily and Sinemet 4 times a day.   I saw him on 10/28/2013, at which time he reported doing well.  In particular, had no cognitive complaints, no mood issues, and continued to play golf 3 times a week. He was driving well. I suggested an increase in his levodopa to 1 pill 4 times a day of the 25-100 milligrams strength. [facility] and had seen a [place] neurologist as well.    I  saw him on 12/07/2012, at which time I felt that he was doing well on Azilect and levodopa. He called in December 2014 with problems with blood pressure fluctuations. He was wondering if this came from the [place] but I did not think it was due to the Azilect per se. I was reluctant to take him off of it.    I first met him on 06/03/2012 and he previously followed by Dr. Morene Antu. He has had primarily right-sided symptoms with regards to his Parkinson's, diagnosed in 2010 with symptoms dating back to late 2009 or early 2010. He had briefly tried Mirapex but was taken off d/t hypotension. L spine MRI in June 2013 showed renal cysts, degenerative joint disease most prominent at L4-5. He tried acupuncture. His MRI of the lumbar spine showed abnormalities with his kidney with a cyst and a tumor and he had ablative surgery on 07/10/12 and had a FU CT done. He had lower back surgery on 05/14/12.  I saw him back on 09/03/2012 and I suggested starting Azilect. He has been tolerating it well and both he and his girlfriend felt that he did better with it in terms of dexterity and fine motor control. He had some hypotension and lightheadedness and reduced his BP medication. He has been having some issue with gout.     His Past Medical History Is Significant For: Past Medical History:  Diagnosis Date   A-fib [facility])    Arthritis    "right knee" (08/09/2014)   Basal cell carcinoma    Benign prostatic hypertrophy with urinary obstruction    Chronic anticoagulation 08/17/6977   Complication of anesthesia    "difficulty intubation, had to use fiberoptic 2006"   Difficult intubation    DJD (degenerative joint disease)    Gout    Hx of colonic polyps    Hyperlipidemia    Hypertension    sees [name]   Hypothyroidism    Kidney tumor    "tumor on cyst on kidney"    Malignant neoplasm of thyroid gland ([place])    medullary carcinoma   Mini stroke    Parkinson disease ([place])    Pneumonia    hx of in 1952    Primary skin squamous cell carcinoma    Subdural hematoma 2006   TIA (transient ischemic attack)    pt does not recall this hx "but may have had one today" (08/09/2014)    His Past Surgical History Is Significant For: Past Surgical History:  Procedure Laterality Date   APPENDECTOMY     BACK SURGERY     CATARACT EXTRACTION W/ INTRAOCULAR LENS  IMPLANT, BILATERAL Bilateral ~ [place]     CRYOABLATION  07-10-12   "tumor on cyst on my kidney; had an ablation"   EYE SURGERY     INGUINAL HERNIA REPAIR Right    IR GENERIC HISTORICAL  11/09/2015   IR RADIOLOGIST EVAL & MGMT 11/09/2015 [name], MD [facility] INTERV RAD   IR RADIOLOGIST EVAL & MGMT  08/13/2016   KIDNEY SURGERY     KNEE ARTHROSCOPY Right    LEFT HEART  CATH AND CORONARY ANGIOGRAPHY N/A 12/05/2016   Procedure: LEFT HEART CATH AND CORONARY ANGIOGRAPHY;  Surgeon: [name], [name], MD;  Location: [place] [facility] LAB;  Service: Cardiovascular;  Laterality: N/A;   LUMBAR LAMINECTOMY/DECOMPRESSION MICRODISCECTOMY  05/14/2012   Procedure: LUMBAR LAMINECTOMY/DECOMPRESSION MICRODISCECTOMY 1 LEVEL;  Surgeon: [name], MD;  Location: [place] [facility];  Service: Neurosurgery;  Laterality: Left;  Left Lumbar four-five Laminectomy/Diskectomy/Far lateral diskectomy   SKIN CANCER EXCISION  "several"   "back of my neck; right eye; clavicle; nose"   SUBDURAL HEMATOMA EVACUATION VIA CRANIOTOMY  2006   THYROIDECTOMY     medullary carcinoma   TONSILLECTOMY      His Family History Is Significant For: Family History  Problem Relation Age of Onset   Stroke Mother    Stroke Father    Heart disease Father    Alzheimer's disease Sister    Alzheimer's disease Brother    [name] Neg Hx     His Social History Is Significant For: Social History   Socioeconomic History   Marital status: Widowed    Spouse name: Not on file   Number of children: 3   Years of education: 15   Highest education level: Not on file   Occupational History   Occupation: retired  Tobacco Use   Smoking status: Former    Packs/day: 0.80    Years: 15.00    Pack years: 12.00    Types: Cigarettes    Quit date: 04/15/1965    Years since quitting: 56.0   Smokeless tobacco: Never  Vaping Use   Vaping Use: Never used  Substance and Sexual Activity   Alcohol use: Yes    Alcohol/week: 3.0 - 4.0 standard drinks    Types: 3 - 4 Glasses of wine per week    Comment: 1 glass of wine occasionally (6oz glass)   Drug use: No   Sexual activity: Yes  Other Topics Concern   Not on file  Social History Narrative   Married.   Lives at home with significant other.   Retried. 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   Enjoys golfing, yard work, spending time with family.     Social Determinants of Health   Financial Resource Strain: Not on file  Food Insecurity: Not on file  Transportation Needs: Not on file  Physical Activity: Not on file  Stress: Not on file  Social Connections: Not on file    His Allergies Are:  No Known Allergies:   His Current Medications Are:  Outpatient Encounter Medications as of 04/25/2021  Medication Sig   amLODipine (NORVASC) 2.5 MG tablet TAKE 1 TABLET DAILY TO TAKEWITH AMLODIPINE 5MG FOR    TOTAL OF 7.5MG DAILY   amLODipine (NORVASC) 5 MG tablet TAKE 1 TABLET DAILY TO TAKEWITH 2.5MG TO MAKE 7.5MG   DAILY   apixaban (ELIQUIS) 2.5 MG TABS tablet Take 1 tablet (2.5 mg total) by mouth 2 (two) times daily.   Cyanocobalamin (VITAMIN B-12 IJ) Inject as directed.   hydrALAZINE (APRESOLINE) 10 MG tablet Take 10 mg by mouth as directed. Take 1 tablet by mouth up to three times daily for systolic blood pressure readings above 170.   levothyroxine (SYNTHROID) 100 MCG tablet TAKE 1 TABLET DAILY ON AN  EMPTY STOMACH WITH A FULL  GLASS OF WATER   losartan (COZAAR) 100 MG tablet TAKE 1 TABLET DAILY AT 2PM   mirabegron ER (MYRBETRIQ) 50 MG TB24 tablet Take 1 tablet by mouth daily.   nitroGLYCERIN (  NITROSTAT) 0.4 MG  SL tablet Place 1 tablet (0.4 mg total) under the tongue every 5 (five) minutes x 3 doses as needed for chest pain.   Probiotic Product (ALIGN) 4 MG CAPS Take 1 capsule by mouth daily.   rasagiline (AZILECT) 1 MG TABS tablet Take 1 tablet (1 mg total) by mouth daily.   simvastatin (ZOCOR) 40 MG tablet TAKE 1 TABLET AT BEDTIME   [DISCONTINUED] carbidopa-levodopa (SINEMET CR) 50-200 MG tablet Take 1 tablet by mouth at bedtime. (Patient taking differently: Take 1 tablet by mouth 2 (two) times daily. Pt takes 2 tablets in am 2 tablets at 4 . 1.5 at noon 1.5 8pm)   [DISCONTINUED] carbidopa-levodopa (SINEMET IR) 25-100 MG tablet Take 1.5 tablets three times a day at 8am, 12, and 8 PM. Take 2 tablets at 4pm. (Patient taking differently: Take by mouth. Take 1 tablet five times a day at 8am, 11 AM, 2 PM, 5 PM, and 8 PM.)   carbidopa-levodopa (SINEMET CR) 50-200 MG tablet Take 1 tablet by mouth at bedtime.   carbidopa-levodopa (SINEMET IR) 25-100 MG tablet Take 1 tablet by mouth 5 (five) times daily. At 8, 11, 2PM, 5PM and 8PM daily   No facility-administered encounter medications on file as of 04/25/2021.  :  Review of Systems:  Out of a complete 14 point review of systems, all are reviewed and negative with the exception of these symptoms as listed below:   Review of Systems  Neurological:        Patient here with significant other, William [name], for 4 month follow-up. Sinemet IR is now 1 tablet five times daily, 8 AM, 11 AM, 2 PM, 5 PM and 8 PM. Since med has been changed BP has been much better.   Objective:  Neurological Exam  Physical Exam Physical Examination:   Vitals:   04/25/21 1419  BP: (!) 141/74  Pulse: (!) 51    General Examination: The patient is a very pleasant 88 y.o. male in no acute distress. He appears mildly frail, well-groomed, good spirits.    HEENT: Normocephalic, pupils are equal, round and reactive to light, extraocular tracking shows moderate saccadic breakdown, moderate  speech difficulty noted including moderate dysarthria and hypophonia.  Hearing is mildly impaired, airway examination shows stable findings. Moderate to severe nuchal rigidity and decrease in passive range of motion. Mild to moderate mouth dryness noted.   Chest: Clear to auscultation without wheezing, rhonchi or crackles noted.   Heart: S1+S2+0, regular and normal without murmurs, rubs or gallops noted.    Abdomen: Soft, non-tender and non-distended.   Extremities: The distal lower extremities bilaterally show scarring from skin cancer surgeries or biopsies. Chronic mild bruising noted throughout upper and lower extremities.     Skin: Warm and dry without trophic changes noted. Mild bruising on hands, stable, chronic.    Musculoskeletal: exam reveals mild medial right knee swelling, could be some fluid collection.   Neurologically:  Mental status: The patient is awake, alert and oriented in all 4 spheres. His immediate and remote memory, attention, language skills and fund of knowledge are appropriate. There is no evidence of aphasia, agnosia, apraxia or anomia. Thought process is linear. Mood is normal and affect is normal.  William [name] provides most of his history. Cranial nerves II - XII are as described above under HEENT exam.  Motor exam: thin muscle bulk globally, global strength is about 4/5. He has increased tone in the right more than left upper and lower extremities. He has a  resting tremor in both upper extremities, R>L, more on the moderate side today.  Fine motor skills are moderately impaired on the right and slightly better on the left. He has no significant dyskinesias today.  He stands up with mild difficulty and pushes himself up. Posture is more stooped today.  He walks with decreased stride length and decrease pace, decreased arm swing bilaterally, using a rolling walker today.  Insecurity with turns and needs minimal assistance today.  Sensory exam is intact to light touch.  Cerebellar testing shows no additional concern for dysmetria or intention tremor or gait ataxia.   Assessment and Plan:    In summary, William [name] is a very pleasant 88 year old male with an underlying complex medical history of right TIA in January 2003, SDH (s/p left craniotomy in August 2006), hypertension, hypothyroidism, thyroid cancer, partial onset seizures in the past (off Dilantin), lumbar spine disease (s/p L spine surgery at L4-5 in January 2014), renal tumor (s/p ablative surgery in March 2014), who presents for followup consultation of his right-sided predominant [name], complicated by physical decline, constipation, recurrent falls, balance issues, sleep disturbance, mild memory issues and hypertension.  He has a remote history of dream enactment behavior.  He has had multiple skin cancer surgeries.  He has had more blood pressure fluctuations.  We increased his Sinemet morning dose to 2 pills recently but he may have had some blood pressure drop from it. Initially, we changed his Sinemet regimen to 1 pill 5 times a day starting at 8 AM.  He takes it 3 hourly.  He takes his Sinemet CR around bedtime which is around 10 PM.  She will wake him up to give him this medication if he has only fallen asleep.  He is advised to continue with his current medication regimen including Azilect and Sinemet CR once daily and Sinemet immediate release 1 pill 5 times a day. We again talked about the importance of fall prevention and maintaining a healthy lifestyle.  He is at high risk for injuries given his advanced age, advanced [name], prior history of subdural hematoma, being on a blood thinner. Adding Myrbetriq through urology has helped his nighttime urination. He has had multiple skin cancer surgeries.  He is advised to follow-up routinely in 4 months, sooner if needed.  I answered all the questions today and the patient and William [name] were in agreement. I spent 42 minutes in  total face-to-face time and in reviewing records during pre-charting, more than 50% of which was spent in counseling and coordination of care, reviewing test results, reviewing medications and treatment regimen and/or in discussing or reviewing the diagnosis of PD, the prognosis and treatment options. Pertinent laboratory and imaging test results that were available during this visit with the patient were reviewed by me and considered in my medical decision making (see chart for details).

## 2021-04-25 NOTE — Patient Instructions (Addendum)
It was nice to see you both again today.  I am very pleased to see that you have done better than with regards to your blood pressure stability.  Please continue with your immediate release carbidopa-levodopa 25-100 mg strength 1 tablet 5 times a day at 8 AM, 11 AM, 2 PM, 5 PM, and 8 PM daily.  Please continue with your long-acting carbidopa-levodopa 50-200 mg strength 1 pill at bedtime at 10 PM daily.  Please use your walker at all times for gait safety as you continue to be at fall risk.  Continue to monitor for constipation issues and use your MiraLAX as needed.  Try to hydrate well and eat nutritious food.  Please continue with physical therapy and speech therapy as planned.  I am glad you have found someone to help you in the mornings.

## 2021-05-07 ENCOUNTER — Telehealth: Payer: Self-pay | Admitting: Neurology

## 2021-05-07 NOTE — Telephone Encounter (Signed)
Okay to give the C/L every 4 hours.

## 2021-05-07 NOTE — Telephone Encounter (Signed)
Pts SO,  William [name] called she has been experimenting giving  CL 25/100 tabs 0800-then will be checking Bp if too low (98/75, 92/61) she has held the 1100 dose and given it later around 1230. He had falls (Friday and Saturday) did not hurt himself (but happened around 1100 time frame when she thought the Bp was lower and caused the falls) she was not able to check Bp then (as out of home).  But then Monday 1100 was 98/75 then gave 25/100 tablet 1230 Bp 180-85 then at 1345 was 134/77. She states that she feels like a trial of going to every 4 hours with his CL would help with his Bp and falls.  (As per above has experimented with giving the 1100 time frame dose later and he seemed to do better).  I relayed will see what Dr. Rexene Alberts says.  I relayed that when last seen he was doing well with the 1 tab every 3 hours.  She said that has changed now.  I asked if he is having sx for her to take his Bp and she says she does not want him to fall.

## 2021-05-07 NOTE — Telephone Encounter (Signed)
Spagnolo,[name] has called asking to be called about discussing how it is likely that pt getting his carbidopa-levodopa at 11:00 is too soon and how maybe it needs to be pushed out to 12 noon or later because of the drop it has in pt's blood pressure, please call.

## 2021-05-08 NOTE — Telephone Encounter (Signed)
I called pt and relayed that per Dr. Rexene Alberts the CL 25/100mg  doses can be made to every 4 hours  [id number] and then then 50/200mg   CR dose at bedtime.  She will try this and see how it goes and then if issues come up will let us know. Appreciated call back.

## 2021-05-10 ENCOUNTER — Telehealth: Payer: Self-pay | Admitting: Neurology

## 2021-05-10 NOTE — Telephone Encounter (Signed)
Noted. I have spoken with Tonia Ghent (?) at [facility] and canceled the refills on file for C/L CR 50-200 mg.

## 2021-05-10 NOTE — Telephone Encounter (Signed)
Pt's wife called wanting a correction done on ordering the pt's medications. She states that she received a message from [facility] stating that a refill request had been sent in for a medication from this office. Pt is not needing any refills at this time and when he does, he is needing all of his medication refill requests sent to the [facility] [place] Not [facility].

## 2021-06-19 ENCOUNTER — Telehealth: Payer: Self-pay | Admitting: Neurology

## 2021-06-19 NOTE — Telephone Encounter (Signed)
Pt's wife would like a call from [place], [place] to discuss the cause of the drop in pt blood pressure. ?

## 2021-06-20 NOTE — Telephone Encounter (Signed)
Called [name] back (Checked [name]) Gave Dr.Athars recommendation. Agnas expressed understanding and states patient is already doing that already.Did inform she can always call  patients PCP or go to ER if Patients BP has a major change. Agnas thanked me for calling her back  ?

## 2021-06-20 NOTE — Telephone Encounter (Signed)
I recommend that he stand up very slowly after eating and try to hydrate well.  Not much I can suggest otherwise for blood pressure dropping intermittently. ?I would not recommend Botox injections for drooling for him as he is at high risk for side effects. ?

## 2021-06-20 NOTE — Telephone Encounter (Signed)
Called William [name] back (check DPR) . William [name] stated she was his wife DPR has significant other.William [name] stated that patient is having some low BPs . Dr.Athar addressed in 03/2021 . [name] states BP Drops after patients eat . William [name] states BP is 94/59.95/57.William [name] also states that in Evening before he goes to bed BP will be 157/83. Marland Kitchen Per William [name] pt wants to discuss Botox for drooling. Will Forward message to Dr.Athar. William [name] thanked me for calling  ?

## 2021-08-02 ENCOUNTER — Telehealth: Payer: Self-pay | Admitting: Cardiology

## 2021-08-02 NOTE — Telephone Encounter (Signed)
Called patient's wife back about message. Patient's wife stated patient's BP is up and down and she thinks patient needs a different schedule on when to take his BP medications and might need some changes. They were going to wait to talk to Dr. Percival Spanish on Monday, but patient appointment got moved to May. Patient's wife stated that patient use to see HTN clinic. Made patient an appointment with HTN clinic tomorrow to see if pharmacist can help patient until their appointment with Dr. Percival Spanish. Patient's wife agreed to plan. ?

## 2021-08-02 NOTE — Telephone Encounter (Signed)
Pt c/o BP issue: STAT if pt c/o blurred vision, one-sided weakness or slurred speech ? ?1. What are your last 5 BP readings?  ?93/56 2pm ?105/58 after he ate ?120/71 this morning ?154/76 morning ?156/79 3pm. ? ?2. Are you having any other symptoms (ex. Dizziness, headache, blurred vision, passed out)? He tells her he has to sit down, but he hasn't verbally told her he is dizzy.  ? ?3. What is your BP issue? BP is low, William [name] has been holding his BP medication for a couple of days and that has kept his BP up.  She hasn't give him amLODipine since April 13th.  She didn't give him  losartan on the 17th, 18th  or today.  She states he drops his BP after he eats.  ?

## 2021-08-03 ENCOUNTER — Ambulatory Visit: Payer: Medicare HMO | Admitting: Pharmacist

## 2021-08-03 ENCOUNTER — Encounter: Payer: Self-pay | Admitting: Pharmacist

## 2021-08-03 VITALS — BP 143/73 | HR 48

## 2021-08-03 DIAGNOSIS — I10 Essential (primary) hypertension: Secondary | ICD-10-CM

## 2021-08-03 NOTE — Progress Notes (Signed)
Patient ID: William [name]                 DOB: Jul 24, 1928                      MRN: [id number] ? ? ? ? ?HPI: ?William [name] is a 88 y.o. male referred by Dr. Percival Spanish to HTN clinic. PMH is significant for HTN, TIA, PAF, NSTEMI, CHF, and Parkinsons.  Patient's girlfriend [name] contacted the triage line yesterday with concerns regarding patients fluctuating BP. ? ?Patient and [name] present today. [name] amublates with a walker and is a fall risk.  Spent 3 weeks in SNF after last fall on 02/09/21. ? ?[name] reports blood pressure has been dropping recently after eating.  She has been checking his blood pressure frequently and slowly reducing his medications due to hypotension.  He has not taken amlodipine in about a week and has not taken losartan in 2 days. ? ?Recent BP readings: ? ?4/18: 82/59, 106/70, 160/78 ?4/19: 154/76, 156/77, 151/75 ?4/20: 120/71, 105/58, 93/56, 145/79, 160/92 ? ?Has had issues with excessive drooling and PCP prescribed glycopyrrolate TID.  It was effective however after three doses he reports he felt "washed out" and discontinued. ? ?Has prescription for hydralazine '10mg'$  from SNF for systolic BP >409 but has not needed. ? ?Current HTN meds:  ? ?Losartan '100mg'$  daily ?Hydralazine '10mg'$  PRN for BP >170 ?Amlodipine 7.'5mg'$  daily ? ?Wt Readings from Last 3 Encounters:  ?04/25/21 179 lb (81.2 kg)  ?12/21/20 181 lb 6.4 oz (82.3 kg)  ?09/06/20 182 lb (82.6 kg)  ? ?BP Readings from Last 3 Encounters:  ?04/25/21 (!) 141/74  ?12/21/20 (!) 174/81  ?09/06/20 (!) 162/84  ? ?Pulse Readings from Last 3 Encounters:  ?04/25/21 (!) 51  ?12/21/20 (!) 57  ?09/06/20 (!) 54  ? ? ?Renal function: ?CrCl cannot be calculated (Patient's most recent lab result is older than the maximum 21 days allowed.). ? ?Past Medical History:  ?Diagnosis Date  ? A-fib ([place])   ? Arthritis   ? "right knee" (08/09/2014)  ? Basal cell carcinoma   ? Benign prostatic hypertrophy with urinary obstruction   ? Chronic anticoagulation 12/04/2016   ? Complication of anesthesia   ? "difficulty intubation, had to use fiberoptic 2006"  ? Difficult intubation   ? DJD (degenerative joint disease)   ? Gout   ? Hx of colonic polyps   ? Hyperlipidemia   ? Hypertension   ? sees [name]  ? Hypothyroidism   ? Kidney tumor   ? "tumor on cyst on kidney"   ? 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 Mini stroke   ? Parkinson disease ([place])   ? Pneumonia   ? hx of in 1952  ? Primary skin squamous cell carcinoma   ? Subdural hematoma 2006  ? TIA (transient ischemic attack)   ? pt does not recall this hx "but may have had one today" (08/09/2014)  ? ? ?Current Outpatient Medications on File Prior to Visit  ?Medication Sig Dispense Refill  ? amLODipine (NORVASC) 2.5 MG tablet TAKE 1 TABLET DAILY TO TAKEWITH AMLODIPINE '5MG'$  FOR    TOTAL OF 7.'5MG'$  DAILY 90 tablet 2  ? amLODipine (NORVASC) 5 MG tablet TAKE 1 TABLET DAILY TO TAKEWITH 2.'5MG'$  TO MAKE 7.'5MG'$    DAILY 90 tablet 2  ? apixaban (ELIQUIS) 2.5 MG TABS tablet Take 1 tablet (2.5 mg total) by mouth 2 (two) times daily.  60 tablet 4  ? carbidopa-levodopa (SINEMET CR) 50-200 MG tablet Take 1 tablet by mouth at bedtime. 90 tablet 3  ? carbidopa-levodopa (SINEMET IR) 25-100 MG tablet Take 1 tablet by mouth 5 (five) times daily. At 8, 11, 2PM, 5PM and 8PM daily 450 tablet 3  ? Cyanocobalamin (VITAMIN B-12 IJ) Inject as directed.    ? hydrALAZINE (APRESOLINE) 10 MG tablet Take 10 mg by mouth as directed. Take 1 tablet by mouth up to three times daily for systolic blood pressure readings above 170.    ? levothyroxine (SYNTHROID) 100 MCG tablet TAKE 1 TABLET DAILY ON AN  EMPTY STOMACH WITH A FULL  GLASS OF WATER 90 tablet 1  ? losartan (COZAAR) 100 MG tablet TAKE 1 TABLET DAILY AT 2PM 90 tablet 3  ? mirabegron ER (MYRBETRIQ) 50 MG TB24 tablet Take 1 tablet by mouth daily.    ? nitroGLYCERIN (NITROSTAT) 0.4 MG SL tablet Place 1 tablet (0.4 mg total) under the tongue every 5 (five) minutes x 3 doses as needed  for chest pain. 25 tablet 0  ? Probiotic Product (ALIGN) 4 MG CAPS Take 1 capsule by mouth daily.    ? rasagiline (AZILECT) 1 MG TABS tablet Take 1 tablet (1 mg total) by mouth daily. 90 tablet 3  ? simvastatin (ZOCOR) 40 MG tablet TAKE 1 TABLET AT BEDTIME 90 tablet 1  ? ?No current facility-administered medications on file prior to visit.  ? ? ?No Known Allergies ? ? ?Assessment/Plan: ? ?1. Hypertension -  Patient BP in room 143/73 and patient feels well.  Is having significant drops in BP during the day especially after meals and patient is a high fall risk.  Since blood pressure is fairly well controlled currently without amlodipine or losartan, recommended continuing to hold both medications at this time. Risk of hypotension greater than slightly elevated BP right now.  Advised [name] should check his BP if he is feeling disoriented, confused, dizzy or having other symptoms of hypotension. Check after eating and check before bedtime.  Can give hydralazine '10mg'$  if systolic BP >518.  Patient voiced understanding.  Has follow up with Dr Percival Spanish in 2 weeks. ? ?Hold amlodipine 7.'5mg'$   ?Hold losartan '100mg'$  ?Hold hydralazine '10mg'$  unless systolic BP >841 ? ?Karren Cobble, PharmD, BCACP, [place], CPP ?[address], [address] ?[place], [name], [address] ?Phone: [phone], Fax: [phone]  ?

## 2021-08-03 NOTE — Patient Instructions (Addendum)
It was good seeing you two today ? ?We will hold all your blood pressure medications for right now ? ?[name], take William [name]'s blood pressure if he is feeling symptomatic, after eating, and before bedtime ? ?If the top number is over 180, you can give him a hydralazine 10 mg tablet ? ?Please call with any questions ? ?Karren Cobble, PharmD, BCACP, [place], CPP ?[address], [address] ?[place], [name], [address] ?Phone: [phone], Fax: [phone]  ? ? ? ?

## 2021-08-06 ENCOUNTER — Ambulatory Visit: Payer: Medicare HMO | Admitting: Cardiology

## 2021-08-15 IMAGING — DX DG KNEE 1-2V*R*
1 series · 2 of 2 positions shown · non-contrast
Comparison: None.

CLINICAL DATA: Right knee pain after fall.

EXAM:
RIGHT KNEE - 1-2 VIEW

[Series 1: knee · 0.14mm/px · 2 of 2 slices shown]
[im 1/2]
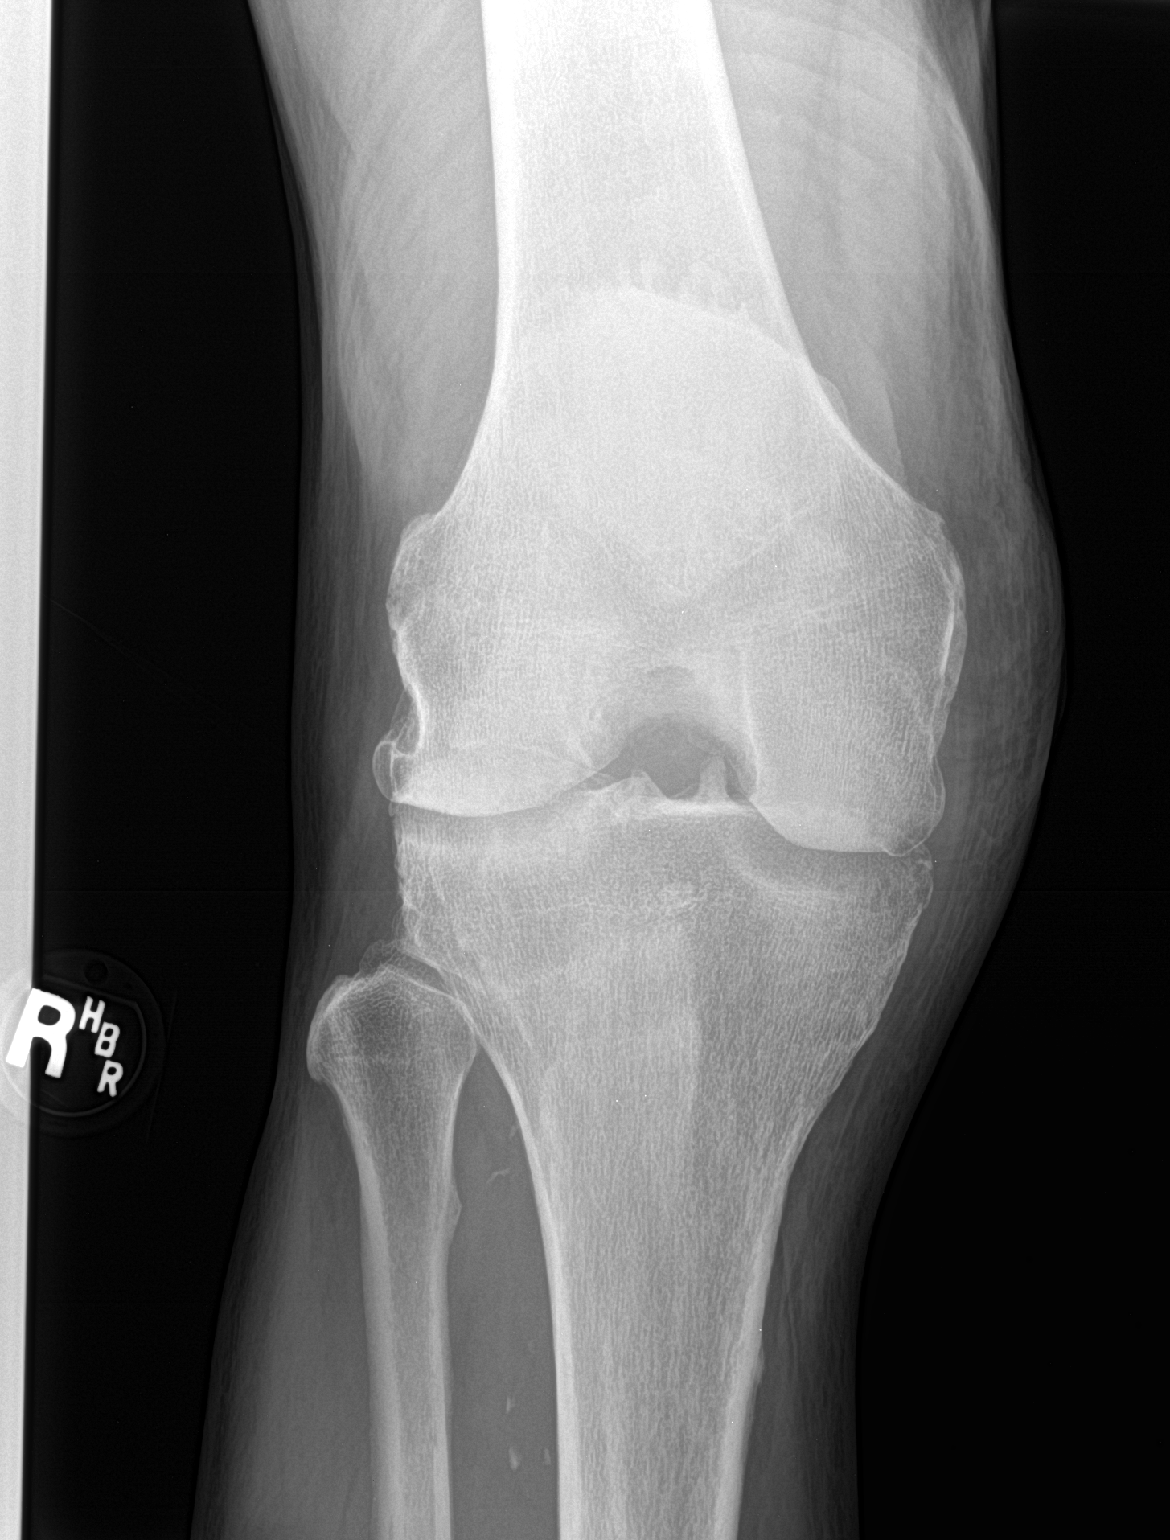
[im 2/2]
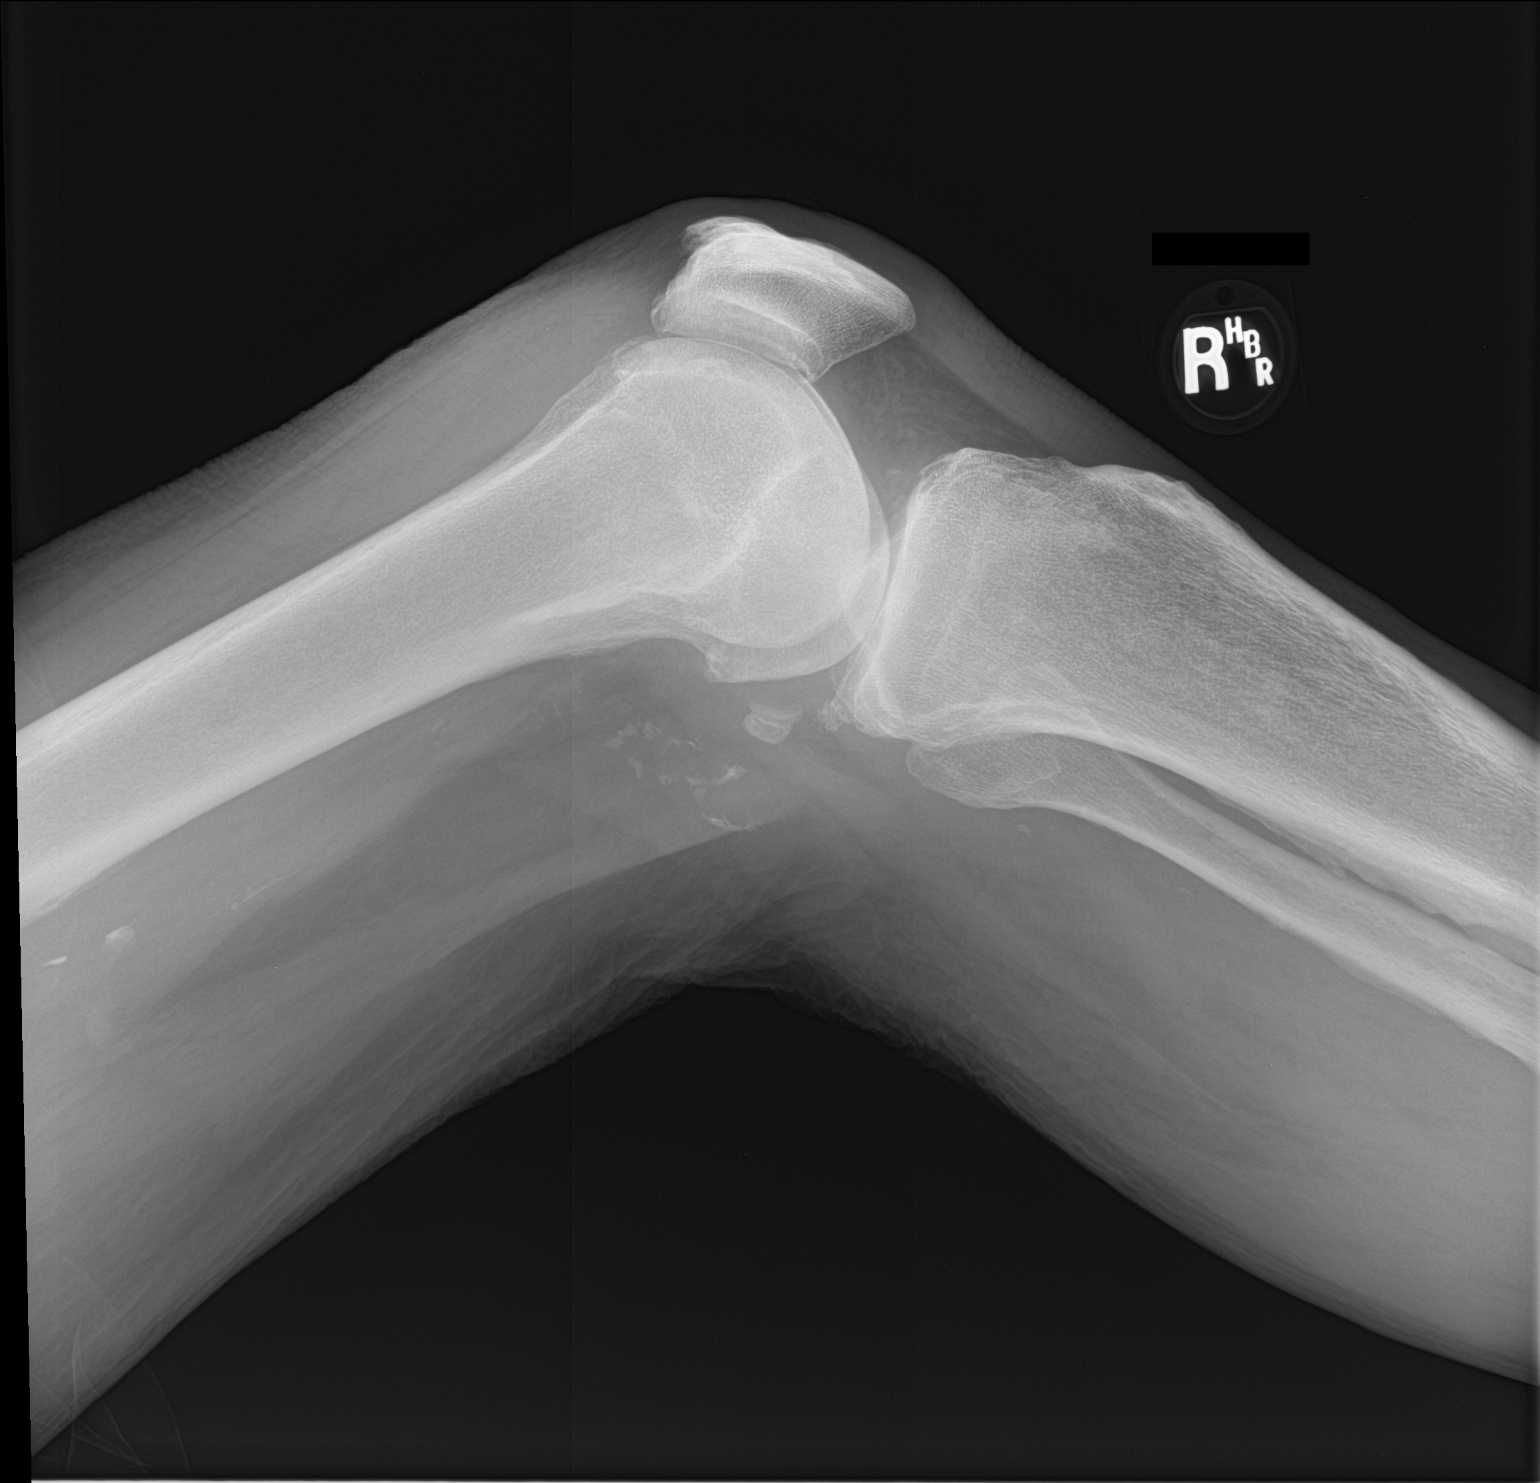

[2 of 2 positions shown; findings below may reference images not displayed]

FINDINGS: No evidence of fracture, dislocation, or joint effusion. Moderate
narrowing of the medial and lateral joint spaces are noted. Soft
tissues are unremarkable.
IMPRESSION: Moderate degenerative joint disease.  No acute abnormality seen.

## 2021-08-16 NOTE — Progress Notes (Signed)
?  ?Cardiology Office Note ? ? ?Date:  08/17/2021  ? ?ID:  William [name], DOB July 04, 1928, MRN [id number] ? ?PCP:  [place], [name]  ?Cardiologist:   Minus Breeding, MD ? ? ?Chief Complaint  ?Patient presents with  ? Hypertension  ? ? ?  ?History of Present Illness: ?William [name] is a 88 y.o. male who presents for evaluation of CAD.   Cardiac catheterization demonstrated 99% septal perforator and 50% LAD stenosis.  His ejection fraction was well preserved.  He did have an elevated EDP.  He was managed medically.   ? ?Since I last saw him he had had presyncope in the fall and apparent syncope as well.  He been having low blood pressures.  He did see our HTN Clinic Pharm D and last month his Norvasc and losartan were discontinued.  He had significant low blood pressures and he was very labile.  Since then he has not had as many low blood pressures but his blood pressure started creeping up and his wife's had to give him hydralazine 10 mg about 4 times in the last week.  I reviewed the blood pressure diary and indeed he has systolics sometimes in the 80s but also sometimes above 200.  He is done fairly well.  Has not had any further presyncope or syncope.  He is not noticing atrial fibrillation.  He is not having any chest pressure, neck or arm discomfort.  He has significant Parkinson's and gets around slowly with a walker. ? ? ?Past Medical History:  ?Diagnosis Date  ? A-fib ([place])   ? Arthritis   ? "right knee" (08/09/2014)  ? Basal cell carcinoma   ? Benign prostatic hypertrophy with urinary obstruction   ? Chronic anticoagulation 12/04/2016  ? Complication of anesthesia   ? "difficulty intubation, had to use fiberoptic 2006"  ? Difficult intubation   ? DJD (degenerative joint disease)   ? Gout   ? Hx of colonic polyps   ? Hyperlipidemia   ? Hypertension   ? sees [name]  ? Hypothyroidism   ? Kidney tumor   ? "tumor on cyst on kidney"   ? Malignant neoplasm of thyroid gland ([place])   ?  medullary carcinoma  ? Mini stroke   ? Parkinson disease ([place])   ? Pneumonia   ? hx of in 1952  ? Primary skin squamous cell carcinoma   ? Subdural hematoma ([place]) 2006  ? TIA (transient ischemic attack)   ? pt does not recall this hx "but may have had one today" (08/09/2014)  ? ? ?Past Surgical History:  ?Procedure Laterality Date  ? APPENDECTOMY    ? BACK SURGERY    ? CATARACT EXTRACTION W/ INTRAOCULAR LENS  IMPLANT, BILATERAL Bilateral ~ 1998  ? CHOLECYSTECTOMY    ? CRYOABLATION  07-10-12  ? "tumor on cyst on my kidney; had an ablation"  ? EYE SURGERY    ? INGUINAL HERNIA REPAIR Right   ? IR GENERIC HISTORICAL  11/09/2015  ? IR RADIOLOGIST EVAL & MGMT 11/09/2015 [name], MD [facility] INTERV RAD  ? IR RADIOLOGIST EVAL & MGMT  08/13/2016  ? KIDNEY SURGERY    ? KNEE ARTHROSCOPY Right   ? LEFT HEART CATH AND CORONARY ANGIOGRAPHY N/A 12/05/2016  ? Procedure: LEFT HEART CATH AND CORONARY ANGIOGRAPHY;  Surgeon: [name], [name], MD;  Location: [facility];  Service: Cardiovascular;  Laterality: N/A;  ? LUMBAR LAMINECTOMY/DECOMPRESSION MICRODISCECTOMY  05/14/2012  ? Procedure:  LUMBAR LAMINECTOMY/DECOMPRESSION MICRODISCECTOMY 1 LEVEL;  Surgeon: [name], MD;  Location: [facility];  Service: Neurosurgery;  Laterality: Left;  Left Lumbar four-five Laminectomy/Diskectomy/Far lateral diskectomy  ? SKIN CANCER EXCISION  "several"  ? "back of my neck; right eye; clavicle; nose"  ? SUBDURAL HEMATOMA EVACUATION VIA CRANIOTOMY  2006  ? THYROIDECTOMY    ? medullary carcinoma  ? TONSILLECTOMY    ? ? ? ?Current Outpatient Medications  ?Medication Sig Dispense Refill  ? amLODipine (NORVASC) 2.5 MG tablet Take 2.5 mg every afternoon at 3:00 pm 90 tablet 3  ? apixaban (ELIQUIS) 2.5 MG TABS tablet Take 1 tablet (2.5 mg total) by mouth 2 (two) times daily. 60 tablet 4  ? carbidopa-levodopa (SINEMET CR) 50-200 MG tablet Take 1 tablet by mouth at bedtime. 90 tablet 3  ? carbidopa-levodopa (SINEMET IR) 25-100 MG tablet Take 1 tablet  by mouth 5 (five) times daily. At 8, 11, 2PM, 5PM and 8PM daily 450 tablet 3  ? glycopyrrolate (ROBINUL) 1 MG tablet Take 1 mg by mouth 3 (three) times daily as needed.    ? hydrALAZINE (APRESOLINE) 10 MG tablet Take 10 mg by mouth as directed. Take 1 tablet by mouth up to three times daily for systolic blood pressure readings above 180.    ? levothyroxine (SYNTHROID) 100 MCG tablet TAKE 1 TABLET DAILY ON AN  EMPTY STOMACH WITH A FULL  GLASS OF WATER 90 tablet 1  ? mirabegron ER (MYRBETRIQ) 50 MG TB24 tablet Take 1 tablet by mouth daily.    ? nitroGLYCERIN (NITROSTAT) 0.4 MG SL tablet Place 1 tablet (0.4 mg total) under the tongue every 5 (five) minutes x 3 doses as needed for chest pain. 25 tablet 0  ? Probiotic Product (ALIGN) 4 MG CAPS Take 1 capsule by mouth daily.    ? rasagiline (AZILECT) 1 MG TABS tablet Take 1 tablet (1 mg total) by mouth daily. 90 tablet 3  ? simvastatin (ZOCOR) 40 MG tablet TAKE 1 TABLET AT BEDTIME 90 tablet 1  ? ?No current facility-administered medications for this visit.  ? ? ?Allergies:   Patient has no known allergies.  ? ? ?ROS:  Please see the history of present illness.   Otherwise, review of systems are positive for none.   All other systems are reviewed and negative.  ? ? ?PHYSICAL EXAM: ?VS:  BP (!) 108/56   Pulse (!) 57   Ht '6\' 3"'$  (1.905 m)   Wt 177 lb (80.3 kg)   BMI 22.12 kg/m?  , BMI Body mass index is 22.12 kg/m?. ?GENERAL: Somewhat frail appearing but quite happy ?NECK:  No jugular venous distention, waveform within normal limits, carotid upstroke brisk and symmetric, no bruits, no thyromegaly ?LUNGS:  Clear to auscultation bilaterally ?CHEST:  Unremarkable ?HEART:  PMI not displaced or sustained,S1 and S2 within normal limits, no S3, no S4, no clicks, no rubs, no murmurs ?ABD:  Flat, positive bowel sounds normal in frequency in pitch, no bruits, no rebound, no guarding, no midline pulsatile mass, no hepatomegaly, no splenomegaly ?EXT:  2 plus pulses throughout, no  edema, no cyanosis no clubbing ? ? ?EKG:  EKG is  ordered today. ?The ekg ordered today demonstrates sinus rhythm, rate 57, right bundle branch block, no acute ST-T wave changes. ? ? ?Recent Labs: ?No results found for requested labs within last 8760 hours.  ? ? ?Lipid Panel ?   ?Component Value Date/Time  ? CHOL 124 01/19/2019 0957  ? TRIG 67.0 01/19/2019  1027  ? TRIG 101 03/25/2006 0935  ? HDL 49.00 01/19/2019 0957  ? CHOLHDL 3 01/19/2019 0957  ? VLDL 13.4 01/19/2019 0957  ? [place] 62 01/19/2019 0957  ? ?  ? ?Wt Readings from Last 3 Encounters:  ?08/17/21 177 lb (80.3 kg)  ?04/25/21 179 lb (81.2 kg)  ?12/21/20 181 lb 6.4 oz (82.3 kg)  ?  ? ? ?Other studies Reviewed: ?Additional studies/ records that were reviewed today include: Pharm D records. ?Review of the above records demonstrates:  Please see elsewhere in the note.   ? ? ?ASSESSMENT AND PLAN: ? ?ATRIAL FIB:  The patient  has a CHA2DS2 - VASc score of 5.     He is not having any symptomatic paroxysms.  He tolerates anticoagulation.  He is up-to-date with blood work.  No change in therapy ? ?THORACIC AORTIC ENLARGEMENT:    This was 4.4 cm on CT in Dec 2018.   We have decided to manage this conservatively without follow-up imaging ? ?HTN:  The blood pressure is labile but creeping up.  Today were going to start Norvasc 2.5 mg in the afternoon and they will let me know how this affects him and whether this smooth out his blood pressure is little bit. \ ?  ?CAD:   He is having no new symptoms.  No change in therapy.  ?  ?CHRONIC DIASTOLIC HF:   He seems to be euvolemic.  Continue the meds as listed.  ?  ?CKD II: Creatinine was 1.78 up from 1.45.  This is followed by his primary providers. ? ? ?Current medicines are reviewed at length with the patient today.  The patient does not have concerns regarding medicines. ? ?The following changes have been made:   None ? ?Labs/ tests ordered today include:   ? ?Orders Placed This Encounter  ?Procedures  ? EKG 12-Lead   ? ? ? ?Disposition:   FU with me in 2 months with APP ? ? ?Signed, ?Minus Breeding, MD  ?08/17/2021 12:32 PM    ?[facility] ? ? ? ?

## 2021-08-17 ENCOUNTER — Ambulatory Visit: Payer: Medicare HMO | Admitting: Cardiology

## 2021-08-17 ENCOUNTER — Encounter: Payer: Self-pay | Admitting: Cardiology

## 2021-08-17 VITALS — BP 108/56 | HR 57 | Ht 75.0 in | Wt 177.0 lb

## 2021-08-17 DIAGNOSIS — I480 Paroxysmal atrial fibrillation: Secondary | ICD-10-CM

## 2021-08-17 DIAGNOSIS — I2510 Atherosclerotic heart disease of native coronary artery without angina pectoris: Secondary | ICD-10-CM | POA: Diagnosis not present

## 2021-08-17 DIAGNOSIS — I5032 Chronic diastolic (congestive) heart failure: Secondary | ICD-10-CM

## 2021-08-17 MED ORDER — AMLODIPINE BESYLATE 2.5 MG PO TABS: ORAL_TABLET | ORAL | 3 refills | Status: DC

## 2021-08-17 NOTE — Patient Instructions (Signed)
Medication Instructions:  ?Start Amlodipine 2.5 mg every day at 3:00 pm ?Continue all other medications ?*If you need a refill on your cardiac medications before your next appointment, please call your pharmacy* ? ? ?Lab Work: ?None ordered ? ? ?Testing/Procedures: ?None ordered ? ? ?Follow-Up: ?At [facility], you and your health needs are our priority.  As part of our continuing mission to provide you with exceptional heart care, we have created designated Provider Care Teams.  These Care Teams include your primary Cardiologist (physician) and Advanced Practice Providers (APPs -  Physician Assistants and Nurse Practitioners) who all work together to provide you with the care you need, when you need it. ? ?We recommend signing up for the patient portal called "MyChart".  Sign up information is provided on this After Visit Summary.  MyChart is used to connect with patients for Virtual Visits (Telemedicine).  Patients are able to view lab/test results, encounter notes, upcoming appointments, etc.  Non-urgent messages can be sent to your provider as well.   ?To learn more about what you can do with MyChart, go to [email or web address].   ? ?Your next appointment:  2 months ?  ? ?The format for your next appointment: Office ? ? ?Provider:  Dr.Hochrein's PA ? ? ?Important Information About Sugar ? ? ? ? ? ? ?

## 2021-08-23 ENCOUNTER — Ambulatory Visit: Payer: Medicare HMO | Admitting: Neurology

## 2021-08-23 ENCOUNTER — Encounter: Payer: Self-pay | Admitting: Neurology

## 2021-08-23 VITALS — BP 148/79 | HR 61 | Ht 76.0 in | Wt 174.2 lb

## 2021-08-23 DIAGNOSIS — G20 Parkinson's disease: Secondary | ICD-10-CM

## 2021-08-23 DIAGNOSIS — R5381 Other malaise: Secondary | ICD-10-CM

## 2021-08-23 DIAGNOSIS — K5909 Other constipation: Secondary | ICD-10-CM | POA: Diagnosis not present

## 2021-08-23 DIAGNOSIS — K117 Disturbances of salivary secretion: Secondary | ICD-10-CM

## 2021-08-23 DIAGNOSIS — Z9181 History of falling: Secondary | ICD-10-CM

## 2021-08-23 NOTE — Patient Instructions (Signed)
It was nice to see you both today.  ?As discussed, we will stay the course with your Parkinson's medications.  ?Please use your walker at all times and stand up slowly. ?I don't recommend Botox injections for your drooling due to high risk for side effects.  ?Please follow up to see one of our nurse practitioners in 3-4 months.  ?

## 2021-08-23 NOTE — Progress Notes (Signed)
Subjective:  ?  ?Patient ID: William [name] is a 88 y.o. male. ? ?HPI ? ? ? ?Interim history:  ? ?William [name] is a 88 year old right-handed gentleman with an underlying complex medical history of right TIA in January 2003, SDH (s/p left craniotomy in August 2006), hypertension, hypothyroidism, thyroid cancer, partial onset seizures in the past (off Dilantin), lumbar spine disease (s/p L spine surgery at L4-5 in January 2014), renal tumor (s/p ablative surgery in March 2014), who presents for followup consultation of his right-sided predominant [name], complicated by physical decline, constipation, recurrent falls. He is accompanied by his GF/partner, William [name] again today. I last saw him on 04/25/2021, at which time he reported feeling better.  His blood pressure values were more stable.  He was on Sinemet 1 pill 5 times a day and Sinemet CR at bedtime.  He had gone to [facility] for about 3 weeks.  Follow him in October 2022 and sustained a scalp laceration that required repair.  He was advised to use his walker at all times.   ? ?Call the interim in March 2023 reporting that his blood pressure was dropping intermittently.  She inquired about Botox injections for drooling and I advised against it, due to high risk for side effects. ? ?Today, 08/23/2021: He reports feeling stable, blood pressure has been more stable, cardiologist recently added low-dose amlodipine after stopping his blood pressure medications for a while.  He has hydralazine as needed.  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  Drooling has become worse.  Primary care has prescribed Robinul up to 3 times a day but when he took it twice daily he felt bad including sleepiness.  He has not taken it regularly. ? ?The patient's allergies, current medications, family history, past medical history, past social history, past surgical history and problem  list were reviewed and updated as appropriate.  ?  ?Previously (copied from previous notes for reference):  ?  ?  ? ? ?I saw him on 12/21/2020, at which time he felt fairly stable.  He had a fall in May 2022 and no recent falls.  His blood pressure was fluctuating quite a bit, he had quite a few lower blood pressure values in the late mornings.  He was advised to reduce his 8 AM Sinemet dose from 2 pills to 1-1/2 pills, continue with 2 pills at 4 PM and 1-1/2 pills otherwise.  He was advised to talk to his cardiologist about his amlodipine and blood pressure management. ?  ?We had several phone interactions in the interim because of low blood pressure values primarily.  In December 2022 I advised him to reduce his Sinemet to 1 pill 5 times a day and continue with the CR at bedtime and Azilect once daily. ?  ?I saw him on 07/04/2020, at which time we talked about his fall in the bathroom.  He had become lightheaded and fell through the glass shower door which shattered.  He did not get immediately checked out and they did not call 911.  He has had multiple Mohs surgeries through dermatology.  We talked about the importance of fall prevention and his risk for injuries.  He was advised to continue with his Parkinson's medications. ?He had interim emergency room visits.  On 08/30/2020 he presented to [facility] ED after a fall.  I reviewed the emergency room records.  He had a head  CT and cervical spine CT without contrast on 08/30/2020 and I reviewed the results: IMPRESSION: ?CT of the head: Chronic atrophic and ischemic changes without acute ?abnormality. ?  ?Changes of prior left craniotomy. ?  ?CT of the cervical spine: Multilevel degenerative change without ?acute abnormality. ?  ?He presented to the emergency room on 09/06/2020 after a fall.  He sustained a right hand skin tear.  I reviewed the emergency room records.  He was treated for small forehead lacerations as well as a skin tear on the right palm. ?   ?He had a head CT and cervical spine CT without contrast on 09/06/2020 and I reviewed the results: IMPRESSION: ?No acute abnormality head or cervical spine. ?  ?Atrophy and chronic microvascular ischemic change. ?  ?Cervical spondylosis. ?  ?William [name] called in the interim in July requesting a increase in the Sinemet.  She called in early August 2022 because of significant blood pressure fluctuations, particularly low values in the mornings.  I suggested we consider reducing his morning Sinemet dose back to 1-1/2 pills rather than 2 pills at 8 AM. ?  ?  ?  ?I saw him on 03/06/2020, at which time he reported feeling stable.  He had started Myrbetriq per urology which helped reduce his nighttime urination.  He did not have any recent falls.  He had recent Mohs procedures which limited his physical activity.  He had other procedures pending.  He was having reasonably good results with respect to constipation control with MiraLAX.  He was advised to continue with Azilect generic once daily, Sinemet IR and Sinemet CR on his prior regimen. ?  ?  ?  ?I saw him on 11/04/2019, at which time he reported a recent fall.  He did not sustain any major injuries thankfully, but did scrape his knee.  He did get checked out at [facility] ER and had CT scans of his head and neck which did not show any acute injuries thankfully.  He was advised to use his cane consistently and also consider using a walker for outside use for better stability.  I suggested we add Sinemet CR at bedtime to his regimen. ?  ?  ?  ?I saw him on 07/05/2019 , At which time he reported doing fairly well.  She had noticed more forgetfulness.  We continued his medications at the current dose and timings, with the exception of increasing the 4 PM dose to 2 pills instead of 1-1/2 pills, in the hopes that he would have slightly better symptom control when they would go to the dining hall in the evening for their dinner. ?  ?  ?I saw him on 03/03/19, at which time he  reported feeling stable, he had a recent swallow evaluation at home.  He was taking Azilect once daily around lunchtime rather than first thing in the morning because of lower blood pressure values first thing in the morning. ?  ?  ?I saw him on 11/04/18, at which time he reported having intermittent spells of feeling weaker.  He had some issues with his mobility.  They were settling in their condo, had moved in April 2020 to [place].  He requested physical, occupational and speech therapy through [facility] rehab and fitness at [facility].  ?  ?In the interim, a swallow evaluation was recommended through his speech therapist. ?  ?  ?I saw him on 05/07/2018, at which time he reported feeling fairly well and stable.  No recent falls reported.  I suggested we continue with Azilect once daily and Sinemet 1-1/2 pills 4 times a day, prescriptions typically are through the [place].  ?   ?I saw him on 01/05/2018, at which time he reported a recent fall. He scraped his left elbow. He has had some right-sided knee pain and felt like his knee had given out. Otherwise, he was doing rather well. We mutually agreed to keep his medication regimen the same. She was advised to add some more strength training to his exercise regimen and stay well-hydrated. ?   ?I saw him on 09/02/2017, at which time he was quite stable motor-wise, he had some stressors including regarding William [name]' health and they were trying to consolidate their households with a plan to move into a retirement community condo by next year. He was advised to continue with generic Azilect once daily and Sinemet generic 1-1/2 pills 4 times a day. He was encouraged to hydrate better with water. ?  ?I saw him on 05/05/2017, at which time he reported doing quite well, no recent falls or recent illness. They were planning to move into a retirement community by the end of this year. He was trying to exercise regularly. Blood pressure was stable. He had seen pulmonology for a  lung nodule which was stable. I suggested we continue with his current medication regimen. ?  ?I saw him on 12/31/2016, at which time he reported intermittent lightheadedness. He had some blood pressure fluctuat

## 2021-09-11 ENCOUNTER — Telehealth: Payer: Self-pay | Admitting: Cardiology

## 2021-09-11 ENCOUNTER — Encounter: Payer: Self-pay | Admitting: Cardiology

## 2021-09-11 NOTE — Telephone Encounter (Signed)
Pt significant other called stating pt has been experiencing low blood pressure reading so she's held amlodipine for a couple days. Then she noticed it started to increase and resumed it on 5/28. However, pt's blood pressure dropped yesterday and caused pt to pass out. Wife is questioning if pt can stop amlodipine and use hydralazine as needed.  Wife sending mychart message with BP log  Will forward to MD for review

## 2021-09-11 NOTE — Telephone Encounter (Signed)
Wife updated and verbalized understanding.   Minus Breeding, MD  You 35 minutes ago (12:33 PM)   Yes.  Sounds like a good idea

## 2021-09-11 NOTE — Telephone Encounter (Signed)
Pt c/o BP issue: STAT if pt c/o blurred vision, one-sided weakness or slurred speech  1. What are your last 5 BP readings?  163/97 - Today 187/85 - Yesterday   2. Are you having any other symptoms (ex. Dizziness, headache, blurred vision, passed out)? Passed out - yesterday  3. What is your BP issue? Pt's significant other states that pt's BP has been up and down. She would like to know if they can hold the Amlodipine when BP is low and give Hydralazine instead. Please advise

## 2021-10-01 ENCOUNTER — Telehealth: Payer: Self-pay | Admitting: Neurology

## 2021-10-01 NOTE — Telephone Encounter (Signed)
Spoke to pts wife.  Has with intermittent hallucinations since lat Friday.  Seeing puppies, reaching for things that are not there.  These are sporadic (anytime during day).  Not as rested as he usually is.  Has some changes with in the last week. (Sons OOT, friends at home visiting).  Had increase of mybetriq to '50mg'$  but has spoke to urology and they decided to stop it.  Pt has not had hallucinations before.  Last seen 08-23-2021.

## 2021-10-01 NOTE — Telephone Encounter (Signed)
Pt's wife, Delfin Edis; started over the weekend having hallucinations, seeing things that are not there. mirabegron ER (MYRBETRIQ) 50 MG TB24 tablet is not helping him, notified the urologist in [facility] considering stopping the medication. Would like a call from the nurse.

## 2021-10-01 NOTE — Telephone Encounter (Signed)
Certain bladder medications can increase the risk for hallucinations.  If being off the Myrbetriq does not improve the hallucinations in the next day or so, he may need to get checked out by his primary care for a smoldering infection, electrolyte disturbance, dehydration, etc.  Please relay back to [place].

## 2021-10-01 NOTE — Telephone Encounter (Signed)
Spoke to [place] (checked DPR) Gave Dr.Athars recommendation. For patient . Informed [name] if symptoms worsen feel free to call 911 to have patient  transported to ED to have him  further evaluated . [name] expressed understanding and thanked me for calling back

## 2021-10-01 NOTE — Telephone Encounter (Signed)
Called and LMVM for pts wife that return calling about hallucinations.

## 2021-10-12 ENCOUNTER — Encounter: Payer: Self-pay | Admitting: Cardiology

## 2021-10-12 NOTE — Telephone Encounter (Signed)
error 

## 2021-10-14 NOTE — Progress Notes (Unsigned)
Cardiology Clinic Note   Patient Name: William [name] Date of Encounter: 10/17/2021  Primary Care Provider:  [place] Primary Cardiologist:  Minus Breeding, MD  Patient Profile    William [name] 88 year old male presents the clinic today for follow-up evaluation of his paroxysmal atrial fibrillation, coronary artery disease, and essential hypertension.  Past Medical History    Past Medical History:  Diagnosis Date   A-fib [facility])    Arthritis    "right knee" (08/09/2014)   Basal cell carcinoma    Benign prostatic hypertrophy with urinary obstruction    Chronic anticoagulation 5/78/4696   Complication of anesthesia    "difficulty intubation, had to use fiberoptic 2006"   Difficult intubation    DJD (degenerative joint disease)    Gout    Hx of colonic polyps    Hyperlipidemia    Hypertension    sees [name]   Hypothyroidism    Kidney tumor    "tumor on cyst on kidney"    Malignant neoplasm of thyroid gland ([place])    medullary carcinoma   Mini stroke    Parkinson disease ([place])    Pneumonia    hx of in 1952   Primary skin squamous cell carcinoma    Subdural hematoma ([place]) 2006   TIA (transient ischemic attack)    pt does not recall this hx "but may have had one today" (08/09/2014)   Past Surgical History:  Procedure Laterality Date   APPENDECTOMY     BACK SURGERY     CATARACT EXTRACTION W/ INTRAOCULAR LENS  IMPLANT, BILATERAL Bilateral ~ [place]     CRYOABLATION  07-10-12   "tumor on cyst on my kidney; had an ablation"   EYE SURGERY     INGUINAL HERNIA REPAIR Right    IR GENERIC HISTORICAL  11/09/2015   IR RADIOLOGIST EVAL & MGMT 11/09/2015 William Edouard, MD [facility] INTERV RAD   IR RADIOLOGIST EVAL & MGMT  08/13/2016   KIDNEY SURGERY     KNEE ARTHROSCOPY Right    LEFT HEART CATH AND CORONARY ANGIOGRAPHY N/A 12/05/2016   Procedure: LEFT HEART CATH AND CORONARY ANGIOGRAPHY;  Surgeon: [name], [name], MD;  Location: [facility];  Service: Cardiovascular;  Laterality: N/A;   LUMBAR LAMINECTOMY/DECOMPRESSION MICRODISCECTOMY  05/14/2012   Procedure: LUMBAR LAMINECTOMY/DECOMPRESSION MICRODISCECTOMY 1 LEVEL;  Surgeon: [name], MD;  Location: [facility] NEURO ORS;  Service: Neurosurgery;  Laterality: Left;  Left Lumbar four-five Laminectomy/Diskectomy/Far lateral diskectomy   SKIN CANCER EXCISION  "several"   "back of my neck; right eye; clavicle; nose"   SUBDURAL HEMATOMA EVACUATION VIA CRANIOTOMY  2006   THYROIDECTOMY     medullary carcinoma   TONSILLECTOMY      Allergies  No Known Allergies  History of Present Illness    William [name] has a PMH of essential hypertension, generalized atherosclerosis, TIA, paroxysmal atrial fibrillation, coronary artery disease status post NSTEMI 2952, diastolic CHF, [name], DJD, hyperlipidemia and CKD stage II.  He underwent cardiac catheterization which showed 99% septal perforator and 50% LAD stenosis.  His LVEF was well-preserved.  He was noted to have elevated EDP.  Medical management was recommended.  He was seen by Dr. Percival Spanish on 08/17/2021.  During that time he noted presyncope during the previous fall and syncope.  He noted low blood pressures.  He was seen by the hypertension pharmacy clinic.  During that time his Norvasc and losartan were discontinued.  His blood pressures have been very labile.  Since the discontinuation his blood pressures had not been as low.  However, when his blood pressure with creep up his wife would give hydralazine 10 mg.  She reported that she had given it 4 times in the previous week.  On review of his blood pressure log blood pressures in the 80s-200s were noted.  It was felt that he was doing fairly well.  He had not had any further episodes of presyncope or syncope.  He had not been aware of his atrial fibrillation.  He denied chest pain.  His Parkinson's had progressed and he was using a walker to get around  slowly.  He presents to the clinic today for follow-up evaluation and states he has progressed well with physical therapy occupational therapy and speech therapy.  He presents with his wife.  He has become much stronger and they are moving back to their apartment at [facility].  He will continue to receive occupational therapy, physical therapy, and speech therapy at home.  He has returned to doing all of his ADLs independently.  He continues to use a rolling walker for ambulation.  His wife is concerned that his hydralazine medication has been being given too frequently.  We reviewed the medication orders from [facility] and from our office.  I will rewrite medication orders for [facility].  We will have him continue his current physical activity, diet and plan follow-up for 4 to 6 months.     Today he denies chest pain, shortness of breath, lower extremity edema, fatigue, palpitations, melena, hematuria, hemoptysis, diaphoresis, weakness, presyncope, syncope, orthopnea, and PND.    Home Medications    Prior to Admission medications   Medication Sig Start Date End Date Taking? Authorizing Provider  apixaban (ELIQUIS) 2.5 MG TABS tablet Take 1 tablet (2.5 mg total) by mouth 2 (two) times daily. 05/15/17   Minus Breeding, MD  carbidopa-levodopa (SINEMET CR) 50-200 MG tablet Take 1 tablet by mouth at bedtime. 04/25/21   Star Age, MD  carbidopa-levodopa (SINEMET IR) 25-100 MG tablet Take 1 tablet by mouth 5 (five) times daily. At 8, 11, 2PM, 5PM and 8PM daily 04/25/21   Star Age, MD  glycopyrrolate (ROBINUL) 1 MG tablet Take 1 mg by mouth daily. 07/24/21   [provider]  hydrALAZINE (APRESOLINE) 10 MG tablet Take 10 mg by mouth as directed. Take 1 tablet by mouth up to three times daily for systolic blood pressure readings above 180.    [provider]  levothyroxine (SYNTHROID) 100 MCG tablet TAKE 1 TABLET DAILY ON AN  EMPTY STOMACH WITH A FULL  GLASS OF WATER 09/30/18    Pleas Koch, NP  mirabegron ER (MYRBETRIQ) 50 MG TB24 tablet Take 1 tablet by mouth daily. 07/18/20   [provider]  nitroGLYCERIN (NITROSTAT) 0.4 MG SL tablet Place 1 tablet (0.4 mg total) under the tongue every 5 (five) minutes x 3 doses as needed for chest pain. 01/19/19   Pleas Koch, NP  Probiotic Product (ALIGN) 4 MG CAPS Take 1 capsule by mouth daily.    [provider]  rasagiline (AZILECT) 1 MG TABS tablet Take 1 tablet (1 mg total) by mouth daily. 03/06/20   Star Age, MD  simvastatin (ZOCOR) 40 MG tablet TAKE 1 TABLET AT BEDTIME 09/14/18   Minus Breeding, MD    Family History    Family History  Problem Relation Age of Onset   Stroke Mother  Stroke Father    Heart disease Father    Alzheimer's disease Sister    Alzheimer's disease Brother    [name] Neg Hx    He indicated that his mother is deceased. He indicated that his father is deceased. He indicated that one of his two sisters is deceased. He indicated that one of his two brothers is deceased. He indicated that the status of his neg hx is unknown.  Social History    Social History   Socioeconomic History   Marital status: Widowed    Spouse name: Not on file   Number of children: 3   Years of education: 15   Highest education level: Not on file  Occupational History   Occupation: retired  Tobacco Use   Smoking status: Former    Packs/day: 0.80    Years: 15.00    Total pack years: 12.00    Types: Cigarettes    Quit date: 04/15/1965    Years since quitting: 56.5   Smokeless tobacco: Never  Vaping Use   Vaping Use: Never used  Substance and Sexual Activity   Alcohol use: Yes    Alcohol/week: 3.0 - 4.0 standard drinks of alcohol    Types: 3 - 4 Glasses of wine per week    Comment: 1 glass of wine occasionally (6oz glass)   Drug use: No   Sexual activity: Yes  Other Topics Concern   Not on file  Social History Narrative   Married.   Lives at home with  significant other.   Retried. 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   Enjoys golfing, yard work, spending time with family.     Social Determinants of Health   Financial Resource Strain: Not on file  Food Insecurity: Not on file  Transportation Needs: Not on file  Physical Activity: Not on file  Stress: Not on file  Social Connections: Not on file  Intimate Partner Violence: Not on file     Review of Systems    General:  No chills, fever, night sweats or weight changes.  Cardiovascular:  No chest pain, dyspnea on exertion, edema, orthopnea, palpitations, paroxysmal nocturnal dyspnea. Dermatological: No rash, lesions/masses Respiratory: No cough, dyspnea Urologic: No hematuria, dysuria Abdominal:   No nausea, vomiting, diarrhea, bright red blood per rectum, melena, or hematemesis Neurologic:  No visual changes, wkns, changes in mental status. All other systems reviewed and are otherwise negative except as noted above.  Physical Exam    VS:  BP 140/90 (BP Location: Left Arm)   Pulse (!) 48   Ht '6\' 3"'$  (1.905 m)   Wt 172 lb (78 kg)   SpO2 100%   BMI 21.50 kg/m  , BMI Body mass index is 21.5 kg/m. GEN: Well nourished, well developed, in no acute distress. HEENT: normal. Neck: Supple, no JVD, carotid bruits, or masses. Cardiac: RRR, no murmurs, rubs, or gallops. No clubbing, cyanosis, edema.  Radials/DP/PT 2+ and equal bilaterally.  Respiratory:  Respirations regular and unlabored, clear to auscultation bilaterally. GI: Soft, nontender, nondistended, BS + x 4. MS: no deformity or atrophy. Skin: warm and dry, no rash. Neuro:  Strength and sensation are intact. Psych: Normal affect.  Accessory Clinical Findings    Recent Labs: No results found for requested labs within last 365 days.   Recent Lipid Panel    Component Value Date/Time   CHOL 124 01/19/2019 0957   TRIG 67.0 01/19/2019 0957   TRIG 101 03/25/2006 0935   HDL 49.00 01/19/2019 0957  CHOLHDL 3 01/19/2019  0957   VLDL 13.4 01/19/2019 0957   LDLCALC 62 01/19/2019 0957    ECG personally reviewed by me today-none today.  Echocardiogram 12/04/2016 Study Conclusions   - Left ventricle: Apical cavity obliteration in systole Given    septal thickness can consider cardiac MRI to r/o non obstructive    hypertrophic cardiomyopathy    if clinically indicated. The cavity size was normal. Wall    thickness was increased in a pattern of severe LVH. Systolic    function was normal. The estimated ejection fraction was in the    range of 60% to 65%. Doppler parameters are consistent with both    elevated ventricular end-diastolic filling pressure and elevated    left atrial filling pressure.  - Aortic valve: There was mild regurgitation.  - Left atrium: The atrium was mildly dilated.  - Atrial septum: No defect or patent foramen ovale was identified.  Assessment & Plan   1.  Paroxysmal atrial fibrillation-heart rate today 48.  Much stronger with physical therapy.  Denies bleeding issues.  Reports compliance with apixaban. Continue apixaban Heart healthy low-sodium diet Increase physical activity as tolerated  Coronary artery disease-no recent episodes of arm neck back or chest discomfort. Continue simvastatin Heart healthy low-sodium diet-salty 6 given Increase physical activity as tolerated  Chronic diastolic CHF-no increased DOE.  Euvolemic.  Weight stable. Continue hydralazine as needed Heart healthy low-sodium diet-salty 6 given Increase physical activity as tolerated  Labile hypertension-BP today 140/90.  Previously had Norvasc and losartan discontinued due to presyncope and syncope.  Denies further episodes of dizziness, syncope.  Blood pressures well controlled at home. Continue hydralazine as needed for blood pressures greater than 180 up to 3 times per day.  (Provided written order for [facility].) Maintain p.o. hydration  Disposition: Follow-up with Dr. Percival Spanish or me in 4-6  months.   Jossie Ng. Audie Stayer NP-C    10/17/2021, 4:09 PM [facility] [address] Office [phone] Fax [phone]  Notice: This dictation was prepared with Dragon dictation along with smaller phrase technology. Any transcriptional errors that result from this process are unintentional and may not be corrected upon review.  I spent 14 minutes examining this patient, reviewing medications, and using patient centered shared decision making involving her cardiac care.  Prior to her visit I spent greater than 20 minutes reviewing her past medical history,  medications, and prior cardiac tests.

## 2021-10-17 ENCOUNTER — Ambulatory Visit: Payer: Medicare HMO | Admitting: General Practice

## 2021-10-17 ENCOUNTER — Encounter: Payer: Self-pay | Admitting: General Practice

## 2021-10-17 VITALS — BP 140/90 | HR 48 | Ht 75.0 in | Wt 172.0 lb

## 2021-10-17 DIAGNOSIS — G20 Parkinson's disease: Secondary | ICD-10-CM

## 2021-10-17 DIAGNOSIS — I2510 Atherosclerotic heart disease of native coronary artery without angina pectoris: Secondary | ICD-10-CM

## 2021-10-17 DIAGNOSIS — I480 Paroxysmal atrial fibrillation: Secondary | ICD-10-CM | POA: Diagnosis not present

## 2021-10-17 DIAGNOSIS — I10 Essential (primary) hypertension: Secondary | ICD-10-CM | POA: Diagnosis not present

## 2021-10-17 MED ORDER — CARBIDOPA-LEVODOPA 25-100 MG PO TABS: 1.0000 | ORAL_TABLET | Freq: Four times a day (QID) | ORAL | 3 refills | Status: DC

## 2021-10-17 NOTE — Patient Instructions (Signed)
Medication Instructions:  The current medical regimen is effective;  continue present plan and medications as directed. Please refer to the Current Medication list given to you today.   *If you need a refill on your cardiac medications before your next appointment, please call your pharmacy*  Lab Work:   Testing/Procedures:  NONE    NONE If you have labs (blood work) drawn today and your tests are completely normal, you will receive your results only by: [place] (if you have MyChart) OR  A paper copy in the mail If you have any lab test that is abnormal or we need to change your treatment, we will call you to review the results.  Special Instructions INCREASE PHYSICAL ACTIVITY-AS TOLERATED  MAKE SURE THAT YOU PARTICIPATE IN PHYSICAL THERAPY, OCCUPATIONAL THERAPY AND SPEECH THERAPY  Follow-Up: Your next appointment:  4-6 month(s) In Person with Minus Breeding, MD [place], FNP-C     At [facility], you and your health needs are our priority.  As part of our continuing mission to provide you with exceptional heart care, we have created designated Provider Care Teams.  These Care Teams include your primary Cardiologist (physician) and Advanced Practice Providers (APPs -  Physician Assistants and Nurse Practitioners) who all work together to provide you with the care you need, when you need it.  Important Information About [facility]

## 2021-10-18 ENCOUNTER — Telehealth: Payer: Self-pay | Admitting: Neurology

## 2021-10-18 DIAGNOSIS — G20 Parkinson's disease: Secondary | ICD-10-CM

## 2021-10-18 MED ORDER — CARBIDOPA-LEVODOPA ER 50-200 MG PO TBCR: 1.0000 | EXTENDED_RELEASE_TABLET | Freq: Every day | ORAL | 3 refills | Status: AC

## 2021-10-18 MED ORDER — CARBIDOPA-LEVODOPA 25-100 MG PO TABS: 1.0000 | ORAL_TABLET | Freq: Four times a day (QID) | ORAL | Status: DC

## 2021-10-18 MED ORDER — RASAGILINE MESYLATE 1 MG PO TABS: ORAL_TABLET | ORAL | 3 refills | Status: AC

## 2021-10-18 NOTE — Telephone Encounter (Signed)
Pt wife is calling and need a nurse to call her to clarify husband medication stated [facility] and [facility] need documation.

## 2021-10-18 NOTE — Telephone Encounter (Signed)
I called [name] back. She is asking for orders with specific time frame for pt's PD medications to administered. She reports the facility [facility]) has been inconsistent with pt's administration times.  She is asking for orders to be written stating pt should receive  Carb/levo IR at 8,12,4,and 8 pm  Carb/levo CR at 10 pm Rasagiline 1 mg at 6 pm  Will confirm with MD if ok to add to order and then will fax to [phone] if agreeable.

## 2021-10-18 NOTE — Telephone Encounter (Signed)
I have sent updated orders.

## 2021-10-18 NOTE — Addendum Note (Signed)
Addended by: Verlin Grills on: 10/18/2021 03:22 PM   Modules accepted: Orders

## 2021-10-18 NOTE — Telephone Encounter (Signed)
Okay to write orders for medication administration times as requested, but please advise [name] to keep in mind that facility administered medications typically have a "wiggle" room and timeframe that they can administer the doses in, since they have multiple patients to take care of.

## 2021-11-20 ENCOUNTER — Telehealth: Payer: Self-pay | Admitting: Neurology

## 2021-11-20 NOTE — Telephone Encounter (Signed)
Pt wife [name] is calling and said he is not sleeping at all only taking short naps. [name] said PT is still taking PT and doing very well. [name] said she would like to discuss some sleeping opinions with nurse.

## 2021-11-21 NOTE — Telephone Encounter (Signed)
Called and LMVM for pts SO to return call when able.

## 2021-11-21 NOTE — Telephone Encounter (Signed)
Pt wife [name] is requesting a return call back.

## 2021-11-21 NOTE — Telephone Encounter (Signed)
I called William [name] back and relayed that muscle relaxant Dr. Rexene Alberts would not recommend due to fall risk/sedation.  Melatonin 1-'3mg'$  take 1-2 hours prior bedtime (any tab, cap, chewable or spray option).  She verbalized she will try prn dosing.  Appreciated call back.

## 2021-11-21 NOTE — Telephone Encounter (Signed)
I would not recommend a muscle relaxer d/t his fall risk.  He can try Melatonin: take 1 mg to up to 3 mg, one to 2 hours before bedtime. It is over the counter and comes in pill form, chewable form and spray.

## 2021-11-21 NOTE — Telephone Encounter (Signed)
I called pts SO, [name].  Pt has been having some restless nights.  They are living in independent living at the [facility].  They have [facility] 4 hours daily (help put to bed then leave).  He has had several restless nights recently.  No idea cause.  He has been followed for UTI's on regular basis and just seen recently.  He has PT at the facility and it is working well.  She helps him at night if needed.  He has lift bed, uses wheelchair to be able to push up.  She found some old muscle relaxants and wondering if they may help to use as prn.  I relayed that any sedating medications make him a fall risk. I would be glad to ask and see what Dr. Rexene Alberts recommends then get back to her.  Seems like has a bedtime routine, no TV in bedroom.

## 2021-11-29 ENCOUNTER — Telehealth: Payer: Self-pay | Admitting: Neurology

## 2021-11-29 NOTE — Telephone Encounter (Signed)
Pt's caretaker called stating that she is needing to be advised on what can be done with the hallucinations that the pt has been having. She states that last night was the worse and that it went on all night and seem to be getting worse and worse.  Please advise.

## 2021-11-29 NOTE — Telephone Encounter (Signed)
They can try increasing the melatonin, he can take 10 mg at night for now.  Keep appointment with Janett Billow next week.

## 2021-11-29 NOTE — Telephone Encounter (Signed)
I called her back and relayed that per  Dr. Rexene Alberts that increasing the melatonin to '10mg'$  po nightly. She has not given him this before.  I relayed that he can take '5mg'$  nightly  (OTC) and see how he does,  may go to '10mg'$   if needed.  Keep appt next week.  She appreciated call back.

## 2021-11-29 NOTE — Telephone Encounter (Signed)
I called  [name] back.  They are in independent living.  Has seen NP and then most recent 8-/12-8/13 ben to ED.  R/O infection.  Has appt next week with Janett Billow NP , but she is asking if anything could be done for hallucinations (when he sleeps he is reaching out, talking in sleep) not sleeping at all.  I relayed that would be glad to ask, and see what Dr. Rexene Alberts says.  It may be that may want to see him prior to any additional medications.  He is taking azilect '1mg'$  at 1800, CL 25/'100mg'$  1 QID, CL 50/200 1 at 2200.  Please advise.

## 2021-12-06 ENCOUNTER — Other Ambulatory Visit: Payer: Self-pay | Admitting: Neurology

## 2021-12-06 ENCOUNTER — Ambulatory Visit: Payer: Medicare HMO | Admitting: Adult Health

## 2021-12-06 DIAGNOSIS — G20 Parkinson's disease: Secondary | ICD-10-CM

## 2021-12-10 ENCOUNTER — Telehealth: Payer: Self-pay | Admitting: Neurology

## 2021-12-10 NOTE — Telephone Encounter (Signed)
Pt's caretaker called wanting to know if their appt can be switched to a VV due to the pt not being able to be transferred any longer and she is not able to bring him due to a pinched nerve. She states that she only would like to discuss his hallucinations but besides that everything else is good besides his Parkinson's getting worse which she knows it was to be expected.  Please advise.

## 2021-12-10 NOTE — Progress Notes (Unsigned)
[facility] [address] [place]. [name] [address] [phone]       OFFICE FOLLOW UP NOTE  William [name] Date of Birth:  [birth year] Medical Record Number:  [id number]    Primary neurologist: Dr. Rexene [name] Reason for visit: [name]  Virtual Visit via Video Note  I connected with William [name] on 12/11/21 at  1:15 PM EDT by a video enabled telemedicine application and verified that I am speaking with the correct person using two identifiers.  Location: Patient: at home Provider: in office   I discussed the limitations of evaluation and management by telemedicine and the availability of in person appointments. The patient expressed understanding and agreed to proceed.    SUBJECTIVE:   CHIEF COMPLAINT:  Parkinson's follow-up   HPI:   Update 12/11/2021 JM: Patient returns for follow-up visit via William [name] video visit per wife request after prior visit with Dr. Rexene [name] 3 months ago for [name].  No changes made at that time.   During the interim, patient's wife called office back in June for concerns of hallucinations and felt possibly related to Mybetriq therefore this was discontinued.  He presented to ED the following day for continued hallucinations, fever and altered mental status and treated for UTI and discharged to SNF for short duration.  He was treated again for recurrent UTI and visualized by urology.  Reports hallucinations have since improved and has not had any in the past few weeks.  Recent urinalysis by urology negative for recurrent UTI.  He is currently on prophylactic antibiotics.  Dr. Rexene [name] recommended increasing melatonin as hallucinations occurred at night/while sleeping. Wife also reports acting out while dreaming but this has not reoccurred either over the past few weeks. Continues on Sinemet 1 pill 4 times daily, Sinemet CR at bedtime and Azilect once daily. Was working with PT but this was completed last week,  continues working with OT.  Wife reports increased right-sided weakness over the past 5 to 6 days.  He also continues to have low blood pressure tissues more so after breakfast.  He is followed by an NP at his independent living facility.       History Dr. Guadelupe [name] prior OV note on 08/23/2021 provided for reference purposes only [name] is a 88 year old right-handed gentleman with an underlying complex medical history of right TIA in January 2003, SDH (s/p left craniotomy in August 2006), hypertension, hypothyroidism, thyroid cancer, partial onset seizures in the past (off Dilantin), lumbar spine disease (s/p L spine surgery at L4-5 in January 2014), renal tumor (s/p ablative surgery in March 2014), who presents for followup consultation of his right-sided predominant [name], complicated by physical decline, constipation, recurrent falls. He is accompanied by his GF/partner, William [name] again today. I last saw him on 04/25/2021, at which time he reported feeling better.  His blood pressure values were more stable.  He was on Sinemet 1 pill 5 times a day and Sinemet CR at bedtime.  He had gone to [facility] for about 3 weeks.  Follow him in October 2022 and sustained a scalp laceration that required repair.  He was advised to use his walker at all times.     Call the interim in March 2023 reporting that his blood pressure was dropping intermittently.  She inquired about Botox injections for drooling and I advised against it, due to high risk for side effects.   Today, 08/23/2021: He reports feeling stable, blood pressure has been more stable, cardiologist  recently added low-dose amlodipine after stopping his blood pressure medications for a while.  He has hydralazine as needed.  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  Drooling has become worse.  Primary care has prescribed Robinul up to 3 times a day but  when he took it twice daily he felt bad including sleepiness.  He has not taken it regularly.   The patient's allergies, current medications, family history, past medical history, past social history, past surgical history and problem list were reviewed and updated as appropriate.    Previously (copied from previous notes for reference):          I saw him on 12/21/2020, at which time he felt fairly stable.  He had a fall in May 2022 and no recent falls.  His blood pressure was fluctuating quite a bit, he had quite a few lower blood pressure values in the late mornings.  He was advised to reduce his 8 AM Sinemet dose from 2 pills to 1-1/2 pills, continue with 2 pills at 4 PM and 1-1/2 pills otherwise.  He was advised to talk to his cardiologist about his amlodipine and blood pressure management.   We had several phone interactions in the interim because of low blood pressure values primarily.  In December 2022 I advised him to reduce his Sinemet to 1 pill 5 times a day and continue with the CR at bedtime and Azilect once daily.   I saw him on 07/04/2020, at which time we talked about his fall in the bathroom.  He had become lightheaded and fell through the glass shower door which shattered.  He did not get immediately checked out and they did not call 911.  He has had multiple Mohs surgeries through dermatology.  We talked about the importance of fall prevention and his risk for injuries.  He was advised to continue with his Parkinson's medications. He had interim emergency room visits.  On 08/30/2020 he presented to [facility] ED after a fall.  I reviewed the emergency room records.  He had a head CT and cervical spine CT without contrast on 08/30/2020 and I reviewed the results: IMPRESSION: CT of the head: Chronic atrophic and ischemic changes without acute abnormality.   Changes of prior left craniotomy.   CT of the cervical spine: Multilevel degenerative change without acute  abnormality.   He presented to the emergency room on 09/06/2020 after a fall.  He sustained a right hand skin tear.  I reviewed the emergency room records.  He was treated for small forehead lacerations as well as a skin tear on the right palm.   He had a head CT and cervical spine CT without contrast on 09/06/2020 and I reviewed the results: IMPRESSION: No acute abnormality head or cervical spine.   Atrophy and chronic microvascular ischemic change.   Cervical spondylosis.   William [name] called in the interim in July requesting a increase in the Sinemet.  She called in early August 2022 because of significant blood pressure fluctuations, particularly low values in the mornings.  I suggested we consider reducing his morning Sinemet dose back to 1-1/2 pills rather than 2 pills at 8 AM.       I saw him on 03/06/2020, at which time he reported feeling stable.  He had started Myrbetriq per urology which helped reduce his nighttime urination.  He did not have any recent falls.  He  had recent Mohs procedures which limited his physical activity.  He had other procedures pending.  He was having reasonably good results with respect to constipation control with MiraLAX.  He was advised to continue with Azilect generic once daily, Sinemet IR and Sinemet CR on his prior regimen.       I saw him on 11/04/2019, at which time he reported a recent fall.  He did not sustain any major injuries thankfully, but did scrape his knee.  He did get checked out at [facility] ER and had CT scans of his head and neck which did not show any acute injuries thankfully.  He was advised to use his cane consistently and also consider using a walker for outside use for better stability.  I suggested we add Sinemet CR at bedtime to his regimen.       I saw him on 07/05/2019 , At which time he reported doing fairly well.  She had noticed more forgetfulness.  We continued his medications at the current dose and timings, with the exception of  increasing the 4 PM dose to 2 pills instead of 1-1/2 pills, in the hopes that he would have slightly better symptom control when they would go to the dining hall in the evening for their dinner.     I saw him on 03/03/19, at which time he reported feeling stable, he had a recent swallow evaluation at home.  He was taking Azilect once daily around lunchtime rather than first thing in the morning because of lower blood pressure values first thing in the morning.     I saw him on 11/04/18, at which time he reported having intermittent spells of feeling weaker.  He had some issues with his mobility.  They were settling in their condo, had moved in April 2020 to [place].  He requested physical, occupational and speech therapy through [facility] rehab and fitness at [facility].    In the interim, a swallow evaluation was recommended through his speech therapist.     I saw him on 05/07/2018, at which time he reported feeling fairly well and stable.  No recent falls reported.  I suggested we continue with Azilect once daily and Sinemet 1-1/2 pills 4 times a day, prescriptions typically are through the [place].     I saw him on 01/05/2018, at which time he reported a recent fall. He scraped his left elbow. He has had some right-sided knee pain and felt like his knee had given out. Otherwise, he was doing rather well. We mutually agreed to keep his medication regimen the same. She was advised to add some more strength training to his exercise regimen and stay well-hydrated.    I saw him on 09/02/2017, at which time he was quite stable motor-wise, he had some stressors including regarding William [name]' health and they were trying to consolidate their households with a plan to move into a retirement community condo by next year. He was advised to continue with generic Azilect once daily and Sinemet generic 1-1/2 pills 4 times a day. He was encouraged to hydrate better with water.   I saw him on 05/05/2017, at which  time he reported doing quite well, no recent falls or recent illness. They were planning to move into a retirement community by the end of this year. He was trying to exercise regularly. Blood pressure was stable. He had seen pulmonology for a lung nodule which was stable. I suggested we continue with his current medication  regimen.   I saw him on 12/31/2016, at which time he reported intermittent lightheadedness. He had some blood pressure fluctuations with really high values at times. He was hospitalized in August 2018 for a non-STEMI. He had significant orthostatic hypotension during the office visit. He was advised to talk to his providers about reducing his blood pressure medication. He was advised to not continue with the boxing classes for [name] but exercise within his limitations at home. He was advised to try melatonin at night for sleep issues.    I saw him on 06/27/2016, at which time he reported doing well, he had a trip to [place] in [place] planned for springtime. He had no recent falls. He was supposed to be on a smaller dose of Eliquis, namely 2.5 mg twice a day. He continued to participate in the boxing class for [name] and felt that they were helpful.   I saw him on 03/05/2016, at which time he reported doing okay. He was during his stay active, was participating in [place] steady boxing, which he felt was helpful. He was avoiding strenuous parts of the exercise. He was interested in participating in a new drug study at [facility]. This was to investigate apomorphine sublingual to decrease off time. He was on Azilect once daily. He was taking Sinemet 4 times a day. He was trying to hydrate well, reported no recent falls. I suggested he increase the Sinemet to 1-1/2 pills 4 times a day. I suggested he continue with generic Azilect once daily.   I saw him on 10/26/2015, at which time he reported doing quite well, had some stumbling episodes, no actual falls, was  trying to drink enough water and was still physically active. He had signed up for rock steady boxing classes but William [name] noted that he would get too exhausted.   I saw him on 06/22/2015, at which time he reported doing fairly well. He had finished outpatient OT, PT, and ST. Unfortunately, he did take a fall in the garage couple of months prior as he was coming in from the graduate, stepped backwards on the stairs and slipped off falling backwards, landed on his behind, head struck the car, he denied loss of consciousness or headache or bruise and no sequelae were reported. He was having rails installed in the garage entrance. Was still trying to stay active, playing golf. We mutually agreed to continue with Sinemet 4 times a day and Azilect once daily. We talked about fall risk and gait safety and fall precautions.   I saw him on 02/23/2015, at which time he reported doing fairly well. He was playing golf regularly. His right knee was bothering him. He had no recent falls. He was trying to hydrate well enough. He had no new issues with A. fib and no new complaints otherwise. I noted right knee swelling. He was advised to follow-up with his primary care physician for this. We mutually agreed to keep his Parkinson's medications the same.    I saw him on 11/01/2014 at which time he reported a recent diagnosis with A. fib. He was admitted to the hospital in April. I reviewed the hospital records from 07/17/2014 through 07/18/2014. He was started on Xarelto. He was then re-admitted on 08/09/14 to 08/10/14 due to altered mental status and slurring of speech and was suspected to have a TIA. He was seen by neuro in consultation and a baby aspirin was added to Xarelto. He presented back to the ER on 08/18/14.  He a [place] wo contrast on  08/18/2014 , which was negative for any acute changes. He was seen by the Neurologist in the emergency room and it was felt that a full TIA workup was not necessary at the time. He had  presented with hypertension and some slurring of speech. He was switched from Xarelto to Eliquis, because the Xarelto is not on formulary at the [place].  He endorsed recent stressors, including the recent passing of his younger brother a week prior with advanced Alzheimer's disease.    He had an MRI brain and MRA head on 08/09/14: MRI HEAD: No acute intracranial process, specifically no acute ischemia. Involutional changes. Mild white matter changes suggest chronic small vessel ischemic disease. MRA HEAD: No acute large vessel occlusion or high-grade stenosis. Mid grade stenosis RIGHT P2/3 segment.    I suggested we continue with Sinemet at 4 times a day. He was advised to continue with Azilect as well. He was advised to drink more water. In the interim, we restarted outpatient physical therapy, occupational therapy and speech therapy.   I saw him on 04/29/2014, at which time he reported doing well overall but he was more fatigued and had some excessive daytime somnolence. Memory was stable. His mood was stable. He noted some blood pressure fluctuations. Sometimes he had a dull headache and sometimes he felt lightheaded. He had some anxiety over his lady friend's health, as she had fallen and broken rib. He continued to walk regularly and played golf. He had no recent falls. I asked him to continue with his medications, Azilect once daily and Sinemet 4 times a day.   I saw him on 10/28/2013, at which time he reported doing well. In particular, had no cognitive complaints, no mood issues, and continued to play golf 3 times a week. He was driving well. I suggested an increase in his levodopa to 1 pill 4 times a day of the 25-100 milligrams strength. [facility] and had seen a [place] neurologist as well.    I saw him on 12/07/2012, at which time I felt that he was doing well on Azilect and levodopa. He called in December 2014 with problems with blood pressure fluctuations. He was  wondering if this came from the [place] but I did not think it was due to the Azilect per se. I was reluctant to take him off of it.    I first met him on 06/03/2012 and he previously followed by Dr. Morene Antu. He has had primarily right-sided symptoms with regards to his Parkinson's, diagnosed in 2010 with symptoms dating back to late 2009 or early 2010. He had briefly tried Mirapex but was taken off d/t hypotension. L spine MRI in June 2013 showed renal cysts, degenerative joint disease most prominent at L4-5. He tried acupuncture. His MRI of the lumbar spine showed abnormalities with his kidney with a cyst and a tumor and he had ablative surgery on 07/10/12 and had a FU CT done. He had lower back surgery on 05/14/12.  I saw him back on 09/03/2012 and I suggested starting Azilect. He has been tolerating it well and both he and his girlfriend felt that he did better with it in terms of dexterity and fine motor control. He had some hypotension and lightheadedness and reduced his BP medication. He has been having some issue with gout.          ROS:   14 system review of systems performed  and negative with exception of those listed in HPI  PMH:  Past Medical History:  Diagnosis Date   A-fib [facility])    Arthritis    "right knee" (08/09/2014)   Basal cell carcinoma    Benign prostatic hypertrophy with urinary obstruction    Chronic anticoagulation 08/30/6158   Complication of anesthesia    "difficulty intubation, had to use fiberoptic 2006"   Difficult intubation    DJD (degenerative joint disease)    Gout    Hx of colonic polyps    Hyperlipidemia    Hypertension    sees [name]   Hypothyroidism    Kidney tumor    "tumor on cyst on kidney"    Malignant neoplasm of thyroid gland ([place])    medullary carcinoma   Mini stroke    Parkinson disease ([place])    Pneumonia    hx of in 1952   Primary skin squamous cell carcinoma    Subdural hematoma ([place]) 2006   TIA (transient ischemic  attack)    pt does not recall this hx "but may have had one today" (08/09/2014)    PSH:  Past Surgical History:  Procedure Laterality Date   APPENDECTOMY     BACK SURGERY     CATARACT EXTRACTION W/ INTRAOCULAR LENS  IMPLANT, BILATERAL Bilateral ~ [place]     CRYOABLATION  07-10-12   "tumor on cyst on my kidney; had an ablation"   EYE SURGERY     INGUINAL HERNIA REPAIR Right    IR GENERIC HISTORICAL  11/09/2015   IR RADIOLOGIST EVAL & MGMT 11/09/2015 [name], MD [facility] INTERV RAD   IR RADIOLOGIST EVAL & MGMT  08/13/2016   KIDNEY SURGERY     KNEE ARTHROSCOPY Right    LEFT HEART CATH AND CORONARY ANGIOGRAPHY N/A 12/05/2016   Procedure: LEFT HEART CATH AND CORONARY ANGIOGRAPHY;  Surgeon: [name], [name], MD;  Location: [facility] CV LAB;  Service: Cardiovascular;  Laterality: N/A;   LUMBAR LAMINECTOMY/DECOMPRESSION MICRODISCECTOMY  05/14/2012   Procedure: LUMBAR LAMINECTOMY/DECOMPRESSION MICRODISCECTOMY 1 LEVEL;  Surgeon: [name], MD;  Location: [place] NEURO ORS;  Service: Neurosurgery;  Laterality: Left;  Left Lumbar four-five Laminectomy/Diskectomy/Far lateral diskectomy   SKIN CANCER EXCISION  "several"   "back of my neck; right eye; clavicle; nose"   SUBDURAL HEMATOMA EVACUATION VIA CRANIOTOMY  2006   THYROIDECTOMY     medullary carcinoma   TONSILLECTOMY      Social History:  Social History   Socioeconomic History   Marital status: Widowed    Spouse name: Not on file   Number of children: 3   Years of education: 15   Highest education level: Not on file  Occupational History   Occupation: retired  Tobacco Use   Smoking status: Former    Packs/day: 0.80    Years: 15.00    Total pack years: 12.00    Types: Cigarettes    Quit date: 04/15/1965    Years since quitting: 56.6   Smokeless tobacco: Never  Vaping Use   Vaping Use: Never used  Substance and Sexual Activity   Alcohol use: Yes    Alcohol/week: 3.0 - 4.0 standard drinks of alcohol    Types:  3 - 4 Glasses of wine per week    Comment: 1 glass of wine occasionally (6oz glass)   Drug use: No   Sexual activity: Yes  Other Topics Concern   Not on file  Social History Narrative   Married.  Lives at home with significant other.   Retried. 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   Enjoys golfing, yard work, spending time with family.     Social Determinants of Health   Financial Resource Strain: Not on file  Food Insecurity: Not on file  Transportation Needs: Not on file  Physical Activity: Not on file  Stress: Not on file  Social Connections: Not on file  Intimate Partner Violence: Not on file    Family History:  Family History  Problem Relation Age of Onset   Stroke Mother    Stroke Father    Heart disease Father    Alzheimer's disease Sister    Alzheimer's disease Brother    [name] Neg Hx     Medications:   Current Outpatient Medications on File Prior to Visit  Medication Sig Dispense Refill   apixaban (ELIQUIS) 2.5 MG TABS tablet Take 1 tablet (2.5 mg total) by mouth 2 (two) times daily. 60 tablet 4   carbidopa-levodopa (SINEMET CR) 50-200 MG tablet Take 1 tablet by mouth at bedtime. Administer at 10 pm 30 tablet 3   carbidopa-levodopa (SINEMET IR) 25-100 MG tablet Take 1 tablet by mouth 4 (four) times daily. At 8,12PM, 4PM and 8PM daily     hydrALAZINE (APRESOLINE) 10 MG tablet Take 10 mg by mouth as directed. Take 1 tablet by mouth up to three times daily for systolic blood pressure readings above 180.     levothyroxine (SYNTHROID) 100 MCG tablet TAKE 1 TABLET DAILY ON AN  EMPTY STOMACH WITH A FULL  GLASS OF WATER 90 tablet 1   nitroGLYCERIN (NITROSTAT) 0.4 MG SL tablet Place 1 tablet (0.4 mg total) under the tongue every 5 (five) minutes x 3 doses as needed for chest pain. 25 tablet 0   Probiotic Product (ALIGN) 4 MG CAPS Take 1 capsule by mouth daily.     rasagiline (AZILECT) 1 MG TABS tablet 1 daily administer at 6 pm 90 tablet 3   simvastatin  (ZOCOR) 40 MG tablet TAKE 1 TABLET AT BEDTIME 90 tablet 1   No current facility-administered medications on file prior to visit.    Allergies:  No Known Allergies    OBJECTIVE:  Physical Exam  General: well developed, well nourished, pleasant elderly Caucasian male, seated, in no evident distress HEENT: head normocephalic and atraumatic.  Moderate to severe dysarthria and hypophonia.  Moderate masked facial expression.  Neurologic Exam Mental Status: Awake and fully alert. Oriented to place and time. Recent and remote memory, attention span, concentration and fund of knowledge mildly impaired.  History provided by wife.  Mood and affect appropriate.  Cranial Nerves: Extraocular movements full without nystagmus.  HOH bilaterally. Face, tongue, palate moves normally and symmetrically.   Motor: appears generalized weakness with R>L pronator drift in upper and lower extremities.  Resting tremor R>L upper extremities.  Fine motor skill moderately to severely impaired on right compared to left. Coordination: Finger-to-nose and heel-to-shin performed accurately bilaterally. Gait and Station: Deferred      ASSESSMENT/PLAN: CHASKA HAGGER is a 88 y.o. year old male    1.  Parkinson disease  Continue Sinemet 1 tab 4 times daily and Sinemet CR nightly  Continue Azilect 1 mg in the evening Suspect reported worsening right sided weakness more in setting of deconditioning and/or progression of PD but difficulty fully evaluating due to visit type. Will f/u with Dr. Rexene [name] to see if further evaluation is indicated at this time.  Possible REM sleep behaviors  which seemed to improve after initiating melatonin vs tx for UTI. Resolved at this time. Continue to monitor   Continue working with [facility] therapies  2.  Hallucinations  Suspect more in setting of UTI as no additional hallucinations since UTI treated  Continue to monitor for now  If should reoccur without evidence of UTI, there would then  be more of a concern related to PD progression and could trial medication management if hallucinations bothersome or threatening to patient      Follow up in 3-4 months or call earlier if needed   CC:  PCP: [place], [place]    I spent 36 minutes of face-to-face and non-face-to-face time with patient and wife via [place] video visit.  This included previsit chart review, lab review, study review, order entry, electronic health record documentation, patient education and wife and discussion regarding the above diagnoses, treatment plan and answered all other questions to patient and wife satisfaction   Frann Rider, [facility]  [facility] [address] [place], [place] [address]  Phone [phone] Fax [phone] Note: This document was prepared with digital dictation and possible smart phrase technology. Any transcriptional errors that result from this process are unintentional.    I reviewed the above note and documentation by the Nurse Practitioner and agree with the history, exam, assessment and plan as outlined above.  I would recommend going ahead with a head CT without contrast, if he has sudden one-sided weakness or numbness or tingling, he will have to go to the ER.  I was available for consultation. Star Age, MD, PhD [facility])

## 2021-12-11 ENCOUNTER — Telehealth: Payer: Medicare HMO | Admitting: Adult Health

## 2021-12-11 ENCOUNTER — Encounter: Payer: Self-pay | Admitting: Adult Health

## 2021-12-11 DIAGNOSIS — R443 Hallucinations, unspecified: Secondary | ICD-10-CM | POA: Diagnosis not present

## 2021-12-11 DIAGNOSIS — R531 Weakness: Secondary | ICD-10-CM

## 2021-12-11 DIAGNOSIS — G20 Parkinson's disease: Secondary | ICD-10-CM | POA: Diagnosis not present

## 2021-12-11 NOTE — Patient Instructions (Signed)
Continue current treatment plan  Please call if hallucinations start again without any evidence of UTI, may consider medication management if hallucinations are bothersome or threatening to patient  Acting out behaviors while sleeping possibly in setting of UTI vs progression of [name]. If these should reoccur, please let us know  Will follow up with Dr. Rexene Alberts regarding right sided weakness concerns - you will be updated if further evaluation or testing is needed but during the meantime, please continue working with therapies    Follow-up in 3 to 4 months or call earlier if needed

## 2021-12-11 NOTE — Telephone Encounter (Signed)
I called and informed pt's wife that the appointment will be changed to a VV.

## 2021-12-11 NOTE — Telephone Encounter (Signed)
That is fine to be transitioned to virtual visit.  Thank you.

## 2021-12-13 ENCOUNTER — Other Ambulatory Visit (HOSPITAL_COMMUNITY): Payer: Self-pay

## 2021-12-13 MED ORDER — CARBIDOPA-LEVODOPA 25-100 MG PO TABS
1.0000 | ORAL_TABLET | Freq: Four times a day (QID) | ORAL | 3 refills | Status: AC
  Filled 2021-12-13: qty 360, 90d supply, fill #0

## 2021-12-13 NOTE — Addendum Note (Signed)
Addended by: Frann Rider L on: 12/13/2021 07:20 AM   Modules accepted: Orders

## 2021-12-14 ENCOUNTER — Telehealth: Payer: Self-pay | Admitting: Adult Health

## 2021-12-14 NOTE — Telephone Encounter (Signed)
[employer] medicare sent to [facility] they obtain auth  

## 2021-12-21 ENCOUNTER — Other Ambulatory Visit (HOSPITAL_COMMUNITY): Payer: Self-pay

## 2022-01-07 ENCOUNTER — Encounter: Payer: Self-pay | Admitting: Adult Health

## 2022-01-07 NOTE — Telephone Encounter (Signed)
That is fine if he is doing okay on this dosing. Thank you.

## 2022-02-16 NOTE — Progress Notes (Signed)
Cardiology Office Note   Date:  02/18/2022   ID:  William [name], DOB 06-06-1928, MRN [id number]  PCP:  [place], [place]  Cardiologist:   Minus Breeding, MD   Chief Complaint  Patient presents with   Atrial Fibrillation      History of Present Illness: William [name] is a 88 y.o. male who presents for evaluation of CAD.   Cardiac catheterization demonstrated 99% septal perforator and 50% LAD stenosis.  His ejection fraction was well preserved.  He did have an elevated EDP.  He was managed medically.    Since I last saw him he is getting around only in his wheelchair.  They have 8 hours a day help in his independent living.  They are watching his blood pressure very carefully.  He has very labile blood pressures.  Rarely he has a systolic greater than 267 and has taken hydralazine only once because of this.  At other times his blood pressure systolic dropped to the 12W.  His family or caregiver sits him down and gives him some fluid.  He has had no falls or syncope.  He has had no new palpitations, chest discomfort, neck or arm discomfort or shortness of breath.    Past Medical History:  Diagnosis Date   A-fib [facility])    Arthritis    "right knee" (08/09/2014)   Basal cell carcinoma    Benign prostatic hypertrophy with urinary obstruction    Chronic anticoagulation 5/80/9983   Complication of anesthesia    "difficulty intubation, had to use fiberoptic 2006"   Difficult intubation    DJD (degenerative joint disease)    Gout    Hx of colonic polyps    Hyperlipidemia    Hypertension    sees 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   Mini stroke    Parkinson disease    Pneumonia    hx of in 1952   Primary skin squamous cell carcinoma    Subdural hematoma ([place]) 2006   TIA (transient ischemic attack)    pt does not recall this hx "but may have had  one today" (08/09/2014)    Past Surgical History:  Procedure Laterality Date   APPENDECTOMY     BACK SURGERY     CATARACT EXTRACTION W/ INTRAOCULAR LENS  IMPLANT, BILATERAL Bilateral ~ [place]     CRYOABLATION  07-10-12   "tumor on cyst on my kidney; had an ablation"   EYE SURGERY     INGUINAL HERNIA REPAIR Right    IR GENERIC HISTORICAL  11/09/2015   IR RADIOLOGIST EVAL & MGMT 11/09/2015 [name], MD [facility] INTERV RAD   IR RADIOLOGIST EVAL & MGMT  08/13/2016   KIDNEY SURGERY     KNEE ARTHROSCOPY Right    LEFT HEART CATH AND CORONARY ANGIOGRAPHY N/A 12/05/2016   Procedure: LEFT HEART CATH AND CORONARY ANGIOGRAPHY;  Surgeon: [name], [name], MD;  Location: [place] [facility];  Service: Cardiovascular;  Laterality: N/A;   LUMBAR LAMINECTOMY/DECOMPRESSION MICRODISCECTOMY  05/14/2012   Procedure: LUMBAR LAMINECTOMY/DECOMPRESSION MICRODISCECTOMY 1 LEVEL;  Surgeon: [name], MD;  Location: [place] [facility];  Service: Neurosurgery;  Laterality: Left;  Left Lumbar four-five Laminectomy/Diskectomy/Far lateral diskectomy   SKIN CANCER EXCISION  "several"   "back of my neck; right eye; clavicle;  nose"   SUBDURAL HEMATOMA EVACUATION VIA CRANIOTOMY  2006   THYROIDECTOMY     medullary carcinoma   TONSILLECTOMY       Current Outpatient Medications  Medication Sig Dispense Refill   apixaban (ELIQUIS) 2.5 MG TABS tablet Take 1 tablet (2.5 mg total) by mouth 2 (two) times daily. 60 tablet 4   carbidopa-levodopa (SINEMET CR) 50-200 MG tablet Take 1 tablet by mouth at bedtime. Administer at 10 pm 30 tablet 3   carbidopa-levodopa (SINEMET IR) 25-100 MG tablet Take 1 tablet by mouth 4 times daily. (At [place], 12PM, 4PM and 8PM daily) 360 tablet 3   levothyroxine (SYNTHROID) 100 MCG tablet TAKE 1 TABLET DAILY ON AN  EMPTY STOMACH WITH A FULL  GLASS OF WATER 90 tablet 1   nitrofurantoin (MACRODANTIN) 50 MG capsule Take 50 mg by mouth at bedtime.     nitroGLYCERIN (NITROSTAT) 0.4 MG SL  tablet Place 1 tablet (0.4 mg total) under the tongue every 5 (five) minutes x 3 doses as needed for chest pain. 25 tablet 0   Probiotic Product (ALIGN) 4 MG CAPS Take 1 capsule by mouth daily.     rasagiline (AZILECT) 1 MG TABS tablet 1 daily administer at 6 pm 90 tablet 3   simvastatin (ZOCOR) 40 MG tablet TAKE 1 TABLET AT BEDTIME 90 tablet 1   No current facility-administered medications for this visit.    Allergies:   Patient has no known allergies.    ROS:  Please see the history of present illness.   Otherwise, review of systems are positive for none.   All other systems are reviewed and negative.    PHYSICAL EXAM: VS:  BP 130/68 (BP Location: Left Arm, Patient Position: Sitting, Cuff Size: Normal)   Pulse (!) 53   Ht '6\' 3"'$  (1.905 m)   Wt 163 lb (73.9 kg)   BMI 20.37 kg/m  , BMI Body mass index is 20.37 kg/m. GEN:  No distress, frail NECK:  No jugular venous distention at 90 degrees, waveform within normal limits, carotid upstroke brisk and symmetric, no bruits, no thyromegaly LYMPHATICS:  No cervical adenopathy LUNGS:  Clear to auscultation bilaterally BACK:  No CVA tenderness CHEST:  Unremarkable HEART:  S1 and S2 within normal limits, no S3, no S4, no clicks, no rubs, no murmurs ABD:  Positive bowel sounds normal in frequency in pitch, no bruits, no rebound, no guarding, unable to assess midline mass or bruit with the patient seated. EXT:  2 plus pulses throughout, no edema, no cyanosis no clubbing SKIN:  No rashes no nodules NEURO:  Cranial nerves II through XII grossly intact, motor grossly intact throughout PSYCH:  Cognitively intact, oriented to person place and time  EKG:  EKG is ordered today. Regular rhythm, unable to fully evaluate secondary to baseline artifact but appears to be sinus bradycardia, right bundle  Recent Labs: No results found for requested labs within last 365 days.    Lipid Panel    Component Value Date/Time   CHOL 124 01/19/2019 0957    TRIG 67.0 01/19/2019 0957   TRIG 101 03/25/2006 0935   HDL 49.00 01/19/2019 0957   CHOLHDL 3 01/19/2019 0957   VLDL 13.4 01/19/2019 0957   LDLCALC 62 01/19/2019 0957      Wt Readings from Last 3 Encounters:  02/18/22 163 lb (73.9 kg)  10/17/21 172 lb (78 kg)  08/23/21 174 lb 3.2 oz (79 kg)      Other studies Reviewed: Additional studies/ records that  were reviewed today include: Labs Review of the above records demonstrates:  Please see elsewhere in the note.     ASSESSMENT AND PLAN:  ATRIAL FIB:  The patient  has a CHA2DS2 - VASc score of 5.    He tolerates anticoagulation.  No change in therapy.  THORACIC AORTIC ENLARGEMENT:    This was 4.4 cm on CT in Dec 2018.   We are going to manage this conservatively so no further imaging.   HTN:  The blood pressure is labile and managed well by his caregivers and his wife.  No change in therapy.  Continue with as needed hydralazine.   CAD:   The patient has no new sypmtoms.  No further cardiovascular testing is indicated.  We will continue with aggressive risk reduction and meds as listed.  CHRONIC DIASTOLIC HF:   He seems to be euvolemic.  No change in therapy.   CKD II: Creatinine was improved.  He will continue the meds as listed.    Current medicines are reviewed at length with the patient today.  The patient does not have concerns regarding medicines.  The following changes have been made:   None  Labs/ tests ordered today include:    Orders Placed This Encounter  Procedures   EKG 12-Lead     Disposition:   FU with me in 6 months   Signed, Minus Breeding, MD  02/18/2022 12:54 PM    [place]

## 2022-02-18 ENCOUNTER — Encounter: Payer: Self-pay | Admitting: Cardiology

## 2022-02-18 ENCOUNTER — Ambulatory Visit: Payer: Medicare HMO | Attending: Cardiology | Admitting: Cardiology

## 2022-02-18 VITALS — BP 130/68 | HR 53 | Ht 75.0 in | Wt 163.0 lb

## 2022-02-18 DIAGNOSIS — N182 Chronic kidney disease, stage 2 (mild): Secondary | ICD-10-CM

## 2022-02-18 DIAGNOSIS — I10 Essential (primary) hypertension: Secondary | ICD-10-CM

## 2022-02-18 DIAGNOSIS — I480 Paroxysmal atrial fibrillation: Secondary | ICD-10-CM | POA: Diagnosis not present

## 2022-02-18 NOTE — Patient Instructions (Signed)
Medication Instructions:  The current medical regimen is effective;  continue present plan and medications.  *If you need a refill on your cardiac medications before your next appointment, please call your pharmacy*   Follow-Up: At [facility], you and your health needs are our priority.  As part of our continuing mission to provide you with exceptional heart care, we have created designated Provider Care Teams.  These Care Teams include your primary Cardiologist (physician) and Advanced Practice Providers (APPs -  Physician Assistants and Nurse Practitioners) who all work together to provide you with the care you need, when you need it.  We recommend signing up for the patient portal called "MyChart".  Sign up information is provided on this After Visit Summary.  MyChart is used to connect with patients for Virtual Visits (Telemedicine).  Patients are able to view lab/test results, encounter notes, upcoming appointments, etc.  Non-urgent messages can be sent to your provider as well.   To learn more about what you can do with MyChart, go to [email or web address].    Your next appointment:   6 month(s)  The format for your next appointment:   In Person  Provider:   Minus Breeding, MD

## 2022-03-13 ENCOUNTER — Ambulatory Visit: Payer: Medicare HMO | Admitting: Neurology

## 2022-03-13 ENCOUNTER — Encounter: Payer: Self-pay | Admitting: Neurology

## 2022-03-13 VITALS — BP 126/72 | HR 54 | Ht 75.0 in | Wt 164.2 lb

## 2022-03-13 DIAGNOSIS — K117 Disturbances of salivary secretion: Secondary | ICD-10-CM

## 2022-03-13 DIAGNOSIS — G20B2 Parkinson's disease with dyskinesia, with fluctuations: Secondary | ICD-10-CM | POA: Diagnosis not present

## 2022-03-13 DIAGNOSIS — Z9181 History of falling: Secondary | ICD-10-CM | POA: Diagnosis not present

## 2022-03-13 DIAGNOSIS — R498 Other voice and resonance disorders: Secondary | ICD-10-CM | POA: Diagnosis not present

## 2022-03-13 NOTE — Progress Notes (Signed)
Subjective:    Patient ID: William [name] is a 88 y.o. male.  HPI    Interim history:   William [name] is a 88 year old right-handed gentleman with an underlying complex medical history of right TIA in January 2003, SDH (s/p left craniotomy in August 2006), hypertension, hypothyroidism, thyroid cancer, partial onset seizures in the past (off Dilantin), lumbar spine disease (s/p L spine surgery at L4-5 in January 2014), renal tumor (s/p ablative surgery in March 2014), who presents for followup consultation of his right-sided predominant [name], complicated by physical decline, constipation, recurrent falls. He is accompanied by his GF/partner, William [name] again today. I last saw him on 08/23/2021, at which time he reported feeling stable, blood pressure had been more stable, cardiologist recently added low-dose amlodipine after stopping his blood pressure medications for a while.  He had hydralazine as needed.  [name] kept a log of his blood pressure.  He saw Frann Rider, NP in the interim in a VV on 12/11/2021 at which time his significant other reported that he had more right-sided weakness.  He was kept on the same medication regimen for [name] and a head CT was ordered but he did not have it done.  Today, 03/13/2022: He reports very little.  William [name] reports that he is very fatigued, he had another doctor's appointment earlier today with a urologist.  He is on low-dose Macrodantin for prevention of UTIs.  He has had recurrent UTIs.  He has not actually fallen but has limited mobility now and cannot walk without assistance.  They have caretakers twice daily for about 4 hours each time.  His first caretaker comes at 9 AM and stays till 1 PM and the second caretaker comes at 4 PM and stays till 8 PM, he needs assistance with dressing and hygiene and at times feeding.  Medication regimen is going okay, he continues to take Azilect once daily, Sinemet CR at bedtime and Sinemet IR 4  times a day.  Blood pressure has been more or less stable, sometimes goes down after eating.  She make sure that he sits down after eating.  He is in a wheelchair today.  She reports that he is more sleepy during the day.   The patient's allergies, current medications, family history, past medical history, past social history, past surgical history and problem list were reviewed and updated as appropriate.    Previously (copied from previous notes for reference):    I saw him on 04/25/2021, at which time he reported feeling better.  His blood pressure values were more stable.  He was on Sinemet 1 pill 5 times a day and Sinemet CR at bedtime.  He had gone to [employer] for about 3 weeks.  Follow him in October 2022 and sustained a scalp laceration that required repair.  He was advised to use his walker at all times.     She called the interim in March 2023 reporting that his blood pressure was dropping intermittently.  She inquired about Botox injections for drooling and I advised against it, due to high risk for side effects.     I saw him on 12/21/2020, at which time he felt fairly stable.  He had a fall in May 2022 and no recent falls.  His blood pressure was fluctuating quite a bit, he had quite a few lower blood pressure values in the late mornings.  He was advised to reduce his 8 AM Sinemet dose from 2 pills to 1-1/2 pills,  continue with 2 pills at 4 PM and 1-1/2 pills otherwise.  He was advised to talk to his cardiologist about his amlodipine and blood pressure management.   We had several phone interactions in the interim because of low blood pressure values primarily.  In December 2022 I advised him to reduce his Sinemet to 1 pill 5 times a day and continue with the CR at bedtime and Azilect once daily.   I saw him on 07/04/2020, at which time we talked about his fall in the bathroom.  He had become lightheaded and fell through the glass shower door which shattered.  He did not get  immediately checked out and they did not call 911.  He has had multiple Mohs surgeries through dermatology.  We talked about the importance of fall prevention and his risk for injuries.  He was advised to continue with his Parkinson's medications. He had interim emergency room visits.  On 08/30/2020 he presented to [facility] ED after a fall.  I reviewed the emergency room records.  He had a head CT and cervical spine CT without contrast on 08/30/2020 and I reviewed the results: IMPRESSION: CT of the head: Chronic atrophic and ischemic changes without acute abnormality.   Changes of prior left craniotomy.   CT of the cervical spine: Multilevel degenerative change without acute abnormality.   He presented to the emergency room on 09/06/2020 after a fall.  He sustained a right hand skin tear.  I reviewed the emergency room records.  He was treated for small forehead lacerations as well as a skin tear on the right palm.   He had a head CT and cervical spine CT without contrast on 09/06/2020 and I reviewed the results: IMPRESSION: No acute abnormality head or cervical spine.   Atrophy and chronic microvascular ischemic change.   Cervical spondylosis.   William [name] called in the interim in July requesting a increase in the Sinemet.  She called in early August 2022 because of significant blood pressure fluctuations, particularly low values in the mornings.  I suggested we consider reducing his morning Sinemet dose back to 1-1/2 pills rather than 2 pills at 8 AM.       I saw him on 03/06/2020, at which time he reported feeling stable.  He had started Myrbetriq per urology which helped reduce his nighttime urination.  He did not have any recent falls.  He had recent Mohs procedures which limited his physical activity.  He had other procedures pending.  He was having reasonably good results with respect to constipation control with MiraLAX.  He was advised to continue with Azilect generic once daily,  Sinemet IR and Sinemet CR on his prior regimen.       I saw him on 11/04/2019, at which time he reported a recent fall.  He did not sustain any major injuries thankfully, but did scrape his knee.  He did get checked out at [facility] ER and had CT scans of his head and neck which did not show any acute injuries thankfully.  He was advised to use his cane consistently and also consider using a walker for outside use for better stability.  I suggested we add Sinemet CR at bedtime to his regimen.       I saw him on 07/05/2019 , At which time he reported doing fairly well.  She had noticed more forgetfulness.  We continued his medications at the current dose and timings, with the exception of increasing the 4  PM dose to 2 pills instead of 1-1/2 pills, in the hopes that he would have slightly better symptom control when they would go to the dining hall in the evening for their dinner.     I saw him on 03/03/19, at which time he reported feeling stable, he had a recent swallow evaluation at home.  He was taking Azilect once daily around lunchtime rather than first thing in the morning because of lower blood pressure values first thing in the morning.     I saw him on 11/04/18, at which time he reported having intermittent spells of feeling weaker.  He had some issues with his mobility.  They were settling in their condo, had moved in April 2020 to [place].  He requested physical, occupational and speech therapy through [facility] rehab and fitness at [facility].    In the interim, a swallow evaluation was recommended through his speech therapist.     I saw him on 05/07/2018, at which time he reported feeling fairly well and stable.  No recent falls reported.  I suggested we continue with Azilect once daily and Sinemet 1-1/2 pills 4 times a day, prescriptions typically are through the [place].     I saw him on 01/05/2018, at which time he reported a recent fall. He scraped his left elbow. He has had some  right-sided knee pain and felt like his knee had given out. Otherwise, he was doing rather well. We mutually agreed to keep his medication regimen the same. She was advised to add some more strength training to his exercise regimen and stay well-hydrated.    I saw him on 09/02/2017, at which time he was quite stable motor-wise, he had some stressors including regarding William [name]' health and they were trying to consolidate their households with a plan to move into a retirement community condo by next year. He was advised to continue with generic Azilect once daily and Sinemet generic 1-1/2 pills 4 times a day. He was encouraged to hydrate better with water.   I saw him on 05/05/2017, at which time he reported doing quite well, no recent falls or recent illness. They were planning to move into a retirement community by the end of this year. He was trying to exercise regularly. Blood pressure was stable. He had seen pulmonology for a lung nodule which was stable. I suggested we continue with his current medication regimen.   I saw him on 12/31/2016, at which time he reported intermittent lightheadedness. He had some blood pressure fluctuations with really high values at times. He was hospitalized in August 2018 for a non-STEMI. He had significant orthostatic hypotension during the office visit. He was advised to talk to his providers about reducing his blood pressure medication. He was advised to not continue with the boxing classes for [name] but exercise within his limitations at home. He was advised to try melatonin at night for sleep issues.    I saw him on 06/27/2016, at which time he reported doing well, he had a trip to [place] in [place] planned for springtime. He had no recent falls. He was supposed to be on a smaller dose of Eliquis, namely 2.5 mg twice a day. He continued to participate in the boxing class for [name] and felt that they were helpful.   I saw him on 03/05/2016,  at which time he reported doing okay. He was during his stay active, was participating in [place] steady boxing, which he felt was helpful.  He was avoiding strenuous parts of the exercise. He was interested in participating in a new drug study at [facility]. This was to investigate apomorphine sublingual to decrease off time. He was on Azilect once daily. He was taking Sinemet 4 times a day. He was trying to hydrate well, reported no recent falls. I suggested he increase the Sinemet to 1-1/2 pills 4 times a day. I suggested he continue with generic Azilect once daily.   I saw him on 10/26/2015, at which time he reported doing quite well, had some stumbling episodes, no actual falls, was trying to drink enough water and was still physically active. He had signed up for rock steady boxing classes but William [name] noted that he would get too exhausted.   I saw him on 06/22/2015, at which time he reported doing fairly well. He had finished outpatient OT, PT, and ST. Unfortunately, he did take a fall in the garage couple of months prior as he was coming in from the graduate, stepped backwards on the stairs and slipped off falling backwards, landed on his behind, head struck the car, he denied loss of consciousness or headache or bruise and no sequelae were reported. He was having rails installed in the garage entrance. Was still trying to stay active, playing golf. We mutually agreed to continue with Sinemet 4 times a day and Azilect once daily. We talked about fall risk and gait safety and fall precautions.   I saw him on 02/23/2015, at which time he reported doing fairly well. He was playing golf regularly. His right knee was bothering him. He had no recent falls. He was trying to hydrate well enough. He had no new issues with A. fib and no new complaints otherwise. I noted right knee swelling. He was advised to follow-up with his primary care physician for this. We mutually agreed to keep his Parkinson's medications the  same.    I saw him on 11/01/2014 at which time he reported a recent diagnosis with A. fib. He was admitted to the hospital in April. I reviewed the hospital records from 07/17/2014 through 07/18/2014. He was started on Xarelto. He was then re-admitted on 08/09/14 to 08/10/14 due to altered mental status and slurring of speech and was suspected to have a TIA. He was seen by neuro in consultation and a baby aspirin was added to Xarelto. He presented back to the ER on 08/18/14. He a [place] wo contrast on  08/18/2014 , which was negative for any acute changes. He was seen by the Neurologist in the emergency room and it was felt that a full TIA workup was not necessary at the time. He had presented with hypertension and some slurring of speech. He was switched from Xarelto to Eliquis, because the Xarelto is not on formulary at the [place].  He endorsed recent stressors, including the recent passing of his younger brother a week prior with advanced Alzheimer's disease.    He had an MRI brain and MRA head on 08/09/14: MRI HEAD: No acute intracranial process, specifically no acute ischemia. Involutional changes. Mild white matter changes suggest chronic small vessel ischemic disease. MRA HEAD: No acute large vessel occlusion or high-grade stenosis. Mid grade stenosis RIGHT P2/3 segment.    I suggested we continue with Sinemet at 4 times a day. He was advised to continue with Azilect as well. He was advised to drink more water. In the interim, we restarted outpatient physical therapy, occupational therapy and speech therapy.   I saw  him on 04/29/2014, at which time he reported doing well overall but he was more fatigued and had some excessive daytime somnolence. Memory was stable. His mood was stable. He noted some blood pressure fluctuations. Sometimes he had a dull headache and sometimes he felt lightheaded. He had some anxiety over his lady friend's health, as she had fallen and broken rib. He continued to walk regularly and  played golf. He had no recent falls. I asked him to continue with his medications, Azilect once daily and Sinemet 4 times a day.   I saw him on 10/28/2013, at which time he reported doing well. In particular, had no cognitive complaints, no mood issues, and continued to play golf 3 times a week. He was driving well. I suggested an increase in his levodopa to 1 pill 4 times a day of the 25-100 milligrams strength. [facility] and had seen a [place] neurologist as well.    I saw him on 12/07/2012, at which time I felt that he was doing well on Azilect and levodopa. He called in December 2014 with problems with blood pressure fluctuations. He was wondering if this came from the [place] but I did not think it was due to the Azilect per se. I was reluctant to take him off of it.    I first met him on 06/03/2012 and he previously followed by Dr. Morene Antu. He has had primarily right-sided symptoms with regards to his Parkinson's, diagnosed in 2010 with symptoms dating back to late 2009 or early 2010. He had briefly tried Mirapex but was taken off d/t hypotension. L spine MRI in June 2013 showed renal cysts, degenerative joint disease most prominent at L4-5. He tried acupuncture. His MRI of the lumbar spine showed abnormalities with his kidney with a cyst and a tumor and he had ablative surgery on 07/10/12 and had a FU CT done. He had lower back surgery on 05/14/12.  I saw him back on 09/03/2012 and I suggested starting Azilect. He has been tolerating it well and both he and his girlfriend felt that he did better with it in terms of dexterity and fine motor control. He had some hypotension and lightheadedness and reduced his BP medication. He has been having some issue with gout.     His Past Medical History Is Significant For: Past Medical History:  Diagnosis Date   A-fib [facility])    Arthritis    "right knee" (08/09/2014)   Basal cell carcinoma    Benign prostatic hypertrophy  with urinary obstruction    Chronic anticoagulation 9/70/2637   Complication of anesthesia    "difficulty intubation, had to use fiberoptic 2006"   Difficult intubation    DJD (degenerative joint disease)    Gout    Hx of colonic polyps    Hyperlipidemia    Hypertension    sees [name]   Hypothyroidism    Kidney tumor    "tumor on cyst on kidney"    Malignant neoplasm of thyroid gland ([place])    medullary carcinoma   Mini stroke    Parkinson disease    Pneumonia    hx of in 1952   Primary skin squamous cell carcinoma    Subdural hematoma ([place]) 2006   TIA (transient ischemic attack)    pt does not recall this hx "but may have had one today" (08/09/2014)    Her Past Surgical History Is Significant For: Past Surgical History:  Procedure  Laterality Date   APPENDECTOMY     BACK SURGERY     CATARACT EXTRACTION W/ INTRAOCULAR LENS  IMPLANT, BILATERAL Bilateral ~ 1998   CHOLECYSTECTOMY     CRYOABLATION  07-10-12   "tumor on cyst on my kidney; had an ablation"   EYE SURGERY     INGUINAL HERNIA REPAIR Right    IR GENERIC HISTORICAL  11/09/2015   IR RADIOLOGIST EVAL & MGMT 11/09/2015 [name], MD [facility] INTERV RAD   IR RADIOLOGIST EVAL & MGMT  08/13/2016   KIDNEY SURGERY     KNEE ARTHROSCOPY Right    LEFT HEART CATH AND CORONARY ANGIOGRAPHY N/A 12/05/2016   Procedure: LEFT HEART CATH AND CORONARY ANGIOGRAPHY;  Surgeon: [name], [name], MD;  Location: [facility];  Service: Cardiovascular;  Laterality: N/A;   LUMBAR LAMINECTOMY/DECOMPRESSION MICRODISCECTOMY  05/14/2012   Procedure: LUMBAR LAMINECTOMY/DECOMPRESSION MICRODISCECTOMY 1 LEVEL;  Surgeon: [name], MD;  Location: [facility];  Service: Neurosurgery;  Laterality: Left;  Left Lumbar four-five Laminectomy/Diskectomy/Far lateral diskectomy   SKIN CANCER EXCISION  "several"   "back of my neck; right eye; clavicle; nose"   SUBDURAL HEMATOMA EVACUATION VIA CRANIOTOMY  2006   THYROIDECTOMY     medullary  carcinoma   TONSILLECTOMY      His Family History Is Significant For: Family History  Problem Relation Age of Onset   Stroke Mother    Stroke Father    Heart disease Father    Alzheimer's disease Sister    Alzheimer's disease Brother    [name] Neg Hx     His Social History Is Significant For: Social History   Socioeconomic History   Marital status: Widowed    Spouse name: Not on file   Number of children: 3   Years of education: 15   Highest education level: Not on file  Occupational History   Occupation: retired  Tobacco Use   Smoking status: Former    Packs/day: 0.80    Years: 15.00    Total pack years: 12.00    Types: Cigarettes    Quit date: 04/15/1965    Years since quitting: 56.9   Smokeless tobacco: Never  Vaping Use   Vaping Use: Never used  Substance and Sexual Activity   Alcohol use: Yes    Alcohol/week: 3.0 - 4.0 standard drinks of alcohol    Types: 3 - 4 Glasses of wine per week    Comment: 1 glass of wine occasionally (6oz glass)   Drug use: No   Sexual activity: Yes  Other Topics Concern   Not on file  Social History Narrative   Married.   Lives at home with significant other.   Retried. 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   Enjoys golfing, yard work, spending time with family.        Right Handed   1 Cup of Coffee per Day   Social Determinants of Health   Financial Resource Strain: Not on file  Food Insecurity: Not on file  Transportation Needs: Not on file  Physical Activity: Not on file  Stress: Not on file  Social Connections: Not on file    His Allergies Are:  No Known Allergies:   His Current Medications Are:  Outpatient Encounter Medications as of 03/13/2022  Medication Sig   apixaban (ELIQUIS) 2.5 MG TABS tablet Take 1 tablet (2.5 mg total) by mouth 2 (two) times daily.   carbidopa-levodopa (SINEMET CR) 50-200 MG tablet Take 1 tablet by mouth  at bedtime. Administer at 10 pm   carbidopa-levodopa (SINEMET IR)  25-100 MG tablet Take 1 tablet by mouth 4 times daily. (At [place], 12PM, 4PM and 8PM daily)   levothyroxine (SYNTHROID) 100 MCG tablet TAKE 1 TABLET DAILY ON AN  EMPTY STOMACH WITH A FULL  GLASS OF WATER   nitrofurantoin (MACRODANTIN) 50 MG capsule Take 50 mg by mouth at bedtime.   Probiotic Product (ALIGN) 4 MG CAPS Take 1 capsule by mouth daily.   rasagiline (AZILECT) 1 MG TABS tablet 1 daily administer at 6 pm   simvastatin (ZOCOR) 40 MG tablet TAKE 1 TABLET AT BEDTIME   nitroGLYCERIN (NITROSTAT) 0.4 MG SL tablet Place 1 tablet (0.4 mg total) under the tongue every 5 (five) minutes x 3 doses as needed for chest pain. (Patient not taking: Reported on 03/13/2022)   No facility-administered encounter medications on file as of 03/13/2022.  :  Review of Systems:  Out of a complete 14 point review of systems, all are reviewed and negative with the exception of these symptoms as listed below:   Review of Systems  Neurological:        Pt is here with his Wife. Pt is here for Follow Up on Parkinson. Pt's wife reports that pt has been less mobile. Pt wife states that memory has gotten worse. Pt's wife states that pt is sleeping more than he use to. Pt's wife states that he makes a noise when he is sleeping.     Objective:  Neurological Exam  Physical Exam Physical Examination:   Vitals:   03/13/22 1317  BP: 126/72  Pulse: (!) 54    General Examination: The patient is a very pleasant 88 y.o. male in no acute distress. He appears well-developed and well-nourished and well groomed.   HEENT: Normocephalic, pupils are equal, round and reactive to light, extraocular tracking is impaired, moderate dysarthria and moderate to severe hypophonia noted, mild sialorrhea.  Hearing is mildly impaired, moderate to severe facial masking noted, mild to moderate mouth dryness noted.   Chest: Clear to auscultation without wheezing, rhonchi or crackles noted.   Heart: S1+S2+0, regular and normal without  murmurs, rubs or gallops noted.    Abdomen: Soft, non-tender and non-distended.   Extremities: The distal lower extremities bilaterally show scarring from skin cancer surgeries or biopsies. Chronic mild bruising noted throughout upper and lower extremities, stable.    Skin: Warm and dry without trophic changes noted. Mild bruising on hands, stable, chronic.    Musculoskeletal: exam reveals no obvious joint deformities.   Neurologically:  Mental status: The patient is awake, pays attention, unable to provide his history.  History is provided by his significant other today.   Cranial nerves II - XII are as described above under HEENT exam.  Motor exam: thin muscle bulk globally, global strength is about 4/5. He has increased tone in the right more than left upper and lower extremities. He has a resting tremor in both upper extremities, R>L, moderate and intermittent.  Fine motor skills are moderately to severely impaired on the right and slightly better on the left. He has no significant dyskinesias today.  He is in a wheelchair, I did not have him stand or walk for me today, did not bring his walker.    Assessment and Plan:    In summary, William [name] is a very pleasant 88 year old male with an underlying complex medical history of right TIA in January 2003, SDH (s/p left craniotomy in August 2006),  hypertension, hypothyroidism, thyroid cancer, partial onset seizures in the past (off Dilantin), lumbar spine disease (s/p L spine surgery at L4-5 in January 2014), renal tumor (s/p ablative surgery in March 2014), who presents for followup consultation of his right-sided predominant [name], complicated by physical decline, constipation, recurrent falls, balance issues, sleep disturbance, mild memory issues, intermittent low blood pressure values and sialorrhea. He is on Sinemet 1 pill 4 times a day and Sinemet CR at bedtime.  He continues to take Azilect once daily.  We mutually agreed  to keep his medication regimen stable.  He has caretakers for about 8 hours every day.  We talked about the challenges of advancing [name].  We mutually agreed to pursue a video visit for next time, his mobility has steadily declined.  He is at high risk for injuries given his advanced age, advanced [name], prior history of subdural hematoma, being on a blood thinner.  He has had multiple skin cancer surgeries.  I advised against Botox injections for sialorrhea in the recent past due to high risk for side effects. He was advised to follow-up routinely to see one of our nurse practitioners in 6 months and a video visit.  We can arrange for an office visit if need be, sooner if the need arises also.  I answered all their questions today and the patient and William [name] were in agreement. I spent 30 minutes in total face-to-face time and in reviewing records during pre-charting, more than 50% of which was spent in counseling and coordination of care, reviewing test results, reviewing medications and treatment regimen and/or in discussing or reviewing the diagnosis of PD, the prognosis and treatment options. Pertinent laboratory and imaging test results that were available during this visit with the patient were reviewed by me and considered in my medical decision making (see chart for details).

## 2022-03-13 NOTE — Progress Notes (Signed)
   Subjective:    Patient ID: William [name], male    DOB: 05-Nov-1928, 88 y.o.   MRN: [id number]  HPI    Review of Systems     Objective:   Physical Exam        Assessment & Plan:

## 2022-03-13 NOTE — Patient Instructions (Signed)
It was nice to see you both again today.  As discussed, we will keep your medication stable, please follow-up to see Janett Billow in a video visit in 6 months, sooner if needed.

## 2022-03-14 ENCOUNTER — Emergency Department (HOSPITAL_COMMUNITY): Payer: Medicare HMO

## 2022-03-14 ENCOUNTER — Inpatient Hospital Stay (HOSPITAL_COMMUNITY)
Admission: EM | Admit: 2022-03-14 | Discharge: 2022-03-17 | DRG: 092 | Disposition: A | Discharge status: Nursing Home (Includes ICF) | Payer: Medicare HMO | Source: Skilled Nursing Facility | Attending: Internal Medicine | Admitting: Internal Medicine

## 2022-03-14 ENCOUNTER — Encounter (HOSPITAL_COMMUNITY): Payer: Self-pay | Admitting: Emergency Medicine

## 2022-03-14 DIAGNOSIS — Z8249 Family history of ischemic heart disease and other diseases of the circulatory system: Secondary | ICD-10-CM

## 2022-03-14 DIAGNOSIS — Z823 Family history of stroke: Secondary | ICD-10-CM

## 2022-03-14 DIAGNOSIS — G249 Dystonia, unspecified: Secondary | ICD-10-CM | POA: Diagnosis not present

## 2022-03-14 DIAGNOSIS — Z86010 Personal history of colonic polyps: Secondary | ICD-10-CM

## 2022-03-14 DIAGNOSIS — Z85850 Personal history of malignant neoplasm of thyroid: Secondary | ICD-10-CM

## 2022-03-14 DIAGNOSIS — E785 Hyperlipidemia, unspecified: Secondary | ICD-10-CM | POA: Diagnosis present

## 2022-03-14 DIAGNOSIS — G20A1 Parkinson's disease without dyskinesia, without mention of fluctuations: Secondary | ICD-10-CM | POA: Diagnosis present

## 2022-03-14 DIAGNOSIS — R569 Unspecified convulsions: Secondary | ICD-10-CM | POA: Diagnosis not present

## 2022-03-14 DIAGNOSIS — G9340 Encephalopathy, unspecified: Secondary | ICD-10-CM | POA: Diagnosis present

## 2022-03-14 DIAGNOSIS — R404 Transient alteration of awareness: Secondary | ICD-10-CM

## 2022-03-14 DIAGNOSIS — N401 Enlarged prostate with lower urinary tract symptoms: Secondary | ICD-10-CM | POA: Diagnosis present

## 2022-03-14 DIAGNOSIS — G928 Other toxic encephalopathy: Principal | ICD-10-CM | POA: Diagnosis present

## 2022-03-14 DIAGNOSIS — Z9049 Acquired absence of other specified parts of digestive tract: Secondary | ICD-10-CM

## 2022-03-14 DIAGNOSIS — Z820 Family history of epilepsy and other diseases of the nervous system: Secondary | ICD-10-CM

## 2022-03-14 DIAGNOSIS — I161 Hypertensive emergency: Secondary | ICD-10-CM | POA: Diagnosis not present

## 2022-03-14 DIAGNOSIS — E890 Postprocedural hypothyroidism: Secondary | ICD-10-CM | POA: Diagnosis present

## 2022-03-14 DIAGNOSIS — Z8673 Personal history of transient ischemic attack (TIA), and cerebral infarction without residual deficits: Secondary | ICD-10-CM

## 2022-03-14 DIAGNOSIS — Z8701 Personal history of pneumonia (recurrent): Secondary | ICD-10-CM

## 2022-03-14 DIAGNOSIS — N179 Acute kidney failure, unspecified: Principal | ICD-10-CM | POA: Diagnosis present

## 2022-03-14 DIAGNOSIS — Z87440 Personal history of urinary (tract) infections: Secondary | ICD-10-CM

## 2022-03-14 DIAGNOSIS — Z85828 Personal history of other malignant neoplasm of skin: Secondary | ICD-10-CM

## 2022-03-14 DIAGNOSIS — Z79899 Other long term (current) drug therapy: Secondary | ICD-10-CM

## 2022-03-14 DIAGNOSIS — Z79890 Hormone replacement therapy (postmenopausal): Secondary | ICD-10-CM

## 2022-03-14 DIAGNOSIS — Z781 Physical restraint status: Secondary | ICD-10-CM

## 2022-03-14 DIAGNOSIS — Z87891 Personal history of nicotine dependence: Secondary | ICD-10-CM

## 2022-03-14 DIAGNOSIS — R4182 Altered mental status, unspecified: Secondary | ICD-10-CM | POA: Insufficient documentation

## 2022-03-14 DIAGNOSIS — G20B1 Parkinson's disease with dyskinesia, without mention of fluctuations: Secondary | ICD-10-CM | POA: Diagnosis present

## 2022-03-14 DIAGNOSIS — I10 Essential (primary) hypertension: Secondary | ICD-10-CM | POA: Diagnosis not present

## 2022-03-14 DIAGNOSIS — I480 Paroxysmal atrial fibrillation: Secondary | ICD-10-CM | POA: Diagnosis present

## 2022-03-14 DIAGNOSIS — N390 Urinary tract infection, site not specified: Secondary | ICD-10-CM | POA: Insufficient documentation

## 2022-03-14 DIAGNOSIS — N138 Other obstructive and reflux uropathy: Secondary | ICD-10-CM | POA: Diagnosis present

## 2022-03-14 DIAGNOSIS — Z961 Presence of intraocular lens: Secondary | ICD-10-CM | POA: Diagnosis present

## 2022-03-14 DIAGNOSIS — M1990 Unspecified osteoarthritis, unspecified site: Secondary | ICD-10-CM | POA: Diagnosis present

## 2022-03-14 DIAGNOSIS — Z7901 Long term (current) use of anticoagulants: Secondary | ICD-10-CM

## 2022-03-14 DIAGNOSIS — M109 Gout, unspecified: Secondary | ICD-10-CM | POA: Diagnosis present

## 2022-03-14 DIAGNOSIS — Z66 Do not resuscitate: Secondary | ICD-10-CM | POA: Diagnosis present

## 2022-03-14 LAB — URINALYSIS, ROUTINE W REFLEX MICROSCOPIC
Bilirubin Urine: NEGATIVE
Glucose, UA: NEGATIVE mg/dL
Hgb urine dipstick: NEGATIVE
Ketones, ur: 5 mg/dL — AB
Nitrite: NEGATIVE
Protein, ur: 30 mg/dL — AB
Specific Gravity, Urine: 1.011 (ref 1.005–1.030)
pH: 6 (ref 5.0–8.0)

## 2022-03-14 LAB — CBC WITH DIFFERENTIAL/PLATELET
Abs Immature Granulocytes: 0.02 10*3/uL (ref 0.00–0.07)
Basophils Absolute: 0.1 10*3/uL (ref 0.0–0.1)
Basophils Relative: 1 %
Eosinophils Absolute: 0 10*3/uL (ref 0.0–0.5)
Eosinophils Relative: 1 %
HCT: 43.1 % (ref 39.0–52.0)
Hemoglobin: 13.8 g/dL (ref 13.0–17.0)
Immature Granulocytes: 0 %
Lymphocytes Relative: 12 %
Lymphs Abs: 0.8 10*3/uL (ref 0.7–4.0)
MCH: 29.7 pg (ref 26.0–34.0)
MCHC: 32 g/dL (ref 30.0–36.0)
MCV: 92.9 fL (ref 80.0–100.0)
Monocytes Absolute: 0.5 10*3/uL (ref 0.1–1.0)
Monocytes Relative: 8 %
Neutro Abs: 5 10*3/uL (ref 1.7–7.7)
Neutrophils Relative %: 78 %
Platelets: 181 10*3/uL (ref 150–400)
RBC: 4.64 MIL/uL (ref 4.22–5.81)
RDW: 14.6 % (ref 11.5–15.5)
WBC: 6.4 10*3/uL (ref 4.0–10.5)
nRBC: 0 % (ref 0.0–0.2)

## 2022-03-14 LAB — MAGNESIUM: Magnesium: 2.2 mg/dL (ref 1.7–2.4)

## 2022-03-14 LAB — COMPREHENSIVE METABOLIC PANEL
ALT: 6 U/L (ref 0–44)
AST: 18 U/L (ref 15–41)
Albumin: 4.2 g/dL (ref 3.5–5.0)
Alkaline Phosphatase: 60 U/L (ref 38–126)
Anion gap: 16 — ABNORMAL HIGH (ref 5–15)
BUN: 35 mg/dL — ABNORMAL HIGH (ref 8–23)
CO2: 24 mmol/L (ref 22–32)
Calcium: 8.7 mg/dL — ABNORMAL LOW (ref 8.9–10.3)
Chloride: 104 mmol/L (ref 98–111)
Creatinine, Ser: 2.01 mg/dL — ABNORMAL HIGH (ref 0.61–1.24)
GFR, Estimated: 31 mL/min — ABNORMAL LOW (ref >60)
Glucose, Bld: 101 mg/dL — ABNORMAL HIGH (ref 70–99)
Potassium: 4 mmol/L (ref 3.5–5.1)
Sodium: 144 mmol/L (ref 135–145)
Total Bilirubin: 0.6 mg/dL (ref 0.3–1.2)
Total Protein: 6.9 g/dL (ref 6.5–8.1)

## 2022-03-14 LAB — TROPONIN I (HIGH SENSITIVITY)
Troponin I (High Sensitivity): 33 ng/L — ABNORMAL HIGH (ref <18)
Troponin I (High Sensitivity): 45 ng/L — ABNORMAL HIGH (ref <18)

## 2022-03-14 LAB — CBG MONITORING, ED: Glucose-Capillary: 91 mg/dL (ref 70–99)

## 2022-03-14 MED ORDER — CARBIDOPA-LEVODOPA ER 50-200 MG PO TBCR
1.0000 | EXTENDED_RELEASE_TABLET | Freq: Two times a day (BID) | ORAL | Status: AC
  Administered 2022-03-14: 1 via ORAL
  Filled 2022-03-14: qty 1

## 2022-03-14 MED ORDER — CARBIDOPA-LEVODOPA 25-100 MG PO TABS
1.0000 | ORAL_TABLET | Freq: Four times a day (QID) | ORAL | Status: DC
  Administered 2022-03-15 – 2022-03-16 (×5): 1 via ORAL
  Filled 2022-03-14 (×8): qty 1

## 2022-03-14 MED ORDER — RASAGILINE MESYLATE 1 MG PO TABS
1.0000 mg | ORAL_TABLET | Freq: Every day | ORAL | Status: DC
  Administered 2022-03-15 – 2022-03-16 (×2): 1 mg via ORAL
  Filled 2022-03-14 (×4): qty 1

## 2022-03-14 MED ORDER — HYDRALAZINE HCL 20 MG/ML IJ SOLN: 10.0000 mg | Freq: Once | INTRAMUSCULAR | Status: DC

## 2022-03-14 MED ORDER — SODIUM CHLORIDE 0.9 % IV SOLN
1.0000 g | Freq: Every day | INTRAVENOUS | Status: AC
  Administered 2022-03-15 – 2022-03-16 (×3): 1 g via INTRAVENOUS
  Filled 2022-03-14 (×3): qty 10

## 2022-03-14 MED ORDER — SODIUM CHLORIDE 0.9 % IV BOLUS
500.0000 mL | Freq: Once | INTRAVENOUS | Status: AC
  Administered 2022-03-14: 500 mL via INTRAVENOUS

## 2022-03-14 MED ORDER — ACETAMINOPHEN 325 MG PO TABS: 650.0000 mg | ORAL_TABLET | Freq: Four times a day (QID) | ORAL | Status: DC | PRN

## 2022-03-14 MED ORDER — ONDANSETRON HCL 4 MG PO TABS: 4.0000 mg | ORAL_TABLET | Freq: Four times a day (QID) | ORAL | Status: DC | PRN

## 2022-03-14 MED ORDER — ACETAMINOPHEN 650 MG RE SUPP: 650.0000 mg | Freq: Four times a day (QID) | RECTAL | Status: DC | PRN

## 2022-03-14 MED ORDER — SODIUM CHLORIDE 0.45 % IV SOLN: INTRAVENOUS | Status: AC

## 2022-03-14 MED ORDER — APIXABAN 2.5 MG PO TABS
2.5000 mg | ORAL_TABLET | Freq: Two times a day (BID) | ORAL | Status: DC
  Administered 2022-03-15 – 2022-03-17 (×5): 2.5 mg via ORAL
  Filled 2022-03-14 (×6): qty 1

## 2022-03-14 MED ORDER — ONDANSETRON HCL 4 MG/2ML IJ SOLN: 4.0000 mg | Freq: Four times a day (QID) | INTRAMUSCULAR | Status: DC | PRN

## 2022-03-14 MED ORDER — CARBIDOPA-LEVODOPA ER 50-200 MG PO TBCR
1.0000 | EXTENDED_RELEASE_TABLET | Freq: Every day | ORAL | Status: DC
  Administered 2022-03-15 – 2022-03-16 (×2): 1 via ORAL
  Filled 2022-03-14 (×4): qty 1

## 2022-03-14 MED ORDER — CARBIDOPA-LEVODOPA 25-100 MG PO TABS: 1.0000 | ORAL_TABLET | Freq: Four times a day (QID) | ORAL | Status: DC

## 2022-03-14 NOTE — ED Provider Notes (Signed)
[facility] EMERGENCY DEPARTMENT Provider Note   CSN: [id number] Arrival date & time: 03/14/22  1742     History  No chief complaint on file.   William [name] is a 88 y.o. male.  He is brought in by EMS from his facility after possible seizure-like activity.  Reportedly was having tonic-clonic activity and altered mental status.  Blood pressure was markedly elevated.  Patient is awake and alert here.  He is slow to answer.  He knows it is Thursday but cannot answer me the month or year.  He denies any specific complaints other than feeling foggy.  The history is provided by the patient.  Altered Mental Status Presenting symptoms: partial responsiveness   Most recent episode:  Today Episode history:  Single Progression:  Improving Chronicity:  New Associated symptoms: abnormal movement   Associated symptoms: no fever, no nausea and no vomiting        Home Medications Prior to Admission medications   Medication Sig Start Date End Date Taking? Authorizing Provider  apixaban (ELIQUIS) 2.5 MG TABS tablet Take 1 tablet (2.5 mg total) by mouth 2 (two) times daily. 05/15/17   Minus Breeding, MD  carbidopa-levodopa (SINEMET CR) 50-200 MG tablet Take 1 tablet by mouth at bedtime. Administer at 10 pm 10/18/21   Star Age, MD  carbidopa-levodopa (SINEMET IR) 25-100 MG tablet Take 1 tablet by mouth 4 times daily. (At [place], 12PM, 4PM and 8PM daily) 12/13/21   Star Age, MD  levothyroxine (SYNTHROID) 100 MCG tablet TAKE 1 TABLET DAILY ON AN  EMPTY STOMACH WITH A FULL  GLASS OF WATER 09/30/18   Pleas Koch, NP  nitrofurantoin (MACRODANTIN) 50 MG capsule Take 50 mg by mouth at bedtime.    [provider]  nitroGLYCERIN (NITROSTAT) 0.4 MG SL tablet Place 1 tablet (0.4 mg total) under the tongue every 5 (five) minutes x 3 doses as needed for chest pain. Patient not taking: Reported on 03/13/2022 01/19/19   Pleas Koch, NP  Probiotic Product (ALIGN) 4 MG  CAPS Take 1 capsule by mouth daily.    [provider]  rasagiline (AZILECT) 1 MG TABS tablet 1 daily administer at 6 pm 10/18/21   Star Age, MD  simvastatin (ZOCOR) 40 MG tablet TAKE 1 TABLET AT BEDTIME 09/14/18   Minus Breeding, MD      Allergies    Patient has no known allergies.    Review of Systems   Review of Systems  Unable to perform ROS: Mental status change  Constitutional:  Negative for fever.  Gastrointestinal:  Negative for nausea and vomiting.    Physical Exam Updated Vital Signs BP (!) 217/121   Pulse 83   Temp 98.1 F (36.7 C) (Oral)   Resp 12   Ht '6\' 3"'$  (1.905 m)   Wt 74 kg   SpO2 94%   BMI 20.39 kg/m  Physical Exam Vitals and nursing note reviewed.  Constitutional:      General: He is not in acute distress.    Appearance: Normal appearance. He is well-developed.  HENT:     Head: Normocephalic and atraumatic.     Mouth/Throat:     Mouth: Mucous membranes are dry.  Eyes:     Conjunctiva/sclera: Conjunctivae normal.  Cardiovascular:     Rate and Rhythm: Normal rate and regular rhythm.     Heart sounds: No murmur heard. Pulmonary:     Effort: Pulmonary effort is normal. No respiratory distress.  Breath sounds: Normal breath sounds.  Abdominal:     Palpations: Abdomen is soft.     Tenderness: There is no abdominal tenderness. There is no guarding or rebound.  Musculoskeletal:        General: No deformity.     Cervical back: Neck supple.     Comments: He has a small skin tear in his left lower leg and a few abrasions on his right lower leg  Skin:    General: Skin is warm and dry.     Capillary Refill: Capillary refill takes less than 2 seconds.  Neurological:     General: No focal deficit present.     Mental Status: He is alert.     Comments: Patient is awake and following commands.  He has normal strength of his upper extremities he has limited strength of his lower extremities symmetric.  He is oriented x1.  There is no gross facial  asymmetry no slurred speech.     ED Results / Procedures / Treatments   Labs (all labs ordered are listed, but only abnormal results are displayed) Labs Reviewed  COMPREHENSIVE METABOLIC PANEL - Abnormal; Notable for the following components:      Result Value   Glucose, Bld 101 (*)    BUN 35 (*)    Creatinine, Ser 2.01 (*)    Calcium 8.7 (*)    GFR, Estimated 31 (*)    Anion gap 16 (*)    All other components within normal limits  URINALYSIS, ROUTINE W REFLEX MICROSCOPIC - Abnormal; Notable for the following components:   APPearance HAZY (*)    Ketones, ur 5 (*)    Protein, ur 30 (*)    Leukocytes,Ua SMALL (*)    Bacteria, UA FEW (*)    All other components within normal limits  BASIC METABOLIC PANEL - Abnormal; Notable for the following components:   Glucose, Bld 113 (*)    BUN 31 (*)    Creatinine, Ser 1.89 (*)    Calcium 8.5 (*)    GFR, Estimated 33 (*)    All other components within normal limits  TROPONIN I (HIGH SENSITIVITY) - Abnormal; Notable for the following components:   Troponin I (High Sensitivity) 33 (*)    All other components within normal limits  TROPONIN I (HIGH SENSITIVITY) - Abnormal; Notable for the following components:   Troponin I (High Sensitivity) 45 (*)    All other components within normal limits  URINE CULTURE  CBC WITH DIFFERENTIAL/PLATELET  MAGNESIUM  CBC  CBG MONITORING, ED  CBG MONITORING, ED    EKG EKG Interpretation  Date/Time:  Thursday March 14 2022 18:03:01 EST Ventricular Rate:  83 PR Interval:  170 QRS Duration: 155 QT Interval:  502 QTC Calculation: 590 R Axis:   -25 Text Interpretation: Sinus rhythm Probable left atrial enlargement Right bundle branch block No significant change since prior 8/18 Confirmed by [name] [phone]) on 03/14/2022 6:13:16 PM  Radiology EEG adult  Result Date: 03/15/2022 Lora Havens, MD     03/15/2022  9:49 AM Patient Name: William [name] MRN: [id number] Epilepsy Attending:  Lora Havens Referring Physician/Provider: Kerney Elbe, MD Date: 03/14/2022 Duration: 23.43 mins Patient history: 88 year old male presenting with his second episode in 3 weeks of thrashing limb movements associated with decreased level of consciousness, which resolved after 30 minutes. EEG to evaluate for seizure. Level of alertness: Awake AEDs during EEG study: None Technical aspects: This EEG study was done with scalp  electrodes positioned according to the 10-20 International system of electrode placement. Electrical activity was reviewed with band pass filter of 1-'70Hz'$ , sensitivity of 7 uV/mm, display speed of 55m/sec with a '60Hz'$  notched filter applied as appropriate. EEG data were recorded continuously and digitally stored.  Video monitoring was available and reviewed as appropriate. Description: The posterior dominant rhythm consists of 7 Hz activity of moderate voltage (25-35 uV) seen predominantly in posterior head regions, symmetric and reactive to eye opening and eye closing.  EEG showed continuous 3 to 5 Hz theta-delta slowing which appeared sharply contoured in left fronto-parietal region consistent with breach artifact.  Hyperventilation and photic stimulation were not performed.   ABNORMALITY -Breach artifact, left frontal parietal region IMPRESSION: This study is suggestive of cortical dysfunction in left fronto-parietal region likely secondary to underlying craniotomy.  No seizures or definite epileptiform discharges were seen throughout the recording. Please note that lack of epileptiform activity does not exclude the diagnosis of epilepsy. PLora Havens  DG Chest Port 1 View  Result Date: 03/14/2022 CLINICAL DATA:  Seizure-like activity and altered mental status EXAM: PORTABLE CHEST 1 VIEW COMPARISON:  Radiographs 11/25/2021 FINDINGS: Stable cardiomediastinal silhouette. Aortic atherosclerotic calcification. Bibasilar atelectasis/scarring. No focal pneumonia, pleural effusion, or  pneumothorax. No acute osseous abnormality. IMPRESSION: No active disease. Electronically Signed   By: TPlacido SouM.D.   On: 03/14/2022 19:12   CT Head Wo Contrast  Result Date: 03/14/2022 CLINICAL DATA:  Altered mental status EXAM: CT HEAD WITHOUT CONTRAST TECHNIQUE: Contiguous axial images were obtained from the base of the skull through the vertex without intravenous contrast. RADIATION DOSE REDUCTION: This exam was performed according to the departmental dose-optimization program which includes automated exposure control, adjustment of the mA and/or kV according to patient size and/or use of iterative reconstruction technique. COMPARISON:  09/06/2020 FINDINGS: Brain: There is no mass, hemorrhage or extra-axial collection. The size and configuration of the ventricles and extra-axial CSF spaces are normal. There is hypoattenuation of the white matter, most commonly indicating chronic small vessel disease. Vascular: No abnormal hyperdensity of the major intracranial arteries or dural venous sinuses. No intracranial atherosclerosis. Skull: Remote left craniotomy Sinuses/Orbits: No fluid levels or advanced mucosal thickening of the visualized paranasal sinuses. No mastoid or middle ear effusion. The orbits are normal. IMPRESSION: Chronic small vessel disease without acute intracranial abnormality. Electronically Signed   By: KUlyses JarredM.D.   On: 03/14/2022 19:04    Procedures Procedures    Medications Ordered in ED Medications  apixaban (ELIQUIS) tablet 2.5 mg (has no administration in time range)  carbidopa-levodopa (SINEMET CR) 50-200 MG per tablet controlled release 1 tablet (has no administration in time range)  rasagiline (AZILECT) tablet 1 mg (has no administration in time range)  cefTRIAXone (ROCEPHIN) 1 g in sodium chloride 0.9 % 100 mL IVPB (0 g Intravenous Stopped 03/15/22 0721)  0.45 % sodium chloride infusion ( Intravenous New Bag/Given 03/15/22 0344)  acetaminophen (TYLENOL)  tablet 650 mg (has no administration in time range)    Or  acetaminophen (TYLENOL) suppository 650 mg (has no administration in time range)  ondansetron (ZOFRAN) tablet 4 mg (has no administration in time range)    Or  ondansetron (ZOFRAN) injection 4 mg (has no administration in time range)  carbidopa-levodopa (SINEMET IR) 25-100 MG per tablet immediate release 1 tablet (has no administration in time range)  hydrALAZINE (APRESOLINE) injection 5 mg (5 mg Intravenous Given 03/15/22 0307)  sodium chloride 0.9 % bolus 500 mL (0 mLs Intravenous  Stopped 03/14/22 2033)  carbidopa-levodopa (SINEMET CR) 50-200 MG per tablet controlled release 1 tablet (1 tablet Oral Given 03/14/22 2322)    ED Course/ Medical Decision Making/ A&P Clinical Course as of 03/15/22 1037  Thu Mar 14, 2022  1814 On review of prior notes he saw neurology just recently and they did comment upon some partial seizures in the past now off Dilantin.  Has significant cognitive problems and Parkinson's with gait disturbance. [MB]  61 Son and companion here now.  They said he was thrashing about arms and legs for about 30 minutes.  They are having to restrain him.  His pupils were small and he would not answer them.  They did not know of any prior history of seizures but they said he had a similar episode about 3 weeks ago that lasted for minutes and resolved on own. [MB]  2037 BP was markedly elevated on arrival.  Family gave him his evening medications and his blood pressure seems to have normalized.  We will continue to monitor. [MB]  2140 Discussed with Dr. Cheral Marker neurology.  He is not sure what to make of this episode does not sound necessarily like seizure.  He did not think it was unreasonable to have the patient be admitted and get any EEG.  I reviewed this with the patient's companion and let her know that he is can to stay the night. [MB]  2157 Discussed with Dr. Alphonsa Gin hospitalist who will evaluate patient for  admission. [MB]    Clinical Course User Index [MB] Hayden Rasmussen, MD                           Medical Decision Making Amount and/or Complexity of Data Reviewed Labs: ordered. Radiology: ordered.  Risk Prescription drug management. Decision regarding hospitalization.   This patient complains of combative behavior question delirium versus seizure versus other, elevated blood pressure; this involves an extensive number of treatment Options and is a complaint that carries with it a high risk of complications and morbidity. The differential includes seizure, stroke, bleed, hypertensive emergency, infection, metabolic derangement  I ordered, reviewed and interpreted labs, which included CBC with normal white count normal hemoglobin, chemistries with mild AKI, urinalysis without definite signs of infection, troponins mildly elevated I ordered medication patient's home medications IV fluids and reviewed PMP when indicated. I ordered imaging studies which included chest x-ray and CT head and I independently    visualized and interpreted imaging which showed no acute findings Additional history obtained from patient's companion and son Previous records obtained and reviewed in epic including prior neurology notes I consulted Dr. Cheral Marker neurology and Dr. Maryland [facility] hospitalist and discussed lab and imaging findings and discussed disposition.  Cardiac monitoring reviewed, normal sinus rhythm Social determinants considered, no significant barriers Critical Interventions: None  After the interventions stated above, I reevaluated the patient and found patient to be awake and conversant Admission and further testing considered, patient would benefit from mission for further neurologic evaluation.  Family in agreement with plan.         Final Clinical Impression(s) / ED Diagnoses Final diagnoses:  Dyskinesia  AKI (acute kidney injury) [facility])    Rx / [facility] Orders ED Discharge  Orders     None         Hayden Rasmussen, MD 03/15/22 1042

## 2022-03-14 NOTE — ED Notes (Signed)
EEG at bedside.

## 2022-03-14 NOTE — ED Triage Notes (Signed)
Pt arrives via EMS from [facility] where family reports seizure like activity, such as arms and legs flailing all over. Pt very hypertensive for EMS, denies any falls. At baseline, pt is A&Ox4 and ambulatory.

## 2022-03-14 NOTE — H&P (Signed)
[facility] History and Physical  William [name] HBZ:[id number] DOB: Jun 26, 1928 DOA: 03/14/2022   PCP: [place], [place]  Specialists: Followed by Dr. Percival Spanish with cardiology  Chief Complaint: Shaking episode with altered mental status  HPI: William [name] is a 88 y.o. male with a past medical history of paroxysmal atrial fibrillation on anticoagulation, hypothyroidism, [name] who was in his usual state of health till earlier today when he was noted to have seizure-like activity which lasted about 30 minutes.  Apparently patient was quite somnolent and was difficult to be woken up by his significant other.  This was followed by onset of the seizure-like activity.  Apparently patient was shaking his arms and legs.  There are reports that patient was somewhat responsive during this episode.  When he was brought into the emergency department he was confused and lethargic.  But his mentation slowly started improving.  Patient denies any headache at this time.  No visual disturbances.  Denies any chest pain shortness of breath.  No nausea or vomiting.  In the emergency department patient underwent blood work which suggested acute kidney injury.  UA was noted to be mildly abnormal suggesting UTI.  CT head showed only chronic findings.  Chest x-ray was unremarkable.  Neurology was consulted.  EEG ordered.  Patient will be hospitalized for further management.  Home Medications: Prior to Admission medications   Medication Sig Start Date End Date Taking? Authorizing Provider  apixaban (ELIQUIS) 2.5 MG TABS tablet Take 1 tablet (2.5 mg total) by mouth 2 (two) times daily. 05/15/17  Yes Minus Breeding, MD  Ascorbic Acid (VITA-C PO) Take 1 tablet by mouth every morning.   Yes [provider]  carbidopa-levodopa (SINEMET CR) 50-200 MG tablet Take 1 tablet by mouth at bedtime. Administer at 10 pm 10/18/21  Yes Star Age, MD  carbidopa-levodopa (SINEMET IR) 25-100  MG tablet Take 1 tablet by mouth 4 times daily. (At [place], 12PM, 4PM and 8PM daily) 12/13/21  Yes Star Age, MD  levothyroxine (SYNTHROID) 100 MCG tablet TAKE 1 TABLET DAILY ON AN  EMPTY STOMACH WITH A FULL  GLASS OF WATER Patient taking differently: Take 100 mcg by mouth daily before breakfast. TAKE 1 TABLET DAILY ON AN  EMPTY STOMACH WITH A FULL  GLASS OF WATER 09/30/18  Yes Pleas Koch, NP  nitrofurantoin (MACRODANTIN) 50 MG capsule Take 50 mg by mouth at bedtime.   Yes [provider]  nitroGLYCERIN (NITROSTAT) 0.4 MG SL tablet Place 1 tablet (0.4 mg total) under the tongue every 5 (five) minutes x 3 doses as needed for chest pain. 01/19/19  Yes Pleas Koch, NP  Probiotic Product (ALIGN) 4 MG CAPS Take 1 capsule by mouth daily.   Yes [provider]  rasagiline (AZILECT) 1 MG TABS tablet 1 daily administer at 6 pm 10/18/21  Yes Star Age, MD  simvastatin (ZOCOR) 40 MG tablet TAKE 1 TABLET AT BEDTIME 09/14/18  Yes Hochrein, Jeneen Rinks, MD  VITAMIN E PO Take 1 tablet by mouth every morning.   Yes [provider]    Allergies: No Known Allergies  Past Medical History: Past Medical History:  Diagnosis Date   A-fib ([place])    Arthritis    "right knee" (08/09/2014)   Basal cell carcinoma    Benign prostatic hypertrophy with urinary obstruction    Chronic anticoagulation 04/15/7508   Complication of anesthesia    "difficulty intubation, had to use fiberoptic 2006"   Difficult intubation  DJD (degenerative joint disease)    Gout    Hx of colonic polyps    Hyperlipidemia    Hypertension    sees 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   Mini stroke    Parkinson disease    Pneumonia    hx of in 1952   Primary skin squamous cell carcinoma    Subdural hematoma ([place]) 2006   TIA (transient ischemic attack)    pt does not recall this hx "but may have  had one today" (08/09/2014)    Past Surgical History:  Procedure Laterality Date   APPENDECTOMY     BACK SURGERY     CATARACT EXTRACTION W/ INTRAOCULAR LENS  IMPLANT, BILATERAL Bilateral ~ [place]     CRYOABLATION  07-10-12   "tumor on cyst on my kidney; had an ablation"   EYE SURGERY     INGUINAL HERNIA REPAIR Right    IR GENERIC HISTORICAL  11/09/2015   IR RADIOLOGIST EVAL & MGMT 11/09/2015 [name], MD [facility] INTERV RAD   IR RADIOLOGIST EVAL & MGMT  08/13/2016   KIDNEY SURGERY     KNEE ARTHROSCOPY Right    LEFT HEART CATH AND CORONARY ANGIOGRAPHY N/A 12/05/2016   Procedure: LEFT HEART CATH AND CORONARY ANGIOGRAPHY;  Surgeon: [name], [name], MD;  Location: [place];  Service: Cardiovascular;  Laterality: N/A;   LUMBAR LAMINECTOMY/DECOMPRESSION MICRODISCECTOMY  05/14/2012   Procedure: LUMBAR LAMINECTOMY/DECOMPRESSION MICRODISCECTOMY 1 LEVEL;  Surgeon: [name], MD;  Location: [place] [facility];  Service: Neurosurgery;  Laterality: Left;  Left Lumbar four-five Laminectomy/Diskectomy/Far lateral diskectomy   SKIN CANCER EXCISION  "several"   "back of my neck; right eye; clavicle; nose"   SUBDURAL HEMATOMA EVACUATION VIA CRANIOTOMY  2006   THYROIDECTOMY     medullary carcinoma   TONSILLECTOMY      Social History: Lives with significant other.  No other information is available at this time due to his altered mental status.  Family History:  Family History  Problem Relation Age of Onset   Stroke Mother    Stroke Father    Heart disease Father    Alzheimer's disease Sister    Alzheimer's disease Brother    [name] Neg Hx      Review of Systems -unable to do due to his altered mental status  Physical Examination  Vitals:   03/14/22 2030 03/14/22 2200 03/14/22 2230 03/14/22 2300  BP: 128/85 (!) 193/86 (!) 187/88 (!) 187/90  Pulse: 81 71 71 69  Resp: 17 (!) '23 14 12  '$ Temp:      TempSrc:      SpO2: 98% 99% 99% 99%  Weight:       Height:        BP (!) 187/90   Pulse 69   Temp 98.1 F (36.7 C) (Oral)   Resp 12   Ht '6\' 3"'$  (1.905 m)   Wt 74 kg   SpO2 99%   BMI 20.39 kg/m   General appearance: alert, cooperative, distracted, and no distress Head: Normocephalic, without obvious abnormality, atraumatic Eyes: conjunctivae/corneas clear. PERRL, EOM's intact.  Neck: no adenopathy, no carotid bruit, no JVD, supple, symmetrical, trachea midline, and thyroid not enlarged, symmetric, no tenderness/mass/nodules Resp: clear to auscultation bilaterally Cardio: regular rate and rhythm, S1, S2 normal, no murmur, click, rub  or gallop GI: soft, non-tender; bowel sounds normal; no masses,  no organomegaly Extremities: extremities normal, atraumatic, no cyanosis or edema Pulses: 2+ and symmetric Skin: Skin color, texture, turgor normal. No rashes or lesions Lymph nodes: Cervical, supraclavicular, and axillary nodes normal. Neurologic: He is awake.  Distracted.  Moving all of his extremities.  No obvious focal neurological deficits noted.  Labs on Admission: I have personally reviewed following labs and imaging studies  CBC: Recent Labs  Lab 03/14/22 1811  WBC 6.4  NEUTROABS 5.0  HGB 13.8  HCT 43.1  MCV 92.9  PLT 765   Basic Metabolic Panel: Recent Labs  Lab 03/14/22 1811  NA 144  K 4.0  CL 104  CO2 24  GLUCOSE 101*  BUN 35*  CREATININE 2.01*  CALCIUM 8.7*  MG 2.2   GFR: Estimated Creatinine Clearance: 25.1 mL/min (A) (by C-G formula based on SCr of 2.01 mg/dL (H)). Liver Function Tests: Recent Labs  Lab 03/14/22 1811  AST 18  ALT 6  ALKPHOS 60  BILITOT 0.6  PROT 6.9  ALBUMIN 4.2    CBG: Recent Labs  Lab 03/14/22 1752  GLUCAP 91     Radiological Exams on Admission: DG Chest Port 1 View  Result Date: 03/14/2022 CLINICAL DATA:  Seizure-like activity and altered mental status EXAM: PORTABLE CHEST 1 VIEW COMPARISON:  Radiographs 11/25/2021 FINDINGS: Stable cardiomediastinal silhouette.  Aortic atherosclerotic calcification. Bibasilar atelectasis/scarring. No focal pneumonia, pleural effusion, or pneumothorax. No acute osseous abnormality. IMPRESSION: No active disease. Electronically Signed   By: Placido Sou M.D.   On: 03/14/2022 19:12   CT Head Wo Contrast  Result Date: 03/14/2022 CLINICAL DATA:  Altered mental status EXAM: CT HEAD WITHOUT CONTRAST TECHNIQUE: Contiguous axial images were obtained from the base of the skull through the vertex without intravenous contrast. RADIATION DOSE REDUCTION: This exam was performed according to the departmental dose-optimization program which includes automated exposure control, adjustment of the mA and/or kV according to patient size and/or use of iterative reconstruction technique. COMPARISON:  09/06/2020 FINDINGS: Brain: There is no mass, hemorrhage or extra-axial collection. The size and configuration of the ventricles and extra-axial CSF spaces are normal. There is hypoattenuation of the white matter, most commonly indicating chronic small vessel disease. Vascular: No abnormal hyperdensity of the major intracranial arteries or dural venous sinuses. No intracranial atherosclerosis. Skull: Remote left craniotomy Sinuses/Orbits: No fluid levels or advanced mucosal thickening of the visualized paranasal sinuses. No mastoid or middle ear effusion. The orbits are normal. IMPRESSION: Chronic small vessel disease without acute intracranial abnormality. Electronically Signed   By: Ulyses Jarred M.D.   On: 03/14/2022 19:04    My interpretation of Electrocardiogram: Sinus rhythm in the 80s.  Normal axis.  RBBB is noted.  No concerning ST or T wave changes.   Problem List  Principal Problem:   AMS (altered mental status) Active Problems:   Essential hypertension   Parkinson disease, symptomatic (HCC)   PAF (paroxysmal atrial fibrillation) ([place])   Long term current use of anticoagulant therapy   UTI (urinary tract infection)   Assessment:  This is a 88 year old Caucasian male with past medical history as stated earlier who comes in with altered mental status and seizure-like activity at home.  Plan:  #1. Altered mental status with seizure-like activity: Neurology has been consulted.  EEG has been ordered.  CT head did not show any acute findings.  UA noted to be abnormal.  See below.  Patient's mentation has been gradually improving.  #2.  Possible UTI: Patient with history of chronic UTIs and is on nitrofurantoin chronically.  UA noted to be mildly abnormal.  We will give him 3 days of ceftriaxone.  Follow-up on urine cultures.  #3.  Acute kidney injury: Presented with BUN of 35 and creatinine of 2.0.  Appears to have prerenal azotemia.  His baseline creatinine seems to be around 1.5.  He will be hydrated.  Monitor urine output.  Recheck labs tomorrow morning.  #4.  Accelerated hypertension: Blood pressure was in the 578I systolic when he initially presented to the emergency department.  Subsequently it improved but seems to be creeping up again.  Not noted to be on any antihypertensives.  Will use hydralazine as needed for now.  May need definitive management depending on blood pressure trends over the next 12 to 24 hours.  #5.  [name]: Followed by [facility].  Continue with Sinemet.  Will request speech therapy to evaluate swallow function.  #6.  Paroxysmal atrial fibrillation: Not noted to be on any rate limiting drugs.  He is on Eliquis which will be resumed from tomorrow morning.  Followed by Dr. Percival Spanish.  DVT Prophylaxis: On Eliquis Code Status: CODE STATUS discussed with the son.  He is DNR. Family Communication: Discussed with patient's son Lesly Rubenstein Disposition: Hopefully return home in improved Consults called: Neurology Admission Status: Observation status    Severity of Illness: The appropriate patient status for this patient is OBSERVATION. Observation status is judged to be  reasonable and necessary in order to provide the required intensity of service to ensure the patient's safety. The patient's presenting symptoms, physical exam findings, and initial radiographic and laboratory data in the context of their medical condition is felt to place them at decreased risk for further clinical deterioration. Furthermore, it is anticipated that the patient will be medically stable for discharge from the hospital within 2 midnights of admission.    Further management decisions will depend on results of further testing and patient's response to treatment.   Cheryle Dark [employer]  [facility] operational officer on [email or web address]  03/14/2022, 11:50 PM

## 2022-03-14 NOTE — Progress Notes (Signed)
Stat  EEG complete - results pending.  

## 2022-03-14 NOTE — Consult Note (Signed)
NEURO HOSPITALIST CONSULT NOTE   Requestig physician: Dr. Melina Copa  Reason for Consult: Acute onset of limb flailing and confusion in a patient with [name]  History obtained from:  Patient and Chart     HPI:                                                                                                                                          William [name] is an 87 y.o. male with a PMHx of right TIA in January 2003, SDH (s/p left craniotomy in August 2006), hypothyroidism, thyroid cancer, partial onset seizures in the past (off Dilantin), lumbar spine disease (s/p L spine surgery at L4-5 in January 2014), renal tumor (s/p ablative surgery in March 2014), atrial fibrillation on anticoagulation, BPH with urinary obstruction, DJD, gout, HLD, HTN, CKD3b, medullary carcinoma of the thyroid and [name] (on Sinemet and rasagiline, followed at [facility]) who presented to the ED via EMS on Thursday evening from [facility] for evaluation of seizure-like activity consisting of "arms and legs flailing all over" lasting for 30 minutes before spontaneously resolving. The flailing was so intense that he appeared almost combative and family was unable to redirect him. He was very hypertensive with EMS. On arrival to the ED he had improved. He had 1 similar episode of shorter duration about 3 weeks ago. At baseline he is alert, oriented and ambulatory.    Additional history from RN note has been reviewed: "Patient caregivers report that patient was unable to be woken from nap at 15:20, "thrashing" started around 15:55 during which patient was able to answer "yes" that he could hear the family member but nothing else. Family reports that the patient's eyes were open but unfocused. Episode lasted about 30 minutes. Patient is A&Ox4 at this time and family reports he is at neurological baseline."   Past Medical History:  Diagnosis Date   A-fib [facility])    Arthritis    "right  knee" (08/09/2014)   Basal cell carcinoma    Benign prostatic hypertrophy with urinary obstruction    Chronic anticoagulation 12/30/6058   Complication of anesthesia    "difficulty intubation, had to use fiberoptic 2006"   Difficult intubation    DJD (degenerative joint disease)    Gout    Hx of colonic polyps    Hyperlipidemia    Hypertension    sees [name]   Hypothyroidism    Kidney tumor    "tumor on cyst on kidney"    Malignant neoplasm of thyroid gland ([place])    medullary carcinoma   Mini stroke    Parkinson disease    Pneumonia    hx of in 1952   Primary skin squamous cell carcinoma    Subdural hematoma ([place]) 2006  TIA (transient ischemic attack)    pt does not recall this hx "but may have had one today" (08/09/2014)    Past Surgical History:  Procedure Laterality Date   APPENDECTOMY     BACK SURGERY     CATARACT EXTRACTION W/ INTRAOCULAR LENS  IMPLANT, BILATERAL Bilateral ~ [place]     CRYOABLATION  07-10-12   "tumor on cyst on my kidney; had an ablation"   EYE SURGERY     INGUINAL HERNIA REPAIR Right    IR GENERIC HISTORICAL  11/09/2015   IR RADIOLOGIST EVAL & MGMT 11/09/2015 [name], MD [facility] INTERV RAD   IR RADIOLOGIST EVAL & MGMT  08/13/2016   KIDNEY SURGERY     KNEE ARTHROSCOPY Right    LEFT HEART CATH AND CORONARY ANGIOGRAPHY N/A 12/05/2016   Procedure: LEFT HEART CATH AND CORONARY ANGIOGRAPHY;  Surgeon: [name], [name], MD;  Location: [place] [facility];  Service: Cardiovascular;  Laterality: N/A;   LUMBAR LAMINECTOMY/DECOMPRESSION MICRODISCECTOMY  05/14/2012   Procedure: LUMBAR LAMINECTOMY/DECOMPRESSION MICRODISCECTOMY 1 LEVEL;  Surgeon: [name], MD;  Location: [facility];  Service: Neurosurgery;  Laterality: Left;  Left Lumbar four-five Laminectomy/Diskectomy/Far lateral diskectomy   SKIN CANCER EXCISION  "several"   "back of my neck; right eye; clavicle; nose"   SUBDURAL HEMATOMA EVACUATION VIA CRANIOTOMY  2006    THYROIDECTOMY     medullary carcinoma   TONSILLECTOMY      Family History  Problem Relation Age of Onset   Stroke Mother    Stroke Father    Heart disease Father    Alzheimer's disease Sister    Alzheimer's disease Brother    [name] Neg Hx             Social History:  reports that he quit smoking about 56 years ago. His smoking use included cigarettes. He has a 12.00 pack-year smoking history. He has never used smokeless tobacco. He reports current alcohol use of about 3.0 - 4.0 standard drinks of alcohol per week. He reports that he does not use drugs.  No Known Allergies  MEDICATIONS:                                                                                                                     No current facility-administered medications on file prior to encounter.   Current Outpatient Medications on File Prior to Encounter  Medication Sig Dispense Refill   apixaban (ELIQUIS) 2.5 MG TABS tablet Take 1 tablet (2.5 mg total) by mouth 2 (two) times daily. 60 tablet 4   Ascorbic Acid (VITA-C PO) Take 1 tablet by mouth every morning.     carbidopa-levodopa (SINEMET CR) 50-200 MG tablet Take 1 tablet by mouth at bedtime. Administer at 10 pm 30 tablet 3   carbidopa-levodopa (SINEMET IR) 25-100 MG tablet Take 1 tablet by mouth 4 times daily. (At 8AM, 12PM, 4PM and 8PM daily) 360 tablet 3   levothyroxine (SYNTHROID) 100 MCG tablet TAKE 1 TABLET  DAILY ON AN  EMPTY STOMACH WITH A FULL  GLASS OF WATER (Patient taking differently: Take 100 mcg by mouth daily before breakfast. TAKE 1 TABLET DAILY ON AN  EMPTY STOMACH WITH A FULL  GLASS OF WATER) 90 tablet 1   nitrofurantoin (MACRODANTIN) 50 MG capsule Take 50 mg by mouth at bedtime.     nitroGLYCERIN (NITROSTAT) 0.4 MG SL tablet Place 1 tablet (0.4 mg total) under the tongue every 5 (five) minutes x 3 doses as needed for chest pain. 25 tablet 0   Probiotic Product (ALIGN) 4 MG CAPS Take 1 capsule by mouth daily.     rasagiline  (AZILECT) 1 MG TABS tablet 1 daily administer at 6 pm 90 tablet 3   simvastatin (ZOCOR) 40 MG tablet TAKE 1 TABLET AT BEDTIME 90 tablet 1   VITAMIN E PO Take 1 tablet by mouth every morning.      Scheduled:  apixaban  2.5 mg Oral BID   carbidopa-levodopa  1 tablet Oral QHS   carbidopa-levodopa  1 tablet Oral QID   rasagiline  1 mg Oral q1800   Continuous:  sodium chloride 75 mL/hr at 03/15/22 0344   cefTRIAXone (ROCEPHIN)  IV 1 g (03/15/22 0310)     ROS:                                                                                                                                       Unable to obtain a detailed ROS due to cognitive/communication deficits.    Blood pressure 128/85, pulse 81, temperature 98.1 F (36.7 C), temperature source Oral, resp. rate 17, height 6' 3" (1.905 m), weight 74 kg, SpO2 98 %.   General Examination:                                                                                                       Physical Exam  HEENT-  [place]/AT. Oral mucosa poorly hydrated. No neck stiffness.    Lungs- Respirations unlabored Extremities- No edema. Cut with dried blood to left shin.    Neurological Examination Mental Status: Initially asleep, the patient gradually awakens to vocal and light stimuli. In the context of poorly hydrated oral mucosa, dysarthria is noted. Speech is sparse with short sentences but no errors of grammar or syntax noted. Able to follow all commands. Not oriented to the day "weekend", the year "2021", the month "May" or the city, but does know he is in [place] and that we are in one  of the cold months of the year. He is unable to recall the name of the recent holiday. He is able to clarify that he has [name] and is on Sinemet. Increased latencies of verbal and motor responses.  Cranial Nerves: II: Temporal visual fields intact in each eye. PERRL  III,IV, VI: EOMI without nystagmus or forced gaze deviation. No ptosis.  V: FT  intact bilaterally VII: Smiles symmetrically.  VIII: Hearing intact to voice IX,X: Hypophonic speech XI: Head is midline.  XII: Midline tongue extension Motor: Right : Upper extremity   5/5    Left:     Upper extremity   5/5  Lower extremity   5/5     Lower extremity   5/5 Mild cogwheel rigidity LUE, moderate RUE.  Increased tone BLE.  One instance of negative myoclonus at the shoulders with arms outstretched.  No pronator drift.  Sensory: Light touch intact in all 4 limbs without asymmetry Deep Tendon Reflexes: 2+ bilateral brachioradialis. Unable to elicit patellar reflexes in the context of increased tone. 0 bilateral achilles. Toes equivocal bilaterally.  Cerebellar: No ataxia with FNF bilaterally  Gait: Deferred   Lab Results: Basic Metabolic Panel: Recent Labs  Lab 03/14/22 1811  NA 144  K 4.0  CL 104  CO2 24  GLUCOSE 101*  BUN 35*  CREATININE 2.01*  CALCIUM 8.7*  MG 2.2    CBC: Recent Labs  Lab 03/14/22 1811  WBC 6.4  NEUTROABS 5.0  HGB 13.8  HCT 43.1  MCV 92.9  PLT 181    Cardiac Enzymes: No results for input(s): "CKTOTAL", "CKMB", "CKMBINDEX", "TROPONINI" in the last 168 hours.  Lipid Panel: No results for input(s): "CHOL", "TRIG", "HDL", "CHOLHDL", "VLDL", "LDLCALC" in the last 168 hours.  Imaging: DG Chest Port 1 View  Result Date: 03/14/2022 CLINICAL DATA:  Seizure-like activity and altered mental status EXAM: PORTABLE CHEST 1 VIEW COMPARISON:  Radiographs 11/25/2021 FINDINGS: Stable cardiomediastinal silhouette. Aortic atherosclerotic calcification. Bibasilar atelectasis/scarring. No focal pneumonia, pleural effusion, or pneumothorax. No acute osseous abnormality. IMPRESSION: No active disease. Electronically Signed   By: Placido Sou M.D.   On: 03/14/2022 19:12   CT Head Wo Contrast  Result Date: 03/14/2022 CLINICAL DATA:  Altered mental status EXAM: CT HEAD WITHOUT CONTRAST TECHNIQUE: Contiguous axial images were obtained from the base of  the skull through the vertex without intravenous contrast. RADIATION DOSE REDUCTION: This exam was performed according to the departmental dose-optimization program which includes automated exposure control, adjustment of the mA and/or kV according to patient size and/or use of iterative reconstruction technique. COMPARISON:  09/06/2020 FINDINGS: Brain: There is no mass, hemorrhage or extra-axial collection. The size and configuration of the ventricles and extra-axial CSF spaces are normal. There is hypoattenuation of the white matter, most commonly indicating chronic small vessel disease. Vascular: No abnormal hyperdensity of the major intracranial arteries or dural venous sinuses. No intracranial atherosclerosis. Skull: Remote left craniotomy Sinuses/Orbits: No fluid levels or advanced mucosal thickening of the visualized paranasal sinuses. No mastoid or middle ear effusion. The orbits are normal. IMPRESSION: Chronic small vessel disease without acute intracranial abnormality. Electronically Signed   By: Ulyses Jarred M.D.   On: 03/14/2022 19:04     Assessment: 88 year old male presenting with his second episode in 3 weeks of thrashing limb movements associated with decreased level of consciousness, which resolved after 30 minutes.  - Exam reveals confusion, disorientation, increased latencies of verbal and motor responses, asymmetric cogwheeling of BUE and one instance of  negative myoclonus at the shoulders with arms outstretched.  - EEG completed. Preliminary review reveals diffuse slowing with some beta activity. No asymmetry noted. A short approximately 10 second run of higher amplitude waveforms with slower frequency is noted, but without rhythmicity. No electrographic seizures seen. Official EEG read is pending  - CT head:  Chronic small vessel disease without acute intracranial abnormality. - DDx includes dyskinesia secondary to a transient spike in serum levels of levodopa, possibly due to a  levodopa dose that is somewhat too high. Of note, 66% of levodopa is cleared via the urine and the patient does have AKI currently with eGFR of 31. Normally the elimination half-life of levodopa is about 100 minutes, which in our case may be significantly increased due to the patient's AKI and therefore is possibly contributing to supratherapeutic serum levels transiently. Also on DDx is new onset seizures, but this is felt less likely than drug-induced dyskinesias.   Recommendations: - Management of the patient's AKI on CKD. His baseline creatinine seems to be around 1.5; at presentation Cr was 2.0 - Continue Eliquis for his paroxysmal atrial fibrillation - Will hold off on lowering the patient's Sinemet dosing as it would be helpful for neurology to be able to witness an episode of his abnormal movements during his inpatient stay to differentiate between seizure activity and dyskinesia. However, if there is rapid improvement in his renal function with hydration, and if drug induced dyskinesias are the etiology for his presentation, then the symptoms may not recur during this stay.  - Ordering LTM EEG to see if we can characterize a spell - PT/OT/Speech - Avoid sedating medications   Electronically signed: Dr. Kerney Elbe 03/14/2022, 9:35 PM

## 2022-03-14 NOTE — ED Notes (Signed)
Patient caregivers report that patient was unable to be woken from nap at 15:20, "thrashing" started around 15:55 during which patient was able to answer "yes" that he could hear the family member but nothing else. Family reports that the patient's eyes were open but unfocused. Episode lasted about 30 minutes. Patient is A&Ox4 at this time and family reports he is at neurological baseline.

## 2022-03-15 ENCOUNTER — Observation Stay (HOSPITAL_COMMUNITY): Payer: Medicare HMO

## 2022-03-15 DIAGNOSIS — R4689 Other symptoms and signs involving appearance and behavior: Secondary | ICD-10-CM

## 2022-03-15 DIAGNOSIS — Z961 Presence of intraocular lens: Secondary | ICD-10-CM | POA: Diagnosis present

## 2022-03-15 DIAGNOSIS — Z79890 Hormone replacement therapy (postmenopausal): Secondary | ICD-10-CM | POA: Diagnosis not present

## 2022-03-15 DIAGNOSIS — R259 Unspecified abnormal involuntary movements: Secondary | ICD-10-CM | POA: Diagnosis not present

## 2022-03-15 DIAGNOSIS — M1990 Unspecified osteoarthritis, unspecified site: Secondary | ICD-10-CM | POA: Diagnosis present

## 2022-03-15 DIAGNOSIS — E785 Hyperlipidemia, unspecified: Secondary | ICD-10-CM | POA: Diagnosis present

## 2022-03-15 DIAGNOSIS — Z66 Do not resuscitate: Secondary | ICD-10-CM | POA: Diagnosis present

## 2022-03-15 DIAGNOSIS — I161 Hypertensive emergency: Secondary | ICD-10-CM | POA: Diagnosis not present

## 2022-03-15 DIAGNOSIS — R569 Unspecified convulsions: Secondary | ICD-10-CM

## 2022-03-15 DIAGNOSIS — N138 Other obstructive and reflux uropathy: Secondary | ICD-10-CM | POA: Diagnosis present

## 2022-03-15 DIAGNOSIS — Z87891 Personal history of nicotine dependence: Secondary | ICD-10-CM | POA: Diagnosis not present

## 2022-03-15 DIAGNOSIS — N179 Acute kidney failure, unspecified: Secondary | ICD-10-CM | POA: Diagnosis present

## 2022-03-15 DIAGNOSIS — R4182 Altered mental status, unspecified: Secondary | ICD-10-CM

## 2022-03-15 DIAGNOSIS — Z85850 Personal history of malignant neoplasm of thyroid: Secondary | ICD-10-CM | POA: Diagnosis not present

## 2022-03-15 DIAGNOSIS — G20B1 Parkinson's disease with dyskinesia, without mention of fluctuations: Secondary | ICD-10-CM | POA: Diagnosis present

## 2022-03-15 DIAGNOSIS — Z79899 Other long term (current) drug therapy: Secondary | ICD-10-CM | POA: Diagnosis not present

## 2022-03-15 DIAGNOSIS — I480 Paroxysmal atrial fibrillation: Secondary | ICD-10-CM | POA: Diagnosis present

## 2022-03-15 DIAGNOSIS — Z7901 Long term (current) use of anticoagulants: Secondary | ICD-10-CM | POA: Diagnosis not present

## 2022-03-15 DIAGNOSIS — Z781 Physical restraint status: Secondary | ICD-10-CM | POA: Diagnosis not present

## 2022-03-15 DIAGNOSIS — G9340 Encephalopathy, unspecified: Secondary | ICD-10-CM | POA: Diagnosis present

## 2022-03-15 DIAGNOSIS — R410 Disorientation, unspecified: Secondary | ICD-10-CM | POA: Diagnosis not present

## 2022-03-15 DIAGNOSIS — G20A2 Parkinson's disease without dyskinesia, with fluctuations: Secondary | ICD-10-CM | POA: Diagnosis not present

## 2022-03-15 DIAGNOSIS — Z86010 Personal history of colonic polyps: Secondary | ICD-10-CM | POA: Diagnosis not present

## 2022-03-15 DIAGNOSIS — N390 Urinary tract infection, site not specified: Secondary | ICD-10-CM | POA: Diagnosis present

## 2022-03-15 DIAGNOSIS — R404 Transient alteration of awareness: Secondary | ICD-10-CM | POA: Diagnosis not present

## 2022-03-15 DIAGNOSIS — N401 Enlarged prostate with lower urinary tract symptoms: Secondary | ICD-10-CM | POA: Diagnosis present

## 2022-03-15 DIAGNOSIS — Z8673 Personal history of transient ischemic attack (TIA), and cerebral infarction without residual deficits: Secondary | ICD-10-CM | POA: Diagnosis not present

## 2022-03-15 DIAGNOSIS — Z85828 Personal history of other malignant neoplasm of skin: Secondary | ICD-10-CM | POA: Diagnosis not present

## 2022-03-15 DIAGNOSIS — G928 Other toxic encephalopathy: Secondary | ICD-10-CM | POA: Diagnosis present

## 2022-03-15 DIAGNOSIS — M109 Gout, unspecified: Secondary | ICD-10-CM | POA: Diagnosis present

## 2022-03-15 DIAGNOSIS — E890 Postprocedural hypothyroidism: Secondary | ICD-10-CM | POA: Diagnosis present

## 2022-03-15 DIAGNOSIS — I10 Essential (primary) hypertension: Secondary | ICD-10-CM | POA: Diagnosis present

## 2022-03-15 LAB — CBC
HCT: 43.2 % (ref 39.0–52.0)
Hemoglobin: 13.6 g/dL (ref 13.0–17.0)
MCH: 29.2 pg (ref 26.0–34.0)
MCHC: 31.5 g/dL (ref 30.0–36.0)
MCV: 92.7 fL (ref 80.0–100.0)
Platelets: 174 10*3/uL (ref 150–400)
RBC: 4.66 MIL/uL (ref 4.22–5.81)
RDW: 14.6 % (ref 11.5–15.5)
WBC: 5.8 10*3/uL (ref 4.0–10.5)
nRBC: 0 % (ref 0.0–0.2)

## 2022-03-15 LAB — BASIC METABOLIC PANEL
Anion gap: 12 (ref 5–15)
BUN: 31 mg/dL — ABNORMAL HIGH (ref 8–23)
CO2: 26 mmol/L (ref 22–32)
Calcium: 8.5 mg/dL — ABNORMAL LOW (ref 8.9–10.3)
Chloride: 104 mmol/L (ref 98–111)
Creatinine, Ser: 1.89 mg/dL — ABNORMAL HIGH (ref 0.61–1.24)
GFR, Estimated: 33 mL/min — ABNORMAL LOW (ref >60)
Glucose, Bld: 113 mg/dL — ABNORMAL HIGH (ref 70–99)
Potassium: 4.1 mmol/L (ref 3.5–5.1)
Sodium: 142 mmol/L (ref 135–145)

## 2022-03-15 MED ORDER — HYDRALAZINE HCL 20 MG/ML IJ SOLN
5.0000 mg | Freq: Four times a day (QID) | INTRAMUSCULAR | Status: DC | PRN
  Administered 2022-03-15 – 2022-03-16 (×4): 5 mg via INTRAVENOUS
  Filled 2022-03-15 (×4): qty 1

## 2022-03-15 NOTE — Progress Notes (Signed)
LTM EEG hooked up and running - no initial skin breakdown - push button tested   

## 2022-03-15 NOTE — Evaluation (Signed)
Clinical/Bedside Swallow Evaluation Patient Details  Name: William [name] MRN: [id number] Date of Birth: 03-06-1929  Today's Date: 03/15/2022 Time: SLP Start Time (ACUTE ONLY): 1200 SLP Stop Time (ACUTE ONLY): 5449 SLP Time Calculation (min) (ACUTE ONLY): 30 min  Past Medical History:  Past Medical History:  Diagnosis Date   A-fib ([place])    Arthritis    "right knee" (08/09/2014)   Basal cell carcinoma    Benign prostatic hypertrophy with urinary obstruction    Chronic anticoagulation 05/16/69   Complication of anesthesia    "difficulty intubation, had to use fiberoptic 2006"   Difficult intubation    DJD (degenerative joint disease)    Gout    Hx of colonic polyps    Hyperlipidemia    Hypertension    sees [name]   Hypothyroidism    Kidney tumor    "tumor on cyst on kidney"    Malignant neoplasm of thyroid gland ([place])    medullary carcinoma   Mini stroke    Parkinson disease    Pneumonia    hx of in 1952   Primary skin squamous cell carcinoma    Subdural hematoma ([place]) 2006   TIA (transient ischemic attack)    pt does not recall this hx "but may have had one today" (08/09/2014)   Past Surgical History:  Past Surgical History:  Procedure Laterality Date   APPENDECTOMY     BACK SURGERY     CATARACT EXTRACTION W/ INTRAOCULAR LENS  IMPLANT, BILATERAL Bilateral ~ [place]     CRYOABLATION  07-10-12   "tumor on cyst on my kidney; had an ablation"   EYE SURGERY     INGUINAL HERNIA REPAIR Right    IR GENERIC HISTORICAL  11/09/2015   IR RADIOLOGIST EVAL & MGMT 11/09/2015 [name], MD [facility] INTERV RAD   IR RADIOLOGIST EVAL & MGMT  08/13/2016   KIDNEY SURGERY     KNEE ARTHROSCOPY Right    LEFT HEART CATH AND CORONARY ANGIOGRAPHY N/A 12/05/2016   Procedure: LEFT HEART CATH AND CORONARY ANGIOGRAPHY;  Surgeon: [name], [name], MD;  Location: [facility];  Service: Cardiovascular;  Laterality: N/A;   LUMBAR LAMINECTOMY/DECOMPRESSION  MICRODISCECTOMY  05/14/2012   Procedure: LUMBAR LAMINECTOMY/DECOMPRESSION MICRODISCECTOMY 1 LEVEL;  Surgeon: [name], MD;  Location: [place] [facility];  Service: Neurosurgery;  Laterality: Left;  Left Lumbar four-five Laminectomy/Diskectomy/Far lateral diskectomy   SKIN CANCER EXCISION  "several"   "back of my neck; right eye; clavicle; nose"   SUBDURAL HEMATOMA EVACUATION VIA CRANIOTOMY  2006   THYROIDECTOMY     medullary carcinoma   TONSILLECTOMY     HPI:  88yo male admitted 03/14/22 with AMS and seizure-like episode for 30 minutes. Bloodwork revealed acute kidney injury. CXR unremarkable. PMH: PAFib, hypothyroidism, [name]    Assessment / Plan / Recommendation  Clinical Impression  Pt see at bedside for assessment of swallow function and safety, and to identify least restrictive diet. Pt was awake and alert.  EEG ongoing. Pt's wife was at bedside during this evaluation. Pt reported no difficulty swallowing prior to admit. Pt presents with adequate natural dentition. Oral cavity is pink, moist, and healthy. CN exam is unremarkable. Pt accepted trials of ice chips, thin liquid, puree, and solid textures. No obvious difficulty orally, and no overt s/s aspiration following any texture given. Pt passed the 3oz water challenge, indicating low risk of aspiration. Recommend beginning regular diet with thin liquids, meds as tolerated. While no  overt s/s aspiration noted during this assessment, pt is at increased risk of aspiration due to Parkinson's. Adherence to general safe swallow precautions is recommended.  SLP Visit Diagnosis: Dysphagia, unspecified (R13.10)    Aspiration Risk  Mild aspiration risk    Diet Recommendation Regular;Thin liquid   Liquid Administration via: Straw;Cup Medication Administration: Other (Comment) (as tolerated) Supervision: Patient able to self feed Compensations: Minimize environmental distractions;Slow rate;Small sips/bites Postural Changes: Seated  upright at 90 degrees    Other  Recommendations Oral Care Recommendations: Oral care BID    Recommendations for follow up therapy are one component of a multi-disciplinary discharge planning process, led by the attending physician.  Recommendations may be updated based on patient status, additional functional criteria and insurance authorization.  Follow up Recommendations No SLP follow up      Assistance Recommended at Discharge PRN  Functional Status Assessment Patient has not had a recent decline in their functional status      Prognosis Prognosis for Safe Diet Advancement: Good      Swallow Study   General Date of Onset: 03/14/22 HPI: 88yo male admitted 03/14/22 with AMS and seizure-like episode for 30 minutes. Bloodwork revealed acute kidney injury. CXR unremarkable. PMH: PAFib, hypothyroidism, [name] Type of Study: Bedside Swallow Evaluation Previous Swallow Assessment: OP ST in 2016 for dysarthria and loudness Diet Prior to this Study: NPO Temperature Spikes Noted: No Respiratory Status: Room air History of Recent Intubation: No Behavior/Cognition: Alert;Cooperative;Pleasant mood Oral Cavity Assessment: Within Functional Limits Oral Care Completed by SLP: Yes Oral Cavity - Dentition: Adequate natural dentition Vision: Functional for self-feeding Self-Feeding Abilities: Able to feed self;Needs set up;Needs assist Patient Positioning: Upright in bed Baseline Vocal Quality: Normal Volitional Cough: Strong Volitional Swallow: Able to elicit    Oral/Motor/Sensory Function Overall Oral Motor/Sensory Function: Within functional limits   Ice Chips Ice chips: Within functional limits Presentation: Spoon   Thin Liquid Thin Liquid: Within functional limits Presentation: Straw    Nectar Thick Nectar Thick Liquid: Not tested   Honey Thick Honey Thick Liquid: Not tested   Puree Puree: Within functional limits Presentation: Spoon   Solid     Solid: Within  functional limits Presentation: [place] B. Quentin [place], [facility], [place] Speech Language Pathologist Office: [phone]  Shonna Chock 03/15/2022,12:55 PM

## 2022-03-15 NOTE — Procedures (Addendum)
Patient Name: William [name]  MRN: [id number]  Epilepsy Attending: Lora Havens  Referring Physician/Provider: Kerney Elbe, MD  Date: 03/14/2022  Duration: 23.43 mins  Patient history: 88 year old male presenting with his second episode in 3 weeks of thrashing limb movements associated with decreased level of consciousness, which resolved after 30 minutes. EEG to evaluate for seizure.   Level of alertness: Awake  AEDs during EEG study: None  Technical aspects: This EEG study was done with scalp electrodes positioned according to the 10-20 International system of electrode placement. Electrical activity was reviewed with band pass filter of 1-'70Hz'$ , sensitivity of 7 uV/mm, display speed of 64m/sec with a '60Hz'$  notched filter applied as appropriate. EEG data were recorded continuously and digitally stored.  Video monitoring was available and reviewed as appropriate.  Description: The posterior dominant rhythm consists of 7 Hz activity of moderate voltage (25-35 uV) seen predominantly in posterior head regions, symmetric and reactive to eye opening and eye closing.  EEG showed continuous 3 to 5 Hz theta-delta slowing which appeared sharply contoured in left fronto-parietal region consistent with breach artifact.  Hyperventilation and photic stimulation were not performed.     ABNORMALITY -Breach artifact, left frontal parietal region  IMPRESSION: This study is suggestive of cortical dysfunction in left fronto-parietal region likely secondary to underlying craniotomy.  No seizures or definite epileptiform discharges were seen throughout the recording.  Please note that lack of epileptiform activity does not exclude the diagnosis of epilepsy.  Tomica Arseneault OBarbra Sarks

## 2022-03-15 NOTE — Progress Notes (Signed)
Neurology Attending Attestation   I examined the patient and discussed plan with Ms. William Merl NP. Below note has been edited by me to reflect my findings and recommendations with the following additions: Patient's event yesterday came out of sleep and involved disorientation, agitation, flailing around trying to get out of bed.  He was not responding appropriately to family members at that time.  It lasted for approximately 30 minutes.  Based on the history provided by patient and his wife these are not consistent with dyskinesias.  He has been on the same dose of Sinemet for a long time and has not had issues with dyskinesias in the past.  We will continue his current dosing.  Differential for his event includes seizure versus sleep disorder blurring the transition between wake and sleep.  However if the latter were the case I would expect to resolve before 30 minutes.  He had 1 similar event previously that lasted only for a few minutes that also came out of sleep.  Continue LTM for another 24 hours.  We will continue to follow.   Su Monks, MD [facility] [phone]   If 7pm- 7am, please page neurology on call as listed in [place].   Neurology Progress Note  Brief HPI: William [name] is an 88 y.o. male with a PMHx of right TIA in January 2003, SDH (s/p left craniotomy in August 2006), hypothyroidism, thyroid cancer, partial onset seizures in the past (off Dilantin), lumbar spine disease (s/p L spine surgery at L4-5 in January 2014), renal tumor (s/p ablative surgery in March 2014), atrial fibrillation on anticoagulation, BPH with urinary obstruction, DJD, gout, HLD, HTN, CKD3b, medullary carcinoma of the thyroid and [name] (on Sinemet and rasagiline, followed at [facility]) who presented to the ED via EMS on Thursday evening from [facility] for evaluation of seizure-like activity consisting of "arms and legs flailing all over" lasting for 30 minutes before spontaneously  resolving.   He was seen by Dr. Rexene Alberts with [facility] on 11/29  Subjective: Patient seen in room.   Exam: Vitals:   03/15/22 0715 03/15/22 0800  BP: (!) 129/101 (!) 181/91  Pulse:  64  Resp: 13 16  Temp:    SpO2: 99% 100%   Gen: In bed, NAD Resp: non-labored breathing, no acute distress Abd: soft, nt  Neuro: Mental Status: Alert to self, unable to tell me date or location. He is able to tell me that he came to the hospital because of his arms and legs flailing.  Speech is clear. Patient is able to give a clear and coherent history. No signs of aphasia or neglect Cranial Nerves: II: Visual Fields are full. PERRL.   III,IV, VI: EOMI without ptosis or diploplia.  V: Facial sensation is symmetric to temperature VII: Facial movement is symmetric resting and smiling VIII: Hearing is intact to voice X: Palate elevates symmetrically XI: Shoulder shrug is symmetric. XII: Tongue protrudes midline without atrophy or fasciculations.  Motor: Tone is normal. Bulk is normal.  Tone is increased. Myoclonus in right lower extremity  5/5 strength was present in all four extremities.  Sensory: Sensation is symmetric to light touch and temperature in the arms and legs. No extinction to DSS present.  DTRs: 2+ and symmetric in the biceps Unable to illicit in patellar  Cerebellar: FNF intact bilaterally, unable to participate in HKS Gait: steady or deferred for safety   Pertinent Labs: Cr 1.89 Troponin 45 UA- leukocytes- small, bacteria few   Imaging Reviewed: EEG- 03/14/2022-  This study is suggestive of cortical dysfunction in right frontal parietal region likely secondary to underlying craniotomy.  No seizures or definite epileptiform discharges were seen throughout the recording.   Assessment: [name] is an 88 y.o. male with a PMHx of right TIA in January 2003, SDH (s/p left craniotomy in August 2006), hypothyroidism, thyroid cancer, partial onset seizures in the past (off  Dilantin), lumbar spine disease (s/p L spine surgery at L4-5 in January 2014), renal tumor (s/p ablative surgery in March 2014), atrial fibrillation on anticoagulation, BPH with urinary obstruction, DJD, gout, HLD, HTN, CKD3b, medullary carcinoma of the thyroid and [name] (on Sinemet and rasagiline, followed at [facility]) who presented to the ED via EMS on Thursday evening from [facility] for evaluation of seizure-like activity consisting of "arms and legs flailing all over" lasting for 30 minutes before spontaneously resolving. He tells me the jerking movements started around 1530 yesterday He takes sinemet 4x per day and the CR sinemet at bedtime. He states he thinks the times are 0800, 1200, 1600, 2000, 2200  Impression: Episode of altered consciousness and agitation with flailing limb movements  Recommendations: - cEEG in place  - Neurology will continue to follow    Patient seen and examined by NP/APP with MD. MD to update note as needed.   Janine Ores, DNP, FNP-BC [facility] Pager: [phone]

## 2022-03-15 NOTE — Progress Notes (Signed)
PROGRESS NOTE    William [name]  FBP:[id number] DOB: 1929-03-17 DOA: 03/14/2022 PCP: [place], [place]   Brief Narrative:  William [name] is a 88 y.o. male with a past medical history of paroxysmal atrial fibrillation on anticoagulation, hypothyroidism, [name] who was in his usual state of health till earlier today when he was noted to have seizure-like activity which lasted about 30 minutes.  Apparently patient was quite somnolent and was difficult to be woken up by his significant other.  This was followed by onset of the seizure-like activity.  Apparently patient was shaking his arms and legs.  Admitted for further workup given concern for seizure-like activity, rule out infectious process polypharmacy or other clear indications of mental status changes.   Assessment & Plan:   Principal Problem:   AMS (altered mental status) Active Problems:   Essential hypertension   Parkinson disease, symptomatic (HCC)   PAF (paroxysmal atrial fibrillation) (HCC)   Long term current use of anticoagulant therapy   UTI (urinary tract infection)   Acute encephalopathy, metabolic versus toxic Rule out seizure-like activity -Mental status improving with supportive care although not yet back to baseline -EEG ongoing -CT head unremarkable -Neurology following, appreciate insight and recommendations. -UA abnormal concerning for UTI, see below   Possible UTI Patient does not meet sepsis criteria  Continue 3 days of ceftriaxone, follow cultures  Patient is on nitrofurantoin chronically due to recurrent  chronic UTIs.    AKI without history of CKD  -Continue supportive care, IV fluids, advance diet as tolerated -Follow repeat labs, monitor ins and outs  Hypertensive emergency:  -Complicating mental status changes as above, may be secondary to profound hypertension -Follow repeat vitals, no home medications noted, may titrate medications here if  necessary.  [name]: Followed by [facility].   Continue with Sinemet. Therapy to follow   Paroxysmal atrial fibrillation:  Not currently on rate limiting medications, continue Eliquis   DVT prophylaxis: Eliquis 2.5 twice daily given age and weight Code Status: DNR Family Communication: Wife at bedside  Status is: Inpatient  Dispo: The patient is from: Home              Anticipated d/c is to: To be determined              Anticipated d/c date is: 40 to 72 hours              Patient currently not medically stable for discharge  Consultants:  Neurology  Procedures:  None  Antimicrobials:  Ceftriaxone x 3 days  Subjective: No acute issues or events overnight, patient's mental status improving but not yet back to baseline.  States he feels well otherwise denies nausea vomiting diarrhea constipation any fevers chills or chest pain  Objective: Vitals:   03/15/22 0330 03/15/22 0707 03/15/22 0709 03/15/22 0715  BP: (!) 175/97   (!) 129/101  Pulse: 73     Resp: '15  15 13  '$ Temp:  98 F (36.7 C)    TempSrc:  Oral    SpO2: 99%   99%  Weight:      Height:       No intake or output data in the 24 hours ending 03/15/22 0816 Filed Weights   03/14/22 1757  Weight: 74 kg    Examination:  General:  Pleasantly resting in bed, No acute distress.  Oriented to person place and general situation, sluggish to respond to simple questions and commands HEENT:  Normocephalic  atraumatic.  Sclerae nonicteric, noninjected.  Extraocular movements intact bilaterally. Neck:  Without mass or deformity.  Trachea is midline. Lungs:  Clear to auscultate bilaterally without rhonchi, wheeze, or rales. Heart: Irregularly irregular without murmurs rubs or gallops Abdomen:  Soft, nontender, nondistended.  Without guarding or rebound. Extremities: Without cyanosis, clubbing, edema, or obvious deformity.  Data Reviewed: I have personally reviewed following labs and  imaging studies  CBC: Recent Labs  Lab 03/14/22 1811 03/15/22 0507  WBC 6.4 5.8  NEUTROABS 5.0  --   HGB 13.8 13.6  HCT 43.1 43.2  MCV 92.9 92.7  PLT 181 623   Basic Metabolic Panel: Recent Labs  Lab 03/14/22 1811 03/15/22 0507  NA 144 142  K 4.0 4.1  CL 104 104  CO2 24 26  GLUCOSE 101* 113*  BUN 35* 31*  CREATININE 2.01* 1.89*  CALCIUM 8.7* 8.5*  MG 2.2  --    GFR: Estimated Creatinine Clearance: 26.6 mL/min (A) (by C-G formula based on SCr of 1.89 mg/dL (H)). Liver Function Tests: Recent Labs  Lab 03/14/22 1811  AST 18  ALT 6  ALKPHOS 60  BILITOT 0.6  PROT 6.9  ALBUMIN 4.2   No results for input(s): "LIPASE", "AMYLASE" in the last 168 hours. No results for input(s): "AMMONIA" in the last 168 hours. Coagulation Profile: No results for input(s): "INR", "PROTIME" in the last 168 hours. Cardiac Enzymes: No results for input(s): "CKTOTAL", "CKMB", "CKMBINDEX", "TROPONINI" in the last 168 hours. BNP (last 3 results) No results for input(s): "PROBNP" in the last 8760 hours. HbA1C: No results for input(s): "HGBA1C" in the last 72 hours. CBG: Recent Labs  Lab 03/14/22 1752  GLUCAP 91   Lipid Profile: No results for input(s): "CHOL", "HDL", "LDLCALC", "TRIG", "CHOLHDL", "LDLDIRECT" in the last 72 hours. Thyroid Function Tests: No results for input(s): "TSH", "T4TOTAL", "FREET4", "T3FREE", "THYROIDAB" in the last 72 hours. Anemia Panel: No results for input(s): "VITAMINB12", "FOLATE", "FERRITIN", "TIBC", "IRON", "RETICCTPCT" in the last 72 hours. Sepsis Labs: No results for input(s): "PROCALCITON", "LATICACIDVEN" in the last 168 hours.  No results found for this or any previous visit (from the past 240 hour(s)).       Radiology Studies: DG Chest Port 1 View  Result Date: 03/14/2022 CLINICAL DATA:  Seizure-like activity and altered mental status EXAM: PORTABLE CHEST 1 VIEW COMPARISON:  Radiographs 11/25/2021 FINDINGS: Stable cardiomediastinal  silhouette. Aortic atherosclerotic calcification. Bibasilar atelectasis/scarring. No focal pneumonia, pleural effusion, or pneumothorax. No acute osseous abnormality. IMPRESSION: No active disease. Electronically Signed   By: Placido Sou M.D.   On: 03/14/2022 19:12   CT Head Wo Contrast  Result Date: 03/14/2022 CLINICAL DATA:  Altered mental status EXAM: CT HEAD WITHOUT CONTRAST TECHNIQUE: Contiguous axial images were obtained from the base of the skull through the vertex without intravenous contrast. RADIATION DOSE REDUCTION: This exam was performed according to the departmental dose-optimization program which includes automated exposure control, adjustment of the mA and/or kV according to patient size and/or use of iterative reconstruction technique. COMPARISON:  09/06/2020 FINDINGS: Brain: There is no mass, hemorrhage or extra-axial collection. The size and configuration of the ventricles and extra-axial CSF spaces are normal. There is hypoattenuation of the white matter, most commonly indicating chronic small vessel disease. Vascular: No abnormal hyperdensity of the major intracranial arteries or dural venous sinuses. No intracranial atherosclerosis. Skull: Remote left craniotomy Sinuses/Orbits: No fluid levels or advanced mucosal thickening of the visualized paranasal sinuses. No mastoid or middle ear effusion. The orbits are normal.  IMPRESSION: Chronic small vessel disease without acute intracranial abnormality. Electronically Signed   By: Ulyses Jarred M.D.   On: 03/14/2022 19:04    Scheduled Meds:  apixaban  2.5 mg Oral BID   carbidopa-levodopa  1 tablet Oral QHS   carbidopa-levodopa  1 tablet Oral QID   rasagiline  1 mg Oral q1800   Continuous Infusions:  sodium chloride 75 mL/hr at 03/15/22 0344   cefTRIAXone (ROCEPHIN)  IV Stopped (03/15/22 0721)     LOS: 0 days   Time spent: 4mn  Finnley Larusso C Chantal Worthey, DO [facility]  If 7PM-7AM, please contact  night-coverage [email or web address]  03/15/2022, 8:16 AM

## 2022-03-16 DIAGNOSIS — R4182 Altered mental status, unspecified: Secondary | ICD-10-CM | POA: Diagnosis not present

## 2022-03-16 DIAGNOSIS — R259 Unspecified abnormal involuntary movements: Secondary | ICD-10-CM

## 2022-03-16 DIAGNOSIS — R410 Disorientation, unspecified: Secondary | ICD-10-CM | POA: Diagnosis not present

## 2022-03-16 DIAGNOSIS — R569 Unspecified convulsions: Secondary | ICD-10-CM | POA: Diagnosis not present

## 2022-03-16 LAB — URINE CULTURE: Culture: <10000 — AB

## 2022-03-16 LAB — BASIC METABOLIC PANEL
Anion gap: 10 (ref 5–15)
BUN: 27 mg/dL — ABNORMAL HIGH (ref 8–23)
CO2: 23 mmol/L (ref 22–32)
Calcium: 8.5 mg/dL — ABNORMAL LOW (ref 8.9–10.3)
Chloride: 108 mmol/L (ref 98–111)
Creatinine, Ser: 1.7 mg/dL — ABNORMAL HIGH (ref 0.61–1.24)
GFR, Estimated: 38 mL/min — ABNORMAL LOW (ref >60)
Glucose, Bld: 104 mg/dL — ABNORMAL HIGH (ref 70–99)
Potassium: 3.6 mmol/L (ref 3.5–5.1)
Sodium: 141 mmol/L (ref 135–145)

## 2022-03-16 LAB — CBC
HCT: 40 % (ref 39.0–52.0)
Hemoglobin: 13.4 g/dL (ref 13.0–17.0)
MCH: 29.9 pg (ref 26.0–34.0)
MCHC: 33.5 g/dL (ref 30.0–36.0)
MCV: 89.3 fL (ref 80.0–100.0)
Platelets: 159 10*3/uL (ref 150–400)
RBC: 4.48 MIL/uL (ref 4.22–5.81)
RDW: 14.6 % (ref 11.5–15.5)
WBC: 8.3 10*3/uL (ref 4.0–10.5)
nRBC: 0 % (ref 0.0–0.2)

## 2022-03-16 MED ORDER — LEVOTHYROXINE SODIUM 100 MCG PO TABS
100.0000 ug | ORAL_TABLET | Freq: Every day | ORAL | Status: DC
  Administered 2022-03-17: 100 ug via ORAL
  Filled 2022-03-16: qty 1

## 2022-03-16 MED ORDER — CARBIDOPA-LEVODOPA 25-100 MG PO TABS
1.0000 | ORAL_TABLET | Freq: Four times a day (QID) | ORAL | Status: DC
  Administered 2022-03-16 – 2022-03-17 (×4): 1 via ORAL
  Filled 2022-03-16 (×4): qty 1

## 2022-03-16 NOTE — Progress Notes (Signed)
Neurology Progress Note  Brief HPI: William [name] is an 88 y.o. male with a PMHx of right TIA in January 2003, SDH (s/p left craniotomy in August 2006), hypothyroidism, thyroid cancer, partial onset seizures in the past (off Dilantin), lumbar spine disease (s/p L spine surgery at L4-5 in January 2014), renal tumor (s/p ablative surgery in March 2014), atrial fibrillation on anticoagulation, BPH with urinary obstruction, DJD, gout, HLD, HTN, CKD3b, medullary carcinoma of the thyroid and [name] (on Sinemet and rasagiline, followed at [facility]) who presented to the ED via EMS on Thursday evening from [facility] for evaluation of seizure-like activity consisting of "arms and legs flailing all over" lasting for 30 minutes before spontaneously resolving.   He was seen by Dr. Rexene Alberts with [facility] on 11/29  Subjective: Patient seen in room.   Exam: Vitals:   03/16/22 0902 03/16/22 1626  BP: (!) 182/92 (!) 181/87  Pulse: 74 64  Resp:  18  Temp: 98.3 F (36.8 C) (!) 97.5 F (36.4 C)  SpO2: 100% 96%   Gen: In bed, NAD Resp: non-labored breathing, no acute distress Abd: soft, nt  Neuro: Mental Status: Alert to self, unable to tell me date or location.  Speech is very dysarthric and difficult to understand, at his baseline per wife at bedside Cranial Nerves: Tracks examiner in all directions, hearing intact to voice, face symmetric, tongue midline Motor: Tone is increased. Myoclonus in right lower extremity.  Uses all 4 extremities equally Sensory: Equally reactive to light touch in all 4 extremities  Pertinent Labs: Cr 1.89 Troponin 45 UA- leukocytes- small, bacteria few   Imaging Reviewed: EEG- 03/14/2022- This study is suggestive of cortical dysfunction in right frontal parietal region likely secondary to underlying craniotomy.  No seizures or definite epileptiform discharges were seen throughout the recording.   Assessment: William [name] is an 88 y.o. male with a PMHx of  right TIA in January 2003, SDH (s/p left craniotomy in August 2006), hypothyroidism, thyroid cancer, partial onset seizures in the past (off Dilantin), lumbar spine disease (s/p L spine surgery at L4-5 in January 2014), renal tumor (s/p ablative surgery in March 2014), atrial fibrillation on anticoagulation, BPH with urinary obstruction, DJD, gout, HLD, HTN, CKD3b, medullary carcinoma of the thyroid and [name] (on Sinemet and rasagiline, followed at [facility]) who presented to the ED via EMS on Thursday evening from [facility] for evaluation of seizure-like activity consisting of "arms and legs flailing all over" lasting for 30 minutes before spontaneously resolving.  He takes sinemet 4x per day and the CR sinemet at bedtime. He states he thinks the times are 0800, 1200, 1600, 2000, 2200 (this is confirmed by wife)  Impression: Episode of altered consciousness and agitation with flailing limb movements, unlikely to be seizure based on description with EEG negative todate  Recommendations: - Discontinue long-term EEG monitoring - Sinemet retimed for home timings of 8 AM and noon; did not change timings for the evening doses given he had already received an afternoon dose recently and did not want to skip or give an extra dose this evening - Patient stable for discharge early tomorrow morning, from a neurological perspective, no further inpatient workup planned - Continue outpatient follow-up with Dr. Rexene Alberts, inpatient neurology will be available as needed going forward  Lesleigh Noe MD-PhD [facility] [phone]  Greater than 35 minutes were spent in care of this patient today, greater than 50% at bedside

## 2022-03-16 NOTE — Procedures (Addendum)
Patient Name: William [name]  MRN: [id number]  Epilepsy Attending: Lora Havens  Referring Physician/Provider: Kerney Elbe, MD 3  Duration: 03/15/2022 0853 to 03/16/2022 1630   Patient history: 88 year old male presenting with his second episode in 3 weeks of thrashing limb movements associated with decreased level of consciousness, which resolved after 30 minutes. EEG to evaluate for seizure.    Level of alertness: Awake, asleep   AEDs during EEG study: None   Technical aspects: This EEG study was done with scalp electrodes positioned according to the 10-20 International system of electrode placement. Electrical activity was reviewed with band pass filter of 1-'70Hz'$ , sensitivity of 7 uV/mm, display speed of 48m/sec with a '60Hz'$  notched filter applied as appropriate. EEG data were recorded continuously and digitally stored.  Video monitoring was available and reviewed as appropriate.   Description: The posterior dominant rhythm consists of 7 Hz activity of moderate voltage (25-35 uV) seen predominantly in posterior head regions, symmetric and reactive to eye opening and eye closing. Sleep was characterized by sleep spindles ( 12-'14hz'$ ), maximal fronto-central region. EEG showed continuous 3 to 5 Hz theta-delta slowing which appeared sharply contoured in left fronto-parietal region consistent with breach artifact.  Hyperventilation and photic stimulation were not performed.     Study was disconnected between 03/15/2022 1928 to 03/16/2022 0247   ABNORMALITY -Breach artifact, left frontal parietal region   IMPRESSION: This study is suggestive of cortical dysfunction in left fronto-parietal region likely secondary to underlying craniotomy.  No seizures or definite epileptiform discharges were seen throughout the recording.   Delia Sitar OBarbra Sarks

## 2022-03-16 NOTE — Progress Notes (Signed)
LTM EEG discontinued - no skin breakdown at unhook.   

## 2022-03-16 NOTE — Plan of Care (Signed)
  Problem: Health Behavior/Discharge Planning: Goal: Ability to manage health-related needs will improve Outcome: Progressing   Problem: Nutrition: Goal: Adequate nutrition will be maintained Outcome: Progressing   Problem: Elimination: Goal: Will not experience complications related to bowel motility Outcome: Progressing

## 2022-03-16 NOTE — Progress Notes (Signed)
PROGRESS NOTE    William [name]  WNI:[id number] DOB: 05-03-1928 DOA: 03/14/2022 PCP: [place], [place]   Brief Narrative:  William [name] is a 88 y.o. male with a past medical history of paroxysmal atrial fibrillation on anticoagulation, hypothyroidism, [name] who was in his usual state of health till earlier today when he was noted to have seizure-like activity which lasted about 30 minutes.  Apparently patient was quite somnolent and was difficult to be woken up by his significant other.  This was followed by onset of the seizure-like activity.  Apparently patient was shaking his arms and legs.  Admitted for further workup given concern for seizure-like activity, rule out infectious process polypharmacy or other clear indications of mental status changes.   Assessment & Plan:   Principal Problem:   AMS (altered mental status) Active Problems:   Essential hypertension   Parkinson disease, symptomatic (HCC)   PAF (paroxysmal atrial fibrillation) ([place])   Long term current use of anticoagulant therapy   UTI (urinary tract infection)   Seizure (HCC)   Acute encephalopathy, metabolic versus toxic Rule out seizure-like activity -Mental status improving with supportive care although not yet back to baseline -Continuous EEG ongoing but negative thus far for seizure/epileptiform activity -CT head unremarkable -Neurology following, appreciate insight and recommendations. -UA abnormal concerning for UTI, see below   Possible UTI Patient does not meet sepsis criteria  Continue 3 days of ceftriaxone, follow cultures to ensure sensitivity Patient is on nitrofurantoin chronically due to recurrent chronic UTIs.    AKI without history of CKD  -Continue supportive care, IV fluids, advance diet as tolerated -Follow repeat labs, monitor ins and outs  Hypertensive emergency:  -Complicating mental status changes as above, may be secondary to profound  hypertension -Follow repeat vitals, no home medications noted, may titrate medications here if necessary.  [name]: Followed by [facility].   Continue with Sinemet. Therapy to follow   Paroxysmal atrial fibrillation:  Not currently on rate limiting medications, continue Eliquis   DVT prophylaxis: Eliquis 2.5 twice daily given age and weight Code Status: DNR Family Communication: Wife at bedside  Status is: Inpatient  Dispo: The patient is from: Home              Anticipated d/c is to: To be determined              Anticipated d/c date is: 48 to 72 hours              Patient currently not medically stable for discharge  Consultants:  Neurology  Procedures:  None  Antimicrobials:  Ceftriaxone x 3 days  Subjective: Patient in restraints overnight agitated pulling at wires and tubes, poor sleep likely making his mental status more tenuous.  Wife at bedside concerned over mittens which will be removed this morning if she remains at bedside to ensure he does not continue to pull at EEG wires or condom cath.  Objective: Vitals:   03/15/22 1927 03/15/22 2002 03/15/22 2339 03/16/22 0432  BP: (!) 218/123 (!) 134/98 100/61 (!) 186/114  Pulse:  81 64 76  Resp:  '17 17 18  '$ Temp:  98.5 F (36.9 C) 97.6 F (36.4 C) 97.9 F (36.6 C)  TempSrc:  Oral Oral   SpO2:  95% 92% 97%  Weight:   76.5 kg   Height:        Intake/Output Summary (Last 24 hours) at 03/16/2022 0745 Last data filed at 03/16/2022 0500 Gross  per 24 hour  Intake 1395.02 ml  Output 1525 ml  Net -129.98 ml   Filed Weights   03/14/22 1757 03/31/22 2339  Weight: 74 kg 76.5 kg    Examination:  General:  Pleasantly resting in bed, No acute distress.  Oriented to person place and general situation, sluggish to respond to simple questions and commands HEENT:  Normocephalic atraumatic.  Sclerae nonicteric, noninjected.  Extraocular movements intact bilaterally. Neck:  Without mass or  deformity.  Trachea is midline. Lungs:  Clear to auscultate bilaterally without rhonchi, wheeze, or rales. Heart: Irregularly irregular without murmurs rubs or gallops Abdomen:  Soft, nontender, nondistended.  Without guarding or rebound. Extremities: Without cyanosis, clubbing, edema, or obvious deformity.  Data Reviewed: I have personally reviewed following labs and imaging studies  CBC: Recent Labs  Lab 03/14/22 1811 2022/03/31 0507 03/16/22 0711  WBC 6.4 5.8 8.3  NEUTROABS 5.0  --   --   HGB 13.8 13.6 13.4  HCT 43.1 43.2 40.0  MCV 92.9 92.7 89.3  PLT 181 174 163    Basic Metabolic Panel: Recent Labs  Lab 03/14/22 1811 2022/03/31 0507  NA 144 142  K 4.0 4.1  CL 104 104  CO2 24 26  GLUCOSE 101* 113*  BUN 35* 31*  CREATININE 2.01* 1.89*  CALCIUM 8.7* 8.5*  MG 2.2  --     GFR: Estimated Creatinine Clearance: 27.5 mL/min (A) (by C-G formula based on SCr of 1.89 mg/dL (H)). Liver Function Tests: Recent Labs  Lab 03/14/22 1811  AST 18  ALT 6  ALKPHOS 60  BILITOT 0.6  PROT 6.9  ALBUMIN 4.2    No results for input(s): "LIPASE", "AMYLASE" in the last 168 hours. No results for input(s): "AMMONIA" in the last 168 hours. Coagulation Profile: No results for input(s): "INR", "PROTIME" in the last 168 hours. Cardiac Enzymes: No results for input(s): "CKTOTAL", "CKMB", "CKMBINDEX", "TROPONINI" in the last 168 hours. BNP (last 3 results) No results for input(s): "PROBNP" in the last 8760 hours. HbA1C: No results for input(s): "HGBA1C" in the last 72 hours. CBG: Recent Labs  Lab 03/14/22 1752  GLUCAP 91    Lipid Profile: No results for input(s): "CHOL", "HDL", "LDLCALC", "TRIG", "CHOLHDL", "LDLDIRECT" in the last 72 hours. Thyroid Function Tests: No results for input(s): "TSH", "T4TOTAL", "FREET4", "T3FREE", "THYROIDAB" in the last 72 hours. Anemia Panel: No results for input(s): "VITAMINB12", "FOLATE", "FERRITIN", "TIBC", "IRON", "RETICCTPCT" in the last 72  hours. Sepsis Labs: No results for input(s): "PROCALCITON", "LATICACIDVEN" in the last 168 hours.  No results found for this or any previous visit (from the past 240 hour(s)).       Radiology Studies: EEG adult  Result Date: 2022-03-31 Lora Havens, MD     03/31/2022  9:49 AM Patient Name: ZACKARY MCKEONE MRN: [id number] Epilepsy Attending: Lora Havens Referring Physician/Provider: Kerney Elbe, MD Date: 03/14/2022 Duration: 23.43 mins Patient history: 88 year old male presenting with his second episode in 3 weeks of thrashing limb movements associated with decreased level of consciousness, which resolved after 30 minutes. EEG to evaluate for seizure. Level of alertness: Awake AEDs during EEG study: None Technical aspects: This EEG study was done with scalp electrodes positioned according to the 10-20 International system of electrode placement. Electrical activity was reviewed with band pass filter of 1-'70Hz'$ , sensitivity of 7 uV/mm, display speed of 68m/sec with a '60Hz'$  notched filter applied as appropriate. EEG data were recorded continuously and digitally stored.  Video monitoring was available and  reviewed as appropriate. Description: The posterior dominant rhythm consists of 7 Hz activity of moderate voltage (25-35 uV) seen predominantly in posterior head regions, symmetric and reactive to eye opening and eye closing.  EEG showed continuous 3 to 5 Hz theta-delta slowing which appeared sharply contoured in left fronto-parietal region consistent with breach artifact.  Hyperventilation and photic stimulation were not performed.   ABNORMALITY -Breach artifact, left frontal parietal region IMPRESSION: This study is suggestive of cortical dysfunction in left fronto-parietal region likely secondary to underlying craniotomy.  No seizures or definite epileptiform discharges were seen throughout the recording. Please note that lack of epileptiform activity does not exclude the diagnosis of  epilepsy. Lora Havens   DG Chest Port 1 View  Result Date: 03/14/2022 CLINICAL DATA:  Seizure-like activity and altered mental status EXAM: PORTABLE CHEST 1 VIEW COMPARISON:  Radiographs 11/25/2021 FINDINGS: Stable cardiomediastinal silhouette. Aortic atherosclerotic calcification. Bibasilar atelectasis/scarring. No focal pneumonia, pleural effusion, or pneumothorax. No acute osseous abnormality. IMPRESSION: No active disease. Electronically Signed   By: Placido Sou M.D.   On: 03/14/2022 19:12   CT Head Wo Contrast  Result Date: 03/14/2022 CLINICAL DATA:  Altered mental status EXAM: CT HEAD WITHOUT CONTRAST TECHNIQUE: Contiguous axial images were obtained from the base of the skull through the vertex without intravenous contrast. RADIATION DOSE REDUCTION: This exam was performed according to the departmental dose-optimization program which includes automated exposure control, adjustment of the mA and/or kV according to patient size and/or use of iterative reconstruction technique. COMPARISON:  09/06/2020 FINDINGS: Brain: There is no mass, hemorrhage or extra-axial collection. The size and configuration of the ventricles and extra-axial CSF spaces are normal. There is hypoattenuation of the white matter, most commonly indicating chronic small vessel disease. Vascular: No abnormal hyperdensity of the major intracranial arteries or dural venous sinuses. No intracranial atherosclerosis. Skull: Remote left craniotomy Sinuses/Orbits: No fluid levels or advanced mucosal thickening of the visualized paranasal sinuses. No mastoid or middle ear effusion. The orbits are normal. IMPRESSION: Chronic small vessel disease without acute intracranial abnormality. Electronically Signed   By: Ulyses Jarred M.D.   On: 03/14/2022 19:04    Scheduled Meds:  apixaban  2.5 mg Oral BID   carbidopa-levodopa  1 tablet Oral QHS   carbidopa-levodopa  1 tablet Oral QID   rasagiline  1 mg Oral q1800   Continuous  Infusions:  cefTRIAXone (ROCEPHIN)  IV 1 g (03/15/22 2306)     LOS: 1 day   Time spent: 43mn  Masako Overall C Yomira Flitton, DO [facility]  If 7PM-7AM, please contact night-coverage [email or web address]  03/16/2022, 7:45 AM

## 2022-03-16 NOTE — Progress Notes (Signed)
MB performed maintenance on electrodes. All impedances are below 10k ohms. No skin breakdown noted.  

## 2022-03-17 DIAGNOSIS — R4182 Altered mental status, unspecified: Secondary | ICD-10-CM | POA: Diagnosis not present

## 2022-03-17 MED ORDER — LISINOPRIL 10 MG PO TABS
10.0000 mg | ORAL_TABLET | Freq: Every day | ORAL | Status: DC
  Filled 2022-03-17: qty 1

## 2022-03-17 MED ORDER — LISINOPRIL 10 MG PO TABS: 10.0000 mg | ORAL_TABLET | Freq: Every day | ORAL | 1 refills | Status: DC

## 2022-03-17 MED ORDER — AMLODIPINE BESYLATE 10 MG PO TABS: 10.0000 mg | ORAL_TABLET | Freq: Every day | ORAL | 1 refills | Status: DC

## 2022-03-17 MED ORDER — AMLODIPINE BESYLATE 10 MG PO TABS
10.0000 mg | ORAL_TABLET | Freq: Every day | ORAL | Status: DC
  Administered 2022-03-17: 10 mg via ORAL
  Filled 2022-03-17: qty 1

## 2022-03-17 NOTE — Discharge Summary (Signed)
Physician Discharge Summary  William [name]:[id number] DOB: 06-18-1928 DOA: 03/14/2022  PCP: [facility] date: 03/14/2022 Discharge date: 03/17/2022  Admitted From: Home Disposition: Home  Recommendations for Outpatient Follow-up:  Follow up with PCP in 1-2 weeks Follow-up with neurology, Dr. Rexene Alberts as scheduled  Discharge Condition: Stable CODE STATUS: DNR Diet recommendation: As tolerated  Brief/Interim Summary: William [name] is a 88 y.o. male with a past medical history of paroxysmal atrial fibrillation on anticoagulation, hypothyroidism, [name] who was in his usual state of health till earlier today when he was noted to have seizure-like activity which lasted about 30 minutes.  Apparently patient was quite somnolent and was difficult to be woken up by his significant other.  This was followed by onset of the seizure-like activity.  Apparently patient was shaking his arms and legs.  Admitted for further workup given concern for seizure-like activity, rule out infectious process polypharmacy or other clear indications of mental status changes.    Patient admitted as above with acute toxic versus metabolic encephalopathy.  Neurology was consulted at intake, continuous EEG over the past 48 hours remains negative.  No further signs or symptoms of stroke given negative imaging.  Patient's mental status appears to be back to baseline.  Patient did have questionable UTI given abnormal UA noted to be on Macrobid chronically due to recurrent UTIs, treated here with 3 days of ceftriaxone.  Otherwise patient had mild AKI which has resolved with IV fluids and increase p.o. intake, markedly elevated blood pressure has continued to improve, additional amlodipine and lisinopril prescribed at discharge to more appropriately manage patient's blood pressure.  Otherwise he is stable and agreeable for discharge home, wife at bedside agrees he is back to baseline and  will take the patient home today.  Recommend close follow-up with PCP in 1 to 2 weeks for repeat evaluation and labs as indicated.  Discharge Diagnoses:  Principal Problem:   AMS (altered mental status) Active Problems:   Essential hypertension   Parkinson disease, symptomatic (HCC)   PAF (paroxysmal atrial fibrillation) ([place])   Long term current use of anticoagulant therapy   UTI (urinary tract infection)   Seizure (HCC)   Acute encephalopathy, metabolic versus toxic, resolved Seizures ruled out -Mental status improving with supportive care -appears to be back to baseline -Continuous EEG completed - remains negative -CT head unremarkable -Neurology will follow outpatient - Dr Rexene Alberts, appreciate insight and recommendations. -UA abnormal concerning for UTI, see below   Possible UTI Patient does not meet sepsis criteria  Completed abx course - resume nitrofurantoin chronically due to recurrent chronic UTIs.     AKI without history of CKD  Continue increased PO intake   Hypertensive emergency:  -Complicating mental status changes as above, may be secondary to profound hypertension -Increased amlodipine and lisinopril dosing, wife is declining lisinopril as she states " I do not want his blood pressure too low" -his most recent blood pressure reading was 179/78.  We discussed that this is likely too high but she would like to follow-up with her PCP which is certainly reasonable.   [name]: Followed by [facility].   Continue with Sinemet. Therapy to follow   Paroxysmal atrial fibrillation:  Not currently on rate limiting medications, continue Eliquis    Discharge Instructions   Allergies as of 03/17/2022   No Known Allergies      Medication List     TAKE these medications    Align  4 MG Caps Take 1 capsule by mouth daily.   amLODipine 10 MG tablet Commonly known as: NORVASC Take 1 tablet (10 mg total) by mouth daily. Start taking  on: March 18, 2022   apixaban 2.5 MG Tabs tablet Commonly known as: Eliquis Take 1 tablet (2.5 mg total) by mouth 2 (two) times daily.   carbidopa-levodopa 50-200 MG tablet Commonly known as: SINEMET CR Take 1 tablet by mouth at bedtime. Administer at 10 pm   carbidopa-levodopa 25-100 MG tablet Commonly known as: SINEMET IR Take 1 tablet by mouth 4 times daily. (At 8AM, 12PM, 4PM and 8PM daily)   levothyroxine 100 MCG tablet Commonly known as: SYNTHROID TAKE 1 TABLET DAILY ON AN  EMPTY STOMACH WITH A FULL  GLASS OF WATER What changed:  how much to take how to take this when to take this   lisinopril 10 MG tablet Commonly known as: ZESTRIL Take 1 tablet (10 mg total) by mouth daily.   nitrofurantoin 50 MG capsule Commonly known as: MACRODANTIN Take 50 mg by mouth at bedtime.   nitroGLYCERIN 0.4 MG SL tablet Commonly known as: NITROSTAT Place 1 tablet (0.4 mg total) under the tongue every 5 (five) minutes x 3 doses as needed for chest pain.   rasagiline 1 MG Tabs tablet Commonly known as: AZILECT 1 daily administer at 6 pm   simvastatin 40 MG tablet Commonly known as: ZOCOR TAKE 1 TABLET AT BEDTIME   VITA-C PO Take 1 tablet by mouth every morning.   VITAMIN E PO Take 1 tablet by mouth every morning.        No Known Allergies  Consultations: Neurology   Procedures/Studies: Overnight EEG with video  Result Date: 03/16/2022 Lora Havens, MD     03/16/2022  4:29 PM Patient Name: William [name] MRN: [id number] Epilepsy Attending: Lora Havens Referring Physician/Provider: Kerney Elbe, MD 3 Duration: 03/15/2022 0853 to 03/16/2022 1630  Patient history: 88 year old male presenting with his second episode in 3 weeks of thrashing limb movements associated with decreased level of consciousness, which resolved after 30 minutes. EEG to evaluate for seizure.  Level of alertness: Awake, asleep  AEDs during EEG study: None  Technical aspects: This EEG study was  done with scalp electrodes positioned according to the 10-20 International system of electrode placement. Electrical activity was reviewed with band pass filter of 1-'70Hz'$ , sensitivity of 7 uV/mm, display speed of 46m/sec with a '60Hz'$  notched filter applied as appropriate. EEG data were recorded continuously and digitally stored.  Video monitoring was available and reviewed as appropriate.  Description: The posterior dominant rhythm consists of 7 Hz activity of moderate voltage (25-35 uV) seen predominantly in posterior head regions, symmetric and reactive to eye opening and eye closing. Sleep was characterized by sleep spindles ( 12-'14hz'$ ), maximal fronto-central region. EEG showed continuous 3 to 5 Hz theta-delta slowing which appeared sharply contoured in left fronto-parietal region consistent with breach artifact.  Hyperventilation and photic stimulation were not performed.   Study was disconnected between 03/15/2022 1928 to 03/16/2022 0247  ABNORMALITY -Breach artifact, left frontal parietal region  IMPRESSION: This study is suggestive of cortical dysfunction in left fronto-parietal region likely secondary to underlying craniotomy.  No seizures or definite epileptiform discharges were seen throughout the recording.  PLora Havens   EEG adult  Result Date: 03/15/2022 YLora Havens MD     03/15/2022  9:49 AM Patient Name: JDOYL BITTINGMRN: [id number] Attending: PLora HavensReferring  Physician/Provider: Kerney Elbe, MD Date: 03/14/2022 Duration: 23.43 mins Patient history: 88 year old male presenting with his second episode in 3 weeks of thrashing limb movements associated with decreased level of consciousness, which resolved after 30 minutes. EEG to evaluate for seizure. Level of alertness: Awake AEDs during EEG study: None Technical aspects: This EEG study was done with scalp electrodes positioned according to the 10-20 International system of electrode placement. Electrical activity  was reviewed with band pass filter of 1-'70Hz'$ , sensitivity of 7 uV/mm, display speed of 27m/sec with a '60Hz'$  notched filter applied as appropriate. EEG data were recorded continuously and digitally stored.  Video monitoring was available and reviewed as appropriate. Description: The posterior dominant rhythm consists of 7 Hz activity of moderate voltage (25-35 uV) seen predominantly in posterior head regions, symmetric and reactive to eye opening and eye closing.  EEG showed continuous 3 to 5 Hz theta-delta slowing which appeared sharply contoured in left fronto-parietal region consistent with breach artifact.  Hyperventilation and photic stimulation were not performed.   ABNORMALITY -Breach artifact, left frontal parietal region IMPRESSION: This study is suggestive of cortical dysfunction in left fronto-parietal region likely secondary to underlying craniotomy.  No seizures or definite epileptiform discharges were seen throughout the recording. Please note that lack of epileptiform activity does not exclude the diagnosis of epilepsy. PLora Havens  DG Chest Port 1 View  Result Date: 03/14/2022 CLINICAL DATA:  Seizure-like activity and altered mental status EXAM: PORTABLE CHEST 1 VIEW COMPARISON:  Radiographs 11/25/2021 FINDINGS: Stable cardiomediastinal silhouette. Aortic atherosclerotic calcification. Bibasilar atelectasis/scarring. No focal pneumonia, pleural effusion, or pneumothorax. No acute osseous abnormality. IMPRESSION: No active disease. Electronically Signed   By: TPlacido SouM.D.   On: 03/14/2022 19:12   CT Head Wo Contrast  Result Date: 03/14/2022 CLINICAL DATA:  Altered mental status EXAM: CT HEAD WITHOUT CONTRAST TECHNIQUE: Contiguous axial images were obtained from the base of the skull through the vertex without intravenous contrast. RADIATION DOSE REDUCTION: This exam was performed according to the departmental dose-optimization program which includes automated exposure control,  adjustment of the mA and/or kV according to patient size and/or use of iterative reconstruction technique. COMPARISON:  09/06/2020 FINDINGS: Brain: There is no mass, hemorrhage or extra-axial collection. The size and configuration of the ventricles and extra-axial CSF spaces are normal. There is hypoattenuation of the white matter, most commonly indicating chronic small vessel disease. Vascular: No abnormal hyperdensity of the major intracranial arteries or dural venous sinuses. No intracranial atherosclerosis. Skull: Remote left craniotomy Sinuses/Orbits: No fluid levels or advanced mucosal thickening of the visualized paranasal sinuses. No mastoid or middle ear effusion. The orbits are normal. IMPRESSION: Chronic small vessel disease without acute intracranial abnormality. Electronically Signed   By: KUlyses JarredM.D.   On: 03/14/2022 19:04     Subjective: No acute issues or events overnight, back to baseline otherwise requesting discharge home, wife at bedside agreeable.   Discharge Exam: Vitals:   03/17/22 0543 03/17/22 0915  BP: (!) 180/87 (!) 179/78  Pulse: 70 62  Resp: 16 18  Temp: 98.6 F (37 C) 98 F (36.7 C)  SpO2: 96% 97%   Vitals:   03/16/22 1626 03/16/22 2056 03/17/22 0543 03/17/22 0915  BP: (!) 181/87 (!) 195/100 (!) 180/87 (!) 179/78  Pulse: 64 67 70 62  Resp: '18 16 16 18  '$ Temp: (!) 97.5 F (36.4 C) 98.4 F (36.9 C) 98.6 F (37 C) 98 F (36.7 C)  TempSrc: Oral  SpO2: 96% 99% 96% 97%  Weight:      Height:        General: Pt is alert, awake, not in acute distress Cardiovascular: RRR, S1/S2 +, no rubs, no gallops Respiratory: CTA bilaterally, no wheezing, no rhonchi Abdominal: Soft, NT, ND, bowel sounds + Extremities: no edema, no cyanosis   The results of significant diagnostics from this hospitalization (including imaging, microbiology, ancillary and laboratory) are listed below for reference.     Microbiology: Recent Results (from the past 240 hour(s))   Urine Culture     Status: Abnormal   Collection Time: 03/14/22 11:49 PM   Specimen: Urine, Clean Catch  Result Value Ref Range Status   Specimen Description URINE, CLEAN CATCH  Final   Special Requests NONE  Final   Culture (A)  Final    <10,000 COLONIES/mL INSIGNIFICANT GROWTH Performed at [facility], [address], [place], [place] [address]    Report Status 03/16/2022 FINAL  Final     Labs: BNP (last 3 results) No results for input(s): "BNP" in the last 8760 hours. Basic Metabolic Panel: Recent Labs  Lab 03/14/22 1811 03/15/22 0507 03/16/22 0711  NA 144 142 141  K 4.0 4.1 3.6  CL 104 104 108  CO2 '24 26 23  '$ GLUCOSE 101* 113* 104*  BUN 35* 31* 27*  CREATININE 2.01* 1.89* 1.70*  CALCIUM 8.7* 8.5* 8.5*  MG 2.2  --   --    Liver Function Tests: Recent Labs  Lab 03/14/22 1811  AST 18  ALT 6  ALKPHOS 60  BILITOT 0.6  PROT 6.9  ALBUMIN 4.2   No results for input(s): "LIPASE", "AMYLASE" in the last 168 hours. No results for input(s): "AMMONIA" in the last 168 hours. CBC: Recent Labs  Lab 03/14/22 1811 03/15/22 0507 03/16/22 0711  WBC 6.4 5.8 8.3  NEUTROABS 5.0  --   --   HGB 13.8 13.6 13.4  HCT 43.1 43.2 40.0  MCV 92.9 92.7 89.3  PLT 181 174 159   Cardiac Enzymes: No results for input(s): "CKTOTAL", "CKMB", "CKMBINDEX", "TROPONINI" in the last 168 hours. BNP: Invalid input(s): "POCBNP" CBG: Recent Labs  Lab 03/14/22 1752  GLUCAP 91   D-Dimer No results for input(s): "DDIMER" in the last 72 hours. Hgb A1c No results for input(s): "HGBA1C" in the last 72 hours. Lipid Profile No results for input(s): "CHOL", "HDL", "LDLCALC", "TRIG", "CHOLHDL", "LDLDIRECT" in the last 72 hours. Thyroid function studies No results for input(s): "TSH", "T4TOTAL", "T3FREE", "THYROIDAB" in the last 72 hours.  Invalid input(s): "FREET3" Anemia work up No results for input(s): "VITAMINB12", "FOLATE", "FERRITIN", "TIBC", "IRON", "RETICCTPCT" in the last 72  hours. Urinalysis    Component Value Date/Time   COLORURINE YELLOW 03/14/2022 1952   APPEARANCEUR HAZY (A) 03/14/2022 1952   LABSPEC 1.011 03/14/2022 1952   PHURINE 6.0 03/14/2022 1952   GLUCOSEU NEGATIVE 03/14/2022 1952   GLUCOSEU NEGATIVE 10/28/2008 0948   HGBUR NEGATIVE 03/14/2022 1952   BILIRUBINUR NEGATIVE 03/14/2022 1952   BILIRUBINUR negative 04/30/2018 1403   KETONESUR 5 (A) 03/14/2022 1952   PROTEINUR 30 (A) 03/14/2022 1952   UROBILINOGEN 0.2 04/30/2018 1403   UROBILINOGEN 0.2 08/18/2014 1305   NITRITE NEGATIVE 03/14/2022 1952   LEUKOCYTESUR SMALL (A) 03/14/2022 1952   Sepsis Labs Recent Labs  Lab 03/14/22 1811 03/15/22 0507 03/16/22 0711  WBC 6.4 5.8 8.3   Microbiology Recent Results (from the past 240 hour(s))  Urine Culture     Status: Abnormal  Collection Time: 03/14/22 11:49 PM   Specimen: Urine, Clean Catch  Result Value Ref Range Status   Specimen Description URINE, CLEAN CATCH  Final   Special Requests NONE  Final   Culture (A)  Final    <10,000 COLONIES/mL INSIGNIFICANT GROWTH Performed at [facility], [address] [place], [place] [address]    Report Status 03/16/2022 FINAL  Final     Time coordinating discharge: Over 30 minutes  SIGNED:   Little Ishikawa, DO [facility] 03/17/2022, 11:50 AM Pager   If 7PM-7AM, please contact night-coverage [email or web address]

## 2022-03-17 NOTE — Progress Notes (Signed)
DISCHARGE NOTE HOME DMITRI PETTIGREW to be discharged Home per MD order. Discussed prescriptions and follow up appointments with the patient. Prescriptions given to patient; medication list explained in detail. Patient verbalized understanding.  Skin clean, dry and intact without evidence of skin break down, no evidence of skin tears noted. IV catheter discontinued intact. Site without signs and symptoms of complications. Dressing and pressure applied. Pt denies pain at the site currently. No complaints noted.  Patient free of lines, drains, and wounds.   An After Visit Summary (AVS) was printed and given to the patient. Patient escorted via wheelchair, and discharged home via private auto.  Keenan Bachelor, RN

## 2022-03-20 ENCOUNTER — Telehealth: Payer: Self-pay | Admitting: Cardiology

## 2022-03-20 NOTE — Telephone Encounter (Signed)
Patient wife, advised that they recently were in the ED (notes in epic) they made some changes to his BP meds and the wife does not agree with doing this. She states they are going to have him go to a facility to get his strength back, but she is afraid they will give him these medications and he does not need it when his BP drops after he eats. She states he has always been this way and wants to verify with Dr.Hochrein that he is still on board with this plan. Recent bp readings below:   82/55 yesterday (no bp meds)  79/55 after eating.  180/? (Last night, she states patient was restless)   122/86 this morning. (No bp meds)

## 2022-03-20 NOTE — Telephone Encounter (Signed)
Pt's wife would like a call back to discuss medication management for patient. She states that she has been keeping up with his BP without giving him a daily BP medication, but he is about to be taken to a skilled nursing unit and she would like to make sure they can continue doing what they have been doing as far as med management is concerned.

## 2022-03-21 ENCOUNTER — Other Ambulatory Visit: Payer: Self-pay

## 2022-03-21 MED ORDER — AMLODIPINE BESYLATE 5 MG PO TABS: 5.0000 mg | ORAL_TABLET | Freq: Every day | ORAL | 1 refills | Status: AC

## 2022-03-21 MED ORDER — LISINOPRIL 5 MG PO TABS: 5.0000 mg | ORAL_TABLET | Freq: Every day | ORAL | 1 refills | Status: AC

## 2022-03-21 NOTE — Telephone Encounter (Signed)
Called wife, advised of note below from [place] Updated medication list. Aware he is moving to a facility, advised that we would be glad to assist in sending over any medications updates if needed.   Thanks!

## 2022-03-22 NOTE — Telephone Encounter (Signed)
Left message for William [name] to call back

## 2022-03-22 NOTE — Telephone Encounter (Signed)
[facility] calling to clarify pt medications

## 2022-03-25 NOTE — Telephone Encounter (Signed)
Attempted to call [facility]lmt[facility].

## 2022-03-25 NOTE — Telephone Encounter (Signed)
William [name] with [employer] is returning call. She states patient was put on Norvasc 10 MG, but wife states he has not been taking it. William [name] mentions that BP is dropping after eating. Over the weekend BP got down to 80/50.

## 2022-03-26 NOTE — Telephone Encounter (Signed)
Angela Nevin adds that patient's wife is very concerned. Angela Nevin provided her cell phone number to ensure that she will be available when her call is returned, [phone].

## 2022-03-26 NOTE — Telephone Encounter (Signed)
Attempted to contact William [name], unable to reach. LVM to call back- left call back number.

## 2022-03-26 NOTE — Telephone Encounter (Signed)
Angela Nevin with [employer] is returning call.

## 2022-03-27 NOTE — Telephone Encounter (Signed)
Left message for William [name], if they continue to have concerns regarding patient blood pressure to please call us back.

## 2022-09-03 ENCOUNTER — Telehealth: Payer: Self-pay | Admitting: Family Medicine

## 2022-09-03 NOTE — Telephone Encounter (Signed)
PATIENT DECEASED

## 2022-09-03 NOTE — Telephone Encounter (Signed)
Patient Deceased 

## 2022-09-04 NOTE — Telephone Encounter (Signed)
Condolence card ready in office for Dr Frances Furbish

## 2022-09-04 NOTE — Telephone Encounter (Signed)
I am sorry to hear this.  Thank you for the notification. 

## 2022-09-10 ENCOUNTER — Telehealth: Payer: Medicare HMO | Admitting: Family Medicine

## 2022-09-11 NOTE — Telephone Encounter (Signed)
Condolence card mailed to [facility].
